# Patient Record
Sex: Male | Born: 1955 | Race: White | Hispanic: No | Marital: Single | Smoking: Former smoker
Health system: Southern US, Community
[De-identification: ages and names within clinical notes are randomized; demographics above are authoritative.]

## PROBLEM LIST (undated history)

## (undated) DIAGNOSIS — F32A Depression, unspecified: Secondary | ICD-10-CM

## (undated) DIAGNOSIS — I509 Heart failure, unspecified: Secondary | ICD-10-CM

## (undated) DIAGNOSIS — D6859 Other primary thrombophilia: Secondary | ICD-10-CM

## (undated) DIAGNOSIS — I639 Cerebral infarction, unspecified: Secondary | ICD-10-CM

## (undated) DIAGNOSIS — I10 Essential (primary) hypertension: Secondary | ICD-10-CM

## (undated) DIAGNOSIS — E785 Hyperlipidemia, unspecified: Secondary | ICD-10-CM

## (undated) DIAGNOSIS — F329 Major depressive disorder, single episode, unspecified: Secondary | ICD-10-CM

## (undated) DIAGNOSIS — K921 Melena: Secondary | ICD-10-CM

## (undated) DIAGNOSIS — D649 Anemia, unspecified: Secondary | ICD-10-CM

## (undated) DIAGNOSIS — S301XXA Contusion of abdominal wall, initial encounter: Secondary | ICD-10-CM

## (undated) DIAGNOSIS — G40909 Epilepsy, unspecified, not intractable, without status epilepticus: Secondary | ICD-10-CM

## (undated) DIAGNOSIS — I82409 Acute embolism and thrombosis of unspecified deep veins of unspecified lower extremity: Secondary | ICD-10-CM

## (undated) DIAGNOSIS — Q211 Atrial septal defect: Secondary | ICD-10-CM

## (undated) DIAGNOSIS — Q2112 Patent foramen ovale: Secondary | ICD-10-CM

## (undated) DIAGNOSIS — D6862 Lupus anticoagulant syndrome: Secondary | ICD-10-CM

## (undated) DIAGNOSIS — F419 Anxiety disorder, unspecified: Secondary | ICD-10-CM

## (undated) HISTORY — DX: Patent foramen ovale: Q21.12

## (undated) HISTORY — DX: Anemia, unspecified: D64.9

## (undated) HISTORY — DX: Morbid (severe) obesity due to excess calories: E66.01

## (undated) HISTORY — DX: Depression, unspecified: F32.A

## (undated) HISTORY — DX: Major depressive disorder, single episode, unspecified: F32.9

## (undated) HISTORY — DX: Hyperlipidemia, unspecified: E78.5

## (undated) HISTORY — DX: Other primary thrombophilia: D68.59

## (undated) HISTORY — DX: Epilepsy, unspecified, not intractable, without status epilepticus: G40.909

## (undated) HISTORY — PX: OTHER SURGICAL HISTORY: SHX169

## (undated) HISTORY — DX: Anxiety disorder, unspecified: F41.9

## (undated) HISTORY — DX: Atrial septal defect: Q21.1

## (undated) HISTORY — DX: Contusion of abdominal wall, initial encounter: S30.1XXA

## (undated) HISTORY — DX: Lupus anticoagulant syndrome: D68.62

## (undated) HISTORY — DX: Melena: K92.1

## (undated) HISTORY — DX: Essential (primary) hypertension: I10

---

## 1988-06-09 DIAGNOSIS — D6862 Lupus anticoagulant syndrome: Secondary | ICD-10-CM

## 1988-06-09 HISTORY — DX: Lupus anticoagulant syndrome: D68.62

## 2001-04-26 ENCOUNTER — Emergency Department (HOSPITAL_COMMUNITY): Admission: EM | Admit: 2001-04-26 | Discharge: 2001-04-26 | Payer: Self-pay

## 2002-03-17 ENCOUNTER — Inpatient Hospital Stay (HOSPITAL_COMMUNITY): Admission: EM | Admit: 2002-03-17 | Discharge: 2002-03-21 | Payer: Self-pay | Admitting: Emergency Medicine

## 2002-03-17 ENCOUNTER — Encounter: Payer: Self-pay | Admitting: Emergency Medicine

## 2003-05-07 ENCOUNTER — Emergency Department (HOSPITAL_COMMUNITY): Admission: EM | Admit: 2003-05-07 | Discharge: 2003-05-07 | Payer: Self-pay | Admitting: Emergency Medicine

## 2008-06-09 DIAGNOSIS — I639 Cerebral infarction, unspecified: Secondary | ICD-10-CM

## 2008-06-09 HISTORY — DX: Cerebral infarction, unspecified: I63.9

## 2008-07-15 ENCOUNTER — Ambulatory Visit: Payer: Self-pay | Admitting: Internal Medicine

## 2008-07-15 ENCOUNTER — Inpatient Hospital Stay (HOSPITAL_COMMUNITY): Admission: EM | Admit: 2008-07-15 | Discharge: 2008-07-31 | Payer: Self-pay | Admitting: Emergency Medicine

## 2008-07-15 ENCOUNTER — Encounter (INDEPENDENT_AMBULATORY_CARE_PROVIDER_SITE_OTHER): Payer: Self-pay | Admitting: General Surgery

## 2008-07-17 ENCOUNTER — Ambulatory Visit: Payer: Self-pay | Admitting: Physical Medicine & Rehabilitation

## 2008-07-18 ENCOUNTER — Encounter (INDEPENDENT_AMBULATORY_CARE_PROVIDER_SITE_OTHER): Payer: Self-pay | Admitting: Neurology

## 2008-07-20 ENCOUNTER — Ambulatory Visit: Payer: Self-pay | Admitting: Vascular Surgery

## 2008-07-20 ENCOUNTER — Encounter (INDEPENDENT_AMBULATORY_CARE_PROVIDER_SITE_OTHER): Payer: Self-pay | Admitting: Neurology

## 2008-08-09 ENCOUNTER — Ambulatory Visit: Payer: Self-pay | Admitting: Oncology

## 2008-08-25 LAB — BETA-2 GLYCOPROTEIN ANTIBODIES
Beta-2-Glycoprotein I IgA: 9 U/mL (ref <10)
Beta-2-Glycoprotein I IgM: <4 U/mL (ref <10)

## 2008-08-25 LAB — LUPUS ANTICOAGULANT PANEL: PTT Lupus Anticoagulant: 39.2 secs (ref 36.3–48.8)

## 2008-08-25 LAB — PROTHROMBIN TIME: INR: 1.7 — ABNORMAL HIGH (ref 0.0–1.5)

## 2008-09-14 ENCOUNTER — Ambulatory Visit (HOSPITAL_COMMUNITY): Admission: RE | Admit: 2008-09-14 | Discharge: 2008-09-14 | Payer: Self-pay | Admitting: Internal Medicine

## 2008-10-04 ENCOUNTER — Ambulatory Visit (HOSPITAL_COMMUNITY): Admission: RE | Admit: 2008-10-04 | Discharge: 2008-10-04 | Payer: Self-pay | Admitting: Internal Medicine

## 2008-11-16 ENCOUNTER — Ambulatory Visit: Payer: Self-pay | Admitting: Oncology

## 2008-11-21 LAB — PROTIME-INR
INR: 2.3 (ref 2.00–3.50)
Protime: 27.6 Seconds — ABNORMAL HIGH (ref 10.6–13.4)

## 2008-11-24 LAB — CARDIOLIPIN ANTIBODIES, IGG, IGM, IGA
Anticardiolipin IgA: 7 [APL'U] (ref <13)
Anticardiolipin IgM: <7 [MPL'U] (ref <10)

## 2008-11-24 LAB — BETA-2 GLYCOPROTEIN ANTIBODIES
Beta-2-Glycoprotein I IgA: 8 U/mL (ref <10)
Beta-2-Glycoprotein I IgM: <4 U/mL (ref <10)

## 2008-11-24 LAB — LUPUS ANTICOAGULANT PANEL: PTT Lupus Anticoagulant: 43.6 secs (ref 36.3–48.8)

## 2009-11-19 ENCOUNTER — Inpatient Hospital Stay (HOSPITAL_COMMUNITY): Admission: EM | Admit: 2009-11-19 | Discharge: 2009-11-21 | Payer: Self-pay | Admitting: Emergency Medicine

## 2010-08-25 LAB — CBC
HCT: 40.4 % (ref 39.0–52.0)
MCHC: 34.2 g/dL (ref 30.0–36.0)
MCV: 91.8 fL (ref 78.0–100.0)
Platelets: 160 10*3/uL (ref 150–400)
RDW: 13.4 % (ref 11.5–15.5)
WBC: 8.3 10*3/uL (ref 4.0–10.5)

## 2010-08-25 LAB — PROTIME-INR: Prothrombin Time: 21.9 seconds — ABNORMAL HIGH (ref 11.6–15.2)

## 2010-08-26 LAB — POCT I-STAT, CHEM 8
BUN: 14 mg/dL (ref 6–23)
Calcium, Ion: 1.1 mmol/L — ABNORMAL LOW (ref 1.12–1.32)
Creatinine, Ser: 1.1 mg/dL (ref 0.4–1.5)
Hemoglobin: 14.6 g/dL (ref 13.0–17.0)
TCO2: 21 mmol/L (ref 0–100)

## 2010-08-26 LAB — CBC
HCT: 42.2 % (ref 39.0–52.0)
MCHC: 34.3 g/dL (ref 30.0–36.0)
MCV: 91.6 fL (ref 78.0–100.0)
MCV: 92.3 fL (ref 78.0–100.0)
Platelets: 161 10*3/uL (ref 150–400)
Platelets: 163 10*3/uL (ref 150–400)
RBC: 4.73 MIL/uL (ref 4.22–5.81)
RDW: 13.4 % (ref 11.5–15.5)
RDW: 13.5 % (ref 11.5–15.5)

## 2010-08-26 LAB — PROTIME-INR
INR: 1.54 — ABNORMAL HIGH (ref 0.00–1.49)
INR: 1.71 — ABNORMAL HIGH (ref 0.00–1.49)
Prothrombin Time: 19.9 seconds — ABNORMAL HIGH (ref 11.6–15.2)

## 2010-08-26 LAB — URINALYSIS, ROUTINE W REFLEX MICROSCOPIC
Glucose, UA: NEGATIVE mg/dL
Hgb urine dipstick: NEGATIVE
Ketones, ur: NEGATIVE mg/dL
Protein, ur: NEGATIVE mg/dL

## 2010-08-26 LAB — COMPREHENSIVE METABOLIC PANEL
Albumin: 3.4 g/dL — ABNORMAL LOW (ref 3.5–5.2)
BUN: 9 mg/dL (ref 6–23)
Calcium: 8.8 mg/dL (ref 8.4–10.5)
Creatinine, Ser: 0.73 mg/dL (ref 0.4–1.5)
Total Protein: 6 g/dL (ref 6.0–8.3)

## 2010-08-26 LAB — GLUCOSE, CAPILLARY: Glucose-Capillary: 82 mg/dL (ref 70–99)

## 2010-08-26 LAB — APTT: aPTT: 24 seconds (ref 24–37)

## 2010-09-24 LAB — BASIC METABOLIC PANEL
BUN: 11 mg/dL (ref 6–23)
BUN: 16 mg/dL (ref 6–23)
CO2: 24 mEq/L (ref 19–32)
CO2: 24 mEq/L (ref 19–32)
Calcium: 8.3 mg/dL — ABNORMAL LOW (ref 8.4–10.5)
Calcium: 8.6 mg/dL (ref 8.4–10.5)
Calcium: 8.7 mg/dL (ref 8.4–10.5)
Chloride: 105 mEq/L (ref 96–112)
Chloride: 106 mEq/L (ref 96–112)
Creatinine, Ser: 0.75 mg/dL (ref 0.4–1.5)
GFR calc Af Amer: >60 mL/min (ref >60)
GFR calc Af Amer: >60 mL/min (ref >60)
GFR calc Af Amer: >60 mL/min (ref >60)
GFR calc non Af Amer: >60 mL/min (ref >60)
GFR calc non Af Amer: >60 mL/min (ref >60)
GFR calc non Af Amer: >60 mL/min (ref >60)
GFR calc non Af Amer: >60 mL/min (ref >60)
GFR calc non Af Amer: >60 mL/min (ref >60)
GFR calc non Af Amer: >60 mL/min (ref >60)
Glucose, Bld: 105 mg/dL — ABNORMAL HIGH (ref 70–99)
Glucose, Bld: 115 mg/dL — ABNORMAL HIGH (ref 70–99)
Glucose, Bld: 129 mg/dL — ABNORMAL HIGH (ref 70–99)
Potassium: 3.4 mEq/L — ABNORMAL LOW (ref 3.5–5.1)
Potassium: 3.5 mEq/L (ref 3.5–5.1)
Potassium: 3.5 mEq/L (ref 3.5–5.1)
Potassium: 3.6 mEq/L (ref 3.5–5.1)
Potassium: 3.7 mEq/L (ref 3.5–5.1)
Potassium: 3.8 mEq/L (ref 3.5–5.1)
Sodium: 136 mEq/L (ref 135–145)
Sodium: 138 mEq/L (ref 135–145)
Sodium: 140 mEq/L (ref 135–145)
Sodium: 140 mEq/L (ref 135–145)

## 2010-09-24 LAB — DIFFERENTIAL
Basophils Absolute: 0 10*3/uL (ref 0.0–0.1)
Basophils Absolute: 0.1 10*3/uL (ref 0.0–0.1)
Basophils Absolute: 0 10*3/uL (ref 0.0–0.1)
Basophils Relative: 0 % (ref 0–1)
Eosinophils Relative: 0 % (ref 0–5)
Eosinophils Relative: 1 % (ref 0–5)
Eosinophils Relative: 1 % (ref 0–5)
Eosinophils Relative: 4 % (ref 0–5)
Lymphocytes Relative: 12 % (ref 12–46)
Lymphocytes Relative: 5 % — ABNORMAL LOW (ref 12–46)
Lymphocytes Relative: 8 % — ABNORMAL LOW (ref 12–46)
Lymphocytes Relative: 8 % — ABNORMAL LOW (ref 12–46)
Lymphocytes Relative: 9 % — ABNORMAL LOW (ref 12–46)
Lymphocytes Relative: 10 % — ABNORMAL LOW (ref 12–46)
Lymphocytes Relative: 9 % — ABNORMAL LOW (ref 12–46)
Lymphs Abs: 0.8 10*3/uL (ref 0.7–4.0)
Lymphs Abs: 0.8 10*3/uL (ref 0.7–4.0)
Lymphs Abs: 1 10*3/uL (ref 0.7–4.0)
Lymphs Abs: 1.2 10*3/uL (ref 0.7–4.0)
Monocytes Absolute: 0.5 10*3/uL (ref 0.1–1.0)
Monocytes Absolute: 0.6 10*3/uL (ref 0.1–1.0)
Monocytes Absolute: 1 10*3/uL (ref 0.1–1.0)
Monocytes Relative: 5 % (ref 3–12)
Monocytes Relative: 6 % (ref 3–12)
Monocytes Relative: 7 % (ref 3–12)
Monocytes Relative: 8 % (ref 3–12)
Neutro Abs: 7.8 10*3/uL — ABNORMAL HIGH (ref 1.7–7.7)
Neutro Abs: 9 10*3/uL — ABNORMAL HIGH (ref 1.7–7.7)
Neutro Abs: 9.7 10*3/uL — ABNORMAL HIGH (ref 1.7–7.7)
Neutro Abs: 8.8 10*3/uL — ABNORMAL HIGH (ref 1.7–7.7)
Neutro Abs: 9.5 10*3/uL — ABNORMAL HIGH (ref 1.7–7.7)
Neutrophils Relative %: 84 % — ABNORMAL HIGH (ref 43–77)
Neutrophils Relative %: 85 % — ABNORMAL HIGH (ref 43–77)
Neutrophils Relative %: 86 % — ABNORMAL HIGH (ref 43–77)
Neutrophils Relative %: 77 % (ref 43–77)
Neutrophils Relative %: 79 % — ABNORMAL HIGH (ref 43–77)

## 2010-09-24 LAB — CBC
HCT: 34 % — ABNORMAL LOW (ref 39.0–52.0)
HCT: 35.2 % — ABNORMAL LOW (ref 39.0–52.0)
HCT: 37.7 % — ABNORMAL LOW (ref 39.0–52.0)
HCT: 40.7 % (ref 39.0–52.0)
HCT: 42.3 % (ref 39.0–52.0)
HCT: 42.5 % (ref 39.0–52.0)
Hemoglobin: 11.9 g/dL — ABNORMAL LOW (ref 13.0–17.0)
Hemoglobin: 13.2 g/dL (ref 13.0–17.0)
MCHC: 34.8 g/dL (ref 30.0–36.0)
MCHC: 35 g/dL (ref 30.0–36.0)
MCHC: 35.1 g/dL (ref 30.0–36.0)
MCHC: 35 g/dL (ref 30.0–36.0)
MCV: 90.4 fL (ref 78.0–100.0)
MCV: 89.3 fL (ref 78.0–100.0)
Platelets: 133 10*3/uL — ABNORMAL LOW (ref 150–400)
Platelets: 154 10*3/uL (ref 150–400)
Platelets: 171 10*3/uL (ref 150–400)
Platelets: 184 10*3/uL (ref 150–400)
Platelets: 232 10*3/uL (ref 150–400)
Platelets: 312 10*3/uL (ref 150–400)
RBC: 3.82 MIL/uL — ABNORMAL LOW (ref 4.22–5.81)
RBC: 4.07 MIL/uL — ABNORMAL LOW (ref 4.22–5.81)
RBC: 4.45 MIL/uL (ref 4.22–5.81)
RDW: 13.2 % (ref 11.5–15.5)
RDW: 13.4 % (ref 11.5–15.5)
RDW: 13.5 % (ref 11.5–15.5)
RDW: 13.8 % (ref 11.5–15.5)
WBC: 10.4 10*3/uL (ref 4.0–10.5)
WBC: 10.6 10*3/uL — ABNORMAL HIGH (ref 4.0–10.5)
WBC: 11.6 10*3/uL — ABNORMAL HIGH (ref 4.0–10.5)
WBC: 11.4 10*3/uL — ABNORMAL HIGH (ref 4.0–10.5)

## 2010-09-24 LAB — GLUCOSE, CAPILLARY
Glucose-Capillary: 102 mg/dL — ABNORMAL HIGH (ref 70–99)
Glucose-Capillary: 103 mg/dL — ABNORMAL HIGH (ref 70–99)
Glucose-Capillary: 110 mg/dL — ABNORMAL HIGH (ref 70–99)
Glucose-Capillary: 112 mg/dL — ABNORMAL HIGH (ref 70–99)
Glucose-Capillary: 112 mg/dL — ABNORMAL HIGH (ref 70–99)
Glucose-Capillary: 112 mg/dL — ABNORMAL HIGH (ref 70–99)
Glucose-Capillary: 118 mg/dL — ABNORMAL HIGH (ref 70–99)
Glucose-Capillary: 118 mg/dL — ABNORMAL HIGH (ref 70–99)
Glucose-Capillary: 120 mg/dL — ABNORMAL HIGH (ref 70–99)
Glucose-Capillary: 123 mg/dL — ABNORMAL HIGH (ref 70–99)
Glucose-Capillary: 126 mg/dL — ABNORMAL HIGH (ref 70–99)
Glucose-Capillary: 126 mg/dL — ABNORMAL HIGH (ref 70–99)
Glucose-Capillary: 147 mg/dL — ABNORMAL HIGH (ref 70–99)
Glucose-Capillary: 98 mg/dL (ref 70–99)

## 2010-09-24 LAB — URINALYSIS, ROUTINE W REFLEX MICROSCOPIC
Bilirubin Urine: NEGATIVE
Bilirubin Urine: NEGATIVE
Glucose, UA: NEGATIVE mg/dL
Ketones, ur: NEGATIVE mg/dL
Protein, ur: NEGATIVE mg/dL
Specific Gravity, Urine: 1.014 (ref 1.005–1.030)
Urobilinogen, UA: 1 mg/dL (ref 0.0–1.0)
pH: 5.5 (ref 5.0–8.0)

## 2010-09-24 LAB — PROTIME-INR
INR: 1 (ref 0.00–1.49)
INR: 1.8 — ABNORMAL HIGH (ref 0.00–1.49)
INR: 1.6 — ABNORMAL HIGH (ref 0.00–1.49)
INR: 1.7 — ABNORMAL HIGH (ref 0.00–1.49)
INR: 1.8 — ABNORMAL HIGH (ref 0.00–1.49)
INR: 2.1 — ABNORMAL HIGH (ref 0.00–1.49)
INR: 2.6 — ABNORMAL HIGH (ref 0.00–1.49)
INR: 2.8 — ABNORMAL HIGH (ref 0.00–1.49)
INR: 3.5 — ABNORMAL HIGH (ref 0.00–1.49)
Prothrombin Time: 13.9 seconds (ref 11.6–15.2)
Prothrombin Time: 22.2 seconds — ABNORMAL HIGH (ref 11.6–15.2)
Prothrombin Time: 25.1 seconds — ABNORMAL HIGH (ref 11.6–15.2)
Prothrombin Time: 19.8 seconds — ABNORMAL HIGH (ref 11.6–15.2)
Prothrombin Time: 21.1 seconds — ABNORMAL HIGH (ref 11.6–15.2)
Prothrombin Time: 24.7 seconds — ABNORMAL HIGH (ref 11.6–15.2)
Prothrombin Time: 37.9 seconds — ABNORMAL HIGH (ref 11.6–15.2)

## 2010-09-24 LAB — LACTIC ACID, PLASMA: Lactic Acid, Venous: 3.1 mmol/L — ABNORMAL HIGH (ref 0.5–2.2)

## 2010-09-24 LAB — COMPREHENSIVE METABOLIC PANEL
ALT: 20 U/L (ref 0–53)
Alkaline Phosphatase: 50 U/L (ref 39–117)
BUN: 16 mg/dL (ref 6–23)
Chloride: 111 mEq/L (ref 96–112)
Glucose, Bld: 115 mg/dL — ABNORMAL HIGH (ref 70–99)
Potassium: 4.1 mEq/L (ref 3.5–5.1)
Total Bilirubin: 0.4 mg/dL (ref 0.3–1.2)

## 2010-09-24 LAB — C4 COMPLEMENT: Complement C4, Body Fluid: 19 mg/dL (ref 16–47)

## 2010-09-24 LAB — HOMOCYSTEINE: Homocysteine: 21.1 umol/L — ABNORMAL HIGH (ref 4.0–15.4)

## 2010-09-24 LAB — URINE CULTURE
Colony Count: NO GROWTH
Culture: NO GROWTH
Culture: NO GROWTH

## 2010-09-24 LAB — COMPLEMENT, TOTAL: Compl, Total (CH50): 54 U/mL (ref 31–66)

## 2010-09-24 LAB — LUPUS ANTICOAGULANT PANEL
DRVVT: 145 secs — ABNORMAL HIGH (ref 36.1–47.0)
Lupus Anticoagulant: DETECTED — AB
PTTLA 4:1 Mix: 120.4 secs — ABNORMAL HIGH (ref 36.3–48.8)
dRVVT Incubated 1:1 Mix: 64.8 secs — ABNORMAL HIGH (ref 36.1–47.0)

## 2010-09-24 LAB — C3 COMPLEMENT: C3 Complement: 85 mg/dL — ABNORMAL LOW (ref 88–201)

## 2010-09-24 LAB — HEMOGLOBIN A1C
Hgb A1c MFr Bld: 5 % (ref 4.6–6.1)
Mean Plasma Glucose: 97 mg/dL

## 2010-09-24 LAB — CARDIOLIPIN ANTIBODIES, IGG, IGM, IGA
Anticardiolipin IgA: 14 [APL'U] (ref <13)
Anticardiolipin IgM: <7 [MPL'U] — ABNORMAL LOW (ref <10)

## 2010-09-24 LAB — PROTEIN C, TOTAL: Protein C, Total: 41 % — ABNORMAL LOW (ref 70–140)

## 2010-09-24 LAB — RAPID URINE DRUG SCREEN, HOSP PERFORMED
Cocaine: NOT DETECTED
Opiates: NOT DETECTED

## 2010-09-24 LAB — POCT I-STAT, CHEM 8
HCT: 41 % (ref 39.0–52.0)
Hemoglobin: 13.9 g/dL (ref 13.0–17.0)
Potassium: 3.6 mEq/L (ref 3.5–5.1)
Sodium: 141 mEq/L (ref 135–145)

## 2010-09-24 LAB — APTT: aPTT: 22 seconds — ABNORMAL LOW (ref 24–37)

## 2010-09-24 LAB — FACTOR 5 LEIDEN

## 2010-09-24 LAB — PROTEIN C ACTIVITY: Protein C Activity: 69 % — ABNORMAL LOW (ref 75–133)

## 2010-09-24 LAB — LIPID PANEL: Cholesterol: 155 mg/dL (ref 0–200)

## 2010-09-24 LAB — PROTEIN S ACTIVITY: Protein S Activity: 166 % — ABNORMAL HIGH (ref 69–129)

## 2010-09-24 LAB — URINE MICROSCOPIC-ADD ON

## 2010-09-24 LAB — CARDIAC PANEL(CRET KIN+CKTOT+MB+TROPI)
CK, MB: 1.9 ng/mL (ref 0.3–4.0)
CK, MB: 2.3 ng/mL (ref 0.3–4.0)
Total CK: 294 U/L — ABNORMAL HIGH (ref 7–232)

## 2010-10-22 NOTE — Discharge Summary (Signed)
NAME:  James Villegas, James Villegas                ACCOUNT NO.:  [id number]   MEDICAL RECORD NO.:  [id number]          PATIENT TYPE:  INP   LOCATION:  3022                         FACILITY:  [facility]   PHYSICIAN:  Hollice Espy, M.D.DATE OF BIRTH:  1955-07-10   DATE OF ADMISSION:  07/15/2008  DATE OF DISCHARGE:                               DISCHARGE SUMMARY   ADDENDUM   This addendum covers events from July 26, 2008 to July 28, 2008.  Please see the previous discharge summary covering events from the day  of admission up until July 26, 2008.   Addendum is as follows:  The patient was felt to be medically stable for  discharge.  He did sustain a fall when he fell out of his chair while  reaching for his TV remote on July 27, 2008.  He sustained a very  small 4-mm laceration right below his right eyebrow which initially had  some mild bleeding, however, with pressure this resolved.  Because of  him being on Coumadin and complaining of some neck pain, we ordered a CT  scan of his cervical spine as well as of his head, both of which were  negative for any type of acute bleed or fracture of the spine.  They did  show evolution of his infarct, which was to be expected given his  previous recent stroke.  The patient was also complaining of some left  shoulder pain.  A two-view shoulder x-ray was done, which showed no  evidence of any acute fracture-dislocation, and was felt to be otherwise  normal.  The patient is still felt to be medically stable for discharge.  He is currently doing well.  He says his pain is much better and the  plan will be for him to be discharged to a skilled nursing facility once  payer source has been identified and the patient is stable.      Hollice Espy, M.D.  Electronically Signed     SKK/MEDQ  D:  07/28/2008  T:  07/28/2008  Job:  [id number]   cc:   Primary Care Physician

## 2010-10-22 NOTE — H&P (Signed)
NAME:  James Villegas, James Villegas NO.:  [id number]   MEDICAL RECORD NO.:  [id number]          PATIENT TYPE:  INP   LOCATION:  1823                         FACILITY:  [facility]   PHYSICIAN:  Ramiro Harvest, MD    DATE OF BIRTH:  August 18, 1955   DATE OF ADMISSION:  07/15/2008  DATE OF DISCHARGE:                              HISTORY & PHYSICAL   PRIMARY CARE PHYSICIAN:  Dr. Manus Gunning of [facility].   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  Per  patient, and ED charts and friends the patient was last seen well around  noon one day prior to admission.  The patient called his friend this  morning around 10:00 a.m., was very forgetful and kept calling back.  The patient had some slurred speech as well.  He had wanted his friend  to call another friend, however, could not remember the friend's phone  number.  The patient's friend finally was able to get in contact with  patient's other friend, called EMS and they went to the patient's home.  They were unable to get in as the patient was unable to get to the door.  EMS broke in to the patient's home, found patient on the floor in the  bedroom with a right paralysis, facial droop and slurred speech and  patient was brought to the emergency department.  The patient denies any  trauma or fall, stated that he just laid on the floor.  The patient  denied any fever, no chills, no nausea, no vomiting, no emesis, no chest  pain, no shortness of breath, no abdominal pain, no melena or  hematemesis, no hematochezia, no cough, no dysuria, no other associated  symptoms.  The patient was brought into the ED and at around 9:25 in the  morning the patient became unresponsive and per ED records was staring  with his heart rate dropping to 30s to the 40s with a brief episode of  asystole  and hypotension with blood pressure of 84/60.  The patient was  given 0.5 mg of atropine IV push with response in his blood pressure and  heart rate.  The patient was then transported to CT where he had some  nausea and emesis and also s drop in his pressure of about 63/48.  The  patient was given some IV fluids.  His pressure responded to 121/75.  Head CT done in the ED showed a probable early left MCA territory  ischemic stroke.  BMET obtained in the ED was within normal limits.  CBC  was within normal limits.  Coags was within normal limits.  Chest x-ray  with bibasilar atelectasis.  Neurology was consulted and we were asked  to admit the patient for further evaluation and recommendations.   ALLERGIES:  NO KNOWN DRUG ALLERGIES.   PAST MEDICAL HISTORY:  Obtained from eChart:  1. History of bilateral PEs in March 17, 2002.  2. Hyperhomocysteinemia  3. Depression/anxiety.  4.  Hyperlipidemia.   HOME MEDICATIONS:  The patient is on Celexa 40 mg p.o. daily, trazodone  dose unknown and pravastatin dose unknown.   SOCIAL HISTORY:  The patient lives alone, is single.  No tobacco use.  No alcohol use.  No IV drug use.   FAMILY HISTORY:  Father deceased from suicide.  Mother deceased from a  brain tumor.  Has one sister who is alive and healthy at age 46.   REVIEW OF SYSTEMS:  As per HPI, otherwise negative.   PHYSICAL EXAM:  Temperature 97.6, blood pressure 119/77 dropping down to  63/48, back up to 121/75, pulse of 77, respiratory rate 16, saturating  99% on 5 liters nasal cannula.  GENERAL:  The patient is drowsy but arousable to verbal stimuli with  some slurred speech, has a C-collar on.  HEENT:  Normocephalic/atraumatic, pupils equally round and reactive to  light and accommodation.  The patient with a left gaze preference, right  facial droop.  NECK:  Supple, no lymphadenopathy, mildly dry mucous membranes.  RESPIRATORY:  Lungs are clear to auscultation bilaterally in the   anterior lung fields.  CARDIOVASCULAR:  Regular rate and rhythm.  No murmurs, rubs or gallops.  ABDOMEN:  Soft, nontender, nondistended, positive bowel sounds.  EXTREMITIES:  No clubbing, cyanosis or edema.  NEUROLOGICAL:  The patient is lethargic, arouses to verbal stimuli, is  following commands, has a left gaze preference, a right hemiplegia,  right visual field defect and expressive aphasia, 5/5 left upper and  left lower extremity strength.  Decreased sensation on the right.  Two  plus reflexes in the radial, brachial radialis and patella on the left,  increased reflexes on the right.  Unequivocal Babinski.  Gait not tested  secondary to safety.   ADMISSION LABS:  CBC with a white count of 10.4, hemoglobin 14,  hematocrit 40.7, platelet count of 171, ANC of 8.9, PT of 13.9, INR of  1, PTT of 22.  Basic metabolic profile:  Sodium 136, potassium 3.5,  chloride 106, bicarb 23, glucose 145, BUN 16, creatinine 0.89 and a  calcium of 8.3.  UDS done, was negative.  Chest x-ray was low lung  volumes with bibasilar atelectasis.  If the patient's symptoms continue  consider PA and lateral chest x-ray, obtain full inspiration while the  patient is clinically able to.  CT of the head without contrast showed  indistinctness in the left external capsule and parietal lobe gray white  junction, indicate acute MCA territory infarct.   ASSESSMENT AND PLAN:  James Villegas is a 55 year old gentleman with a  history of bilateral PEs, hyperhomocysteinemia, depression/anxiety.  Presented to the ED with a right-sided paralysis, expressive aphasia as  well as slurred speech.   1. Acute left ischemic cerebrovascular accident per CT, likely in the      middle cerebral artery territory.  Will admit to the intensive care      unit, will check an MRI, MRA of the head and neck, check carotid      Dopplers, check a 2D echo, check a fasting lipid panel, check      homocystine level, check a urinalysis with  cultures and      sensitivity, check a hemoglobin A1c, check a comprehensive      metabolic profile, check an alcohol level.  Cycle cardiac enzymes      q.8 hours x3.  Place on aspirin rectally as the patient failed a      bedside swallow evaluation.  Physical therapy,  occupational      therapy, speech therapy.  Neurology is following and appreciate      their input and recommendations.  2. Bradycardia, questionable etiology.  Check a TSH, check cardiac      enzymes q.8 hours x3, check a 2D echo.  Atropine to bedside.      Consult with neurology for further evaluation and recommendations.  3. Hypertension.  Differential includes cardiogenic versus hypovolemic      versus septic which is a low likelihood as patient is afebrile with      a normal white count.  Chest x-ray is normal versus hemorrhagic      which is unlikely with a normal hemoglobin and hematocrit and no      evidence of gastrointestinal bleed.  Will check cardiac enzymes q.8      hours x3, check a 2D echo, check a urinalysis with cultures and      sensitivities.  Repeat chest x-ray in the morning to rule out an      aspiration pneumonia.  Follow hemoglobin and hematocrit, check a      lipase level, check a lactic acid level.  Will also consulted with      CCM for further evaluation and recommendation.  4. Hyperlipidemia.  Check a fasting lipid panel, will hold off on his      pravastatin for now until the patient has passed a swallow      evaluation.  5. Depression.  Will hold onto antidepressant medications for now      until the patient passes a swallow evaluation.  6. History of bilateral pulmonary embolisms on March 17, 2002.  Takes      Protonix for gastrointestinal prophylaxis, Lovenox for deep venous      thrombosis prophylaxis.   It has been a pleasure taking care of [name].      Ramiro Harvest, MD  Electronically Signed     DT/MEDQ  D:  07/15/2008  T:  07/15/2008  Job:  [id number]   cc:   Bryan Lemma. Manus Gunning, M.D.

## 2010-10-22 NOTE — Discharge Summary (Signed)
NAME:  James Villegas, James Villegas                ACCOUNT NO.:  [id number]   MEDICAL RECORD NO.:  [id number]          PATIENT TYPE:  INP   LOCATION:  3022                         FACILITY:  [facility]   PHYSICIAN:  Kela Millin, M.D.DATE OF BIRTH:  Aug 07, 1955   DATE OF ADMISSION:  07/15/2008  DATE OF DISCHARGE:                               DISCHARGE SUMMARY   This interim discharge summary spans the period from July 15, 2008  through July 26, 2008.   DISCHARGE DIAGNOSES:  1. 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  2. Bradycardia with hypotension - per cardiology vagal in nature and      responded to atropine - no pacemaker needed.  3. Right lower extremity deep vein thrombosis.  4. Hypertension.  5. Positive lupus anticoagulant.  6. decreased total protein C and decreased protein C functional level.  7. History of hyperlipidemia.  8. History of depression/anxiety.  9. History of elevated homocysteine level.  10.History of bilateral pulmonary embolisms in October 2003.  11.Patent foramen ovale with moderate right-to-left shunting at rest      on bubble.  12.Dysphagia.   PROCEDURES/STUDIES:  1. Acute infarction throughout the left middle cerebral artery      territory.  Swelling of the infarcted brain with 1-mm of left-to-      right shift.  Petechial blood products in the region of the      infarction.  No discrete hematoma.  2. MRA - abrupt occlusion of the left middle cerebral artery 1.5 cm      beyond its origin, consistent with embolic disease.  No other      intracranial lesion identified.  3. MRA of neck - negative.  4. Follow-up CT scan of head - evolutionary change in the large left      MCA territory infarction with approximately 7.8-mm of left-to-right      shift.  No hemorrhage seen.  5. Two-D echocardiogram - overall left ventricular systolic function      normal with the left ventricular ejection fraction estimated to  be      60%, no left ventricular regional wall motion abnormalities noted.  6. TEE - With bubble study - Intra-atrial septum with moderate right-      to-left shunting at rest on bubble.  Positive septal aneurysm with      intermittent opening of moderate sized patent foramen ovale.   CONSULTATIONS:  1. Neurology - Dr. Pearlean Brownie.  2. Cardiology - Dr. Deborah Chalk, and TEE/bubble study done by [facility]      [facility].   BRIEF HISTORY:  The patient is a 55 year old white male with the above-  listed medical problems who presented with right-sided hemiplegia.  It  was reported that the patient had been last seen by friends around noon  on the day prior to admission.  He called his friend on the morning of  admission and was very forgetful and kept calling back and it was also  noted that he had slurred speech.  The friend finally called EMS and  they went  to patient's home.  They were unable to get in and had to  break again.  They found the patient on the floor of his bedroom with  right-sided paralysis, a facial droop and he was brought to the ER.  In  the ER, the patient became unresponsive and his heart rate dropped to  the 30s/40s with a brief episode of asystole and he was hypotensive with  a blood pressure of 84/60.  He was given 0.5 of atropine IV and his  blood pressures and heart rate responded to that.  A CT scan of his head  was done which showed a probable early left MCA territory ischemic  stroke.  Neurology was consulted and he was admitted for further  evaluation and management.   Please see the dictated admission history and physical for the details  of the admission physical exam as well as the laboratory data.   HOSPITAL COURSE:  1. Large left MCA and small distal anterior cerebral artery infarct -      upon admission a CT scan was done and it showed a probable early      left MCA territory ischemic infarct.  A follow-up MRA was done and      the results are as stated above.   The patient was placed on rectal      aspirin initially.  Workup included carotid Doppler ultrasound      which showed no ICA stenosis.  It was noted that the right ICA had      echoes and the distal CCA and origin of the ICA with atypical ICA      signal.  Vascular surgery was consulted regarding this and they      recommended medical treatment, and stated that there was no      evidence of carotid dissection.  They also stated that he did not      need any MR or CT of the chest.  Also, a fasting lipid profile was      done, the total cholesterol was 155 with HDL of 40 and LDL of 104.      The patient was maintained on Zocor during his hospital stay.  A      homocysteine level was also done and it was within normal limits at      5.3.  Neurology recommended placing him on Foltx and he is to      continue this upon discharge as well.  Workup also included lab for      vasculitis and these were all within normal limits.  A      hypercoagulable panel was done and the antithrombin-3 was elevated      at 134.  He was positive for lupus anticoagulant and his total      protein C and protein C functional levels were low.  The patient      was also found to have a DVT during this hospital stay.  Once he      was able to tolerate p.o., he had been started on Aggrenox per      neurology.  With the hypercoagulable panel results as above as well      as the finding of a DVT, neurology recommended to start patient on      Coumadin with no bridging of Lovenox/heparin to avoid any      hemorrhagic conversion.  They recommended that once the patient was      therapeutic on the Coumadin to  discontinue the Aggrenox.  This was      done and the patient has now been maintained on Coumadin with      continued monitoring of his INR.  Neurology indicated that the      patient would subsequently need CardioNet monitor and transcranial      Doppler bubble study.  He is to follow up with Dr. Pearlean Brownie       outpatient.  Neurology also recommended outpatient hematology      referral.  2. Dysphagia - speech therapy was consulted and they evaluated his      swallowing, he was noted to have dysphagia and initially dysphagia-      1 diet was recommended, subsequently the diet was advanced to a      dysphagia-2 diet but on follow-up it was noted that he had severe      pocketing of the dysphagia to solids.  At that time, the patient      was then downgraded to a dysphagia-1  diet which she is to continue      upon discharge and follow up with speech therapy.  3. Bradycardia with hypotension - as discussed above, this responded      to atropine.  Cardiology saw the patient and their impression was      that this was likely secondary to his stroke.  They recommended      supportive care and p.r.n. atropine.  A 2-D echocardiogram was      obtained to look for a source of embolus and the results are as      stated above.  He also had the TEE with bubble study and the      results are as stated above.  4. Right lower extremity deep vein thrombosis - as discussed above,      patient was started on Coumadin.  Hypercoagulable panel revealed a      positive lupus anticoagulant as well as decreased protein C levels.      The recommendation is for the patient to stay on Coumadin long-      term.  5. History of insomnia - the patient was maintained on his trazodone      during his hospital stay.  6. History of hyperlipidemia - he is to continue his Pravastatin upon      discharge.  7. History of anxiety and depression - he was maintained on Citalopram      during his hospital stay and he is to continue this upon discharge.  8. Hypertension - the patient's blood pressures were persistently      elevated during his hospital stay, after the acute period he was      subsequently started on Norvasc.  He is to continue this upon      discharge.   DISCHARGE MEDICATIONS:  1. Norvasc 5 mg p.o. daily.  2. Celexa  40 mg p.o. daily.  3. Protonix 40 mg p.o. b.i.d.  4. Pravastatin 40 mg p.o. daily.  5. Trazodone changed to 150 mg p.o. nightly.  6. Citalopram 40 mg p.o. daily.  7. Foltx 1 p.o. daily.  8. Coumadin, dose as directed per nursing home physician/pharmacist.  9. Tylenol 650 mg p.o. q.4-6 h p.r.n.   DISCHARGE DIET:  Dysphagia-1 diet.   FOLLOW-UP CARE:  1. Dr. Pearlean Brownie, call [phone] for appointment, follow-up for      transcranial Doppler bubble study and CardioNet monitor.  2. Outpatient hematology referral.  3. Nursing home physician in 1-2  days for PT/INR and continued      adjustment of Coumadin dose as appropriate.   DISCHARGE CONDITION:  Improved/stable.      Kela Millin, M.D.  Electronically Signed     ACV/MEDQ  D:  07/27/2008  T:  07/27/2008  Job:  [id number]   cc:   Bryan Lemma. Manus Gunning, M.D.  Pramod P. Pearlean Brownie, MD  Bevelyn Buckles. Bensimhon, MD

## 2010-10-22 NOTE — Consult Note (Signed)
NAME:  James Villegas, James Villegas NO.:  [id number]   MEDICAL RECORD NO.:  [id number]          PATIENT TYPE:  INP   LOCATION:  3105                         FACILITY:  [facility]   PHYSICIAN:  Noel Christmas, MD    DATE OF BIRTH:  [birth year]   DATE OF CONSULTATION:  07/15/2008  DATE OF DISCHARGE:                                 CONSULTATION   REFERRING PHYSICIAN:  Hassan Buckler. Weldon Inches, MD, [facility]  [facility].   REASON FOR CONSULTATION:  Acute right paralysis, most likely acute  stroke.   HISTORY OF PRESENT ILLNESS:  This is a 55 year old man with acute onset  of paralysis and slurred speech, found on the floor of his home after an  unknown time by EMS.  The patient was last seen well about noon  yesterday.  There is no previous history of stroke.  CT scan of his head  showed findings indicative of probable early left middle cerebral artery  territory ischemic stroke.  The patient has a history of DVT as well as  elevated homocysteine level.  At one point, he was on Coumadin.  He has  not been on Coumadin anytime recently.  INR today was 1.0.   PAST MEDICAL HISTORY:  Remarkable for DVT and elevated homocysteine  level as indicated above, in addition he has a history of depression and  anxiety.   CURRENT MEDICATIONS:  Trazodone and Celexa.   FAMILY HISTORY:  Unavailable.   PHYSICAL EXAMINATION:  GENERAL:  Appearance was that of a middle aged  man who was slightly overweight.  He was lethargic, but easy to arouse.  Speech was garbled and unintelligible except for simple single syllable  responses.  He followed commands fairly well.  HEENT:  Pupils were equal and reactive normally to light.  Extraocular  movements were limited to midline and to the left side.  He had a gaze  preference to the left, had a dense right visual field defect as well as  right side neglect, he had right facial numbness, and marked right  facial weakness.  Hearing appeared to be intact  grossly.  Speech was  markedly dysarthric.  EXTREMITIES:  Other exam show paralysis of his right arm and leg.  He  had good strength in left extremities.  Muscle tone was flaccid  throughout.  Deep tendon reflexes were slightly asymmetrical with  increased responses recorded from the right extremities compared to the  left.  Plantar responses were mute.  He had absent sensation to touch  over the entire right side.  There was right-sided neglect as well  regarding extremities.  There is no carotid bruit on either side.   CLINICAL IMPRESSION:  1. Probable acute left middle cerebral artery territory ischemic      cerebral infarction.  2. History of elevated homocysteine level, which may be a factor in      development of acute left middle cerebral artery territory ischemic      cerebral infarction.  3. The patient is currently off anticoagulation, which raises a      questions of possible compliant issue.  RECOMMENDATIONS:  1. Aggrenox, if patient is able to swallow.  Otherwise, rectal aspirin      325 mg per day.  2. MRI and MRA of the brain.  3. MRI of the neck with contrast.  4. Echocardiogram.  5. Physical therapy and occupational therapy as well as speech and      language pathology consults.   Thank you for asking me to evaluate Mr. Treat.      Noel Christmas, MD  Electronically Signed     CS/MEDQ  D:  07/15/2008  T:  07/16/2008  Job:  [id number]   cc:   Hassan Buckler. Weldon Inches, MD

## 2010-10-22 NOTE — Consult Note (Signed)
NAME:  James Villegas, James Villegas                ACCOUNT NO.:  [id number]   MEDICAL RECORD NO.:  [id number]          PATIENT TYPE:  INP   LOCATION:  1823                         FACILITY:  [facility]   PHYSICIAN:  Colleen Can. Deborah Chalk, M.D.DATE OF BIRTH:  1956-04-04   DATE OF CONSULTATION:  07/15/2008  DATE OF DISCHARGE:                                 CONSULTATION   We are asked by Dr. Janee Morn to see James Villegas for evaluation of  profound bradycardia and episode of hypotension.  He is a 55 year old  male who presents with an acute CVA.  He was last seen greater than 12  hours ago.  He did call a friend and they thought that he was in a  stupor relapse from his alcoholism.  His AA sponsor felt that something  else was the issue with slurred speech.  911 was called.  He was brought  to the emergency room.  CT scan done in the emergency room, which  confirmed a left middle cerebral artery perfusion defect without  embolus.  He was felt to be out of the time window for a code stroke.   During evaluation, he had an episode of profound bradycardia and sinus  pause and was treated with atropine with resolution of hypotension.   PAST MEDICAL HISTORY:  Includes a history of alcoholism.  He is a  functioning alcoholic and has been in [place] and has not been drinking for  the last 3 years.  He has a history of depression.  He has a history of  pulmonary emboli in 2003, was on warfarin for a short period of time.  There is no real clear-cut etiology for that episode of shortness of  breath.  He has a history of obesity.   ALLERGIES:  Unknown.   CURRENT MEDICATIONS:  Trazodone and questionable two other medicines.  Addurate list is currently not available.   SOCIAL HISTORY:  He is not married.  He has no children.  He has one  sister who provided some of the information to his sponsor in [place],  [place], who was quite helpful.  No tobacco, no alcohol.  He was laid  off 3 months ago.  He previously worked at [employer].   FAMILY HISTORY:  We were not able to obtain due to patients' status.   REVIEW OF SYSTEMS:  Not able to obtain due to the patient's status.   PHYSICAL EXAMINATION:  GENERAL:  He is a 55 year old male who ignores  his right side.  He is supine in bed.  He is in no acute distress.  He  will respond appropriately to commands on his left side, but with no  commands to his right.  His eyes are deviated to the left.  In general,  he is lethargic.  He will respond slightly.  Speech is somewhat garbled.  VITAL SIGNS:  His blood pressure is 113/69 currently, heart rate is in  the 60s, respiratory rate is in the 20s.  SKIN:  Warm and dry.  He is somewhat pale but color is appropriate.  HEENT:  He is normocephalic  and atraumatic.  Pupils are round with some  variation of the disconjugate gaze on the right, but to a large extent,  pupils are deviated to the left.  Oropharynx is unremarkable.  NECK:  Supple.  I can appreciate no bruits.  There is no adenopathy  present.  The neck was supple and flexible.  LUNGS:  Reasonably clear. No dyspnea.  HEART:  No murmur or gallop. Regular rhythm.  ABDOMEN:  Soft and nontender.  Mildly obese. + bowel sounds.  EXTREMITIES:  He is really flaccid with decreased movement on the right  arm and right leg.  NEUROLOGIC:  He does have deep tendon reflexes bilaterally, but they are  brisk on the left and 1+ on the right.  There is no Babinski present.  Mental status exam cannot be performed.  He has  right facial weakness.  Gait is not tested.   OVERALL IMPRESSION:  1. Large left-sided stroke with right hemiplegia secondary to the      middle cerebral artery territory with questionable etiology.  2. Bradycardia with hypotension, almost certainly vagal in nature with      response to atropine.   PLAN:  I think supportive care per Neurology is appropriate.  I do not  think he needs pacemaker at this point in time.  Supportive care and  p.r.n. atropine  would be the most appropriate management at this point  in time.  We will check a 2D echocardiogram looking for source of  embolus.  He may need a bubble study or a TEE eventually.      Colleen Can. Deborah Chalk, M.D.  Electronically Signed     SNT/MEDQ  D:  07/15/2008  T:  07/15/2008  Job:  [id number]

## 2010-10-25 NOTE — Discharge Summary (Signed)
NAME:  James Villegas, James Villegas                          ACCOUNT NO.:  [id number]   MEDICAL RECORD NO.:  [id number]                   PATIENT TYPE:  INP   LOCATION:  0362                                 FACILITY:  [facility]   PHYSICIAN:  Deirdre Peer. Polite, M.D.              DATE OF BIRTH:  [birth year]   DATE OF ADMISSION:  03/17/2002  DATE OF DISCHARGE:  03/21/2002                                 DISCHARGE SUMMARY   DISCHARGE DIAGNOSES:  1. Bilateral pulmonary embolisms.  2. Hyperhomocystine level.  3. Depression.  4. Obesity.   DISCHARGE MEDICATIONS:  1. Prozac 20 mg daily.  2. Coumadin 7.5 mg daily.  3. Trazodone 50 mg at bedtime.  4. Folic acid 1 mg daily.  5. Claritin 10 mg daily.  6. Xanax 0.25 mg every 8 hours as needed for anxiety.   CONSULTATIONS:  None this hospitalization.   PROCEDURES AND STUDIES:  1. Chest x-ray on October 9th showed probable bronchitis, no evidence of     active infiltrate or congestive heart failure.  2. CT of the chest on October 9th with bilateral small likely secondary to     tertiary branch __________ pulmonary emboli, patchy atelectatic     infiltrate of change superior segment left lower lobe with right basilar     atelectasis, otherwise negative.  3. CT of bilateral lower extremities with contrast was negative for DVT.     That was also done on October 9th.  4. EKG on October 9th with normal sinus rhythm with sinus arrhythmia,     ventricular rate 96.   LABORATORY DATA:  Admission ABG on room air with a pH of 7.42, PCO2 38, PO2  50.8, bicarbonate 24.9, O2 saturation 87.  Sodium 139, potassium 4.1,  chloride 106, CO2 27, glucose 88, BUN 18, creatinine 1.1, calcium 9.3.  Discharge CBC on October 13th with WBC count of 8.6, RBC of 4.95, hemoglobin  14.6, hematocrit 42.2, MCV 85.2, platelet count 216.  Discharge PT 23.7, INR  2.5.  Antithrombin III enzyme normal at 106.  Protein C normal at 100,  protein S normal at 129.  Lupus anticoagulant was  negative.  Homocystine  level 16.83.  Cardiolipin antibody, IgG, IgM, and IgA were all negative.  Stool for occult blood was negative.   DISPOSITION:  The patient will be discharged home.   HISTORY OF PRESENT ILLNESS:  A 55 year old man who presented to [facility]  on March 17, 2002 with complaints of shortness of breath, palpitations, and  lightheadedness.  Initial CT in the emergency room revealed bilateral  pulmonary embolisms.  The patient reported no recent leg trauma, no history  of DVTs.  He did report a short plane ride approximately two weeks prior.  The patient does work but essentially is sedentary at home.  He was admitted  for treatment of bilateral pulmonary embolisms.    HOSPITAL COURSE:  1. PULMONARY EMBOLISMS:  Again, CT revealed bilateral small tertiary branch     vessel emboli.  Initial ABG with hypoxia.  CT of bilateral lower     extremities was negative for DVTs.  Hypercoagulopathy workup was     initiated.  IV heparin and p.o. Coumadin were started.  Daily PT and INRs     were done.  At discharge, his INR is 2.5.  He is being discharged on 7.5     mg of Coumadin.  He is to have a PT/INR drawn on the 15th.  The patient     had no signs or symptoms of bleeding.  His hypercoagulopathy workup     revealed an elevated homocystine level at 16.83 and folic acid was     started at 1 mg daily.   1. DEPRESSION:  The patient has a long-history of depression.  Was     previously on Prozac and trazodone which was continued.  This appears to     be stable.   1. OBESITY:  Per primary care M.D.   FOLLOW UP AND INSTRUCTIONS:  The patient has been set up as a new patient  with Dr. __________.  He has an appointment on October 15th at 2:15 p.m. at  which time he should have a PT/INR drawn.  The patient has been instructed  on bleeding precautions, avoid razors, use a soft toothbrush, monitor stools  for any signs or symptoms or any signs of blood.     Stephanie Swaziland, NP                       Deirdre Peer. Polite, M.D.    SJ/MEDQ  D:  03/21/2002  T:  03/21/2002  Job:  [id number]   cc:   Dr. Gaylan Gerold

## 2010-12-08 DIAGNOSIS — S301XXA Contusion of abdominal wall, initial encounter: Secondary | ICD-10-CM

## 2010-12-08 HISTORY — DX: Contusion of abdominal wall, initial encounter: S30.1XXA

## 2010-12-08 HISTORY — PX: OTHER SURGICAL HISTORY: SHX169

## 2010-12-15 ENCOUNTER — Emergency Department (HOSPITAL_COMMUNITY): Payer: Medicaid Other

## 2010-12-15 ENCOUNTER — Inpatient Hospital Stay (HOSPITAL_COMMUNITY)
Admission: EM | Admit: 2010-12-15 | Discharge: 2010-12-31 | DRG: 253 | Disposition: A | Discharge status: Skilled Nursing Facility | Payer: Medicaid Other | Attending: Family Medicine | Admitting: Family Medicine

## 2010-12-15 DIAGNOSIS — E860 Dehydration: Secondary | ICD-10-CM | POA: Diagnosis present

## 2010-12-15 DIAGNOSIS — Z86718 Personal history of other venous thrombosis and embolism: Secondary | ICD-10-CM

## 2010-12-15 DIAGNOSIS — G40909 Epilepsy, unspecified, not intractable, without status epilepticus: Secondary | ICD-10-CM | POA: Diagnosis present

## 2010-12-15 DIAGNOSIS — D6859 Other primary thrombophilia: Secondary | ICD-10-CM | POA: Diagnosis present

## 2010-12-15 DIAGNOSIS — K560 Paralytic ileus: Secondary | ICD-10-CM | POA: Diagnosis not present

## 2010-12-15 DIAGNOSIS — K920 Hematemesis: Secondary | ICD-10-CM | POA: Diagnosis not present

## 2010-12-15 DIAGNOSIS — T45515A Adverse effect of anticoagulants, initial encounter: Secondary | ICD-10-CM | POA: Diagnosis present

## 2010-12-15 DIAGNOSIS — E872 Acidosis, unspecified: Secondary | ICD-10-CM | POA: Diagnosis present

## 2010-12-15 DIAGNOSIS — I10 Essential (primary) hypertension: Secondary | ICD-10-CM | POA: Diagnosis present

## 2010-12-15 DIAGNOSIS — I69959 Hemiplegia and hemiparesis following unspecified cerebrovascular disease affecting unspecified side: Secondary | ICD-10-CM

## 2010-12-15 DIAGNOSIS — K5900 Constipation, unspecified: Secondary | ICD-10-CM | POA: Diagnosis present

## 2010-12-15 DIAGNOSIS — E785 Hyperlipidemia, unspecified: Secondary | ICD-10-CM | POA: Diagnosis present

## 2010-12-15 DIAGNOSIS — F3289 Other specified depressive episodes: Secondary | ICD-10-CM | POA: Diagnosis present

## 2010-12-15 DIAGNOSIS — R1310 Dysphagia, unspecified: Secondary | ICD-10-CM | POA: Diagnosis present

## 2010-12-15 DIAGNOSIS — Z87891 Personal history of nicotine dependence: Secondary | ICD-10-CM

## 2010-12-15 DIAGNOSIS — D62 Acute posthemorrhagic anemia: Secondary | ICD-10-CM | POA: Diagnosis present

## 2010-12-15 DIAGNOSIS — N35919 Unspecified urethral stricture, male, unspecified site: Secondary | ICD-10-CM | POA: Diagnosis present

## 2010-12-15 DIAGNOSIS — Z7901 Long term (current) use of anticoagulants: Secondary | ICD-10-CM

## 2010-12-15 DIAGNOSIS — I498 Other specified cardiac arrhythmias: Secondary | ICD-10-CM | POA: Diagnosis present

## 2010-12-15 DIAGNOSIS — I824Z9 Acute embolism and thrombosis of unspecified deep veins of unspecified distal lower extremity: Secondary | ICD-10-CM | POA: Diagnosis not present

## 2010-12-15 DIAGNOSIS — R58 Hemorrhage, not elsewhere classified: Principal | ICD-10-CM | POA: Diagnosis present

## 2010-12-15 DIAGNOSIS — D72829 Elevated white blood cell count, unspecified: Secondary | ICD-10-CM | POA: Diagnosis present

## 2010-12-15 DIAGNOSIS — F329 Major depressive disorder, single episode, unspecified: Secondary | ICD-10-CM | POA: Diagnosis present

## 2010-12-15 DIAGNOSIS — F411 Generalized anxiety disorder: Secondary | ICD-10-CM | POA: Diagnosis present

## 2010-12-15 DIAGNOSIS — D68312 Antiphospholipid antibody with hemorrhagic disorder: Secondary | ICD-10-CM | POA: Diagnosis present

## 2010-12-15 DIAGNOSIS — E876 Hypokalemia: Secondary | ICD-10-CM | POA: Diagnosis not present

## 2010-12-15 HISTORY — DX: Acute embolism and thrombosis of unspecified deep veins of unspecified lower extremity: I82.409

## 2010-12-15 HISTORY — DX: Cerebral infarction, unspecified: I63.9

## 2010-12-15 LAB — CBC
HCT: 22.4 % — ABNORMAL LOW (ref 39.0–52.0)
Hemoglobin: 7.9 g/dL — ABNORMAL LOW (ref 13.0–17.0)
MCH: 31 pg (ref 26.0–34.0)
MCHC: 35.3 g/dL (ref 30.0–36.0)
MCV: 87.8 fL (ref 78.0–100.0)
Platelets: 292 K/uL (ref 150–400)
RBC: 2.55 MIL/uL — ABNORMAL LOW (ref 4.22–5.81)
RDW: 13.3 % (ref 11.5–15.5)
WBC: 23.8 K/uL — ABNORMAL HIGH (ref 4.0–10.5)

## 2010-12-15 LAB — COMPREHENSIVE METABOLIC PANEL
ALT: 12 U/L (ref 0–53)
AST: 15 U/L (ref 0–37)
Albumin: 2.8 g/dL — ABNORMAL LOW (ref 3.5–5.2)
CO2: 23 mEq/L (ref 19–32)
Chloride: 100 mEq/L (ref 96–112)
Creatinine, Ser: 1.62 mg/dL — ABNORMAL HIGH (ref 0.50–1.35)
Sodium: 136 mEq/L (ref 135–145)
Total Bilirubin: 0.3 mg/dL (ref 0.3–1.2)

## 2010-12-15 LAB — CK TOTAL AND CKMB (NOT AT ARMC)
CK, MB: 6.5 ng/mL (ref 0.3–4.0)
Relative Index: 2.6 — ABNORMAL HIGH (ref 0.0–2.5)
Total CK: 249 U/L — ABNORMAL HIGH (ref 7–232)

## 2010-12-15 LAB — COMPREHENSIVE METABOLIC PANEL WITH GFR
Alkaline Phosphatase: 66 U/L (ref 39–117)
BUN: 27 mg/dL — ABNORMAL HIGH (ref 6–23)
Calcium: 8.6 mg/dL (ref 8.4–10.5)
GFR calc Af Amer: 54 mL/min — ABNORMAL LOW (ref >60)
GFR calc non Af Amer: 45 mL/min — ABNORMAL LOW (ref >60)
Glucose, Bld: 190 mg/dL — ABNORMAL HIGH (ref 70–99)
Potassium: 4.5 meq/L (ref 3.5–5.1)
Total Protein: 6.1 g/dL (ref 6.0–8.3)

## 2010-12-15 LAB — DIFFERENTIAL
Basophils Absolute: 0 K/uL (ref 0.0–0.1)
Basophils Relative: 0 % (ref 0–1)
Eosinophils Absolute: 0 10*3/uL (ref 0.0–0.7)
Eosinophils Relative: 0 % (ref 0–5)
Lymphocytes Relative: 8 % — ABNORMAL LOW (ref 12–46)
Lymphs Abs: 1.9 10*3/uL (ref 0.7–4.0)
Monocytes Absolute: 2.6 10*3/uL — ABNORMAL HIGH (ref 0.1–1.0)
Monocytes Relative: 11 % (ref 3–12)
Neutro Abs: 19.3 10*3/uL — ABNORMAL HIGH (ref 1.7–7.7)
Neutrophils Relative %: 81 % — ABNORMAL HIGH (ref 43–77)
nRBC: 2 /100 WBC — ABNORMAL HIGH

## 2010-12-15 LAB — PROTIME-INR
INR: 7.05 (ref 0.00–1.49)
Prothrombin Time: 61.6 seconds — ABNORMAL HIGH (ref 11.6–15.2)

## 2010-12-15 LAB — TROPONIN I: Troponin I: 0.85 ng/mL (ref <0.30)

## 2010-12-15 LAB — AMMONIA: Ammonia: 48 umol/L (ref 11–60)

## 2010-12-15 LAB — PROCALCITONIN: Procalcitonin: 1.35 ng/mL

## 2010-12-15 LAB — CARDIAC PANEL(CRET KIN+CKTOT+MB+TROPI)
CK, MB: 7.8 ng/mL (ref 0.3–4.0)
Relative Index: 3.1 — ABNORMAL HIGH (ref 0.0–2.5)
Total CK: 255 U/L — ABNORMAL HIGH (ref 7–232)

## 2010-12-15 LAB — SAMPLE TO BLOOD BANK

## 2010-12-15 LAB — ABO/RH: ABO/RH(D): O POS

## 2010-12-15 LAB — GLUCOSE, CAPILLARY: Glucose-Capillary: 166 mg/dL — ABNORMAL HIGH (ref 70–99)

## 2010-12-15 LAB — OCCULT BLOOD, POC DEVICE: Fecal Occult Bld: NEGATIVE

## 2010-12-15 LAB — APTT: aPTT: 98 s — ABNORMAL HIGH (ref 24–37)

## 2010-12-15 LAB — LACTIC ACID, PLASMA
Lactic Acid, Venous: 4.5 mmol/L — ABNORMAL HIGH (ref 0.5–2.2)
Lactic Acid, Venous: 4.9 mmol/L — ABNORMAL HIGH (ref 0.5–2.2)

## 2010-12-15 MED ORDER — IOHEXOL 300 MG/ML  SOLN
75.0000 mL | Freq: Once | INTRAMUSCULAR | Status: AC | PRN
  Administered 2010-12-15: 75 mL via INTRAVENOUS

## 2010-12-15 NOTE — H&P (Signed)
NAME:  James Villegas, James Villegas NO.:  [id number]  MEDICAL RECORD NO.:  [id number]  LOCATION:  [facility]                         FACILITY:  [facility]  PHYSICIAN:  Standley Dakins, MD   DATE OF BIRTH:  Jan 26, 1956  DATE OF ADMISSION:  12/15/2010 DATE OF DISCHARGE:                             HISTORY & PHYSICAL   PRIMARY CARE PHYSICIAN:  Karlene Einstein, M.D., General Internal Medicine.  HISTORIAN:  The patient and the ED Physician Dr. Quita Skye.  CHIEF COMPLAINT:  Abdominal pain.  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  He also had many other symptoms.  The patient  reported that over the past week his abdominal pain increased in severity.   He reports that he has had shortness of breath, nausea and vomiting.  He has decreased appetite, loose stools and elevated blood sugars.  He reports that he has had some difficulty urinating.  He reports that he has never had problems with regulating his Coumadin.  He has it checked twice monthly.    When he arrived in the Emergency Department, some preliminary tests determined his PT to be elevated at 61.6 with an INR of 7.05.  In addition,  the patient had a CT scan of the abdomen and pelvis that revealed a  moderate extraperitoneal hemorrhage within the left pelvis and left  abdominal wall rectus sheath.  The patient was anemic and started  receiving packed red blood cells in Emergency Department in addition  to an order for fresh frozen plasma.  He reports that overall, he feels malaise.  He has been fatigued and overall just not feeling well.  He is not eating and drinking as he  would normally.   Hospital admission was requested for further evaluation  and treatment.  The patient was also assessed by the Critical Care  Specialist Dr. Delton Coombes in the ED and determined to be stable for admission  to the Step Down Unit to the medical service.  Of note, the patient's lactic  acid level is elevated and thought to be related to profound volume depletion however, he was empirically started on broad-spectrum antibiotics and aggressive IV fluid hydration started.  The patient was also evaluated by urologicial  specialists in the ER because of poor voiding and thought to be volume deficient but no true bladder outlet obstruction could be determined.   PAST MEDICAL HISTORY: 1. Cerebrovascular disease status post large left middle cerebral     artery and small distal anterior cerebral artery infarct in     February 2010. 2. History of right lower extremity DVT. 3. Positive lupus anticoagulant. 4. Decreased protein C/S. 5. Hypertension. 6. Hyperlipidemia. 7. Depression. 8. Anxiety. 9. Chronic anticoagulation with warfarin. 10.History of patent foramen ovale with moderate right to left     shunting. 11.History of dysphagia. 12.History of bradycardia and hypotension thought to be vagal. 13.Seizure Disorder.  14.Narrow Urethral Meatus -  Making Foley Catheter Placement difficult.  15.Chronic Constipation. 16.Morbid Obesity.  MEDICATIONS: (REPORTED BY ED PHYSICIAN - PT DID NOT BRING MEDS OR KNOW OF MEDS) 1. Amlodipine 2. Baclofen 3. Citalopram 4. Coumadin 5. Dilantin ER 6. Docusate sodium 7. Folic Acid 8. Gabapentin 9. Miralax 10.Omeprazole 11.Pravastatin 12.Trazodone  ALLERGIES:  NKDA  PAST SURGICAL HISTORY:  The patient has had multiple dental extractions in the past.  SOCIAL HISTORY:  He is a current resident at the [facility].  He is a former smoker, does not currently smoke or use recreational drugs or alcohol.  FAMILY HISTORY:  He  reports his mother died at the age 40 of a brain tumor.  His father died at age 62 from gunshot wound.  He has a sister that is reported as healthy.  REVIEW OF SYSTEMS:  Significant for all the positives listed in the HPI, otherwise all reviewed and reported as negative.  PHYSICAL EXAMINATION:  VITAL SIGNS:  Currently temperature 98.4, pulse 122, respirations 22, blood pressure 93/55, and pulse ox of 100% on 2 L nasal cannula. GENERAL:  This is a well-developed, overweight male.  He is lying in the bed.  He is awake, alert and in no apparent distress, slightly diaphoretic. He is mentating well at this time.  HEENT:  Normocephalic, atraumatic.  Mucous membranes are dry and pale. NECK:  Supple.  No JVD.  Trachea midline.  Thyroid soft.  No nodules or masses palpated. LUNGS:  Bilateral breath sounds.  Clear to auscultation.  No crackles, wheezes, or rhonchi.  Shallow breath sounds noted. CARDIAC:  Tachycardic, bounding heart rate with a systolic murmur 1/6 at the left sternal border. No JVD. ABDOMEN:  Obese, distended, brusing of LLQ.  Bowel sounds present.   Tenderness in the left lower quadrant.  No guarding or rebound tenderness  noted. NEUROLOGIC:  The patient has residual hemipareses on the right side from previous CVA.  EXTREMITIES:  No pretibial edema, cyanosis, or clubbing noted. SKIN:  No gross lesions noted. PSYCH: Awake, normal affect, oriented to person, place, time and situation.  LABORATORY DATA:  Lactic acid 4.9.  Type and cross O+, procalcitonin 1.35, PTT 98, PT 61.6, INR 7.05, and occult blood negative.  Sodium 136, potassium 4.5, chloride 100, CO2 of 23, BUN 27, creatinine 1.62.  GFR estimated 45, bilirubin 0.3, total protein 6.1, calcium 8.6, and troponin 0.85.  CK 249, CK-MB 6.5, ammonia 48, hemoglobin 7.9, hematocrit 22.4, white blood cell count 23.8, platelet count 292, lactic acid initially at 12:40 is 4.5.  IMPRESSION: 1. Coagulopathy with spontaneous  bleeding from excessive warfarin effect. 2. Leukocytosis. 3. Moderate extraperitoneal hemorrhage within the left pelvis and left     abdominal wall rectus sheath. 4. Difficulty with urination.  Narrow urethral Meatus.  5. Dehydration. 6. Anemia. 7. Cerebrovascular disease. 8. Elevated cardiac enzymes. 9. History of seizures, currently stable. 10.Medications need to be reconciled through the pharmacy as the     patient did not bring his medications to the hospital today.  He is     unable to tell us exactly what medicines he is taking at this time. 11.Lactic Acidosis.  12.Question of Sepsis.  13.Full Code. 14.Sinus Tachycardia.  PLAN: 1. The patient is going to be admitted to the Step-Down Unit.  I have already   consulted with critical care specialists and [facility].    2.  The patient is already receiving packed red blood cells and ordered for fresh     frozen  plasma is in the chart.  I have ordered to give Vitamin K PO.   3. We are going to hydrate him aggressively.  Antibiotics empirically have     been started.  We will monitor closely.  Make adjustments to medical     care as required.  4. The patient expresses desire to be full code.   5. I called and spoke with patient's sister in [place], [place] and updated    with patient's permission.   6. Reconcile Medications as soon as possible.  Check Dilantin level.   7. Monitor Hemoglobin closely for acute decompensation.   8. Culture Blood and Urine.  Repeat CXR after hydration to rule out nosocomial    pneumonia (pt is institutionalized).   Please see hospital records for full details of this complex admission.     Standley Dakins, MD     CJ/MEDQ  D:  12/15/2010  T:  12/15/2010  Job:  [id number]  cc:   Karlene Einstein, M.D.  Electronically Signed by Standley Dakins  on 12/15/2010 06:41:52 PM

## 2010-12-15 NOTE — Group Therapy Note (Signed)
  NAME:  JOHNCARLOS, HOLTSCLAW NO.:  [id number]  MEDICAL RECORD NO.:  [id number]  LOCATION:  [facility]                         FACILITY:  [facility]  PHYSICIAN:  Standley Dakins, MD   DATE OF BIRTH:  1956/03/06                        BRIEF ADMISSION NOTE   [facility] December 15, 2010  6:45 p.m.   This patient, James Villegas was admitted today as a 55 year old gentleman with multiple comorbidities including chronic anticoagulation with warfarin for protein C and S deficiency and lupus anticoagulant with a history of DVT.  He was admitted for abdominal pain and found to have an elevated PT with INR 7.  He also had a moderate extraperitoneal hemorrhage within the left pelvis and left abdominal wall rectus sheath. The patient was evaluated in the emergency department by the critical care team and deemed appropriate for placement in the step-down unit.   The patient is currently hemodynamically stable.  His vital signs are  stable.  His tachycardia improved with bolus IV fluids.  He is mentating appropriately at this time and in no distress.  He is currently receiving treatments from the emergency department physician with packed red blood cells and IV fluids and IV antibiotics have been started as well.    He is also receiving fresh frozen plasma.  The patient is going to be  immediately transferred up to the step-down unit.  I had  a long discussion with him about code status and he told me clearly of sound mind that he would like to be a full code.  He would like to be intubated if necessary and have full resuscitation  measures.    The patient was also seen by the Urology Service and they are going to be following him as well for his poor urine output.  He is going to be fitted with a condom catheter and going to be hydrated aggressively.  At this time, his bladder is empty.  The patient is going to be monitored closely for any signs of decompensation.   Critical care specialist, who evaluated him was Dr. Delton Coombes and Dr. Jacquelyne Balint was the urologist who evaluated him in the emergency department as well.  Please see dictated H&P.    Standley Dakins, MD, CDE, FAAFP     CJ/MEDQ  D:  12/15/2010  T:  12/15/2010  Job:  [id number]  Electronically Signed by Standley Dakins  on 12/15/2010 06:46:55 PM

## 2010-12-16 ENCOUNTER — Inpatient Hospital Stay (HOSPITAL_COMMUNITY): Payer: Medicaid Other

## 2010-12-16 LAB — PREPARE FRESH FROZEN PLASMA: Unit division: 0

## 2010-12-16 LAB — CARDIAC PANEL(CRET KIN+CKTOT+MB+TROPI)
Relative Index: 2.8 — ABNORMAL HIGH (ref 0.0–2.5)
Troponin I: 0.38 ng/mL (ref <0.30)

## 2010-12-16 LAB — CBC
HCT: 23.8 % — ABNORMAL LOW (ref 39.0–52.0)
HCT: 19.3 % — ABNORMAL LOW (ref 39.0–52.0)
MCHC: 35.8 g/dL (ref 30.0–36.0)
Platelets: 241 10*3/uL (ref 150–400)
RDW: 13.8 % (ref 11.5–15.5)
RDW: 14 % (ref 11.5–15.5)
WBC: 20.2 10*3/uL — ABNORMAL HIGH (ref 4.0–10.5)

## 2010-12-16 LAB — PROTIME-INR: INR: 4.34 — ABNORMAL HIGH (ref 0.00–1.49)

## 2010-12-16 LAB — COMPREHENSIVE METABOLIC PANEL
AST: 17 U/L (ref 0–37)
Albumin: 2.8 g/dL — ABNORMAL LOW (ref 3.5–5.2)
Alkaline Phosphatase: 62 U/L (ref 39–117)
Chloride: 101 mEq/L (ref 96–112)
Potassium: 4.5 mEq/L (ref 3.5–5.1)
Total Bilirubin: 0.4 mg/dL (ref 0.3–1.2)

## 2010-12-16 LAB — HEMOGLOBIN A1C: Mean Plasma Glucose: 108 mg/dL (ref <117)

## 2010-12-16 LAB — LIPID PANEL
LDL Cholesterol: 90 mg/dL (ref 0–99)
VLDL: 32 mg/dL (ref 0–40)

## 2010-12-16 LAB — PREPARE RBC (CROSSMATCH)

## 2010-12-16 LAB — CLOSTRIDIUM DIFFICILE BY PCR: Toxigenic C. Difficile by PCR: NEGATIVE

## 2010-12-17 ENCOUNTER — Inpatient Hospital Stay (HOSPITAL_COMMUNITY): Payer: Medicaid Other

## 2010-12-17 DIAGNOSIS — R933 Abnormal findings on diagnostic imaging of other parts of digestive tract: Secondary | ICD-10-CM

## 2010-12-17 DIAGNOSIS — K920 Hematemesis: Secondary | ICD-10-CM

## 2010-12-17 DIAGNOSIS — D62 Acute posthemorrhagic anemia: Secondary | ICD-10-CM

## 2010-12-17 LAB — CBC
MCHC: 35.6 g/dL (ref 30.0–36.0)
MCHC: 34.8 g/dL (ref 30.0–36.0)
MCV: 85.9 fL (ref 78.0–100.0)
Platelets: 189 10*3/uL (ref 150–400)
Platelets: 141 10*3/uL — ABNORMAL LOW (ref 150–400)
RDW: 13.9 % (ref 11.5–15.5)
RDW: 15.3 % (ref 11.5–15.5)
WBC: 17.4 10*3/uL — ABNORMAL HIGH (ref 4.0–10.5)

## 2010-12-17 LAB — PROTIME-INR
INR: 1.16 (ref 0.00–1.49)
Prothrombin Time: 15 seconds (ref 11.6–15.2)

## 2010-12-17 LAB — PREPARE FRESH FROZEN PLASMA

## 2010-12-17 LAB — PREPARE RBC (CROSSMATCH)

## 2010-12-17 LAB — BASIC METABOLIC PANEL
Calcium: 8.4 mg/dL (ref 8.4–10.5)
Chloride: 99 mEq/L (ref 96–112)
Creatinine, Ser: 2.15 mg/dL — ABNORMAL HIGH (ref 0.50–1.35)
GFR calc Af Amer: 39 mL/min — ABNORMAL LOW (ref >60)
GFR calc non Af Amer: 32 mL/min — ABNORMAL LOW (ref >60)

## 2010-12-18 ENCOUNTER — Inpatient Hospital Stay (HOSPITAL_COMMUNITY): Payer: Medicaid Other

## 2010-12-18 DIAGNOSIS — E876 Hypokalemia: Secondary | ICD-10-CM

## 2010-12-18 DIAGNOSIS — K560 Paralytic ileus: Secondary | ICD-10-CM

## 2010-12-18 LAB — CROSSMATCH
ABO/RH(D): O POS
Antibody Screen: NEGATIVE
Unit division: 0
Unit division: 0
Unit division: 0
Unit division: 0
Unit division: 0

## 2010-12-18 LAB — CBC
HCT: 24.3 % — ABNORMAL LOW (ref 39.0–52.0)
HCT: 24.3 % — ABNORMAL LOW (ref 39.0–52.0)
Hemoglobin: 8.4 g/dL — ABNORMAL LOW (ref 13.0–17.0)
MCH: 29.5 pg (ref 26.0–34.0)
MCH: 29 pg (ref 26.0–34.0)
MCHC: 34.6 g/dL (ref 30.0–36.0)
MCHC: 34 g/dL (ref 30.0–36.0)
MCV: 84.4 fL (ref 78.0–100.0)
MCV: 85.3 fL (ref 78.0–100.0)
MCV: 85.3 fL (ref 78.0–100.0)
Platelets: 135 10*3/uL — ABNORMAL LOW (ref 150–400)
Platelets: 143 10*3/uL — ABNORMAL LOW (ref 150–400)
RBC: 2.88 MIL/uL — ABNORMAL LOW (ref 4.22–5.81)
RDW: 15.3 % (ref 11.5–15.5)
RDW: 15.2 % (ref 11.5–15.5)
RDW: 15.2 % (ref 11.5–15.5)
WBC: 13.4 10*3/uL — ABNORMAL HIGH (ref 4.0–10.5)

## 2010-12-18 LAB — COMPREHENSIVE METABOLIC PANEL
ALT: 20 U/L (ref 0–53)
AST: 31 U/L (ref 0–37)
Alkaline Phosphatase: 57 U/L (ref 39–117)
Calcium: 8.2 mg/dL — ABNORMAL LOW (ref 8.4–10.5)
GFR calc Af Amer: >60 mL/min (ref >60)
Glucose, Bld: 96 mg/dL (ref 70–99)
Potassium: 3.2 mEq/L — ABNORMAL LOW (ref 3.5–5.1)
Sodium: 138 mEq/L (ref 135–145)
Total Protein: 6 g/dL (ref 6.0–8.3)

## 2010-12-18 LAB — PROTIME-INR: INR: 1.17 (ref 0.00–1.49)

## 2010-12-19 DIAGNOSIS — M79609 Pain in unspecified limb: Secondary | ICD-10-CM

## 2010-12-19 DIAGNOSIS — Z86718 Personal history of other venous thrombosis and embolism: Secondary | ICD-10-CM

## 2010-12-19 DIAGNOSIS — I80299 Phlebitis and thrombophlebitis of other deep vessels of unspecified lower extremity: Secondary | ICD-10-CM

## 2010-12-19 LAB — BASIC METABOLIC PANEL
BUN: 18 mg/dL (ref 6–23)
BUN: 16 mg/dL (ref 6–23)
Calcium: 8.3 mg/dL — ABNORMAL LOW (ref 8.4–10.5)
Calcium: 8.2 mg/dL — ABNORMAL LOW (ref 8.4–10.5)
Creatinine, Ser: 1.06 mg/dL (ref 0.50–1.35)
GFR calc Af Amer: >60 mL/min (ref >60)
GFR calc Af Amer: >60 mL/min (ref >60)
GFR calc non Af Amer: >60 mL/min (ref >60)
GFR calc non Af Amer: >60 mL/min (ref >60)
Glucose, Bld: 107 mg/dL — ABNORMAL HIGH (ref 70–99)
Glucose, Bld: 118 mg/dL — ABNORMAL HIGH (ref 70–99)
Potassium: 3.8 mEq/L (ref 3.5–5.1)
Sodium: 134 mEq/L — ABNORMAL LOW (ref 135–145)

## 2010-12-19 LAB — CBC
HCT: 23.5 % — ABNORMAL LOW (ref 39.0–52.0)
HCT: 24.9 % — ABNORMAL LOW (ref 39.0–52.0)
HCT: 25.1 % — ABNORMAL LOW (ref 39.0–52.0)
Hemoglobin: 8.6 g/dL — ABNORMAL LOW (ref 13.0–17.0)
Hemoglobin: 8.6 g/dL — ABNORMAL LOW (ref 13.0–17.0)
MCH: 29.3 pg (ref 26.0–34.0)
MCH: 29.6 pg (ref 26.0–34.0)
MCHC: 34.5 g/dL (ref 30.0–36.0)
MCV: 85.1 fL (ref 78.0–100.0)
RBC: 2.92 MIL/uL — ABNORMAL LOW (ref 4.22–5.81)
RDW: 15.3 % (ref 11.5–15.5)
RDW: 15.3 % (ref 11.5–15.5)
WBC: 13.6 10*3/uL — ABNORMAL HIGH (ref 4.0–10.5)
WBC: 14.3 10*3/uL — ABNORMAL HIGH (ref 4.0–10.5)

## 2010-12-19 LAB — URINALYSIS, ROUTINE W REFLEX MICROSCOPIC
Bilirubin Urine: NEGATIVE
Protein, ur: NEGATIVE mg/dL
Urobilinogen, UA: 0.2 mg/dL (ref 0.0–1.0)

## 2010-12-19 LAB — IRON AND TIBC
Saturation Ratios: 17 % — ABNORMAL LOW (ref 20–55)
UIBC: 176 ug/dL

## 2010-12-19 LAB — PROTIME-INR: Prothrombin Time: 14.3 seconds (ref 11.6–15.2)

## 2010-12-19 LAB — URINE MICROSCOPIC-ADD ON

## 2010-12-20 DIAGNOSIS — I8010 Phlebitis and thrombophlebitis of unspecified femoral vein: Secondary | ICD-10-CM

## 2010-12-20 DIAGNOSIS — I82409 Acute embolism and thrombosis of unspecified deep veins of unspecified lower extremity: Secondary | ICD-10-CM

## 2010-12-20 LAB — HEPARIN LEVEL (UNFRACTIONATED)
Heparin Unfractionated: <0.1 IU/mL — ABNORMAL LOW (ref 0.30–0.70)
Heparin Unfractionated: <0.1 IU/mL — ABNORMAL LOW (ref 0.30–0.70)

## 2010-12-20 LAB — CBC
HCT: 25.4 % — ABNORMAL LOW (ref 39.0–52.0)
HCT: 25.2 % — ABNORMAL LOW (ref 39.0–52.0)
Hemoglobin: 8.4 g/dL — ABNORMAL LOW (ref 13.0–17.0)
MCHC: 34.3 g/dL (ref 30.0–36.0)
MCHC: 33.3 g/dL (ref 30.0–36.0)
RDW: 15.5 % (ref 11.5–15.5)
RDW: 15.4 % (ref 11.5–15.5)
WBC: 14 10*3/uL — ABNORMAL HIGH (ref 4.0–10.5)

## 2010-12-20 LAB — FOLATE: Folate: 10.1 ng/mL

## 2010-12-20 LAB — MAGNESIUM: Magnesium: 2.3 mg/dL (ref 1.5–2.5)

## 2010-12-20 LAB — FERRITIN: Ferritin: 910 ng/mL — ABNORMAL HIGH (ref 22–322)

## 2010-12-20 LAB — PROTIME-INR: Prothrombin Time: 14.6 seconds (ref 11.6–15.2)

## 2010-12-20 LAB — VITAMIN B12: Vitamin B-12: 545 pg/mL (ref 211–911)

## 2010-12-20 LAB — PHOSPHORUS: Phosphorus: 2.3 mg/dL (ref 2.3–4.6)

## 2010-12-21 ENCOUNTER — Inpatient Hospital Stay (HOSPITAL_COMMUNITY): Payer: Medicaid Other

## 2010-12-21 LAB — PROTIME-INR
INR: 1.15 (ref 0.00–1.49)
Prothrombin Time: 14.9 seconds (ref 11.6–15.2)

## 2010-12-21 LAB — CBC
Hemoglobin: 8.8 g/dL — ABNORMAL LOW (ref 13.0–17.0)
MCHC: 33.3 g/dL (ref 30.0–36.0)
RDW: 15.6 % — ABNORMAL HIGH (ref 11.5–15.5)
WBC: 15.2 10*3/uL — ABNORMAL HIGH (ref 4.0–10.5)

## 2010-12-21 LAB — HEPARIN LEVEL (UNFRACTIONATED): Heparin Unfractionated: 0.28 IU/mL — ABNORMAL LOW (ref 0.30–0.70)

## 2010-12-22 LAB — CROSSMATCH
ABO/RH(D): O POS
Antibody Screen: NEGATIVE

## 2010-12-22 LAB — CULTURE, BLOOD (ROUTINE X 2)
Culture  Setup Time: 201207090028
Culture  Setup Time: 201207090028
Culture: NO GROWTH

## 2010-12-22 NOTE — Consult Note (Signed)
NAME:  James Villegas, James Villegas                ACCOUNT NO.:  [id number]  MEDICAL RECORD NO.:  [id number]  LOCATION:  5511                         FACILITY:  [facility]  PHYSICIAN:  Juleen China IV, MDDATE OF BIRTH:  1956-01-05  DATE OF CONSULTATION: DATE OF DISCHARGE:                                CONSULTATION   REASON FOR CONSULTATION:  DVT in the setting of coagulopathy and intraperitoneal bleed.  CONSULTING SERVICE:  Hospitalist.  HISTORY:  This is a 55 year old gentleman with a past medical history of cerebrovascular disease, protein C and S deficiency, and lupus anticoagulant who is on chronic anticoagulation with Coumadin who presented to emergency department with abdominal pain on December 15, 2010. A CT scan of the abdomen and pelvis revealed an extraperitoneal hemorrhage within the left pelvis and left abdominal wall rectus sheath. His Coumadin levels were corrected and he was admitted to the hospital. Over the course of the next day, he developed left leg swelling and was found to have a DVT in his left leg.  I have been asked to consult for additional management.  The patient is now back on heparin.  The patient has a history of a stroke with a large left middle cerebral artery and small distal anterior cerebral artery infarct in February 2010.  He was left with right side hemiparesis.  He also suffers from hypertension and hyperlipidemia.  He lives in [place] nursing facility.  He also has a history of patent foramen ovale which was detected at the time of his stroke.  At that time he refused treatment for this.  He has been managed with Coumadin.  He has also had an episode of bradycardia and hypotension thought to be vagal in nature.  REVIEW OF SYSTEMS:  Please see their review of systems in the original H and P in December 15, 2010.  PAST MEDICAL HISTORY: 1. Stroke. 2. DVT with positive lupus anticoagulant. 3. Protein C and S deficiency. 4. Hypertension. 5.  Hyperlipidemia. 6. Depression. 7. Anxiety. 8. Chronic anticoagulation. 9. Patent foramen ovale. 10.Dysphasia. 11.Bradycardia. 12.Seizure disorder. 13.Constipation. 14.Morbid obesity.  MEDICATIONS:  Please see medical record.  ALLERGIES:  None.  SOCIAL HISTORY:  He lives at [facility] rehabilitation facility.  He is a former smoker, does not smoke currently, does not drink.  FAMILY HISTORY:  Positive for his mother dying at age of 75 with a brain tumor.  His father died at 67 with a gunshot wound.  He has a sister that is healthy living in [place].  PHYSICAL EXAMINATION:  VITAL SIGNS:  Temperature is 98.5, pulse 96, respirations 20, blood pressure 129/85, O2 sats 91% on 2 liters. GENERAL:  He is resting comfortably in no acute distress. HEENT:  Within normal limits. LUNGS:  Clear bilaterally. CARDIAC:  Regular rate and rhythm.  There is faint systolic murmur. ABDOMEN:  Obese, somewhat tender in the left lower quadrant.  Ecchymosis on the lower region. NEUROLOGIC:  He has hemiparesis on the right side. EXTREMITIES:  Palpable pedal pulses bilaterally.  The left leg is edematous and tender.  There are no ulcerations. SKIN:  Without rash.  VASCULAR LABORATORY STUDIES:  Positive for acute DVT in the left  lower extremity.  ASSESSMENT/PLAN:  A 55 year old gentleman with hypercoagulable state who has had to come off his Coumadin because of intraperitoneal bleed.  He had subsequently developed a left leg deep vein thrombosis and is now back on Coumadin.  The patient is complex in his management with regards to prevention of future embolic event given his hypercoagulable state, as well as issues with chronic anticoagulation I think that in order to prevent pulmonary embolism that he is a candidate for an inferior vena cava filter.  In addition with having developed a deep vein thrombosis and having a patent foramen ovale it is reasonable to reconsider closure of his patent foramen  ovale.  I will plan on speaking with Dr. Tonny Bollman in the morning and give consideration to doing this.  This may delay placing the inferior vena cava filter but we will coordinate that in the morning.  I did speak with the patient's sister far on the telephone and discussed this with her.  I spent over an hour of total time with this patient's encounter.     Jorge Ny, MD     VWB/MEDQ  D:  12/20/2010  T:  12/20/2010  Job:  [id number]  Electronically Signed by Arelia Longest IV MD on 12/22/2010 11:06:39 PM

## 2010-12-23 ENCOUNTER — Inpatient Hospital Stay (HOSPITAL_COMMUNITY): Payer: Medicaid Other

## 2010-12-23 DIAGNOSIS — I82409 Acute embolism and thrombosis of unspecified deep veins of unspecified lower extremity: Secondary | ICD-10-CM

## 2010-12-23 DIAGNOSIS — Q211 Atrial septal defect: Secondary | ICD-10-CM

## 2010-12-23 LAB — CBC
HCT: 23.9 % — ABNORMAL LOW (ref 39.0–52.0)
MCH: 30 pg (ref 26.0–34.0)
MCHC: 33.9 g/dL (ref 30.0–36.0)
MCV: 88.5 fL (ref 78.0–100.0)
Platelets: 344 10*3/uL (ref 150–400)
RDW: 15.6 % — ABNORMAL HIGH (ref 11.5–15.5)

## 2010-12-23 NOTE — Op Note (Signed)
  NAME:  James Villegas, James Villegas NO.:  [id number]  MEDICAL RECORD NO.:  [id number]  LOCATION:  5511                         FACILITY:  [facility]  PHYSICIAN:  Janetta Hora. Jabbar Palmero, MD  DATE OF BIRTH:  [birth year]  DATE OF PROCEDURE:  12/20/2010 DATE OF DISCHARGE:                              OPERATIVE REPORT   SURGEON:  Janetta Hora. Randi Poullard, MD  PROCEDURES: 1. Inferior vena cavogram. 2. OptEase retrievable inferior vena cava filter placement.  PREOPERATIVE DIAGNOSIS:  Left lower extremity deep venous thrombosis.  POSTOPERATIVE DIAGNOSIS:  Left lower extremity deep venous thrombosis.  ANESTHESIA:  Local.  INDICATIONS:  The patient is a 55 year old male with a known left lower extremity DVT.  He currently has contraindication to Coumadin with a rectus sheath hematoma.  He presents today for placement of IVC filter. CT scan of the abdomen and pelvis from prior to today was reviewed, which did not show any obvious duplicated inferior vena cava or other significant anatomic anomaly other than a retroaortic left renal vein.  OPERATIVE DETAILS:  After obtaining informed consent, the patient was brought to the [facility].  The patient was placed in supine position on the angio table.  Both groins were prepped and draped in usual sterile fashion.  Local anesthesia was infiltrated over the right common femoral vein.  An introducer needle was used to cannulate the right common femoral vein and a 0.035 J-tip guidewire was threaded into the right femoral vein and up into the inferior vena cava under fluoroscopic guidance.  Next, a 5-French graduated pigtail catheter was placed over the guidewire into the inferior vena cava.  An inferior vena cavogram was then obtained.  This shows a caval diameter of 22.3 mm.  The left renal vein comes off of the midportion of the L3 vertebral body. Therefore, it was decided to deploy the filter centered on the L4 vertebral body just below the left renal  vein.  An additional IVC gram was obtained to again confirm that there was no renal vein visualized higher up.  At this point, an OptEase retrieval femoral filter was brought up on the operative field and this was placed into the sheath and using a pinch pull technique, the filter was deployed with the main portion of its body centered on the L4 vertebral body below the left renal vein.  Next, the sheath and filter delivery system was removed and hemostasis obtained with direct pressure.  The patient tolerated the procedure well and there were no complications.  The patient was taken back to his hospital room in stable condition.  OPERATIVE FINDINGS:  OptEase femoral retrieval IVC filter placed at L4 vertebral body below the left renal vein, which is centered at the L3 vertebral body.  The patient can be scheduled for filter retrieve at some point in the future if he is deemed a Coumadin candidate.     Janetta Hora. Markez Dowland, MD     CEF/MEDQ  D:  12/20/2010  T:  12/20/2010  Job:  [id number]  Electronically Signed by Fabienne Bruns MD on 12/23/2010 09:55:19 AM

## 2010-12-24 ENCOUNTER — Inpatient Hospital Stay (HOSPITAL_COMMUNITY): Payer: Medicaid Other

## 2010-12-24 ENCOUNTER — Encounter (HOSPITAL_COMMUNITY): Payer: Self-pay

## 2010-12-24 LAB — PROTIME-INR
Prothrombin Time: 14.3 seconds (ref 11.6–15.2)
Prothrombin Time: 14.5 seconds (ref 11.6–15.2)

## 2010-12-24 LAB — BASIC METABOLIC PANEL
BUN: 16 mg/dL (ref 6–23)
CO2: 27 mEq/L (ref 19–32)
Chloride: 104 mEq/L (ref 96–112)
GFR calc Af Amer: >60 mL/min (ref >60)
Glucose, Bld: 102 mg/dL — ABNORMAL HIGH (ref 70–99)
Potassium: 4 mEq/L (ref 3.5–5.1)

## 2010-12-24 LAB — APTT: aPTT: 35 seconds (ref 24–37)

## 2010-12-24 LAB — CBC
HCT: 23.1 % — ABNORMAL LOW (ref 39.0–52.0)
HCT: 23.6 % — ABNORMAL LOW (ref 39.0–52.0)
Hemoglobin: 7.5 g/dL — ABNORMAL LOW (ref 13.0–17.0)
MCH: 28.7 pg (ref 26.0–34.0)
MCHC: 32.5 g/dL (ref 30.0–36.0)
MCHC: 32.6 g/dL (ref 30.0–36.0)
MCV: 88.5 fL (ref 78.0–100.0)
Platelets: 411 10*3/uL — ABNORMAL HIGH (ref 150–400)
RDW: 15.5 % (ref 11.5–15.5)

## 2010-12-24 LAB — TYPE AND SCREEN

## 2010-12-24 LAB — HEPARIN LEVEL (UNFRACTIONATED): Heparin Unfractionated: <0.1 IU/mL — ABNORMAL LOW (ref 0.30–0.70)

## 2010-12-25 LAB — CBC
HCT: 23.1 % — ABNORMAL LOW (ref 39.0–52.0)
MCV: 87.5 fL (ref 78.0–100.0)
RBC: 2.64 MIL/uL — ABNORMAL LOW (ref 4.22–5.81)
WBC: 11.5 10*3/uL — ABNORMAL HIGH (ref 4.0–10.5)

## 2010-12-25 LAB — BASIC METABOLIC PANEL
CO2: 27 mEq/L (ref 19–32)
Calcium: 8.6 mg/dL (ref 8.4–10.5)
Chloride: 101 mEq/L (ref 96–112)
Potassium: 4 mEq/L (ref 3.5–5.1)
Sodium: 136 mEq/L (ref 135–145)

## 2010-12-25 LAB — PROTIME-INR: INR: 1.14 (ref 0.00–1.49)

## 2010-12-26 LAB — CBC
HCT: 22.5 % — ABNORMAL LOW (ref 39.0–52.0)
Hemoglobin: 7.3 g/dL — ABNORMAL LOW (ref 13.0–17.0)
MCV: 87.9 fL (ref 78.0–100.0)
Platelets: 224 10*3/uL (ref 150–400)
RBC: 2.56 MIL/uL — ABNORMAL LOW (ref 4.22–5.81)
WBC: 11.5 10*3/uL — ABNORMAL HIGH (ref 4.0–10.5)

## 2010-12-26 LAB — HEMOGLOBIN AND HEMATOCRIT, BLOOD
HCT: 22.1 % — ABNORMAL LOW (ref 39.0–52.0)
Hemoglobin: 7.2 g/dL — ABNORMAL LOW (ref 13.0–17.0)

## 2010-12-26 NOTE — Op Note (Signed)
NAME:  James Villegas, James Villegas NO.:  [id number]  MEDICAL RECORD NO.:  [id number]  LOCATION:  5511                         FACILITY:  [facility]  PHYSICIAN:  Veverly Fells. Excell Seltzer, MD  DATE OF BIRTH:  11-17-1955  DATE OF PROCEDURE: DATE OF DISCHARGE:                              OPERATIVE REPORT   REASON FOR CONSULTATION:  Patent foramen ovale.  HISTORY OF PRESENT ILLNESS:  James Villegas is a 55 year old gentleman with a complicated medical history who was admitted on December 15, 2010, with a large rectus sheath hematoma that occurred in the setting of a supratherapeutic INR of 7.0.  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  At the time of his stroke in 2010, he was noted to have a moderate patent foramen ovale with right-to-left shunting and associated atrial septal aneurysm.  By report, he declined closure of his PFO at that time.  The patient has been maintained on warfarin ever since then.  To my knowledge, he has not had a recurrent stroke.  The patient is now a resident at a skilled nursing facility.  He uses a wheelchair to get around.  He has no other acute complaints today, but on admission, he had multiple complaints including shortness of breath, nausea, and vomiting.  Following correction of the patient's supratherapeutic INR with administration of FFP and packed red blood cell transfusion, the patient developed an acute left lower extremity DVT.  He subsequently underwent IVC filter placement using an OptEase retrievable IVC filter.  I was asked to evaluate James Villegas considering his hypercoagulability and recent bleeding complication from warfarin as a potential candidate for transcatheter closure of his patent foramen ovale.  PAST MEDICAL HISTORY: 1. 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 2. 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 4. Hypertension. 5. Hyperlipidemia. 6. Obesity. 7. Depression. 8. Morbid obesity.  FAMILY HISTORY:  Mother died at 48 of a brain tumor.  Father died at 82 from a gunshot wound.  There is no family history of premature CAD.  SOCIAL HISTORY:  The patient lives at [facility]  He is a former smoker.  He does not currently smoke or drink alcohol.  REVIEW OF SYSTEMS:  Negative except as per HPI.  CURRENT MEDICATIONS: 1. Celexa 40 mg daily. 2. Fenofibrate 54 mg daily. 3. Gabapentin 300 mg three times daily. 4. Intravenous heparin. 5. Metoprolol 25 mg twice daily. 6. Protonix 40 mg daily. 7. Dilantin 100 mg at bedtime. 8. Crestor 40 mg daily.  ALLERGIES:  NKDA.  PHYSICAL EXAMINATION:  GENERAL:  The patient is alert and oriented.  He is in no acute distress.  He is obese. VITAL SIGNS:  Heart rate is 88-101, temperature is 98.6, blood pressure is 104/74, respiratory rate is 20-22, oxygen saturation 100% on 2 liters. HEENT:  Normal. NECK:  Normal carotid upstrokes.  No bruits.  JVP normal.  Nothyromegaly or thyroid nodules. LUNGS:  Clear bilaterally. HEART:  The apex is nonpalpable.  The heart is regular rate and rhythm. There  are no murmurs or gallops. ABDOMEN:  Soft, obese, nontender. BACK:  No CVA tenderness. EXTREMITIES:  There is a 2+ edema in the left leg, 1+ edema in the right leg. SKIN:  Warm and dry without rash. NEUROLOGIC:  The right arm has 1/5 strength.  The right leg has 3/5 strength.  Left side has normal strength in the arm and leg.  Transesophageal echo dated July 18, 2008, was reviewed.  I have reviewed the report and then also reviewed the images.  There is a moderate-to-large PFO with right-to-left shunting with agitated saline. There is also aneurysm in the interatrial septum noted.  I did not appreciate any left-to-right shunting.  The left ventricular ejection fraction was normal at  60%.  Echo from December 16, 2010, showed a vigorous LV function with an EF of 65- 70%.  There was mild mitral regurgitation noted.  Most recent INR was 1.17.  Heparin level earlier today was 0.39.  Hemoglobin is 8.1; platelet count is 344,000.  ASSESSMENT:  This is a 55 year old gentleman with a moderate-to-large patent foramen ovale with history of previous stroke, hypercoagulable state with lupus anticoagulant and protein C and S deficiency, large rectus sheath hematoma in the setting of supratherapeutic INR, and acute deep venous thrombosis after reversal of anticoagulation.  While the patient is fairly complex with his multiple comorbidities related to hypercoagulability and now bleeding, he has not had a recurrent cardioembolic event since his stroke in 2010.  Considering his recent bleed and acute DVT with placement of an IVC filter, I think the best course is to resume Coumadin and allow the patient 4-6 weeks to recover.  This will also allow his IVC filter to endothelialized.  At that point, we certainly could consider transcatheter closure of his patent foramen ovale.  Rationale to close his PFO would be that he has a hypercoagulable state and now may have major difficulty tolerating long- term Coumadin.  However, there are plans to restart his Coumadin now that his bleed has resolved and his hemoglobin is stable.  I would like to see him back in about 4 weeks in the office to further discuss the rationale for PFO closure.  In my opinion, this may be the best way to minimize his risk of long-term paradoxical emboli considering his underlying hypercoagulability.  This can be performed with delivery of the device through the IVC filter without removal of the filter.  I discussed all this in detail with the patient and he seems to understand.  We will follow throughout the remainder of his hospitalization and we will arrange further office evaluation once he has been fully treated  from his acute DVT.     Veverly Fells. Excell Seltzer, MD     MDC/MEDQ  D:  12/23/2010  T:  12/24/2010  Job:  [id number]  Electronically Signed by Tonny Bollman MD on 12/26/2010 12:47:32 AM

## 2010-12-27 ENCOUNTER — Inpatient Hospital Stay (HOSPITAL_COMMUNITY): Payer: Medicaid Other

## 2010-12-28 ENCOUNTER — Inpatient Hospital Stay (HOSPITAL_COMMUNITY): Payer: Medicaid Other

## 2010-12-28 LAB — URINALYSIS, ROUTINE W REFLEX MICROSCOPIC
Hgb urine dipstick: NEGATIVE
Ketones, ur: NEGATIVE mg/dL
Protein, ur: NEGATIVE mg/dL
Urobilinogen, UA: 1 mg/dL (ref 0.0–1.0)

## 2010-12-28 LAB — BASIC METABOLIC PANEL
BUN: 14 mg/dL (ref 6–23)
CO2: 26 mEq/L (ref 19–32)
Chloride: 102 mEq/L (ref 96–112)
Creatinine, Ser: 0.57 mg/dL (ref 0.50–1.35)
GFR calc Af Amer: >60 mL/min (ref >60)

## 2010-12-28 LAB — CBC
HCT: 21.2 % — ABNORMAL LOW (ref 39.0–52.0)
MCHC: 32.5 g/dL (ref 30.0–36.0)
RDW: 16.1 % — ABNORMAL HIGH (ref 11.5–15.5)

## 2010-12-28 LAB — PROTIME-INR: Prothrombin Time: 15.7 seconds — ABNORMAL HIGH (ref 11.6–15.2)

## 2010-12-28 LAB — HEPARIN LEVEL (UNFRACTIONATED): Heparin Unfractionated: 0.38 IU/mL (ref 0.30–0.70)

## 2010-12-29 LAB — DIFFERENTIAL
Lymphocytes Relative: 10 % — ABNORMAL LOW (ref 12–46)
Monocytes Absolute: 1.1 10*3/uL — ABNORMAL HIGH (ref 0.1–1.0)
Monocytes Relative: 9 % (ref 3–12)
Neutro Abs: 9.8 10*3/uL — ABNORMAL HIGH (ref 1.7–7.7)

## 2010-12-29 LAB — PROTIME-INR: INR: 1.24 (ref 0.00–1.49)

## 2010-12-29 LAB — COMPREHENSIVE METABOLIC PANEL
ALT: 36 U/L (ref 0–53)
Albumin: 2.4 g/dL — ABNORMAL LOW (ref 3.5–5.2)
Alkaline Phosphatase: 82 U/L (ref 39–117)
BUN: 14 mg/dL (ref 6–23)
Chloride: 100 mEq/L (ref 96–112)
Potassium: 4 mEq/L (ref 3.5–5.1)
Sodium: 133 mEq/L — ABNORMAL LOW (ref 135–145)
Total Bilirubin: 0.7 mg/dL (ref 0.3–1.2)

## 2010-12-29 LAB — CBC
HCT: 24.4 % — ABNORMAL LOW (ref 39.0–52.0)
Hemoglobin: 7.9 g/dL — ABNORMAL LOW (ref 13.0–17.0)
MCH: 28.5 pg (ref 26.0–34.0)
MCHC: 32.4 g/dL (ref 30.0–36.0)
RBC: 2.77 MIL/uL — ABNORMAL LOW (ref 4.22–5.81)

## 2010-12-29 LAB — CROSSMATCH: Unit division: 0

## 2010-12-29 LAB — CLOSTRIDIUM DIFFICILE BY PCR: Toxigenic C. Difficile by PCR: NEGATIVE

## 2010-12-30 DIAGNOSIS — I82409 Acute embolism and thrombosis of unspecified deep veins of unspecified lower extremity: Secondary | ICD-10-CM

## 2010-12-30 LAB — CBC
Hemoglobin: 7.8 g/dL — ABNORMAL LOW (ref 13.0–17.0)
Platelets: 288 10*3/uL (ref 150–400)
RBC: 2.75 MIL/uL — ABNORMAL LOW (ref 4.22–5.81)
WBC: 10.3 10*3/uL (ref 4.0–10.5)

## 2010-12-30 LAB — BASIC METABOLIC PANEL
CO2: 25 mEq/L (ref 19–32)
Glucose, Bld: 106 mg/dL — ABNORMAL HIGH (ref 70–99)
Potassium: 3.7 mEq/L (ref 3.5–5.1)
Sodium: 137 mEq/L (ref 135–145)

## 2010-12-30 LAB — PROTIME-INR
INR: 1.16 (ref 0.00–1.49)
Prothrombin Time: 15 seconds (ref 11.6–15.2)

## 2010-12-31 LAB — CBC
Hemoglobin: 7.6 g/dL — ABNORMAL LOW (ref 13.0–17.0)
MCH: 27.5 pg (ref 26.0–34.0)
MCV: 88.8 fL (ref 78.0–100.0)
RBC: 2.76 MIL/uL — ABNORMAL LOW (ref 4.22–5.81)
WBC: 9.7 10*3/uL (ref 4.0–10.5)

## 2010-12-31 NOTE — Discharge Summary (Signed)
NAME:  James Villegas, James Villegas NO.:  [id number]  MEDICAL RECORD NO.:  [id number]  LOCATION:  5511                         FACILITY:  [facility]  PHYSICIAN:  Standley Dakins, MD   DATE OF BIRTH:  1956/03/14  DATE OF ADMISSION:  12/15/2010 DATE OF DISCHARGE:  12/31/2010                        DISCHARGE SUMMARY - REFERRING   PRIMARY CARE PHYSICIAN:  Karlene Einstein, MD, General Internal Medicine.  HEMATOLOGIST:  Exie Parody, MD  CARDIOTHORACIC SURGEON:  Veverly Fells. Excell Seltzer, MD  DISCHARGE DIAGNOSES: 1. Coagulopathy with decreased protein C and S. 2. Positive lupus anticoagulant. 3. History of right lower extremity deep venous thrombosis. 4. Acute left lower extremity deep venous thrombosis. 5. Cerebrovascular disease status post large left middle cerebral     artery and small distal anterior cerebral artery infarct in     February 2010. 6. Status post IVC inferior vena cava filter placement. 7. Hypertension. 8. Anemia.9. Hyperlipidemia. 10.Depression. 11.Anxiety disorder. 12.Patent foramen ovale with moderate right to left shunting. 13.Dysphagia history. 14.Seizure disorder. 15.Narrow urethral meatus making Foley catheter placement difficult. 16.Chronic constipation. 17.Morbid obesity. 18.Multiple hematomas involving the left rectus sheath, left pelvic     sidewall, and left upper thigh. 19.Coagulopathy with warfarin - supratherapeutic PT - corrected now,     holding warfarin at this time.  DISCHARGE MEDICATIONS: 1. Acetaminophen 325 mg 1-2 tablets p.o. q.4 h. p.r.n. pain. 2. Enoxaparin 130 mg subcu twice daily. 3. Nu-Iron 150 mg one p.o. twice daily. 4. Metoprolol 25 mg one p.o. b.i.d. 5. MiraLax 17 g p.o. daily. 6. Senokot 2 tablets by mouth p.r.n. constipation. 7. Baclofen 20 mg 1 tablet p.o. t.i.d. 8. Citalopram 20 mg 2-1/2 tablets p.o. daily. 9. Crestor 40 mg 1 tablet daily at bedtime. 10.Docusate 100 mg 1 capsule by mouth twice daily. 11.Gabapentin 300  mg 1 capsule by mouth t.i.d. 12.Omeprazole 20 mg 1 capsule by mouth daily. 13.Phenytoin 100 mg capsules 1 tablet by mouth daily at bedtime. 14.Trazodone 100 mg 1 tablet by mouth daily at bedtime as needed for     insomnia. 15.TriCor 48 mg 1 tablet p.o. daily.  HOSPITAL COURSE:  Briefly, this patient is a 55 year old gentleman with a history of protein C and protein S deficiency giving him a hypercoagulable state notable for a cerebrovascular accident that occurred as mentioned above and a right lower extremity DVT.  He had been on long-term chronic anticoagulation with warfarin.  He presented to the emergency department complaining of progressive abdominal pain involving the left lower quadrant.  He was found to have large hematomas as mentioned above.  His PT was elevated and his INR was greater than 7.0.    He was admitted into the hospital, he received vitamin K and fresh frozen plasma.  His PT was totally corrected to normal while we were waiting for the hematoma to stabilize.  Unfortunately, the patient developed an acute DVT of the left lower extremity, discovered when he complained of pain and swelling in the left leg.  At that point, a hematologist was consulted and recommended an IVC filter be placed.  The patient receives that IVC filter and tolerated the procedure well.  The patient was restarted on heparin when it  was determined that the hematoma had been stable after several days, no increase in size or severity noted.  The patient tolerated the heparin with no difficulties.   The hematologist recommended that the patient go home on Lovenox 1 mg/kg b.i.d. until the hematoma had resolved, and at that point, he will most likely be started back on Coumadin again and will need to be on Coumadin for life.  The patient is going to be followed up by the hematologist on an outpatient basis after discharge.  In addition, the patient was seen by the cardiothoracic surgeon, Dr.  Excell Seltzer, in regard to his patent foramen ovale.  The doctor recommended that the patient consider having the PFO repaired given that the patient has a hypercoagulable state and is at high risk for developing a blood clot involving the PFO that can cause significant morbidity and mortality.  The patient is scheduled to see Dr. Excell Seltzer in August to revisit having the PFO closed.  I am making the patient an appointment to see Dr. Excell Seltzer as outpatient.  We monitored the patient for any signs of recurrent bleeding on heparin, and I did a repeat CT scan that showed that the hematoma has remained stable.  At that point, it was considered reasonable to discharge the patient.  He was transitioned to subcut Lovenox and provided Lovenox education.    I am recommending that he have a CBC checked weekly while he is on Lovenox.  In addition, I would like for him to have a repeat CBC drawn in approximately 2-3 days.  CBC results may be reported to his hematologist, Dr. Gaylyn Rong.  The patient reported improvement in the abdominal pain that he had been experiencing.  He also had improvement in the left leg swelling and pain that he experienced with the acute DVT in the left lower extremity.  DISCHARGE CONDITION:  Stable.  DISPOSITION:  The patient will be discharged to a skilled nursing facility for long-term care.  ACTIVITY:  Ad lib.  FOLLOWUP:  The patient will be following up with his hematologist, Dr. Gaylyn Rong, within 2-3 weeks.  I have recommended that the patient follow up with Dr. Excell Seltzer in August 2012 as scheduled.  He has an appointment on February 04, 2011, at 2:30 p.m.  Dr. Excell Seltzer is with [facility]. Dr. Gaylyn Rong is with the [facility].  Follow up with primary care physician in 1 month.  Follow up with physician at the skilled nursing facility on arrival.  DIET:  Carbohydrate consistent low-fat, low-cholesterol diet recommended.  SPECIAL INSTRUCTIONS: 1. The patient is going to need  to have a CBC drawn and checked in 2-3     days.  The patient should have at least a CBC drawn once per week     while on Lovenox.  The patient is going to be following up closely     with his hematologist to determine when he will go back on     Coumadin.  The hematoma will need to have been resolved before     starting back on Coumadin.  The patient will most likely be on     Coumadin for life. 2. Return if symptoms recur, worsen, or new changes develop. 3. The patient was evaluated on December 31, 2010.  PHYSICAL EXAMINATION:  GENERAL:  He is awake, alert, in no distress, cooperative, and pleasant. VITAL SIGNS:  Temperature 99.0, pulse 87, respirations 20, blood pressure 103/66, pulse ox 94% on room air. GENERAL:  A well-developed,  well-nourished male, awake, alert, in no distress, cooperative and pleasant. HEENT:  Normocephalic, atraumatic.  Mucous membranes moist and wet. NECK:  Supple.  No lymphadenopathy or JVD. LUNGS:  Bilateral breath sounds clear to auscultation. CARDIAC:  Normal S1 and S2 sounds. ABDOMEN:  Obese, soft, nondistended, bowel sounds present. EXTREMITIES:  Trace pretibial edema in bilateral lower extremities.  LABORATORY DATA:  White blood cell count 9.7, hemoglobin 7.6, hematocrit 24.5, platelet count 270.  The patient has been monitored daily with CBCs in the hospital, and his blood count has remained stable.    I spent 45 minutes preparing this patient's discharge, reviewing  his extensive hospitalization records, consultation notes, labs and imaging studies, reconciling medications, and dictating  a discharge summary.     Standley Dakins, MD     CJ/MEDQ  D:  12/31/2010  T:  12/31/2010  Job:  [id number]  cc:   Karlene Einstein, M.D. Exie Parody, M.D. Veverly Fells. Excell Seltzer, MD  Electronically Signed by Standley Dakins  on 12/31/2010 10:47:10 PM

## 2011-01-04 NOTE — Progress Notes (Signed)
NAME:  James Villegas, James Villegas NO.:  [id number]  MEDICAL RECORD NO.:  [id number]  LOCATION:  3305                         FACILITY:  [facility]  PHYSICIAN:  Lonia Blood, M.D.DATE OF BIRTH:  [birth year]                                PROGRESS NOTE   PRIMARY CARE PHYSICIAN: [facility], Karlene Einstein, M.D.  ACTIVE DIAGNOSES AT THE PRESENT TIME: 1. Coumadin-induced coagulopathy.     a.     Reversed with transfusion of fresh frozen plasma.     b.     Ultimately decision will need to be made on resumption of      anticoagulation prior to discharge from the hospital.     c.     INR of 1.17 at present time. 2. Mild upper GI bleed.     a.     Seen by Gastroenterology.     b.     No intervention felt to be necessary.     c.     Proton-pump inhibitor as medical therapy. 3. Anemia secondary to acute blood loss.     a.     Status post transfusion of packed red blood cells.     b.     Hemoglobin stable at a 8.4 today. 4. Extraperitoneal hemorrhage within the left pelvis and left     abdominal wall rectus sheath.     a.     Secondary to spontaneous bleeding in the face of INR of      7.05.     b.     Required transfusion of packed red blood cells.     c.     No significant change in size on followup CT scan December 17, 2010. 5. Hypokalemia - replacing. 6. Positive lupus anticoagulant with additional protein C and S     deficiency with prior history of right lower extremity DVT -     previously committed to long-term anticoagulation to the time     lifelong anticoagulation. 7. Known patent foramen ovale with moderate right and left     intracardiac shunt.  PAST HISTORY: 1. Dysphagia. 2. History of CVA with a large left middle cerebral artery and small     distal anterior cerebral artery infarcts in 2010. 3. Hyperlipidemia. 4. Depression. 5. Anxiety disorder. 6. Ileus - appears to have spontaneously resolved at the present time. 7. Mildly elevated cardiac  enzymes.     a.     Status post cardiology consultation.     b.     No further risk stratification to be carried out.     c.     Felt to be minimal stress enzyme leak related to bleed and      hypertension.     d.     Medical treatment only.  CONSULTATION: 1. [facility]. 2. Cardiology on call.  PROCEDURES: 1. CT scan of the abdomen and pelvis December 15, 2010 - moderate     extraperitoneal hemorrhage within the left pelvis and left     abdominal wall rectus sheath. 2. CT scan of abdomen and pelvis December 17, 2010 - no significant change  in large subacute hematomas involving the left rectus sheath and     left pelvis.  Significant persistent rightward displacement of the     urinary bladder and prostate gland.  Findings consistent with an     ileus. 3. KUB on December 18, 2010 - persistent ileus.  HOSPITAL COURSE TO DATE: [name] is a pleasant, but unfortunate 55 year old gentleman with a very complex past medical history.  He was admitted to hospital on December 15, 2010 after he presented with abdominal pain.  CT scan of the abdomen and pelvis revealed evidence of a significant extraperitoneal hemorrhage as detailed above.  He is found to have an INR 7.05.  Despite the patient's protein C and S deficiency and known lupus anticoagulant, there was no choice, but to reverse of the patient's anticoagulation. He was dosed with fresh frozen plasma via transfusion.  He also required transfusion of packed red blood cells given his acute blood loss anemia. The patient stabilized from a hemodynamic standpoint.  He then however began to experience significant nausea and vomiting.  X-rays and physical exam were consistent with an ileus.  This was felt to be related to the patient's significant extraperitoneal hemorrhaging. With multiple episodes of vomiting, the patient did experience a couple of episodes of coffee-ground emesis.  As a result, GI was consulted.  GI does not feel that any  aggressive intervention or further evaluation is indicated at this time.  Our plan is to continue medical therapy. Additionally, the patient ileus has resolved today with multiple bowel movements.  We are therefore that the patient's diet.  During the patient's hospital stay, a very mild elevation of a cardiac enzymes has also been appreciated.  This prompted a cardiology consultation.  Cardiology feels this likely is very minimal leak possibly related to cardiac stress and no further evaluation is indicated.  Medical management is being administered with specific recommendation to up titrate his beta-blocker as he is able to tolerate.  At the present time, the patient appears to be stabilizing.  We are advancing his diet and watching his intake for signs of recurrent ileus, nausea or vomiting.  We are also monitoring his hemoglobin on a serial basis.  Prior to the patient's discharge, we will need to make a determination as to when would be most appropriate to resume his anticoagulation.  For now, it appears reasonable but this will need to be held for a 10-day period at minimum.  Clearly if it is resumed, the patient will need very close monitoring of his INR in the outpatient setting.  Given his PFO and significant history of CVA despite his young age, I would lean towards the side of resuming his anticoagulation assumed this is felt to be safe.  We cannot however afford the patient developing such a severe coagulopathy as he has proven a tendency to bleed spontaneously.     Lonia Blood, M.D.     JTM/MEDQ  D:  12/18/2010  T:  12/18/2010  Job:  [id number]  Electronically Signed by Jetty Duhamel M.D. on 01/04/2011 04:35:09 PM

## 2011-01-10 ENCOUNTER — Other Ambulatory Visit: Payer: Self-pay | Admitting: Oncology

## 2011-01-10 ENCOUNTER — Encounter (HOSPITAL_BASED_OUTPATIENT_CLINIC_OR_DEPARTMENT_OTHER): Payer: Medicare Other | Admitting: Oncology

## 2011-01-10 DIAGNOSIS — E782 Mixed hyperlipidemia: Secondary | ICD-10-CM

## 2011-01-10 DIAGNOSIS — I82890 Acute embolism and thrombosis of other specified veins: Secondary | ICD-10-CM

## 2011-01-10 DIAGNOSIS — D6859 Other primary thrombophilia: Secondary | ICD-10-CM

## 2011-01-10 DIAGNOSIS — I6350 Cerebral infarction due to unspecified occlusion or stenosis of unspecified cerebral artery: Secondary | ICD-10-CM

## 2011-01-10 LAB — CBC WITH DIFFERENTIAL/PLATELET
BASO%: 0.8 % (ref 0.0–2.0)
Basophils Absolute: 0.1 10*3/uL (ref 0.0–0.1)
HCT: 31.2 % — ABNORMAL LOW (ref 38.4–49.9)
HGB: 9.6 g/dL — ABNORMAL LOW (ref 13.0–17.1)
LYMPH%: 22.5 % (ref 14.0–49.0)
MCH: 28.7 pg (ref 27.2–33.4)
MCHC: 30.8 g/dL — ABNORMAL LOW (ref 32.0–36.0)
MONO#: 0.6 10*3/uL (ref 0.1–0.9)
NEUT%: 61.9 % (ref 39.0–75.0)
Platelets: 219 10*3/uL (ref 140–400)
WBC: 6.1 10*3/uL (ref 4.0–10.3)
lymph#: 1.4 10*3/uL (ref 0.9–3.3)

## 2011-01-10 LAB — PROTIME-INR

## 2011-01-10 LAB — BASIC METABOLIC PANEL
CO2: 28 mEq/L (ref 19–32)
Calcium: 9.2 mg/dL (ref 8.4–10.5)
Chloride: 104 mEq/L (ref 96–112)
Glucose, Bld: 108 mg/dL — ABNORMAL HIGH (ref 70–99)
Sodium: 139 mEq/L (ref 135–145)

## 2011-01-17 ENCOUNTER — Other Ambulatory Visit: Payer: Self-pay | Admitting: Medical

## 2011-01-17 ENCOUNTER — Encounter: Payer: Medicare Other | Admitting: Oncology

## 2011-01-17 LAB — CBC WITH DIFFERENTIAL/PLATELET
Basophils Absolute: 0.1 10*3/uL (ref 0.0–0.1)
EOS%: 4.5 % (ref 0.0–7.0)
Eosinophils Absolute: 0.4 10*3/uL (ref 0.0–0.5)
HCT: 34 % — ABNORMAL LOW (ref 38.4–49.9)
HGB: 10.6 g/dL — ABNORMAL LOW (ref 13.0–17.1)
LYMPH%: 20 % (ref 14.0–49.0)
MCH: 28.9 pg (ref 27.2–33.4)
MCV: 92.6 fL (ref 79.3–98.0)
MONO%: 9.7 % (ref 0.0–14.0)
NEUT#: 5.4 10*3/uL (ref 1.5–6.5)
NEUT%: 64.7 % (ref 39.0–75.0)
Platelets: 254 10*3/uL (ref 140–400)

## 2011-01-24 ENCOUNTER — Other Ambulatory Visit: Payer: Self-pay | Admitting: Medical

## 2011-01-24 ENCOUNTER — Encounter (HOSPITAL_BASED_OUTPATIENT_CLINIC_OR_DEPARTMENT_OTHER): Payer: Medicare Other | Admitting: Oncology

## 2011-01-24 DIAGNOSIS — I82890 Acute embolism and thrombosis of other specified veins: Secondary | ICD-10-CM

## 2011-01-24 DIAGNOSIS — E782 Mixed hyperlipidemia: Secondary | ICD-10-CM

## 2011-01-24 DIAGNOSIS — D6859 Other primary thrombophilia: Secondary | ICD-10-CM

## 2011-01-24 DIAGNOSIS — I6350 Cerebral infarction due to unspecified occlusion or stenosis of unspecified cerebral artery: Secondary | ICD-10-CM

## 2011-01-24 LAB — CBC WITH DIFFERENTIAL/PLATELET
BASO%: 0.4 % (ref 0.0–2.0)
HCT: 33.5 % — ABNORMAL LOW (ref 38.4–49.9)
LYMPH%: 19.6 % (ref 14.0–49.0)
MCH: 29.9 pg (ref 27.2–33.4)
MCHC: 33.6 g/dL (ref 32.0–36.0)
MCV: 89.1 fL (ref 79.3–98.0)
MONO#: 0.7 10*3/uL (ref 0.1–0.9)
MONO%: 8.7 % (ref 0.0–14.0)
NEUT%: 64.3 % (ref 39.0–75.0)
Platelets: 265 10*3/uL (ref 140–400)
RBC: 3.76 10*6/uL — ABNORMAL LOW (ref 4.20–5.82)
WBC: 7.7 10*3/uL (ref 4.0–10.3)

## 2011-01-24 LAB — COMPREHENSIVE METABOLIC PANEL
ALT: 9 U/L (ref 0–53)
Alkaline Phosphatase: 86 U/L (ref 39–117)
CO2: 24 mEq/L (ref 19–32)
Creatinine, Ser: 0.7 mg/dL (ref 0.50–1.35)
Glucose, Bld: 86 mg/dL (ref 70–99)
Total Bilirubin: 0.5 mg/dL (ref 0.3–1.2)

## 2011-02-04 ENCOUNTER — Encounter: Payer: Medicare Other | Admitting: Cardiovascular Disease

## 2011-02-17 ENCOUNTER — Encounter: Payer: Self-pay | Admitting: Physician Assistant

## 2011-02-18 ENCOUNTER — Ambulatory Visit (INDEPENDENT_AMBULATORY_CARE_PROVIDER_SITE_OTHER): Payer: Medicare Other | Admitting: Physician Assistant

## 2011-02-18 ENCOUNTER — Encounter: Payer: Self-pay | Admitting: Physician Assistant

## 2011-02-18 VITALS — BP 92/66 | HR 69 | Ht 73.0 in | Wt 242.0 lb

## 2011-02-18 DIAGNOSIS — Q2112 Patent foramen ovale: Secondary | ICD-10-CM | POA: Insufficient documentation

## 2011-02-18 DIAGNOSIS — Q211 Atrial septal defect: Secondary | ICD-10-CM

## 2011-02-18 NOTE — Progress Notes (Signed)
History of Present Illness: Primary Cardiologist: Dr. Tonny Bollman  James Villegas is a 55 y.o. male who presents for post hospital follow up.    He has a complex past medical history.  He has a history of hypercoagulable state with protein C and protein S deficiency as well as a DVT with positive lupus anticoagulant.  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  He resides in a skilled nursing facility.  He was noted to have a patent foramen ovale at the time of his stroke but declined closure at that time.  He has been on warfarin since.    He was admitted in July 2012 with a large rectus sheath hematoma and a supratherapeutic INR at 7.0.  He was treated with vitamin K and FFP and transfused with packed red blood cells.  His INR decreased but he then developed a left lower extremity DVT.  He underwent IVC filter placement with an Optease retrievable filter.  He had a prolonged hospitalization that was complicated by ileus and upper GI bleeding.  Gastroenterology saw the patient and recommended conservative management.  He also had mildly elevated cardiac enzymes.  I believe he saw Dr. Sharyn Lull.  It was felt that he probably suffered a type II NSTEMI in the setting of profound anemia.  Beta blocker therapy was recommended.  No further testing was done.  Hematology also saw the patient and recommended discharge on Lovenox.  He was to resume Coumadin once his hematoma had resolved.    Dr. Excell Seltzer was asked to see the patient due to his PFO and recent problems with anticoagulant therapy.  He recommended allowing 4-6 weeks for recovery and then considering closure of his PFO in the future.  It was thought that closure of his PFO would provide him the best chance of minimizing risk of paradoxical emboli.  The patient was to see Dr. Excell Seltzer a couple of weeks ago but did not make that appointment.  He is following up with me today instead.  He denies chest pain  or dyspnea.  He is wheel chair bound.  He has dense right hemiparesis.  He is currently on Lovenox BID.  He is followed by Dr. Gaylyn Rong of hematology.  He does not recall meeting Dr. Excell Seltzer in the hospital.  He states his sister who is his healthcare POA would like to speak to Dr. Excell Seltzer.  I spoke with Dr. Excell Seltzer today and he does not feel like PFO closure should be pursued at this time.  Past Medical History  Diagnosis Date  . 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 Lupus anticoagulant disorder 1990  . DVT of lower extremity (deep venous thrombosis)     RIGHT LOWER; s/p IVC filter 7/12  . Dysphagia   . Hyperlipidemia   . Depression   . Anxiety disorder   . Hypertension   . Anemia, secondary     SECONDARY TO ACUTE BLOOD LOSS  . Seizure disorder   . Morbid obesity   . Protein C deficiency   . Protein S deficiency   . PFO (patent foramen ovale)     TEE 2/10: EF 60%, trivial AI, mild Ao root dilatation, mod PFO with R-L shunting, atrial septal aneurysm;   echo 7/12: EF 65-70%, grade 1 diast dysfxn, mild MR, LVOT showed severe obstruction  . Rectus sheath hematoma 7/12    required reversal of anticoagulation  and c/b DVT req. IVC filter    Current Outpatient Prescriptions  Medication Sig Dispense Refill  . acetaminophen (TYLENOL) 325 MG tablet Take 650 mg by mouth every 4 (four) hours as needed.        . baclofen (LIORESAL) 20 MG tablet Take 20 mg by mouth 3 (three) times daily.        . citalopram (CELEXA) 40 MG tablet Take 40 mg by mouth daily.        Marland Kitchen docusate sodium (COLACE) 100 MG capsule Take 100 mg by mouth 2 (two) times daily.        Marland Kitchen enoxaparin (LOVENOX) 100 MG/ML SOLN Inject into the skin every 12 (twelve) hours.        . feeding supplement (PRO-STAT SUGAR FREE 64) LIQD Take 30 mLs by mouth 2 (two) times daily.        Marland Kitchen gabapentin (NEURONTIN) 300 MG capsule Take 300 mg by mouth 3 (three) times daily.        . metoprolol tartrate  (LOPRESSOR) 25 MG tablet Take 25 mg by mouth 2 (two) times daily.        Marland Kitchen omeprazole (PRILOSEC) 20 MG capsule Take 20 mg by mouth daily.        . phenytoin (DILANTIN) 200 MG ER capsule Take 200 mg by mouth 2 (two) times daily.        . polyethylene glycol powder (MIRALAX) powder Take 17 g by mouth daily.        . polysaccharide iron (NIFEREX) 150 MG CAPS capsule Take 150 mg by mouth 2 (two) times daily.        . rosuvastatin (CRESTOR) 40 MG tablet Take 40 mg by mouth daily.        Marland Kitchen senna (SENOKOT) 8.6 MG tablet Take 1 tablet by mouth 2 (two) times daily.        . traZODone (DESYREL) 100 MG tablet Take 100 mg by mouth at bedtime.          Allergies: Allergies  Allergen Reactions  . Fenofibrate     Vital Signs: BP 92/66  Pulse 69  Ht 6\' 1"  (1.854 m)  Wt 242 lb (109.77 kg)  BMI 31.93 kg/m2  PHYSICAL EXAM: Chronically ill appearing male in no acute distress in a wheelchair HEENT: normal Neck: no JVD at 90 degrees Cardiac:  normal S1, S2; RRR; no murmur Lungs:  clear to auscultation bilaterally, no wheezing, rhonchi or rales Abd: soft, nontender, no hepatomegaly Ext: trace bilateral edema Skin: warm and dry Neuro:  Dense right hemiparesis noted  EKG:  Sinus rhythm, heart rate 69, normal axis, right bundle branch block, nonspecific ST-T wave changes  ASSESSMENT AND PLAN:

## 2011-02-18 NOTE — Assessment & Plan Note (Addendum)
I discussed his case with Dr. Excell Seltzer by telephone today.  Dr. Excell Seltzer currently feels that the best option for the patient at this time is to continue chronic anticoagulation therapy.  There may be a slightly increased risk of PFO closure when compared to continuing anticoagulant therapy.  He can followup with cardiology on a p.r.n. basis.  The patient would like Dr. Excell Seltzer to contact his sister to discuss his case.  She is his health care power of attorney.  Her name is Hill Mackie and her phone number is [phone].  ADDENDUM (02/28/11): I have tried to call patient's POA twice and have left messages with her. Plan is to continue to treat medically and follow-up as needed.

## 2011-02-18 NOTE — Patient Instructions (Signed)
Your physician recommends that you schedule a follow-up appointment in: AS PER DR. Excell Seltzer AND SCOTT WEAVER, PA-C YOU JUST NEED TO FOLLOW UP AS NEEDED.  NO CHANGES MADE TODAY

## 2011-03-05 ENCOUNTER — Telehealth: Payer: Self-pay | Admitting: Cardiovascular Disease

## 2011-03-05 NOTE — Telephone Encounter (Signed)
I spoke with the pt's sister [name] [phone]) and made her aware of the documentation from the pt's recent office visit.  She did also want to speak with Dr Excell Seltzer and said that he had tried to reach her a few times. She had questions about Lovenox versus coumadin.  I will forward this message to Dr Excell Seltzer.

## 2011-03-05 NOTE — Telephone Encounter (Signed)
Pt's sister is calling back please call on cell phone number

## 2011-06-20 ENCOUNTER — Ambulatory Visit (INDEPENDENT_AMBULATORY_CARE_PROVIDER_SITE_OTHER): Payer: Medicare Other | Admitting: Cardiovascular Disease

## 2011-06-20 ENCOUNTER — Encounter: Payer: Self-pay | Admitting: Cardiovascular Disease

## 2011-06-20 VITALS — BP 110/70 | HR 60 | Ht 73.0 in | Wt 267.0 lb

## 2011-06-20 DIAGNOSIS — Q211 Atrial septal defect: Secondary | ICD-10-CM

## 2011-06-20 NOTE — Patient Instructions (Signed)
Your physician recommends that you schedule a follow-up appointment as needed  

## 2011-06-20 NOTE — Assessment & Plan Note (Signed)
After review of the patient's clinical situation, I have recommended continuation of medical treatment with full anticoagulation for this gentleman with hypercoagulable disorder, patent foramen ovale, and history of stroke. He is followed by hematology and I am not sure if he is going to continue on long-term Lovenox injections versus an oral anti-thrombin. I would be happy to see him back on an as needed basis. I do not think device closure of his PFO as indicated and I would be concerned about placing a device in the setting of his hypercoagulable disorder.

## 2011-06-20 NOTE — Progress Notes (Signed)
HPI:  This is a 56 year old gentleman presenting for followup evaluation of a patent foramen ovale and history of stroke. The patient had a left MCA stroke in 2010 and was noted to have a PFO with atrial septal aneurysm at that time. He had been maintained on long-term anticoagulation with warfarin but presented in 2012 with a spontaneous rectus sheath hematoma.  The patient has an underlying hypercoagulable disorder with a history of protein C and S. Deficiency as well as positive lupus anticoagulant. He is now maintained on Lovenox and has had no further bleeding problems. He has had no recurrent stroke or TIA symptoms. He is physically very limited and is essentially wheelchair-bound because of residual right-sided hemiparesis.  The patient denies chest pain, dyspnea, or edema.  Outpatient Encounter Prescriptions as of 06/20/2011  Medication Sig Dispense Refill  . acetaminophen (TYLENOL) 325 MG tablet Take 650 mg by mouth every 4 (four) hours as needed.        . baclofen (LIORESAL) 20 MG tablet Take 20 mg by mouth 3 (three) times daily.        . citalopram (CELEXA) 40 MG tablet Take 40 mg by mouth daily.        Marland Kitchen docusate sodium (COLACE) 100 MG capsule Take 100 mg by mouth 2 (two) times daily.        Marland Kitchen enoxaparin (LOVENOX) 100 MG/ML SOLN Inject into the skin every 12 (twelve) hours.        Marland Kitchen ezetimibe (ZETIA) 10 MG tablet Take 10 mg by mouth daily.      . feeding supplement (PRO-STAT SUGAR FREE 64) LIQD Take 30 mLs by mouth 2 (two) times daily.        Marland Kitchen gabapentin (NEURONTIN) 300 MG capsule Take 300 mg by mouth 3 (three) times daily.        . hydrocortisone (ANUSOL-HC) 2.5 % rectal cream Place 1 application rectally 2 (two) times daily.      . metoprolol tartrate (LOPRESSOR) 25 MG tablet Take 25 mg by mouth 2 (two) times daily.       Marland Kitchen omeprazole (PRILOSEC) 20 MG capsule Take 20 mg by mouth daily.        . phenytoin (DILANTIN) 200 MG ER capsule Take 200 mg by mouth 2 (two) times daily.        .  polyethylene glycol powder (MIRALAX) powder Take 17 g by mouth daily.        . polysaccharide iron (NIFEREX) 150 MG CAPS capsule Take 150 mg by mouth 2 (two) times daily.        . rosuvastatin (CRESTOR) 40 MG tablet Take 40 mg by mouth daily.        Marland Kitchen senna (SENOKOT) 8.6 MG tablet Take 1 tablet by mouth 2 (two) times daily.        . traZODone (DESYREL) 100 MG tablet Take 100 mg by mouth at bedtime.          Allergies  Allergen Reactions  . Fenofibrate     Past Medical History  Diagnosis Date  . 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 Lupus anticoagulant disorder 1990  . DVT of lower extremity (deep venous thrombosis)     RIGHT LOWER; s/p IVC filter 7/12  . Dysphagia   . Hyperlipidemia   . Depression   . Anxiety disorder   . Hypertension   . Anemia, secondary     SECONDARY TO ACUTE BLOOD LOSS  .  Seizure disorder   . Morbid obesity   . Protein C deficiency   . Protein S deficiency   . PFO (patent foramen ovale)     TEE 2/10: EF 60%, trivial AI, mild Ao root dilatation, mod PFO with R-L shunting, atrial septal aneurysm;   echo 7/12: EF 65-70%, grade 1 diast dysfxn, mild MR, LVOT showed severe obstruction  . Rectus sheath hematoma 7/12    required reversal of anticoagulation and c/b DVT req. IVC filter    ROS: Negative except as per HPI  BP 110/70  Pulse 60  Ht 6\' 1"  (1.854 m)  Wt 121.11 kg (267 lb)  BMI 35.23 kg/m2  PHYSICAL EXAM: Pt is alert and oriented, obese male in NAD HEENT: normal Neck: JVP - normal, carotids 2+= without bruits Lungs: CTA bilaterally CV: RRR without murmur or gallop Abd: soft, NT, Positive BS, obese Ext: the right arm and right leg had 0/5 strength. The muscles of the right leg are atrophied. Skin: warm/dry no rash  ASSESSMENT AND PLAN:

## 2011-08-20 ENCOUNTER — Telehealth: Payer: Self-pay | Admitting: *Deleted

## 2011-08-20 NOTE — Telephone Encounter (Signed)
Received VM from [place] in English as a second language teacher at [employer]. He asks if pt should have an appt for lab to check or continue his Lovenox?  I called to [facility] and was attempting to speak with nurse to ascertain what pt needs.   She was not available.  I left our number for her to return call.  Nurse did return call and states not sure why James Villegas called for appt?  She says pt actually recently dx'd w/ DVT but he is currently on Lovenox and they are managing this.  Instructed her to call us back if pt needs appt w/ Dr. Gaylyn Rong.   She verbalized understanding.

## 2011-08-21 ENCOUNTER — Other Ambulatory Visit: Payer: Self-pay | Admitting: Oncology

## 2011-08-21 ENCOUNTER — Emergency Department (HOSPITAL_COMMUNITY): Payer: Medicare Other

## 2011-08-21 ENCOUNTER — Telehealth: Payer: Self-pay | Admitting: *Deleted

## 2011-08-21 ENCOUNTER — Emergency Department (HOSPITAL_COMMUNITY)
Admission: EM | Admit: 2011-08-21 | Discharge: 2011-08-22 | Disposition: A | Discharge status: Home / Self Care | Payer: Medicare Other | Attending: Emergency Medicine | Admitting: Emergency Medicine

## 2011-08-21 ENCOUNTER — Encounter (HOSPITAL_COMMUNITY): Payer: Self-pay | Admitting: *Deleted

## 2011-08-21 DIAGNOSIS — Z8673 Personal history of transient ischemic attack (TIA), and cerebral infarction without residual deficits: Secondary | ICD-10-CM | POA: Insufficient documentation

## 2011-08-21 DIAGNOSIS — G40909 Epilepsy, unspecified, not intractable, without status epilepticus: Secondary | ICD-10-CM | POA: Diagnosis not present

## 2011-08-21 DIAGNOSIS — I82409 Acute embolism and thrombosis of unspecified deep veins of unspecified lower extremity: Secondary | ICD-10-CM | POA: Insufficient documentation

## 2011-08-21 DIAGNOSIS — E785 Hyperlipidemia, unspecified: Secondary | ICD-10-CM | POA: Diagnosis not present

## 2011-08-21 DIAGNOSIS — M7989 Other specified soft tissue disorders: Secondary | ICD-10-CM | POA: Insufficient documentation

## 2011-08-21 DIAGNOSIS — F341 Dysthymic disorder: Secondary | ICD-10-CM | POA: Diagnosis not present

## 2011-08-21 DIAGNOSIS — Z79899 Other long term (current) drug therapy: Secondary | ICD-10-CM | POA: Insufficient documentation

## 2011-08-21 DIAGNOSIS — M329 Systemic lupus erythematosus, unspecified: Secondary | ICD-10-CM | POA: Diagnosis not present

## 2011-08-21 DIAGNOSIS — E669 Obesity, unspecified: Secondary | ICD-10-CM | POA: Diagnosis not present

## 2011-08-21 DIAGNOSIS — I10 Essential (primary) hypertension: Secondary | ICD-10-CM | POA: Insufficient documentation

## 2011-08-21 LAB — BASIC METABOLIC PANEL
BUN: 12 mg/dL (ref 6–23)
CO2: 25 mEq/L (ref 19–32)
Calcium: 9 mg/dL (ref 8.4–10.5)
Chloride: 105 mEq/L (ref 96–112)
Creatinine, Ser: 0.61 mg/dL (ref 0.50–1.35)
GFR calc Af Amer: >90 mL/min (ref >90)
GFR calc non Af Amer: >90 mL/min (ref >90)
Glucose, Bld: 85 mg/dL (ref 70–99)
Potassium: 4.4 mEq/L (ref 3.5–5.1)
Sodium: 139 mEq/L (ref 135–145)

## 2011-08-21 LAB — CBC
MCH: 30.5 pg (ref 26.0–34.0)
Platelets: 182 10*3/uL (ref 150–400)
RBC: 4.17 MIL/uL — ABNORMAL LOW (ref 4.22–5.81)
RDW: 13.3 % (ref 11.5–15.5)
WBC: 6.4 10*3/uL (ref 4.0–10.5)

## 2011-08-21 LAB — PROTIME-INR: Prothrombin Time: 13.1 seconds (ref 11.6–15.2)

## 2011-08-21 MED ORDER — WARFARIN SODIUM 5 MG PO TABS: 5.0000 mg | ORAL_TABLET | Freq: Every day | ORAL | Status: DC

## 2011-08-21 MED ORDER — IOHEXOL 350 MG/ML SOLN
100.0000 mL | Freq: Once | INTRAVENOUS | Status: AC | PRN
  Administered 2011-08-21: 100 mL via INTRAVENOUS

## 2011-08-21 NOTE — ED Provider Notes (Signed)
History     CSN: [id number]  Arrival date & time 08/21/11  1648   First MD Initiated Contact with Patient 08/21/11 1654      Chief Complaint  Patient presents with  . DVT    right leg/foot edema    (Consider location/radiation/quality/duration/timing/severity/associated sxs/prior treatment) HPI James Villegas is a 56 yo male with a history of CVA with resultant right hemiparesis, and questionable other blood clots, who presents to the ED today after a DVT involving the femoral, popliteal, and proximal calf veins was discovered on U/S.  Pt was sent for the U/S from his SNF where he resides.  Pt states that yesterday morning his aide came in and noticed that his right foot was swollen.  At this point he was sent for tests and when he awoke this morning, he was notified of the clot and sent to the ED.  He is not having any pain, burning, tingling, SOB, wheezing, coughing, chest pain, fever, chills or fatigue.    Past Medical History  Diagnosis Date  . 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 Lupus anticoagulant disorder 1990  . DVT of lower extremity (deep venous thrombosis)     RIGHT LOWER; s/p IVC filter 7/12  . Dysphagia   . Hyperlipidemia   . Depression   . Anxiety disorder   . Hypertension   . Anemia, secondary     SECONDARY TO ACUTE BLOOD LOSS  . Seizure disorder   . Morbid obesity   . Protein C deficiency   . Protein S deficiency   . PFO (patent foramen ovale)     TEE 2/10: EF 60%, trivial AI, mild Ao root dilatation, mod PFO with R-L shunting, atrial septal aneurysm;   echo 7/12: EF 65-70%, grade 1 diast dysfxn, mild MR, LVOT showed severe obstruction  . Rectus sheath hematoma 7/12    required reversal of anticoagulation and c/b DVT req. IVC filter    Past Surgical History  Procedure Date  . Vena cavogram 12/2010    INFERIOR  . Dental extraction     multiple    No family history on file.  History  Substance Use  Topics  . Smoking status: Former Games developer  . Smokeless tobacco: Not on file  . Alcohol Use: No      Review of Systems All pertinent positives and negatives reviewed in the history of present illness  Allergies  Review of patient's allergies indicates no known allergies.  Home Medications   Current Outpatient Rx  Name Route Sig Dispense Refill  . ACETAMINOPHEN 325 MG PO TABS Oral Take 650 mg by mouth every 4 (four) hours as needed.      Marland Kitchen BACLOFEN 20 MG PO TABS Oral Take 20 mg by mouth 3 (three) times daily.      Marland Kitchen CITALOPRAM HYDROBROMIDE 40 MG PO TABS Oral Take 40 mg by mouth daily.      Marland Kitchen DOCUSATE SODIUM 100 MG PO CAPS Oral Take 100 mg by mouth 2 (two) times daily.      Marland Kitchen ENOXAPARIN SODIUM 100 MG/ML [place] SOLN Subcutaneous Inject into the skin every 12 (twelve) hours.      Marland Kitchen EZETIMIBE 10 MG PO TABS Oral Take 10 mg by mouth daily.    Marland Kitchen PRO-STAT 64 PO LIQD Oral Take 30 mLs by mouth 2 (two) times daily.      Marland Kitchen GABAPENTIN 300 MG PO CAPS Oral Take 300 mg by mouth  3 (three) times daily.      Marland Kitchen HYDROCORTISONE 2.5 % RE CREA Rectal Place 1 application rectally 2 (two) times daily.    Marland Kitchen METOPROLOL TARTRATE 25 MG PO TABS Oral Take 25 mg by mouth 2 (two) times daily.     Marland Kitchen OMEPRAZOLE 20 MG PO CPDR Oral Take 20 mg by mouth daily.      Marland Kitchen PHENYTOIN SODIUM EXTENDED 200 MG PO CAPS Oral Take 200 mg by mouth 2 (two) times daily.      Marland Kitchen POLYETHYLENE GLYCOL 3350 PO POWD Oral Take 17 g by mouth daily.      Marland Kitchen POLYSACCHARIDE IRON 150 MG PO CAPS Oral Take 150 mg by mouth 2 (two) times daily.      Marland Kitchen ROSUVASTATIN CALCIUM 40 MG PO TABS Oral Take 40 mg by mouth daily.      . SENNOSIDES 8.6 MG PO TABS Oral Take 1 tablet by mouth 2 (two) times daily.      . TRAZODONE HCL 100 MG PO TABS Oral Take 100 mg by mouth at bedtime.        BP 99/65  Temp(Src) 98 F (36.7 C) (Oral)  Resp 22  SpO2 95%  Physical Exam  Constitutional: He is oriented to person, place, and time. He appears well-developed and  well-nourished. No distress.       Obese   Cardiovascular: Normal rate, regular rhythm, normal heart sounds and intact distal pulses.   Pulmonary/Chest: Effort normal and breath sounds normal. No respiratory distress. He has no wheezes.  Neurological: He is alert and oriented to person, place, and time.  Skin: Skin is warm. No rash noted. He is not diaphoretic. No erythema.    ED Course  Procedures (including critical care time)     Pt seen and assessed.  Records reviewed from prior U/S yesterday.  Labs ordered. 11:52 PM Pt rechecked and he is stable MDM  The patient will be sent back to his facility on Coumadin and advised follow up with his PCP.Told to return here as needed.    MDM Reviewed: nursing note and vitals Interpretation: labs and CT scan            Carlyle Dolly, PA-C 08/22/11 0155

## 2011-08-21 NOTE — Discharge Instructions (Signed)
Have his INR checked 4 days. Follow his doctor for further evaluation.

## 2011-08-21 NOTE — Telephone Encounter (Signed)
Received call and referral from nurse Tina Griffiths at [facility] requesting an urgent consult. Informed Tina Griffiths that urgent request require MD-MD contact, she states that Dr Karlene Einstein needs patient seen ASAP.  Called and spoke to "Dr D",(mobile# [phone]) she explains to me that patient has already seen Dr Gaylyn Rong in the past, is only daily Lovenox for life and just wants to inform Dr Gaylyn Rong that patient has developed another clot while on Lovenox.  She is not expecting to have patient seen in the office, if not necessary for Dr Gaylyn Rong, but could he make any further recommendations and send orders to the [employer] nurse at [facility]. Informed the referring MD that I would share this clarification of referral, however, Dr Gaylyn Rong may need to have patient come in for further anti-coagulopathy studies and work up if indicated. She verbalized understanding. If appt to be made at [facility], please call Windell Hummingbird Nurse at [phone] with time and date of appt.

## 2011-08-21 NOTE — ED Notes (Signed)
Patient with right foot and leg edema x 1 days per patient.  Patient states aid on yesterday noticed increased swelling in right lower leg and foot.  Patient with previous stroke with resulting right sided weakness.  Patient with ultrasound done yesterday and results called today and patient sent to ED for possible DVT.  Patient on Lovenox regime at this time.

## 2011-08-21 NOTE — ED Notes (Signed)
Patient with right lower leg edema and right foot edema, larger than normal per patient, patient with cap refill , 3 seconds, +PS in foot, motor decreased due to previous stroke.

## 2011-08-22 ENCOUNTER — Telehealth: Payer: Self-pay | Admitting: Oncology

## 2011-08-22 NOTE — ED Provider Notes (Signed)
Medical screening examination/treatment/procedure(s) were performed by non-physician practitioner and as supervising physician I was immediately available for consultation/collaboration.   Dayton Bailiff, MD 08/22/11 1007

## 2011-08-22 NOTE — ED Notes (Signed)
Patient to [facility] nursing home via [place]

## 2011-08-22 NOTE — ED Notes (Signed)
[facility] contacted for patient transport 

## 2011-08-22 NOTE — Telephone Encounter (Signed)
called pts transportation [place] @ [phone] and provided appts for 03/22.  d/t per transportation

## 2011-08-28 ENCOUNTER — Other Ambulatory Visit: Payer: Medicare Other | Admitting: Lab

## 2011-08-28 ENCOUNTER — Ambulatory Visit: Payer: Medicare Other | Admitting: Oncology

## 2011-08-29 ENCOUNTER — Telehealth: Payer: Self-pay | Admitting: Oncology

## 2011-08-29 ENCOUNTER — Ambulatory Visit (HOSPITAL_BASED_OUTPATIENT_CLINIC_OR_DEPARTMENT_OTHER): Payer: Medicare Other | Admitting: Oncology

## 2011-08-29 ENCOUNTER — Other Ambulatory Visit: Payer: Self-pay | Admitting: Pharmacist

## 2011-08-29 ENCOUNTER — Encounter: Payer: Self-pay | Admitting: Oncology

## 2011-08-29 ENCOUNTER — Other Ambulatory Visit (HOSPITAL_BASED_OUTPATIENT_CLINIC_OR_DEPARTMENT_OTHER): Payer: Medicare Other | Admitting: Lab

## 2011-08-29 ENCOUNTER — Ambulatory Visit: Payer: Self-pay | Admitting: Pharmacist

## 2011-08-29 VITALS — BP 103/68 | HR 63 | Temp 97.2°F | Ht 73.0 in | Wt 254.6 lb

## 2011-08-29 DIAGNOSIS — I82409 Acute embolism and thrombosis of unspecified deep veins of unspecified lower extremity: Secondary | ICD-10-CM

## 2011-08-29 DIAGNOSIS — K921 Melena: Secondary | ICD-10-CM

## 2011-08-29 DIAGNOSIS — D6859 Other primary thrombophilia: Secondary | ICD-10-CM

## 2011-08-29 DIAGNOSIS — Z7901 Long term (current) use of anticoagulants: Secondary | ICD-10-CM

## 2011-08-29 DIAGNOSIS — I639 Cerebral infarction, unspecified: Secondary | ICD-10-CM

## 2011-08-29 DIAGNOSIS — I82509 Chronic embolism and thrombosis of unspecified deep veins of unspecified lower extremity: Secondary | ICD-10-CM | POA: Diagnosis not present

## 2011-08-29 DIAGNOSIS — Z86718 Personal history of other venous thrombosis and embolism: Secondary | ICD-10-CM

## 2011-08-29 HISTORY — DX: Melena: K92.1

## 2011-08-29 LAB — COMPREHENSIVE METABOLIC PANEL
ALT: 26 U/L (ref 0–53)
AST: 15 U/L (ref 0–37)
Albumin: 3.8 g/dL (ref 3.5–5.2)
Alkaline Phosphatase: 87 U/L (ref 39–117)
Potassium: 4.2 mEq/L (ref 3.5–5.3)
Sodium: 139 mEq/L (ref 135–145)
Total Bilirubin: 0.2 mg/dL — ABNORMAL LOW (ref 0.3–1.2)
Total Protein: 5.9 g/dL — ABNORMAL LOW (ref 6.0–8.3)

## 2011-08-29 LAB — CBC WITH DIFFERENTIAL/PLATELET
BASO%: 1.2 % (ref 0.0–2.0)
EOS%: 5.6 % (ref 0.0–7.0)
Eosinophils Absolute: 0.4 10*3/uL (ref 0.0–0.5)
LYMPH%: 26.5 % (ref 14.0–49.0)
MCHC: 33.8 g/dL (ref 32.0–36.0)
MCV: 90.8 fL (ref 79.3–98.0)
MONO%: 8.1 % (ref 0.0–14.0)
NEUT#: 3.9 10*3/uL (ref 1.5–6.5)
Platelets: 214 10*3/uL (ref 140–400)
RBC: 4.43 10*6/uL (ref 4.20–5.82)
RDW: 13.8 % (ref 11.0–14.6)

## 2011-08-29 LAB — PROTIME-INR: INR: 2.7 (ref 2.00–3.50)

## 2011-08-29 NOTE — Telephone Encounter (Signed)
appts made and printed for pt,pt set up for coumadin clinic a[facility]

## 2011-08-29 NOTE — Progress Notes (Signed)
Updated responsible anticoag party by editing the episode information.

## 2011-08-29 NOTE — Patient Instructions (Addendum)
1.  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  Not candidate for PFO closure.   2.  Lifelong Coumadin with goal INR 2.5 to 3.5.  You are not a good candidate for other forms of blood thinner due to your history of severe bleeding complication.   - INR today is 2.7.  STOP Lovenox today. - Continue Coumadin at 7.5mg  PO daily. - [facility] 10-14 days for INR check. - I will see you in about 4 months.

## 2011-08-29 NOTE — Progress Notes (Signed)
[facility] OFFICE PROGRESS NOTE  Cc:  Karlene Einstein, MD, MD  DIAGNOSIS:    1. 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  2. Patent foramen ovale. 3. History of recurrent DVT's and CVA.  PAST THERAPY:  Coumadin in 2010 when presented with CVA, bilateral PE.  His anticoagulant was switched to Lovenox in July 2012 when he had supratherapeutic INR and large abdominal hematoma.  He was thus placed on Lovenox 130 mg SQ B.i.d while awaiting Dr. Earmon Phoenix decision whether to intervene on his PFO with planned to switch back to Coumadin with close monitoring.  He had another right lower extremity DVT 08/2011.  He was switched back to Coumadin since 08/21/2011.   CURRENT THERAPY:  Coumadin 7.5mg  PO qhs.   INTERVAL HISTORY: James Villegas 55 y.o. male returns for regular follow up.  He was last seen in clinic in 01/2012.  He was lost to follow up since then.  He has been participating in restorative therapy (not quite PT/OT since he was deemed not to benefit from additional PT/OT).  Everyday, he tries to walk with a walker under the supervision of nursing staff.  He saw Dr. Excell Seltzer in Jan 2013 who deemed that intervention on his PFO was not indicated.  With that decision, patient was supposed to follow up with me.  However, he was lost to follow up from what patient attributed to as transportation problem.  He was in [facility] until 08/20/2011 when he developed right foot/ankle swelling.  Outside facility [place] on 08/20/2011 showed femoral vein/popliteal vein/ and proximal calf vein.  I consulted on the phone with [facility] physician who covered Dr. Rhunette Croft on 08/21/2011.  I recommended switching back to Coumadin.  He also had a CT angio on 08/20/2011 which was negative for PE.   He is seen here in clinic today for evaluation with a nursing home care taker.  He reported that with Coumadin, his RLE edema has significantly improved to baseline.  He has  had chronic constipation for many years.  He denied decreased in size of stool caliber.  He has intermittent blood on toilet paper.  He has not seen any GI doctor or has age-appropriate colon cancer screening.  He does not want to use any more injection anticoagulant since he has developed so many knots on his abdomen and they hurt sometimes.  However, he does not have any abdominal pain like in July 2012 when he developed abdominal hematoma.  He does have right side upper and lower extremities weakness from prior CVA; but this is stable.  He is wheelchair bound.  Patient denies fatigue, headache, visual changes, confusion, drenching night sweats, palpable lymph node swelling, mucositis, odynophagia, dysphagia, nausea vomiting, jaundice, chest pain, palpitation, shortness of breath, dyspnea on exertion, productive cough, gum bleeding, epistaxis, hematemesis, hemoptysis, abdominal pain, abdominal swelling, early satiety, melena, hematochezia, hematuria, skin rash, spontaneous bleeding, joint swelling, joint pain, heat or cold intolerance, bowel bladder incontinence, back pain, paresthesia, depression, suicidal or homocidal ideation, feeling hopelessness.   Past Medical History  Diagnosis Date  . 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 Lupus anticoagulant disorder 1990  . DVT of lower extremity (deep venous thrombosis)     RIGHT LOWER; s/p IVC filter 7/12  . Dysphagia   . Hyperlipidemia   . Depression   . Anxiety disorder   . Hypertension   . Anemia, secondary  SECONDARY TO ACUTE BLOOD LOSS  . Seizure disorder   . Morbid obesity   . Protein C deficiency   . Protein S deficiency   . PFO (patent foramen ovale)     TEE 2/10: EF 60%, trivial AI, mild Ao root dilatation, mod PFO with R-L shunting, atrial septal aneurysm;   echo 7/12: EF 65-70%, grade 1 diast dysfxn, mild MR, LVOT showed severe obstruction  . Rectus sheath hematoma 7/12     required reversal of anticoagulation and c/b DVT req. IVC filter  . Bloody stool 08/29/2011    Past Surgical History  Procedure Date  . Vena cavogram 12/2010    INFERIOR  . Dental extraction     multiple    Current Outpatient Prescriptions  Medication Sig Dispense Refill  . acetaminophen (TYLENOL) 325 MG tablet Take 650 mg by mouth every 4 (four) hours as needed.        . baclofen (LIORESAL) 20 MG tablet Take 20 mg by mouth 3 (three) times daily.        . citalopram (CELEXA) 40 MG tablet Take 40 mg by mouth daily.        Marland Kitchen docusate sodium (COLACE) 100 MG capsule Take 100 mg by mouth 2 (two) times daily.        Marland Kitchen enoxaparin (LOVENOX) 100 MG/ML SOLN Inject into the skin every 12 (twelve) hours.        Marland Kitchen ezetimibe (ZETIA) 10 MG tablet Take 10 mg by mouth daily.      . feeding supplement (PRO-STAT SUGAR FREE 64) LIQD Take 30 mLs by mouth 2 (two) times daily.        Marland Kitchen gabapentin (NEURONTIN) 300 MG capsule Take 300 mg by mouth 3 (three) times daily.        . hydrocortisone (ANUSOL-HC) 2.5 % rectal cream Place 1 application rectally 2 (two) times daily.      . metoprolol tartrate (LOPRESSOR) 25 MG tablet Take 25 mg by mouth 2 (two) times daily.       Marland Kitchen omeprazole (PRILOSEC) 20 MG capsule Take 20 mg by mouth daily.        . phenytoin (DILANTIN) 200 MG ER capsule Take 200 mg by mouth 2 (two) times daily.        . polyethylene glycol powder (MIRALAX) powder Take 17 g by mouth daily.        . polysaccharide iron (NIFEREX) 150 MG CAPS capsule Take 150 mg by mouth 2 (two) times daily.        . rosuvastatin (CRESTOR) 40 MG tablet Take 40 mg by mouth daily.        Marland Kitchen senna (SENOKOT) 8.6 MG tablet Take 1 tablet by mouth 2 (two) times daily.        . traZODone (DESYREL) 100 MG tablet Take 100 mg by mouth at bedtime.        Marland Kitchen warfarin (COUMADIN) 5 MG tablet Take 5 mg by mouth daily. 7.5 mg daily as of 08/29/11        ALLERGIES:   has no known allergies.  REVIEW OF SYSTEMS:  The rest of the 14-point  review of system was negative.   Filed Vitals:   08/29/11 1349  BP: 103/68  Pulse: 63  Temp: 97.2 F (36.2 C)   Wt Readings from Last 3 Encounters:  08/29/11 254 lb 9.6 oz (115.486 kg)  06/20/11 267 lb (121.11 kg)  02/18/11 242 lb (109.77 kg)   ECOG Performance status: 2-3 from prior CVA.  PHYSICAL EXAMINATION:   General:  well-nourished in no acute distress.  Eyes:  no scleral icterus.  ENT:  There were no oropharyngeal lesions.  Neck was without thyromegaly.  Lymphatics:  Negative cervical, supraclavicular or axillary adenopathy.  Respiratory: lungs were clear bilaterally without wheezing or crackles.  Cardiovascular:  Regular rate and rhythm, S1/S2, without murmur, rub or gallop.  There was no pedal edema.  GI:  abdomen was obese, soft, nontender, nondistended, without organomegaly.  There was numourous SQ nodules from Lovenox SQ injections.  Muscoloskeletal:  no spinal tenderness of palpation of vertebral spine.  Skin exam was without echymosis, petichae.  Neuro exam was showed flaccid in the right arm and leg.  Patient was able to get on and off exam table without assistance.  Gait was not assessed as he was wheelchair bound.  Patient was alerted and oriented.  Attention was good.   Language was appropriate.  Mood was normal without depression.  Speech was not pressured but he needed time to answer questions.  Thought content was not tangential.         LABORATORY/RADIOLOGY DATA:  Lab Results  Component Value Date   WBC 6.6 08/29/2011   HGB 13.6 08/29/2011   HCT 40.2 08/29/2011   PLT 214 08/29/2011   GLUCOSE 99 08/29/2011   CHOL 172 12/16/2010   TRIG 160* 12/16/2010   HDL 50 12/16/2010   LDLCALC 90 12/16/2010   ALT 26 08/29/2011   AST 15 08/29/2011   NA 139 08/29/2011   K 4.2 08/29/2011   CL 106 08/29/2011   CREATININE 0.73 08/29/2011   BUN 13 08/29/2011   CO2 25 08/29/2011   TSH 1.021 12/16/2010   INR 2.70 08/29/2011   HGBA1C 5.4 12/16/2010    Ct Angio Chest W/cm &/or Wo  Cm  08/21/2011  *RADIOLOGY REPORT*  Clinical Data: Shortness of breath, chest pain  CT ANGIOGRAPHY CHEST  Technique:  Multidetector CT imaging of the chest using the standard protocol during bolus administration of intravenous contrast. Multiplanar reconstructed images including MIPs were obtained and reviewed to evaluate the vascular anatomy.  Contrast: OMNIPAQUE IOHEXOL 350 MG/ML IV SOLN  Comparison: 12/28/2010 radiograph  Findings: Respiratory motion significantly degrades the segmental and subsegmental branches within the lower lobes.  No main or lobar branch pulmonary arterial filling defect is identified.  Normal caliber aorta.  Normal heart size.  No pleural or pericardial effusion.  No intrathoracic lymphadenopathy.  Limited images through the upper abdomen show no acute abnormality.  Respiratory motion degrades detailed parenchymal evaluation. Minimal scarring versus atelectasis in the middle lobe and lingula. focal consolidation.  No pneumothorax.  No acute osseous abnormality.  Mild multilevel degenerative change.  IMPRESSION: No main or lobar branch pulmonary embolism. More peripheral branches are degraded by respiratory motion.  Otherwise, no acute process identified.  Original Report Authenticated By: Waneta Martins, M.D.       ASSESSMENT AND PLAN:  1. 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  He had a supratherapeutic INR in July 2012 and was admitted to the hospital with severe hematoma.  He was reversed with vitamin K, FFP and with withholding his Coumadin, and he developed  left lower extremity deep vein thrombosis in July 2012.  He had been on Lovenox 1mg /kg SQ BID while awaiting decision regarding his PFO intervention.  He has been lost to follow up with me; however, he has been on Lovenox per record at nursing home.  He again developed RLE  DVT on 08/20/2011 while on therapeutic Lovenox dosing.  He does not want to be on injection  anticoagulation anymore.  He is not an appropriate candidate for Kerin Salen since there is no approved antidote at this time.  I again recommended switching back to Coumadin with goal INR 2.5-3.5 with close monitoring.  If he again develops complication, then, I may consider Xarelto at that time.  He had bleeding complication in the past from supratherapeutic INR.  He is aware that this may happen again.  He would like to have INR monitored here at the [facility].  Duration of anticoagulation will be lifelong given recurrent DVT's in the setting of thrombophilia.  I educated patient on stable amount of green leafy vegetables in his diet.  I instructed him to let us know if he takes new medication that can potentially interact with his Coumadin specially antibiotics.  As his INR was 2.7 today, I advised him to continue Coumadin at the current dose of 7.5mg  PO qhs.  [facility] to be seen in about 10 days.   2. Patent foramen ovale:  Per cardiology, no indication for intervention.  We'll continue anticoagulation. 3. 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  He is on lifelong anticoagulation. 4. History of seizure.  He is on Dilantin per PCP. 5. Hyperlipidemia.  He is on zetia per PCP. 6. Hypertension.  Well controlled on metoprolol. 7. Social:  I personally called the patient's sister, Ms. Rob Hickman again and we talked on the phone for about 10 minutes discussing all of the issues of anticoagulation choices, risk and benefit.  She is aware of patient's decision to go back to Coumadin.   8. Follow up with me in about 4 months.  He has [facility] with [facility] in about 10 days.      The length of time of the face-to-face encounter was 30 minutes. More than 50% of time was spent counseling and coordination of care.

## 2011-08-30 NOTE — Progress Notes (Signed)
ADDENDUM:    -  Occasional bloody stool, most likely 2/2 constipation or hemorrhoids:  Pt does not have known family history of colorectal cancer.  He does not have anemia, abdominal pain.  He never had age-appropriate colon cancer screening.   We discussed that if he were to have an abnormal colonoscopy, would he be willing to undergo resection. At this time, he does not think that he would proceed with resection given his comorbidities.  I advised him to think this over.  If he changes his mind, he can let me know so that I can refer him to a GI physician.

## 2011-09-08 ENCOUNTER — Other Ambulatory Visit: Payer: Medicare Other | Admitting: Lab

## 2011-09-08 ENCOUNTER — Ambulatory Visit (HOSPITAL_BASED_OUTPATIENT_CLINIC_OR_DEPARTMENT_OTHER): Payer: Medicare Other | Admitting: Pharmacist

## 2011-09-08 DIAGNOSIS — I639 Cerebral infarction, unspecified: Secondary | ICD-10-CM

## 2011-09-08 DIAGNOSIS — D6859 Other primary thrombophilia: Secondary | ICD-10-CM | POA: Diagnosis not present

## 2011-09-08 DIAGNOSIS — I82409 Acute embolism and thrombosis of unspecified deep veins of unspecified lower extremity: Secondary | ICD-10-CM

## 2011-09-08 LAB — PROTIME-INR: INR: 2.4 (ref 2.00–3.50)

## 2011-09-08 NOTE — Progress Notes (Signed)
INR today subtherapeutic (2.4, goal range = 2.5-3.5).  He does not have any problems with bleeding or bruising.  Pt has been previously managed on therapeutic Lovenox until recently when he had a recurrent DVT.  He was switched to Coumadin on 08/20/11.  He was started on 7.5mg  daily, but pt tells me that he has actually been taking 5mg  tablets.   On further investigation, pt's Coumadin has been managed at [facility] in [place] by a Merchant navy officer, Leretha Dykes (phone # [phone]).  Her MD set his goal INR range at 3-3.5, and she had been taking daily INRs for quite some time.  His INRs had been running slightly above 3.5, so Amanda decreased his dose to 6mg  daily, then just 5mg  daily.  His last INR at the [facility] was 3.0.  Given the decrease in his INR, we decided to increase his dose again to 6mg  daily.  This order was called in to the Rehabilitation Center. Amanda plans to assess his INR by fingerstick at the [facility] in 2 days.  We need to confirm which service the patient wishes to continue managing his anticoagulation.  This situation was explained to Dr. Gaylyn Rong, and he wants [place] to speak with the patient by phone to confirm his wishes.  He understands that the patient did not entirely trust the rehabilitation center to manage his Coumadin since last year, his INR was so supratherapeutic he ended up with a severe abdominal bleed and was hospitalized for an extended period.  After which, he was taken off the Coumadin entirely, and placed on therapeutic Lovenox.  A message was left on the patient's cell phone to call us back and discuss his preferences.

## 2011-09-09 NOTE — Progress Notes (Signed)
Spoke with pt by phone today. I explained the 2 options open to him for monitoring his anticoagulation.  1.  Continuing to come to the [facility] for INR checks and Coumadin Clinic Visits or 2. Allowing Leretha Dykes, Pharm.D., to manage his Coumadin as a Research scientist (medical) at the [facility] rehabilitation center.  Pt seemed unaware that his INRs were being monitored at the Rehab center, and agreed that having Marchelle Folks continue to manage his anticoagulation was probably most convenient for him.  I called Leretha Dykes to confirm the patient's preference, and requested that she fax the patient's INR results, her note, and changes to his Coumadin dose to Dr. Lodema Pilot office (fax # [phone]).  She communicated understanding.  Future INR appointments with the [facility] will be cancelled.  Pt will be archived in Dose Response, and lab informed of the change.

## 2011-09-15 ENCOUNTER — Telehealth: Payer: Self-pay | Admitting: *Deleted

## 2011-09-15 NOTE — Telephone Encounter (Signed)
Received Note from [facility]  States pt's INR on 09/10/11 was 2.5 and Coumadin continued at 10 mg every evening.  Next PT/INR on 09/18/11.

## 2011-10-09 ENCOUNTER — Encounter: Payer: Self-pay | Admitting: *Deleted

## 2011-10-09 NOTE — Progress Notes (Signed)
Received copy of orders from [facility] informing that pt's INR is 2.7 today and pt will continue coumadin 6 mg q pm. Next INR on 10/16/11.  Forwarded to Dr. Gaylyn Rong.

## 2011-10-29 DIAGNOSIS — E7800 Pure hypercholesterolemia, unspecified: Secondary | ICD-10-CM | POA: Diagnosis not present

## 2011-11-10 ENCOUNTER — Encounter: Payer: Self-pay | Admitting: *Deleted

## 2011-11-10 NOTE — Progress Notes (Signed)
Received fax FYI from [facility] SN[facility],  Pt's INR 3.1 and pt to continue on Coumadin 7 mg daily.  Recheck INR on 11/14/11.

## 2011-11-17 ENCOUNTER — Encounter: Payer: Self-pay | Admitting: *Deleted

## 2011-11-17 NOTE — Progress Notes (Signed)
Received Fax from SNF pt's INR 3.9  on 11/14/11.  Coumadin held on 6/07 and resumed 6 mg on 6/08, 6/09.  INR due to recheck today 6/10.

## 2011-12-04 DIAGNOSIS — I6990 Unspecified sequelae of unspecified cerebrovascular disease: Secondary | ICD-10-CM | POA: Diagnosis not present

## 2011-12-24 NOTE — Progress Notes (Signed)
Received PT/INR results on patient [facility]; INR results = 2.7; orders to continue Coumadin 5mg  daily, per Dr. Gaylyn Rong; faxed orders back to facility [phone] / [phone])

## 2011-12-29 ENCOUNTER — Other Ambulatory Visit (HOSPITAL_BASED_OUTPATIENT_CLINIC_OR_DEPARTMENT_OTHER): Payer: Medicare Other

## 2011-12-29 ENCOUNTER — Telehealth: Payer: Self-pay | Admitting: *Deleted

## 2011-12-29 ENCOUNTER — Ambulatory Visit (HOSPITAL_BASED_OUTPATIENT_CLINIC_OR_DEPARTMENT_OTHER): Payer: Medicare Other | Admitting: Oncology

## 2011-12-29 ENCOUNTER — Encounter: Payer: Self-pay | Admitting: Oncology

## 2011-12-29 VITALS — BP 102/67 | HR 68 | Temp 97.0°F

## 2011-12-29 DIAGNOSIS — I82890 Acute embolism and thrombosis of other specified veins: Secondary | ICD-10-CM

## 2011-12-29 DIAGNOSIS — I82409 Acute embolism and thrombosis of unspecified deep veins of unspecified lower extremity: Secondary | ICD-10-CM

## 2011-12-29 DIAGNOSIS — I6350 Cerebral infarction due to unspecified occlusion or stenosis of unspecified cerebral artery: Secondary | ICD-10-CM

## 2011-12-29 DIAGNOSIS — D6859 Other primary thrombophilia: Secondary | ICD-10-CM

## 2011-12-29 DIAGNOSIS — I639 Cerebral infarction, unspecified: Secondary | ICD-10-CM

## 2011-12-29 LAB — COMPREHENSIVE METABOLIC PANEL
ALT: 27 U/L (ref 0–53)
AST: 13 U/L (ref 0–37)
Albumin: 3.3 g/dL — ABNORMAL LOW (ref 3.5–5.2)
BUN: 12 mg/dL (ref 6–23)
CO2: 27 mEq/L (ref 19–32)
Calcium: 9.2 mg/dL (ref 8.4–10.5)
Chloride: 104 mEq/L (ref 96–112)
Potassium: 4.1 mEq/L (ref 3.5–5.3)

## 2011-12-29 LAB — CBC WITH DIFFERENTIAL/PLATELET
BASO%: 0.8 % (ref 0.0–2.0)
Basophils Absolute: 0.1 10*3/uL (ref 0.0–0.1)
EOS%: 2.3 % (ref 0.0–7.0)
HCT: 40.3 % (ref 38.4–49.9)
HGB: 13.8 g/dL (ref 13.0–17.1)
MCH: 30.7 pg (ref 27.2–33.4)
MONO#: 0.9 10*3/uL (ref 0.1–0.9)
NEUT%: 74.4 % (ref 39.0–75.0)
RDW: 15.1 % — ABNORMAL HIGH (ref 11.0–14.6)
WBC: 10.1 10*3/uL (ref 4.0–10.3)
lymph#: 1.4 10*3/uL (ref 0.9–3.3)

## 2011-12-29 LAB — PROTIME-INR: Protime: 32.4 Seconds — ABNORMAL HIGH (ref 10.6–13.4)

## 2011-12-29 NOTE — Progress Notes (Signed)
[facility] OFFICE PROGRESS NOTE  Cc:  Karlene Einstein, MD  DIAGNOSIS:    1. 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  2. Patent foramen ovale. 3. History of recurrent DVT's and CVA.  PAST THERAPY:  Coumadin in 2010 when presented with CVA, bilateral PE.  His anticoagulant was switched to Lovenox in July 2012 when he had supratherapeutic INR and large abdominal hematoma.  He was thus placed on Lovenox 130 mg SQ B.i.d while awaiting Dr. Earmon Phoenix decision whether to intervene on his PFO with planned to switch back to Coumadin with close monitoring.  He had another right lower extremity DVT 08/2011.  He was switched back to Coumadin since 08/21/2011.   CURRENT THERAPY:  Coumadin 6.5mg  PO qhs.   INTERVAL HISTORY: James Villegas 56 y.o. male returns for regular follow up by himself.  He was last seen in clinic in 08/2011.  The patient continues to reside at a SNF. States he is supposed to be walking with the Restorative program, but they don't walk him that frequently.  He remains on Coumadin. It appears as though he is taking 6.5 mg of Coumadin daily. Coumadin is managed by pharmacist at the facility. Denies chest pian, shortness of breath, dyspnea. Denies hemoptysis, hematuria, and melena.   Patient denies fatigue, headache, visual changes, confusion, drenching night sweats, palpable lymph node swelling, mucositis, odynophagia, dysphagia, nausea vomiting, jaundice, chest pain, palpitation, shortness of breath, dyspnea on exertion, productive cough, gum bleeding, epistaxis, hematemesis, hemoptysis, abdominal pain, abdominal swelling, early satiety, melena, hematochezia, hematuria, skin rash, spontaneous bleeding, joint swelling, joint pain, heat or cold intolerance, bowel bladder incontinence, back pain, paresthesia, depression, suicidal or homocidal ideation, feeling hopelessness.   Past Medical History  Diagnosis Date  . 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 Lupus anticoagulant disorder 1990  . DVT of lower extremity (deep venous thrombosis)     RIGHT LOWER; s/p IVC filter 7/12  . Dysphagia   . Hyperlipidemia   . Depression   . Anxiety disorder   . Hypertension   . Anemia, secondary     SECONDARY TO ACUTE BLOOD LOSS  . Seizure disorder   . Morbid obesity   . Protein C deficiency   . Protein S deficiency   . PFO (patent foramen ovale)     TEE 2/10: EF 60%, trivial AI, mild Ao root dilatation, mod PFO with R-L shunting, atrial septal aneurysm;   echo 7/12: EF 65-70%, grade 1 diast dysfxn, mild MR, LVOT showed severe obstruction  . Rectus sheath hematoma 7/12    required reversal of anticoagulation and c/b DVT req. IVC filter  . Bloody stool 08/29/2011    intermittent along with constipation.     Past Surgical History  Procedure Date  . Vena cavogram 12/2010    INFERIOR  . Dental extraction     multiple    Current Outpatient Prescriptions  Medication Sig Dispense Refill  . acetaminophen (TYLENOL) 325 MG tablet Take 650 mg by mouth every 4 (four) hours as needed.        . baclofen (LIORESAL) 20 MG tablet Take 20 mg by mouth 3 (three) times daily.        . citalopram (CELEXA) 40 MG tablet Take 20 mg by mouth daily.       Marland Kitchen docusate sodium (COLACE) 100 MG capsule Take 100 mg by mouth 2 (two) times daily.        Marland Kitchen  ezetimibe (ZETIA) 10 MG tablet Take 10 mg by mouth daily.      . fish oil-omega-3 fatty acids 1000 MG capsule Take 1 g by mouth 2 (two) times daily before a meal.      . gabapentin (NEURONTIN) 300 MG capsule Take 300 mg by mouth 3 (three) times daily.        . hydrocortisone (ANUSOL-HC) 2.5 % rectal cream Place 1 application rectally 2 (two) times daily as needed.       . metoprolol tartrate (LOPRESSOR) 25 MG tablet Take 25 mg by mouth 2 (two) times daily.       Marland Kitchen omeprazole (PRILOSEC) 20 MG capsule Take 20 mg by mouth daily.        .  phenytoin (DILANTIN) 200 MG ER capsule Take 200 mg by mouth 2 (two) times daily.        . polyethylene glycol powder (MIRALAX) powder Take 17 g by mouth daily as needed.       . rosuvastatin (CRESTOR) 40 MG tablet Take 40 mg by mouth daily.        Marland Kitchen senna (SENOKOT) 8.6 MG tablet Take 1 tablet by mouth 2 (two) times daily as needed.       . traZODone (DESYREL) 100 MG tablet Take 100 mg by mouth at bedtime as needed.       . warfarin (COUMADIN) 5 MG tablet Take 5 mg by mouth daily. 7.5 mg daily as of 08/29/11        ALLERGIES:   has no known allergies.  REVIEW OF SYSTEMS:  The rest of the 14-point review of system was negative.   Filed Vitals:   12/29/11 1524  BP: 102/67  Pulse: 68  Temp: 97 F (36.1 C)   Wt Readings from Last 3 Encounters:  08/29/11 254 lb 9.6 oz (115.486 kg)  06/20/11 267 lb (121.11 kg)  02/18/11 242 lb (109.77 kg)   ECOG Performance status: 2-3 from prior CVA.   PHYSICAL EXAMINATION:   General:  well-nourished in no acute distress.  Eyes:  no scleral icterus.  ENT:  There were no oropharyngeal lesions.  Neck was without thyromegaly.  Lymphatics:  Negative cervical, supraclavicular or axillary adenopathy.  Respiratory: lungs were clear bilaterally without wheezing or crackles.  Cardiovascular:  Regular rate and rhythm, S1/S2, without murmur, rub or gallop.  There was no pedal edema.  GI:  abdomen was obese, soft, nontender, nondistended, without organomegaly.  There was numourous SQ nodules from Lovenox SQ injections.  Muscoloskeletal:  no spinal tenderness of palpation of vertebral spine.  Skin exam was without echymosis, petichae.  Neuro exam was showed flaccid in the right arm and leg. Gait was not assessed as he was wheelchair bound.  Patient was alerted and oriented.  Attention was good.   Language was appropriate.  Mood was normal without depression.  Speech was not pressured but he needed time to answer questions.  Thought content was not tangential.     LABORATORY/RADIOLOGY DATA:  Lab Results  Component Value Date   WBC 10.1 12/29/2011   HGB 13.8 12/29/2011   HCT 40.3 12/29/2011   PLT 183 12/29/2011   GLUCOSE 99 08/29/2011   CHOL 172 12/16/2010   TRIG 160* 12/16/2010   HDL 50 12/16/2010   LDLCALC 90 12/16/2010   ALT 26 08/29/2011   AST 15 08/29/2011   NA 139 08/29/2011   K 4.2 08/29/2011   CL 106 08/29/2011   CREATININE 0.73 08/29/2011   BUN 13 08/29/2011  CO2 25 08/29/2011   TSH 1.021 12/16/2010   INR 2.70 12/29/2011   HGBA1C 5.4 12/16/2010    ASSESSMENT AND PLAN:  1. 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  He is on Coumadin with goal INR 2.5-3.5 with close monitoring.  If he again develops complication, then, I may consider Xarelto at that time.  He had bleeding complication in the past from supratherapeutic INR.  He is aware that this may happen again. INR is being managed by pharmacist at the SNF.  Duration of anticoagulation will be lifelong given recurrent DVT's in the setting of thrombophilia.  I educated patient on stable amount of green leafy vegetables in his diet.  I instructed him to let us know if he takes new medication that can potentially interact with his Coumadin specially antibiotics.   2. Patent foramen ovale:  Per cardiology, no indication for intervention.  We'll continue anticoagulation. 3. 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  He is on lifelong anticoagulation. 4. History of seizure.  He is on Dilantin per PCP. 5. Hyperlipidemia.  He is on zetia per PCP. 6. Hypertension.  Well controlled on metoprolol. 7. Follow up in about 4 months.       The length of time of the face-to-face encounter was 30 minutes. More than 50% of time was spent counseling and coordination of care.

## 2011-12-29 NOTE — Telephone Encounter (Signed)
Printed out calendar and gave to the patient 04-30-2012 starting at 2:30pm

## 2011-12-31 ENCOUNTER — Encounter: Payer: Self-pay | Admitting: *Deleted

## 2011-12-31 NOTE — Progress Notes (Signed)
Patient's INR from 12/30/2011 was 3.0.  Orders from [facility] and [facility] are to continue Coumadin 5mg  PO Qdaily at 6pm.  Next INR to be drawn 01/15/2012.  Dr. Gaylyn Rong aware.

## 2012-01-13 DIAGNOSIS — I6990 Unspecified sequelae of unspecified cerebrovascular disease: Secondary | ICD-10-CM | POA: Diagnosis not present

## 2012-01-13 DIAGNOSIS — R262 Difficulty in walking, not elsewhere classified: Secondary | ICD-10-CM | POA: Diagnosis not present

## 2012-04-26 ENCOUNTER — Other Ambulatory Visit (HOSPITAL_BASED_OUTPATIENT_CLINIC_OR_DEPARTMENT_OTHER): Payer: Medicare Other

## 2012-04-26 ENCOUNTER — Telehealth: Payer: Self-pay | Admitting: Oncology

## 2012-04-26 ENCOUNTER — Ambulatory Visit (HOSPITAL_BASED_OUTPATIENT_CLINIC_OR_DEPARTMENT_OTHER): Payer: Medicare Other | Admitting: Oncology

## 2012-04-26 VITALS — BP 108/70 | HR 65 | Temp 97.8°F | Resp 22

## 2012-04-26 DIAGNOSIS — I82409 Acute embolism and thrombosis of unspecified deep veins of unspecified lower extremity: Secondary | ICD-10-CM | POA: Diagnosis not present

## 2012-04-26 DIAGNOSIS — Z593 Problems related to living in residential institution: Secondary | ICD-10-CM

## 2012-04-26 DIAGNOSIS — I6350 Cerebral infarction due to unspecified occlusion or stenosis of unspecified cerebral artery: Secondary | ICD-10-CM | POA: Diagnosis not present

## 2012-04-26 DIAGNOSIS — I639 Cerebral infarction, unspecified: Secondary | ICD-10-CM

## 2012-04-26 DIAGNOSIS — Z23 Encounter for immunization: Secondary | ICD-10-CM

## 2012-04-26 DIAGNOSIS — D6859 Other primary thrombophilia: Secondary | ICD-10-CM

## 2012-04-26 DIAGNOSIS — Z7901 Long term (current) use of anticoagulants: Secondary | ICD-10-CM

## 2012-04-26 LAB — COMPREHENSIVE METABOLIC PANEL (CC13)
BUN: 17 mg/dL (ref 7.0–26.0)
CO2: 26 mEq/L (ref 22–29)
Creatinine: 0.8 mg/dL (ref 0.7–1.3)
Glucose: 99 mg/dl (ref 70–99)
Sodium: 140 mEq/L (ref 136–145)
Total Bilirubin: 0.2 mg/dL (ref 0.20–1.20)
Total Protein: 6.4 g/dL (ref 6.4–8.3)

## 2012-04-26 LAB — CBC WITH DIFFERENTIAL/PLATELET
BASO%: 0.9 % (ref 0.0–2.0)
EOS%: 5.2 % (ref 0.0–7.0)
HCT: 39.5 % (ref 38.4–49.9)
LYMPH%: 24.5 % (ref 14.0–49.0)
MCH: 30.9 pg (ref 27.2–33.4)
MCHC: 35 g/dL (ref 32.0–36.0)
MONO%: 7.3 % (ref 0.0–14.0)
NEUT%: 62.1 % (ref 39.0–75.0)
Platelets: 213 10*3/uL (ref 140–400)
RBC: 4.48 10*6/uL (ref 4.20–5.82)

## 2012-04-26 MED ORDER — INFLUENZA VIRUS VACC SPLIT PF IM SUSP
0.5000 mL | Freq: Once | INTRAMUSCULAR | Status: DC
  Filled 2012-04-26: qty 0.5

## 2012-04-26 NOTE — Patient Instructions (Addendum)
DIAGNOSIS:  1. 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  2. Patent foramen ovale. 3. History of recurrent DVT's and CVA.  CURRENT THERAPY: Coumadin 5mg  alternating with 6mg  every night.  INR today is 3.6.  It is only slightly elevated. No change in dose of Coumadin for now. Please recheck INR in about one week (around 05/03/2012).   FOLLOW UP:  - In about 8 months at the [facility].   - INR check with nursing home pharmacist at least every 2 weeks for now.  In the future, when it is stable, we can check it once a month.   CONSTIPATION with intermittent hematochezia: - Most likely due to immobility and diet.  He denied any known family history of colon cancer. - Normally, patients older than age 42's require screening colonoscopy.  In his case, that will be high risk given his history of recurrent CVA, DVT.   - He would like to think this over and discuss with his PCP.  If he wants a colonoscopy, he would need to have tight bridging with either inpatient Lovenox or inpatient Heparin IV protocol.

## 2012-04-26 NOTE — Telephone Encounter (Signed)
appts made and printed for pt aom °

## 2012-04-26 NOTE — Progress Notes (Signed)
[facility]  [phone] [phone]   OFFICE PROGRESS NOTE   Cc:  DASANAYAKA,GAYANI, MD   DIAGNOSIS:  1. 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  2. Patent foramen ovale. 3. History of recurrent DVT's and CVA.  PAST THERAPY: Coumadin in 2010 when presented with CVA, bilateral PE. His anticoagulant was switched to Lovenox in July 2012 when he had supratherapeutic INR and large abdominal hematoma. He was thus placed on Lovenox 130 mg SQ B.i.d while awaiting Dr. Earmon Phoenix decision whether to intervene on his PFO with planned to switch back to Coumadin with close monitoring. He had another right lower extremity DVT 08/2011. He was switched back to Coumadin since 08/21/2011.   CURRENT THERAPY: Coumadin alternating between 5mg  and 6mg  PO qhs.   INTERVAL HISTORY: James Villegas 56 y.o. male returns for regular follow up with a nurse aid from the nursing home.  He reported no leg pain, chest pain, abdominal pain.  He denied bleeding from Coumadin.  He however does have constipation.  He takes stool softener on a routine basis.  When he strains, he notices slight fresh blood on toilet paper.   He still has right sided weakness from past stroke.  He is still able to move his left arm and leg.  However, due to limitation at the nursing home facility, he does not get ambulation every day.  He is mainly wheelchair bound.  He noticed bilateral pedal edema without PND, orthopnea, SOB.   Patient denies fever, anorexia, weight loss, fatigue, headache, visual changes, confusion, drenching night sweats, palpable lymph node swelling, mucositis, odynophagia, dysphagia, nausea vomiting, jaundice, chest pain, palpitation, shortness of breath, dyspnea on exertion, productive cough, gum bleeding, epistaxis, hematemesis, hemoptysis, abdominal pain, abdominal swelling, early satiety, melena, hematuria, skin rash, spontaneous  bleeding, joint swelling, joint pain, heat or cold intolerance, bowel bladder incontinence, back pain, paresthesia, depression.   Past Medical History  Diagnosis Date  . 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 Lupus anticoagulant disorder 1990  . DVT of lower extremity (deep venous thrombosis)     RIGHT LOWER; s/p IVC filter 7/12  . Dysphagia   . Hyperlipidemia   . Depression   . Anxiety disorder   . Hypertension   . Anemia, secondary     SECONDARY TO ACUTE BLOOD LOSS  . Seizure disorder   . Morbid obesity   . Protein C deficiency   . Protein S deficiency   . PFO (patent foramen ovale)     TEE 2/10: EF 60%, trivial AI, mild Ao root dilatation, mod PFO with R-L shunting, atrial septal aneurysm;   echo 7/12: EF 65-70%, grade 1 diast dysfxn, mild MR, LVOT showed severe obstruction  . Rectus sheath hematoma 7/12    required reversal of anticoagulation and c/b DVT req. IVC filter  . Bloody stool 08/29/2011    intermittent along with constipation.     Past Surgical History  Procedure Date  . Vena cavogram 12/2010    INFERIOR  . Dental extraction     multiple    Current Outpatient Prescriptions  Medication Sig Dispense Refill  . acetaminophen (TYLENOL) 325 MG tablet Take 650 mg by mouth every 4 (four) hours as needed.        . baclofen (LIORESAL) 20 MG tablet Take 20 mg by mouth 3 (three) times daily.        . citalopram (CELEXA)  40 MG tablet Take 20 mg by mouth daily.       Marland Kitchen docusate sodium (COLACE) 100 MG capsule Take 100 mg by mouth 2 (two) times daily.        Marland Kitchen ezetimibe (ZETIA) 10 MG tablet Take 10 mg by mouth daily.      . fish oil-omega-3 fatty acids 1000 MG capsule Take 1 g by mouth 2 (two) times daily before a meal.      . gabapentin (NEURONTIN) 300 MG capsule Take 300 mg by mouth 3 (three) times daily.        . hydrocortisone (ANUSOL-HC) 2.5 % rectal cream Place 1 application rectally 2 (two) times daily as needed.         . metoprolol tartrate (LOPRESSOR) 25 MG tablet Take 25 mg by mouth 2 (two) times daily.       . phenytoin (DILANTIN) 200 MG ER capsule Take 200 mg by mouth 2 (two) times daily.        . polyethylene glycol powder (MIRALAX) powder Take 17 g by mouth daily as needed.       . rosuvastatin (CRESTOR) 40 MG tablet Take 40 mg by mouth daily.        Marland Kitchen senna (SENOKOT) 8.6 MG tablet Take 1 tablet by mouth 2 (two) times daily as needed.       . traZODone (DESYREL) 100 MG tablet Take 100 mg by mouth at bedtime as needed.       . warfarin (COUMADIN) 5 MG tablet Take 5 mg by mouth daily. 7.5 mg daily as of 08/29/11      . omeprazole (PRILOSEC) 20 MG capsule Take 20 mg by mouth daily.         Current Facility-Administered Medications  Medication Dose Route Frequency Provider Last Rate Last Dose  . influenza  inactive virus vaccine (FLUZONE/FLUARIX) injection 0.5 mL  0.5 mL Intramuscular Once Exie Parody, MD        ALLERGIES:   has no known allergies.  REVIEW OF SYSTEMS:  The rest of the 14-point review of system was negative.   Filed Vitals:   04/26/12 1522  BP: 108/70  Pulse: 65  Temp: 97.8 F (36.6 C)  Resp: 22   Wt Readings from Last 3 Encounters:  08/29/11 254 lb 9.6 oz (115.486 kg)  06/20/11 267 lb (121.11 kg)  02/18/11 242 lb (109.77 kg)    ECOG Performance status: 2-3 from prior CVA.   PHYSICAL EXAMINATION:   General: well-nourished in no acute distress. Eyes: no scleral icterus. ENT: There were no oropharyngeal lesions. Neck was without thyromegaly. Lymphatics: Negative cervical, supraclavicular or axillary adenopathy. Respiratory: lungs were clear bilaterally without wheezing or crackles. Cardiovascular: Regular rate and rhythm, S1/S2, without murmur, rub or gallop. There was 1+ bilateral, nonpitting pedal edema. GI: abdomen was obese, soft, nontender, nondistended, without organomegaly.  Muscoloskeletal: no spinal tenderness of palpation of vertebral spine. Skin exam was without  echymosis, petichae. Neuro exam was showed flaccid in the right arm and leg. Patient was able to get on and off exam table without assistance. Gait was not assessed as he was wheelchair bound. Patient was alerted and oriented. Attention was good. Language was appropriate. Mood was normal without depression. Speech was not pressured but he needed time to answer questions. Thought content was not tangential.   LABORATORY/RADIOLOGY DATA:  Lab Results  Component Value Date   WBC 7.3 04/26/2012   HGB 13.8 04/26/2012   HCT 39.5 04/26/2012   PLT  213 04/26/2012   GLUCOSE 99 04/26/2012   CHOL 172 12/16/2010   TRIG 160* 12/16/2010   HDL 50 12/16/2010   LDLCALC 90 12/16/2010   ALKPHOS 130 04/26/2012   ALT 27 04/26/2012   AST 20 04/26/2012   NA 140 04/26/2012   K 4.4 04/26/2012   CL 109* 04/26/2012   CREATININE 0.8 04/26/2012   BUN 17.0 04/26/2012   CO2 26 04/26/2012   INR 3.60* 04/26/2012   HGBA1C 5.4 12/16/2010    ASSESSMENT AND PLAN:   1.  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 Lifelong anticoag.  His INR is 3.6 today.  I recommended no change in his current dose of Coumadin.  I recommended rechecking in 1 week.  His previous INR had been quite stable.   2.  Patent foramen ovale: Per cardiology, no indication for intervention due to high risk procedure. We'll continue anticoagulation.  3.  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 He is on lifelong anticoagulation.   4.  History of seizure. He is on Dilantin per PCP.  5.  Hyperlipidemia. He is on zetia and fish oil per PCP.  6.  Hypertension. Well controlled on metoprolol.  7.  Constipation with intermittent hematochezia: - Most likely due to immobility and diet.  He denied any known family history of colon cancer. - Normally, patients older than age 53's require screening colonoscopy.  In his case, that will be high risk given his history of recurrent CVA,  DVT.   - He would like to think this over and discuss with his PCP.  If he wants a colonoscopy, he would need to have tight bridging with either inpatient Lovenox or inpatient Heparin IV protocol.   8.  Follow up:  NR check with nursing home pharmacist at least every 2 weeks for now starting on 05/03/2012.  In the future, when it is stable, we can check it once a month.      The length of time of the face-to-face encounter was 15 minutes. More than 50% of time was spent counseling and coordination of care.

## 2012-08-30 ENCOUNTER — Non-Acute Institutional Stay (SKILLED_NURSING_FACILITY): Payer: Medicare Other | Admitting: Internal Medicine

## 2012-08-30 DIAGNOSIS — L02419 Cutaneous abscess of limb, unspecified: Secondary | ICD-10-CM

## 2012-08-30 DIAGNOSIS — Z7901 Long term (current) use of anticoagulants: Secondary | ICD-10-CM

## 2012-08-30 DIAGNOSIS — L03119 Cellulitis of unspecified part of limb: Secondary | ICD-10-CM

## 2012-09-02 NOTE — Progress Notes (Signed)
PROGRESS NOTE  DATE: 08/30/12   FACILITY: [facility]  LEVEL OF CARE: SNF  Acute Visit  CHIEF COMPLAINT:  Manage left leg infection  HISTORY OF PRESENT ILLNESS: I was requested by the staff to assess the patient regarding above problem: Treatment nurse reports that patient has a quarter size red raised area to left leg just behind knee. The area ice warm. The patient states that this has been present for a couple of days and the area is painful. he cannot identify precipitating or alleviating factors, there is no temporal relationship and there are on no other associated signs and symptoms.  PAST MEDICAL HISTORY : Reviewed.  No changes.  CURRENT MEDICATIONS: Reviewed per [facility]  REVIEW OF SYSTEMS:  GENERAL: no change in appetite, no fatigue, no weight changes, no fever, chills or weakness RESPIRATORY: no cough, SOB, DOE,, wheezing, hemoptysis CARDIAC: no chest pain, edema or palpitations  PHYSICAL EXAMINATION  GENERAL: no acute distress, normal body habitus RESPIRATORY: breathing is even & unlabored, BS CTAB CARDIAC: RRR, no murmur,no extra heart sounds, no edema GI: abdomen soft, normal BS, no masses, no tenderness, no hepatomegaly, no splenomegaly Skin: On the left medial lower extremity just below the knee there is a quarter centimeter size erythematous area which is tender to palpation warm and edematous.  ASSESSMENT/PLAN:  1.Cellulitis of left lower extremity-new problem. Start cephalexin 500 mg 4 times a day for 10 days and probiotics twice a day for 14 days. 2. Anticoagulation-check INR in 3 days since I am initiating cephalexin.  CPT CODE: 16109

## 2012-09-13 ENCOUNTER — Non-Acute Institutional Stay (SKILLED_NURSING_FACILITY): Payer: Medicare Other | Admitting: Internal Medicine

## 2012-09-13 DIAGNOSIS — E7800 Pure hypercholesterolemia, unspecified: Secondary | ICD-10-CM

## 2012-09-13 DIAGNOSIS — G609 Hereditary and idiopathic neuropathy, unspecified: Secondary | ICD-10-CM

## 2012-09-13 DIAGNOSIS — I6990 Unspecified sequelae of unspecified cerebrovascular disease: Secondary | ICD-10-CM

## 2012-09-13 DIAGNOSIS — I10 Essential (primary) hypertension: Secondary | ICD-10-CM

## 2012-09-15 NOTE — Progress Notes (Signed)
PROGRESS NOTE  DATE: 09-13-12  FACILITY: [facility]  LEVEL OF CARE: SNF  Routine Visit  CHIEF COMPLAINT:  Manage CVA and hyperlipidemia  HISTORY OF PRESENT ILLNESS:  REASSESSMENT OF ONGOING PROBLEM(S): 1.CVA: The patient's CVA remains stable.  Patient denies new neurologic symptoms such as numbness, tingling, weakness, speech difficulties or visual disturbances.  No complications reported from the medications currently being used. He has right-sided hemiparesis. 2.HYPERLIPIDEMIA: No complications from the medications presently being used. Last fasting lipid panel showed : LDL 120 otherwise Fasting lipid panel normal in 12/13.  PAST MEDICAL HISTORY : Reviewed.  No changes.  CURRENT MEDICATIONS: Reviewed per [facility]  REVIEW OF SYSTEMS:  GENERAL: no change in appetite, no fatigue, no weight changes, no fever, chills or weakness RESPIRATORY: no cough, SOB, DOE, wheezing, hemoptysis CARDIAC: no chest pain, or palpitations, complains of lower extremity swelling GI: no abdominal pain, diarrhea, constipation, heart burn, nausea or vomiting  PHYSICAL EXAMINATION  VS:  T 97.4      P 64      RR 18      BP 104/56     POX %     WT (Lb) to 42.5  GENERAL: no acute distress, morbidly obese body habitus NECK: supple, trachea midline, no neck masses, no thyroid tenderness, no thyromegaly RESPIRATORY: breathing is even & unlabored, BS CTAB CARDIAC: RRR, no murmur,no extra heart sounds, +2 bilateral lower extremity edema GI: abdomen soft, normal BS, no masses, no tenderness, no hepatomegaly, no splenomegaly PSYCHIATRIC: the patient is alert & oriented to person, affect & behavior appropriate  LABS/RADIOLOGY: 12/13 CBC normal, total protein 5.4 otherwise CMP normal, Dilantin 9 11/13 TSH 2.6   ASSESSMENT/PLAN: 1. CVA-continue supportive care. 2. hyperlipidemia -- on maximum dose of Crestor. 3. hypertension-well-controlled. 4. peripheral neuropathy-stable. 5. DVT-continue Coumadin. 6.  seizure disorder-well controlled.  CPT CODE: 16109

## 2012-10-11 ENCOUNTER — Non-Acute Institutional Stay (SKILLED_NURSING_FACILITY): Payer: Medicare Other | Admitting: Internal Medicine

## 2012-10-11 DIAGNOSIS — G609 Hereditary and idiopathic neuropathy, unspecified: Secondary | ICD-10-CM

## 2012-10-11 DIAGNOSIS — I10 Essential (primary) hypertension: Secondary | ICD-10-CM

## 2012-10-11 DIAGNOSIS — I6990 Unspecified sequelae of unspecified cerebrovascular disease: Secondary | ICD-10-CM

## 2012-10-11 DIAGNOSIS — E7800 Pure hypercholesterolemia, unspecified: Secondary | ICD-10-CM

## 2012-10-13 ENCOUNTER — Non-Acute Institutional Stay (SKILLED_NURSING_FACILITY): Payer: Medicare Other | Admitting: Internal Medicine

## 2012-10-13 DIAGNOSIS — L03119 Cellulitis of unspecified part of limb: Secondary | ICD-10-CM

## 2012-10-13 DIAGNOSIS — L02419 Cutaneous abscess of limb, unspecified: Secondary | ICD-10-CM

## 2012-10-15 DIAGNOSIS — G609 Hereditary and idiopathic neuropathy, unspecified: Secondary | ICD-10-CM | POA: Insufficient documentation

## 2012-10-15 DIAGNOSIS — I10 Essential (primary) hypertension: Secondary | ICD-10-CM | POA: Insufficient documentation

## 2012-10-15 DIAGNOSIS — I6990 Unspecified sequelae of unspecified cerebrovascular disease: Secondary | ICD-10-CM | POA: Insufficient documentation

## 2012-10-15 DIAGNOSIS — E7800 Pure hypercholesterolemia, unspecified: Secondary | ICD-10-CM | POA: Insufficient documentation

## 2012-10-15 NOTE — Progress Notes (Signed)
PROGRESS NOTE  DATE: 10-11-12  FACILITY: [facility]  LEVEL OF CARE: SNF  Routine Visit  CHIEF COMPLAINT:  Manage CVA and hyperlipidemia  HISTORY OF PRESENT ILLNESS:  REASSESSMENT OF ONGOING PROBLEM(S):  1.CVA: The patient's CVA remains stable.  Patient denies new neurologic symptoms such as numbness, tingling, weakness, speech difficulties or visual disturbances.  No complications reported from the medications currently being used. He has right-sided hemiparesis.  2.HYPERLIPIDEMIA: No complications from the medications presently being used. Last fasting lipid panel showed : LDL 120 otherwise Fasting lipid panel normal in 12/13.  PAST MEDICAL HISTORY : Reviewed.  No changes.  CURRENT MEDICATIONS: Reviewed per [facility]  REVIEW OF SYSTEMS:  GENERAL: no change in appetite, no fatigue, no weight changes, no fever, chills or weakness RESPIRATORY: no cough, SOB, DOE, wheezing, hemoptysis CARDIAC: no chest pain, or palpitations, complains of lower extremity swelling GI: no abdominal pain, diarrhea, constipation, heart burn, nausea or vomiting  PHYSICAL EXAMINATION  VS:  T 99.6      P 75      RR 20      BP 110/70     POX %     WT (Lb) to 245  GENERAL: no acute distress, morbidly obese body habitus NECK: supple, trachea midline, no neck masses, no thyroid tenderness, no thyromegaly RESPIRATORY: breathing is even & unlabored, BS CTAB CARDIAC: RRR, no murmur,no extra heart sounds, +2 bilateral lower extremity edema GI: abdomen soft, normal BS, no masses, no tenderness, no hepatomegaly, no splenomegaly PSYCHIATRIC: the patient is alert & oriented to person, affect & behavior appropriate  LABS/RADIOLOGY: 12/13 CBC normal, total protein 5.4 otherwise CMP normal, Dilantin 9 11/13 TSH 2.6   ASSESSMENT/PLAN: 1. CVA-continue supportive care. 2. hyperlipidemia -- on maximum dose of Crestor. 3. hypertension-well-controlled. 4. peripheral neuropathy-stable. 5. DVT-continue Coumadin. 6.  seizure disorder-well controlled.  CPT CODE: 16109

## 2012-11-03 NOTE — Progress Notes (Signed)
Patient ID: James Villegas, male   DOB: 01-03-1956, 57 y.o.   MRN: [id number]        PROGRESS NOTE  DATE: 10/13/2012  FACILITY:  [facility]  LEVEL OF CARE: SNF (31)  Acute Visit  CHIEF COMPLAINT:  Manage right lower extremity redness and warmth.    HISTORY OF PRESENT ILLNESS: I was requested by the staff to assess the patient regarding above problem(s):  Staff report that patient is having right lower extremity redness and warmth, noted today.  Patient is complaining of pain and itching.  Patient cannot identify precipitating or alleviating factors.  There is no temporal relationship.  Per patient, extremities are swollen.     PAST MEDICAL HISTORY : Reviewed.  No changes.  CURRENT MEDICATIONS: Reviewed per [facility]  REVIEW OF SYSTEMS:  GENERAL: no change in appetite, no fatigue, no weight changes, no fever, chills or weakness RESPIRATORY: no cough, SOB, DOE,, wheezing, hemoptysis CARDIAC: no chest pain or palpitations; complains of bilateral lower extremity swelling GI: no abdominal pain, diarrhea, constipation, heart burn, nausea or vomiting  PHYSICAL EXAMINATION  VS:  T 98.8       P 78      RR 20     BP 110/70    POX %       WT (Lb)  GENERAL: no acute distress, morbidly obese body habitus SKIN:  INSPECTION:  right lower extremity is erythematous, warm, tender to palpation, and edematous NECK: supple, trachea midline, no neck masses, no thyroid tenderness, no thyromegaly RESPIRATORY: breathing is even & unlabored, BS CTAB CARDIAC: RRR, no murmur,no extra heart sounds EDEMA/VARICOSITIES: right lower extremity has +3 edema, left lower extremity has +2 edema ARTERIAL:  pedal pulses nonpalpable  GI: abdomen soft, normal BS, no masses, no tenderness, no hepatomegaly, no splenomegaly PSYCHIATRIC: the patient is alert & oriented to person, affect & behavior appropriate  ASSESSMENT/PLAN:  Right lower extremity cellulitis.  New problem.  Start cephalexin 500 mg  q.i.d. for 10 days and probiotics b.i.d. for 14 days.    CPT CODE: 46962

## 2012-11-08 ENCOUNTER — Non-Acute Institutional Stay (SKILLED_NURSING_FACILITY): Payer: Medicare Other | Admitting: Internal Medicine

## 2012-11-08 DIAGNOSIS — I10 Essential (primary) hypertension: Secondary | ICD-10-CM

## 2012-11-08 DIAGNOSIS — I639 Cerebral infarction, unspecified: Secondary | ICD-10-CM

## 2012-11-08 DIAGNOSIS — G609 Hereditary and idiopathic neuropathy, unspecified: Secondary | ICD-10-CM

## 2012-11-08 DIAGNOSIS — I6350 Cerebral infarction due to unspecified occlusion or stenosis of unspecified cerebral artery: Secondary | ICD-10-CM

## 2012-11-08 DIAGNOSIS — E7800 Pure hypercholesterolemia, unspecified: Secondary | ICD-10-CM

## 2012-11-08 NOTE — Progress Notes (Signed)
PROGRESS NOTE  DATE: 11-08-12  FACILITY: [facility]  LEVEL OF CARE: SNF  Routine Visit  CHIEF COMPLAINT:  Manage CVA and hyperlipidemia  HISTORY OF PRESENT ILLNESS:  REASSESSMENT OF ONGOING PROBLEM(S):  1.CVA: The patient's CVA remains stable.  Patient denies new neurologic symptoms such as numbness, tingling, weakness, speech difficulties or visual disturbances.  No complications reported from the medications currently being used. He has right-sided hemiparesis.  2.HYPERLIPIDEMIA: No complications from the medications presently being used. Last fasting lipid panel showed : LDL 120 otherwise Fasting lipid panel normal in 12/13.  PAST MEDICAL HISTORY : Reviewed.  No changes.  CURRENT MEDICATIONS: Reviewed per [facility]  REVIEW OF SYSTEMS:  GENERAL: no change in appetite, no fatigue, no weight changes, no fever, chills or weakness RESPIRATORY: no cough, SOB, DOE, wheezing, hemoptysis CARDIAC: no chest pain, or palpitations, complains of lower extremity swelling GI: no abdominal pain, diarrhea, constipation, heart burn, nausea or vomiting  PHYSICAL EXAMINATION  VS:  T 97.6      P 63      RR 18      BP 123/74     POX %     WT (Lb) to 250  GENERAL: no acute distress, morbidly obese body habitus NECK: supple, trachea midline, no neck masses, no thyroid tenderness, no thyromegaly RESPIRATORY: breathing is even & unlabored, BS CTAB CARDIAC: RRR, no murmur,no extra heart sounds, +2 bilateral lower extremity edema GI: abdomen soft, normal BS, no masses, no tenderness, no hepatomegaly, no splenomegaly PSYCHIATRIC: the patient is alert & oriented to person, affect & behavior appropriate  LABS/RADIOLOGY: 12/13 CBC normal, total protein 5.4 otherwise CMP normal, Dilantin 9 11/13 TSH 2.6   ASSESSMENT/PLAN: 1. CVA-continue supportive care. 2. hyperlipidemia -- on maximum dose of Crestor. Recheck fasting lipid panel. 3. hypertension-well-controlled. 4. peripheral  neuropathy-stable. 5. DVT-continue Coumadin. 6. seizure disorder-well controlled. 7. check CBC, CMP and Dilantin level.  CPT CODE: 16109

## 2012-12-20 ENCOUNTER — Non-Acute Institutional Stay (SKILLED_NURSING_FACILITY): Payer: Medicare Other | Admitting: Internal Medicine

## 2012-12-20 DIAGNOSIS — E7800 Pure hypercholesterolemia, unspecified: Secondary | ICD-10-CM

## 2012-12-20 DIAGNOSIS — I639 Cerebral infarction, unspecified: Secondary | ICD-10-CM

## 2012-12-20 DIAGNOSIS — G609 Hereditary and idiopathic neuropathy, unspecified: Secondary | ICD-10-CM

## 2012-12-20 DIAGNOSIS — I10 Essential (primary) hypertension: Secondary | ICD-10-CM

## 2012-12-20 DIAGNOSIS — I6350 Cerebral infarction due to unspecified occlusion or stenosis of unspecified cerebral artery: Secondary | ICD-10-CM

## 2012-12-20 NOTE — Progress Notes (Signed)
PROGRESS NOTE  DATE: 12-20-12  FACILITY: [facility]  LEVEL OF CARE: SNF  Routine Visit  CHIEF COMPLAINT:  Manage CVA and hyperlipidemia  HISTORY OF PRESENT ILLNESS:  REASSESSMENT OF ONGOING PROBLEM(S):  CVA: The patient's CVA remains stable.  Patient denies new neurologic symptoms such as numbness, tingling, weakness, speech difficulties or visual disturbances.  No complications reported from the medications currently being used. He has right-sided hemiparesis.  HYPERLIPIDEMIA: No complications from the medications presently being used. Last fasting lipid panel showed : LDL 120 otherwise Fasting lipid panel normal in 12/13, in 6/14 triglycerides 188, HDL 38 otherwise fasting lipid panel normal  PAST MEDICAL HISTORY : Reviewed.  No changes.  CURRENT MEDICATIONS: Reviewed per [facility]  REVIEW OF SYSTEMS:  GENERAL: no change in appetite, no fatigue, no weight changes, no fever, chills or weakness RESPIRATORY: no cough, SOB, DOE, wheezing, hemoptysis CARDIAC: no chest pain, or palpitations, complains of lower extremity swelling GI: no abdominal pain, diarrhea, constipation, heart burn, nausea or vomiting  PHYSICAL EXAMINATION  VS:  T 97.1      P76      RR 19      BP 116/56     POX %     WT (Lb) to 255  GENERAL: no acute distress, morbidly obese body habitus NECK: supple, trachea midline, no neck masses, no thyroid tenderness, no thyromegaly RESPIRATORY: breathing is even & unlabored, BS CTAB CARDIAC: RRR, no murmur,no extra heart sounds, +2 bilateral lower extremity edema GI: abdomen soft, normal BS, no masses, no tenderness, no hepatomegaly, no splenomegaly PSYCHIATRIC: the patient is alert & oriented to person, affect & behavior appropriate  LABS/RADIOLOGY:  6/14 CBC normal, total protein 5.3, albumin 3.4 otherwise CMP normal, Dilantin level 12.1 12/13 CBC normal, total protein 5.4 otherwise CMP normal, Dilantin 9 11/13 TSH 2.6  ASSESSMENT/PLAN: 1. CVA-continue  supportive care. 2. hyperlipidemia -- on maximum dose of Crestor.  3. hypertension-well-controlled. 4. peripheral neuropathy-stable. 5. DVT-continue Coumadin. 6. seizure disorder-well controlled.  CPT CODE: 41324

## 2012-12-27 ENCOUNTER — Ambulatory Visit (HOSPITAL_BASED_OUTPATIENT_CLINIC_OR_DEPARTMENT_OTHER): Payer: Medicare Other | Admitting: Oncology

## 2012-12-27 ENCOUNTER — Telehealth: Payer: Self-pay | Admitting: Oncology

## 2012-12-27 ENCOUNTER — Other Ambulatory Visit (HOSPITAL_BASED_OUTPATIENT_CLINIC_OR_DEPARTMENT_OTHER): Payer: Medicare Other | Admitting: Lab

## 2012-12-27 ENCOUNTER — Encounter: Payer: Self-pay | Admitting: Oncology

## 2012-12-27 VITALS — BP 112/75 | HR 61 | Temp 98.3°F | Resp 17 | Ht 73.0 in

## 2012-12-27 DIAGNOSIS — I82409 Acute embolism and thrombosis of unspecified deep veins of unspecified lower extremity: Secondary | ICD-10-CM | POA: Diagnosis not present

## 2012-12-27 DIAGNOSIS — I639 Cerebral infarction, unspecified: Secondary | ICD-10-CM

## 2012-12-27 DIAGNOSIS — Z7901 Long term (current) use of anticoagulants: Secondary | ICD-10-CM

## 2012-12-27 DIAGNOSIS — D6859 Other primary thrombophilia: Secondary | ICD-10-CM

## 2012-12-27 LAB — COMPREHENSIVE METABOLIC PANEL (CC13)
ALT: 34 U/L (ref 0–55)
Alkaline Phosphatase: 122 U/L (ref 40–150)
CO2: 26 mEq/L (ref 22–29)
Sodium: 139 mEq/L (ref 136–145)
Total Bilirubin: 0.2 mg/dL (ref 0.20–1.20)
Total Protein: 6.6 g/dL (ref 6.4–8.3)

## 2012-12-27 LAB — CBC WITH DIFFERENTIAL/PLATELET
Eosinophils Absolute: 0.4 10*3/uL (ref 0.0–0.5)
HGB: 14.1 g/dL (ref 13.0–17.1)
LYMPH%: 27 % (ref 14.0–49.0)
MONO#: 0.6 10*3/uL (ref 0.1–0.9)
NEUT#: 4.1 10*3/uL (ref 1.5–6.5)
Platelets: 190 10*3/uL (ref 140–400)
RBC: 4.64 10*6/uL (ref 4.20–5.82)
WBC: 6.9 10*3/uL (ref 4.0–10.3)
nRBC: 0 % (ref 0–0)

## 2012-12-27 LAB — PROTIME-INR: Protime: 36 Seconds — ABNORMAL HIGH (ref 10.6–13.4)

## 2012-12-27 NOTE — Progress Notes (Signed)
[facility]  Telephone:(336) [phone] Fax:(336) [phone]   OFFICE PROGRESS NOTE   Cc:  DASANAYAKA,GAYANI, MD   DIAGNOSIS:  1. 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  2. Patent foramen ovale. 3. History of recurrent DVT's and CVA.  PAST THERAPY: Coumadin in 2010 when presented with CVA, bilateral PE. His anticoagulant was switched to Lovenox in July 2012 when he had supratherapeutic INR and large abdominal hematoma. He was thus placed on Lovenox 130 mg SQ B.i.d while awaiting Dr. Earmon Phoenix decision whether to intervene on his PFO with planned to switch back to Coumadin with close monitoring. He had another right lower extremity DVT 08/2011. He was switched back to Coumadin since 08/21/2011.   CURRENT THERAPY: Coumadin alternating between 4.5 mg alternating with 5 mg PO qhs.   INTERVAL HISTORY: James Villegas 57 y.o. male returns for regular follow up with a nurse aid from the nursing home.  He reported no leg pain, chest pain, abdominal pain.  He denied bleeding from Coumadin.  He however does have constipation.  He takes stool softener on a routine basis.  When he strains, he notices slight fresh blood on toilet paper.   He still has right sided weakness from past stroke.  He is still able to move his left arm and leg.  However, due to limitation at the nursing home facility, he does not get ambulation every day.  He is mainly wheelchair bound.  He noticed bilateral pedal edema without PND, orthopnea, SOB.   Patient denies fever, anorexia, weight loss, fatigue, headache, visual changes, confusion, drenching night sweats, palpable lymph node swelling, mucositis, odynophagia, dysphagia, nausea vomiting, jaundice, chest pain, palpitation, shortness of breath, dyspnea on exertion, productive cough, gum bleeding, epistaxis, hematemesis, hemoptysis, abdominal pain, abdominal swelling, early satiety, melena, hematuria, skin rash,  spontaneous bleeding, joint swelling, joint pain, heat or cold intolerance, bowel bladder incontinence, back pain, paresthesia, depression.   Past Medical History  Diagnosis Date  . 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 Lupus anticoagulant disorder 1990  . DVT of lower extremity (deep venous thrombosis)     RIGHT LOWER; s/p IVC filter 7/12  . Dysphagia   . Hyperlipidemia   . Depression   . Anxiety disorder   . Hypertension   . Anemia, secondary     SECONDARY TO ACUTE BLOOD LOSS  . Seizure disorder   . Morbid obesity   . Protein C deficiency   . Protein S deficiency   . PFO (patent foramen ovale)     TEE 2/10: EF 60%, trivial AI, mild Ao root dilatation, mod PFO with R-L shunting, atrial septal aneurysm;   echo 7/12: EF 65-70%, grade 1 diast dysfxn, mild MR, LVOT showed severe obstruction  . Rectus sheath hematoma 7/12    required reversal of anticoagulation and c/b DVT req. IVC filter  . Bloody stool 08/29/2011    intermittent along with constipation.     Past Surgical History  Procedure Laterality Date  . Vena cavogram  12/2010    INFERIOR  . Dental extraction      multiple    Current Outpatient Prescriptions  Medication Sig Dispense Refill  . baclofen (LIORESAL) 20 MG tablet Take 20 mg by mouth 3 (three) times daily.        . citalopram (CELEXA) 20 MG tablet Take 20 mg by mouth daily.      Marland Kitchen docusate sodium (COLACE)  100 MG capsule Take 100 mg by mouth 2 (two) times daily as needed.       . ezetimibe (ZETIA) 10 MG tablet Take 10 mg by mouth daily.      . fish oil-omega-3 fatty acids 1000 MG capsule Take 1 g by mouth 2 (two) times daily before a meal.      . gabapentin (NEURONTIN) 300 MG capsule Take 300 mg by mouth 3 (three) times daily.        . hydrocortisone (ANUSOL-HC) 2.5 % rectal cream Place 1 application rectally 2 (two) times daily as needed.       . metoprolol tartrate (LOPRESSOR) 25 MG tablet Take 25 mg by mouth  2 (two) times daily.       Marland Kitchen omeprazole (PRILOSEC) 20 MG capsule Take 20 mg by mouth 2 (two) times daily.       . phenytoin (DILANTIN) 200 MG ER capsule Take 200 mg by mouth 2 (two) times daily.        . rosuvastatin (CRESTOR) 40 MG tablet Take 40 mg by mouth daily.        Marland Kitchen warfarin (COUMADIN) 5 MG tablet Take 5 mg by mouth daily. 5 mg alternating every other day with 4.5 mg      . warfarin (COUMADIN) 5 MG tablet Take 5 mg by mouth daily. 7.5 mg daily as of 08/29/11       No current facility-administered medications for this visit.    ALLERGIES:  is allergic to fenofibrate.  REVIEW OF SYSTEMS:  The rest of the 14-point review of system was negative.   Filed Vitals:   12/27/12 1359  BP: 112/75  Pulse: 61  Temp: 98.3 F (36.8 C)  Resp: 17   Wt Readings from Last 3 Encounters:  08/29/11 254 lb 9.6 oz (115.486 kg)  06/20/11 267 lb (121.11 kg)  02/18/11 242 lb (109.77 kg)    ECOG Performance status: 2-3 from prior CVA.   PHYSICAL EXAMINATION:   General: well-nourished in no acute distress. Eyes: no scleral icterus. ENT: There were no oropharyngeal lesions. Neck was without thyromegaly. Lymphatics: Negative cervical, supraclavicular or axillary adenopathy. Respiratory: lungs were clear bilaterally without wheezing or crackles. Cardiovascular: Regular rate and rhythm, S1/S2, without murmur, rub or gallop. There was 1+ bilateral, nonpitting pedal edema. GI: abdomen was obese, soft, nontender, nondistended, without organomegaly.  Muscoloskeletal: no spinal tenderness of palpation of vertebral spine. Skin exam was without echymosis, petichae. Neuro exam was showed flaccid in the right arm and leg. Patient was able to get on and off exam table without assistance. Gait was not assessed as he was wheelchair bound. Patient was alerted and oriented. Attention was good. Language was appropriate. Mood was normal without depression. Speech was not pressured but he needed time to answer questions.  Thought content was not tangential.   LABORATORY/RADIOLOGY DATA:  Lab Results  Component Value Date   WBC 6.9 12/27/2012   HGB 14.1 12/27/2012   HCT 41.4 12/27/2012   PLT 190 12/27/2012   GLUCOSE 80 12/27/2012   CHOL 172 12/16/2010   TRIG 160* 12/16/2010   HDL 50 12/16/2010   LDLCALC 90 12/16/2010   ALKPHOS 122 12/27/2012   ALT 34 12/27/2012   AST 19 12/27/2012   NA 139 12/27/2012   K 3.8 12/27/2012   CL 109* 04/26/2012   CREATININE 0.7 12/27/2012   BUN 9.4 12/27/2012   CO2 26 12/27/2012   INR 3.00 12/27/2012   HGBA1C 5.4 12/16/2010    ASSESSMENT  AND PLAN:   1.  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 Lifelong anticoag.  His INR is 3.0 today.  I recommended no change in his current dose of Coumadin.    2.  Patent foramen ovale: Per cardiology, no indication for intervention due to high risk procedure. We'll continue anticoagulation.  3.  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 He is on lifelong anticoagulation.   4.  History of seizure. He is on Dilantin per PCP.  5.  Hyperlipidemia. He is on zetia and fish oil per PCP.  6.  Hypertension. Well controlled on metoprolol.  7.  Constipation with intermittent hematochezia: - Most likely due to immobility and diet.  He denied any known family history of colon cancer. - Normally, patients older than age 20's require screening colonoscopy.  In his case, that will be high risk given his history of recurrent CVA, DVT.   - He does not want to pursue a colonoscopy.   8.  Follow up:  INR check with nursing home pharmacist. Return visit in about 8 months.   The length of time of the face-to-face encounter was 15 minutes. More than 50% of time was spent counseling and coordination of care.

## 2012-12-27 NOTE — Telephone Encounter (Signed)
Gave pt appt for lab and Md on March 2015 °

## 2013-01-14 NOTE — Progress Notes (Signed)
Spoke to [place], DON @ [facility] and [facility] [phone]) about referring patient's Coumadin dosing orders over to facility attending physician, Dr. Ophelia Charter, @ Dr. Felecia Shelling reqeust.  Juan Quam stated that facility physician would take over coumadin dosage.

## 2013-01-24 ENCOUNTER — Non-Acute Institutional Stay (SKILLED_NURSING_FACILITY): Payer: Medicare Other | Admitting: Internal Medicine

## 2013-01-24 DIAGNOSIS — I639 Cerebral infarction, unspecified: Secondary | ICD-10-CM

## 2013-01-24 DIAGNOSIS — E7800 Pure hypercholesterolemia, unspecified: Secondary | ICD-10-CM

## 2013-01-24 DIAGNOSIS — I10 Essential (primary) hypertension: Secondary | ICD-10-CM

## 2013-01-24 DIAGNOSIS — G609 Hereditary and idiopathic neuropathy, unspecified: Secondary | ICD-10-CM

## 2013-01-24 DIAGNOSIS — I6350 Cerebral infarction due to unspecified occlusion or stenosis of unspecified cerebral artery: Secondary | ICD-10-CM

## 2013-01-29 NOTE — Progress Notes (Signed)
PROGRESS NOTE  DATE: 01-24-13  FACILITY: [facility]  LEVEL OF CARE: SNF  Routine Visit  CHIEF COMPLAINT:  Manage CVA and hyperlipidemia  HISTORY OF PRESENT ILLNESS:  REASSESSMENT OF ONGOING PROBLEM(S):  CVA: The patient's CVA remains stable.  Patient denies new neurologic symptoms such as numbness, tingling, weakness, speech difficulties or visual disturbances.  No complications reported from the medications currently being used. He has right-sided hemiparesis.  HYPERLIPIDEMIA: No complications from the medications presently being used. Last fasting lipid panel showed : LDL 120 otherwise Fasting lipid panel normal in 12/13, in 6/14 triglycerides 188, HDL 38 otherwise fasting lipid panel normal  PAST MEDICAL HISTORY : Reviewed.  No changes.  CURRENT MEDICATIONS: Reviewed per [facility]  REVIEW OF SYSTEMS:  GENERAL: no change in appetite, no fatigue, no weight changes, no fever, chills or weakness RESPIRATORY: no cough, SOB, DOE, wheezing, hemoptysis CARDIAC: no chest pain, or palpitations, complains of lower extremity swelling GI: no abdominal pain, diarrhea, constipation, heart burn, nausea or vomiting  PHYSICAL EXAMINATION  VS:  T 98.5      P68      RR 20      BP 118/78     POX %     WT (Lb) to 260  GENERAL: no acute distress, morbidly obese body habitus NECK: supple, trachea midline, no neck masses, no thyroid tenderness, no thyromegaly RESPIRATORY: breathing is even & unlabored, BS CTAB CARDIAC: RRR, no murmur,no extra heart sounds, +2 bilateral lower extremity edema GI: abdomen soft, normal BS, no masses, no tenderness, no hepatomegaly, no splenomegaly PSYCHIATRIC: the patient is alert & oriented to person, affect & behavior appropriate  LABS/RADIOLOGY:  6/14 CBC normal, total protein 5.3, albumin 3.4 otherwise CMP normal, Dilantin level 12.1 12/13 CBC normal, total protein 5.4 otherwise CMP normal, Dilantin 9 11/13 TSH 2.6  ASSESSMENT/PLAN: 1. CVA-continue  supportive care. 2. hyperlipidemia -- on maximum dose of Crestor.  3. hypertension-well-controlled. 4. peripheral neuropathy-stable. 5. DVT-continue Coumadin. 6. seizure disorder-well controlled.  CPT CODE: 11914

## 2013-02-07 DIAGNOSIS — M6281 Muscle weakness (generalized): Secondary | ICD-10-CM | POA: Diagnosis not present

## 2013-02-07 DIAGNOSIS — I6990 Unspecified sequelae of unspecified cerebrovascular disease: Secondary | ICD-10-CM | POA: Diagnosis not present

## 2013-02-07 DIAGNOSIS — R293 Abnormal posture: Secondary | ICD-10-CM | POA: Diagnosis not present

## 2013-02-07 DIAGNOSIS — R279 Unspecified lack of coordination: Secondary | ICD-10-CM | POA: Diagnosis not present

## 2013-02-07 DIAGNOSIS — R269 Unspecified abnormalities of gait and mobility: Secondary | ICD-10-CM | POA: Diagnosis not present

## 2013-02-14 ENCOUNTER — Non-Acute Institutional Stay (SKILLED_NURSING_FACILITY): Payer: Medicare Other | Admitting: Internal Medicine

## 2013-02-14 DIAGNOSIS — G609 Hereditary and idiopathic neuropathy, unspecified: Secondary | ICD-10-CM

## 2013-02-14 DIAGNOSIS — E7800 Pure hypercholesterolemia, unspecified: Secondary | ICD-10-CM

## 2013-02-14 DIAGNOSIS — I639 Cerebral infarction, unspecified: Secondary | ICD-10-CM

## 2013-02-14 DIAGNOSIS — I10 Essential (primary) hypertension: Secondary | ICD-10-CM

## 2013-02-14 DIAGNOSIS — I6350 Cerebral infarction due to unspecified occlusion or stenosis of unspecified cerebral artery: Secondary | ICD-10-CM

## 2013-02-14 NOTE — Progress Notes (Signed)
PROGRESS NOTE  DATE: 02-14-13  FACILITY: [facility]  LEVEL OF CARE: SNF  Routine Visit  CHIEF COMPLAINT:  Manage CVA and hyperlipidemia  HISTORY OF PRESENT ILLNESS:  REASSESSMENT OF ONGOING PROBLEM(S):  CVA: The patient's CVA remains stable.  Patient denies new neurologic symptoms such as numbness, tingling, weakness, speech difficulties or visual disturbances.  No complications reported from the medications currently being used. He has right-sided hemiparesis.  HYPERLIPIDEMIA: No complications from the medications presently being used. Last fasting lipid panel showed : LDL 120 otherwise Fasting lipid panel normal in 12/13, in 6/14 triglycerides 188, HDL 38 otherwise fasting lipid panel normal  PAST MEDICAL HISTORY : Reviewed.  No changes.  CURRENT MEDICATIONS: Reviewed per [facility]  REVIEW OF SYSTEMS:  GENERAL: no change in appetite, no fatigue, no weight changes, no fever, chills or weakness RESPIRATORY: no cough, SOB, DOE, wheezing, hemoptysis CARDIAC: no chest pain, or palpitations, complains of lower extremity swelling GI: no abdominal pain, diarrhea, constipation, heart burn, nausea or vomiting  PHYSICAL EXAMINATION  VS:  T 96.9     P 74      RR 20      BP 136/88     POX %     WT (Lb) 265  GENERAL: no acute distress, morbidly obese body habitus NECK: supple, trachea midline, no neck masses, no thyroid tenderness, no thyromegaly RESPIRATORY: breathing is even & unlabored, BS CTAB CARDIAC: RRR, no murmur,no extra heart sounds, +2 bilateral lower extremity edema GI: abdomen soft, normal BS, no masses, no tenderness, no hepatomegaly, no splenomegaly PSYCHIATRIC: the patient is alert & oriented to person, affect & behavior appropriate  LABS/RADIOLOGY:  6/14 CBC normal, total protein 5.3, albumin 3.4 otherwise CMP normal, Dilantin level 12.1 12/13 CBC normal, total protein 5.4 otherwise CMP normal, Dilantin 9 11/13 TSH 2.6  ASSESSMENT/PLAN:  CVA-continue supportive  care. hyperlipidemia -- on maximum dose of Crestor.  hypertension-well-controlled. peripheral neuropathy-stable. DVT-continue Coumadin. seizure disorder-well controlled.  CPT CODE: 16109

## 2013-02-15 ENCOUNTER — Non-Acute Institutional Stay (SKILLED_NURSING_FACILITY): Payer: Medicare Other | Admitting: Adult Health

## 2013-02-15 DIAGNOSIS — L600 Ingrowing nail: Secondary | ICD-10-CM

## 2013-03-09 DIAGNOSIS — M6281 Muscle weakness (generalized): Secondary | ICD-10-CM | POA: Diagnosis not present

## 2013-03-09 DIAGNOSIS — R279 Unspecified lack of coordination: Secondary | ICD-10-CM | POA: Diagnosis not present

## 2013-03-09 DIAGNOSIS — R269 Unspecified abnormalities of gait and mobility: Secondary | ICD-10-CM | POA: Diagnosis not present

## 2013-03-09 DIAGNOSIS — R293 Abnormal posture: Secondary | ICD-10-CM | POA: Diagnosis not present

## 2013-03-09 DIAGNOSIS — I6990 Unspecified sequelae of unspecified cerebrovascular disease: Secondary | ICD-10-CM | POA: Diagnosis not present

## 2013-03-14 ENCOUNTER — Non-Acute Institutional Stay (SKILLED_NURSING_FACILITY): Payer: Medicare Other | Admitting: Internal Medicine

## 2013-03-14 DIAGNOSIS — G609 Hereditary and idiopathic neuropathy, unspecified: Secondary | ICD-10-CM

## 2013-03-14 DIAGNOSIS — I639 Cerebral infarction, unspecified: Secondary | ICD-10-CM

## 2013-03-14 DIAGNOSIS — I6350 Cerebral infarction due to unspecified occlusion or stenosis of unspecified cerebral artery: Secondary | ICD-10-CM

## 2013-03-14 DIAGNOSIS — I10 Essential (primary) hypertension: Secondary | ICD-10-CM

## 2013-03-14 DIAGNOSIS — E7800 Pure hypercholesterolemia, unspecified: Secondary | ICD-10-CM

## 2013-03-19 NOTE — Progress Notes (Signed)
PROGRESS NOTE  DATE: 03-14-13  FACILITY: [facility]  LEVEL OF CARE: SNF  Routine Visit  CHIEF COMPLAINT:  Manage CVA and hyperlipidemia  HISTORY OF PRESENT ILLNESS:  REASSESSMENT OF ONGOING PROBLEM(S):  CVA: The patient's CVA remains stable.  Patient denies new neurologic symptoms such as numbness, tingling, weakness, speech difficulties or visual disturbances.  No complications reported from the medications currently being used. He has right-sided hemiparesis.  HYPERLIPIDEMIA: No complications from the medications presently being used. Last fasting lipid panel showed : LDL 120 otherwise Fasting lipid panel normal in 12/13, in 6/14 triglycerides 188, HDL 38 otherwise fasting lipid panel normal  PAST MEDICAL HISTORY : Reviewed.  No changes.  CURRENT MEDICATIONS: Reviewed per [facility]  REVIEW OF SYSTEMS:  GENERAL: no change in appetite, no fatigue, no weight changes, no fever, chills or weakness RESPIRATORY: no cough, SOB, DOE, wheezing, hemoptysis CARDIAC: no chest pain, or palpitations, complains of lower extremity swelling GI: no abdominal pain, diarrhea, constipation, heart burn, nausea or vomiting  PHYSICAL EXAMINATION  VS:  T 97.8     P 68      RR 20      BP 92/51    POX %     WT (Lb) 270  GENERAL: no acute distress, morbidly obese body habitus NECK: supple, trachea midline, no neck masses, no thyroid tenderness, no thyromegaly RESPIRATORY: breathing is even & unlabored, BS CTAB CARDIAC: RRR, no murmur,no extra heart sounds, +2 bilateral lower extremity edema GI: abdomen soft, normal BS, no masses, no tenderness, no hepatomegaly, no splenomegaly PSYCHIATRIC: the patient is alert & oriented to person, affect & behavior appropriate  LABS/RADIOLOGY:  6/14 CBC normal, total protein 5.3, albumin 3.4 otherwise CMP normal, Dilantin level 12.1 12/13 CBC normal, total protein 5.4 otherwise CMP normal, Dilantin 9 11/13 TSH 2.6  ASSESSMENT/PLAN:  CVA-continue supportive  care. hyperlipidemia -- on maximum dose of Crestor.  hypertension-well-controlled. peripheral neuropathy-stable. DVT-continue Coumadin. seizure disorder-well controlled.  CPT CODE: 16109

## 2013-03-22 DIAGNOSIS — Z23 Encounter for immunization: Secondary | ICD-10-CM | POA: Diagnosis not present

## 2013-04-06 ENCOUNTER — Emergency Department (HOSPITAL_COMMUNITY)
Admission: EM | Admit: 2013-04-06 | Discharge: 2013-04-06 | Disposition: A | Discharge status: Skilled Nursing Facility | Payer: Medicare Other | Attending: Emergency Medicine | Admitting: Emergency Medicine

## 2013-04-06 ENCOUNTER — Encounter (HOSPITAL_COMMUNITY): Payer: Self-pay | Admitting: Emergency Medicine

## 2013-04-06 DIAGNOSIS — R209 Unspecified disturbances of skin sensation: Secondary | ICD-10-CM | POA: Insufficient documentation

## 2013-04-06 DIAGNOSIS — Z79899 Other long term (current) drug therapy: Secondary | ICD-10-CM | POA: Insufficient documentation

## 2013-04-06 DIAGNOSIS — I10 Essential (primary) hypertension: Secondary | ICD-10-CM | POA: Insufficient documentation

## 2013-04-06 DIAGNOSIS — D6859 Other primary thrombophilia: Secondary | ICD-10-CM | POA: Insufficient documentation

## 2013-04-06 DIAGNOSIS — Z7901 Long term (current) use of anticoagulants: Secondary | ICD-10-CM | POA: Diagnosis not present

## 2013-04-06 DIAGNOSIS — E785 Hyperlipidemia, unspecified: Secondary | ICD-10-CM | POA: Diagnosis not present

## 2013-04-06 DIAGNOSIS — K1370 Unspecified lesions of oral mucosa: Secondary | ICD-10-CM | POA: Insufficient documentation

## 2013-04-06 DIAGNOSIS — Z86718 Personal history of other venous thrombosis and embolism: Secondary | ICD-10-CM | POA: Insufficient documentation

## 2013-04-06 DIAGNOSIS — F329 Major depressive disorder, single episode, unspecified: Secondary | ICD-10-CM | POA: Diagnosis not present

## 2013-04-06 DIAGNOSIS — Y9389 Activity, other specified: Secondary | ICD-10-CM | POA: Insufficient documentation

## 2013-04-06 DIAGNOSIS — G40909 Epilepsy, unspecified, not intractable, without status epilepticus: Secondary | ICD-10-CM | POA: Diagnosis not present

## 2013-04-06 DIAGNOSIS — F411 Generalized anxiety disorder: Secondary | ICD-10-CM | POA: Diagnosis not present

## 2013-04-06 DIAGNOSIS — I69959 Hemiplegia and hemiparesis following unspecified cerebrovascular disease affecting unspecified side: Secondary | ICD-10-CM | POA: Diagnosis not present

## 2013-04-06 DIAGNOSIS — Y9289 Other specified places as the place of occurrence of the external cause: Secondary | ICD-10-CM | POA: Insufficient documentation

## 2013-04-06 DIAGNOSIS — F3289 Other specified depressive episodes: Secondary | ICD-10-CM | POA: Insufficient documentation

## 2013-04-06 DIAGNOSIS — S0993XA Unspecified injury of face, initial encounter: Secondary | ICD-10-CM

## 2013-04-06 DIAGNOSIS — Z87891 Personal history of nicotine dependence: Secondary | ICD-10-CM | POA: Diagnosis not present

## 2013-04-06 DIAGNOSIS — W503XXA Accidental bite by another person, initial encounter: Secondary | ICD-10-CM | POA: Insufficient documentation

## 2013-04-06 LAB — CBC WITH DIFFERENTIAL/PLATELET
Basophils Relative: 0 % (ref 0–1)
Eosinophils Absolute: 0.4 10*3/uL (ref 0.0–0.7)
Eosinophils Relative: 4 % (ref 0–5)
HCT: 38.5 % — ABNORMAL LOW (ref 39.0–52.0)
Hemoglobin: 13.6 g/dL (ref 13.0–17.0)
Lymphs Abs: 1.7 10*3/uL (ref 0.7–4.0)
MCH: 31.1 pg (ref 26.0–34.0)
MCHC: 35.3 g/dL (ref 30.0–36.0)
MCV: 88.1 fL (ref 78.0–100.0)
Monocytes Absolute: 0.9 10*3/uL (ref 0.1–1.0)
Monocytes Relative: 8 % (ref 3–12)
Neutrophils Relative %: 71 % (ref 43–77)
RBC: 4.37 MIL/uL (ref 4.22–5.81)
RDW: 13.4 % (ref 11.5–15.5)

## 2013-04-06 MED ORDER — LIDOCAINE-EPINEPHRINE-TETRACAINE (LET) SOLUTION
3.0000 mL | Freq: Once | NASAL | Status: AC
  Administered 2013-04-06: 19:00:00 3 mL via TOPICAL
  Filled 2013-04-06: qty 3

## 2013-04-06 NOTE — ED Provider Notes (Signed)
CSN: [id number]     Arrival date & time 04/06/13  1617 History   First MD Initiated Contact with Patient 04/06/13 1631     Chief Complaint  Patient presents with  . Facial Laceration   (Consider location/radiation/quality/duration/timing/severity/associated sxs/prior Treatment) The history is provided by the patient and medical records.   This is a 57 year old male with extensive past medical history presenting to the ED for him laceration. Patient had 2 left lower fillings placed this morning by his dentist.  Upon return to the nursing facility he states his mouth was still numb, but attempted to eat lunch and bit his tongue several times.  Mouth has been slowly bleeding ever since. Patient is on chronic Coumadin therapy for recurrent DVTs.  States coumadin was checked yesterday and was 3.0.  Denies any other complaints at this time.  Past Medical History  Diagnosis Date  . 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 Lupus anticoagulant disorder 1990  . DVT of lower extremity (deep venous thrombosis)     RIGHT LOWER; s/p IVC filter 7/12  . Dysphagia   . Hyperlipidemia   . Depression   . Anxiety disorder   . Hypertension   . Anemia, secondary     SECONDARY TO ACUTE BLOOD LOSS  . Seizure disorder   . Morbid obesity   . Protein C deficiency   . Protein S deficiency   . PFO (patent foramen ovale)     TEE 2/10: EF 60%, trivial AI, mild Ao root dilatation, mod PFO with R-L shunting, atrial septal aneurysm;   echo 7/12: EF 65-70%, grade 1 diast dysfxn, mild MR, LVOT showed severe obstruction  . Rectus sheath hematoma 7/12    required reversal of anticoagulation and c/b DVT req. IVC filter  . Bloody stool 08/29/2011    intermittent along with constipation.    Past Surgical History  Procedure Laterality Date  . Vena cavogram  12/2010    INFERIOR  . Dental extraction      multiple   No family history on file. History  Substance Use  Topics  . Smoking status: Former Games developer  . Smokeless tobacco: Not on file  . Alcohol Use: No    Review of Systems  HENT:       Tongue injury  All other systems reviewed and are negative.    Allergies  Fenofibrate  Home Medications   Current Outpatient Rx  Name  Route  Sig  Dispense  Refill  . baclofen (LIORESAL) 20 MG tablet   Oral   Take 20 mg by mouth 3 (three) times daily.           . citalopram (CELEXA) 20 MG tablet   Oral   Take 20 mg by mouth daily.         Marland Kitchen docusate sodium (COLACE) 100 MG capsule   Oral   Take 100 mg by mouth 2 (two) times daily as needed.          . ezetimibe (ZETIA) 10 MG tablet   Oral   Take 10 mg by mouth daily.         . fish oil-omega-3 fatty acids 1000 MG capsule   Oral   Take 1 g by mouth 2 (two) times daily before a meal.         . gabapentin (NEURONTIN) 300 MG capsule   Oral   Take 300 mg by mouth 3 (three) times daily.           Marland Kitchen  hydrocortisone (ANUSOL-HC) 2.5 % rectal cream   Rectal   Place 1 application rectally 2 (two) times daily as needed.          . metoprolol tartrate (LOPRESSOR) 25 MG tablet   Oral   Take 25 mg by mouth 2 (two) times daily.          Marland Kitchen omeprazole (PRILOSEC) 20 MG capsule   Oral   Take 20 mg by mouth 2 (two) times daily.          . rosuvastatin (CRESTOR) 40 MG tablet   Oral   Take 40 mg by mouth daily.            BP 118/59  Pulse 73  Temp(Src) 98.8 F (37.1 C) (Oral)  Resp 18  Ht 6\' 1"  (1.854 m)  Wt 315 lb (142.883 kg)  BMI 41.57 kg/m2  SpO2 95%  Physical Exam  Nursing note and vitals reviewed. Constitutional: He is oriented to person, place, and time. He appears well-developed and well-nourished. No distress.  Morbidly obese  HENT:  Head: Normocephalic and atraumatic.  Mouth/Throat: Oropharynx is clear and moist.  Several areas of avulsed tissue along left and mid tongue; active bleeding; gingiva without noted injury; speaking normally  Eyes: Conjunctivae and  EOM are normal. Pupils are equal, round, and reactive to light.  Neck: Normal range of motion. Neck supple.  Cardiovascular: Normal rate, regular rhythm and normal heart sounds.   Pulmonary/Chest: Effort normal and breath sounds normal. No respiratory distress. He has no wheezes.  Musculoskeletal: Normal range of motion.  Neurological: He is alert and oriented to person, place, and time.  Skin: Skin is warm and dry. He is not diaphoretic.  Psychiatric: He has a normal mood and affect.    ED Course  Procedures (including critical care time) Labs Review Labs Reviewed  PROTIME-INR - Abnormal; Notable for the following:    Prothrombin Time 24.9 (*)    INR 2.34 (*)    All other components within normal limits  CBC WITH DIFFERENTIAL - Abnormal; Notable for the following:    HCT 38.5 (*)    All other components within normal limits   Imaging Review No results found.  EKG Interpretation   None       MDM   1. Tongue injury, initial encounter   2. Chronic anticoagulation    Areas of avulsed tissue on left and mid tongue, no obvious lacerations for repair.  7:12 PM H/H stable.  INR WNL.  Tongue continues to bleed.  Spoke with ENT, Dr. Lazarus Salines-- advised LET is acceptable to try.  If bleeding continues, advised may try silver nitrate.  LET left in place for approx 30 minutes with good control of bleeding.  9:22 PM Clot present over affected areas on tongue without active bleeding.  Will place pt on soft/liquid diet for the next few days to prevent further bleeding/injury.  Discussed plan with pt, he agreed.  Return precautions advised.  Discussed with Dr. Criss Alvine who personally evaluated pt and agrees with plan.  Garlon Hatchet, PA-C 04/06/13 [phone]

## 2013-04-06 NOTE — ED Notes (Signed)
Per EMS pt from [facility] was eating and bit his tongue. Pt takes coumadin. Ems sts few small lacerations. Pt has fillings in teeth replaced today. Pt has gauze in mouth. Still slowly bleeding without pressure.

## 2013-04-06 NOTE — ED Notes (Signed)
Report called to [facility] spoke to [place], RN  [facility] called to transport pt back to facility.

## 2013-04-06 NOTE — ED Notes (Signed)
Pt st's he had 2 fillings in his teeth earlier today and his mouth was numb when he tried to eat.  While eating he bit his tongue.  Pt has lac to top of tongue.  Active bleeding at this time.  Pt holding pressure with 4x4.  Pt is currently taking coumadin.

## 2013-04-06 NOTE — ED Notes (Signed)
Pt's tongue continues to have bleeding.  Let applied at this time on 4x4

## 2013-04-08 NOTE — ED Provider Notes (Signed)
Medical screening examination/treatment/procedure(s) were conducted as a shared visit with non-physician practitioner(s) and myself.  I personally evaluated the patient during the encounter.  EKG Interpretation   None       Patient with tongue bleeding through avulsions from being numb. Bleeding controlled after pressure and then LET. Patient has no sx of anemia, INR in appropriate range for Tx. Will d/c back to facility.  Audree Camel, MD 04/08/13 [phone]

## 2013-04-12 ENCOUNTER — Non-Acute Institutional Stay (SKILLED_NURSING_FACILITY): Payer: Medicare Other | Admitting: Internal Medicine

## 2013-04-12 DIAGNOSIS — E7800 Pure hypercholesterolemia, unspecified: Secondary | ICD-10-CM

## 2013-04-12 DIAGNOSIS — I639 Cerebral infarction, unspecified: Secondary | ICD-10-CM

## 2013-04-12 DIAGNOSIS — I10 Essential (primary) hypertension: Secondary | ICD-10-CM

## 2013-04-12 DIAGNOSIS — I6350 Cerebral infarction due to unspecified occlusion or stenosis of unspecified cerebral artery: Secondary | ICD-10-CM

## 2013-04-12 DIAGNOSIS — G609 Hereditary and idiopathic neuropathy, unspecified: Secondary | ICD-10-CM

## 2013-04-12 NOTE — Progress Notes (Signed)
PROGRESS NOTE  DATE: 04-12-13  FACILITY: [facility]  LEVEL OF CARE: SNF  Routine Visit  CHIEF COMPLAINT:  Manage CVA and hyperlipidemia  HISTORY OF PRESENT ILLNESS:  REASSESSMENT OF ONGOING PROBLEM(S):  CVA: The patient's CVA remains stable.  Patient denies new neurologic symptoms such as numbness, tingling, weakness, speech difficulties or visual disturbances.  No complications reported from the medications currently being used. He has right-sided hemiparesis.  HYPERLIPIDEMIA: No complications from the medications presently being used. Last fasting lipid panel showed : LDL 120 otherwise Fasting lipid panel normal in 12/13, in 6/14 triglycerides 188, HDL 38 otherwise fasting lipid panel normal  PAST MEDICAL HISTORY : Reviewed.  No changes.  CURRENT MEDICATIONS: Reviewed per [facility]  REVIEW OF SYSTEMS:  GENERAL: no change in appetite, no fatigue, no weight changes, no fever, chills or weakness RESPIRATORY: no cough, SOB, DOE, wheezing, hemoptysis CARDIAC: no chest pain, or palpitations, complains of lower extremity swelling GI: no abdominal pain, diarrhea, constipation, heart burn, nausea or vomiting  PHYSICAL EXAMINATION  VS:  T 97.7     P 72      RR 20      BP 120/78    POX %     WT (Lb) 275  GENERAL: no acute distress, morbidly obese body habitus NECK: supple, trachea midline, no neck masses, no thyroid tenderness, no thyromegaly RESPIRATORY: breathing is even & unlabored, BS CTAB CARDIAC: RRR, no murmur,no extra heart sounds, +2 bilateral lower extremity edema GI: abdomen soft, normal BS, no masses, no tenderness, no hepatomegaly, no splenomegaly PSYCHIATRIC: the patient is alert & oriented to person, affect & behavior appropriate  LABS/RADIOLOGY:  6/14 CBC normal, total protein 5.3, albumin 3.4 otherwise CMP normal, Dilantin level 12.1 12/13 CBC normal, total protein 5.4 otherwise CMP normal, Dilantin 9 11/13 TSH 2.6  ASSESSMENT/PLAN:  CVA-continue supportive  care. hyperlipidemia -- on maximum dose of Crestor.  hypertension-well-controlled. peripheral neuropathy-stable. DVT-continue Coumadin. seizure disorder-well controlled.  CPT CODE: 21308

## 2013-04-30 ENCOUNTER — Non-Acute Institutional Stay (SKILLED_NURSING_FACILITY): Payer: Medicare Other | Admitting: Internal Medicine

## 2013-04-30 DIAGNOSIS — L03039 Cellulitis of unspecified toe: Secondary | ICD-10-CM

## 2013-04-30 DIAGNOSIS — L03031 Cellulitis of right toe: Secondary | ICD-10-CM

## 2013-05-17 ENCOUNTER — Non-Acute Institutional Stay (SKILLED_NURSING_FACILITY): Payer: Medicare Other | Admitting: Internal Medicine

## 2013-05-17 DIAGNOSIS — I6350 Cerebral infarction due to unspecified occlusion or stenosis of unspecified cerebral artery: Secondary | ICD-10-CM

## 2013-05-17 DIAGNOSIS — I639 Cerebral infarction, unspecified: Secondary | ICD-10-CM

## 2013-05-17 DIAGNOSIS — E7800 Pure hypercholesterolemia, unspecified: Secondary | ICD-10-CM

## 2013-05-17 DIAGNOSIS — L600 Ingrowing nail: Secondary | ICD-10-CM

## 2013-05-17 DIAGNOSIS — I10 Essential (primary) hypertension: Secondary | ICD-10-CM

## 2013-05-17 NOTE — Progress Notes (Addendum)
Patient ID: CARMELO REIDEL, male   DOB: 1956/05/13, 57 y.o.   MRN: [id number]           PROGRESS NOTE  DATE:  04/29/2013  FACILITY: [facility]    LEVEL OF CARE:   SNF   Acute Visit   CHIEF COMPLAINT:  Review of right great toe.    HISTORY OF PRESENT ILLNESS:  For the last several months, this patient has had a problem with an intense paronychia involving the right great toe.  He has seen Podiatry, where part of the nail was removed.  This was in September.  He received a course of doxycycline.  There are no relevant cultures.      PHYSICAL EXAMINATION:   SKIN:  INSPECTION:  Right great toe:  On either side of the nail, there is paronychia involvement with erythema.  The worrisome part of this is that the erythema is actually spreading down the toe.  The patient rates the pain as 6/10.  He is not a diabetic.  However, he is on Coumadin.    ASSESSMENT/PLAN:  Paronychia of the right great toe.  This is on both sides of the nail and really exquisitely painful.  I have put him back on doxycycline.  We will clean the toe religiously and apply polysporin to the involved surfaces of the nail where it abuts with the soft tissue.  He will also have Epsom Salts foot soaks daily.  If we are not successful with this, he may need to have the entire nail removed which, of course, will need to be done with a digital block.

## 2013-05-18 DIAGNOSIS — L600 Ingrowing nail: Secondary | ICD-10-CM | POA: Diagnosis not present

## 2013-05-18 DIAGNOSIS — M79609 Pain in unspecified limb: Secondary | ICD-10-CM | POA: Diagnosis not present

## 2013-05-19 NOTE — Progress Notes (Signed)
Patient ID: James Villegas, male   DOB: 02/28/1956, 57 y.o.   MRN: [id number]     [facility]  Allergies  Allergen Reactions  . Fenofibrate Other (See Comments)    Per [facility]    Chief Complaint  Patient presents with  . Acute Visit    right great toe nail    HPI Nursing staff has asked me to see his right great toe nail which is hot red and inflamed; swollen and ingrown. There is no drainage present. There are no reports of fever present. He does complain of pain present.   Past Medical History  Diagnosis Date  . 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 Lupus anticoagulant disorder 1990  . DVT of lower extremity (deep venous thrombosis)     RIGHT LOWER; s/p IVC filter 7/12  . Dysphagia   . Hyperlipidemia   . Depression   . Anxiety disorder   . Hypertension   . Anemia, secondary     SECONDARY TO ACUTE BLOOD LOSS  . Seizure disorder   . Morbid obesity   . Protein C deficiency   . Protein S deficiency   . PFO (patent foramen ovale)     TEE 2/10: EF 60%, trivial AI, mild Ao root dilatation, mod PFO with R-L shunting, atrial septal aneurysm;   echo 7/12: EF 65-70%, grade 1 diast dysfxn, mild MR, LVOT showed severe obstruction  . Rectus sheath hematoma 7/12    required reversal of anticoagulation and c/b DVT req. IVC filter  . Bloody stool 08/29/2011    intermittent along with constipation.     Past Surgical History  Procedure Laterality Date  . Vena cavogram  12/2010    INFERIOR  . Dental extraction      multiple    Filed Vitals:   02/15/13 1421  BP: 142/88  Pulse: 74  Height: 5\' 11"  (1.803 m)  Weight: 270 lb (122.471 kg)    MEDICATIONS  Coumadin 4.5 mg daily zetia 10 mg daily celexa 20 mg daily Lopressor 25 mg twice daily prilosec 20 mg twice daily Fish oil 1 gm bid Dilantin 200 mg twice daily Colace bid prn Baclofen 20 mg tid neurontin 300 mg tid crestor 40 mg daily  LABS  11-11-12: wbc 6.3; hgb 12.6;  hct 36.6; mcv 88 ;plt 175; glucose 84; bun 9; creat 0.69; k+4.4; na++142 Liver normal albumin 3.4; chol 155; ldl 79; trig 188; dilantin 12.1    Review of Systems  Constitutional: Negative for fever and malaise/fatigue.  Respiratory: Negative for cough.   Cardiovascular: Negative for chest pain.  Skin:       Right great toe nail is painful and red  Neurological: Negative for headaches.  Psychiatric/Behavioral: The patient is not nervous/anxious.      Physical Exam  Constitutional: No distress.  obese  Neck: Neck supple. No JVD present.  Cardiovascular: Normal rate and regular rhythm.   Respiratory: Effort normal and breath sounds normal. No respiratory distress.  GI: Soft. Bowel sounds are normal. He exhibits no distension. There is no tenderness.  Musculoskeletal: Normal range of motion.  Lymphadenopathy:    He has no cervical adenopathy.  Neurological: He is alert.  Skin: Skin is warm. He is not diaphoretic.  Right great toe red;hot; swollen; inflamed; ingrown there is no drainage present; but is very painful     ASSESSMENT/PAIN  1. Ingrown toe nail: will begin doxycycline 100 mg bid for 3 weeks  and florastor bid for 3 weeks; nursing to inform pharmacy of medication change and will setup podiatry consult asap.   Time spent with patient 30 minutes.

## 2013-05-20 ENCOUNTER — Encounter: Payer: Self-pay | Admitting: Internal Medicine

## 2013-05-20 DIAGNOSIS — L600 Ingrowing nail: Secondary | ICD-10-CM | POA: Insufficient documentation

## 2013-05-20 NOTE — Progress Notes (Signed)
PROGRESS NOTE  DATE: 05-17-13  FACILITY: [facility]  LEVEL OF CARE: SNF  Routine Visit  CHIEF COMPLAINT:  Manage CVA, right great toe infection and hyperlipidemia  HISTORY OF PRESENT ILLNESS:  REASSESSMENT OF ONGOING PROBLEM(S):  CVA: The patient's CVA remains stable.  Patient denies new neurologic symptoms such as numbness, tingling, weakness, speech difficulties or visual disturbances.  No complications reported from the medications currently being used. He has right-sided hemiparesis.  HYPERLIPIDEMIA: No complications from the medications presently being used. Last fasting lipid panel showed : LDL 120 otherwise Fasting lipid panel normal in 12/13, in 6/14 triglycerides 188, HDL 38 otherwise fasting lipid panel normal  RIGHT GREAT TOE INFECTION: Treatment nurse reports that patient has purulent drainage coming from his right great toe. Patient had his nails clipped by the podiatrist. he had just completed a course of doxycycline without any relief. Patient denies fever chills or night sweats.  PAST MEDICAL HISTORY : Reviewed.  No changes.  CURRENT MEDICATIONS: Reviewed per [facility]  REVIEW OF SYSTEMS:  GENERAL: no change in appetite, no fatigue, no weight changes, no fever, chills or weakness RESPIRATORY: no cough, SOB, DOE, wheezing, hemoptysis CARDIAC: no chest pain, or palpitations, complains of lower extremity swelling GI: no abdominal pain, diarrhea, constipation, heart burn, nausea or vomiting  PHYSICAL EXAMINATION  VS:  T 97.5     P 70      RR 20      BP 128/72    POX %     WT (Lb) 275  GENERAL: no acute distress, morbidly obese body habitus EYES: Normal sclerae, normal conjunctivae, no discharge NECK: supple, trachea midline, no neck masses, no thyroid tenderness, no thyromegaly LYMPHATICS: No cervical lymphadenopathy, no supraclavicular lymphadenopathy RESPIRATORY: breathing is even & unlabored, BS CTAB CARDIAC: RRR, no murmur,no extra heart sounds, +2 bilateral  lower extremity edema GI: abdomen soft, normal BS, no masses, no tenderness, no hepatomegaly, no splenomegaly PSYCHIATRIC: the patient is alert & oriented to person, affect & behavior appropriate SKIN: Right great toe has purulent drainage and an ingrown toenail  LABS/RADIOLOGY:  6/14 CBC normal, total protein 5.3, albumin 3.4 otherwise CMP normal, Dilantin level 12.1 12/13 CBC normal, total protein 5.4 otherwise CMP normal, Dilantin 9 11/13 TSH 2.6  ASSESSMENT/PLAN:  Right great toe infection-uncontrolled problem. Send culture and sensitivities of drainage. Start Augmentin 875 mg twice a day for 10 days and probiotics twice a day for 14 days. Will  rerequest podiatry consult to remove the toenail. CVA-continue supportive care. hyperlipidemia -- on maximum dose of Crestor. Check fasting lipid panel hypertension-well-controlled. peripheral neuropathy-stable. DVT-continue Coumadin. seizure disorder-well controlled. Check CBC, CMP and Dilantin level  CPT CODE: 16109

## 2013-06-14 ENCOUNTER — Non-Acute Institutional Stay (SKILLED_NURSING_FACILITY): Payer: Medicare Other | Admitting: Internal Medicine

## 2013-06-14 DIAGNOSIS — G609 Hereditary and idiopathic neuropathy, unspecified: Secondary | ICD-10-CM

## 2013-06-14 DIAGNOSIS — I10 Essential (primary) hypertension: Secondary | ICD-10-CM

## 2013-06-14 DIAGNOSIS — E7800 Pure hypercholesterolemia, unspecified: Secondary | ICD-10-CM

## 2013-06-14 DIAGNOSIS — I6350 Cerebral infarction due to unspecified occlusion or stenosis of unspecified cerebral artery: Secondary | ICD-10-CM | POA: Diagnosis not present

## 2013-06-14 DIAGNOSIS — I639 Cerebral infarction, unspecified: Secondary | ICD-10-CM

## 2013-06-17 DIAGNOSIS — L988 Other specified disorders of the skin and subcutaneous tissue: Secondary | ICD-10-CM | POA: Diagnosis not present

## 2013-06-17 NOTE — Progress Notes (Signed)
          PROGRESS NOTE  DATE: 06-14-13  FACILITY: [facility]  LEVEL OF CARE: SNF  Routine Visit  CHIEF COMPLAINT:  Manage CVA and hyperlipidemia  HISTORY OF PRESENT ILLNESS:  REASSESSMENT OF ONGOING PROBLEM(S):  CVA: The patient's CVA remains stable.  Patient denies new neurologic symptoms such as numbness, tingling, weakness, speech difficulties or visual disturbances.  No complications reported from the medications currently being used. He has right-sided hemiparesis.  HYPERLIPIDEMIA: No complications from the medications presently being used. Last fasting lipid panel showed : LDL 120 otherwise Fasting lipid panel normal in 12/13, in 6/14 triglycerides 188, HDL 38 otherwise fasting lipid panel normal, and 12-14 triglycerides 283, LDL 101, total cholesterol 198, HDL 40  PAST MEDICAL HISTORY : Reviewed.  No changes.  CURRENT MEDICATIONS: Reviewed per [facility]  REVIEW OF SYSTEMS:  GENERAL: no change in appetite, no fatigue, no weight changes, no fever, chills or weakness RESPIRATORY: no cough, SOB, DOE, wheezing, hemoptysis CARDIAC: no chest pain, or palpitations, complains of lower extremity swelling GI: no abdominal pain, diarrhea, constipation, heart burn, nausea or vomiting  PHYSICAL EXAMINATION  VS:  T 98.8     P 66     RR 18      BP 148/90    POX %     WT (Lb) 280  GENERAL: no acute distress, morbidly obese body habitus NECK: supple, trachea midline, no neck masses, no thyroid tenderness, no thyromegaly RESPIRATORY: breathing is even & unlabored, BS CTAB CARDIAC: RRR, no murmur,no extra heart sounds, +2 bilateral lower extremity edema GI: abdomen soft, normal BS, no masses, no tenderness, no hepatomegaly, no splenomegaly PSYCHIATRIC: the patient is alert & oriented to person, affect & behavior appropriate SKIN: Right great toe has purulent drainage and an ingrown toenail  LABS/RADIOLOGY:  12-14 liver profile normal, hemoglobin A1c 6, CBC normal, total protein 5.4,  alkaline phosphatase 138 otherwise CMP normal, Dilantin level 12  6/14 CBC normal, total protein 5.3, albumin 3.4 otherwise CMP normal, Dilantin level 12.1 12/13 CBC normal, total protein 5.4 otherwise CMP normal, Dilantin 9 11/13 TSH 2.6  ASSESSMENT/PLAN:  CVA-continue supportive care. hyperlipidemia -- on maximum dose of Crestor.  hypertension-well-controlled. peripheral neuropathy-stable. DVT-continue Coumadin. seizure disorder-well controlled.  CPT CODE: 1610999308

## 2013-06-28 ENCOUNTER — Non-Acute Institutional Stay (SKILLED_NURSING_FACILITY): Payer: Medicare Other | Admitting: Internal Medicine

## 2013-06-28 DIAGNOSIS — R1902 Left upper quadrant abdominal swelling, mass and lump: Secondary | ICD-10-CM | POA: Diagnosis not present

## 2013-06-28 DIAGNOSIS — D649 Anemia, unspecified: Secondary | ICD-10-CM | POA: Diagnosis not present

## 2013-06-29 ENCOUNTER — Encounter: Payer: Self-pay | Admitting: Family

## 2013-06-29 ENCOUNTER — Non-Acute Institutional Stay (SKILLED_NURSING_FACILITY): Payer: Medicare Other | Admitting: Family

## 2013-06-29 DIAGNOSIS — I6350 Cerebral infarction due to unspecified occlusion or stenosis of unspecified cerebral artery: Secondary | ICD-10-CM

## 2013-06-29 DIAGNOSIS — I10 Essential (primary) hypertension: Secondary | ICD-10-CM

## 2013-06-29 DIAGNOSIS — R109 Unspecified abdominal pain: Secondary | ICD-10-CM | POA: Diagnosis not present

## 2013-06-29 DIAGNOSIS — D649 Anemia, unspecified: Secondary | ICD-10-CM | POA: Diagnosis not present

## 2013-06-29 DIAGNOSIS — R1902 Left upper quadrant abdominal swelling, mass and lump: Secondary | ICD-10-CM

## 2013-06-29 DIAGNOSIS — Q442 Atresia of bile ducts: Secondary | ICD-10-CM | POA: Diagnosis not present

## 2013-06-29 NOTE — Progress Notes (Signed)
Patient ID: James Villegas, male   DOB: [birth year], 58 y.o.   MRN: [id number]  Date: 06/29/13 Facility: [facility]  Code Status:  DNR  Chief Complaint  Patient presents with  . Acute Visit    Abdominal Pain    HPI: Pt c/o acute abdominal pain onset x 3 days, located in LUQ. Pt describes pain as a dull ache. Pt reports associated constipation; denies N/V/fever, chills. Pt  Reports LBM 06/27/13 . Pt further reports tenderness of LUQ.  No further issues/concerns expressed at present.       Allergies  Allergen Reactions  . Fenofibrate Other (See Comments)    Per [facility]     Medication List       This list is accurate as of: 06/29/13  3:47 PM.  Always use your most recent med list.               baclofen 20 MG tablet  Commonly known as:  LIORESAL  Take 20 mg by mouth 3 (three) times daily.     citalopram 20 MG tablet  Commonly known as:  CELEXA  Take 20 mg by mouth daily.     docusate sodium 100 MG capsule  Commonly known as:  COLACE  Take 100 mg by mouth 2 (two) times daily as needed for constipation.     ezetimibe 10 MG tablet  Commonly known as:  ZETIA  Take 10 mg by mouth daily.     fish oil-omega-3 fatty acids 1000 MG capsule  Take 1 g by mouth 2 (two) times daily before a meal.     gabapentin 300 MG capsule  Commonly known as:  NEURONTIN  Take 300 mg by mouth 3 (three) times daily.     hydrocortisone 2.5 % rectal cream  Commonly known as:  ANUSOL-HC  Place 1 application rectally 2 (two) times daily as needed for hemorrhoids.     metoprolol tartrate 25 MG tablet  Commonly known as:  LOPRESSOR  Take 25 mg by mouth 2 (two) times daily.     omeprazole 20 MG capsule  Commonly known as:  PRILOSEC  Take 20 mg by mouth 2 (two) times daily.     phenytoin 100 MG ER capsule  Commonly known as:  DILANTIN  Take 200 mg by mouth 2 (two) times daily.     rosuvastatin 40 MG tablet  Commonly known as:  CRESTOR  Take 40 mg by mouth at bedtime.     warfarin 4  MG tablet  Commonly known as:  COUMADIN  Take 4 mg by mouth every evening.         DATA REVIEWED Radiology: Hypoechoic area which appears to be within the wall of the abdomen.  This could be just deep to the abdominal wall within the peritoneal cavity as well.  Findings are nonspecifc. Hematoma could have this appearance. An abscess could also have this appearance. Recommend correlation with CT  Laboratory Studies: 06/08/13: WBC 5.8, Hemoglobin 13.1, Hematocrit 38.2, Platelets 230     Past Medical History  Diagnosis Date  . 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 Lupus anticoagulant disorder 1990  . DVT of lower extremity (deep venous thrombosis)     RIGHT LOWER; s/p IVC filter 7/12  . Dysphagia   . Hyperlipidemia   . Depression   . Anxiety disorder   . Hypertension   . Anemia, secondary     SECONDARY TO ACUTE  BLOOD LOSS  . Seizure disorder   . Morbid obesity   . Protein C deficiency   . Protein S deficiency   . PFO (patent foramen ovale)     TEE 2/10: EF 60%, trivial AI, mild Ao root dilatation, mod PFO with R-L shunting, atrial septal aneurysm;   echo 7/12: EF 65-70%, grade 1 diast dysfxn, mild MR, LVOT showed severe obstruction  . Rectus sheath hematoma 7/12    required reversal of anticoagulation and c/b DVT req. IVC filter  . Bloody stool 08/29/2011    intermittent along with constipation.       Past Surgical History  Procedure Laterality Date  . Vena cavogram  12/2010    INFERIOR  . Dental extraction      multiple     Review of Systems  Respiratory: Positive for cough.   Cardiovascular: Negative.   Gastrointestinal: Positive for abdominal pain and constipation. Negative for nausea and vomiting.       LBM 1/19  Neurological: Negative.      Physical Exam Filed Vitals:   06/29/13 1524  BP: 128/88  Pulse: 67  Temp: 98.6 F (37 C)  Resp: 18  SpO2: 97%   Physical Exam  Constitutional: He is oriented  to person, place, and time.  Morbidly obese  Cardiovascular: Normal rate and regular rhythm.   Pulmonary/Chest: Effort normal and breath sounds normal.  Abdominal: Bowel sounds are decreased. There is no hepatosplenomegaly. There is tenderness in the left upper quadrant. There is guarding.    Obese abdomen  Neurological: He is alert and oriented to person, place, and time.   ASSESSMENT/PLAN  Hypoechoic Mass- Refer to GI CT of abdomen w/ or w/o contrast CBC w/diff Stat BMP/Amylase/Lipase/Hepatic Panel Dulcolax supp x 1   Follow up:prn  2 hours

## 2013-06-30 ENCOUNTER — Encounter (HOSPITAL_COMMUNITY): Payer: Self-pay | Admitting: Emergency Medicine

## 2013-06-30 ENCOUNTER — Emergency Department (HOSPITAL_COMMUNITY): Payer: Medicare Other

## 2013-06-30 ENCOUNTER — Emergency Department (HOSPITAL_COMMUNITY)
Admission: EM | Admit: 2013-06-30 | Discharge: 2013-06-30 | Disposition: A | Discharge status: Home / Self Care | Payer: Medicare Other | Attending: Emergency Medicine | Admitting: Emergency Medicine

## 2013-06-30 DIAGNOSIS — Z9889 Other specified postprocedural states: Secondary | ICD-10-CM | POA: Diagnosis not present

## 2013-06-30 DIAGNOSIS — F329 Major depressive disorder, single episode, unspecified: Secondary | ICD-10-CM | POA: Insufficient documentation

## 2013-06-30 DIAGNOSIS — Z8639 Personal history of other endocrine, nutritional and metabolic disease: Secondary | ICD-10-CM | POA: Diagnosis not present

## 2013-06-30 DIAGNOSIS — Z86718 Personal history of other venous thrombosis and embolism: Secondary | ICD-10-CM | POA: Insufficient documentation

## 2013-06-30 DIAGNOSIS — Z87891 Personal history of nicotine dependence: Secondary | ICD-10-CM | POA: Insufficient documentation

## 2013-06-30 DIAGNOSIS — F411 Generalized anxiety disorder: Secondary | ICD-10-CM | POA: Insufficient documentation

## 2013-06-30 DIAGNOSIS — Z7901 Long term (current) use of anticoagulants: Secondary | ICD-10-CM | POA: Insufficient documentation

## 2013-06-30 DIAGNOSIS — I10 Essential (primary) hypertension: Secondary | ICD-10-CM | POA: Diagnosis not present

## 2013-06-30 DIAGNOSIS — M7981 Nontraumatic hematoma of soft tissue: Secondary | ICD-10-CM | POA: Insufficient documentation

## 2013-06-30 DIAGNOSIS — Z79899 Other long term (current) drug therapy: Secondary | ICD-10-CM | POA: Insufficient documentation

## 2013-06-30 DIAGNOSIS — R1013 Epigastric pain: Secondary | ICD-10-CM | POA: Insufficient documentation

## 2013-06-30 DIAGNOSIS — I69959 Hemiplegia and hemiparesis following unspecified cerebrovascular disease affecting unspecified side: Secondary | ICD-10-CM | POA: Diagnosis not present

## 2013-06-30 DIAGNOSIS — R109 Unspecified abdominal pain: Secondary | ICD-10-CM | POA: Diagnosis not present

## 2013-06-30 DIAGNOSIS — F3289 Other specified depressive episodes: Secondary | ICD-10-CM | POA: Diagnosis not present

## 2013-06-30 DIAGNOSIS — R10819 Abdominal tenderness, unspecified site: Secondary | ICD-10-CM | POA: Diagnosis not present

## 2013-06-30 DIAGNOSIS — K2970 Gastritis, unspecified, without bleeding: Secondary | ICD-10-CM | POA: Diagnosis not present

## 2013-06-30 DIAGNOSIS — IMO0002 Reserved for concepts with insufficient information to code with codable children: Secondary | ICD-10-CM | POA: Diagnosis not present

## 2013-06-30 DIAGNOSIS — R1084 Generalized abdominal pain: Secondary | ICD-10-CM | POA: Diagnosis not present

## 2013-06-30 DIAGNOSIS — Z862 Personal history of diseases of the blood and blood-forming organs and certain disorders involving the immune mechanism: Secondary | ICD-10-CM | POA: Diagnosis not present

## 2013-06-30 DIAGNOSIS — E785 Hyperlipidemia, unspecified: Secondary | ICD-10-CM | POA: Insufficient documentation

## 2013-06-30 DIAGNOSIS — G40909 Epilepsy, unspecified, not intractable, without status epilepticus: Secondary | ICD-10-CM | POA: Diagnosis not present

## 2013-06-30 DIAGNOSIS — S301XXA Contusion of abdominal wall, initial encounter: Secondary | ICD-10-CM | POA: Diagnosis not present

## 2013-06-30 DIAGNOSIS — K7689 Other specified diseases of liver: Secondary | ICD-10-CM | POA: Diagnosis not present

## 2013-06-30 DIAGNOSIS — Z8774 Personal history of (corrected) congenital malformations of heart and circulatory system: Secondary | ICD-10-CM | POA: Diagnosis not present

## 2013-06-30 LAB — CBC WITH DIFFERENTIAL/PLATELET
BASOS ABS: 0 10*3/uL (ref 0.0–0.1)
Basophils Relative: 0 % (ref 0–1)
Eosinophils Absolute: 0.2 10*3/uL (ref 0.0–0.7)
Eosinophils Relative: 2 % (ref 0–5)
HEMATOCRIT: 33.4 % — AB (ref 39.0–52.0)
HEMOGLOBIN: 11.5 g/dL — AB (ref 13.0–17.0)
LYMPHS ABS: 1.4 10*3/uL (ref 0.7–4.0)
LYMPHS PCT: 10 % — AB (ref 12–46)
MCH: 30.6 pg (ref 26.0–34.0)
MCHC: 34.4 g/dL (ref 30.0–36.0)
MCV: 88.8 fL (ref 78.0–100.0)
MONO ABS: 1.5 10*3/uL — AB (ref 0.1–1.0)
MONOS PCT: 11 % (ref 3–12)
NEUTROS ABS: 10.4 10*3/uL — AB (ref 1.7–7.7)
Neutrophils Relative %: 77 % (ref 43–77)
Platelets: 211 10*3/uL (ref 150–400)
RBC: 3.76 MIL/uL — AB (ref 4.22–5.81)
RDW: 13.5 % (ref 11.5–15.5)
WBC: 13.5 10*3/uL — AB (ref 4.0–10.5)

## 2013-06-30 LAB — COMPREHENSIVE METABOLIC PANEL
ALT: 12 U/L (ref 0–53)
AST: 17 U/L (ref 0–37)
Albumin: 2.5 g/dL — ABNORMAL LOW (ref 3.5–5.2)
Alkaline Phosphatase: 84 U/L (ref 39–117)
BUN: 14 mg/dL (ref 6–23)
CO2: 24 meq/L (ref 19–32)
CREATININE: 0.62 mg/dL (ref 0.50–1.35)
Calcium: 8.4 mg/dL (ref 8.4–10.5)
Chloride: 100 mEq/L (ref 96–112)
GFR calc Af Amer: >90 mL/min (ref >90)
GFR calc non Af Amer: >90 mL/min (ref >90)
Glucose, Bld: 104 mg/dL — ABNORMAL HIGH (ref 70–99)
Potassium: 4.7 mEq/L (ref 3.7–5.3)
Sodium: 137 mEq/L (ref 137–147)
Total Bilirubin: 0.3 mg/dL (ref 0.3–1.2)
Total Protein: 6.2 g/dL (ref 6.0–8.3)

## 2013-06-30 LAB — PROTIME-INR
INR: 2.63 — ABNORMAL HIGH (ref 0.00–1.49)
PROTHROMBIN TIME: 27.2 s — AB (ref 11.6–15.2)

## 2013-06-30 LAB — POCT I-STAT TROPONIN I: Troponin i, poc: 0 ng/mL (ref 0.00–0.08)

## 2013-06-30 LAB — LIPASE, BLOOD: Lipase: 18 U/L (ref 11–59)

## 2013-06-30 MED ORDER — MORPHINE SULFATE 4 MG/ML IJ SOLN
4.0000 mg | Freq: Once | INTRAMUSCULAR | Status: AC
  Administered 2013-06-30: 4 mg via INTRAVENOUS
  Filled 2013-06-30: qty 1

## 2013-06-30 MED ORDER — IOHEXOL 300 MG/ML  SOLN
100.0000 mL | Freq: Once | INTRAMUSCULAR | Status: AC | PRN
  Administered 2013-06-30: 100 mL via INTRAVENOUS

## 2013-06-30 MED ORDER — IOHEXOL 300 MG/ML  SOLN: 25.0000 mL | INTRAMUSCULAR | Status: AC

## 2013-06-30 NOTE — ED Notes (Signed)
James HickmanSusan Villegas, [place] [phone] (sister)

## 2013-06-30 NOTE — ED Notes (Signed)
Per EMS- pt has abdominal pain for 2 days. Pt has abdominal distension, tenderness and guarding. Pt has reported fever since last night as well. Pt reports having cough for 2 weeks as well. Pt is from [facility]. VSS with EMS

## 2013-06-30 NOTE — ED Notes (Signed)
Ptar at bedside. Given appropriate documentation.

## 2013-06-30 NOTE — ED Provider Notes (Signed)
CSN: [id number]     Arrival date & time 06/30/13  [id number] History   First MD Initiated Contact with Patient 06/30/13 [phone]     Chief Complaint  Patient presents with  . Abdominal Pain   (Consider location/radiation/quality/duration/timing/severity/associated sxs/prior Treatment) The history is provided by the patient and medical records.   This is a 58 year old male with past medical history significant for hypertension, hyperlipidemia, depression, anxiety, prior CVA with residual right hemiparesis, presenting to the ED from [facility] facility for left upper quadrant and epigastric abdominal pain for the past 2 days.  Patient thought he was constipated, so he notified nursing staff and was given suppository.  Did have a BM last night which he thinks was normal. Patient was found to be distended along his left upper abdomen with exquisite tenderness and some guarding. Ultrasound was performed a nursing facility yesterday, with questionable abscess, recommended further evaluation with CT. Patient had a low-grade temperature last night of 100.4, was given Tylenol with good relief.  Patient denies any nausea, vomiting, or diarrhea.  Patient is on daily Coumadin for recurrent DVTs.  VS stable on arrival.  Past Medical History  Diagnosis Date  . 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 Lupus anticoagulant disorder 1990  . DVT of lower extremity (deep venous thrombosis)     RIGHT LOWER; s/p IVC filter 7/12  . Dysphagia   . Hyperlipidemia   . Depression   . Anxiety disorder   . Hypertension   . Anemia, secondary     SECONDARY TO ACUTE BLOOD LOSS  . Seizure disorder   . Morbid obesity   . Protein C deficiency   . Protein S deficiency   . PFO (patent foramen ovale)     TEE 2/10: EF 60%, trivial AI, mild Ao root dilatation, mod PFO with R-L shunting, atrial septal aneurysm;   echo 7/12: EF 65-70%, grade 1 diast dysfxn, mild MR, LVOT showed  severe obstruction  . Rectus sheath hematoma 7/12    required reversal of anticoagulation and c/b DVT req. IVC filter  . Bloody stool 08/29/2011    intermittent along with constipation.    Past Surgical History  Procedure Laterality Date  . Vena cavogram  12/2010    INFERIOR  . Dental extraction      multiple   History reviewed. No pertinent family history. History  Substance Use Topics  . Smoking status: Former Games developermoker  . Smokeless tobacco: Not on file  . Alcohol Use: No    Review of Systems  Gastrointestinal: Positive for abdominal pain and abdominal distention.  All other systems reviewed and are negative.    Allergies  Fenofibrate  Home Medications   Current Outpatient Rx  Name  Route  Sig  Dispense  Refill  . baclofen (LIORESAL) 20 MG tablet   Oral   Take 20 mg by mouth 3 (three) times daily.           . citalopram (CELEXA) 20 MG tablet   Oral   Take 20 mg by mouth daily.         Marland Kitchen. docusate sodium (COLACE) 100 MG capsule   Oral   Take 100 mg by mouth 2 (two) times daily as needed for constipation.          Marland Kitchen. ezetimibe (ZETIA) 10 MG tablet   Oral   Take 10 mg by mouth daily.         .Marland Kitchen  fish oil-omega-3 fatty acids 1000 MG capsule   Oral   Take 1 g by mouth 2 (two) times daily before a meal.         . gabapentin (NEURONTIN) 300 MG capsule   Oral   Take 300 mg by mouth 3 (three) times daily.           . hydrocortisone (ANUSOL-HC) 2.5 % rectal cream   Rectal   Place 1 application rectally 2 (two) times daily as needed for hemorrhoids.         . metoprolol tartrate (LOPRESSOR) 25 MG tablet   Oral   Take 25 mg by mouth 2 (two) times daily.          Marland Kitchen omeprazole (PRILOSEC) 20 MG capsule   Oral   Take 20 mg by mouth 2 (two) times daily.          . phenytoin (DILANTIN) 100 MG ER capsule   Oral   Take 200 mg by mouth 2 (two) times daily.         . rosuvastatin (CRESTOR) 40 MG tablet   Oral   Take 40 mg by mouth at bedtime.           Marland Kitchen warfarin (COUMADIN) 4 MG tablet   Oral   Take 4 mg by mouth every evening.          SpO2 96%  Physical Exam  Nursing note and vitals reviewed. Constitutional: He is oriented to person, place, and time. He appears well-developed and well-nourished. No distress.  HENT:  Head: Normocephalic and atraumatic.  Mouth/Throat: Oropharynx is clear and moist.  Eyes: Conjunctivae and EOM are normal. Pupils are equal, round, and reactive to light.  Neck: Normal range of motion. Neck supple.  Cardiovascular: Normal rate, regular rhythm and normal heart sounds.   Pulmonary/Chest: Effort normal and breath sounds normal. No respiratory distress. He has no wheezes.  Abdominal: Soft. Bowel sounds are normal. He exhibits distension. There is tenderness in the epigastric area and left upper quadrant. There is guarding.  Distention along LUQ with TTP and guarding; no external cellulitic changes or lesions  Musculoskeletal: Normal range of motion.  Neurological: He is alert and oriented to person, place, and time.  Right sided hemiparesis unchanged from baseline; moving left upper and lower extremities without evidence of ataxia  Skin: Skin is warm and dry. He is not diaphoretic.  Psychiatric: He has a normal mood and affect.    ED Course  Procedures (including critical care time)   Date: 06/30/2013  Rate: 96  Rhythm: normal sinus rhythm  QRS Axis: normal  Intervals: normal  ST/T Wave abnormalities: normal  Conduction Disutrbances:right bundle branch block and left posterior fascicular block  Narrative Interpretation:   Old EKG Reviewed: unchanged   Labs Review Labs Reviewed  CBC WITH DIFFERENTIAL - Abnormal; Notable for the following:    WBC 13.5 (*)    RBC 3.76 (*)    Hemoglobin 11.5 (*)    HCT 33.4 (*)    Neutro Abs 10.4 (*)    Lymphocytes Relative 10 (*)    Monocytes Absolute 1.5 (*)    All other components within normal limits  COMPREHENSIVE METABOLIC PANEL - Abnormal;  Notable for the following:    Glucose, Bld 104 (*)    Albumin 2.5 (*)    All other components within normal limits  PROTIME-INR - Abnormal; Notable for the following:    Prothrombin Time 27.2 (*)    INR 2.63 (*)  All other components within normal limits  LIPASE, BLOOD  POCT I-STAT TROPONIN I   Imaging Review Ct Abdomen Pelvis W Contrast  06/30/2013   CLINICAL DATA:  Left upper quadrant abnormality noted on ultrasound at nursing facility. Question abscess.  EXAM: CT ABDOMEN AND PELVIS WITH CONTRAST  TECHNIQUE: Multidetector CT imaging of the abdomen and pelvis was performed using the standard protocol following bolus administration of intravenous contrast.  CONTRAST:  OMNIPAQUE IOHEXOL 300 MG/ML  SOLN  COMPARISON:  01/04/2011 and 12/15/2010 CT  FINDINGS: Abnormality left rectus sheath appears hyperdense and is most suggestive of a hematoma with maximal transverse dimension of 10 x 8 cm and spanning over 17 cm. Rectus sheath hematoma is noted on the prior examination and has changed configuration slightly. It would be helpful to obtain a follow-up examination after rectus sheath hematomas cleared to exclude underlying abnormality.  No abscess or hematoma seen separate from this region.  Inferior vena cava filter is in place.  Atherosclerotic type changes of the aorta branch vessels.  Scarring/atelectasis lung bases.  Heart size within normal limits.  Mild fatty infiltration of the liver with nonspecific 1.5 cm low-density lesion posterior left lobe liver unchanged. No calcified gallstones.  No worrisome splenic, pancreatic, renal or adrenal lesion.  No extra luminal bowel inflammatory process, free fluid or free air.  Non contrast filled imaging of the urinary bladder unremarkable.  Small calcifications prostate gland.  No adenopathy.  Degenerative changes throughout the lower thoracic and lumbar spine. New from the prior examination although appearing remote in origin is a prominent Schmorl's  node deformity of the superior endplate of L4 vertebral body with 50% loss of height centrally.  Heterotopic bone anterior lateral aspect of the right hip.  IMPRESSION: Abnormality left rectus sheath appears hyperdense and is most suggestive of a hematoma with maximal transverse dimension of 10 x 8 cm and spanning over 17 cm.  Please see above discussion for additional nonacute findings.   Electronically Signed   By: Bridgett Larsson M.D.   On: 06/30/2013 12:00    EKG Interpretation   None       MDM   1. Abdominal wall hematoma    EKG normal sinus rhythm, no acute ischemic changes. Troponin negative. Labs a mild leukocytosis at 13.5. CT abdomen revealing large hematoma of left rectus sheath.  On review of medical records and prior imaging, the hematoma has been present for greater than 2+ years. Discussed case with attending, Dr. Romeo Apple, does not feel that further intervention is indicated at this time. Pt will be discharged back to his nursing facility with instructions for repeat scan in 2-3 weeks for continued monitoring.  Discussed this plan of care with pt, he acknowledged understanding and agreed.  Report was called to nursing facility-- they were advised that i offered pain medication but pt declined.  Garlon Hatchet, PA-C 06/30/13 1505

## 2013-06-30 NOTE — ED Notes (Signed)
Rob HickmanSusan Prettyman, [place] [phone] (sister)  Sister called to update on plan of care per patient request.

## 2013-06-30 NOTE — ED Provider Notes (Signed)
Medical screening examination/treatment/procedure(s) were conducted as a shared visit with non-physician practitioner(s) and myself.  I personally evaluated the patient during the encounter.  EKG Interpretation   None       I interviewed and examined the patient. Lungs are CTAB. Cardiac exam wnl. Abdomen soft w/ mild ttp of LUQ. CT shows hematoma of abdominal wall not significantly changed from CT performed 2 years ago. No evidence of acute extravasation. Pt's labs are non-contrib and he continues to appear well. Will recommend routine imaging f/u in the next few weeks.   Junius ArgyleForrest S Avira Tillison, MD 06/30/13 [phone]

## 2013-06-30 NOTE — Discharge Instructions (Signed)
Continue all medications as directed. Follow up with your primary care physician in 2-3 weeks for repeat CT for close monitoring of hematoma. Return to the ED for new or worsening symptoms.

## 2013-06-30 NOTE — ED Notes (Signed)
Beverly notified to call PTAR

## 2013-07-04 DIAGNOSIS — R293 Abnormal posture: Secondary | ICD-10-CM | POA: Diagnosis not present

## 2013-07-04 DIAGNOSIS — I6990 Unspecified sequelae of unspecified cerebrovascular disease: Secondary | ICD-10-CM | POA: Diagnosis not present

## 2013-07-04 DIAGNOSIS — M6281 Muscle weakness (generalized): Secondary | ICD-10-CM | POA: Diagnosis not present

## 2013-07-04 DIAGNOSIS — D649 Anemia, unspecified: Secondary | ICD-10-CM | POA: Diagnosis not present

## 2013-07-05 ENCOUNTER — Non-Acute Institutional Stay (SKILLED_NURSING_FACILITY): Payer: Medicare Other | Admitting: Internal Medicine

## 2013-07-05 ENCOUNTER — Other Ambulatory Visit: Payer: Self-pay | Admitting: Internal Medicine

## 2013-07-05 DIAGNOSIS — R1902 Left upper quadrant abdominal swelling, mass and lump: Secondary | ICD-10-CM | POA: Diagnosis not present

## 2013-07-05 NOTE — Progress Notes (Signed)
Patient ID: James Villegas, male   DOB: [birth year], 58 y.o.   MRN: [id number] [facility] Chief complaint left upper quadrant abdominal hematoma History; James Villegas was sent to the emergency room last week due to a painful swelling in the left upper abdomen. He underwent a CT scan of his abdomen which showed a fairly substantial rectus sheath hematoma. 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 He had been running an INR in the upper 3 range because of these issues. The patient's last hemoglobin was on 1/22 at 12.7 repeated yesterday at 10.3. This would reflect a 2.4 unit loss of blood into this hematoma. I have spoken with the pharmacies to dose his Coumadin the facility with regards to bring the INR down to more of the lower to arrange at least temporarily. We may need to do more than this if the hemoglobin continues to fall  The patient states he feels better there is less pain. He remained systemically well  Physical examination Temperature 90.1 pulse 72 respirations 18 blood pressure 112/70 PO2 was 96% on room air Abdomen there is a large swelling extending medially from the left upper quadrant towards the midline. This is much less tender 1 and then when I saw this 3 days ago. The superficial skin remains poor no other masses.  Impression/plan #1 rectal sheath hematoma [C. CT scan result below] I'm going to continue to monitor the hemoglobin here. Clinically at the bedside the patient appears stable in the area in question appears to be less tender. I have spoken to the pharmacist on reducing his INR is somewhat in the face of the patient with prior problems with thromboembolic disease as well as cerebral emboli.

## 2013-07-06 DIAGNOSIS — D649 Anemia, unspecified: Secondary | ICD-10-CM | POA: Diagnosis not present

## 2013-07-07 ENCOUNTER — Other Ambulatory Visit: Payer: Self-pay | Admitting: *Deleted

## 2013-07-07 DIAGNOSIS — I83229 Varicose veins of left lower extremity with both ulcer of unspecified site and inflammation: Secondary | ICD-10-CM | POA: Diagnosis not present

## 2013-07-07 DIAGNOSIS — I83219 Varicose veins of right lower extremity with both ulcer of unspecified site and inflammation: Secondary | ICD-10-CM | POA: Diagnosis not present

## 2013-07-07 MED ORDER — HYDROCODONE-ACETAMINOPHEN 5-325 MG PO TABS: ORAL_TABLET | ORAL | Status: DC

## 2013-07-11 ENCOUNTER — Telehealth: Payer: Self-pay | Admitting: Hematology and Oncology

## 2013-07-11 NOTE — Telephone Encounter (Signed)
Called pt and left message regarding appt for lab and md on march 2015 r/s to thursday due to md's PAL

## 2013-07-12 ENCOUNTER — Non-Acute Institutional Stay (SKILLED_NURSING_FACILITY): Payer: Medicare Other | Admitting: Internal Medicine

## 2013-07-12 DIAGNOSIS — D7289 Other specified disorders of white blood cells: Secondary | ICD-10-CM | POA: Diagnosis not present

## 2013-07-16 DIAGNOSIS — D7289 Other specified disorders of white blood cells: Secondary | ICD-10-CM | POA: Insufficient documentation

## 2013-07-16 NOTE — Progress Notes (Signed)
         PROGRESS NOTE  DATE: 07/12/2013  FACILITY:  [facility]  LEVEL OF CARE: SNF (31)  Acute Visit  CHIEF COMPLAINT:  Manage leukocytosis  HISTORY OF PRESENT ILLNESS: I was requested by the staff to assess the patient regarding above problem(s):  04-29-14 WBC 15.8, ANC 2.5. Patient denies cough,  shortness of breath or suprapubic pain.  PAST MEDICAL HISTORY : Reviewed.  No changes.  [facility]S: [facility]  PHYSICAL EXAMINATION  GENERAL: no acute distress, morbidly obese body habitus NECK: supple, trachea midline, no neck masses, no thyroid tenderness, no thyromegaly LYMPHATICS: no LAN in the neck, no supraclavicular LAN RESPIRATORY: breathing is even & unlabored, BS CTAB  LABS/RADIOLOGY: See history of present illness  ASSESSMENT/PLAN:  Leukocytosis-patient is asymptomatic. Recheck.  CPT CODE: 1610999307

## 2013-07-19 ENCOUNTER — Non-Acute Institutional Stay (SKILLED_NURSING_FACILITY): Payer: Medicare Other | Admitting: Internal Medicine

## 2013-07-19 DIAGNOSIS — K529 Noninfective gastroenteritis and colitis, unspecified: Secondary | ICD-10-CM

## 2013-07-19 DIAGNOSIS — K5289 Other specified noninfective gastroenteritis and colitis: Secondary | ICD-10-CM

## 2013-07-22 DIAGNOSIS — Z7901 Long term (current) use of anticoagulants: Secondary | ICD-10-CM | POA: Diagnosis not present

## 2013-07-28 DIAGNOSIS — R1902 Left upper quadrant abdominal swelling, mass and lump: Secondary | ICD-10-CM | POA: Insufficient documentation

## 2013-07-28 NOTE — Progress Notes (Signed)
Patient ID: James ForthDavid B Villegas, male   DOB: 12-11-1955, 58 y.o.   MRN: [id number]          PROGRESS NOTE  DATE: 06/28/2013    FACILITY:  [facility]  LEVEL OF CARE: SNF (31)  Acute Visit  CHIEF COMPLAINT:  Manage left upper quadrant mass.     HISTORY OF PRESENT ILLNESS: I was requested by the staff to assess the patient regarding above problem(s):  Patient has been complaining of pains in the left upper quadrant for about a couple of days.  He has also noted a hardness and tenderness in the area.  The patient complains of pleuritic chest pain, as well.  He cannot identify precipitating or alleviating factors.  There is no temporal relationship.  There are no other associated signs and symptoms.    Patient is currently on Coumadin.  INR yesterday was 3.1.  He denies any bleeding.    PAST MEDICAL HISTORY : Reviewed.  No changes.  [facility] MEDICATIONS: [facility]  REVIEW OF SYSTEMS:  GENERAL: no change in appetite, no fatigue, no weight changes, no fever, chills or weakness RESPIRATORY: no cough, SOB, DOE,, wheezing, hemoptysis CARDIAC: no chest pain, edema or palpitations GI: no abdominal pain, diarrhea, constipation, heart burn, nausea or vomiting; left upper quadrant tenderness and mass     PHYSICAL EXAMINATION  GENERAL: no acute distress, morbidly obese body habitus EYES: conjunctivae normal, sclerae normal, normal eye lids NECK: supple, trachea midline, no neck masses, no thyroid tenderness, no thyromegaly LYMPHATICS: no LAN in the neck, no supraclavicular LAN RESPIRATORY: breathing is even & unlabored, BS CTAB CARDIAC: RRR, no murmur,no extra heart sounds EDEMA/VARICOSITIES:  +2 bilateral lower extremity edema     GI: abdomen soft, diminished BS, no hepatomegaly, no splenomegaly; left upper quadrant has a large, tender mass    PSYCHIATRIC: the patient is alert & oriented to person, affect & behavior appropriate  ASSESSMENT/PLAN:  Left upper quadrant  mass.  New onset.  Significant problem.  I suspect a hematoma.  Hold Coumadin.  Check INR stat.  Also, check CBC.  Obtain left upper quadrant ultrasound.    CPT CODE: 4403499309     Angela CoxGayani Y Diego Delancey, MD [facility] [phone]

## 2013-07-29 DIAGNOSIS — K529 Noninfective gastroenteritis and colitis, unspecified: Secondary | ICD-10-CM | POA: Insufficient documentation

## 2013-07-29 NOTE — Progress Notes (Signed)
Patient ID: James Villegas, male   DOB: 04-Dec-1955, 58 y.o.   MRN: [id number]          PROGRESS NOTE  DATE: 07/19/2013    [facility]  LEVEL OF CARE: SNF (31)  Acute Visit  CHIEF COMPLAINT:  Manage nausea, vomiting, and diarrhea.    HISTORY OF PRESENT ILLNESS: I was requested by the staff to assess the patient regarding above problem(s):  Staff report that patient has been having nausea, vomiting, and diarrhea since yesterday.  The patient feels that what he ate for dinner yesterday was bad.  Currently, he is nauseated but denies active vomiting or abdominal pain.  He cannot identify alleviating factors.  There is no temporal relationship.    PAST MEDICAL HISTORY : Reviewed.  No changes.  CURRENT MEDICATIONS: [facility]ed [facility]  REVIEW OF SYSTEMS:  GENERAL: no change in appetite, no fatigue, no weight changes, no fever, chills or weakness RESPIRATORY: no cough, SOB, DOE,, wheezing, hemoptysis CARDIAC: no chest pain, edema or palpitations GI: complains of nausea, vomiting, and diarrhea; no abdominal pain, constipation, or heart burn     PHYSICAL EXAMINATION  VS:  T 99.2      P 84     RR 18     BP 110/70    POX 95%         GENERAL: no acute distress, morbidly obese body habitus NECK: supple, trachea midline, no neck masses, no thyroid tenderness, no thyromegaly RESPIRATORY: breathing is even & unlabored, BS CTAB CARDIAC: RRR, no murmur,no extra heart sounds EDEMA/VARICOSITIES:  +2 bilateral lower extremity edema   GI: abdomen soft, nontender, obese, diminished BS, no masses, no hepatomegaly, no splenomegaly PSYCHIATRIC: the patient is alert & oriented to person, affect & behavior appropriate  ASSESSMENT/PLAN:  Gastroenteritis.  New problem.  Place patient on a clear liquid diet and advance as tolerated.  Use phenergan as p.r.n.    CPT CODE: [id number]     Angela CoxGayani Y Kalep Full, MD [facility] [phone]

## 2013-08-15 DIAGNOSIS — H2510 Age-related nuclear cataract, unspecified eye: Secondary | ICD-10-CM | POA: Diagnosis not present

## 2013-08-15 DIAGNOSIS — Z7901 Long term (current) use of anticoagulants: Secondary | ICD-10-CM | POA: Diagnosis not present

## 2013-08-25 ENCOUNTER — Ambulatory Visit: Payer: Medicare Other | Admitting: Hematology and Oncology

## 2013-08-25 ENCOUNTER — Other Ambulatory Visit: Payer: Medicare Other

## 2013-08-26 ENCOUNTER — Ambulatory Visit: Payer: Medicare Other | Admitting: Hematology and Oncology

## 2013-08-26 ENCOUNTER — Other Ambulatory Visit: Payer: Medicare Other

## 2013-09-28 DIAGNOSIS — L600 Ingrowing nail: Secondary | ICD-10-CM | POA: Diagnosis not present

## 2013-09-28 DIAGNOSIS — I6990 Unspecified sequelae of unspecified cerebrovascular disease: Secondary | ICD-10-CM | POA: Diagnosis not present

## 2013-09-28 DIAGNOSIS — R262 Difficulty in walking, not elsewhere classified: Secondary | ICD-10-CM | POA: Diagnosis not present

## 2013-09-28 DIAGNOSIS — R293 Abnormal posture: Secondary | ICD-10-CM | POA: Diagnosis not present

## 2013-09-29 DIAGNOSIS — R293 Abnormal posture: Secondary | ICD-10-CM | POA: Diagnosis not present

## 2013-09-29 DIAGNOSIS — R262 Difficulty in walking, not elsewhere classified: Secondary | ICD-10-CM | POA: Diagnosis not present

## 2013-09-29 DIAGNOSIS — I6990 Unspecified sequelae of unspecified cerebrovascular disease: Secondary | ICD-10-CM | POA: Diagnosis not present

## 2013-09-29 DIAGNOSIS — L600 Ingrowing nail: Secondary | ICD-10-CM | POA: Diagnosis not present

## 2013-09-30 DIAGNOSIS — I6990 Unspecified sequelae of unspecified cerebrovascular disease: Secondary | ICD-10-CM | POA: Diagnosis not present

## 2013-09-30 DIAGNOSIS — R293 Abnormal posture: Secondary | ICD-10-CM | POA: Diagnosis not present

## 2013-09-30 DIAGNOSIS — L600 Ingrowing nail: Secondary | ICD-10-CM | POA: Diagnosis not present

## 2013-09-30 DIAGNOSIS — R262 Difficulty in walking, not elsewhere classified: Secondary | ICD-10-CM | POA: Diagnosis not present

## 2013-10-03 DIAGNOSIS — I6990 Unspecified sequelae of unspecified cerebrovascular disease: Secondary | ICD-10-CM | POA: Diagnosis not present

## 2013-10-03 DIAGNOSIS — R262 Difficulty in walking, not elsewhere classified: Secondary | ICD-10-CM | POA: Diagnosis not present

## 2013-10-03 DIAGNOSIS — R293 Abnormal posture: Secondary | ICD-10-CM | POA: Diagnosis not present

## 2013-10-03 DIAGNOSIS — L600 Ingrowing nail: Secondary | ICD-10-CM | POA: Diagnosis not present

## 2013-10-04 DIAGNOSIS — I6990 Unspecified sequelae of unspecified cerebrovascular disease: Secondary | ICD-10-CM | POA: Diagnosis not present

## 2013-10-04 DIAGNOSIS — R293 Abnormal posture: Secondary | ICD-10-CM | POA: Diagnosis not present

## 2013-10-04 DIAGNOSIS — R262 Difficulty in walking, not elsewhere classified: Secondary | ICD-10-CM | POA: Diagnosis not present

## 2013-10-04 DIAGNOSIS — L600 Ingrowing nail: Secondary | ICD-10-CM | POA: Diagnosis not present

## 2013-10-05 DIAGNOSIS — L600 Ingrowing nail: Secondary | ICD-10-CM | POA: Diagnosis not present

## 2013-10-05 DIAGNOSIS — R262 Difficulty in walking, not elsewhere classified: Secondary | ICD-10-CM | POA: Diagnosis not present

## 2013-10-05 DIAGNOSIS — R293 Abnormal posture: Secondary | ICD-10-CM | POA: Diagnosis not present

## 2013-10-05 DIAGNOSIS — I6990 Unspecified sequelae of unspecified cerebrovascular disease: Secondary | ICD-10-CM | POA: Diagnosis not present

## 2013-10-06 DIAGNOSIS — R293 Abnormal posture: Secondary | ICD-10-CM | POA: Diagnosis not present

## 2013-10-06 DIAGNOSIS — R262 Difficulty in walking, not elsewhere classified: Secondary | ICD-10-CM | POA: Diagnosis not present

## 2013-10-06 DIAGNOSIS — L600 Ingrowing nail: Secondary | ICD-10-CM | POA: Diagnosis not present

## 2013-10-06 DIAGNOSIS — I6990 Unspecified sequelae of unspecified cerebrovascular disease: Secondary | ICD-10-CM | POA: Diagnosis not present

## 2013-10-07 DIAGNOSIS — R262 Difficulty in walking, not elsewhere classified: Secondary | ICD-10-CM | POA: Diagnosis not present

## 2013-10-07 DIAGNOSIS — L600 Ingrowing nail: Secondary | ICD-10-CM | POA: Diagnosis not present

## 2013-10-07 DIAGNOSIS — I6990 Unspecified sequelae of unspecified cerebrovascular disease: Secondary | ICD-10-CM | POA: Diagnosis not present

## 2013-10-07 DIAGNOSIS — R293 Abnormal posture: Secondary | ICD-10-CM | POA: Diagnosis not present

## 2013-10-10 DIAGNOSIS — R262 Difficulty in walking, not elsewhere classified: Secondary | ICD-10-CM | POA: Diagnosis not present

## 2013-10-10 DIAGNOSIS — I6990 Unspecified sequelae of unspecified cerebrovascular disease: Secondary | ICD-10-CM | POA: Diagnosis not present

## 2013-10-10 DIAGNOSIS — L600 Ingrowing nail: Secondary | ICD-10-CM | POA: Diagnosis not present

## 2013-10-10 DIAGNOSIS — R293 Abnormal posture: Secondary | ICD-10-CM | POA: Diagnosis not present

## 2013-10-11 DIAGNOSIS — R262 Difficulty in walking, not elsewhere classified: Secondary | ICD-10-CM | POA: Diagnosis not present

## 2013-10-11 DIAGNOSIS — R293 Abnormal posture: Secondary | ICD-10-CM | POA: Diagnosis not present

## 2013-10-11 DIAGNOSIS — L600 Ingrowing nail: Secondary | ICD-10-CM | POA: Diagnosis not present

## 2013-10-11 DIAGNOSIS — I6990 Unspecified sequelae of unspecified cerebrovascular disease: Secondary | ICD-10-CM | POA: Diagnosis not present

## 2013-10-13 DIAGNOSIS — I6990 Unspecified sequelae of unspecified cerebrovascular disease: Secondary | ICD-10-CM | POA: Diagnosis not present

## 2013-10-13 DIAGNOSIS — R262 Difficulty in walking, not elsewhere classified: Secondary | ICD-10-CM | POA: Diagnosis not present

## 2013-10-13 DIAGNOSIS — L600 Ingrowing nail: Secondary | ICD-10-CM | POA: Diagnosis not present

## 2013-10-13 DIAGNOSIS — R293 Abnormal posture: Secondary | ICD-10-CM | POA: Diagnosis not present

## 2013-10-14 DIAGNOSIS — R262 Difficulty in walking, not elsewhere classified: Secondary | ICD-10-CM | POA: Diagnosis not present

## 2013-10-14 DIAGNOSIS — R293 Abnormal posture: Secondary | ICD-10-CM | POA: Diagnosis not present

## 2013-10-14 DIAGNOSIS — L600 Ingrowing nail: Secondary | ICD-10-CM | POA: Diagnosis not present

## 2013-10-14 DIAGNOSIS — I6990 Unspecified sequelae of unspecified cerebrovascular disease: Secondary | ICD-10-CM | POA: Diagnosis not present

## 2013-10-17 DIAGNOSIS — R293 Abnormal posture: Secondary | ICD-10-CM | POA: Diagnosis not present

## 2013-10-17 DIAGNOSIS — I6990 Unspecified sequelae of unspecified cerebrovascular disease: Secondary | ICD-10-CM | POA: Diagnosis not present

## 2013-10-17 DIAGNOSIS — L600 Ingrowing nail: Secondary | ICD-10-CM | POA: Diagnosis not present

## 2013-10-17 DIAGNOSIS — R262 Difficulty in walking, not elsewhere classified: Secondary | ICD-10-CM | POA: Diagnosis not present

## 2013-10-19 ENCOUNTER — Non-Acute Institutional Stay (SKILLED_NURSING_FACILITY): Payer: Medicare Other | Admitting: Internal Medicine

## 2013-10-19 DIAGNOSIS — I10 Essential (primary) hypertension: Secondary | ICD-10-CM

## 2013-10-19 DIAGNOSIS — R262 Difficulty in walking, not elsewhere classified: Secondary | ICD-10-CM | POA: Diagnosis not present

## 2013-10-19 DIAGNOSIS — R569 Unspecified convulsions: Secondary | ICD-10-CM

## 2013-10-19 DIAGNOSIS — I6990 Unspecified sequelae of unspecified cerebrovascular disease: Secondary | ICD-10-CM

## 2013-10-19 DIAGNOSIS — E7800 Pure hypercholesterolemia, unspecified: Secondary | ICD-10-CM | POA: Diagnosis not present

## 2013-10-19 DIAGNOSIS — L600 Ingrowing nail: Secondary | ICD-10-CM | POA: Diagnosis not present

## 2013-10-19 DIAGNOSIS — R293 Abnormal posture: Secondary | ICD-10-CM | POA: Diagnosis not present

## 2013-10-19 NOTE — Progress Notes (Signed)
           PROGRESS NOTE  DATE: 10-19-13  FACILITY: [facility]  LEVEL OF CARE: SNF  Routine Visit  CHIEF COMPLAINT:  Manage CVA, seizure disorder and hyperlipidemia  HISTORY OF PRESENT ILLNESS:  REASSESSMENT OF ONGOING PROBLEM(S):  CVA: The patient's CVA remains stable.  Patient denies new neurologic symptoms such as numbness, tingling, weakness, speech difficulties or visual disturbances.  No complications reported from the medications currently being used. He has right-sided hemiparesis.  HYPERLIPIDEMIA: No complications from the medications presently being used. Last fasting lipid panel showed : LDL 120 otherwise Fasting lipid panel normal in 12/13, in 6/14 triglycerides 188, HDL 38 otherwise fasting lipid panel normal, and 12-14 triglycerides 283, LDL 101, total cholesterol 198, HDL 40  SEIZURE DISORDER: The patient's seizure disorder remains stable. No complications reported from the medications presently being used. Staff do not report any recent seizure activity.  PAST MEDICAL HISTORY : Reviewed.  No changes.  CURRENT MEDICATIONS: [facility] per [facility]  REVIEW OF SYSTEMS:  GENERAL: no change in appetite, no fatigue, no weight changes, no fever, chills or weakness RESPIRATORY: no cough, SOB, DOE, wheezing, hemoptysis CARDIAC: no chest pain, or palpitations, complains of lower extremity swelling GI: no abdominal pain, diarrhea, constipation, heart burn, nausea or vomiting  PHYSICAL EXAMINATION  VS:  See vital signs section  GENERAL: no acute distress, morbidly obese body habitus EYES: Normal sclerae, normal conjunctivae, no discharge NECK: supple, trachea midline, no neck masses, no thyroid tenderness, no thyromegaly LYMPHATICS: No cervical lymphadenopathy, no supraclavicular lymphadenopathy RESPIRATORY: breathing is even & unlabored, BS CTAB CARDIAC: RRR, no murmur,no extra heart sounds, +2 bilateral lower extremity edema GI: abdomen soft, normal BS, no masses, no  tenderness, no hepatomegaly, no splenomegaly PSYCHIATRIC: the patient is alert & oriented to person, affect & behavior appropriate SKIN: Right great toe has purulent drainage and an ingrown toenail  LABS/RADIOLOGY: 1-15 hemoglobin 11.8, MCV 99, platelets 441 otherwise CBC normal, total protein 5.9 otherwise liver profile normal 12-14 liver profile normal, hemoglobin A1c 6, CBC normal, total protein 5.4, alkaline phosphatase 138 otherwise CMP normal, Dilantin level 12  6/14 CBC normal, total protein 5.3, albumin 3.4 otherwise CMP normal, Dilantin level 12.1 12/13 CBC normal, total protein 5.4 otherwise CMP normal, Dilantin 9 11/13 TSH 2.6  ASSESSMENT/PLAN:  CVA-continue supportive care. hyperlipidemia -- on maximum dose of Crestor.  hypertension-well-controlled. peripheral neuropathy-stable. DVT-continue Coumadin. seizure disorder-well controlled. Check Dilantin level  CPT CODE: [id number]  Newton PiggGayani Y. Kerry Doryasanayaka, MD [facility] [phone]

## 2013-10-20 DIAGNOSIS — R569 Unspecified convulsions: Secondary | ICD-10-CM | POA: Diagnosis not present

## 2013-10-20 DIAGNOSIS — L600 Ingrowing nail: Secondary | ICD-10-CM | POA: Diagnosis not present

## 2013-10-20 DIAGNOSIS — I6990 Unspecified sequelae of unspecified cerebrovascular disease: Secondary | ICD-10-CM | POA: Diagnosis not present

## 2013-10-20 DIAGNOSIS — R293 Abnormal posture: Secondary | ICD-10-CM | POA: Diagnosis not present

## 2013-10-20 DIAGNOSIS — R262 Difficulty in walking, not elsewhere classified: Secondary | ICD-10-CM | POA: Diagnosis not present

## 2013-10-21 DIAGNOSIS — I6990 Unspecified sequelae of unspecified cerebrovascular disease: Secondary | ICD-10-CM | POA: Diagnosis not present

## 2013-10-21 DIAGNOSIS — R262 Difficulty in walking, not elsewhere classified: Secondary | ICD-10-CM | POA: Diagnosis not present

## 2013-10-21 DIAGNOSIS — R293 Abnormal posture: Secondary | ICD-10-CM | POA: Diagnosis not present

## 2013-10-21 DIAGNOSIS — L600 Ingrowing nail: Secondary | ICD-10-CM | POA: Diagnosis not present

## 2013-10-24 DIAGNOSIS — R293 Abnormal posture: Secondary | ICD-10-CM | POA: Diagnosis not present

## 2013-10-24 DIAGNOSIS — L600 Ingrowing nail: Secondary | ICD-10-CM | POA: Diagnosis not present

## 2013-10-24 DIAGNOSIS — I6990 Unspecified sequelae of unspecified cerebrovascular disease: Secondary | ICD-10-CM | POA: Diagnosis not present

## 2013-10-24 DIAGNOSIS — R262 Difficulty in walking, not elsewhere classified: Secondary | ICD-10-CM | POA: Diagnosis not present

## 2013-10-25 DIAGNOSIS — R293 Abnormal posture: Secondary | ICD-10-CM | POA: Diagnosis not present

## 2013-10-25 DIAGNOSIS — I6990 Unspecified sequelae of unspecified cerebrovascular disease: Secondary | ICD-10-CM | POA: Diagnosis not present

## 2013-10-25 DIAGNOSIS — R262 Difficulty in walking, not elsewhere classified: Secondary | ICD-10-CM | POA: Diagnosis not present

## 2013-10-25 DIAGNOSIS — L600 Ingrowing nail: Secondary | ICD-10-CM | POA: Diagnosis not present

## 2013-10-26 DIAGNOSIS — L600 Ingrowing nail: Secondary | ICD-10-CM | POA: Diagnosis not present

## 2013-10-26 DIAGNOSIS — I6990 Unspecified sequelae of unspecified cerebrovascular disease: Secondary | ICD-10-CM | POA: Diagnosis not present

## 2013-10-26 DIAGNOSIS — R293 Abnormal posture: Secondary | ICD-10-CM | POA: Diagnosis not present

## 2013-10-26 DIAGNOSIS — R262 Difficulty in walking, not elsewhere classified: Secondary | ICD-10-CM | POA: Diagnosis not present

## 2013-10-27 DIAGNOSIS — R262 Difficulty in walking, not elsewhere classified: Secondary | ICD-10-CM | POA: Diagnosis not present

## 2013-10-27 DIAGNOSIS — R293 Abnormal posture: Secondary | ICD-10-CM | POA: Diagnosis not present

## 2013-10-27 DIAGNOSIS — L600 Ingrowing nail: Secondary | ICD-10-CM | POA: Diagnosis not present

## 2013-10-27 DIAGNOSIS — I6990 Unspecified sequelae of unspecified cerebrovascular disease: Secondary | ICD-10-CM | POA: Diagnosis not present

## 2013-10-28 DIAGNOSIS — I6990 Unspecified sequelae of unspecified cerebrovascular disease: Secondary | ICD-10-CM | POA: Diagnosis not present

## 2013-10-28 DIAGNOSIS — R262 Difficulty in walking, not elsewhere classified: Secondary | ICD-10-CM | POA: Diagnosis not present

## 2013-10-28 DIAGNOSIS — R293 Abnormal posture: Secondary | ICD-10-CM | POA: Diagnosis not present

## 2013-10-28 DIAGNOSIS — L600 Ingrowing nail: Secondary | ICD-10-CM | POA: Diagnosis not present

## 2013-11-09 ENCOUNTER — Non-Acute Institutional Stay (SKILLED_NURSING_FACILITY): Payer: Medicare Other | Admitting: Internal Medicine

## 2013-11-09 DIAGNOSIS — M25579 Pain in unspecified ankle and joints of unspecified foot: Secondary | ICD-10-CM | POA: Diagnosis not present

## 2013-11-09 DIAGNOSIS — S9030XA Contusion of unspecified foot, initial encounter: Secondary | ICD-10-CM | POA: Diagnosis not present

## 2013-11-13 NOTE — Progress Notes (Signed)
Patient ID: James Villegas, male   DOB: 23-Jan-1956, 58 y.o.   MRN: [id number]            PROGRESS NOTE  DATE: 11/09/2013       FACILITY:  [facility]  LEVEL OF CARE: SNF (31)  Acute Visit  CHIEF COMPLAINT:  Manage right foot pain.    HISTORY OF PRESENT ILLNESS: I was requested by the staff to assess the patient regarding above problem(s):  Staff report that patient has been complaining of pain and swelling in right forefoot on the medial side.  Per patient, he had hit his foot on the wall yesterday.    PAST MEDICAL HISTORY : Reviewed.  No changes/see problem list  CURRENT MEDICATIONS: Reviewed per MAR/see medication list  REVIEW OF SYSTEMS:  GENERAL: no change in appetite, no fatigue, no weight changes, no fever, chills or weakness RESPIRATORY: no cough, SOB, DOE,, wheezing, hemoptysis CARDIAC: no chest pain, edema or palpitations GI: no abdominal pain, diarrhea, constipation, heart burn, nausea or vomiting  PHYSICAL EXAMINATION  VS:  T 96.6       P 92      RR 20      BP 156/76   POX 97%       WT (Lb) 289            GENERAL: no acute distress, normal body habitus NECK: supple, trachea midline, no neck masses, no thyroid tenderness, no thyromegaly RESPIRATORY: breathing is even & unlabored, BS CTAB CARDIAC: RRR, no murmur,no extra heart sounds, +2 bilateral lower extremity edema      GI: abdomen soft, normal BS, no masses, no tenderness, no hepatomegaly, no splenomegaly PSYCHIATRIC: the patient is alert & oriented to person, affect & behavior appropriate MUSCULOSKELETAL:   EXTREMITIES:    RIGHT LOWER EXTREMITY:   Right forefoot edematous, exquisitely tender to palpation, and bluish discoloration present      ASSESSMENT/PLAN:  Right foot pain.  New problem.  Question whether he has an underlying fracture.  Therefore, we will obtain x-ray.    CPT CODE: 35670        Angela Cox, MD [facility] [phone]

## 2013-11-14 ENCOUNTER — Non-Acute Institutional Stay (SKILLED_NURSING_FACILITY): Payer: Medicare Other | Admitting: Internal Medicine

## 2013-11-14 DIAGNOSIS — I6990 Unspecified sequelae of unspecified cerebrovascular disease: Secondary | ICD-10-CM | POA: Diagnosis not present

## 2013-11-14 DIAGNOSIS — I10 Essential (primary) hypertension: Secondary | ICD-10-CM | POA: Diagnosis not present

## 2013-11-14 DIAGNOSIS — M25579 Pain in unspecified ankle and joints of unspecified foot: Secondary | ICD-10-CM | POA: Insufficient documentation

## 2013-11-14 DIAGNOSIS — E7800 Pure hypercholesterolemia, unspecified: Secondary | ICD-10-CM

## 2013-11-14 DIAGNOSIS — R569 Unspecified convulsions: Secondary | ICD-10-CM

## 2013-11-14 NOTE — Progress Notes (Signed)
            PROGRESS NOTE  DATE: 11-14-13  FACILITY: [facility]  LEVEL OF CARE: SNF  Routine Visit  CHIEF COMPLAINT:  Manage CVA, seizure disorder and hyperlipidemia  HISTORY OF PRESENT ILLNESS:  REASSESSMENT OF ONGOING PROBLEM(S):  CVA: The patient's CVA remains stable.  Patient denies new neurologic symptoms such as numbness, tingling, weakness, speech difficulties or visual disturbances.  No complications reported from the medications currently being used. He has right-sided hemiparesis.  HYPERLIPIDEMIA: No complications from the medications presently being used. Last fasting lipid panel showed : LDL 120 otherwise Fasting lipid panel normal in 12/13, in 6/14 triglycerides 188, HDL 38 otherwise fasting lipid panel normal, and 12-14 triglycerides 283, LDL 101, total cholesterol 198, HDL 40  SEIZURE DISORDER: The patient's seizure disorder remains stable. No complications reported from the medications presently being used. Staff do not report any recent seizure activity.  PAST MEDICAL HISTORY : Reviewed.  No changes.  CURRENT MEDICATIONS: Reviewed per [facility]  REVIEW OF SYSTEMS:  GENERAL: no change in appetite, no fatigue, no weight changes, no fever, chills or weakness RESPIRATORY: no cough, SOB, DOE, wheezing, hemoptysis CARDIAC: no chest pain, or palpitations, complains of lower extremity swelling GI: no abdominal pain, diarrhea, constipation, heart burn, nausea or vomiting  PHYSICAL EXAMINATION  VS:  See vital signs section  GENERAL: no acute distress, morbidly obese body habitus NECK: supple, trachea midline, no neck masses, no thyroid tenderness, no thyromegaly RESPIRATORY: breathing is even & unlabored, BS CTAB CARDIAC: RRR, no murmur,no extra heart sounds, +2 right lower extremity edema, +3 left lower extremity edema GI: abdomen soft, normal BS, no masses, no tenderness, no hepatomegaly, no splenomegaly PSYCHIATRIC: the patient is alert & oriented to person, affect &  behavior appropriate SKIN: Right great toe has purulent drainage and an ingrown toenail  LABS/RADIOLOGY: 5-15 Dilantin level 10.3 1-15 hemoglobin 11.8, MCV 99, platelets 441 otherwise CBC normal, total protein 5.9 otherwise liver profile normal 12-14 liver profile normal, hemoglobin A1c 6, CBC normal, total protein 5.4, alkaline phosphatase 138 otherwise CMP normal, Dilantin level 12  6/14 CBC normal, total protein 5.3, albumin 3.4 otherwise CMP normal, Dilantin level 12.1 12/13 CBC normal, total protein 5.4 otherwise CMP normal, Dilantin 9 11/13 TSH 2.6  ASSESSMENT/PLAN:  CVA-continue supportive care. hyperlipidemia -- on maximum dose of Crestor.  hypertension-blood pressure elevated. will review a log. peripheral neuropathy-stable. DVT-continue Coumadin. seizure disorder-well controlled. Check BMP  CPT CODE: 16109  Newton Pigg. Kerry Dory, MD [facility] [phone]

## 2013-11-17 DIAGNOSIS — Z79899 Other long term (current) drug therapy: Secondary | ICD-10-CM | POA: Diagnosis not present

## 2013-11-17 DIAGNOSIS — D649 Anemia, unspecified: Secondary | ICD-10-CM | POA: Diagnosis not present

## 2013-11-17 DIAGNOSIS — E7800 Pure hypercholesterolemia, unspecified: Secondary | ICD-10-CM | POA: Diagnosis not present

## 2013-11-17 DIAGNOSIS — R569 Unspecified convulsions: Secondary | ICD-10-CM | POA: Diagnosis not present

## 2013-11-21 ENCOUNTER — Non-Acute Institutional Stay (SKILLED_NURSING_FACILITY): Payer: Medicare Other | Admitting: Internal Medicine

## 2013-11-21 DIAGNOSIS — D638 Anemia in other chronic diseases classified elsewhere: Secondary | ICD-10-CM | POA: Diagnosis not present

## 2013-11-21 DIAGNOSIS — M25579 Pain in unspecified ankle and joints of unspecified foot: Secondary | ICD-10-CM | POA: Diagnosis not present

## 2013-11-21 DIAGNOSIS — E7800 Pure hypercholesterolemia, unspecified: Secondary | ICD-10-CM | POA: Diagnosis not present

## 2013-11-23 DIAGNOSIS — D638 Anemia in other chronic diseases classified elsewhere: Secondary | ICD-10-CM | POA: Insufficient documentation

## 2013-11-23 NOTE — Progress Notes (Signed)
Patient ID: James ForthDavid B Villegas, male   DOB: July 01, 1955, 58 y.o.   MRN: [id number]            PROGRESS NOTE  DATE: 11/21/2013        [facility]  LEVEL OF CARE: SNF (31)  Acute Visit  CHIEF COMPLAINT:  Manage right foot pain, anemia of chronic disease, and hyperlipidemia.    HISTORY OF PRESENT ILLNESS: I was requested by the staff to assess the patient regarding above problem(s):  RIGHT FOOT PAIN:  Patient complains of ongoing right foot pain and he feels like the pain is not getting better.  X-ray of the foot was negative for fracture.    ANEMIA: The anemia is unstable. The patient denies fatigue, melena or hematochezia. No complications from the medications currently being used.   On 11/17/2013:  Hemoglobin 12, MCV 87.  In 06/2013:  Hemoglobin 12.7.    HYPERLIPIDEMIA: No complications from the medications presently being used. Last fasting lipid panel showed :   On 11/17/2013:  LDL 122, otherwise lipid panel normal.  In 05/2013:  LDL 101, otherwise lipid panel normal.     PAST MEDICAL HISTORY : Reviewed.  No changes/see problem list  CURRENT MEDICATIONS: Reviewed per MAR/see medication list  REVIEW OF SYSTEMS:  GENERAL: no change in appetite, no fatigue, no weight changes, no fever, chills or weakness RESPIRATORY: no cough, SOB, DOE,, wheezing, hemoptysis CARDIAC: no chest pain or palpitations; complains of lower extremity swelling     GI: no abdominal pain, diarrhea, constipation, heart burn, nausea or vomiting  PHYSICAL EXAMINATION  VS:  T 96.6       P 72      RR 20      BP 156/76     POX 97%       WT (Lb) 289       GENERAL: no acute distress, morbidly obese body habitus EYES: conjunctivae normal, sclerae normal, normal eye lids NECK: supple, trachea midline, no neck masses, no thyroid tenderness, no thyromegaly LYMPHATICS: no LAN in the neck, no supraclavicular LAN RESPIRATORY: breathing is even & unlabored, BS CTAB CARDIAC: RRR, no murmur,no  extra heart sounds, +2 bilateral lower extremity edema    GI: abdomen soft, normal BS, no masses, no tenderness, no hepatomegaly, no splenomegaly PSYCHIATRIC: the patient is alert & oriented to person, affect & behavior appropriate MUSCULOSKELETAL:   EXTREMITIES:   RIGHT LOWER EXTREMITY:  Right foot is tender to palpation.  The area has bluish discoloration at the sole in the metatarsal area.  Right foot is edematous.  No erythema or warmth.    ASSESSMENT/PLAN:  Right foot pain.  Likely a sprain.  Start Naprosyn 500 mg b.i.d. for 10 days.  Apply ice pack 15 minutes three times a day for one week.    Anemia.  New problem.  Hemoglobin declined.  We will monitor.    Hyperlipidemia.  LDL elevated.  Patient has a history of CVA.  Therefore, not at goal; but he is on Zetia 10 mg q.d. and Crestor 40 mg q.d.   Therefore, we will monitor.    CPT CODE: 1191499309         Angela CoxGayani Y Dasanayaka, MD [facility] [phone]

## 2013-12-26 ENCOUNTER — Non-Acute Institutional Stay (SKILLED_NURSING_FACILITY): Payer: Medicare Other | Admitting: Internal Medicine

## 2013-12-26 DIAGNOSIS — E7800 Pure hypercholesterolemia, unspecified: Secondary | ICD-10-CM | POA: Diagnosis not present

## 2013-12-26 DIAGNOSIS — R569 Unspecified convulsions: Secondary | ICD-10-CM | POA: Diagnosis not present

## 2013-12-26 DIAGNOSIS — G609 Hereditary and idiopathic neuropathy, unspecified: Secondary | ICD-10-CM | POA: Diagnosis not present

## 2013-12-26 DIAGNOSIS — I6990 Unspecified sequelae of unspecified cerebrovascular disease: Secondary | ICD-10-CM

## 2013-12-26 NOTE — Progress Notes (Signed)
          PROGRESS NOTE  DATE: 12-26-13  FACILITY: [facility]  LEVEL OF CARE: SNF  Routine Visit  CHIEF COMPLAINT:  Manage CVA, seizure disorder and hyperlipidemia  HISTORY OF PRESENT ILLNESS:  REASSESSMENT OF ONGOING PROBLEM(S):  CVA: The patient's CVA remains stable.  Patient denies new neurologic symptoms such as numbness, tingling, weakness, speech difficulties or visual disturbances.  No complications reported from the medications currently being used. He has right-sided hemiparesis.  HYPERLIPIDEMIA: No complications from the medications presently being used. Last fasting lipid panel showed : LDL 120 otherwise Fasting lipid panel normal in 12/13, in 6/14 triglycerides 188, HDL 38 otherwise fasting lipid panel normal, and 12-14 triglycerides 283, LDL 101, total cholesterol 198, HDL 40, in 6-15 LDL 122 otherwise fasting lipid panel normal  SEIZURE DISORDER: The patient's seizure disorder remains stable. No complications reported from the medications presently being used. Staff do not report any recent seizure activity.  PAST MEDICAL HISTORY : Reviewed.  No changes.  CURRENT MEDICATIONS: [facility]  REVIEW OF SYSTEMS:  GENERAL: no change in appetite, no fatigue, no weight changes, no fever, chills or weakness RESPIRATORY: no cough, SOB, DOE, wheezing, hemoptysis CARDIAC: no chest pain, or palpitations, complains of lower extremity swelling GI: no abdominal pain, diarrhea, constipation, heart burn, nausea or vomiting  PHYSICAL EXAMINATION  VS:  See vital signs section  GENERAL: no acute distress, morbidly obese body habitus NECK: supple, trachea midline, no neck masses, no thyroid tenderness, no thyromegaly RESPIRATORY: breathing is even & unlabored, BS CTAB CARDIAC: RRR, no murmur,no extra heart sounds, +2 right lower extremity edema, +3 left lower extremity edema GI: abdomen soft, normal BS, no masses, no tenderness, no hepatomegaly, no splenomegaly PSYCHIATRIC:  the patient is alert & oriented to person, affect & behavior appropriate SKIN: Right great toe has purulent drainage and an ingrown toenail  LABS/RADIOLOGY: 6-15 hemoglobin 12, MCV 87 otherwise CBC normal, total protein 5.5 otherwise CMP normal, Dilantin level 9.1 5-15 Dilantin level 10.3 1-15 hemoglobin 11.8, MCV 99, platelets 441 otherwise CBC normal, total protein 5.9 otherwise liver profile normal 12-14 liver profile normal, hemoglobin A1c 6, CBC normal, total protein 5.4, alkaline phosphatase 138 otherwise CMP normal, Dilantin level 12  6/14 CBC normal, total protein 5.3, albumin 3.4 otherwise CMP normal, Dilantin level 12.1 12/13 CBC normal, total protein 5.4 otherwise CMP normal, Dilantin 9 11/13 TSH 2.6  ASSESSMENT/PLAN:  CVA-continue supportive care. hyperlipidemia -- on maximum dose of Crestor.  hypertension-well controlled peripheral neuropathy-stable. DVT-continue Coumadin. seizure disorder-well controlled  CPT CODE: 1610999308  Newton PiggGayani Y. Kerry Doryasanayaka, MD [facility] [phone]

## 2014-01-25 ENCOUNTER — Non-Acute Institutional Stay (SKILLED_NURSING_FACILITY): Payer: Medicare Other | Admitting: Internal Medicine

## 2014-01-25 DIAGNOSIS — I10 Essential (primary) hypertension: Secondary | ICD-10-CM | POA: Diagnosis not present

## 2014-01-25 DIAGNOSIS — I6990 Unspecified sequelae of unspecified cerebrovascular disease: Secondary | ICD-10-CM | POA: Diagnosis not present

## 2014-01-25 DIAGNOSIS — R569 Unspecified convulsions: Secondary | ICD-10-CM

## 2014-01-25 DIAGNOSIS — E7800 Pure hypercholesterolemia, unspecified: Secondary | ICD-10-CM | POA: Diagnosis not present

## 2014-01-27 NOTE — Progress Notes (Signed)
          PROGRESS NOTE  DATE: 01-25-14  FACILITY: [facility]  LEVEL OF CARE: SNF  Routine Visit  CHIEF COMPLAINT:  Manage CVA, seizure disorder and hyperlipidemia  HISTORY OF PRESENT ILLNESS:  REASSESSMENT OF ONGOING PROBLEM(S):  CVA: The patient's CVA remains stable.  Patient denies new neurologic symptoms such as numbness, tingling, weakness, speech difficulties or visual disturbances.  No complications reported from the medications currently being used. He has right-sided hemiparesis.  HYPERLIPIDEMIA: No complications from the medications presently being used. Last fasting lipid panel showed : LDL 120 otherwise Fasting lipid panel normal in 12/13, in 6/14 triglycerides 188, HDL 38 otherwise fasting lipid panel normal, and 12-14 triglycerides 283, LDL 101, total cholesterol 198, HDL 40, in 6-15 LDL 122 otherwise fasting lipid panel normal  SEIZURE DISORDER: The patient's seizure disorder remains stable. No complications reported from the medications presently being used. Staff do not report any recent seizure activity.  PAST MEDICAL HISTORY : Reviewed.  No changes.  [facility]  REVIEW OF SYSTEMS:  GENERAL: no change in appetite, no fatigue, no weight changes, no fever, chills or weakness RESPIRATORY: no cough, SOB, DOE, wheezing, hemoptysis CARDIAC: no chest pain, or palpitations, complains of lower extremity swelling GI: no abdominal pain, diarrhea, constipation, heart burn, nausea or vomiting  PHYSICAL EXAMINATION  VS:  See vital signs section  GENERAL: no acute distress, morbidly obese body habitus NECK: supple, trachea midline, no neck masses, no thyroid tenderness, no thyromegaly RESPIRATORY: breathing is even & unlabored, BS CTAB CARDIAC: RRR, no murmur,no extra heart sounds, +2 right lower extremity edema, +3 left lower extremity edema GI: abdomen soft, normal BS, no masses, no tenderness, no hepatomegaly, no splenomegaly PSYCHIATRIC:  the patient is alert & oriented to person, affect & behavior appropriate SKIN: Right great toe has purulent drainage and an ingrown toenail  LABS/RADIOLOGY: 6-15 hemoglobin 12, MCV 87 otherwise CBC normal, total protein 5.5 otherwise CMP normal, Dilantin level 9.1 5-15 Dilantin level 10.3 1-15 hemoglobin 11.8, MCV 99, platelets 441 otherwise CBC normal, total protein 5.9 otherwise liver profile normal 12-14 liver profile normal, hemoglobin A1c 6, CBC normal, total protein 5.4, alkaline phosphatase 138 otherwise CMP normal, Dilantin level 12  6/14 CBC normal, total protein 5.3, albumin 3.4 otherwise CMP normal, Dilantin level 12.1 12/13 CBC normal, total protein 5.4 otherwise CMP normal, Dilantin 9 11/13 TSH 2.6  ASSESSMENT/PLAN:  CVA-continue supportive care. hyperlipidemia -- on maximum dose of Crestor.  hypertension-well controlled peripheral neuropathy-stable. DVT-continue Coumadin. seizure disorder-well controlled  CPT CODE: 1610999308  Newton PiggGayani Y. Kerry Doryasanayaka, MD [facility] [phone]

## 2014-01-31 DIAGNOSIS — R1311 Dysphagia, oral phase: Secondary | ICD-10-CM | POA: Diagnosis not present

## 2014-01-31 DIAGNOSIS — I6990 Unspecified sequelae of unspecified cerebrovascular disease: Secondary | ICD-10-CM | POA: Diagnosis not present

## 2014-01-31 DIAGNOSIS — R293 Abnormal posture: Secondary | ICD-10-CM | POA: Diagnosis not present

## 2014-02-01 DIAGNOSIS — R293 Abnormal posture: Secondary | ICD-10-CM | POA: Diagnosis not present

## 2014-02-01 DIAGNOSIS — I6990 Unspecified sequelae of unspecified cerebrovascular disease: Secondary | ICD-10-CM | POA: Diagnosis not present

## 2014-02-01 DIAGNOSIS — R1311 Dysphagia, oral phase: Secondary | ICD-10-CM | POA: Diagnosis not present

## 2014-02-14 DIAGNOSIS — F985 Adult onset fluency disorder: Secondary | ICD-10-CM | POA: Diagnosis not present

## 2014-02-14 DIAGNOSIS — M6281 Muscle weakness (generalized): Secondary | ICD-10-CM | POA: Diagnosis not present

## 2014-02-14 DIAGNOSIS — R293 Abnormal posture: Secondary | ICD-10-CM | POA: Diagnosis not present

## 2014-02-14 DIAGNOSIS — R269 Unspecified abnormalities of gait and mobility: Secondary | ICD-10-CM | POA: Diagnosis not present

## 2014-02-14 DIAGNOSIS — R6889 Other general symptoms and signs: Secondary | ICD-10-CM | POA: Diagnosis not present

## 2014-02-14 DIAGNOSIS — I6990 Unspecified sequelae of unspecified cerebrovascular disease: Secondary | ICD-10-CM | POA: Diagnosis not present

## 2014-02-14 DIAGNOSIS — I69998 Other sequelae following unspecified cerebrovascular disease: Secondary | ICD-10-CM | POA: Diagnosis not present

## 2014-02-15 ENCOUNTER — Non-Acute Institutional Stay (SKILLED_NURSING_FACILITY): Payer: Medicare Other | Admitting: Internal Medicine

## 2014-02-15 DIAGNOSIS — R609 Edema, unspecified: Secondary | ICD-10-CM | POA: Diagnosis not present

## 2014-02-22 ENCOUNTER — Non-Acute Institutional Stay (SKILLED_NURSING_FACILITY): Payer: Medicare Other | Admitting: Internal Medicine

## 2014-02-22 DIAGNOSIS — R569 Unspecified convulsions: Secondary | ICD-10-CM | POA: Diagnosis not present

## 2014-02-22 DIAGNOSIS — E7800 Pure hypercholesterolemia, unspecified: Secondary | ICD-10-CM | POA: Diagnosis not present

## 2014-02-22 DIAGNOSIS — I6990 Unspecified sequelae of unspecified cerebrovascular disease: Secondary | ICD-10-CM | POA: Diagnosis not present

## 2014-02-22 DIAGNOSIS — I10 Essential (primary) hypertension: Secondary | ICD-10-CM

## 2014-02-22 NOTE — Progress Notes (Signed)
Patient ID: James Villegas, male   DOB: 1956-02-27, 58 y.o.   MRN: [id number]           PROGRESS NOTE  DATE: 02/15/2014           FACILITY:  [facility]  LEVEL OF CARE: SNF (31)  Acute Visit  CHIEF COMPLAINT:  Manage right pedal edema.    HISTORY OF PRESENT ILLNESS: I was requested by the staff to assess the patient regarding above problem(s):  Patient reports that his right foot is very edematous.  He denies increasing edema in the lower leg.  He denies calf pain, chest pain, or shortness of breath.  He has right-sided hemiparesis from a CVA.  He is currently not on a diuretic.    PAST MEDICAL HISTORY : Reviewed.  No changes/see problem list  CURRENT MEDICATIONS: Reviewed per [facility]/see medication list  REVIEW OF SYSTEMS:  GENERAL: no change in appetite, no fatigue, no weight changes, no fever, chills or weakness RESPIRATORY: no cough, SOB, DOE,, wheezing, hemoptysis CARDIAC: no chest pain or palpitations; right pedal edema      GI: no abdominal pain, diarrhea, constipation, heart burn, nausea or vomiting  PHYSICAL EXAMINATION  VS: see VS section  GENERAL: no acute distress, morbidly obese body habitus NECK: supple, trachea midline, no neck masses, no thyroid tenderness, no thyromegaly RESPIRATORY: breathing is even & unlabored, BS CTAB CARDIAC: RRR, no murmur,no extra heart sounds; right lower extremity has +2 edema, left lower extremity has +1 edema          GI: abdomen soft, normal BS, no masses, no tenderness, no hepatomegaly, no splenomegaly PSYCHIATRIC: the patient is alert & oriented to person, affect & behavior appropriate  LABS/RADIOLOGY:   11/2013:  BMP normal.     ASSESSMENT/PLAN:  Right pedal edema.  Worsening problem.  We will give Lasix 40 mg q.d. for four days.     CPT CODE: 08657           Angela Cox, MD [facility] [phone]

## 2014-02-23 NOTE — Progress Notes (Signed)
          PROGRESS NOTE  DATE: 02-22-14  FACILITY: [facility]  LEVEL OF CARE: SNF  Routine Visit  CHIEF COMPLAINT:  Manage CVA, seizure disorder and hyperlipidemia  HISTORY OF PRESENT ILLNESS:  REASSESSMENT OF ONGOING PROBLEM(S):  CVA: The patient's CVA remains stable.  Patient denies new neurologic symptoms such as numbness, tingling, weakness, speech difficulties or visual disturbances.  No complications reported from the medications currently being used. He has right-sided hemiparesis.  HYPERLIPIDEMIA: No complications from the medications presently being used. Last fasting lipid panel showed : LDL 120 otherwise Fasting lipid panel normal in 12/13, in 6/14 triglycerides 188, HDL 38 otherwise fasting lipid panel normal, and 12-14 triglycerides 283, LDL 101, total cholesterol 198, HDL 40, in 6-15 LDL 122 otherwise fasting lipid panel normal  SEIZURE DISORDER: The patient's seizure disorder remains stable. No complications reported from the medications presently being used. Staff do not report any recent seizure activity.  PAST MEDICAL HISTORY : Reviewed.  No changes.  CURRENT MEDICATIONS: Reviewed per [facility]  REVIEW OF SYSTEMS:  GENERAL: no change in appetite, no fatigue, no weight changes, no fever, chills or weakness RESPIRATORY: no cough, SOB, DOE, wheezing, hemoptysis CARDIAC: no chest pain, or palpitations, complains of lower extremity swelling GI: no abdominal pain, diarrhea, constipation, heart burn, nausea or vomiting  PHYSICAL EXAMINATION  VS:  See vital signs section  GENERAL: no acute distress, morbidly obese body habitus NECK: supple, trachea midline, no neck masses, no thyroid tenderness, no thyromegaly RESPIRATORY: breathing is even & unlabored, BS CTAB CARDIAC: RRR, no murmur,no extra heart sounds, +2 right lower extremity edema, +3 left lower extremity edema GI: abdomen soft, normal BS, no masses, no tenderness, no hepatomegaly, no splenomegaly PSYCHIATRIC:  the patient is alert & oriented to person, affect & behavior appropriate SKIN: Right great toe has purulent drainage and an ingrown toenail  LABS/RADIOLOGY: 6-15 hemoglobin 12, MCV 87 otherwise CBC normal, total protein 5.5 otherwise CMP normal, Dilantin level 9.1 5-15 Dilantin level 10.3 1-15 hemoglobin 11.8, MCV 99, platelets 441 otherwise CBC normal, total protein 5.9 otherwise liver profile normal 12-14 liver profile normal, hemoglobin A1c 6, CBC normal, total protein 5.4, alkaline phosphatase 138 otherwise CMP normal, Dilantin level 12  6/14 CBC normal, total protein 5.3, albumin 3.4 otherwise CMP normal, Dilantin level 12.1 12/13 CBC normal, total protein 5.4 otherwise CMP normal, Dilantin 9 11/13 TSH 2.6  ASSESSMENT/PLAN:  CVA-continue supportive care. hyperlipidemia -- on maximum dose of Crestor.  hypertension-well controlled peripheral neuropathy-stable. DVT-continue Coumadin. seizure disorder-well controlled  CPT CODE: 13086  James Villegas. James Dory, MD [facility] [phone]

## 2014-03-09 DIAGNOSIS — M6281 Muscle weakness (generalized): Secondary | ICD-10-CM | POA: Diagnosis not present

## 2014-03-09 DIAGNOSIS — I6990 Unspecified sequelae of unspecified cerebrovascular disease: Secondary | ICD-10-CM | POA: Diagnosis not present

## 2014-03-09 DIAGNOSIS — R293 Abnormal posture: Secondary | ICD-10-CM | POA: Diagnosis not present

## 2014-03-09 DIAGNOSIS — G8102 Flaccid hemiplegia affecting left dominant side: Secondary | ICD-10-CM | POA: Diagnosis not present

## 2014-03-09 DIAGNOSIS — R2689 Other abnormalities of gait and mobility: Secondary | ICD-10-CM | POA: Diagnosis not present

## 2014-03-15 ENCOUNTER — Non-Acute Institutional Stay (SKILLED_NURSING_FACILITY): Payer: Medicare Other | Admitting: Internal Medicine

## 2014-03-15 DIAGNOSIS — G609 Hereditary and idiopathic neuropathy, unspecified: Secondary | ICD-10-CM | POA: Diagnosis not present

## 2014-03-15 DIAGNOSIS — I10 Essential (primary) hypertension: Secondary | ICD-10-CM

## 2014-03-15 DIAGNOSIS — E7800 Pure hypercholesterolemia, unspecified: Secondary | ICD-10-CM

## 2014-03-15 DIAGNOSIS — I6990 Unspecified sequelae of unspecified cerebrovascular disease: Secondary | ICD-10-CM

## 2014-03-18 NOTE — Progress Notes (Signed)
          PROGRESS NOTE  DATE: 03-15-14  FACILITY: [facility]  LEVEL OF CARE: SNF  Routine Visit  CHIEF COMPLAINT:  Manage CVA, seizure disorder and hyperlipidemia  HISTORY OF PRESENT ILLNESS:  REASSESSMENT OF ONGOING PROBLEM(S):  CVA: The patient's CVA remains stable.  Patient denies new neurologic symptoms such as numbness, tingling, weakness, speech difficulties or visual disturbances.  No complications reported from the medications currently being used. He has right-sided hemiparesis.  HYPERLIPIDEMIA: No complications from the medications presently being used. Last fasting lipid panel showed : LDL 120 otherwise Fasting lipid panel normal in 12/13, in 6/14 triglycerides 188, HDL 38 otherwise fasting lipid panel normal, and 12-14 triglycerides 283, LDL 101, total cholesterol 198, HDL 40, in 6-15 LDL 122 otherwise fasting lipid panel normal  SEIZURE DISORDER: The patient's seizure disorder remains stable. No complications reported from the medications presently being used. Staff do not report any recent seizure activity.  PAST MEDICAL HISTORY : Reviewed.  No changes.  CURRENT MEDICATIONS: [facility]  REVIEW OF SYSTEMS:  GENERAL: no change in appetite, no fatigue, no weight changes, no fever, chills or weakness RESPIRATORY: no cough, SOB, DOE, wheezing, hemoptysis CARDIAC: no chest pain, or palpitations, complains of lower extremity swelling GI: no abdominal pain, diarrhea, constipation, heart burn, nausea or vomiting  PHYSICAL EXAMINATION  VS:  See vital signs section  GENERAL: no acute distress, morbidly obese body habitus NECK: supple, trachea midline, no neck masses, no thyroid tenderness, no thyromegaly RESPIRATORY: breathing is even & unlabored, BS CTAB CARDIAC: RRR, no murmur,no extra heart sounds, +2 right lower extremity edema, +3 left lower extremity edema GI: abdomen soft, normal BS, no masses, no tenderness, no hepatomegaly, no splenomegaly PSYCHIATRIC:  the patient is alert & oriented to person, affect & behavior appropriate SKIN: Right great toe has purulent drainage and an ingrown toenail  LABS/RADIOLOGY: 6-15 hemoglobin 12, MCV 87 otherwise CBC normal, total protein 5.5 otherwise CMP normal, Dilantin level 9.1 5-15 Dilantin level 10.3 1-15 hemoglobin 11.8, MCV 99, platelets 441 otherwise CBC normal, total protein 5.9 otherwise liver profile normal 12-14 liver profile normal, hemoglobin A1c 6, CBC normal, total protein 5.4, alkaline phosphatase 138 otherwise CMP normal, Dilantin level 12  6/14 CBC normal, total protein 5.3, albumin 3.4 otherwise CMP normal, Dilantin level 12.1 12/13 CBC normal, total protein 5.4 otherwise CMP normal, Dilantin 9 11/13 TSH 2.6  ASSESSMENT/PLAN:  CVA-continue supportive care. hyperlipidemia -- on maximum dose of Crestor.  hypertension-well controlled peripheral neuropathy-stable. DVT-continue Coumadin. seizure disorder-well controlled  CPT CODE: 1610999308  James PiggGayani Y. Kerry Doryasanayaka, MD [facility] [phone]

## 2014-04-10 DIAGNOSIS — I6990 Unspecified sequelae of unspecified cerebrovascular disease: Secondary | ICD-10-CM | POA: Diagnosis not present

## 2014-04-10 DIAGNOSIS — G8102 Flaccid hemiplegia affecting left dominant side: Secondary | ICD-10-CM | POA: Diagnosis not present

## 2014-04-10 DIAGNOSIS — R2689 Other abnormalities of gait and mobility: Secondary | ICD-10-CM | POA: Diagnosis not present

## 2014-04-10 DIAGNOSIS — M6281 Muscle weakness (generalized): Secondary | ICD-10-CM | POA: Diagnosis not present

## 2014-04-10 DIAGNOSIS — Z993 Dependence on wheelchair: Secondary | ICD-10-CM | POA: Diagnosis not present

## 2014-04-12 DIAGNOSIS — Z23 Encounter for immunization: Secondary | ICD-10-CM | POA: Diagnosis not present

## 2014-04-13 DIAGNOSIS — R1310 Dysphagia, unspecified: Secondary | ICD-10-CM | POA: Diagnosis not present

## 2014-04-13 DIAGNOSIS — G40909 Epilepsy, unspecified, not intractable, without status epilepticus: Secondary | ICD-10-CM | POA: Diagnosis not present

## 2014-04-13 DIAGNOSIS — I82401 Acute embolism and thrombosis of unspecified deep veins of right lower extremity: Secondary | ICD-10-CM | POA: Diagnosis not present

## 2014-04-13 DIAGNOSIS — R760 Raised antibody titer: Secondary | ICD-10-CM | POA: Diagnosis not present

## 2014-04-13 DIAGNOSIS — G629 Polyneuropathy, unspecified: Secondary | ICD-10-CM | POA: Diagnosis not present

## 2014-04-13 DIAGNOSIS — F324 Major depressive disorder, single episode, in partial remission: Secondary | ICD-10-CM | POA: Diagnosis not present

## 2014-04-13 DIAGNOSIS — I69359 Hemiplegia and hemiparesis following cerebral infarction affecting unspecified side: Secondary | ICD-10-CM | POA: Diagnosis not present

## 2014-05-09 DIAGNOSIS — I6990 Unspecified sequelae of unspecified cerebrovascular disease: Secondary | ICD-10-CM | POA: Diagnosis not present

## 2014-05-09 DIAGNOSIS — R2689 Other abnormalities of gait and mobility: Secondary | ICD-10-CM | POA: Diagnosis not present

## 2014-05-09 DIAGNOSIS — M6281 Muscle weakness (generalized): Secondary | ICD-10-CM | POA: Diagnosis not present

## 2014-05-10 DIAGNOSIS — M6281 Muscle weakness (generalized): Secondary | ICD-10-CM | POA: Diagnosis not present

## 2014-05-10 DIAGNOSIS — I6990 Unspecified sequelae of unspecified cerebrovascular disease: Secondary | ICD-10-CM | POA: Diagnosis not present

## 2014-05-10 DIAGNOSIS — R2689 Other abnormalities of gait and mobility: Secondary | ICD-10-CM | POA: Diagnosis not present

## 2014-05-11 DIAGNOSIS — N289 Disorder of kidney and ureter, unspecified: Secondary | ICD-10-CM | POA: Diagnosis not present

## 2014-05-11 DIAGNOSIS — Z79899 Other long term (current) drug therapy: Secondary | ICD-10-CM | POA: Diagnosis not present

## 2014-05-11 DIAGNOSIS — D649 Anemia, unspecified: Secondary | ICD-10-CM | POA: Diagnosis not present

## 2014-05-11 DIAGNOSIS — I6990 Unspecified sequelae of unspecified cerebrovascular disease: Secondary | ICD-10-CM | POA: Diagnosis not present

## 2014-05-11 DIAGNOSIS — R569 Unspecified convulsions: Secondary | ICD-10-CM | POA: Diagnosis not present

## 2014-05-11 DIAGNOSIS — D688 Other specified coagulation defects: Secondary | ICD-10-CM | POA: Diagnosis not present

## 2014-05-11 DIAGNOSIS — R2689 Other abnormalities of gait and mobility: Secondary | ICD-10-CM | POA: Diagnosis not present

## 2014-05-11 DIAGNOSIS — M6281 Muscle weakness (generalized): Secondary | ICD-10-CM | POA: Diagnosis not present

## 2014-05-12 DIAGNOSIS — M6281 Muscle weakness (generalized): Secondary | ICD-10-CM | POA: Diagnosis not present

## 2014-05-12 DIAGNOSIS — I6990 Unspecified sequelae of unspecified cerebrovascular disease: Secondary | ICD-10-CM | POA: Diagnosis not present

## 2014-05-12 DIAGNOSIS — R2689 Other abnormalities of gait and mobility: Secondary | ICD-10-CM | POA: Diagnosis not present

## 2014-05-13 DIAGNOSIS — M6281 Muscle weakness (generalized): Secondary | ICD-10-CM | POA: Diagnosis not present

## 2014-05-13 DIAGNOSIS — I6990 Unspecified sequelae of unspecified cerebrovascular disease: Secondary | ICD-10-CM | POA: Diagnosis not present

## 2014-05-13 DIAGNOSIS — R2689 Other abnormalities of gait and mobility: Secondary | ICD-10-CM | POA: Diagnosis not present

## 2014-05-15 DIAGNOSIS — M6281 Muscle weakness (generalized): Secondary | ICD-10-CM | POA: Diagnosis not present

## 2014-05-15 DIAGNOSIS — I6990 Unspecified sequelae of unspecified cerebrovascular disease: Secondary | ICD-10-CM | POA: Diagnosis not present

## 2014-05-15 DIAGNOSIS — R2689 Other abnormalities of gait and mobility: Secondary | ICD-10-CM | POA: Diagnosis not present

## 2014-05-16 DIAGNOSIS — R2689 Other abnormalities of gait and mobility: Secondary | ICD-10-CM | POA: Diagnosis not present

## 2014-05-16 DIAGNOSIS — I6990 Unspecified sequelae of unspecified cerebrovascular disease: Secondary | ICD-10-CM | POA: Diagnosis not present

## 2014-05-16 DIAGNOSIS — M6281 Muscle weakness (generalized): Secondary | ICD-10-CM | POA: Diagnosis not present

## 2014-05-18 DIAGNOSIS — D649 Anemia, unspecified: Secondary | ICD-10-CM | POA: Diagnosis not present

## 2014-05-18 DIAGNOSIS — Z79899 Other long term (current) drug therapy: Secondary | ICD-10-CM | POA: Diagnosis not present

## 2014-05-18 DIAGNOSIS — R569 Unspecified convulsions: Secondary | ICD-10-CM | POA: Diagnosis not present

## 2014-05-19 DIAGNOSIS — M6281 Muscle weakness (generalized): Secondary | ICD-10-CM | POA: Diagnosis not present

## 2014-05-19 DIAGNOSIS — R2689 Other abnormalities of gait and mobility: Secondary | ICD-10-CM | POA: Diagnosis not present

## 2014-05-19 DIAGNOSIS — I6990 Unspecified sequelae of unspecified cerebrovascular disease: Secondary | ICD-10-CM | POA: Diagnosis not present

## 2014-05-22 DIAGNOSIS — I6990 Unspecified sequelae of unspecified cerebrovascular disease: Secondary | ICD-10-CM | POA: Diagnosis not present

## 2014-05-22 DIAGNOSIS — Z7901 Long term (current) use of anticoagulants: Secondary | ICD-10-CM | POA: Diagnosis not present

## 2014-05-22 DIAGNOSIS — H2513 Age-related nuclear cataract, bilateral: Secondary | ICD-10-CM | POA: Diagnosis not present

## 2014-05-22 DIAGNOSIS — R2689 Other abnormalities of gait and mobility: Secondary | ICD-10-CM | POA: Diagnosis not present

## 2014-05-22 DIAGNOSIS — M6281 Muscle weakness (generalized): Secondary | ICD-10-CM | POA: Diagnosis not present

## 2014-06-08 DIAGNOSIS — G8103 Flaccid hemiplegia affecting right nondominant side: Secondary | ICD-10-CM | POA: Diagnosis not present

## 2014-06-15 DIAGNOSIS — F324 Major depressive disorder, single episode, in partial remission: Secondary | ICD-10-CM | POA: Diagnosis not present

## 2014-06-15 DIAGNOSIS — I69359 Hemiplegia and hemiparesis following cerebral infarction affecting unspecified side: Secondary | ICD-10-CM | POA: Diagnosis not present

## 2014-06-15 DIAGNOSIS — R600 Localized edema: Secondary | ICD-10-CM | POA: Diagnosis not present

## 2014-06-15 DIAGNOSIS — I10 Essential (primary) hypertension: Secondary | ICD-10-CM | POA: Diagnosis not present

## 2014-06-15 DIAGNOSIS — I82401 Acute embolism and thrombosis of unspecified deep veins of right lower extremity: Secondary | ICD-10-CM | POA: Diagnosis not present

## 2014-06-15 DIAGNOSIS — G40909 Epilepsy, unspecified, not intractable, without status epilepticus: Secondary | ICD-10-CM | POA: Diagnosis not present

## 2014-06-23 DIAGNOSIS — I6990 Unspecified sequelae of unspecified cerebrovascular disease: Secondary | ICD-10-CM | POA: Diagnosis not present

## 2014-06-23 DIAGNOSIS — D649 Anemia, unspecified: Secondary | ICD-10-CM | POA: Diagnosis not present

## 2014-06-23 DIAGNOSIS — I69952 Hemiplegia and hemiparesis following unspecified cerebrovascular disease affecting left dominant side: Secondary | ICD-10-CM | POA: Diagnosis not present

## 2014-06-28 DIAGNOSIS — E08319 Diabetes mellitus due to underlying condition with unspecified diabetic retinopathy without macular edema: Secondary | ICD-10-CM | POA: Diagnosis not present

## 2014-07-21 DIAGNOSIS — R600 Localized edema: Secondary | ICD-10-CM | POA: Diagnosis not present

## 2014-08-17 DIAGNOSIS — I82402 Acute embolism and thrombosis of unspecified deep veins of left lower extremity: Secondary | ICD-10-CM | POA: Diagnosis not present

## 2014-08-17 DIAGNOSIS — F322 Major depressive disorder, single episode, severe without psychotic features: Secondary | ICD-10-CM | POA: Diagnosis not present

## 2014-08-17 DIAGNOSIS — I10 Essential (primary) hypertension: Secondary | ICD-10-CM | POA: Diagnosis not present

## 2014-08-17 DIAGNOSIS — I69359 Hemiplegia and hemiparesis following cerebral infarction affecting unspecified side: Secondary | ICD-10-CM | POA: Diagnosis not present

## 2014-08-17 DIAGNOSIS — R569 Unspecified convulsions: Secondary | ICD-10-CM | POA: Diagnosis not present

## 2014-08-25 ENCOUNTER — Emergency Department (HOSPITAL_COMMUNITY): Payer: Medicare Other

## 2014-08-25 ENCOUNTER — Emergency Department (HOSPITAL_COMMUNITY)
Admission: EM | Admit: 2014-08-25 | Discharge: 2014-08-25 | Disposition: A | Discharge status: Home / Self Care | Payer: Medicare Other | Attending: Emergency Medicine | Admitting: Emergency Medicine

## 2014-08-25 DIAGNOSIS — S0993XA Unspecified injury of face, initial encounter: Secondary | ICD-10-CM | POA: Diagnosis not present

## 2014-08-25 DIAGNOSIS — Z7952 Long term (current) use of systemic steroids: Secondary | ICD-10-CM | POA: Diagnosis not present

## 2014-08-25 DIAGNOSIS — G629 Polyneuropathy, unspecified: Secondary | ICD-10-CM | POA: Diagnosis not present

## 2014-08-25 DIAGNOSIS — Z862 Personal history of diseases of the blood and blood-forming organs and certain disorders involving the immune mechanism: Secondary | ICD-10-CM | POA: Insufficient documentation

## 2014-08-25 DIAGNOSIS — R51 Headache: Secondary | ICD-10-CM | POA: Diagnosis not present

## 2014-08-25 DIAGNOSIS — F419 Anxiety disorder, unspecified: Secondary | ICD-10-CM | POA: Diagnosis not present

## 2014-08-25 DIAGNOSIS — Z79899 Other long term (current) drug therapy: Secondary | ICD-10-CM | POA: Diagnosis not present

## 2014-08-25 DIAGNOSIS — S0990XA Unspecified injury of head, initial encounter: Secondary | ICD-10-CM

## 2014-08-25 DIAGNOSIS — Z8673 Personal history of transient ischemic attack (TIA), and cerebral infarction without residual deficits: Secondary | ICD-10-CM | POA: Diagnosis not present

## 2014-08-25 DIAGNOSIS — Y9389 Activity, other specified: Secondary | ICD-10-CM | POA: Insufficient documentation

## 2014-08-25 DIAGNOSIS — Y92410 Unspecified street and highway as the place of occurrence of the external cause: Secondary | ICD-10-CM | POA: Diagnosis not present

## 2014-08-25 DIAGNOSIS — S01111A Laceration without foreign body of right eyelid and periocular area, initial encounter: Secondary | ICD-10-CM | POA: Diagnosis not present

## 2014-08-25 DIAGNOSIS — T148 Other injury of unspecified body region: Secondary | ICD-10-CM | POA: Diagnosis not present

## 2014-08-25 DIAGNOSIS — Z86718 Personal history of other venous thrombosis and embolism: Secondary | ICD-10-CM | POA: Diagnosis not present

## 2014-08-25 DIAGNOSIS — Y998 Other external cause status: Secondary | ICD-10-CM | POA: Diagnosis not present

## 2014-08-25 DIAGNOSIS — S0083XA Contusion of other part of head, initial encounter: Secondary | ICD-10-CM

## 2014-08-25 DIAGNOSIS — M25511 Pain in right shoulder: Secondary | ICD-10-CM | POA: Diagnosis not present

## 2014-08-25 DIAGNOSIS — F329 Major depressive disorder, single episode, unspecified: Secondary | ICD-10-CM | POA: Insufficient documentation

## 2014-08-25 DIAGNOSIS — Q211 Atrial septal defect: Secondary | ICD-10-CM | POA: Diagnosis not present

## 2014-08-25 DIAGNOSIS — I10 Essential (primary) hypertension: Secondary | ICD-10-CM | POA: Diagnosis not present

## 2014-08-25 DIAGNOSIS — Z8719 Personal history of other diseases of the digestive system: Secondary | ICD-10-CM | POA: Insufficient documentation

## 2014-08-25 DIAGNOSIS — Z7901 Long term (current) use of anticoagulants: Secondary | ICD-10-CM | POA: Insufficient documentation

## 2014-08-25 DIAGNOSIS — S0181XA Laceration without foreign body of other part of head, initial encounter: Secondary | ICD-10-CM

## 2014-08-25 DIAGNOSIS — Z87891 Personal history of nicotine dependence: Secondary | ICD-10-CM | POA: Diagnosis not present

## 2014-08-25 DIAGNOSIS — E785 Hyperlipidemia, unspecified: Secondary | ICD-10-CM | POA: Diagnosis not present

## 2014-08-25 DIAGNOSIS — S199XXA Unspecified injury of neck, initial encounter: Secondary | ICD-10-CM | POA: Diagnosis not present

## 2014-08-25 DIAGNOSIS — G40909 Epilepsy, unspecified, not intractable, without status epilepticus: Secondary | ICD-10-CM | POA: Insufficient documentation

## 2014-08-25 DIAGNOSIS — S299XXA Unspecified injury of thorax, initial encounter: Secondary | ICD-10-CM | POA: Diagnosis not present

## 2014-08-25 DIAGNOSIS — M542 Cervicalgia: Secondary | ICD-10-CM | POA: Diagnosis not present

## 2014-08-25 DIAGNOSIS — S3993XA Unspecified injury of pelvis, initial encounter: Secondary | ICD-10-CM | POA: Diagnosis not present

## 2014-08-25 MED ORDER — LIDOCAINE HCL (PF) 1 % IJ SOLN
30.0000 mL | Freq: Once | INTRAMUSCULAR | Status: AC
Start: 2014-08-25 — End: 2014-08-25
  Administered 2014-08-25: 30 mL via INTRADERMAL
  Filled 2014-08-25: qty 30

## 2014-08-25 MED ORDER — LIDOCAINE-EPINEPHRINE (PF) 2 %-1:200000 IJ SOLN: 10.0000 mL | Freq: Once | INTRAMUSCULAR | Status: DC

## 2014-08-25 MED ORDER — HYDROCODONE-ACETAMINOPHEN 5-325 MG PO TABS
2.0000 | ORAL_TABLET | Freq: Once | ORAL | Status: AC
  Administered 2014-08-25: 2 via ORAL
  Filled 2014-08-25: qty 2

## 2014-08-25 NOTE — Discharge Instructions (Signed)
Have sutures removed in approximately one week, keep wound clean and dry. Watch for signs of infection such as pus draining, spreading redness.  If you were given medicines take as directed.  If you are on coumadin or contraceptives realize their levels and effectiveness is altered by many different medicines.  If you have any reaction (rash, tongues swelling, other) to the medicines stop taking and see a physician.   Please follow up as directed and return to the ER or see a physician for new or worsening symptoms.  Thank you. Filed Vitals:   08/25/14 1307 08/25/14 1439 08/25/14 1440 08/25/14 1445  BP: 116/78 129/76 129/76 122/69  Pulse: 65  66 67  Temp: 98.1 F (36.7 C) 97.7 F (36.5 C)    TempSrc:  Oral    Resp:  11  19  SpO2: 98% 99% 99% 97%
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
## 2014-08-25 NOTE — ED Provider Notes (Signed)
LACERATION REPAIR Performed by: Sharlene Mottsartner, Venkat Ankney W Authorized by: Sharlene Mottsartner, Tahani Potier W Consent: Verbal consent obtained. Risks and benefits: risks, benefits and alternatives were discussed Consent given by: patient Patient identity confirmed: provided demographic data Prepped and Draped in normal sterile fashion Wound explored  Laceration Location: Right eyebrow  Laceration Length: 3 cm  No Foreign Bodies seen or palpated  Anesthesia: local infiltration  Local anesthetic: lidocaine 1 % without epinephrine  Anesthetic total: 3 ml  Irrigation method: syringe Amount of cleaning: standard  Skin closure: Suture, 7-0 Prolene   Number of sutures: 4   Technique: Simple interrupted   Patient tolerance: Patient tolerated the procedure well with no immediate complications.   James PeekBenjamin Caton Popowski, PA-C 08/25/14 1543  Blane OharaJoshua Zavitz, MD 08/26/14 [phone]

## 2014-08-25 NOTE — Progress Notes (Signed)
Confirmed with [facility] that [facility] would be coming to hospital [facility]  RN/pt's friend informed.
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
## 2014-08-25 NOTE — ED Notes (Signed)
Pt reports right shoulder pain and right hip pain.

## 2014-09-01 DIAGNOSIS — I6789 Other cerebrovascular disease: Secondary | ICD-10-CM | POA: Diagnosis not present

## 2014-09-01 DIAGNOSIS — M6281 Muscle weakness (generalized): Secondary | ICD-10-CM | POA: Diagnosis not present

## 2014-09-01 DIAGNOSIS — I679 Cerebrovascular disease, unspecified: Secondary | ICD-10-CM | POA: Diagnosis not present

## 2014-09-01 DIAGNOSIS — R2689 Other abnormalities of gait and mobility: Secondary | ICD-10-CM | POA: Diagnosis not present

## 2014-09-01 DIAGNOSIS — Z9181 History of falling: Secondary | ICD-10-CM | POA: Diagnosis not present

## 2014-09-01 DIAGNOSIS — W1789XA Other fall from one level to another, initial encounter: Secondary | ICD-10-CM | POA: Diagnosis not present

## 2014-09-01 DIAGNOSIS — I6990 Unspecified sequelae of unspecified cerebrovascular disease: Secondary | ICD-10-CM | POA: Diagnosis not present

## 2014-09-04 DIAGNOSIS — I6789 Other cerebrovascular disease: Secondary | ICD-10-CM | POA: Diagnosis not present

## 2014-09-04 DIAGNOSIS — R2689 Other abnormalities of gait and mobility: Secondary | ICD-10-CM | POA: Diagnosis not present

## 2014-09-04 DIAGNOSIS — M6281 Muscle weakness (generalized): Secondary | ICD-10-CM | POA: Diagnosis not present

## 2014-09-04 DIAGNOSIS — I6990 Unspecified sequelae of unspecified cerebrovascular disease: Secondary | ICD-10-CM | POA: Diagnosis not present

## 2014-09-04 DIAGNOSIS — I679 Cerebrovascular disease, unspecified: Secondary | ICD-10-CM | POA: Diagnosis not present

## 2014-09-04 DIAGNOSIS — W1789XA Other fall from one level to another, initial encounter: Secondary | ICD-10-CM | POA: Diagnosis not present

## 2014-09-06 DIAGNOSIS — I679 Cerebrovascular disease, unspecified: Secondary | ICD-10-CM | POA: Diagnosis not present

## 2014-09-06 DIAGNOSIS — M6281 Muscle weakness (generalized): Secondary | ICD-10-CM | POA: Diagnosis not present

## 2014-09-06 DIAGNOSIS — R2689 Other abnormalities of gait and mobility: Secondary | ICD-10-CM | POA: Diagnosis not present

## 2014-09-06 DIAGNOSIS — W1789XA Other fall from one level to another, initial encounter: Secondary | ICD-10-CM | POA: Diagnosis not present

## 2014-09-06 DIAGNOSIS — I6789 Other cerebrovascular disease: Secondary | ICD-10-CM | POA: Diagnosis not present

## 2014-09-06 DIAGNOSIS — I6990 Unspecified sequelae of unspecified cerebrovascular disease: Secondary | ICD-10-CM | POA: Diagnosis not present

## 2014-09-07 DIAGNOSIS — W1789XA Other fall from one level to another, initial encounter: Secondary | ICD-10-CM | POA: Diagnosis not present

## 2014-09-07 DIAGNOSIS — I6990 Unspecified sequelae of unspecified cerebrovascular disease: Secondary | ICD-10-CM | POA: Diagnosis not present

## 2014-09-07 DIAGNOSIS — I679 Cerebrovascular disease, unspecified: Secondary | ICD-10-CM | POA: Diagnosis not present

## 2014-09-07 DIAGNOSIS — R2689 Other abnormalities of gait and mobility: Secondary | ICD-10-CM | POA: Diagnosis not present

## 2014-09-07 DIAGNOSIS — M6281 Muscle weakness (generalized): Secondary | ICD-10-CM | POA: Diagnosis not present

## 2014-09-07 DIAGNOSIS — I6789 Other cerebrovascular disease: Secondary | ICD-10-CM | POA: Diagnosis not present

## 2014-09-08 DIAGNOSIS — M6281 Muscle weakness (generalized): Secondary | ICD-10-CM | POA: Diagnosis not present

## 2014-09-08 DIAGNOSIS — W1789XA Other fall from one level to another, initial encounter: Secondary | ICD-10-CM | POA: Diagnosis not present

## 2014-09-08 DIAGNOSIS — I6990 Unspecified sequelae of unspecified cerebrovascular disease: Secondary | ICD-10-CM | POA: Diagnosis not present

## 2014-09-08 DIAGNOSIS — I6789 Other cerebrovascular disease: Secondary | ICD-10-CM | POA: Diagnosis not present

## 2014-09-08 DIAGNOSIS — Z9181 History of falling: Secondary | ICD-10-CM | POA: Diagnosis not present

## 2014-09-08 DIAGNOSIS — R2689 Other abnormalities of gait and mobility: Secondary | ICD-10-CM | POA: Diagnosis not present

## 2014-09-08 DIAGNOSIS — I679 Cerebrovascular disease, unspecified: Secondary | ICD-10-CM | POA: Diagnosis not present

## 2014-09-11 DIAGNOSIS — I679 Cerebrovascular disease, unspecified: Secondary | ICD-10-CM | POA: Diagnosis not present

## 2014-09-11 DIAGNOSIS — I6789 Other cerebrovascular disease: Secondary | ICD-10-CM | POA: Diagnosis not present

## 2014-09-11 DIAGNOSIS — W1789XA Other fall from one level to another, initial encounter: Secondary | ICD-10-CM | POA: Diagnosis not present

## 2014-09-11 DIAGNOSIS — M6281 Muscle weakness (generalized): Secondary | ICD-10-CM | POA: Diagnosis not present

## 2014-09-11 DIAGNOSIS — R2689 Other abnormalities of gait and mobility: Secondary | ICD-10-CM | POA: Diagnosis not present

## 2014-09-11 DIAGNOSIS — I6990 Unspecified sequelae of unspecified cerebrovascular disease: Secondary | ICD-10-CM | POA: Diagnosis not present

## 2014-09-12 DIAGNOSIS — W1789XA Other fall from one level to another, initial encounter: Secondary | ICD-10-CM | POA: Diagnosis not present

## 2014-09-12 DIAGNOSIS — I6789 Other cerebrovascular disease: Secondary | ICD-10-CM | POA: Diagnosis not present

## 2014-09-12 DIAGNOSIS — M6281 Muscle weakness (generalized): Secondary | ICD-10-CM | POA: Diagnosis not present

## 2014-09-12 DIAGNOSIS — R2689 Other abnormalities of gait and mobility: Secondary | ICD-10-CM | POA: Diagnosis not present

## 2014-09-12 DIAGNOSIS — I6990 Unspecified sequelae of unspecified cerebrovascular disease: Secondary | ICD-10-CM | POA: Diagnosis not present

## 2014-09-12 DIAGNOSIS — I679 Cerebrovascular disease, unspecified: Secondary | ICD-10-CM | POA: Diagnosis not present

## 2014-09-14 DIAGNOSIS — I6990 Unspecified sequelae of unspecified cerebrovascular disease: Secondary | ICD-10-CM | POA: Diagnosis not present

## 2014-09-14 DIAGNOSIS — W1789XA Other fall from one level to another, initial encounter: Secondary | ICD-10-CM | POA: Diagnosis not present

## 2014-09-14 DIAGNOSIS — I679 Cerebrovascular disease, unspecified: Secondary | ICD-10-CM | POA: Diagnosis not present

## 2014-09-14 DIAGNOSIS — M6281 Muscle weakness (generalized): Secondary | ICD-10-CM | POA: Diagnosis not present

## 2014-09-14 DIAGNOSIS — R2689 Other abnormalities of gait and mobility: Secondary | ICD-10-CM | POA: Diagnosis not present

## 2014-09-14 DIAGNOSIS — I6789 Other cerebrovascular disease: Secondary | ICD-10-CM | POA: Diagnosis not present

## 2014-09-15 DIAGNOSIS — I6990 Unspecified sequelae of unspecified cerebrovascular disease: Secondary | ICD-10-CM | POA: Diagnosis not present

## 2014-09-15 DIAGNOSIS — I6789 Other cerebrovascular disease: Secondary | ICD-10-CM | POA: Diagnosis not present

## 2014-09-15 DIAGNOSIS — M6281 Muscle weakness (generalized): Secondary | ICD-10-CM | POA: Diagnosis not present

## 2014-09-15 DIAGNOSIS — W1789XA Other fall from one level to another, initial encounter: Secondary | ICD-10-CM | POA: Diagnosis not present

## 2014-09-15 DIAGNOSIS — R2689 Other abnormalities of gait and mobility: Secondary | ICD-10-CM | POA: Diagnosis not present

## 2014-09-15 DIAGNOSIS — I679 Cerebrovascular disease, unspecified: Secondary | ICD-10-CM | POA: Diagnosis not present

## 2014-09-18 DIAGNOSIS — I6789 Other cerebrovascular disease: Secondary | ICD-10-CM | POA: Diagnosis not present

## 2014-09-18 DIAGNOSIS — M6281 Muscle weakness (generalized): Secondary | ICD-10-CM | POA: Diagnosis not present

## 2014-09-18 DIAGNOSIS — R2689 Other abnormalities of gait and mobility: Secondary | ICD-10-CM | POA: Diagnosis not present

## 2014-09-18 DIAGNOSIS — W1789XA Other fall from one level to another, initial encounter: Secondary | ICD-10-CM | POA: Diagnosis not present

## 2014-09-18 DIAGNOSIS — I6990 Unspecified sequelae of unspecified cerebrovascular disease: Secondary | ICD-10-CM | POA: Diagnosis not present

## 2014-09-18 DIAGNOSIS — I679 Cerebrovascular disease, unspecified: Secondary | ICD-10-CM | POA: Diagnosis not present

## 2014-09-19 DIAGNOSIS — W1789XA Other fall from one level to another, initial encounter: Secondary | ICD-10-CM | POA: Diagnosis not present

## 2014-09-19 DIAGNOSIS — I6789 Other cerebrovascular disease: Secondary | ICD-10-CM | POA: Diagnosis not present

## 2014-09-19 DIAGNOSIS — R2689 Other abnormalities of gait and mobility: Secondary | ICD-10-CM | POA: Diagnosis not present

## 2014-09-19 DIAGNOSIS — I679 Cerebrovascular disease, unspecified: Secondary | ICD-10-CM | POA: Diagnosis not present

## 2014-09-19 DIAGNOSIS — I6990 Unspecified sequelae of unspecified cerebrovascular disease: Secondary | ICD-10-CM | POA: Diagnosis not present

## 2014-09-19 DIAGNOSIS — M6281 Muscle weakness (generalized): Secondary | ICD-10-CM | POA: Diagnosis not present

## 2014-09-20 DIAGNOSIS — R2689 Other abnormalities of gait and mobility: Secondary | ICD-10-CM | POA: Diagnosis not present

## 2014-09-20 DIAGNOSIS — W1789XA Other fall from one level to another, initial encounter: Secondary | ICD-10-CM | POA: Diagnosis not present

## 2014-09-20 DIAGNOSIS — M6281 Muscle weakness (generalized): Secondary | ICD-10-CM | POA: Diagnosis not present

## 2014-09-20 DIAGNOSIS — I679 Cerebrovascular disease, unspecified: Secondary | ICD-10-CM | POA: Diagnosis not present

## 2014-09-20 DIAGNOSIS — I6990 Unspecified sequelae of unspecified cerebrovascular disease: Secondary | ICD-10-CM | POA: Diagnosis not present

## 2014-09-20 DIAGNOSIS — I6789 Other cerebrovascular disease: Secondary | ICD-10-CM | POA: Diagnosis not present

## 2014-09-21 DIAGNOSIS — I679 Cerebrovascular disease, unspecified: Secondary | ICD-10-CM | POA: Diagnosis not present

## 2014-09-21 DIAGNOSIS — M6281 Muscle weakness (generalized): Secondary | ICD-10-CM | POA: Diagnosis not present

## 2014-09-21 DIAGNOSIS — W1789XA Other fall from one level to another, initial encounter: Secondary | ICD-10-CM | POA: Diagnosis not present

## 2014-09-21 DIAGNOSIS — I6990 Unspecified sequelae of unspecified cerebrovascular disease: Secondary | ICD-10-CM | POA: Diagnosis not present

## 2014-09-21 DIAGNOSIS — R2689 Other abnormalities of gait and mobility: Secondary | ICD-10-CM | POA: Diagnosis not present

## 2014-09-21 DIAGNOSIS — I6789 Other cerebrovascular disease: Secondary | ICD-10-CM | POA: Diagnosis not present

## 2014-09-22 DIAGNOSIS — R2689 Other abnormalities of gait and mobility: Secondary | ICD-10-CM | POA: Diagnosis not present

## 2014-09-22 DIAGNOSIS — I6789 Other cerebrovascular disease: Secondary | ICD-10-CM | POA: Diagnosis not present

## 2014-09-22 DIAGNOSIS — I679 Cerebrovascular disease, unspecified: Secondary | ICD-10-CM | POA: Diagnosis not present

## 2014-09-22 DIAGNOSIS — W1789XA Other fall from one level to another, initial encounter: Secondary | ICD-10-CM | POA: Diagnosis not present

## 2014-09-22 DIAGNOSIS — I6990 Unspecified sequelae of unspecified cerebrovascular disease: Secondary | ICD-10-CM | POA: Diagnosis not present

## 2014-09-22 DIAGNOSIS — M6281 Muscle weakness (generalized): Secondary | ICD-10-CM | POA: Diagnosis not present

## 2014-09-23 DIAGNOSIS — I6789 Other cerebrovascular disease: Secondary | ICD-10-CM | POA: Diagnosis not present

## 2014-09-23 DIAGNOSIS — W1789XA Other fall from one level to another, initial encounter: Secondary | ICD-10-CM | POA: Diagnosis not present

## 2014-09-23 DIAGNOSIS — I679 Cerebrovascular disease, unspecified: Secondary | ICD-10-CM | POA: Diagnosis not present

## 2014-09-23 DIAGNOSIS — R2689 Other abnormalities of gait and mobility: Secondary | ICD-10-CM | POA: Diagnosis not present

## 2014-09-23 DIAGNOSIS — I6990 Unspecified sequelae of unspecified cerebrovascular disease: Secondary | ICD-10-CM | POA: Diagnosis not present

## 2014-09-23 DIAGNOSIS — M6281 Muscle weakness (generalized): Secondary | ICD-10-CM | POA: Diagnosis not present

## 2014-09-25 DIAGNOSIS — I6789 Other cerebrovascular disease: Secondary | ICD-10-CM | POA: Diagnosis not present

## 2014-09-25 DIAGNOSIS — M6281 Muscle weakness (generalized): Secondary | ICD-10-CM | POA: Diagnosis not present

## 2014-09-25 DIAGNOSIS — I679 Cerebrovascular disease, unspecified: Secondary | ICD-10-CM | POA: Diagnosis not present

## 2014-09-25 DIAGNOSIS — R2689 Other abnormalities of gait and mobility: Secondary | ICD-10-CM | POA: Diagnosis not present

## 2014-09-25 DIAGNOSIS — I6990 Unspecified sequelae of unspecified cerebrovascular disease: Secondary | ICD-10-CM | POA: Diagnosis not present

## 2014-09-25 DIAGNOSIS — W1789XA Other fall from one level to another, initial encounter: Secondary | ICD-10-CM | POA: Diagnosis not present

## 2014-09-26 DIAGNOSIS — I679 Cerebrovascular disease, unspecified: Secondary | ICD-10-CM | POA: Diagnosis not present

## 2014-09-26 DIAGNOSIS — I6789 Other cerebrovascular disease: Secondary | ICD-10-CM | POA: Diagnosis not present

## 2014-09-26 DIAGNOSIS — I6990 Unspecified sequelae of unspecified cerebrovascular disease: Secondary | ICD-10-CM | POA: Diagnosis not present

## 2014-09-26 DIAGNOSIS — R2689 Other abnormalities of gait and mobility: Secondary | ICD-10-CM | POA: Diagnosis not present

## 2014-09-26 DIAGNOSIS — M6281 Muscle weakness (generalized): Secondary | ICD-10-CM | POA: Diagnosis not present

## 2014-09-26 DIAGNOSIS — W1789XA Other fall from one level to another, initial encounter: Secondary | ICD-10-CM | POA: Diagnosis not present

## 2014-09-27 DIAGNOSIS — I679 Cerebrovascular disease, unspecified: Secondary | ICD-10-CM | POA: Diagnosis not present

## 2014-09-27 DIAGNOSIS — M6281 Muscle weakness (generalized): Secondary | ICD-10-CM | POA: Diagnosis not present

## 2014-09-27 DIAGNOSIS — I6990 Unspecified sequelae of unspecified cerebrovascular disease: Secondary | ICD-10-CM | POA: Diagnosis not present

## 2014-09-27 DIAGNOSIS — R2689 Other abnormalities of gait and mobility: Secondary | ICD-10-CM | POA: Diagnosis not present

## 2014-09-27 DIAGNOSIS — W1789XA Other fall from one level to another, initial encounter: Secondary | ICD-10-CM | POA: Diagnosis not present

## 2014-09-27 DIAGNOSIS — I6789 Other cerebrovascular disease: Secondary | ICD-10-CM | POA: Diagnosis not present

## 2014-09-28 DIAGNOSIS — W1789XA Other fall from one level to another, initial encounter: Secondary | ICD-10-CM | POA: Diagnosis not present

## 2014-09-28 DIAGNOSIS — I6789 Other cerebrovascular disease: Secondary | ICD-10-CM | POA: Diagnosis not present

## 2014-09-28 DIAGNOSIS — I679 Cerebrovascular disease, unspecified: Secondary | ICD-10-CM | POA: Diagnosis not present

## 2014-09-28 DIAGNOSIS — I6990 Unspecified sequelae of unspecified cerebrovascular disease: Secondary | ICD-10-CM | POA: Diagnosis not present

## 2014-09-28 DIAGNOSIS — R2689 Other abnormalities of gait and mobility: Secondary | ICD-10-CM | POA: Diagnosis not present

## 2014-09-28 DIAGNOSIS — M6281 Muscle weakness (generalized): Secondary | ICD-10-CM | POA: Diagnosis not present

## 2014-09-29 DIAGNOSIS — I679 Cerebrovascular disease, unspecified: Secondary | ICD-10-CM | POA: Diagnosis not present

## 2014-09-29 DIAGNOSIS — R2689 Other abnormalities of gait and mobility: Secondary | ICD-10-CM | POA: Diagnosis not present

## 2014-09-29 DIAGNOSIS — M6281 Muscle weakness (generalized): Secondary | ICD-10-CM | POA: Diagnosis not present

## 2014-09-29 DIAGNOSIS — I6789 Other cerebrovascular disease: Secondary | ICD-10-CM | POA: Diagnosis not present

## 2014-09-29 DIAGNOSIS — I6990 Unspecified sequelae of unspecified cerebrovascular disease: Secondary | ICD-10-CM | POA: Diagnosis not present

## 2014-09-29 DIAGNOSIS — W1789XA Other fall from one level to another, initial encounter: Secondary | ICD-10-CM | POA: Diagnosis not present

## 2014-10-02 DIAGNOSIS — I6990 Unspecified sequelae of unspecified cerebrovascular disease: Secondary | ICD-10-CM | POA: Diagnosis not present

## 2014-10-02 DIAGNOSIS — I679 Cerebrovascular disease, unspecified: Secondary | ICD-10-CM | POA: Diagnosis not present

## 2014-10-02 DIAGNOSIS — R2689 Other abnormalities of gait and mobility: Secondary | ICD-10-CM | POA: Diagnosis not present

## 2014-10-02 DIAGNOSIS — I6789 Other cerebrovascular disease: Secondary | ICD-10-CM | POA: Diagnosis not present

## 2014-10-02 DIAGNOSIS — W1789XA Other fall from one level to another, initial encounter: Secondary | ICD-10-CM | POA: Diagnosis not present

## 2014-10-02 DIAGNOSIS — M6281 Muscle weakness (generalized): Secondary | ICD-10-CM | POA: Diagnosis not present

## 2014-10-03 DIAGNOSIS — I679 Cerebrovascular disease, unspecified: Secondary | ICD-10-CM | POA: Diagnosis not present

## 2014-10-03 DIAGNOSIS — I6990 Unspecified sequelae of unspecified cerebrovascular disease: Secondary | ICD-10-CM | POA: Diagnosis not present

## 2014-10-03 DIAGNOSIS — M6281 Muscle weakness (generalized): Secondary | ICD-10-CM | POA: Diagnosis not present

## 2014-10-03 DIAGNOSIS — I6789 Other cerebrovascular disease: Secondary | ICD-10-CM | POA: Diagnosis not present

## 2014-10-03 DIAGNOSIS — W1789XA Other fall from one level to another, initial encounter: Secondary | ICD-10-CM | POA: Diagnosis not present

## 2014-10-03 DIAGNOSIS — R2689 Other abnormalities of gait and mobility: Secondary | ICD-10-CM | POA: Diagnosis not present

## 2014-10-04 DIAGNOSIS — I6789 Other cerebrovascular disease: Secondary | ICD-10-CM | POA: Diagnosis not present

## 2014-10-04 DIAGNOSIS — W1789XA Other fall from one level to another, initial encounter: Secondary | ICD-10-CM | POA: Diagnosis not present

## 2014-10-04 DIAGNOSIS — I679 Cerebrovascular disease, unspecified: Secondary | ICD-10-CM | POA: Diagnosis not present

## 2014-10-04 DIAGNOSIS — M6281 Muscle weakness (generalized): Secondary | ICD-10-CM | POA: Diagnosis not present

## 2014-10-04 DIAGNOSIS — I6990 Unspecified sequelae of unspecified cerebrovascular disease: Secondary | ICD-10-CM | POA: Diagnosis not present

## 2014-10-04 DIAGNOSIS — R2689 Other abnormalities of gait and mobility: Secondary | ICD-10-CM | POA: Diagnosis not present

## 2014-10-05 DIAGNOSIS — I679 Cerebrovascular disease, unspecified: Secondary | ICD-10-CM | POA: Diagnosis not present

## 2014-10-05 DIAGNOSIS — I6990 Unspecified sequelae of unspecified cerebrovascular disease: Secondary | ICD-10-CM | POA: Diagnosis not present

## 2014-10-05 DIAGNOSIS — I6789 Other cerebrovascular disease: Secondary | ICD-10-CM | POA: Diagnosis not present

## 2014-10-05 DIAGNOSIS — R2689 Other abnormalities of gait and mobility: Secondary | ICD-10-CM | POA: Diagnosis not present

## 2014-10-05 DIAGNOSIS — M6281 Muscle weakness (generalized): Secondary | ICD-10-CM | POA: Diagnosis not present

## 2014-10-05 DIAGNOSIS — W1789XA Other fall from one level to another, initial encounter: Secondary | ICD-10-CM | POA: Diagnosis not present

## 2014-10-06 DIAGNOSIS — M6281 Muscle weakness (generalized): Secondary | ICD-10-CM | POA: Diagnosis not present

## 2014-10-06 DIAGNOSIS — W1789XA Other fall from one level to another, initial encounter: Secondary | ICD-10-CM | POA: Diagnosis not present

## 2014-10-06 DIAGNOSIS — R2689 Other abnormalities of gait and mobility: Secondary | ICD-10-CM | POA: Diagnosis not present

## 2014-10-06 DIAGNOSIS — I6789 Other cerebrovascular disease: Secondary | ICD-10-CM | POA: Diagnosis not present

## 2014-10-06 DIAGNOSIS — I6990 Unspecified sequelae of unspecified cerebrovascular disease: Secondary | ICD-10-CM | POA: Diagnosis not present

## 2014-10-06 DIAGNOSIS — I679 Cerebrovascular disease, unspecified: Secondary | ICD-10-CM | POA: Diagnosis not present

## 2014-10-07 DIAGNOSIS — I6789 Other cerebrovascular disease: Secondary | ICD-10-CM | POA: Diagnosis not present

## 2014-10-07 DIAGNOSIS — I679 Cerebrovascular disease, unspecified: Secondary | ICD-10-CM | POA: Diagnosis not present

## 2014-10-07 DIAGNOSIS — R2689 Other abnormalities of gait and mobility: Secondary | ICD-10-CM | POA: Diagnosis not present

## 2014-10-07 DIAGNOSIS — W1789XA Other fall from one level to another, initial encounter: Secondary | ICD-10-CM | POA: Diagnosis not present

## 2014-10-07 DIAGNOSIS — I6990 Unspecified sequelae of unspecified cerebrovascular disease: Secondary | ICD-10-CM | POA: Diagnosis not present

## 2014-10-07 DIAGNOSIS — M6281 Muscle weakness (generalized): Secondary | ICD-10-CM | POA: Diagnosis not present

## 2014-10-09 DIAGNOSIS — I6990 Unspecified sequelae of unspecified cerebrovascular disease: Secondary | ICD-10-CM | POA: Diagnosis not present

## 2014-10-09 DIAGNOSIS — Z9181 History of falling: Secondary | ICD-10-CM | POA: Diagnosis not present

## 2014-10-09 DIAGNOSIS — I6789 Other cerebrovascular disease: Secondary | ICD-10-CM | POA: Diagnosis not present

## 2014-10-09 DIAGNOSIS — R2689 Other abnormalities of gait and mobility: Secondary | ICD-10-CM | POA: Diagnosis not present

## 2014-10-09 DIAGNOSIS — I679 Cerebrovascular disease, unspecified: Secondary | ICD-10-CM | POA: Diagnosis not present

## 2014-10-09 DIAGNOSIS — W1789XA Other fall from one level to another, initial encounter: Secondary | ICD-10-CM | POA: Diagnosis not present

## 2014-10-09 DIAGNOSIS — M6281 Muscle weakness (generalized): Secondary | ICD-10-CM | POA: Diagnosis not present

## 2014-10-10 DIAGNOSIS — I6789 Other cerebrovascular disease: Secondary | ICD-10-CM | POA: Diagnosis not present

## 2014-10-10 DIAGNOSIS — W1789XA Other fall from one level to another, initial encounter: Secondary | ICD-10-CM | POA: Diagnosis not present

## 2014-10-10 DIAGNOSIS — M6281 Muscle weakness (generalized): Secondary | ICD-10-CM | POA: Diagnosis not present

## 2014-10-10 DIAGNOSIS — I6990 Unspecified sequelae of unspecified cerebrovascular disease: Secondary | ICD-10-CM | POA: Diagnosis not present

## 2014-10-10 DIAGNOSIS — I679 Cerebrovascular disease, unspecified: Secondary | ICD-10-CM | POA: Diagnosis not present

## 2014-10-10 DIAGNOSIS — R2689 Other abnormalities of gait and mobility: Secondary | ICD-10-CM | POA: Diagnosis not present

## 2014-10-11 DIAGNOSIS — I679 Cerebrovascular disease, unspecified: Secondary | ICD-10-CM | POA: Diagnosis not present

## 2014-10-11 DIAGNOSIS — M6281 Muscle weakness (generalized): Secondary | ICD-10-CM | POA: Diagnosis not present

## 2014-10-11 DIAGNOSIS — I6990 Unspecified sequelae of unspecified cerebrovascular disease: Secondary | ICD-10-CM | POA: Diagnosis not present

## 2014-10-11 DIAGNOSIS — W1789XA Other fall from one level to another, initial encounter: Secondary | ICD-10-CM | POA: Diagnosis not present

## 2014-10-11 DIAGNOSIS — I6789 Other cerebrovascular disease: Secondary | ICD-10-CM | POA: Diagnosis not present

## 2014-10-11 DIAGNOSIS — R2689 Other abnormalities of gait and mobility: Secondary | ICD-10-CM | POA: Diagnosis not present

## 2014-10-12 DIAGNOSIS — W1789XA Other fall from one level to another, initial encounter: Secondary | ICD-10-CM | POA: Diagnosis not present

## 2014-10-12 DIAGNOSIS — I6990 Unspecified sequelae of unspecified cerebrovascular disease: Secondary | ICD-10-CM | POA: Diagnosis not present

## 2014-10-12 DIAGNOSIS — M6281 Muscle weakness (generalized): Secondary | ICD-10-CM | POA: Diagnosis not present

## 2014-10-12 DIAGNOSIS — I679 Cerebrovascular disease, unspecified: Secondary | ICD-10-CM | POA: Diagnosis not present

## 2014-10-12 DIAGNOSIS — I6789 Other cerebrovascular disease: Secondary | ICD-10-CM | POA: Diagnosis not present

## 2014-10-12 DIAGNOSIS — R2689 Other abnormalities of gait and mobility: Secondary | ICD-10-CM | POA: Diagnosis not present

## 2014-10-13 DIAGNOSIS — I679 Cerebrovascular disease, unspecified: Secondary | ICD-10-CM | POA: Diagnosis not present

## 2014-10-13 DIAGNOSIS — W1789XA Other fall from one level to another, initial encounter: Secondary | ICD-10-CM | POA: Diagnosis not present

## 2014-10-13 DIAGNOSIS — M6281 Muscle weakness (generalized): Secondary | ICD-10-CM | POA: Diagnosis not present

## 2014-10-13 DIAGNOSIS — I6789 Other cerebrovascular disease: Secondary | ICD-10-CM | POA: Diagnosis not present

## 2014-10-13 DIAGNOSIS — N289 Disorder of kidney and ureter, unspecified: Secondary | ICD-10-CM | POA: Diagnosis not present

## 2014-10-13 DIAGNOSIS — R2689 Other abnormalities of gait and mobility: Secondary | ICD-10-CM | POA: Diagnosis not present

## 2014-10-13 DIAGNOSIS — R569 Unspecified convulsions: Secondary | ICD-10-CM | POA: Diagnosis not present

## 2014-10-13 DIAGNOSIS — D649 Anemia, unspecified: Secondary | ICD-10-CM | POA: Diagnosis not present

## 2014-10-13 DIAGNOSIS — I6990 Unspecified sequelae of unspecified cerebrovascular disease: Secondary | ICD-10-CM | POA: Diagnosis not present

## 2014-10-13 DIAGNOSIS — Z79899 Other long term (current) drug therapy: Secondary | ICD-10-CM | POA: Diagnosis not present

## 2014-10-16 DIAGNOSIS — I6990 Unspecified sequelae of unspecified cerebrovascular disease: Secondary | ICD-10-CM | POA: Diagnosis not present

## 2014-10-16 DIAGNOSIS — I6789 Other cerebrovascular disease: Secondary | ICD-10-CM | POA: Diagnosis not present

## 2014-10-16 DIAGNOSIS — M6281 Muscle weakness (generalized): Secondary | ICD-10-CM | POA: Diagnosis not present

## 2014-10-16 DIAGNOSIS — W1789XA Other fall from one level to another, initial encounter: Secondary | ICD-10-CM | POA: Diagnosis not present

## 2014-10-16 DIAGNOSIS — Z9181 History of falling: Secondary | ICD-10-CM | POA: Diagnosis not present

## 2014-10-16 DIAGNOSIS — I679 Cerebrovascular disease, unspecified: Secondary | ICD-10-CM | POA: Diagnosis not present

## 2014-10-16 DIAGNOSIS — R2689 Other abnormalities of gait and mobility: Secondary | ICD-10-CM | POA: Diagnosis not present

## 2014-10-17 DIAGNOSIS — R2689 Other abnormalities of gait and mobility: Secondary | ICD-10-CM | POA: Diagnosis not present

## 2014-10-17 DIAGNOSIS — I6990 Unspecified sequelae of unspecified cerebrovascular disease: Secondary | ICD-10-CM | POA: Diagnosis not present

## 2014-10-17 DIAGNOSIS — I6789 Other cerebrovascular disease: Secondary | ICD-10-CM | POA: Diagnosis not present

## 2014-10-17 DIAGNOSIS — W1789XA Other fall from one level to another, initial encounter: Secondary | ICD-10-CM | POA: Diagnosis not present

## 2014-10-17 DIAGNOSIS — I679 Cerebrovascular disease, unspecified: Secondary | ICD-10-CM | POA: Diagnosis not present

## 2014-10-17 DIAGNOSIS — M6281 Muscle weakness (generalized): Secondary | ICD-10-CM | POA: Diagnosis not present

## 2014-10-18 DIAGNOSIS — I679 Cerebrovascular disease, unspecified: Secondary | ICD-10-CM | POA: Diagnosis not present

## 2014-10-18 DIAGNOSIS — R2689 Other abnormalities of gait and mobility: Secondary | ICD-10-CM | POA: Diagnosis not present

## 2014-10-18 DIAGNOSIS — I6789 Other cerebrovascular disease: Secondary | ICD-10-CM | POA: Diagnosis not present

## 2014-10-18 DIAGNOSIS — M6281 Muscle weakness (generalized): Secondary | ICD-10-CM | POA: Diagnosis not present

## 2014-10-18 DIAGNOSIS — I6990 Unspecified sequelae of unspecified cerebrovascular disease: Secondary | ICD-10-CM | POA: Diagnosis not present

## 2014-10-18 DIAGNOSIS — W1789XA Other fall from one level to another, initial encounter: Secondary | ICD-10-CM | POA: Diagnosis not present

## 2014-10-19 DIAGNOSIS — R569 Unspecified convulsions: Secondary | ICD-10-CM | POA: Diagnosis not present

## 2014-10-19 DIAGNOSIS — F324 Major depressive disorder, single episode, in partial remission: Secondary | ICD-10-CM | POA: Diagnosis not present

## 2014-10-19 DIAGNOSIS — I69359 Hemiplegia and hemiparesis following cerebral infarction affecting unspecified side: Secondary | ICD-10-CM | POA: Diagnosis not present

## 2014-10-19 DIAGNOSIS — I6990 Unspecified sequelae of unspecified cerebrovascular disease: Secondary | ICD-10-CM | POA: Diagnosis not present

## 2014-10-19 DIAGNOSIS — R2689 Other abnormalities of gait and mobility: Secondary | ICD-10-CM | POA: Diagnosis not present

## 2014-10-19 DIAGNOSIS — R600 Localized edema: Secondary | ICD-10-CM | POA: Diagnosis not present

## 2014-10-19 DIAGNOSIS — I679 Cerebrovascular disease, unspecified: Secondary | ICD-10-CM | POA: Diagnosis not present

## 2014-10-19 DIAGNOSIS — I10 Essential (primary) hypertension: Secondary | ICD-10-CM | POA: Diagnosis not present

## 2014-10-19 DIAGNOSIS — D649 Anemia, unspecified: Secondary | ICD-10-CM | POA: Diagnosis not present

## 2014-10-19 DIAGNOSIS — Z79899 Other long term (current) drug therapy: Secondary | ICD-10-CM | POA: Diagnosis not present

## 2014-10-19 DIAGNOSIS — I82401 Acute embolism and thrombosis of unspecified deep veins of right lower extremity: Secondary | ICD-10-CM | POA: Diagnosis not present

## 2014-10-19 DIAGNOSIS — I6789 Other cerebrovascular disease: Secondary | ICD-10-CM | POA: Diagnosis not present

## 2014-10-19 DIAGNOSIS — W1789XA Other fall from one level to another, initial encounter: Secondary | ICD-10-CM | POA: Diagnosis not present

## 2014-10-19 DIAGNOSIS — M6281 Muscle weakness (generalized): Secondary | ICD-10-CM | POA: Diagnosis not present

## 2014-10-19 DIAGNOSIS — G40909 Epilepsy, unspecified, not intractable, without status epilepticus: Secondary | ICD-10-CM | POA: Diagnosis not present

## 2014-10-20 DIAGNOSIS — I679 Cerebrovascular disease, unspecified: Secondary | ICD-10-CM | POA: Diagnosis not present

## 2014-10-20 DIAGNOSIS — R2689 Other abnormalities of gait and mobility: Secondary | ICD-10-CM | POA: Diagnosis not present

## 2014-10-20 DIAGNOSIS — W1789XA Other fall from one level to another, initial encounter: Secondary | ICD-10-CM | POA: Diagnosis not present

## 2014-10-20 DIAGNOSIS — I6789 Other cerebrovascular disease: Secondary | ICD-10-CM | POA: Diagnosis not present

## 2014-10-20 DIAGNOSIS — M6281 Muscle weakness (generalized): Secondary | ICD-10-CM | POA: Diagnosis not present

## 2014-10-20 DIAGNOSIS — I6990 Unspecified sequelae of unspecified cerebrovascular disease: Secondary | ICD-10-CM | POA: Diagnosis not present

## 2014-11-07 DIAGNOSIS — M79621 Pain in right upper arm: Secondary | ICD-10-CM | POA: Diagnosis not present

## 2014-11-07 DIAGNOSIS — M79631 Pain in right forearm: Secondary | ICD-10-CM | POA: Diagnosis not present

## 2014-11-07 DIAGNOSIS — M79651 Pain in right thigh: Secondary | ICD-10-CM | POA: Diagnosis not present

## 2014-11-07 DIAGNOSIS — M79661 Pain in right lower leg: Secondary | ICD-10-CM | POA: Diagnosis not present

## 2014-11-10 DIAGNOSIS — M6281 Muscle weakness (generalized): Secondary | ICD-10-CM | POA: Diagnosis not present

## 2014-11-10 DIAGNOSIS — Z9181 History of falling: Secondary | ICD-10-CM | POA: Diagnosis not present

## 2014-11-10 DIAGNOSIS — I6990 Unspecified sequelae of unspecified cerebrovascular disease: Secondary | ICD-10-CM | POA: Diagnosis not present

## 2014-11-16 DIAGNOSIS — E780 Pure hypercholesterolemia: Secondary | ICD-10-CM | POA: Diagnosis not present

## 2014-11-16 DIAGNOSIS — G40909 Epilepsy, unspecified, not intractable, without status epilepticus: Secondary | ICD-10-CM | POA: Diagnosis not present

## 2014-11-16 DIAGNOSIS — I69359 Hemiplegia and hemiparesis following cerebral infarction affecting unspecified side: Secondary | ICD-10-CM | POA: Diagnosis not present

## 2014-11-16 DIAGNOSIS — F322 Major depressive disorder, single episode, severe without psychotic features: Secondary | ICD-10-CM | POA: Diagnosis not present

## 2015-01-18 DIAGNOSIS — F0390 Unspecified dementia without behavioral disturbance: Secondary | ICD-10-CM | POA: Diagnosis not present

## 2015-01-18 DIAGNOSIS — G40909 Epilepsy, unspecified, not intractable, without status epilepticus: Secondary | ICD-10-CM | POA: Diagnosis not present

## 2015-01-18 DIAGNOSIS — I10 Essential (primary) hypertension: Secondary | ICD-10-CM | POA: Diagnosis not present

## 2015-01-18 DIAGNOSIS — F322 Major depressive disorder, single episode, severe without psychotic features: Secondary | ICD-10-CM | POA: Diagnosis not present

## 2015-01-18 DIAGNOSIS — I69359 Hemiplegia and hemiparesis following cerebral infarction affecting unspecified side: Secondary | ICD-10-CM | POA: Diagnosis not present

## 2015-01-23 DIAGNOSIS — H5203 Hypermetropia, bilateral: Secondary | ICD-10-CM | POA: Diagnosis not present

## 2015-01-23 DIAGNOSIS — Z7901 Long term (current) use of anticoagulants: Secondary | ICD-10-CM | POA: Diagnosis not present

## 2015-01-23 DIAGNOSIS — H2513 Age-related nuclear cataract, bilateral: Secondary | ICD-10-CM | POA: Diagnosis not present

## 2015-02-15 DIAGNOSIS — M6281 Muscle weakness (generalized): Secondary | ICD-10-CM | POA: Diagnosis not present

## 2015-02-15 DIAGNOSIS — R1311 Dysphagia, oral phase: Secondary | ICD-10-CM | POA: Diagnosis not present

## 2015-02-22 DIAGNOSIS — I69359 Hemiplegia and hemiparesis following cerebral infarction affecting unspecified side: Secondary | ICD-10-CM | POA: Diagnosis not present

## 2015-02-22 DIAGNOSIS — F322 Major depressive disorder, single episode, severe without psychotic features: Secondary | ICD-10-CM | POA: Diagnosis not present

## 2015-02-22 DIAGNOSIS — R600 Localized edema: Secondary | ICD-10-CM | POA: Diagnosis not present

## 2015-02-22 DIAGNOSIS — G40909 Epilepsy, unspecified, not intractable, without status epilepticus: Secondary | ICD-10-CM | POA: Diagnosis not present

## 2015-02-22 DIAGNOSIS — F0391 Unspecified dementia with behavioral disturbance: Secondary | ICD-10-CM | POA: Diagnosis not present

## 2015-02-22 DIAGNOSIS — I82401 Acute embolism and thrombosis of unspecified deep veins of right lower extremity: Secondary | ICD-10-CM | POA: Diagnosis not present

## 2015-03-07 DIAGNOSIS — M6281 Muscle weakness (generalized): Secondary | ICD-10-CM | POA: Diagnosis not present

## 2015-03-07 DIAGNOSIS — R1311 Dysphagia, oral phase: Secondary | ICD-10-CM | POA: Diagnosis not present

## 2015-03-29 DIAGNOSIS — I69359 Hemiplegia and hemiparesis following cerebral infarction affecting unspecified side: Secondary | ICD-10-CM | POA: Diagnosis not present

## 2015-03-29 DIAGNOSIS — R1310 Dysphagia, unspecified: Secondary | ICD-10-CM | POA: Diagnosis not present

## 2015-03-29 DIAGNOSIS — I82401 Acute embolism and thrombosis of unspecified deep veins of right lower extremity: Secondary | ICD-10-CM | POA: Diagnosis not present

## 2015-03-29 DIAGNOSIS — I10 Essential (primary) hypertension: Secondary | ICD-10-CM | POA: Diagnosis not present

## 2015-03-29 DIAGNOSIS — G40909 Epilepsy, unspecified, not intractable, without status epilepticus: Secondary | ICD-10-CM | POA: Diagnosis not present

## 2015-04-04 DIAGNOSIS — R2689 Other abnormalities of gait and mobility: Secondary | ICD-10-CM | POA: Diagnosis not present

## 2015-04-04 DIAGNOSIS — M6281 Muscle weakness (generalized): Secondary | ICD-10-CM | POA: Diagnosis not present

## 2015-04-05 DIAGNOSIS — M6281 Muscle weakness (generalized): Secondary | ICD-10-CM | POA: Diagnosis not present

## 2015-04-05 DIAGNOSIS — R2689 Other abnormalities of gait and mobility: Secondary | ICD-10-CM | POA: Diagnosis not present

## 2015-04-06 DIAGNOSIS — M6281 Muscle weakness (generalized): Secondary | ICD-10-CM | POA: Diagnosis not present

## 2015-04-06 DIAGNOSIS — R2689 Other abnormalities of gait and mobility: Secondary | ICD-10-CM | POA: Diagnosis not present

## 2015-04-09 DIAGNOSIS — M6281 Muscle weakness (generalized): Secondary | ICD-10-CM | POA: Diagnosis not present

## 2015-04-09 DIAGNOSIS — R2689 Other abnormalities of gait and mobility: Secondary | ICD-10-CM | POA: Diagnosis not present

## 2015-04-10 DIAGNOSIS — M6281 Muscle weakness (generalized): Secondary | ICD-10-CM | POA: Diagnosis not present

## 2015-04-10 DIAGNOSIS — R2689 Other abnormalities of gait and mobility: Secondary | ICD-10-CM | POA: Diagnosis not present

## 2015-04-11 DIAGNOSIS — M6281 Muscle weakness (generalized): Secondary | ICD-10-CM | POA: Diagnosis not present

## 2015-04-11 DIAGNOSIS — R2689 Other abnormalities of gait and mobility: Secondary | ICD-10-CM | POA: Diagnosis not present

## 2015-04-12 DIAGNOSIS — Z79899 Other long term (current) drug therapy: Secondary | ICD-10-CM | POA: Diagnosis not present

## 2015-04-12 DIAGNOSIS — D649 Anemia, unspecified: Secondary | ICD-10-CM | POA: Diagnosis not present

## 2015-04-12 DIAGNOSIS — N289 Disorder of kidney and ureter, unspecified: Secondary | ICD-10-CM | POA: Diagnosis not present

## 2015-04-12 DIAGNOSIS — M6281 Muscle weakness (generalized): Secondary | ICD-10-CM | POA: Diagnosis not present

## 2015-04-12 DIAGNOSIS — R569 Unspecified convulsions: Secondary | ICD-10-CM | POA: Diagnosis not present

## 2015-04-12 DIAGNOSIS — R2689 Other abnormalities of gait and mobility: Secondary | ICD-10-CM | POA: Diagnosis not present

## 2015-04-13 DIAGNOSIS — R2689 Other abnormalities of gait and mobility: Secondary | ICD-10-CM | POA: Diagnosis not present

## 2015-04-13 DIAGNOSIS — M6281 Muscle weakness (generalized): Secondary | ICD-10-CM | POA: Diagnosis not present

## 2015-04-14 DIAGNOSIS — M6281 Muscle weakness (generalized): Secondary | ICD-10-CM | POA: Diagnosis not present

## 2015-04-14 DIAGNOSIS — R2689 Other abnormalities of gait and mobility: Secondary | ICD-10-CM | POA: Diagnosis not present

## 2015-04-16 DIAGNOSIS — R2689 Other abnormalities of gait and mobility: Secondary | ICD-10-CM | POA: Diagnosis not present

## 2015-04-16 DIAGNOSIS — M6281 Muscle weakness (generalized): Secondary | ICD-10-CM | POA: Diagnosis not present

## 2015-04-17 DIAGNOSIS — M6281 Muscle weakness (generalized): Secondary | ICD-10-CM | POA: Diagnosis not present

## 2015-04-17 DIAGNOSIS — R2689 Other abnormalities of gait and mobility: Secondary | ICD-10-CM | POA: Diagnosis not present

## 2015-04-18 DIAGNOSIS — M6281 Muscle weakness (generalized): Secondary | ICD-10-CM | POA: Diagnosis not present

## 2015-04-18 DIAGNOSIS — R2689 Other abnormalities of gait and mobility: Secondary | ICD-10-CM | POA: Diagnosis not present

## 2015-04-19 DIAGNOSIS — M6281 Muscle weakness (generalized): Secondary | ICD-10-CM | POA: Diagnosis not present

## 2015-04-19 DIAGNOSIS — D649 Anemia, unspecified: Secondary | ICD-10-CM | POA: Diagnosis not present

## 2015-04-19 DIAGNOSIS — R2689 Other abnormalities of gait and mobility: Secondary | ICD-10-CM | POA: Diagnosis not present

## 2015-04-19 DIAGNOSIS — R569 Unspecified convulsions: Secondary | ICD-10-CM | POA: Diagnosis not present

## 2015-04-19 DIAGNOSIS — Z79899 Other long term (current) drug therapy: Secondary | ICD-10-CM | POA: Diagnosis not present

## 2015-04-19 DIAGNOSIS — E7801 Familial hypercholesterolemia: Secondary | ICD-10-CM | POA: Diagnosis not present

## 2015-04-20 DIAGNOSIS — R2689 Other abnormalities of gait and mobility: Secondary | ICD-10-CM | POA: Diagnosis not present

## 2015-04-20 DIAGNOSIS — M6281 Muscle weakness (generalized): Secondary | ICD-10-CM | POA: Diagnosis not present

## 2015-04-23 DIAGNOSIS — R2689 Other abnormalities of gait and mobility: Secondary | ICD-10-CM | POA: Diagnosis not present

## 2015-04-23 DIAGNOSIS — M6281 Muscle weakness (generalized): Secondary | ICD-10-CM | POA: Diagnosis not present

## 2015-04-24 DIAGNOSIS — R2689 Other abnormalities of gait and mobility: Secondary | ICD-10-CM | POA: Diagnosis not present

## 2015-04-24 DIAGNOSIS — M6281 Muscle weakness (generalized): Secondary | ICD-10-CM | POA: Diagnosis not present

## 2015-04-25 DIAGNOSIS — M6281 Muscle weakness (generalized): Secondary | ICD-10-CM | POA: Diagnosis not present

## 2015-04-25 DIAGNOSIS — R2689 Other abnormalities of gait and mobility: Secondary | ICD-10-CM | POA: Diagnosis not present

## 2015-04-26 DIAGNOSIS — M6281 Muscle weakness (generalized): Secondary | ICD-10-CM | POA: Diagnosis not present

## 2015-04-26 DIAGNOSIS — R2689 Other abnormalities of gait and mobility: Secondary | ICD-10-CM | POA: Diagnosis not present

## 2015-04-27 DIAGNOSIS — M6281 Muscle weakness (generalized): Secondary | ICD-10-CM | POA: Diagnosis not present

## 2015-04-27 DIAGNOSIS — R2689 Other abnormalities of gait and mobility: Secondary | ICD-10-CM | POA: Diagnosis not present

## 2015-04-28 DIAGNOSIS — R2689 Other abnormalities of gait and mobility: Secondary | ICD-10-CM | POA: Diagnosis not present

## 2015-04-28 DIAGNOSIS — M6281 Muscle weakness (generalized): Secondary | ICD-10-CM | POA: Diagnosis not present

## 2015-04-29 DIAGNOSIS — R2689 Other abnormalities of gait and mobility: Secondary | ICD-10-CM | POA: Diagnosis not present

## 2015-04-29 DIAGNOSIS — M6281 Muscle weakness (generalized): Secondary | ICD-10-CM | POA: Diagnosis not present

## 2015-04-30 DIAGNOSIS — M6281 Muscle weakness (generalized): Secondary | ICD-10-CM | POA: Diagnosis not present

## 2015-04-30 DIAGNOSIS — R2689 Other abnormalities of gait and mobility: Secondary | ICD-10-CM | POA: Diagnosis not present

## 2015-05-01 DIAGNOSIS — M6281 Muscle weakness (generalized): Secondary | ICD-10-CM | POA: Diagnosis not present

## 2015-05-01 DIAGNOSIS — R2689 Other abnormalities of gait and mobility: Secondary | ICD-10-CM | POA: Diagnosis not present

## 2015-05-02 DIAGNOSIS — R2689 Other abnormalities of gait and mobility: Secondary | ICD-10-CM | POA: Diagnosis not present

## 2015-05-02 DIAGNOSIS — M6281 Muscle weakness (generalized): Secondary | ICD-10-CM | POA: Diagnosis not present

## 2015-05-03 DIAGNOSIS — R2689 Other abnormalities of gait and mobility: Secondary | ICD-10-CM | POA: Diagnosis not present

## 2015-05-03 DIAGNOSIS — M6281 Muscle weakness (generalized): Secondary | ICD-10-CM | POA: Diagnosis not present

## 2015-05-04 DIAGNOSIS — R2689 Other abnormalities of gait and mobility: Secondary | ICD-10-CM | POA: Diagnosis not present

## 2015-05-04 DIAGNOSIS — M6281 Muscle weakness (generalized): Secondary | ICD-10-CM | POA: Diagnosis not present

## 2015-05-05 DIAGNOSIS — R2689 Other abnormalities of gait and mobility: Secondary | ICD-10-CM | POA: Diagnosis not present

## 2015-05-05 DIAGNOSIS — M6281 Muscle weakness (generalized): Secondary | ICD-10-CM | POA: Diagnosis not present

## 2015-05-07 DIAGNOSIS — R2689 Other abnormalities of gait and mobility: Secondary | ICD-10-CM | POA: Diagnosis not present

## 2015-05-07 DIAGNOSIS — M6281 Muscle weakness (generalized): Secondary | ICD-10-CM | POA: Diagnosis not present

## 2015-05-08 DIAGNOSIS — R2689 Other abnormalities of gait and mobility: Secondary | ICD-10-CM | POA: Diagnosis not present

## 2015-05-08 DIAGNOSIS — M6281 Muscle weakness (generalized): Secondary | ICD-10-CM | POA: Diagnosis not present

## 2015-05-09 DIAGNOSIS — R2689 Other abnormalities of gait and mobility: Secondary | ICD-10-CM | POA: Diagnosis not present

## 2015-05-09 DIAGNOSIS — M6281 Muscle weakness (generalized): Secondary | ICD-10-CM | POA: Diagnosis not present

## 2015-05-10 DIAGNOSIS — M6281 Muscle weakness (generalized): Secondary | ICD-10-CM | POA: Diagnosis not present

## 2015-05-10 DIAGNOSIS — I10 Essential (primary) hypertension: Secondary | ICD-10-CM | POA: Diagnosis not present

## 2015-05-10 DIAGNOSIS — F322 Major depressive disorder, single episode, severe without psychotic features: Secondary | ICD-10-CM | POA: Diagnosis not present

## 2015-05-10 DIAGNOSIS — I69359 Hemiplegia and hemiparesis following cerebral infarction affecting unspecified side: Secondary | ICD-10-CM | POA: Diagnosis not present

## 2015-05-10 DIAGNOSIS — I82401 Acute embolism and thrombosis of unspecified deep veins of right lower extremity: Secondary | ICD-10-CM | POA: Diagnosis not present

## 2015-05-10 DIAGNOSIS — G40909 Epilepsy, unspecified, not intractable, without status epilepticus: Secondary | ICD-10-CM | POA: Diagnosis not present

## 2015-05-10 DIAGNOSIS — R1310 Dysphagia, unspecified: Secondary | ICD-10-CM | POA: Diagnosis not present

## 2015-05-10 DIAGNOSIS — R2689 Other abnormalities of gait and mobility: Secondary | ICD-10-CM | POA: Diagnosis not present

## 2015-05-11 DIAGNOSIS — M6281 Muscle weakness (generalized): Secondary | ICD-10-CM | POA: Diagnosis not present

## 2015-05-11 DIAGNOSIS — R2689 Other abnormalities of gait and mobility: Secondary | ICD-10-CM | POA: Diagnosis not present

## 2015-05-14 DIAGNOSIS — M6281 Muscle weakness (generalized): Secondary | ICD-10-CM | POA: Diagnosis not present

## 2015-05-14 DIAGNOSIS — R2689 Other abnormalities of gait and mobility: Secondary | ICD-10-CM | POA: Diagnosis not present

## 2015-05-15 DIAGNOSIS — R2689 Other abnormalities of gait and mobility: Secondary | ICD-10-CM | POA: Diagnosis not present

## 2015-05-15 DIAGNOSIS — M6281 Muscle weakness (generalized): Secondary | ICD-10-CM | POA: Diagnosis not present

## 2015-05-16 DIAGNOSIS — R2689 Other abnormalities of gait and mobility: Secondary | ICD-10-CM | POA: Diagnosis not present

## 2015-05-16 DIAGNOSIS — M6281 Muscle weakness (generalized): Secondary | ICD-10-CM | POA: Diagnosis not present

## 2015-05-17 DIAGNOSIS — M6281 Muscle weakness (generalized): Secondary | ICD-10-CM | POA: Diagnosis not present

## 2015-05-17 DIAGNOSIS — R2689 Other abnormalities of gait and mobility: Secondary | ICD-10-CM | POA: Diagnosis not present

## 2015-05-18 DIAGNOSIS — R2689 Other abnormalities of gait and mobility: Secondary | ICD-10-CM | POA: Diagnosis not present

## 2015-05-18 DIAGNOSIS — M6281 Muscle weakness (generalized): Secondary | ICD-10-CM | POA: Diagnosis not present

## 2015-05-21 DIAGNOSIS — M6281 Muscle weakness (generalized): Secondary | ICD-10-CM | POA: Diagnosis not present

## 2015-05-21 DIAGNOSIS — R2689 Other abnormalities of gait and mobility: Secondary | ICD-10-CM | POA: Diagnosis not present

## 2015-05-22 DIAGNOSIS — R2689 Other abnormalities of gait and mobility: Secondary | ICD-10-CM | POA: Diagnosis not present

## 2015-05-22 DIAGNOSIS — M6281 Muscle weakness (generalized): Secondary | ICD-10-CM | POA: Diagnosis not present

## 2015-05-23 DIAGNOSIS — R2689 Other abnormalities of gait and mobility: Secondary | ICD-10-CM | POA: Diagnosis not present

## 2015-05-23 DIAGNOSIS — M6281 Muscle weakness (generalized): Secondary | ICD-10-CM | POA: Diagnosis not present

## 2015-05-24 DIAGNOSIS — R2689 Other abnormalities of gait and mobility: Secondary | ICD-10-CM | POA: Diagnosis not present

## 2015-05-24 DIAGNOSIS — M6281 Muscle weakness (generalized): Secondary | ICD-10-CM | POA: Diagnosis not present

## 2015-05-25 DIAGNOSIS — M6281 Muscle weakness (generalized): Secondary | ICD-10-CM | POA: Diagnosis not present

## 2015-05-25 DIAGNOSIS — R2689 Other abnormalities of gait and mobility: Secondary | ICD-10-CM | POA: Diagnosis not present

## 2015-05-28 DIAGNOSIS — M6281 Muscle weakness (generalized): Secondary | ICD-10-CM | POA: Diagnosis not present

## 2015-05-28 DIAGNOSIS — R2689 Other abnormalities of gait and mobility: Secondary | ICD-10-CM | POA: Diagnosis not present

## 2015-05-29 DIAGNOSIS — M6281 Muscle weakness (generalized): Secondary | ICD-10-CM | POA: Diagnosis not present

## 2015-05-29 DIAGNOSIS — R2689 Other abnormalities of gait and mobility: Secondary | ICD-10-CM | POA: Diagnosis not present

## 2015-05-30 DIAGNOSIS — R2689 Other abnormalities of gait and mobility: Secondary | ICD-10-CM | POA: Diagnosis not present

## 2015-05-30 DIAGNOSIS — M6281 Muscle weakness (generalized): Secondary | ICD-10-CM | POA: Diagnosis not present

## 2015-05-31 DIAGNOSIS — M6281 Muscle weakness (generalized): Secondary | ICD-10-CM | POA: Diagnosis not present

## 2015-05-31 DIAGNOSIS — R2689 Other abnormalities of gait and mobility: Secondary | ICD-10-CM | POA: Diagnosis not present

## 2015-06-04 DIAGNOSIS — M6281 Muscle weakness (generalized): Secondary | ICD-10-CM | POA: Diagnosis not present

## 2015-06-04 DIAGNOSIS — R2689 Other abnormalities of gait and mobility: Secondary | ICD-10-CM | POA: Diagnosis not present

## 2015-06-05 DIAGNOSIS — M6281 Muscle weakness (generalized): Secondary | ICD-10-CM | POA: Diagnosis not present

## 2015-06-05 DIAGNOSIS — R2689 Other abnormalities of gait and mobility: Secondary | ICD-10-CM | POA: Diagnosis not present

## 2015-06-21 DIAGNOSIS — I10 Essential (primary) hypertension: Secondary | ICD-10-CM | POA: Diagnosis not present

## 2015-06-21 DIAGNOSIS — F322 Major depressive disorder, single episode, severe without psychotic features: Secondary | ICD-10-CM | POA: Diagnosis not present

## 2015-06-21 DIAGNOSIS — I82401 Acute embolism and thrombosis of unspecified deep veins of right lower extremity: Secondary | ICD-10-CM | POA: Diagnosis not present

## 2015-06-21 DIAGNOSIS — G40909 Epilepsy, unspecified, not intractable, without status epilepticus: Secondary | ICD-10-CM | POA: Diagnosis not present

## 2015-06-21 DIAGNOSIS — I69359 Hemiplegia and hemiparesis following cerebral infarction affecting unspecified side: Secondary | ICD-10-CM | POA: Diagnosis not present

## 2015-06-27 DIAGNOSIS — B351 Tinea unguium: Secondary | ICD-10-CM | POA: Diagnosis not present

## 2015-06-27 DIAGNOSIS — M79675 Pain in left toe(s): Secondary | ICD-10-CM | POA: Diagnosis not present

## 2015-06-27 DIAGNOSIS — M79674 Pain in right toe(s): Secondary | ICD-10-CM | POA: Diagnosis not present

## 2015-07-19 DIAGNOSIS — I69359 Hemiplegia and hemiparesis following cerebral infarction affecting unspecified side: Secondary | ICD-10-CM | POA: Diagnosis not present

## 2015-07-19 DIAGNOSIS — K5900 Constipation, unspecified: Secondary | ICD-10-CM | POA: Diagnosis not present

## 2015-07-19 DIAGNOSIS — G40909 Epilepsy, unspecified, not intractable, without status epilepticus: Secondary | ICD-10-CM | POA: Diagnosis not present

## 2015-07-19 DIAGNOSIS — I82401 Acute embolism and thrombosis of unspecified deep veins of right lower extremity: Secondary | ICD-10-CM | POA: Diagnosis not present

## 2015-07-19 DIAGNOSIS — I10 Essential (primary) hypertension: Secondary | ICD-10-CM | POA: Diagnosis not present

## 2015-07-19 DIAGNOSIS — F0391 Unspecified dementia with behavioral disturbance: Secondary | ICD-10-CM | POA: Diagnosis not present

## 2015-07-19 DIAGNOSIS — F322 Major depressive disorder, single episode, severe without psychotic features: Secondary | ICD-10-CM | POA: Diagnosis not present

## 2015-08-08 DIAGNOSIS — I69952 Hemiplegia and hemiparesis following unspecified cerebrovascular disease affecting left dominant side: Secondary | ICD-10-CM | POA: Diagnosis not present

## 2015-08-08 DIAGNOSIS — M6281 Muscle weakness (generalized): Secondary | ICD-10-CM | POA: Diagnosis not present

## 2015-08-08 DIAGNOSIS — I6990 Unspecified sequelae of unspecified cerebrovascular disease: Secondary | ICD-10-CM | POA: Diagnosis not present

## 2015-08-09 DIAGNOSIS — I69952 Hemiplegia and hemiparesis following unspecified cerebrovascular disease affecting left dominant side: Secondary | ICD-10-CM | POA: Diagnosis not present

## 2015-08-09 DIAGNOSIS — I6990 Unspecified sequelae of unspecified cerebrovascular disease: Secondary | ICD-10-CM | POA: Diagnosis not present

## 2015-08-09 DIAGNOSIS — M6281 Muscle weakness (generalized): Secondary | ICD-10-CM | POA: Diagnosis not present

## 2015-08-10 DIAGNOSIS — I6990 Unspecified sequelae of unspecified cerebrovascular disease: Secondary | ICD-10-CM | POA: Diagnosis not present

## 2015-08-10 DIAGNOSIS — I69952 Hemiplegia and hemiparesis following unspecified cerebrovascular disease affecting left dominant side: Secondary | ICD-10-CM | POA: Diagnosis not present

## 2015-08-10 DIAGNOSIS — M6281 Muscle weakness (generalized): Secondary | ICD-10-CM | POA: Diagnosis not present

## 2015-08-13 DIAGNOSIS — M6281 Muscle weakness (generalized): Secondary | ICD-10-CM | POA: Diagnosis not present

## 2015-08-13 DIAGNOSIS — Z7901 Long term (current) use of anticoagulants: Secondary | ICD-10-CM | POA: Diagnosis not present

## 2015-08-13 DIAGNOSIS — H2513 Age-related nuclear cataract, bilateral: Secondary | ICD-10-CM | POA: Diagnosis not present

## 2015-08-13 DIAGNOSIS — I6990 Unspecified sequelae of unspecified cerebrovascular disease: Secondary | ICD-10-CM | POA: Diagnosis not present

## 2015-08-13 DIAGNOSIS — I69952 Hemiplegia and hemiparesis following unspecified cerebrovascular disease affecting left dominant side: Secondary | ICD-10-CM | POA: Diagnosis not present

## 2015-08-16 DIAGNOSIS — I6990 Unspecified sequelae of unspecified cerebrovascular disease: Secondary | ICD-10-CM | POA: Diagnosis not present

## 2015-08-16 DIAGNOSIS — I69952 Hemiplegia and hemiparesis following unspecified cerebrovascular disease affecting left dominant side: Secondary | ICD-10-CM | POA: Diagnosis not present

## 2015-08-16 DIAGNOSIS — M6281 Muscle weakness (generalized): Secondary | ICD-10-CM | POA: Diagnosis not present

## 2015-08-17 DIAGNOSIS — I6990 Unspecified sequelae of unspecified cerebrovascular disease: Secondary | ICD-10-CM | POA: Diagnosis not present

## 2015-08-17 DIAGNOSIS — I69952 Hemiplegia and hemiparesis following unspecified cerebrovascular disease affecting left dominant side: Secondary | ICD-10-CM | POA: Diagnosis not present

## 2015-08-17 DIAGNOSIS — M6281 Muscle weakness (generalized): Secondary | ICD-10-CM | POA: Diagnosis not present

## 2015-08-20 DIAGNOSIS — I6990 Unspecified sequelae of unspecified cerebrovascular disease: Secondary | ICD-10-CM | POA: Diagnosis not present

## 2015-08-20 DIAGNOSIS — I69952 Hemiplegia and hemiparesis following unspecified cerebrovascular disease affecting left dominant side: Secondary | ICD-10-CM | POA: Diagnosis not present

## 2015-08-20 DIAGNOSIS — M6281 Muscle weakness (generalized): Secondary | ICD-10-CM | POA: Diagnosis not present

## 2015-08-21 DIAGNOSIS — M6281 Muscle weakness (generalized): Secondary | ICD-10-CM | POA: Diagnosis not present

## 2015-08-21 DIAGNOSIS — I6990 Unspecified sequelae of unspecified cerebrovascular disease: Secondary | ICD-10-CM | POA: Diagnosis not present

## 2015-08-21 DIAGNOSIS — I69952 Hemiplegia and hemiparesis following unspecified cerebrovascular disease affecting left dominant side: Secondary | ICD-10-CM | POA: Diagnosis not present

## 2015-08-22 DIAGNOSIS — I69952 Hemiplegia and hemiparesis following unspecified cerebrovascular disease affecting left dominant side: Secondary | ICD-10-CM | POA: Diagnosis not present

## 2015-08-22 DIAGNOSIS — I6990 Unspecified sequelae of unspecified cerebrovascular disease: Secondary | ICD-10-CM | POA: Diagnosis not present

## 2015-08-22 DIAGNOSIS — M6281 Muscle weakness (generalized): Secondary | ICD-10-CM | POA: Diagnosis not present

## 2015-08-23 DIAGNOSIS — F322 Major depressive disorder, single episode, severe without psychotic features: Secondary | ICD-10-CM | POA: Diagnosis not present

## 2015-08-23 DIAGNOSIS — I82401 Acute embolism and thrombosis of unspecified deep veins of right lower extremity: Secondary | ICD-10-CM | POA: Diagnosis not present

## 2015-08-23 DIAGNOSIS — I6990 Unspecified sequelae of unspecified cerebrovascular disease: Secondary | ICD-10-CM | POA: Diagnosis not present

## 2015-08-23 DIAGNOSIS — I10 Essential (primary) hypertension: Secondary | ICD-10-CM | POA: Diagnosis not present

## 2015-08-23 DIAGNOSIS — G40909 Epilepsy, unspecified, not intractable, without status epilepticus: Secondary | ICD-10-CM | POA: Diagnosis not present

## 2015-08-23 DIAGNOSIS — M6281 Muscle weakness (generalized): Secondary | ICD-10-CM | POA: Diagnosis not present

## 2015-08-23 DIAGNOSIS — I69359 Hemiplegia and hemiparesis following cerebral infarction affecting unspecified side: Secondary | ICD-10-CM | POA: Diagnosis not present

## 2015-08-23 DIAGNOSIS — I69952 Hemiplegia and hemiparesis following unspecified cerebrovascular disease affecting left dominant side: Secondary | ICD-10-CM | POA: Diagnosis not present

## 2015-08-23 DIAGNOSIS — F0391 Unspecified dementia with behavioral disturbance: Secondary | ICD-10-CM | POA: Diagnosis not present

## 2015-09-20 DIAGNOSIS — I69359 Hemiplegia and hemiparesis following cerebral infarction affecting unspecified side: Secondary | ICD-10-CM | POA: Diagnosis not present

## 2015-09-20 DIAGNOSIS — G40909 Epilepsy, unspecified, not intractable, without status epilepticus: Secondary | ICD-10-CM | POA: Diagnosis not present

## 2015-09-20 DIAGNOSIS — F322 Major depressive disorder, single episode, severe without psychotic features: Secondary | ICD-10-CM | POA: Diagnosis not present

## 2015-09-20 DIAGNOSIS — I82401 Acute embolism and thrombosis of unspecified deep veins of right lower extremity: Secondary | ICD-10-CM | POA: Diagnosis not present

## 2015-09-20 DIAGNOSIS — I10 Essential (primary) hypertension: Secondary | ICD-10-CM | POA: Diagnosis not present

## 2015-09-20 DIAGNOSIS — S40022A Contusion of left upper arm, initial encounter: Secondary | ICD-10-CM | POA: Diagnosis not present

## 2015-10-04 DIAGNOSIS — Z993 Dependence on wheelchair: Secondary | ICD-10-CM | POA: Diagnosis not present

## 2015-10-04 DIAGNOSIS — I69952 Hemiplegia and hemiparesis following unspecified cerebrovascular disease affecting left dominant side: Secondary | ICD-10-CM | POA: Diagnosis not present

## 2015-10-05 DIAGNOSIS — Z993 Dependence on wheelchair: Secondary | ICD-10-CM | POA: Diagnosis not present

## 2015-10-05 DIAGNOSIS — I69952 Hemiplegia and hemiparesis following unspecified cerebrovascular disease affecting left dominant side: Secondary | ICD-10-CM | POA: Diagnosis not present

## 2015-10-08 DIAGNOSIS — I69952 Hemiplegia and hemiparesis following unspecified cerebrovascular disease affecting left dominant side: Secondary | ICD-10-CM | POA: Diagnosis not present

## 2015-10-08 DIAGNOSIS — M6281 Muscle weakness (generalized): Secondary | ICD-10-CM | POA: Diagnosis not present

## 2015-10-08 DIAGNOSIS — Z993 Dependence on wheelchair: Secondary | ICD-10-CM | POA: Diagnosis not present

## 2015-10-08 DIAGNOSIS — W050XXD Fall from non-moving wheelchair, subsequent encounter: Secondary | ICD-10-CM | POA: Diagnosis not present

## 2015-10-10 DIAGNOSIS — I69952 Hemiplegia and hemiparesis following unspecified cerebrovascular disease affecting left dominant side: Secondary | ICD-10-CM | POA: Diagnosis not present

## 2015-10-10 DIAGNOSIS — M6281 Muscle weakness (generalized): Secondary | ICD-10-CM | POA: Diagnosis not present

## 2015-10-10 DIAGNOSIS — Z993 Dependence on wheelchair: Secondary | ICD-10-CM | POA: Diagnosis not present

## 2015-10-10 DIAGNOSIS — W050XXD Fall from non-moving wheelchair, subsequent encounter: Secondary | ICD-10-CM | POA: Diagnosis not present

## 2015-10-12 DIAGNOSIS — D649 Anemia, unspecified: Secondary | ICD-10-CM | POA: Diagnosis not present

## 2015-10-12 DIAGNOSIS — Z79899 Other long term (current) drug therapy: Secondary | ICD-10-CM | POA: Diagnosis not present

## 2015-10-12 DIAGNOSIS — N289 Disorder of kidney and ureter, unspecified: Secondary | ICD-10-CM | POA: Diagnosis not present

## 2015-10-12 DIAGNOSIS — R569 Unspecified convulsions: Secondary | ICD-10-CM | POA: Diagnosis not present

## 2015-10-17 DIAGNOSIS — M79622 Pain in left upper arm: Secondary | ICD-10-CM | POA: Diagnosis not present

## 2015-10-17 DIAGNOSIS — M7981 Nontraumatic hematoma of soft tissue: Secondary | ICD-10-CM | POA: Diagnosis not present

## 2015-10-18 DIAGNOSIS — Z79899 Other long term (current) drug therapy: Secondary | ICD-10-CM | POA: Diagnosis not present

## 2015-10-18 DIAGNOSIS — R569 Unspecified convulsions: Secondary | ICD-10-CM | POA: Diagnosis not present

## 2015-10-18 DIAGNOSIS — D649 Anemia, unspecified: Secondary | ICD-10-CM | POA: Diagnosis not present

## 2015-10-18 DIAGNOSIS — E7801 Familial hypercholesterolemia: Secondary | ICD-10-CM | POA: Diagnosis not present

## 2015-11-01 DIAGNOSIS — G40909 Epilepsy, unspecified, not intractable, without status epilepticus: Secondary | ICD-10-CM | POA: Diagnosis not present

## 2015-11-01 DIAGNOSIS — I69359 Hemiplegia and hemiparesis following cerebral infarction affecting unspecified side: Secondary | ICD-10-CM | POA: Diagnosis not present

## 2015-11-01 DIAGNOSIS — I82401 Acute embolism and thrombosis of unspecified deep veins of right lower extremity: Secondary | ICD-10-CM | POA: Diagnosis not present

## 2015-11-01 DIAGNOSIS — F0391 Unspecified dementia with behavioral disturbance: Secondary | ICD-10-CM | POA: Diagnosis not present

## 2015-11-02 DIAGNOSIS — M6281 Muscle weakness (generalized): Secondary | ICD-10-CM | POA: Diagnosis not present

## 2015-11-02 DIAGNOSIS — W050XXD Fall from non-moving wheelchair, subsequent encounter: Secondary | ICD-10-CM | POA: Diagnosis not present

## 2015-11-02 DIAGNOSIS — I69952 Hemiplegia and hemiparesis following unspecified cerebrovascular disease affecting left dominant side: Secondary | ICD-10-CM | POA: Diagnosis not present

## 2015-11-02 DIAGNOSIS — Z993 Dependence on wheelchair: Secondary | ICD-10-CM | POA: Diagnosis not present

## 2015-11-05 DIAGNOSIS — Z993 Dependence on wheelchair: Secondary | ICD-10-CM | POA: Diagnosis not present

## 2015-11-05 DIAGNOSIS — I69952 Hemiplegia and hemiparesis following unspecified cerebrovascular disease affecting left dominant side: Secondary | ICD-10-CM | POA: Diagnosis not present

## 2015-11-05 DIAGNOSIS — W050XXD Fall from non-moving wheelchair, subsequent encounter: Secondary | ICD-10-CM | POA: Diagnosis not present

## 2015-11-05 DIAGNOSIS — M6281 Muscle weakness (generalized): Secondary | ICD-10-CM | POA: Diagnosis not present

## 2015-11-06 DIAGNOSIS — W050XXD Fall from non-moving wheelchair, subsequent encounter: Secondary | ICD-10-CM | POA: Diagnosis not present

## 2015-11-06 DIAGNOSIS — I69952 Hemiplegia and hemiparesis following unspecified cerebrovascular disease affecting left dominant side: Secondary | ICD-10-CM | POA: Diagnosis not present

## 2015-11-06 DIAGNOSIS — M6281 Muscle weakness (generalized): Secondary | ICD-10-CM | POA: Diagnosis not present

## 2015-11-06 DIAGNOSIS — Z993 Dependence on wheelchair: Secondary | ICD-10-CM | POA: Diagnosis not present

## 2015-11-07 DIAGNOSIS — I69952 Hemiplegia and hemiparesis following unspecified cerebrovascular disease affecting left dominant side: Secondary | ICD-10-CM | POA: Diagnosis not present

## 2015-11-07 DIAGNOSIS — Z993 Dependence on wheelchair: Secondary | ICD-10-CM | POA: Diagnosis not present

## 2015-11-07 DIAGNOSIS — M6281 Muscle weakness (generalized): Secondary | ICD-10-CM | POA: Diagnosis not present

## 2015-11-07 DIAGNOSIS — W050XXD Fall from non-moving wheelchair, subsequent encounter: Secondary | ICD-10-CM | POA: Diagnosis not present

## 2015-11-08 DIAGNOSIS — Z993 Dependence on wheelchair: Secondary | ICD-10-CM | POA: Diagnosis not present

## 2015-11-08 DIAGNOSIS — I69851 Hemiplegia and hemiparesis following other cerebrovascular disease affecting right dominant side: Secondary | ICD-10-CM | POA: Diagnosis not present

## 2015-11-08 DIAGNOSIS — M6281 Muscle weakness (generalized): Secondary | ICD-10-CM | POA: Diagnosis not present

## 2015-11-08 DIAGNOSIS — I69952 Hemiplegia and hemiparesis following unspecified cerebrovascular disease affecting left dominant side: Secondary | ICD-10-CM | POA: Diagnosis not present

## 2015-11-08 DIAGNOSIS — R293 Abnormal posture: Secondary | ICD-10-CM | POA: Diagnosis not present

## 2015-11-08 DIAGNOSIS — W050XXD Fall from non-moving wheelchair, subsequent encounter: Secondary | ICD-10-CM | POA: Diagnosis not present

## 2015-11-08 DIAGNOSIS — I69951 Hemiplegia and hemiparesis following unspecified cerebrovascular disease affecting right dominant side: Secondary | ICD-10-CM | POA: Diagnosis not present

## 2015-11-09 DIAGNOSIS — Z993 Dependence on wheelchair: Secondary | ICD-10-CM | POA: Diagnosis not present

## 2015-11-09 DIAGNOSIS — W050XXD Fall from non-moving wheelchair, subsequent encounter: Secondary | ICD-10-CM | POA: Diagnosis not present

## 2015-11-09 DIAGNOSIS — M6281 Muscle weakness (generalized): Secondary | ICD-10-CM | POA: Diagnosis not present

## 2015-11-09 DIAGNOSIS — I69952 Hemiplegia and hemiparesis following unspecified cerebrovascular disease affecting left dominant side: Secondary | ICD-10-CM | POA: Diagnosis not present

## 2015-11-09 DIAGNOSIS — I69851 Hemiplegia and hemiparesis following other cerebrovascular disease affecting right dominant side: Secondary | ICD-10-CM | POA: Diagnosis not present

## 2015-11-09 DIAGNOSIS — I69951 Hemiplegia and hemiparesis following unspecified cerebrovascular disease affecting right dominant side: Secondary | ICD-10-CM | POA: Diagnosis not present

## 2015-11-12 DIAGNOSIS — M6281 Muscle weakness (generalized): Secondary | ICD-10-CM | POA: Diagnosis not present

## 2015-11-12 DIAGNOSIS — I69851 Hemiplegia and hemiparesis following other cerebrovascular disease affecting right dominant side: Secondary | ICD-10-CM | POA: Diagnosis not present

## 2015-11-12 DIAGNOSIS — W050XXD Fall from non-moving wheelchair, subsequent encounter: Secondary | ICD-10-CM | POA: Diagnosis not present

## 2015-11-12 DIAGNOSIS — Z993 Dependence on wheelchair: Secondary | ICD-10-CM | POA: Diagnosis not present

## 2015-11-12 DIAGNOSIS — I69952 Hemiplegia and hemiparesis following unspecified cerebrovascular disease affecting left dominant side: Secondary | ICD-10-CM | POA: Diagnosis not present

## 2015-11-12 DIAGNOSIS — I69951 Hemiplegia and hemiparesis following unspecified cerebrovascular disease affecting right dominant side: Secondary | ICD-10-CM | POA: Diagnosis not present

## 2015-11-13 DIAGNOSIS — W050XXD Fall from non-moving wheelchair, subsequent encounter: Secondary | ICD-10-CM | POA: Diagnosis not present

## 2015-11-13 DIAGNOSIS — M6281 Muscle weakness (generalized): Secondary | ICD-10-CM | POA: Diagnosis not present

## 2015-11-13 DIAGNOSIS — I69951 Hemiplegia and hemiparesis following unspecified cerebrovascular disease affecting right dominant side: Secondary | ICD-10-CM | POA: Diagnosis not present

## 2015-11-13 DIAGNOSIS — I69952 Hemiplegia and hemiparesis following unspecified cerebrovascular disease affecting left dominant side: Secondary | ICD-10-CM | POA: Diagnosis not present

## 2015-11-13 DIAGNOSIS — I69851 Hemiplegia and hemiparesis following other cerebrovascular disease affecting right dominant side: Secondary | ICD-10-CM | POA: Diagnosis not present

## 2015-11-13 DIAGNOSIS — Z993 Dependence on wheelchair: Secondary | ICD-10-CM | POA: Diagnosis not present

## 2015-11-14 DIAGNOSIS — M6281 Muscle weakness (generalized): Secondary | ICD-10-CM | POA: Diagnosis not present

## 2015-11-14 DIAGNOSIS — Z993 Dependence on wheelchair: Secondary | ICD-10-CM | POA: Diagnosis not present

## 2015-11-14 DIAGNOSIS — I69851 Hemiplegia and hemiparesis following other cerebrovascular disease affecting right dominant side: Secondary | ICD-10-CM | POA: Diagnosis not present

## 2015-11-14 DIAGNOSIS — W050XXD Fall from non-moving wheelchair, subsequent encounter: Secondary | ICD-10-CM | POA: Diagnosis not present

## 2015-11-14 DIAGNOSIS — I69951 Hemiplegia and hemiparesis following unspecified cerebrovascular disease affecting right dominant side: Secondary | ICD-10-CM | POA: Diagnosis not present

## 2015-11-14 DIAGNOSIS — I69952 Hemiplegia and hemiparesis following unspecified cerebrovascular disease affecting left dominant side: Secondary | ICD-10-CM | POA: Diagnosis not present

## 2015-11-15 DIAGNOSIS — W050XXD Fall from non-moving wheelchair, subsequent encounter: Secondary | ICD-10-CM | POA: Diagnosis not present

## 2015-11-15 DIAGNOSIS — Z993 Dependence on wheelchair: Secondary | ICD-10-CM | POA: Diagnosis not present

## 2015-11-15 DIAGNOSIS — M6281 Muscle weakness (generalized): Secondary | ICD-10-CM | POA: Diagnosis not present

## 2015-11-15 DIAGNOSIS — I69951 Hemiplegia and hemiparesis following unspecified cerebrovascular disease affecting right dominant side: Secondary | ICD-10-CM | POA: Diagnosis not present

## 2015-11-15 DIAGNOSIS — I69851 Hemiplegia and hemiparesis following other cerebrovascular disease affecting right dominant side: Secondary | ICD-10-CM | POA: Diagnosis not present

## 2015-11-15 DIAGNOSIS — I69952 Hemiplegia and hemiparesis following unspecified cerebrovascular disease affecting left dominant side: Secondary | ICD-10-CM | POA: Diagnosis not present

## 2015-11-16 DIAGNOSIS — W050XXD Fall from non-moving wheelchair, subsequent encounter: Secondary | ICD-10-CM | POA: Diagnosis not present

## 2015-11-16 DIAGNOSIS — Z993 Dependence on wheelchair: Secondary | ICD-10-CM | POA: Diagnosis not present

## 2015-11-16 DIAGNOSIS — I69851 Hemiplegia and hemiparesis following other cerebrovascular disease affecting right dominant side: Secondary | ICD-10-CM | POA: Diagnosis not present

## 2015-11-16 DIAGNOSIS — I69952 Hemiplegia and hemiparesis following unspecified cerebrovascular disease affecting left dominant side: Secondary | ICD-10-CM | POA: Diagnosis not present

## 2015-11-16 DIAGNOSIS — M6281 Muscle weakness (generalized): Secondary | ICD-10-CM | POA: Diagnosis not present

## 2015-11-16 DIAGNOSIS — I69951 Hemiplegia and hemiparesis following unspecified cerebrovascular disease affecting right dominant side: Secondary | ICD-10-CM | POA: Diagnosis not present

## 2015-11-19 DIAGNOSIS — I69951 Hemiplegia and hemiparesis following unspecified cerebrovascular disease affecting right dominant side: Secondary | ICD-10-CM | POA: Diagnosis not present

## 2015-11-19 DIAGNOSIS — M6281 Muscle weakness (generalized): Secondary | ICD-10-CM | POA: Diagnosis not present

## 2015-11-19 DIAGNOSIS — I69952 Hemiplegia and hemiparesis following unspecified cerebrovascular disease affecting left dominant side: Secondary | ICD-10-CM | POA: Diagnosis not present

## 2015-11-19 DIAGNOSIS — Z993 Dependence on wheelchair: Secondary | ICD-10-CM | POA: Diagnosis not present

## 2015-11-19 DIAGNOSIS — W050XXD Fall from non-moving wheelchair, subsequent encounter: Secondary | ICD-10-CM | POA: Diagnosis not present

## 2015-11-19 DIAGNOSIS — I69851 Hemiplegia and hemiparesis following other cerebrovascular disease affecting right dominant side: Secondary | ICD-10-CM | POA: Diagnosis not present

## 2015-11-21 DIAGNOSIS — Z993 Dependence on wheelchair: Secondary | ICD-10-CM | POA: Diagnosis not present

## 2015-11-21 DIAGNOSIS — W050XXD Fall from non-moving wheelchair, subsequent encounter: Secondary | ICD-10-CM | POA: Diagnosis not present

## 2015-11-21 DIAGNOSIS — M6281 Muscle weakness (generalized): Secondary | ICD-10-CM | POA: Diagnosis not present

## 2015-11-21 DIAGNOSIS — I69851 Hemiplegia and hemiparesis following other cerebrovascular disease affecting right dominant side: Secondary | ICD-10-CM | POA: Diagnosis not present

## 2015-11-21 DIAGNOSIS — I69952 Hemiplegia and hemiparesis following unspecified cerebrovascular disease affecting left dominant side: Secondary | ICD-10-CM | POA: Diagnosis not present

## 2015-11-21 DIAGNOSIS — I69951 Hemiplegia and hemiparesis following unspecified cerebrovascular disease affecting right dominant side: Secondary | ICD-10-CM | POA: Diagnosis not present

## 2015-11-22 DIAGNOSIS — Z993 Dependence on wheelchair: Secondary | ICD-10-CM | POA: Diagnosis not present

## 2015-11-22 DIAGNOSIS — F0391 Unspecified dementia with behavioral disturbance: Secondary | ICD-10-CM | POA: Diagnosis not present

## 2015-11-22 DIAGNOSIS — I69851 Hemiplegia and hemiparesis following other cerebrovascular disease affecting right dominant side: Secondary | ICD-10-CM | POA: Diagnosis not present

## 2015-11-22 DIAGNOSIS — I69952 Hemiplegia and hemiparesis following unspecified cerebrovascular disease affecting left dominant side: Secondary | ICD-10-CM | POA: Diagnosis not present

## 2015-11-22 DIAGNOSIS — G40909 Epilepsy, unspecified, not intractable, without status epilepticus: Secondary | ICD-10-CM | POA: Diagnosis not present

## 2015-11-22 DIAGNOSIS — W050XXD Fall from non-moving wheelchair, subsequent encounter: Secondary | ICD-10-CM | POA: Diagnosis not present

## 2015-11-22 DIAGNOSIS — I69951 Hemiplegia and hemiparesis following unspecified cerebrovascular disease affecting right dominant side: Secondary | ICD-10-CM | POA: Diagnosis not present

## 2015-11-22 DIAGNOSIS — M6281 Muscle weakness (generalized): Secondary | ICD-10-CM | POA: Diagnosis not present

## 2015-11-22 DIAGNOSIS — I82401 Acute embolism and thrombosis of unspecified deep veins of right lower extremity: Secondary | ICD-10-CM | POA: Diagnosis not present

## 2015-11-22 DIAGNOSIS — F322 Major depressive disorder, single episode, severe without psychotic features: Secondary | ICD-10-CM | POA: Diagnosis not present

## 2015-11-23 DIAGNOSIS — I69952 Hemiplegia and hemiparesis following unspecified cerebrovascular disease affecting left dominant side: Secondary | ICD-10-CM | POA: Diagnosis not present

## 2015-11-23 DIAGNOSIS — I69851 Hemiplegia and hemiparesis following other cerebrovascular disease affecting right dominant side: Secondary | ICD-10-CM | POA: Diagnosis not present

## 2015-11-23 DIAGNOSIS — Z993 Dependence on wheelchair: Secondary | ICD-10-CM | POA: Diagnosis not present

## 2015-11-23 DIAGNOSIS — W050XXD Fall from non-moving wheelchair, subsequent encounter: Secondary | ICD-10-CM | POA: Diagnosis not present

## 2015-11-23 DIAGNOSIS — I69951 Hemiplegia and hemiparesis following unspecified cerebrovascular disease affecting right dominant side: Secondary | ICD-10-CM | POA: Diagnosis not present

## 2015-11-23 DIAGNOSIS — M6281 Muscle weakness (generalized): Secondary | ICD-10-CM | POA: Diagnosis not present

## 2015-11-24 DIAGNOSIS — I69951 Hemiplegia and hemiparesis following unspecified cerebrovascular disease affecting right dominant side: Secondary | ICD-10-CM | POA: Diagnosis not present

## 2015-11-24 DIAGNOSIS — Z993 Dependence on wheelchair: Secondary | ICD-10-CM | POA: Diagnosis not present

## 2015-11-24 DIAGNOSIS — M6281 Muscle weakness (generalized): Secondary | ICD-10-CM | POA: Diagnosis not present

## 2015-11-24 DIAGNOSIS — I69952 Hemiplegia and hemiparesis following unspecified cerebrovascular disease affecting left dominant side: Secondary | ICD-10-CM | POA: Diagnosis not present

## 2015-11-24 DIAGNOSIS — W050XXD Fall from non-moving wheelchair, subsequent encounter: Secondary | ICD-10-CM | POA: Diagnosis not present

## 2015-11-24 DIAGNOSIS — I69851 Hemiplegia and hemiparesis following other cerebrovascular disease affecting right dominant side: Secondary | ICD-10-CM | POA: Diagnosis not present

## 2015-11-26 DIAGNOSIS — I69951 Hemiplegia and hemiparesis following unspecified cerebrovascular disease affecting right dominant side: Secondary | ICD-10-CM | POA: Diagnosis not present

## 2015-11-26 DIAGNOSIS — I69952 Hemiplegia and hemiparesis following unspecified cerebrovascular disease affecting left dominant side: Secondary | ICD-10-CM | POA: Diagnosis not present

## 2015-11-26 DIAGNOSIS — W050XXD Fall from non-moving wheelchair, subsequent encounter: Secondary | ICD-10-CM | POA: Diagnosis not present

## 2015-11-26 DIAGNOSIS — Z993 Dependence on wheelchair: Secondary | ICD-10-CM | POA: Diagnosis not present

## 2015-11-26 DIAGNOSIS — M6281 Muscle weakness (generalized): Secondary | ICD-10-CM | POA: Diagnosis not present

## 2015-11-26 DIAGNOSIS — I69851 Hemiplegia and hemiparesis following other cerebrovascular disease affecting right dominant side: Secondary | ICD-10-CM | POA: Diagnosis not present

## 2015-11-27 DIAGNOSIS — M6281 Muscle weakness (generalized): Secondary | ICD-10-CM | POA: Diagnosis not present

## 2015-11-27 DIAGNOSIS — I69951 Hemiplegia and hemiparesis following unspecified cerebrovascular disease affecting right dominant side: Secondary | ICD-10-CM | POA: Diagnosis not present

## 2015-11-27 DIAGNOSIS — I69952 Hemiplegia and hemiparesis following unspecified cerebrovascular disease affecting left dominant side: Secondary | ICD-10-CM | POA: Diagnosis not present

## 2015-11-27 DIAGNOSIS — Z993 Dependence on wheelchair: Secondary | ICD-10-CM | POA: Diagnosis not present

## 2015-11-27 DIAGNOSIS — I69851 Hemiplegia and hemiparesis following other cerebrovascular disease affecting right dominant side: Secondary | ICD-10-CM | POA: Diagnosis not present

## 2015-11-27 DIAGNOSIS — W050XXD Fall from non-moving wheelchair, subsequent encounter: Secondary | ICD-10-CM | POA: Diagnosis not present

## 2015-11-28 DIAGNOSIS — M6281 Muscle weakness (generalized): Secondary | ICD-10-CM | POA: Diagnosis not present

## 2015-11-28 DIAGNOSIS — Z993 Dependence on wheelchair: Secondary | ICD-10-CM | POA: Diagnosis not present

## 2015-11-28 DIAGNOSIS — I69952 Hemiplegia and hemiparesis following unspecified cerebrovascular disease affecting left dominant side: Secondary | ICD-10-CM | POA: Diagnosis not present

## 2015-11-28 DIAGNOSIS — W050XXD Fall from non-moving wheelchair, subsequent encounter: Secondary | ICD-10-CM | POA: Diagnosis not present

## 2015-11-28 DIAGNOSIS — I69851 Hemiplegia and hemiparesis following other cerebrovascular disease affecting right dominant side: Secondary | ICD-10-CM | POA: Diagnosis not present

## 2015-11-28 DIAGNOSIS — I69951 Hemiplegia and hemiparesis following unspecified cerebrovascular disease affecting right dominant side: Secondary | ICD-10-CM | POA: Diagnosis not present

## 2015-11-29 DIAGNOSIS — I69851 Hemiplegia and hemiparesis following other cerebrovascular disease affecting right dominant side: Secondary | ICD-10-CM | POA: Diagnosis not present

## 2015-11-29 DIAGNOSIS — I69951 Hemiplegia and hemiparesis following unspecified cerebrovascular disease affecting right dominant side: Secondary | ICD-10-CM | POA: Diagnosis not present

## 2015-11-29 DIAGNOSIS — M6281 Muscle weakness (generalized): Secondary | ICD-10-CM | POA: Diagnosis not present

## 2015-11-29 DIAGNOSIS — W050XXD Fall from non-moving wheelchair, subsequent encounter: Secondary | ICD-10-CM | POA: Diagnosis not present

## 2015-11-29 DIAGNOSIS — I69952 Hemiplegia and hemiparesis following unspecified cerebrovascular disease affecting left dominant side: Secondary | ICD-10-CM | POA: Diagnosis not present

## 2015-11-29 DIAGNOSIS — Z993 Dependence on wheelchair: Secondary | ICD-10-CM | POA: Diagnosis not present

## 2015-11-30 DIAGNOSIS — Z993 Dependence on wheelchair: Secondary | ICD-10-CM | POA: Diagnosis not present

## 2015-11-30 DIAGNOSIS — I69952 Hemiplegia and hemiparesis following unspecified cerebrovascular disease affecting left dominant side: Secondary | ICD-10-CM | POA: Diagnosis not present

## 2015-11-30 DIAGNOSIS — I69851 Hemiplegia and hemiparesis following other cerebrovascular disease affecting right dominant side: Secondary | ICD-10-CM | POA: Diagnosis not present

## 2015-11-30 DIAGNOSIS — M6281 Muscle weakness (generalized): Secondary | ICD-10-CM | POA: Diagnosis not present

## 2015-11-30 DIAGNOSIS — I69951 Hemiplegia and hemiparesis following unspecified cerebrovascular disease affecting right dominant side: Secondary | ICD-10-CM | POA: Diagnosis not present

## 2015-11-30 DIAGNOSIS — W050XXD Fall from non-moving wheelchair, subsequent encounter: Secondary | ICD-10-CM | POA: Diagnosis not present

## 2015-12-03 DIAGNOSIS — I69851 Hemiplegia and hemiparesis following other cerebrovascular disease affecting right dominant side: Secondary | ICD-10-CM | POA: Diagnosis not present

## 2015-12-03 DIAGNOSIS — I69952 Hemiplegia and hemiparesis following unspecified cerebrovascular disease affecting left dominant side: Secondary | ICD-10-CM | POA: Diagnosis not present

## 2015-12-03 DIAGNOSIS — M6281 Muscle weakness (generalized): Secondary | ICD-10-CM | POA: Diagnosis not present

## 2015-12-03 DIAGNOSIS — I69951 Hemiplegia and hemiparesis following unspecified cerebrovascular disease affecting right dominant side: Secondary | ICD-10-CM | POA: Diagnosis not present

## 2015-12-03 DIAGNOSIS — Z993 Dependence on wheelchair: Secondary | ICD-10-CM | POA: Diagnosis not present

## 2015-12-03 DIAGNOSIS — W050XXD Fall from non-moving wheelchair, subsequent encounter: Secondary | ICD-10-CM | POA: Diagnosis not present

## 2015-12-04 DIAGNOSIS — I69951 Hemiplegia and hemiparesis following unspecified cerebrovascular disease affecting right dominant side: Secondary | ICD-10-CM | POA: Diagnosis not present

## 2015-12-04 DIAGNOSIS — I69952 Hemiplegia and hemiparesis following unspecified cerebrovascular disease affecting left dominant side: Secondary | ICD-10-CM | POA: Diagnosis not present

## 2015-12-04 DIAGNOSIS — Z993 Dependence on wheelchair: Secondary | ICD-10-CM | POA: Diagnosis not present

## 2015-12-04 DIAGNOSIS — W050XXD Fall from non-moving wheelchair, subsequent encounter: Secondary | ICD-10-CM | POA: Diagnosis not present

## 2015-12-04 DIAGNOSIS — I69851 Hemiplegia and hemiparesis following other cerebrovascular disease affecting right dominant side: Secondary | ICD-10-CM | POA: Diagnosis not present

## 2015-12-04 DIAGNOSIS — M6281 Muscle weakness (generalized): Secondary | ICD-10-CM | POA: Diagnosis not present

## 2015-12-05 DIAGNOSIS — I69951 Hemiplegia and hemiparesis following unspecified cerebrovascular disease affecting right dominant side: Secondary | ICD-10-CM | POA: Diagnosis not present

## 2015-12-05 DIAGNOSIS — Z993 Dependence on wheelchair: Secondary | ICD-10-CM | POA: Diagnosis not present

## 2015-12-05 DIAGNOSIS — I69851 Hemiplegia and hemiparesis following other cerebrovascular disease affecting right dominant side: Secondary | ICD-10-CM | POA: Diagnosis not present

## 2015-12-05 DIAGNOSIS — M6281 Muscle weakness (generalized): Secondary | ICD-10-CM | POA: Diagnosis not present

## 2015-12-05 DIAGNOSIS — I69952 Hemiplegia and hemiparesis following unspecified cerebrovascular disease affecting left dominant side: Secondary | ICD-10-CM | POA: Diagnosis not present

## 2015-12-05 DIAGNOSIS — W050XXD Fall from non-moving wheelchair, subsequent encounter: Secondary | ICD-10-CM | POA: Diagnosis not present

## 2015-12-06 DIAGNOSIS — I69851 Hemiplegia and hemiparesis following other cerebrovascular disease affecting right dominant side: Secondary | ICD-10-CM | POA: Diagnosis not present

## 2015-12-06 DIAGNOSIS — I69952 Hemiplegia and hemiparesis following unspecified cerebrovascular disease affecting left dominant side: Secondary | ICD-10-CM | POA: Diagnosis not present

## 2015-12-06 DIAGNOSIS — I69951 Hemiplegia and hemiparesis following unspecified cerebrovascular disease affecting right dominant side: Secondary | ICD-10-CM | POA: Diagnosis not present

## 2015-12-06 DIAGNOSIS — W050XXD Fall from non-moving wheelchair, subsequent encounter: Secondary | ICD-10-CM | POA: Diagnosis not present

## 2015-12-06 DIAGNOSIS — M6281 Muscle weakness (generalized): Secondary | ICD-10-CM | POA: Diagnosis not present

## 2015-12-06 DIAGNOSIS — Z993 Dependence on wheelchair: Secondary | ICD-10-CM | POA: Diagnosis not present

## 2015-12-07 DIAGNOSIS — I69851 Hemiplegia and hemiparesis following other cerebrovascular disease affecting right dominant side: Secondary | ICD-10-CM | POA: Diagnosis not present

## 2015-12-07 DIAGNOSIS — I69951 Hemiplegia and hemiparesis following unspecified cerebrovascular disease affecting right dominant side: Secondary | ICD-10-CM | POA: Diagnosis not present

## 2015-12-07 DIAGNOSIS — W050XXD Fall from non-moving wheelchair, subsequent encounter: Secondary | ICD-10-CM | POA: Diagnosis not present

## 2015-12-07 DIAGNOSIS — I69952 Hemiplegia and hemiparesis following unspecified cerebrovascular disease affecting left dominant side: Secondary | ICD-10-CM | POA: Diagnosis not present

## 2015-12-07 DIAGNOSIS — Z993 Dependence on wheelchair: Secondary | ICD-10-CM | POA: Diagnosis not present

## 2015-12-07 DIAGNOSIS — M6281 Muscle weakness (generalized): Secondary | ICD-10-CM | POA: Diagnosis not present

## 2015-12-08 DIAGNOSIS — R293 Abnormal posture: Secondary | ICD-10-CM | POA: Diagnosis not present

## 2015-12-08 DIAGNOSIS — I69951 Hemiplegia and hemiparesis following unspecified cerebrovascular disease affecting right dominant side: Secondary | ICD-10-CM | POA: Diagnosis not present

## 2015-12-08 DIAGNOSIS — I69952 Hemiplegia and hemiparesis following unspecified cerebrovascular disease affecting left dominant side: Secondary | ICD-10-CM | POA: Diagnosis not present

## 2015-12-08 DIAGNOSIS — I69851 Hemiplegia and hemiparesis following other cerebrovascular disease affecting right dominant side: Secondary | ICD-10-CM | POA: Diagnosis not present

## 2015-12-08 DIAGNOSIS — W050XXD Fall from non-moving wheelchair, subsequent encounter: Secondary | ICD-10-CM | POA: Diagnosis not present

## 2015-12-08 DIAGNOSIS — Z993 Dependence on wheelchair: Secondary | ICD-10-CM | POA: Diagnosis not present

## 2015-12-08 DIAGNOSIS — M6281 Muscle weakness (generalized): Secondary | ICD-10-CM | POA: Diagnosis not present

## 2015-12-10 DIAGNOSIS — M6281 Muscle weakness (generalized): Secondary | ICD-10-CM | POA: Diagnosis not present

## 2015-12-10 DIAGNOSIS — W050XXD Fall from non-moving wheelchair, subsequent encounter: Secondary | ICD-10-CM | POA: Diagnosis not present

## 2015-12-10 DIAGNOSIS — Z993 Dependence on wheelchair: Secondary | ICD-10-CM | POA: Diagnosis not present

## 2015-12-10 DIAGNOSIS — I69851 Hemiplegia and hemiparesis following other cerebrovascular disease affecting right dominant side: Secondary | ICD-10-CM | POA: Diagnosis not present

## 2015-12-10 DIAGNOSIS — I69952 Hemiplegia and hemiparesis following unspecified cerebrovascular disease affecting left dominant side: Secondary | ICD-10-CM | POA: Diagnosis not present

## 2015-12-10 DIAGNOSIS — I69951 Hemiplegia and hemiparesis following unspecified cerebrovascular disease affecting right dominant side: Secondary | ICD-10-CM | POA: Diagnosis not present

## 2015-12-11 DIAGNOSIS — I69952 Hemiplegia and hemiparesis following unspecified cerebrovascular disease affecting left dominant side: Secondary | ICD-10-CM | POA: Diagnosis not present

## 2015-12-11 DIAGNOSIS — W050XXD Fall from non-moving wheelchair, subsequent encounter: Secondary | ICD-10-CM | POA: Diagnosis not present

## 2015-12-11 DIAGNOSIS — I69851 Hemiplegia and hemiparesis following other cerebrovascular disease affecting right dominant side: Secondary | ICD-10-CM | POA: Diagnosis not present

## 2015-12-11 DIAGNOSIS — Z993 Dependence on wheelchair: Secondary | ICD-10-CM | POA: Diagnosis not present

## 2015-12-11 DIAGNOSIS — M6281 Muscle weakness (generalized): Secondary | ICD-10-CM | POA: Diagnosis not present

## 2015-12-11 DIAGNOSIS — I69951 Hemiplegia and hemiparesis following unspecified cerebrovascular disease affecting right dominant side: Secondary | ICD-10-CM | POA: Diagnosis not present

## 2015-12-12 DIAGNOSIS — W050XXD Fall from non-moving wheelchair, subsequent encounter: Secondary | ICD-10-CM | POA: Diagnosis not present

## 2015-12-12 DIAGNOSIS — I69851 Hemiplegia and hemiparesis following other cerebrovascular disease affecting right dominant side: Secondary | ICD-10-CM | POA: Diagnosis not present

## 2015-12-12 DIAGNOSIS — Z993 Dependence on wheelchair: Secondary | ICD-10-CM | POA: Diagnosis not present

## 2015-12-12 DIAGNOSIS — I69952 Hemiplegia and hemiparesis following unspecified cerebrovascular disease affecting left dominant side: Secondary | ICD-10-CM | POA: Diagnosis not present

## 2015-12-12 DIAGNOSIS — I69951 Hemiplegia and hemiparesis following unspecified cerebrovascular disease affecting right dominant side: Secondary | ICD-10-CM | POA: Diagnosis not present

## 2015-12-12 DIAGNOSIS — M6281 Muscle weakness (generalized): Secondary | ICD-10-CM | POA: Diagnosis not present

## 2015-12-13 DIAGNOSIS — I69951 Hemiplegia and hemiparesis following unspecified cerebrovascular disease affecting right dominant side: Secondary | ICD-10-CM | POA: Diagnosis not present

## 2015-12-13 DIAGNOSIS — I69851 Hemiplegia and hemiparesis following other cerebrovascular disease affecting right dominant side: Secondary | ICD-10-CM | POA: Diagnosis not present

## 2015-12-13 DIAGNOSIS — M6281 Muscle weakness (generalized): Secondary | ICD-10-CM | POA: Diagnosis not present

## 2015-12-13 DIAGNOSIS — I69952 Hemiplegia and hemiparesis following unspecified cerebrovascular disease affecting left dominant side: Secondary | ICD-10-CM | POA: Diagnosis not present

## 2015-12-13 DIAGNOSIS — W050XXD Fall from non-moving wheelchair, subsequent encounter: Secondary | ICD-10-CM | POA: Diagnosis not present

## 2015-12-13 DIAGNOSIS — Z993 Dependence on wheelchair: Secondary | ICD-10-CM | POA: Diagnosis not present

## 2015-12-14 DIAGNOSIS — M6281 Muscle weakness (generalized): Secondary | ICD-10-CM | POA: Diagnosis not present

## 2015-12-14 DIAGNOSIS — I69952 Hemiplegia and hemiparesis following unspecified cerebrovascular disease affecting left dominant side: Secondary | ICD-10-CM | POA: Diagnosis not present

## 2015-12-14 DIAGNOSIS — Z993 Dependence on wheelchair: Secondary | ICD-10-CM | POA: Diagnosis not present

## 2015-12-14 DIAGNOSIS — I69951 Hemiplegia and hemiparesis following unspecified cerebrovascular disease affecting right dominant side: Secondary | ICD-10-CM | POA: Diagnosis not present

## 2015-12-14 DIAGNOSIS — W050XXD Fall from non-moving wheelchair, subsequent encounter: Secondary | ICD-10-CM | POA: Diagnosis not present

## 2015-12-14 DIAGNOSIS — I69851 Hemiplegia and hemiparesis following other cerebrovascular disease affecting right dominant side: Secondary | ICD-10-CM | POA: Diagnosis not present

## 2015-12-15 DIAGNOSIS — Z993 Dependence on wheelchair: Secondary | ICD-10-CM | POA: Diagnosis not present

## 2015-12-15 DIAGNOSIS — I69951 Hemiplegia and hemiparesis following unspecified cerebrovascular disease affecting right dominant side: Secondary | ICD-10-CM | POA: Diagnosis not present

## 2015-12-15 DIAGNOSIS — I69851 Hemiplegia and hemiparesis following other cerebrovascular disease affecting right dominant side: Secondary | ICD-10-CM | POA: Diagnosis not present

## 2015-12-15 DIAGNOSIS — W050XXD Fall from non-moving wheelchair, subsequent encounter: Secondary | ICD-10-CM | POA: Diagnosis not present

## 2015-12-15 DIAGNOSIS — M6281 Muscle weakness (generalized): Secondary | ICD-10-CM | POA: Diagnosis not present

## 2015-12-15 DIAGNOSIS — I69952 Hemiplegia and hemiparesis following unspecified cerebrovascular disease affecting left dominant side: Secondary | ICD-10-CM | POA: Diagnosis not present

## 2015-12-17 DIAGNOSIS — I69952 Hemiplegia and hemiparesis following unspecified cerebrovascular disease affecting left dominant side: Secondary | ICD-10-CM | POA: Diagnosis not present

## 2015-12-17 DIAGNOSIS — Z993 Dependence on wheelchair: Secondary | ICD-10-CM | POA: Diagnosis not present

## 2015-12-17 DIAGNOSIS — M6281 Muscle weakness (generalized): Secondary | ICD-10-CM | POA: Diagnosis not present

## 2015-12-17 DIAGNOSIS — W050XXD Fall from non-moving wheelchair, subsequent encounter: Secondary | ICD-10-CM | POA: Diagnosis not present

## 2015-12-17 DIAGNOSIS — I69851 Hemiplegia and hemiparesis following other cerebrovascular disease affecting right dominant side: Secondary | ICD-10-CM | POA: Diagnosis not present

## 2015-12-17 DIAGNOSIS — I69951 Hemiplegia and hemiparesis following unspecified cerebrovascular disease affecting right dominant side: Secondary | ICD-10-CM | POA: Diagnosis not present

## 2015-12-18 DIAGNOSIS — M6281 Muscle weakness (generalized): Secondary | ICD-10-CM | POA: Diagnosis not present

## 2015-12-18 DIAGNOSIS — I69951 Hemiplegia and hemiparesis following unspecified cerebrovascular disease affecting right dominant side: Secondary | ICD-10-CM | POA: Diagnosis not present

## 2015-12-18 DIAGNOSIS — I69952 Hemiplegia and hemiparesis following unspecified cerebrovascular disease affecting left dominant side: Secondary | ICD-10-CM | POA: Diagnosis not present

## 2015-12-18 DIAGNOSIS — I69851 Hemiplegia and hemiparesis following other cerebrovascular disease affecting right dominant side: Secondary | ICD-10-CM | POA: Diagnosis not present

## 2015-12-18 DIAGNOSIS — Z993 Dependence on wheelchair: Secondary | ICD-10-CM | POA: Diagnosis not present

## 2015-12-18 DIAGNOSIS — W050XXD Fall from non-moving wheelchair, subsequent encounter: Secondary | ICD-10-CM | POA: Diagnosis not present

## 2015-12-19 DIAGNOSIS — I69952 Hemiplegia and hemiparesis following unspecified cerebrovascular disease affecting left dominant side: Secondary | ICD-10-CM | POA: Diagnosis not present

## 2015-12-19 DIAGNOSIS — Z993 Dependence on wheelchair: Secondary | ICD-10-CM | POA: Diagnosis not present

## 2015-12-19 DIAGNOSIS — I69851 Hemiplegia and hemiparesis following other cerebrovascular disease affecting right dominant side: Secondary | ICD-10-CM | POA: Diagnosis not present

## 2015-12-19 DIAGNOSIS — M6281 Muscle weakness (generalized): Secondary | ICD-10-CM | POA: Diagnosis not present

## 2015-12-19 DIAGNOSIS — W050XXD Fall from non-moving wheelchair, subsequent encounter: Secondary | ICD-10-CM | POA: Diagnosis not present

## 2015-12-19 DIAGNOSIS — I69951 Hemiplegia and hemiparesis following unspecified cerebrovascular disease affecting right dominant side: Secondary | ICD-10-CM | POA: Diagnosis not present

## 2015-12-20 DIAGNOSIS — F322 Major depressive disorder, single episode, severe without psychotic features: Secondary | ICD-10-CM | POA: Diagnosis not present

## 2015-12-20 DIAGNOSIS — I69851 Hemiplegia and hemiparesis following other cerebrovascular disease affecting right dominant side: Secondary | ICD-10-CM | POA: Diagnosis not present

## 2015-12-20 DIAGNOSIS — R569 Unspecified convulsions: Secondary | ICD-10-CM | POA: Diagnosis not present

## 2015-12-20 DIAGNOSIS — F0391 Unspecified dementia with behavioral disturbance: Secondary | ICD-10-CM | POA: Diagnosis not present

## 2015-12-20 DIAGNOSIS — G8191 Hemiplegia, unspecified affecting right dominant side: Secondary | ICD-10-CM | POA: Diagnosis not present

## 2015-12-20 DIAGNOSIS — W050XXD Fall from non-moving wheelchair, subsequent encounter: Secondary | ICD-10-CM | POA: Diagnosis not present

## 2015-12-20 DIAGNOSIS — I82401 Acute embolism and thrombosis of unspecified deep veins of right lower extremity: Secondary | ICD-10-CM | POA: Diagnosis not present

## 2015-12-20 DIAGNOSIS — I10 Essential (primary) hypertension: Secondary | ICD-10-CM | POA: Diagnosis not present

## 2015-12-20 DIAGNOSIS — I69951 Hemiplegia and hemiparesis following unspecified cerebrovascular disease affecting right dominant side: Secondary | ICD-10-CM | POA: Diagnosis not present

## 2015-12-20 DIAGNOSIS — M6281 Muscle weakness (generalized): Secondary | ICD-10-CM | POA: Diagnosis not present

## 2015-12-20 DIAGNOSIS — Z993 Dependence on wheelchair: Secondary | ICD-10-CM | POA: Diagnosis not present

## 2015-12-20 DIAGNOSIS — I69359 Hemiplegia and hemiparesis following cerebral infarction affecting unspecified side: Secondary | ICD-10-CM | POA: Diagnosis not present

## 2015-12-20 DIAGNOSIS — I69952 Hemiplegia and hemiparesis following unspecified cerebrovascular disease affecting left dominant side: Secondary | ICD-10-CM | POA: Diagnosis not present

## 2015-12-21 DIAGNOSIS — I69851 Hemiplegia and hemiparesis following other cerebrovascular disease affecting right dominant side: Secondary | ICD-10-CM | POA: Diagnosis not present

## 2015-12-21 DIAGNOSIS — I69952 Hemiplegia and hemiparesis following unspecified cerebrovascular disease affecting left dominant side: Secondary | ICD-10-CM | POA: Diagnosis not present

## 2015-12-21 DIAGNOSIS — Z993 Dependence on wheelchair: Secondary | ICD-10-CM | POA: Diagnosis not present

## 2015-12-21 DIAGNOSIS — M6281 Muscle weakness (generalized): Secondary | ICD-10-CM | POA: Diagnosis not present

## 2015-12-21 DIAGNOSIS — W050XXD Fall from non-moving wheelchair, subsequent encounter: Secondary | ICD-10-CM | POA: Diagnosis not present

## 2015-12-21 DIAGNOSIS — I69951 Hemiplegia and hemiparesis following unspecified cerebrovascular disease affecting right dominant side: Secondary | ICD-10-CM | POA: Diagnosis not present

## 2015-12-24 DIAGNOSIS — W050XXD Fall from non-moving wheelchair, subsequent encounter: Secondary | ICD-10-CM | POA: Diagnosis not present

## 2015-12-24 DIAGNOSIS — M6281 Muscle weakness (generalized): Secondary | ICD-10-CM | POA: Diagnosis not present

## 2015-12-24 DIAGNOSIS — I69951 Hemiplegia and hemiparesis following unspecified cerebrovascular disease affecting right dominant side: Secondary | ICD-10-CM | POA: Diagnosis not present

## 2015-12-24 DIAGNOSIS — I69851 Hemiplegia and hemiparesis following other cerebrovascular disease affecting right dominant side: Secondary | ICD-10-CM | POA: Diagnosis not present

## 2015-12-24 DIAGNOSIS — I69952 Hemiplegia and hemiparesis following unspecified cerebrovascular disease affecting left dominant side: Secondary | ICD-10-CM | POA: Diagnosis not present

## 2015-12-24 DIAGNOSIS — Z993 Dependence on wheelchair: Secondary | ICD-10-CM | POA: Diagnosis not present

## 2015-12-25 DIAGNOSIS — I69952 Hemiplegia and hemiparesis following unspecified cerebrovascular disease affecting left dominant side: Secondary | ICD-10-CM | POA: Diagnosis not present

## 2015-12-25 DIAGNOSIS — I69851 Hemiplegia and hemiparesis following other cerebrovascular disease affecting right dominant side: Secondary | ICD-10-CM | POA: Diagnosis not present

## 2015-12-25 DIAGNOSIS — M6281 Muscle weakness (generalized): Secondary | ICD-10-CM | POA: Diagnosis not present

## 2015-12-25 DIAGNOSIS — I69951 Hemiplegia and hemiparesis following unspecified cerebrovascular disease affecting right dominant side: Secondary | ICD-10-CM | POA: Diagnosis not present

## 2015-12-25 DIAGNOSIS — W050XXD Fall from non-moving wheelchair, subsequent encounter: Secondary | ICD-10-CM | POA: Diagnosis not present

## 2015-12-25 DIAGNOSIS — Z993 Dependence on wheelchair: Secondary | ICD-10-CM | POA: Diagnosis not present

## 2015-12-26 DIAGNOSIS — M6281 Muscle weakness (generalized): Secondary | ICD-10-CM | POA: Diagnosis not present

## 2015-12-26 DIAGNOSIS — I69851 Hemiplegia and hemiparesis following other cerebrovascular disease affecting right dominant side: Secondary | ICD-10-CM | POA: Diagnosis not present

## 2015-12-26 DIAGNOSIS — Z993 Dependence on wheelchair: Secondary | ICD-10-CM | POA: Diagnosis not present

## 2015-12-26 DIAGNOSIS — I69952 Hemiplegia and hemiparesis following unspecified cerebrovascular disease affecting left dominant side: Secondary | ICD-10-CM | POA: Diagnosis not present

## 2015-12-26 DIAGNOSIS — W050XXD Fall from non-moving wheelchair, subsequent encounter: Secondary | ICD-10-CM | POA: Diagnosis not present

## 2015-12-26 DIAGNOSIS — I69951 Hemiplegia and hemiparesis following unspecified cerebrovascular disease affecting right dominant side: Secondary | ICD-10-CM | POA: Diagnosis not present

## 2015-12-27 DIAGNOSIS — I69951 Hemiplegia and hemiparesis following unspecified cerebrovascular disease affecting right dominant side: Secondary | ICD-10-CM | POA: Diagnosis not present

## 2015-12-27 DIAGNOSIS — I69851 Hemiplegia and hemiparesis following other cerebrovascular disease affecting right dominant side: Secondary | ICD-10-CM | POA: Diagnosis not present

## 2015-12-27 DIAGNOSIS — I69952 Hemiplegia and hemiparesis following unspecified cerebrovascular disease affecting left dominant side: Secondary | ICD-10-CM | POA: Diagnosis not present

## 2015-12-27 DIAGNOSIS — Z993 Dependence on wheelchair: Secondary | ICD-10-CM | POA: Diagnosis not present

## 2015-12-27 DIAGNOSIS — M6281 Muscle weakness (generalized): Secondary | ICD-10-CM | POA: Diagnosis not present

## 2015-12-27 DIAGNOSIS — W050XXD Fall from non-moving wheelchair, subsequent encounter: Secondary | ICD-10-CM | POA: Diagnosis not present

## 2015-12-28 DIAGNOSIS — W050XXD Fall from non-moving wheelchair, subsequent encounter: Secondary | ICD-10-CM | POA: Diagnosis not present

## 2015-12-28 DIAGNOSIS — I69951 Hemiplegia and hemiparesis following unspecified cerebrovascular disease affecting right dominant side: Secondary | ICD-10-CM | POA: Diagnosis not present

## 2015-12-28 DIAGNOSIS — I69952 Hemiplegia and hemiparesis following unspecified cerebrovascular disease affecting left dominant side: Secondary | ICD-10-CM | POA: Diagnosis not present

## 2015-12-28 DIAGNOSIS — M6281 Muscle weakness (generalized): Secondary | ICD-10-CM | POA: Diagnosis not present

## 2015-12-28 DIAGNOSIS — Z993 Dependence on wheelchair: Secondary | ICD-10-CM | POA: Diagnosis not present

## 2015-12-28 DIAGNOSIS — I69851 Hemiplegia and hemiparesis following other cerebrovascular disease affecting right dominant side: Secondary | ICD-10-CM | POA: Diagnosis not present

## 2015-12-31 DIAGNOSIS — I69951 Hemiplegia and hemiparesis following unspecified cerebrovascular disease affecting right dominant side: Secondary | ICD-10-CM | POA: Diagnosis not present

## 2015-12-31 DIAGNOSIS — W050XXD Fall from non-moving wheelchair, subsequent encounter: Secondary | ICD-10-CM | POA: Diagnosis not present

## 2015-12-31 DIAGNOSIS — Z993 Dependence on wheelchair: Secondary | ICD-10-CM | POA: Diagnosis not present

## 2015-12-31 DIAGNOSIS — I69851 Hemiplegia and hemiparesis following other cerebrovascular disease affecting right dominant side: Secondary | ICD-10-CM | POA: Diagnosis not present

## 2015-12-31 DIAGNOSIS — I69952 Hemiplegia and hemiparesis following unspecified cerebrovascular disease affecting left dominant side: Secondary | ICD-10-CM | POA: Diagnosis not present

## 2015-12-31 DIAGNOSIS — M6281 Muscle weakness (generalized): Secondary | ICD-10-CM | POA: Diagnosis not present

## 2016-01-01 DIAGNOSIS — W050XXD Fall from non-moving wheelchair, subsequent encounter: Secondary | ICD-10-CM | POA: Diagnosis not present

## 2016-01-01 DIAGNOSIS — Z993 Dependence on wheelchair: Secondary | ICD-10-CM | POA: Diagnosis not present

## 2016-01-01 DIAGNOSIS — I69951 Hemiplegia and hemiparesis following unspecified cerebrovascular disease affecting right dominant side: Secondary | ICD-10-CM | POA: Diagnosis not present

## 2016-01-01 DIAGNOSIS — I69952 Hemiplegia and hemiparesis following unspecified cerebrovascular disease affecting left dominant side: Secondary | ICD-10-CM | POA: Diagnosis not present

## 2016-01-01 DIAGNOSIS — I69851 Hemiplegia and hemiparesis following other cerebrovascular disease affecting right dominant side: Secondary | ICD-10-CM | POA: Diagnosis not present

## 2016-01-01 DIAGNOSIS — M6281 Muscle weakness (generalized): Secondary | ICD-10-CM | POA: Diagnosis not present

## 2016-01-14 DIAGNOSIS — B351 Tinea unguium: Secondary | ICD-10-CM | POA: Diagnosis not present

## 2016-01-14 DIAGNOSIS — M79672 Pain in left foot: Secondary | ICD-10-CM | POA: Diagnosis not present

## 2016-01-14 DIAGNOSIS — M79671 Pain in right foot: Secondary | ICD-10-CM | POA: Diagnosis not present

## 2016-01-14 DIAGNOSIS — G8190 Hemiplegia, unspecified affecting unspecified side: Secondary | ICD-10-CM | POA: Diagnosis not present

## 2016-01-24 DIAGNOSIS — F0391 Unspecified dementia with behavioral disturbance: Secondary | ICD-10-CM | POA: Diagnosis not present

## 2016-01-24 DIAGNOSIS — I69359 Hemiplegia and hemiparesis following cerebral infarction affecting unspecified side: Secondary | ICD-10-CM | POA: Diagnosis not present

## 2016-01-24 DIAGNOSIS — I10 Essential (primary) hypertension: Secondary | ICD-10-CM | POA: Diagnosis not present

## 2016-01-24 DIAGNOSIS — F322 Major depressive disorder, single episode, severe without psychotic features: Secondary | ICD-10-CM | POA: Diagnosis not present

## 2016-01-24 DIAGNOSIS — I82401 Acute embolism and thrombosis of unspecified deep veins of right lower extremity: Secondary | ICD-10-CM | POA: Diagnosis not present

## 2016-02-28 DIAGNOSIS — I82401 Acute embolism and thrombosis of unspecified deep veins of right lower extremity: Secondary | ICD-10-CM | POA: Diagnosis not present

## 2016-02-28 DIAGNOSIS — F322 Major depressive disorder, single episode, severe without psychotic features: Secondary | ICD-10-CM | POA: Diagnosis not present

## 2016-02-28 DIAGNOSIS — I69359 Hemiplegia and hemiparesis following cerebral infarction affecting unspecified side: Secondary | ICD-10-CM | POA: Diagnosis not present

## 2016-02-28 DIAGNOSIS — R569 Unspecified convulsions: Secondary | ICD-10-CM | POA: Diagnosis not present

## 2016-02-28 DIAGNOSIS — K5900 Constipation, unspecified: Secondary | ICD-10-CM | POA: Diagnosis not present

## 2016-02-28 DIAGNOSIS — I10 Essential (primary) hypertension: Secondary | ICD-10-CM | POA: Diagnosis not present

## 2016-03-27 DIAGNOSIS — I69359 Hemiplegia and hemiparesis following cerebral infarction affecting unspecified side: Secondary | ICD-10-CM | POA: Diagnosis not present

## 2016-03-27 DIAGNOSIS — I890 Lymphedema, not elsewhere classified: Secondary | ICD-10-CM | POA: Diagnosis not present

## 2016-03-27 DIAGNOSIS — F0390 Unspecified dementia without behavioral disturbance: Secondary | ICD-10-CM | POA: Diagnosis not present

## 2016-03-27 DIAGNOSIS — I82409 Acute embolism and thrombosis of unspecified deep veins of unspecified lower extremity: Secondary | ICD-10-CM | POA: Diagnosis not present

## 2016-03-27 DIAGNOSIS — I10 Essential (primary) hypertension: Secondary | ICD-10-CM | POA: Diagnosis not present

## 2016-03-29 DIAGNOSIS — M25641 Stiffness of right hand, not elsewhere classified: Secondary | ICD-10-CM | POA: Diagnosis not present

## 2016-03-29 DIAGNOSIS — I6990 Unspecified sequelae of unspecified cerebrovascular disease: Secondary | ICD-10-CM | POA: Diagnosis not present

## 2016-04-01 DIAGNOSIS — I6990 Unspecified sequelae of unspecified cerebrovascular disease: Secondary | ICD-10-CM | POA: Diagnosis not present

## 2016-04-01 DIAGNOSIS — M25641 Stiffness of right hand, not elsewhere classified: Secondary | ICD-10-CM | POA: Diagnosis not present

## 2016-04-02 DIAGNOSIS — M25641 Stiffness of right hand, not elsewhere classified: Secondary | ICD-10-CM | POA: Diagnosis not present

## 2016-04-02 DIAGNOSIS — I6990 Unspecified sequelae of unspecified cerebrovascular disease: Secondary | ICD-10-CM | POA: Diagnosis not present

## 2016-04-03 DIAGNOSIS — M25641 Stiffness of right hand, not elsewhere classified: Secondary | ICD-10-CM | POA: Diagnosis not present

## 2016-04-03 DIAGNOSIS — I6990 Unspecified sequelae of unspecified cerebrovascular disease: Secondary | ICD-10-CM | POA: Diagnosis not present

## 2016-04-04 DIAGNOSIS — M25641 Stiffness of right hand, not elsewhere classified: Secondary | ICD-10-CM | POA: Diagnosis not present

## 2016-04-04 DIAGNOSIS — I6990 Unspecified sequelae of unspecified cerebrovascular disease: Secondary | ICD-10-CM | POA: Diagnosis not present

## 2016-04-05 DIAGNOSIS — M25641 Stiffness of right hand, not elsewhere classified: Secondary | ICD-10-CM | POA: Diagnosis not present

## 2016-04-05 DIAGNOSIS — I6990 Unspecified sequelae of unspecified cerebrovascular disease: Secondary | ICD-10-CM | POA: Diagnosis not present

## 2016-04-07 DIAGNOSIS — I6990 Unspecified sequelae of unspecified cerebrovascular disease: Secondary | ICD-10-CM | POA: Diagnosis not present

## 2016-04-07 DIAGNOSIS — M25641 Stiffness of right hand, not elsewhere classified: Secondary | ICD-10-CM | POA: Diagnosis not present

## 2016-04-08 DIAGNOSIS — I6990 Unspecified sequelae of unspecified cerebrovascular disease: Secondary | ICD-10-CM | POA: Diagnosis not present

## 2016-04-08 DIAGNOSIS — M25641 Stiffness of right hand, not elsewhere classified: Secondary | ICD-10-CM | POA: Diagnosis not present

## 2016-04-09 DIAGNOSIS — I6990 Unspecified sequelae of unspecified cerebrovascular disease: Secondary | ICD-10-CM | POA: Diagnosis not present

## 2016-04-09 DIAGNOSIS — M25641 Stiffness of right hand, not elsewhere classified: Secondary | ICD-10-CM | POA: Diagnosis not present

## 2016-04-10 DIAGNOSIS — I6990 Unspecified sequelae of unspecified cerebrovascular disease: Secondary | ICD-10-CM | POA: Diagnosis not present

## 2016-04-10 DIAGNOSIS — M25641 Stiffness of right hand, not elsewhere classified: Secondary | ICD-10-CM | POA: Diagnosis not present

## 2016-04-11 DIAGNOSIS — I6990 Unspecified sequelae of unspecified cerebrovascular disease: Secondary | ICD-10-CM | POA: Diagnosis not present

## 2016-04-11 DIAGNOSIS — M25641 Stiffness of right hand, not elsewhere classified: Secondary | ICD-10-CM | POA: Diagnosis not present

## 2016-04-11 DIAGNOSIS — Z79899 Other long term (current) drug therapy: Secondary | ICD-10-CM | POA: Diagnosis not present

## 2016-04-11 DIAGNOSIS — D649 Anemia, unspecified: Secondary | ICD-10-CM | POA: Diagnosis not present

## 2016-04-11 DIAGNOSIS — N289 Disorder of kidney and ureter, unspecified: Secondary | ICD-10-CM | POA: Diagnosis not present

## 2016-04-11 DIAGNOSIS — R569 Unspecified convulsions: Secondary | ICD-10-CM | POA: Diagnosis not present

## 2016-04-15 DIAGNOSIS — I6990 Unspecified sequelae of unspecified cerebrovascular disease: Secondary | ICD-10-CM | POA: Diagnosis not present

## 2016-04-15 DIAGNOSIS — M25641 Stiffness of right hand, not elsewhere classified: Secondary | ICD-10-CM | POA: Diagnosis not present

## 2016-04-16 DIAGNOSIS — M25641 Stiffness of right hand, not elsewhere classified: Secondary | ICD-10-CM | POA: Diagnosis not present

## 2016-04-16 DIAGNOSIS — I6990 Unspecified sequelae of unspecified cerebrovascular disease: Secondary | ICD-10-CM | POA: Diagnosis not present

## 2016-04-17 DIAGNOSIS — I6990 Unspecified sequelae of unspecified cerebrovascular disease: Secondary | ICD-10-CM | POA: Diagnosis not present

## 2016-04-17 DIAGNOSIS — E7800 Pure hypercholesterolemia, unspecified: Secondary | ICD-10-CM | POA: Diagnosis not present

## 2016-04-17 DIAGNOSIS — Z79899 Other long term (current) drug therapy: Secondary | ICD-10-CM | POA: Diagnosis not present

## 2016-04-17 DIAGNOSIS — D649 Anemia, unspecified: Secondary | ICD-10-CM | POA: Diagnosis not present

## 2016-04-17 DIAGNOSIS — M25641 Stiffness of right hand, not elsewhere classified: Secondary | ICD-10-CM | POA: Diagnosis not present

## 2016-04-17 DIAGNOSIS — R569 Unspecified convulsions: Secondary | ICD-10-CM | POA: Diagnosis not present

## 2016-04-18 DIAGNOSIS — M25641 Stiffness of right hand, not elsewhere classified: Secondary | ICD-10-CM | POA: Diagnosis not present

## 2016-04-18 DIAGNOSIS — I6990 Unspecified sequelae of unspecified cerebrovascular disease: Secondary | ICD-10-CM | POA: Diagnosis not present

## 2016-04-24 DIAGNOSIS — I69359 Hemiplegia and hemiparesis following cerebral infarction affecting unspecified side: Secondary | ICD-10-CM | POA: Diagnosis not present

## 2016-04-24 DIAGNOSIS — F0390 Unspecified dementia without behavioral disturbance: Secondary | ICD-10-CM | POA: Diagnosis not present

## 2016-04-24 DIAGNOSIS — R1310 Dysphagia, unspecified: Secondary | ICD-10-CM | POA: Diagnosis not present

## 2016-04-24 DIAGNOSIS — F322 Major depressive disorder, single episode, severe without psychotic features: Secondary | ICD-10-CM | POA: Diagnosis not present

## 2016-04-24 DIAGNOSIS — R569 Unspecified convulsions: Secondary | ICD-10-CM | POA: Diagnosis not present

## 2016-04-24 DIAGNOSIS — I82409 Acute embolism and thrombosis of unspecified deep veins of unspecified lower extremity: Secondary | ICD-10-CM | POA: Diagnosis not present

## 2016-05-22 DIAGNOSIS — R600 Localized edema: Secondary | ICD-10-CM | POA: Diagnosis not present

## 2016-05-22 DIAGNOSIS — F322 Major depressive disorder, single episode, severe without psychotic features: Secondary | ICD-10-CM | POA: Diagnosis not present

## 2016-05-22 DIAGNOSIS — I82409 Acute embolism and thrombosis of unspecified deep veins of unspecified lower extremity: Secondary | ICD-10-CM | POA: Diagnosis not present

## 2016-05-22 DIAGNOSIS — I739 Peripheral vascular disease, unspecified: Secondary | ICD-10-CM | POA: Diagnosis not present

## 2016-05-22 DIAGNOSIS — F0390 Unspecified dementia without behavioral disturbance: Secondary | ICD-10-CM | POA: Diagnosis not present

## 2016-05-29 DIAGNOSIS — M25551 Pain in right hip: Secondary | ICD-10-CM | POA: Diagnosis not present

## 2016-05-29 DIAGNOSIS — M79651 Pain in right thigh: Secondary | ICD-10-CM | POA: Diagnosis not present

## 2016-06-17 DIAGNOSIS — I69952 Hemiplegia and hemiparesis following unspecified cerebrovascular disease affecting left dominant side: Secondary | ICD-10-CM | POA: Diagnosis not present

## 2016-06-17 DIAGNOSIS — R293 Abnormal posture: Secondary | ICD-10-CM | POA: Diagnosis not present

## 2016-06-17 DIAGNOSIS — G8101 Flaccid hemiplegia affecting right dominant side: Secondary | ICD-10-CM | POA: Diagnosis not present

## 2016-06-17 DIAGNOSIS — M6281 Muscle weakness (generalized): Secondary | ICD-10-CM | POA: Diagnosis not present

## 2016-06-17 DIAGNOSIS — I6990 Unspecified sequelae of unspecified cerebrovascular disease: Secondary | ICD-10-CM | POA: Diagnosis not present

## 2016-06-19 DIAGNOSIS — I82401 Acute embolism and thrombosis of unspecified deep veins of right lower extremity: Secondary | ICD-10-CM | POA: Diagnosis not present

## 2016-06-19 DIAGNOSIS — F0390 Unspecified dementia without behavioral disturbance: Secondary | ICD-10-CM | POA: Diagnosis not present

## 2016-06-19 DIAGNOSIS — R569 Unspecified convulsions: Secondary | ICD-10-CM | POA: Diagnosis not present

## 2016-06-19 DIAGNOSIS — N390 Urinary tract infection, site not specified: Secondary | ICD-10-CM | POA: Diagnosis not present

## 2016-06-19 DIAGNOSIS — R293 Abnormal posture: Secondary | ICD-10-CM | POA: Diagnosis not present

## 2016-06-19 DIAGNOSIS — I6990 Unspecified sequelae of unspecified cerebrovascular disease: Secondary | ICD-10-CM | POA: Diagnosis not present

## 2016-06-19 DIAGNOSIS — I69952 Hemiplegia and hemiparesis following unspecified cerebrovascular disease affecting left dominant side: Secondary | ICD-10-CM | POA: Diagnosis not present

## 2016-06-19 DIAGNOSIS — F322 Major depressive disorder, single episode, severe without psychotic features: Secondary | ICD-10-CM | POA: Diagnosis not present

## 2016-06-19 DIAGNOSIS — G8101 Flaccid hemiplegia affecting right dominant side: Secondary | ICD-10-CM | POA: Diagnosis not present

## 2016-06-19 DIAGNOSIS — M6281 Muscle weakness (generalized): Secondary | ICD-10-CM | POA: Diagnosis not present

## 2016-06-19 DIAGNOSIS — K529 Noninfective gastroenteritis and colitis, unspecified: Secondary | ICD-10-CM | POA: Diagnosis not present

## 2016-06-23 DIAGNOSIS — G8101 Flaccid hemiplegia affecting right dominant side: Secondary | ICD-10-CM | POA: Diagnosis not present

## 2016-06-23 DIAGNOSIS — I6990 Unspecified sequelae of unspecified cerebrovascular disease: Secondary | ICD-10-CM | POA: Diagnosis not present

## 2016-06-23 DIAGNOSIS — M6281 Muscle weakness (generalized): Secondary | ICD-10-CM | POA: Diagnosis not present

## 2016-06-23 DIAGNOSIS — R293 Abnormal posture: Secondary | ICD-10-CM | POA: Diagnosis not present

## 2016-06-23 DIAGNOSIS — I69952 Hemiplegia and hemiparesis following unspecified cerebrovascular disease affecting left dominant side: Secondary | ICD-10-CM | POA: Diagnosis not present

## 2016-06-27 DIAGNOSIS — M6281 Muscle weakness (generalized): Secondary | ICD-10-CM | POA: Diagnosis not present

## 2016-06-27 DIAGNOSIS — G8101 Flaccid hemiplegia affecting right dominant side: Secondary | ICD-10-CM | POA: Diagnosis not present

## 2016-06-27 DIAGNOSIS — I6990 Unspecified sequelae of unspecified cerebrovascular disease: Secondary | ICD-10-CM | POA: Diagnosis not present

## 2016-06-27 DIAGNOSIS — I69952 Hemiplegia and hemiparesis following unspecified cerebrovascular disease affecting left dominant side: Secondary | ICD-10-CM | POA: Diagnosis not present

## 2016-06-27 DIAGNOSIS — R293 Abnormal posture: Secondary | ICD-10-CM | POA: Diagnosis not present

## 2016-07-03 DIAGNOSIS — F39 Unspecified mood [affective] disorder: Secondary | ICD-10-CM | POA: Diagnosis not present

## 2016-07-03 DIAGNOSIS — F331 Major depressive disorder, recurrent, moderate: Secondary | ICD-10-CM | POA: Diagnosis not present

## 2016-07-05 DIAGNOSIS — I6990 Unspecified sequelae of unspecified cerebrovascular disease: Secondary | ICD-10-CM | POA: Diagnosis not present

## 2016-07-05 DIAGNOSIS — G8101 Flaccid hemiplegia affecting right dominant side: Secondary | ICD-10-CM | POA: Diagnosis not present

## 2016-07-05 DIAGNOSIS — I69952 Hemiplegia and hemiparesis following unspecified cerebrovascular disease affecting left dominant side: Secondary | ICD-10-CM | POA: Diagnosis not present

## 2016-07-05 DIAGNOSIS — M6281 Muscle weakness (generalized): Secondary | ICD-10-CM | POA: Diagnosis not present

## 2016-07-05 DIAGNOSIS — R293 Abnormal posture: Secondary | ICD-10-CM | POA: Diagnosis not present

## 2016-07-07 DIAGNOSIS — I6990 Unspecified sequelae of unspecified cerebrovascular disease: Secondary | ICD-10-CM | POA: Diagnosis not present

## 2016-07-07 DIAGNOSIS — R293 Abnormal posture: Secondary | ICD-10-CM | POA: Diagnosis not present

## 2016-07-07 DIAGNOSIS — M6281 Muscle weakness (generalized): Secondary | ICD-10-CM | POA: Diagnosis not present

## 2016-07-07 DIAGNOSIS — G8101 Flaccid hemiplegia affecting right dominant side: Secondary | ICD-10-CM | POA: Diagnosis not present

## 2016-07-07 DIAGNOSIS — I69952 Hemiplegia and hemiparesis following unspecified cerebrovascular disease affecting left dominant side: Secondary | ICD-10-CM | POA: Diagnosis not present

## 2016-07-08 DIAGNOSIS — M6281 Muscle weakness (generalized): Secondary | ICD-10-CM | POA: Diagnosis not present

## 2016-07-08 DIAGNOSIS — I6990 Unspecified sequelae of unspecified cerebrovascular disease: Secondary | ICD-10-CM | POA: Diagnosis not present

## 2016-07-08 DIAGNOSIS — G8101 Flaccid hemiplegia affecting right dominant side: Secondary | ICD-10-CM | POA: Diagnosis not present

## 2016-07-08 DIAGNOSIS — R293 Abnormal posture: Secondary | ICD-10-CM | POA: Diagnosis not present

## 2016-07-08 DIAGNOSIS — I69952 Hemiplegia and hemiparesis following unspecified cerebrovascular disease affecting left dominant side: Secondary | ICD-10-CM | POA: Diagnosis not present

## 2016-07-09 DIAGNOSIS — R293 Abnormal posture: Secondary | ICD-10-CM | POA: Diagnosis not present

## 2016-07-09 DIAGNOSIS — I6990 Unspecified sequelae of unspecified cerebrovascular disease: Secondary | ICD-10-CM | POA: Diagnosis not present

## 2016-07-09 DIAGNOSIS — M6281 Muscle weakness (generalized): Secondary | ICD-10-CM | POA: Diagnosis not present

## 2016-07-09 DIAGNOSIS — I69952 Hemiplegia and hemiparesis following unspecified cerebrovascular disease affecting left dominant side: Secondary | ICD-10-CM | POA: Diagnosis not present

## 2016-07-09 DIAGNOSIS — G8101 Flaccid hemiplegia affecting right dominant side: Secondary | ICD-10-CM | POA: Diagnosis not present

## 2016-07-10 DIAGNOSIS — R293 Abnormal posture: Secondary | ICD-10-CM | POA: Diagnosis not present

## 2016-07-10 DIAGNOSIS — I6990 Unspecified sequelae of unspecified cerebrovascular disease: Secondary | ICD-10-CM | POA: Diagnosis not present

## 2016-07-10 DIAGNOSIS — M6281 Muscle weakness (generalized): Secondary | ICD-10-CM | POA: Diagnosis not present

## 2016-07-10 DIAGNOSIS — G8101 Flaccid hemiplegia affecting right dominant side: Secondary | ICD-10-CM | POA: Diagnosis not present

## 2016-07-10 DIAGNOSIS — I69952 Hemiplegia and hemiparesis following unspecified cerebrovascular disease affecting left dominant side: Secondary | ICD-10-CM | POA: Diagnosis not present

## 2016-07-11 DIAGNOSIS — I6990 Unspecified sequelae of unspecified cerebrovascular disease: Secondary | ICD-10-CM | POA: Diagnosis not present

## 2016-07-11 DIAGNOSIS — M6281 Muscle weakness (generalized): Secondary | ICD-10-CM | POA: Diagnosis not present

## 2016-07-11 DIAGNOSIS — R293 Abnormal posture: Secondary | ICD-10-CM | POA: Diagnosis not present

## 2016-07-11 DIAGNOSIS — G8101 Flaccid hemiplegia affecting right dominant side: Secondary | ICD-10-CM | POA: Diagnosis not present

## 2016-07-11 DIAGNOSIS — I69952 Hemiplegia and hemiparesis following unspecified cerebrovascular disease affecting left dominant side: Secondary | ICD-10-CM | POA: Diagnosis not present

## 2016-07-14 DIAGNOSIS — I6990 Unspecified sequelae of unspecified cerebrovascular disease: Secondary | ICD-10-CM | POA: Diagnosis not present

## 2016-07-14 DIAGNOSIS — R293 Abnormal posture: Secondary | ICD-10-CM | POA: Diagnosis not present

## 2016-07-14 DIAGNOSIS — M6281 Muscle weakness (generalized): Secondary | ICD-10-CM | POA: Diagnosis not present

## 2016-07-14 DIAGNOSIS — I69952 Hemiplegia and hemiparesis following unspecified cerebrovascular disease affecting left dominant side: Secondary | ICD-10-CM | POA: Diagnosis not present

## 2016-07-14 DIAGNOSIS — G8101 Flaccid hemiplegia affecting right dominant side: Secondary | ICD-10-CM | POA: Diagnosis not present

## 2016-07-15 DIAGNOSIS — I6990 Unspecified sequelae of unspecified cerebrovascular disease: Secondary | ICD-10-CM | POA: Diagnosis not present

## 2016-07-15 DIAGNOSIS — R293 Abnormal posture: Secondary | ICD-10-CM | POA: Diagnosis not present

## 2016-07-15 DIAGNOSIS — G8101 Flaccid hemiplegia affecting right dominant side: Secondary | ICD-10-CM | POA: Diagnosis not present

## 2016-07-15 DIAGNOSIS — M6281 Muscle weakness (generalized): Secondary | ICD-10-CM | POA: Diagnosis not present

## 2016-07-15 DIAGNOSIS — I69952 Hemiplegia and hemiparesis following unspecified cerebrovascular disease affecting left dominant side: Secondary | ICD-10-CM | POA: Diagnosis not present

## 2016-07-16 DIAGNOSIS — I6990 Unspecified sequelae of unspecified cerebrovascular disease: Secondary | ICD-10-CM | POA: Diagnosis not present

## 2016-07-16 DIAGNOSIS — I69952 Hemiplegia and hemiparesis following unspecified cerebrovascular disease affecting left dominant side: Secondary | ICD-10-CM | POA: Diagnosis not present

## 2016-07-16 DIAGNOSIS — G8101 Flaccid hemiplegia affecting right dominant side: Secondary | ICD-10-CM | POA: Diagnosis not present

## 2016-07-16 DIAGNOSIS — M6281 Muscle weakness (generalized): Secondary | ICD-10-CM | POA: Diagnosis not present

## 2016-07-16 DIAGNOSIS — R293 Abnormal posture: Secondary | ICD-10-CM | POA: Diagnosis not present

## 2016-07-17 DIAGNOSIS — I6990 Unspecified sequelae of unspecified cerebrovascular disease: Secondary | ICD-10-CM | POA: Diagnosis not present

## 2016-07-17 DIAGNOSIS — I69952 Hemiplegia and hemiparesis following unspecified cerebrovascular disease affecting left dominant side: Secondary | ICD-10-CM | POA: Diagnosis not present

## 2016-07-17 DIAGNOSIS — M6281 Muscle weakness (generalized): Secondary | ICD-10-CM | POA: Diagnosis not present

## 2016-07-17 DIAGNOSIS — G8101 Flaccid hemiplegia affecting right dominant side: Secondary | ICD-10-CM | POA: Diagnosis not present

## 2016-07-17 DIAGNOSIS — R293 Abnormal posture: Secondary | ICD-10-CM | POA: Diagnosis not present

## 2016-07-18 DIAGNOSIS — M6281 Muscle weakness (generalized): Secondary | ICD-10-CM | POA: Diagnosis not present

## 2016-07-18 DIAGNOSIS — G8101 Flaccid hemiplegia affecting right dominant side: Secondary | ICD-10-CM | POA: Diagnosis not present

## 2016-07-18 DIAGNOSIS — I69952 Hemiplegia and hemiparesis following unspecified cerebrovascular disease affecting left dominant side: Secondary | ICD-10-CM | POA: Diagnosis not present

## 2016-07-18 DIAGNOSIS — I6990 Unspecified sequelae of unspecified cerebrovascular disease: Secondary | ICD-10-CM | POA: Diagnosis not present

## 2016-07-18 DIAGNOSIS — R293 Abnormal posture: Secondary | ICD-10-CM | POA: Diagnosis not present

## 2016-07-19 DIAGNOSIS — R293 Abnormal posture: Secondary | ICD-10-CM | POA: Diagnosis not present

## 2016-07-19 DIAGNOSIS — I6990 Unspecified sequelae of unspecified cerebrovascular disease: Secondary | ICD-10-CM | POA: Diagnosis not present

## 2016-07-19 DIAGNOSIS — I69952 Hemiplegia and hemiparesis following unspecified cerebrovascular disease affecting left dominant side: Secondary | ICD-10-CM | POA: Diagnosis not present

## 2016-07-19 DIAGNOSIS — G8101 Flaccid hemiplegia affecting right dominant side: Secondary | ICD-10-CM | POA: Diagnosis not present

## 2016-07-19 DIAGNOSIS — M6281 Muscle weakness (generalized): Secondary | ICD-10-CM | POA: Diagnosis not present

## 2016-07-21 DIAGNOSIS — M79674 Pain in right toe(s): Secondary | ICD-10-CM | POA: Diagnosis not present

## 2016-07-21 DIAGNOSIS — I6990 Unspecified sequelae of unspecified cerebrovascular disease: Secondary | ICD-10-CM | POA: Diagnosis not present

## 2016-07-21 DIAGNOSIS — R293 Abnormal posture: Secondary | ICD-10-CM | POA: Diagnosis not present

## 2016-07-21 DIAGNOSIS — I70203 Unspecified atherosclerosis of native arteries of extremities, bilateral legs: Secondary | ICD-10-CM | POA: Diagnosis not present

## 2016-07-21 DIAGNOSIS — M79675 Pain in left toe(s): Secondary | ICD-10-CM | POA: Diagnosis not present

## 2016-07-21 DIAGNOSIS — I69952 Hemiplegia and hemiparesis following unspecified cerebrovascular disease affecting left dominant side: Secondary | ICD-10-CM | POA: Diagnosis not present

## 2016-07-21 DIAGNOSIS — M6281 Muscle weakness (generalized): Secondary | ICD-10-CM | POA: Diagnosis not present

## 2016-07-21 DIAGNOSIS — G8101 Flaccid hemiplegia affecting right dominant side: Secondary | ICD-10-CM | POA: Diagnosis not present

## 2016-07-21 DIAGNOSIS — B351 Tinea unguium: Secondary | ICD-10-CM | POA: Diagnosis not present

## 2016-07-22 DIAGNOSIS — G8101 Flaccid hemiplegia affecting right dominant side: Secondary | ICD-10-CM | POA: Diagnosis not present

## 2016-07-22 DIAGNOSIS — I69952 Hemiplegia and hemiparesis following unspecified cerebrovascular disease affecting left dominant side: Secondary | ICD-10-CM | POA: Diagnosis not present

## 2016-07-22 DIAGNOSIS — M6281 Muscle weakness (generalized): Secondary | ICD-10-CM | POA: Diagnosis not present

## 2016-07-22 DIAGNOSIS — I6990 Unspecified sequelae of unspecified cerebrovascular disease: Secondary | ICD-10-CM | POA: Diagnosis not present

## 2016-07-22 DIAGNOSIS — R293 Abnormal posture: Secondary | ICD-10-CM | POA: Diagnosis not present

## 2016-07-23 DIAGNOSIS — I6990 Unspecified sequelae of unspecified cerebrovascular disease: Secondary | ICD-10-CM | POA: Diagnosis not present

## 2016-07-23 DIAGNOSIS — M6281 Muscle weakness (generalized): Secondary | ICD-10-CM | POA: Diagnosis not present

## 2016-07-23 DIAGNOSIS — I69952 Hemiplegia and hemiparesis following unspecified cerebrovascular disease affecting left dominant side: Secondary | ICD-10-CM | POA: Diagnosis not present

## 2016-07-23 DIAGNOSIS — R293 Abnormal posture: Secondary | ICD-10-CM | POA: Diagnosis not present

## 2016-07-23 DIAGNOSIS — G8101 Flaccid hemiplegia affecting right dominant side: Secondary | ICD-10-CM | POA: Diagnosis not present

## 2016-07-24 DIAGNOSIS — M6281 Muscle weakness (generalized): Secondary | ICD-10-CM | POA: Diagnosis not present

## 2016-07-24 DIAGNOSIS — I82401 Acute embolism and thrombosis of unspecified deep veins of right lower extremity: Secondary | ICD-10-CM | POA: Diagnosis not present

## 2016-07-24 DIAGNOSIS — F0390 Unspecified dementia without behavioral disturbance: Secondary | ICD-10-CM | POA: Diagnosis not present

## 2016-07-24 DIAGNOSIS — I69952 Hemiplegia and hemiparesis following unspecified cerebrovascular disease affecting left dominant side: Secondary | ICD-10-CM | POA: Diagnosis not present

## 2016-07-24 DIAGNOSIS — I6990 Unspecified sequelae of unspecified cerebrovascular disease: Secondary | ICD-10-CM | POA: Diagnosis not present

## 2016-07-24 DIAGNOSIS — I10 Essential (primary) hypertension: Secondary | ICD-10-CM | POA: Diagnosis not present

## 2016-07-24 DIAGNOSIS — R569 Unspecified convulsions: Secondary | ICD-10-CM | POA: Diagnosis not present

## 2016-07-24 DIAGNOSIS — G8101 Flaccid hemiplegia affecting right dominant side: Secondary | ICD-10-CM | POA: Diagnosis not present

## 2016-07-24 DIAGNOSIS — I69359 Hemiplegia and hemiparesis following cerebral infarction affecting unspecified side: Secondary | ICD-10-CM | POA: Diagnosis not present

## 2016-07-24 DIAGNOSIS — F322 Major depressive disorder, single episode, severe without psychotic features: Secondary | ICD-10-CM | POA: Diagnosis not present

## 2016-07-24 DIAGNOSIS — R293 Abnormal posture: Secondary | ICD-10-CM | POA: Diagnosis not present

## 2016-07-24 DIAGNOSIS — F411 Generalized anxiety disorder: Secondary | ICD-10-CM | POA: Diagnosis not present

## 2016-07-25 DIAGNOSIS — M6281 Muscle weakness (generalized): Secondary | ICD-10-CM | POA: Diagnosis not present

## 2016-07-25 DIAGNOSIS — I6990 Unspecified sequelae of unspecified cerebrovascular disease: Secondary | ICD-10-CM | POA: Diagnosis not present

## 2016-07-25 DIAGNOSIS — I69952 Hemiplegia and hemiparesis following unspecified cerebrovascular disease affecting left dominant side: Secondary | ICD-10-CM | POA: Diagnosis not present

## 2016-07-25 DIAGNOSIS — G8101 Flaccid hemiplegia affecting right dominant side: Secondary | ICD-10-CM | POA: Diagnosis not present

## 2016-07-25 DIAGNOSIS — R293 Abnormal posture: Secondary | ICD-10-CM | POA: Diagnosis not present

## 2016-07-26 DIAGNOSIS — R293 Abnormal posture: Secondary | ICD-10-CM | POA: Diagnosis not present

## 2016-07-26 DIAGNOSIS — I6990 Unspecified sequelae of unspecified cerebrovascular disease: Secondary | ICD-10-CM | POA: Diagnosis not present

## 2016-07-26 DIAGNOSIS — M6281 Muscle weakness (generalized): Secondary | ICD-10-CM | POA: Diagnosis not present

## 2016-07-26 DIAGNOSIS — I69952 Hemiplegia and hemiparesis following unspecified cerebrovascular disease affecting left dominant side: Secondary | ICD-10-CM | POA: Diagnosis not present

## 2016-07-26 DIAGNOSIS — G8101 Flaccid hemiplegia affecting right dominant side: Secondary | ICD-10-CM | POA: Diagnosis not present

## 2016-07-28 DIAGNOSIS — R293 Abnormal posture: Secondary | ICD-10-CM | POA: Diagnosis not present

## 2016-07-28 DIAGNOSIS — M6281 Muscle weakness (generalized): Secondary | ICD-10-CM | POA: Diagnosis not present

## 2016-07-28 DIAGNOSIS — G8101 Flaccid hemiplegia affecting right dominant side: Secondary | ICD-10-CM | POA: Diagnosis not present

## 2016-07-28 DIAGNOSIS — I69952 Hemiplegia and hemiparesis following unspecified cerebrovascular disease affecting left dominant side: Secondary | ICD-10-CM | POA: Diagnosis not present

## 2016-07-28 DIAGNOSIS — I6990 Unspecified sequelae of unspecified cerebrovascular disease: Secondary | ICD-10-CM | POA: Diagnosis not present

## 2016-07-29 DIAGNOSIS — I6990 Unspecified sequelae of unspecified cerebrovascular disease: Secondary | ICD-10-CM | POA: Diagnosis not present

## 2016-07-29 DIAGNOSIS — G8101 Flaccid hemiplegia affecting right dominant side: Secondary | ICD-10-CM | POA: Diagnosis not present

## 2016-07-29 DIAGNOSIS — I69952 Hemiplegia and hemiparesis following unspecified cerebrovascular disease affecting left dominant side: Secondary | ICD-10-CM | POA: Diagnosis not present

## 2016-07-29 DIAGNOSIS — M6281 Muscle weakness (generalized): Secondary | ICD-10-CM | POA: Diagnosis not present

## 2016-07-29 DIAGNOSIS — R293 Abnormal posture: Secondary | ICD-10-CM | POA: Diagnosis not present

## 2016-07-30 DIAGNOSIS — R293 Abnormal posture: Secondary | ICD-10-CM | POA: Diagnosis not present

## 2016-07-30 DIAGNOSIS — I6990 Unspecified sequelae of unspecified cerebrovascular disease: Secondary | ICD-10-CM | POA: Diagnosis not present

## 2016-07-30 DIAGNOSIS — G8101 Flaccid hemiplegia affecting right dominant side: Secondary | ICD-10-CM | POA: Diagnosis not present

## 2016-07-30 DIAGNOSIS — M6281 Muscle weakness (generalized): Secondary | ICD-10-CM | POA: Diagnosis not present

## 2016-07-30 DIAGNOSIS — I69952 Hemiplegia and hemiparesis following unspecified cerebrovascular disease affecting left dominant side: Secondary | ICD-10-CM | POA: Diagnosis not present

## 2016-07-31 DIAGNOSIS — G8101 Flaccid hemiplegia affecting right dominant side: Secondary | ICD-10-CM | POA: Diagnosis not present

## 2016-07-31 DIAGNOSIS — I69952 Hemiplegia and hemiparesis following unspecified cerebrovascular disease affecting left dominant side: Secondary | ICD-10-CM | POA: Diagnosis not present

## 2016-07-31 DIAGNOSIS — M6281 Muscle weakness (generalized): Secondary | ICD-10-CM | POA: Diagnosis not present

## 2016-07-31 DIAGNOSIS — R293 Abnormal posture: Secondary | ICD-10-CM | POA: Diagnosis not present

## 2016-07-31 DIAGNOSIS — I6990 Unspecified sequelae of unspecified cerebrovascular disease: Secondary | ICD-10-CM | POA: Diagnosis not present

## 2016-08-01 DIAGNOSIS — I69952 Hemiplegia and hemiparesis following unspecified cerebrovascular disease affecting left dominant side: Secondary | ICD-10-CM | POA: Diagnosis not present

## 2016-08-01 DIAGNOSIS — M6281 Muscle weakness (generalized): Secondary | ICD-10-CM | POA: Diagnosis not present

## 2016-08-01 DIAGNOSIS — I6990 Unspecified sequelae of unspecified cerebrovascular disease: Secondary | ICD-10-CM | POA: Diagnosis not present

## 2016-08-01 DIAGNOSIS — R293 Abnormal posture: Secondary | ICD-10-CM | POA: Diagnosis not present

## 2016-08-01 DIAGNOSIS — G8101 Flaccid hemiplegia affecting right dominant side: Secondary | ICD-10-CM | POA: Diagnosis not present

## 2016-08-04 DIAGNOSIS — I6990 Unspecified sequelae of unspecified cerebrovascular disease: Secondary | ICD-10-CM | POA: Diagnosis not present

## 2016-08-04 DIAGNOSIS — G8101 Flaccid hemiplegia affecting right dominant side: Secondary | ICD-10-CM | POA: Diagnosis not present

## 2016-08-04 DIAGNOSIS — I69952 Hemiplegia and hemiparesis following unspecified cerebrovascular disease affecting left dominant side: Secondary | ICD-10-CM | POA: Diagnosis not present

## 2016-08-04 DIAGNOSIS — R293 Abnormal posture: Secondary | ICD-10-CM | POA: Diagnosis not present

## 2016-08-04 DIAGNOSIS — M6281 Muscle weakness (generalized): Secondary | ICD-10-CM | POA: Diagnosis not present

## 2016-08-06 DIAGNOSIS — R293 Abnormal posture: Secondary | ICD-10-CM | POA: Diagnosis not present

## 2016-08-06 DIAGNOSIS — M6281 Muscle weakness (generalized): Secondary | ICD-10-CM | POA: Diagnosis not present

## 2016-08-06 DIAGNOSIS — I69952 Hemiplegia and hemiparesis following unspecified cerebrovascular disease affecting left dominant side: Secondary | ICD-10-CM | POA: Diagnosis not present

## 2016-08-06 DIAGNOSIS — I6990 Unspecified sequelae of unspecified cerebrovascular disease: Secondary | ICD-10-CM | POA: Diagnosis not present

## 2016-08-06 DIAGNOSIS — G8101 Flaccid hemiplegia affecting right dominant side: Secondary | ICD-10-CM | POA: Diagnosis not present

## 2016-08-08 DIAGNOSIS — G8101 Flaccid hemiplegia affecting right dominant side: Secondary | ICD-10-CM | POA: Diagnosis not present

## 2016-08-08 DIAGNOSIS — R293 Abnormal posture: Secondary | ICD-10-CM | POA: Diagnosis not present

## 2016-08-08 DIAGNOSIS — I6990 Unspecified sequelae of unspecified cerebrovascular disease: Secondary | ICD-10-CM | POA: Diagnosis not present

## 2016-08-14 DIAGNOSIS — R569 Unspecified convulsions: Secondary | ICD-10-CM | POA: Diagnosis not present

## 2016-08-14 DIAGNOSIS — I69359 Hemiplegia and hemiparesis following cerebral infarction affecting unspecified side: Secondary | ICD-10-CM | POA: Diagnosis not present

## 2016-08-14 DIAGNOSIS — F322 Major depressive disorder, single episode, severe without psychotic features: Secondary | ICD-10-CM | POA: Diagnosis not present

## 2016-08-14 DIAGNOSIS — F0391 Unspecified dementia with behavioral disturbance: Secondary | ICD-10-CM | POA: Diagnosis not present

## 2016-08-14 DIAGNOSIS — F411 Generalized anxiety disorder: Secondary | ICD-10-CM | POA: Diagnosis not present

## 2016-08-14 DIAGNOSIS — I82401 Acute embolism and thrombosis of unspecified deep veins of right lower extremity: Secondary | ICD-10-CM | POA: Diagnosis not present

## 2016-08-14 DIAGNOSIS — I10 Essential (primary) hypertension: Secondary | ICD-10-CM | POA: Diagnosis not present

## 2016-09-11 DIAGNOSIS — F331 Major depressive disorder, recurrent, moderate: Secondary | ICD-10-CM | POA: Diagnosis not present

## 2016-09-11 DIAGNOSIS — F39 Unspecified mood [affective] disorder: Secondary | ICD-10-CM | POA: Diagnosis not present

## 2016-09-18 DIAGNOSIS — I82401 Acute embolism and thrombosis of unspecified deep veins of right lower extremity: Secondary | ICD-10-CM | POA: Diagnosis not present

## 2016-09-18 DIAGNOSIS — R1310 Dysphagia, unspecified: Secondary | ICD-10-CM | POA: Diagnosis not present

## 2016-09-18 DIAGNOSIS — F329 Major depressive disorder, single episode, unspecified: Secondary | ICD-10-CM | POA: Diagnosis not present

## 2016-09-18 DIAGNOSIS — I69359 Hemiplegia and hemiparesis following cerebral infarction affecting unspecified side: Secondary | ICD-10-CM | POA: Diagnosis not present

## 2016-09-18 DIAGNOSIS — I10 Essential (primary) hypertension: Secondary | ICD-10-CM | POA: Diagnosis not present

## 2016-09-18 DIAGNOSIS — R569 Unspecified convulsions: Secondary | ICD-10-CM | POA: Diagnosis not present

## 2016-09-24 DIAGNOSIS — H25813 Combined forms of age-related cataract, bilateral: Secondary | ICD-10-CM | POA: Diagnosis not present

## 2016-09-24 DIAGNOSIS — Z7901 Long term (current) use of anticoagulants: Secondary | ICD-10-CM | POA: Diagnosis not present

## 2016-09-30 DIAGNOSIS — R41841 Cognitive communication deficit: Secondary | ICD-10-CM | POA: Diagnosis not present

## 2016-09-30 DIAGNOSIS — I6990 Unspecified sequelae of unspecified cerebrovascular disease: Secondary | ICD-10-CM | POA: Diagnosis not present

## 2016-09-30 DIAGNOSIS — R1311 Dysphagia, oral phase: Secondary | ICD-10-CM | POA: Diagnosis not present

## 2016-10-08 DIAGNOSIS — I70203 Unspecified atherosclerosis of native arteries of extremities, bilateral legs: Secondary | ICD-10-CM | POA: Diagnosis not present

## 2016-10-08 DIAGNOSIS — B351 Tinea unguium: Secondary | ICD-10-CM | POA: Diagnosis not present

## 2016-10-08 DIAGNOSIS — M79674 Pain in right toe(s): Secondary | ICD-10-CM | POA: Diagnosis not present

## 2016-10-08 DIAGNOSIS — I739 Peripheral vascular disease, unspecified: Secondary | ICD-10-CM | POA: Diagnosis not present

## 2016-10-09 DIAGNOSIS — N289 Disorder of kidney and ureter, unspecified: Secondary | ICD-10-CM | POA: Diagnosis not present

## 2016-10-09 DIAGNOSIS — Z79899 Other long term (current) drug therapy: Secondary | ICD-10-CM | POA: Diagnosis not present

## 2016-10-09 DIAGNOSIS — D649 Anemia, unspecified: Secondary | ICD-10-CM | POA: Diagnosis not present

## 2016-10-09 DIAGNOSIS — R569 Unspecified convulsions: Secondary | ICD-10-CM | POA: Diagnosis not present

## 2016-10-16 DIAGNOSIS — D649 Anemia, unspecified: Secondary | ICD-10-CM | POA: Diagnosis not present

## 2016-10-16 DIAGNOSIS — Z79899 Other long term (current) drug therapy: Secondary | ICD-10-CM | POA: Diagnosis not present

## 2016-10-16 DIAGNOSIS — R569 Unspecified convulsions: Secondary | ICD-10-CM | POA: Diagnosis not present

## 2016-10-23 DIAGNOSIS — F322 Major depressive disorder, single episode, severe without psychotic features: Secondary | ICD-10-CM | POA: Diagnosis not present

## 2016-10-23 DIAGNOSIS — I69359 Hemiplegia and hemiparesis following cerebral infarction affecting unspecified side: Secondary | ICD-10-CM | POA: Diagnosis not present

## 2016-10-23 DIAGNOSIS — R569 Unspecified convulsions: Secondary | ICD-10-CM | POA: Diagnosis not present

## 2016-10-23 DIAGNOSIS — I10 Essential (primary) hypertension: Secondary | ICD-10-CM | POA: Diagnosis not present

## 2016-10-23 DIAGNOSIS — F411 Generalized anxiety disorder: Secondary | ICD-10-CM | POA: Diagnosis not present

## 2016-10-23 DIAGNOSIS — I82401 Acute embolism and thrombosis of unspecified deep veins of right lower extremity: Secondary | ICD-10-CM | POA: Diagnosis not present

## 2016-10-23 DIAGNOSIS — I890 Lymphedema, not elsewhere classified: Secondary | ICD-10-CM | POA: Diagnosis not present

## 2016-11-20 DIAGNOSIS — G40909 Epilepsy, unspecified, not intractable, without status epilepticus: Secondary | ICD-10-CM | POA: Diagnosis not present

## 2016-11-20 DIAGNOSIS — F322 Major depressive disorder, single episode, severe without psychotic features: Secondary | ICD-10-CM | POA: Diagnosis not present

## 2016-11-20 DIAGNOSIS — I69359 Hemiplegia and hemiparesis following cerebral infarction affecting unspecified side: Secondary | ICD-10-CM | POA: Diagnosis not present

## 2016-11-20 DIAGNOSIS — I10 Essential (primary) hypertension: Secondary | ICD-10-CM | POA: Diagnosis not present

## 2016-11-20 DIAGNOSIS — I82401 Acute embolism and thrombosis of unspecified deep veins of right lower extremity: Secondary | ICD-10-CM | POA: Diagnosis not present

## 2016-11-28 DIAGNOSIS — R296 Repeated falls: Secondary | ICD-10-CM | POA: Diagnosis not present

## 2016-11-29 DIAGNOSIS — R296 Repeated falls: Secondary | ICD-10-CM | POA: Diagnosis not present

## 2016-12-01 DIAGNOSIS — R296 Repeated falls: Secondary | ICD-10-CM | POA: Diagnosis not present

## 2016-12-02 DIAGNOSIS — R296 Repeated falls: Secondary | ICD-10-CM | POA: Diagnosis not present

## 2016-12-03 DIAGNOSIS — R296 Repeated falls: Secondary | ICD-10-CM | POA: Diagnosis not present

## 2016-12-04 DIAGNOSIS — R296 Repeated falls: Secondary | ICD-10-CM | POA: Diagnosis not present

## 2016-12-04 DIAGNOSIS — F39 Unspecified mood [affective] disorder: Secondary | ICD-10-CM | POA: Diagnosis not present

## 2016-12-04 DIAGNOSIS — F331 Major depressive disorder, recurrent, moderate: Secondary | ICD-10-CM | POA: Diagnosis not present

## 2016-12-05 DIAGNOSIS — R296 Repeated falls: Secondary | ICD-10-CM | POA: Diagnosis not present

## 2016-12-07 DIAGNOSIS — R296 Repeated falls: Secondary | ICD-10-CM | POA: Diagnosis not present

## 2016-12-08 DIAGNOSIS — R296 Repeated falls: Secondary | ICD-10-CM | POA: Diagnosis not present

## 2016-12-10 DIAGNOSIS — R296 Repeated falls: Secondary | ICD-10-CM | POA: Diagnosis not present

## 2016-12-11 DIAGNOSIS — R296 Repeated falls: Secondary | ICD-10-CM | POA: Diagnosis not present

## 2016-12-12 DIAGNOSIS — R296 Repeated falls: Secondary | ICD-10-CM | POA: Diagnosis not present

## 2016-12-15 DIAGNOSIS — R296 Repeated falls: Secondary | ICD-10-CM | POA: Diagnosis not present

## 2016-12-16 DIAGNOSIS — R296 Repeated falls: Secondary | ICD-10-CM | POA: Diagnosis not present

## 2016-12-17 DIAGNOSIS — R296 Repeated falls: Secondary | ICD-10-CM | POA: Diagnosis not present

## 2016-12-18 DIAGNOSIS — F322 Major depressive disorder, single episode, severe without psychotic features: Secondary | ICD-10-CM | POA: Diagnosis not present

## 2016-12-18 DIAGNOSIS — R569 Unspecified convulsions: Secondary | ICD-10-CM | POA: Diagnosis not present

## 2016-12-18 DIAGNOSIS — I69359 Hemiplegia and hemiparesis following cerebral infarction affecting unspecified side: Secondary | ICD-10-CM | POA: Diagnosis not present

## 2016-12-18 DIAGNOSIS — I10 Essential (primary) hypertension: Secondary | ICD-10-CM | POA: Diagnosis not present

## 2016-12-18 DIAGNOSIS — F411 Generalized anxiety disorder: Secondary | ICD-10-CM | POA: Diagnosis not present

## 2016-12-18 DIAGNOSIS — I82401 Acute embolism and thrombosis of unspecified deep veins of right lower extremity: Secondary | ICD-10-CM | POA: Diagnosis not present

## 2016-12-18 DIAGNOSIS — R296 Repeated falls: Secondary | ICD-10-CM | POA: Diagnosis not present

## 2016-12-18 DIAGNOSIS — F0390 Unspecified dementia without behavioral disturbance: Secondary | ICD-10-CM | POA: Diagnosis not present

## 2016-12-19 DIAGNOSIS — R296 Repeated falls: Secondary | ICD-10-CM | POA: Diagnosis not present

## 2016-12-22 DIAGNOSIS — R296 Repeated falls: Secondary | ICD-10-CM | POA: Diagnosis not present

## 2016-12-23 DIAGNOSIS — R296 Repeated falls: Secondary | ICD-10-CM | POA: Diagnosis not present

## 2016-12-24 DIAGNOSIS — R296 Repeated falls: Secondary | ICD-10-CM | POA: Diagnosis not present

## 2016-12-25 DIAGNOSIS — R296 Repeated falls: Secondary | ICD-10-CM | POA: Diagnosis not present

## 2016-12-26 DIAGNOSIS — R296 Repeated falls: Secondary | ICD-10-CM | POA: Diagnosis not present

## 2016-12-29 DIAGNOSIS — R296 Repeated falls: Secondary | ICD-10-CM | POA: Diagnosis not present

## 2016-12-30 DIAGNOSIS — R296 Repeated falls: Secondary | ICD-10-CM | POA: Diagnosis not present

## 2016-12-31 DIAGNOSIS — R296 Repeated falls: Secondary | ICD-10-CM | POA: Diagnosis not present

## 2017-01-01 DIAGNOSIS — R296 Repeated falls: Secondary | ICD-10-CM | POA: Diagnosis not present

## 2017-01-02 DIAGNOSIS — R296 Repeated falls: Secondary | ICD-10-CM | POA: Diagnosis not present

## 2017-01-05 DIAGNOSIS — R296 Repeated falls: Secondary | ICD-10-CM | POA: Diagnosis not present

## 2017-01-06 DIAGNOSIS — R296 Repeated falls: Secondary | ICD-10-CM | POA: Diagnosis not present

## 2017-01-07 DIAGNOSIS — R296 Repeated falls: Secondary | ICD-10-CM | POA: Diagnosis not present

## 2017-01-08 DIAGNOSIS — R296 Repeated falls: Secondary | ICD-10-CM | POA: Diagnosis not present

## 2017-01-09 DIAGNOSIS — R296 Repeated falls: Secondary | ICD-10-CM | POA: Diagnosis not present

## 2017-01-12 DIAGNOSIS — R296 Repeated falls: Secondary | ICD-10-CM | POA: Diagnosis not present

## 2017-01-13 DIAGNOSIS — R296 Repeated falls: Secondary | ICD-10-CM | POA: Diagnosis not present

## 2017-01-14 DIAGNOSIS — R296 Repeated falls: Secondary | ICD-10-CM | POA: Diagnosis not present

## 2017-01-15 DIAGNOSIS — I82401 Acute embolism and thrombosis of unspecified deep veins of right lower extremity: Secondary | ICD-10-CM | POA: Diagnosis not present

## 2017-01-15 DIAGNOSIS — I10 Essential (primary) hypertension: Secondary | ICD-10-CM | POA: Diagnosis not present

## 2017-01-15 DIAGNOSIS — R1310 Dysphagia, unspecified: Secondary | ICD-10-CM | POA: Diagnosis not present

## 2017-01-15 DIAGNOSIS — F322 Major depressive disorder, single episode, severe without psychotic features: Secondary | ICD-10-CM | POA: Diagnosis not present

## 2017-01-15 DIAGNOSIS — R569 Unspecified convulsions: Secondary | ICD-10-CM | POA: Diagnosis not present

## 2017-01-15 DIAGNOSIS — F0391 Unspecified dementia with behavioral disturbance: Secondary | ICD-10-CM | POA: Diagnosis not present

## 2017-01-15 DIAGNOSIS — I69359 Hemiplegia and hemiparesis following cerebral infarction affecting unspecified side: Secondary | ICD-10-CM | POA: Diagnosis not present

## 2017-01-15 DIAGNOSIS — R296 Repeated falls: Secondary | ICD-10-CM | POA: Diagnosis not present

## 2017-01-16 DIAGNOSIS — R296 Repeated falls: Secondary | ICD-10-CM | POA: Diagnosis not present

## 2017-01-19 DIAGNOSIS — R296 Repeated falls: Secondary | ICD-10-CM | POA: Diagnosis not present

## 2017-01-20 DIAGNOSIS — R296 Repeated falls: Secondary | ICD-10-CM | POA: Diagnosis not present

## 2017-01-21 DIAGNOSIS — R296 Repeated falls: Secondary | ICD-10-CM | POA: Diagnosis not present

## 2017-01-22 DIAGNOSIS — R296 Repeated falls: Secondary | ICD-10-CM | POA: Diagnosis not present

## 2017-01-23 DIAGNOSIS — R296 Repeated falls: Secondary | ICD-10-CM | POA: Diagnosis not present

## 2017-01-26 DIAGNOSIS — R296 Repeated falls: Secondary | ICD-10-CM | POA: Diagnosis not present

## 2017-01-27 DIAGNOSIS — R296 Repeated falls: Secondary | ICD-10-CM | POA: Diagnosis not present

## 2017-01-28 DIAGNOSIS — R296 Repeated falls: Secondary | ICD-10-CM | POA: Diagnosis not present

## 2017-01-29 DIAGNOSIS — R296 Repeated falls: Secondary | ICD-10-CM | POA: Diagnosis not present

## 2017-01-30 DIAGNOSIS — R296 Repeated falls: Secondary | ICD-10-CM | POA: Diagnosis not present

## 2017-02-02 DIAGNOSIS — R296 Repeated falls: Secondary | ICD-10-CM | POA: Diagnosis not present

## 2017-02-03 DIAGNOSIS — R296 Repeated falls: Secondary | ICD-10-CM | POA: Diagnosis not present

## 2017-02-04 DIAGNOSIS — R296 Repeated falls: Secondary | ICD-10-CM | POA: Diagnosis not present

## 2017-02-05 DIAGNOSIS — R296 Repeated falls: Secondary | ICD-10-CM | POA: Diagnosis not present

## 2017-02-07 DIAGNOSIS — M24541 Contracture, right hand: Secondary | ICD-10-CM | POA: Diagnosis not present

## 2017-02-07 DIAGNOSIS — R278 Other lack of coordination: Secondary | ICD-10-CM | POA: Diagnosis not present

## 2017-02-07 DIAGNOSIS — R293 Abnormal posture: Secondary | ICD-10-CM | POA: Diagnosis not present

## 2017-02-07 DIAGNOSIS — R296 Repeated falls: Secondary | ICD-10-CM | POA: Diagnosis not present

## 2017-02-09 DIAGNOSIS — R296 Repeated falls: Secondary | ICD-10-CM | POA: Diagnosis not present

## 2017-02-09 DIAGNOSIS — R293 Abnormal posture: Secondary | ICD-10-CM | POA: Diagnosis not present

## 2017-02-09 DIAGNOSIS — M24541 Contracture, right hand: Secondary | ICD-10-CM | POA: Diagnosis not present

## 2017-02-09 DIAGNOSIS — R278 Other lack of coordination: Secondary | ICD-10-CM | POA: Diagnosis not present

## 2017-02-10 DIAGNOSIS — R296 Repeated falls: Secondary | ICD-10-CM | POA: Diagnosis not present

## 2017-02-10 DIAGNOSIS — M24541 Contracture, right hand: Secondary | ICD-10-CM | POA: Diagnosis not present

## 2017-02-10 DIAGNOSIS — R293 Abnormal posture: Secondary | ICD-10-CM | POA: Diagnosis not present

## 2017-02-10 DIAGNOSIS — R278 Other lack of coordination: Secondary | ICD-10-CM | POA: Diagnosis not present

## 2017-02-11 DIAGNOSIS — M24541 Contracture, right hand: Secondary | ICD-10-CM | POA: Diagnosis not present

## 2017-02-11 DIAGNOSIS — R296 Repeated falls: Secondary | ICD-10-CM | POA: Diagnosis not present

## 2017-02-11 DIAGNOSIS — R293 Abnormal posture: Secondary | ICD-10-CM | POA: Diagnosis not present

## 2017-02-11 DIAGNOSIS — R278 Other lack of coordination: Secondary | ICD-10-CM | POA: Diagnosis not present

## 2017-02-14 DIAGNOSIS — R278 Other lack of coordination: Secondary | ICD-10-CM | POA: Diagnosis not present

## 2017-02-14 DIAGNOSIS — R296 Repeated falls: Secondary | ICD-10-CM | POA: Diagnosis not present

## 2017-02-14 DIAGNOSIS — R293 Abnormal posture: Secondary | ICD-10-CM | POA: Diagnosis not present

## 2017-02-14 DIAGNOSIS — M24541 Contracture, right hand: Secondary | ICD-10-CM | POA: Diagnosis not present

## 2017-02-17 DIAGNOSIS — M24541 Contracture, right hand: Secondary | ICD-10-CM | POA: Diagnosis not present

## 2017-02-17 DIAGNOSIS — R296 Repeated falls: Secondary | ICD-10-CM | POA: Diagnosis not present

## 2017-02-17 DIAGNOSIS — R278 Other lack of coordination: Secondary | ICD-10-CM | POA: Diagnosis not present

## 2017-02-17 DIAGNOSIS — R293 Abnormal posture: Secondary | ICD-10-CM | POA: Diagnosis not present

## 2017-02-18 DIAGNOSIS — R293 Abnormal posture: Secondary | ICD-10-CM | POA: Diagnosis not present

## 2017-02-18 DIAGNOSIS — R278 Other lack of coordination: Secondary | ICD-10-CM | POA: Diagnosis not present

## 2017-02-18 DIAGNOSIS — R296 Repeated falls: Secondary | ICD-10-CM | POA: Diagnosis not present

## 2017-02-18 DIAGNOSIS — M24541 Contracture, right hand: Secondary | ICD-10-CM | POA: Diagnosis not present

## 2017-02-19 DIAGNOSIS — R569 Unspecified convulsions: Secondary | ICD-10-CM | POA: Diagnosis not present

## 2017-02-19 DIAGNOSIS — I10 Essential (primary) hypertension: Secondary | ICD-10-CM | POA: Diagnosis not present

## 2017-02-19 DIAGNOSIS — F0390 Unspecified dementia without behavioral disturbance: Secondary | ICD-10-CM | POA: Diagnosis not present

## 2017-02-19 DIAGNOSIS — F322 Major depressive disorder, single episode, severe without psychotic features: Secondary | ICD-10-CM | POA: Diagnosis not present

## 2017-02-19 DIAGNOSIS — F411 Generalized anxiety disorder: Secondary | ICD-10-CM | POA: Diagnosis not present

## 2017-02-19 DIAGNOSIS — I639 Cerebral infarction, unspecified: Secondary | ICD-10-CM | POA: Diagnosis not present

## 2017-02-19 DIAGNOSIS — I82401 Acute embolism and thrombosis of unspecified deep veins of right lower extremity: Secondary | ICD-10-CM | POA: Diagnosis not present

## 2017-02-20 DIAGNOSIS — M24541 Contracture, right hand: Secondary | ICD-10-CM | POA: Diagnosis not present

## 2017-02-20 DIAGNOSIS — R293 Abnormal posture: Secondary | ICD-10-CM | POA: Diagnosis not present

## 2017-02-20 DIAGNOSIS — R296 Repeated falls: Secondary | ICD-10-CM | POA: Diagnosis not present

## 2017-02-20 DIAGNOSIS — R278 Other lack of coordination: Secondary | ICD-10-CM | POA: Diagnosis not present

## 2017-02-23 DIAGNOSIS — R278 Other lack of coordination: Secondary | ICD-10-CM | POA: Diagnosis not present

## 2017-02-23 DIAGNOSIS — M24541 Contracture, right hand: Secondary | ICD-10-CM | POA: Diagnosis not present

## 2017-02-23 DIAGNOSIS — R296 Repeated falls: Secondary | ICD-10-CM | POA: Diagnosis not present

## 2017-02-23 DIAGNOSIS — R293 Abnormal posture: Secondary | ICD-10-CM | POA: Diagnosis not present

## 2017-02-26 DIAGNOSIS — R296 Repeated falls: Secondary | ICD-10-CM | POA: Diagnosis not present

## 2017-02-26 DIAGNOSIS — M24541 Contracture, right hand: Secondary | ICD-10-CM | POA: Diagnosis not present

## 2017-02-26 DIAGNOSIS — R278 Other lack of coordination: Secondary | ICD-10-CM | POA: Diagnosis not present

## 2017-02-26 DIAGNOSIS — R293 Abnormal posture: Secondary | ICD-10-CM | POA: Diagnosis not present

## 2017-02-27 DIAGNOSIS — M24541 Contracture, right hand: Secondary | ICD-10-CM | POA: Diagnosis not present

## 2017-02-27 DIAGNOSIS — R296 Repeated falls: Secondary | ICD-10-CM | POA: Diagnosis not present

## 2017-02-27 DIAGNOSIS — R278 Other lack of coordination: Secondary | ICD-10-CM | POA: Diagnosis not present

## 2017-02-27 DIAGNOSIS — R293 Abnormal posture: Secondary | ICD-10-CM | POA: Diagnosis not present

## 2017-03-04 DIAGNOSIS — M24541 Contracture, right hand: Secondary | ICD-10-CM | POA: Diagnosis not present

## 2017-03-04 DIAGNOSIS — R278 Other lack of coordination: Secondary | ICD-10-CM | POA: Diagnosis not present

## 2017-03-04 DIAGNOSIS — R293 Abnormal posture: Secondary | ICD-10-CM | POA: Diagnosis not present

## 2017-03-04 DIAGNOSIS — R296 Repeated falls: Secondary | ICD-10-CM | POA: Diagnosis not present

## 2017-03-05 DIAGNOSIS — R278 Other lack of coordination: Secondary | ICD-10-CM | POA: Diagnosis not present

## 2017-03-05 DIAGNOSIS — R296 Repeated falls: Secondary | ICD-10-CM | POA: Diagnosis not present

## 2017-03-05 DIAGNOSIS — M24541 Contracture, right hand: Secondary | ICD-10-CM | POA: Diagnosis not present

## 2017-03-05 DIAGNOSIS — R293 Abnormal posture: Secondary | ICD-10-CM | POA: Diagnosis not present

## 2017-03-06 DIAGNOSIS — R278 Other lack of coordination: Secondary | ICD-10-CM | POA: Diagnosis not present

## 2017-03-06 DIAGNOSIS — R293 Abnormal posture: Secondary | ICD-10-CM | POA: Diagnosis not present

## 2017-03-06 DIAGNOSIS — M24541 Contracture, right hand: Secondary | ICD-10-CM | POA: Diagnosis not present

## 2017-03-06 DIAGNOSIS — R296 Repeated falls: Secondary | ICD-10-CM | POA: Diagnosis not present

## 2017-03-09 DIAGNOSIS — M24541 Contracture, right hand: Secondary | ICD-10-CM | POA: Diagnosis not present

## 2017-03-09 DIAGNOSIS — R296 Repeated falls: Secondary | ICD-10-CM | POA: Diagnosis not present

## 2017-03-09 DIAGNOSIS — R293 Abnormal posture: Secondary | ICD-10-CM | POA: Diagnosis not present

## 2017-03-09 DIAGNOSIS — R278 Other lack of coordination: Secondary | ICD-10-CM | POA: Diagnosis not present

## 2017-03-10 DIAGNOSIS — R293 Abnormal posture: Secondary | ICD-10-CM | POA: Diagnosis not present

## 2017-03-10 DIAGNOSIS — R278 Other lack of coordination: Secondary | ICD-10-CM | POA: Diagnosis not present

## 2017-03-10 DIAGNOSIS — M24541 Contracture, right hand: Secondary | ICD-10-CM | POA: Diagnosis not present

## 2017-03-10 DIAGNOSIS — R296 Repeated falls: Secondary | ICD-10-CM | POA: Diagnosis not present

## 2017-03-11 DIAGNOSIS — R278 Other lack of coordination: Secondary | ICD-10-CM | POA: Diagnosis not present

## 2017-03-11 DIAGNOSIS — M24541 Contracture, right hand: Secondary | ICD-10-CM | POA: Diagnosis not present

## 2017-03-11 DIAGNOSIS — R293 Abnormal posture: Secondary | ICD-10-CM | POA: Diagnosis not present

## 2017-03-11 DIAGNOSIS — R296 Repeated falls: Secondary | ICD-10-CM | POA: Diagnosis not present

## 2017-03-12 DIAGNOSIS — R296 Repeated falls: Secondary | ICD-10-CM | POA: Diagnosis not present

## 2017-03-12 DIAGNOSIS — R278 Other lack of coordination: Secondary | ICD-10-CM | POA: Diagnosis not present

## 2017-03-12 DIAGNOSIS — R293 Abnormal posture: Secondary | ICD-10-CM | POA: Diagnosis not present

## 2017-03-12 DIAGNOSIS — M24541 Contracture, right hand: Secondary | ICD-10-CM | POA: Diagnosis not present

## 2017-03-13 DIAGNOSIS — R278 Other lack of coordination: Secondary | ICD-10-CM | POA: Diagnosis not present

## 2017-03-13 DIAGNOSIS — R296 Repeated falls: Secondary | ICD-10-CM | POA: Diagnosis not present

## 2017-03-13 DIAGNOSIS — M24541 Contracture, right hand: Secondary | ICD-10-CM | POA: Diagnosis not present

## 2017-03-13 DIAGNOSIS — R293 Abnormal posture: Secondary | ICD-10-CM | POA: Diagnosis not present

## 2017-03-16 DIAGNOSIS — M24541 Contracture, right hand: Secondary | ICD-10-CM | POA: Diagnosis not present

## 2017-03-16 DIAGNOSIS — R278 Other lack of coordination: Secondary | ICD-10-CM | POA: Diagnosis not present

## 2017-03-16 DIAGNOSIS — R296 Repeated falls: Secondary | ICD-10-CM | POA: Diagnosis not present

## 2017-03-16 DIAGNOSIS — R293 Abnormal posture: Secondary | ICD-10-CM | POA: Diagnosis not present

## 2017-03-17 DIAGNOSIS — R296 Repeated falls: Secondary | ICD-10-CM | POA: Diagnosis not present

## 2017-03-17 DIAGNOSIS — R293 Abnormal posture: Secondary | ICD-10-CM | POA: Diagnosis not present

## 2017-03-17 DIAGNOSIS — M24541 Contracture, right hand: Secondary | ICD-10-CM | POA: Diagnosis not present

## 2017-03-17 DIAGNOSIS — R278 Other lack of coordination: Secondary | ICD-10-CM | POA: Diagnosis not present

## 2017-03-18 DIAGNOSIS — R278 Other lack of coordination: Secondary | ICD-10-CM | POA: Diagnosis not present

## 2017-03-18 DIAGNOSIS — R293 Abnormal posture: Secondary | ICD-10-CM | POA: Diagnosis not present

## 2017-03-18 DIAGNOSIS — R296 Repeated falls: Secondary | ICD-10-CM | POA: Diagnosis not present

## 2017-03-18 DIAGNOSIS — M24541 Contracture, right hand: Secondary | ICD-10-CM | POA: Diagnosis not present

## 2017-03-19 DIAGNOSIS — I69359 Hemiplegia and hemiparesis following cerebral infarction affecting unspecified side: Secondary | ICD-10-CM | POA: Diagnosis not present

## 2017-03-19 DIAGNOSIS — R278 Other lack of coordination: Secondary | ICD-10-CM | POA: Diagnosis not present

## 2017-03-19 DIAGNOSIS — R293 Abnormal posture: Secondary | ICD-10-CM | POA: Diagnosis not present

## 2017-03-19 DIAGNOSIS — K429 Umbilical hernia without obstruction or gangrene: Secondary | ICD-10-CM | POA: Diagnosis not present

## 2017-03-19 DIAGNOSIS — M24541 Contracture, right hand: Secondary | ICD-10-CM | POA: Diagnosis not present

## 2017-03-19 DIAGNOSIS — I10 Essential (primary) hypertension: Secondary | ICD-10-CM | POA: Diagnosis not present

## 2017-03-19 DIAGNOSIS — I82401 Acute embolism and thrombosis of unspecified deep veins of right lower extremity: Secondary | ICD-10-CM | POA: Diagnosis not present

## 2017-03-19 DIAGNOSIS — R296 Repeated falls: Secondary | ICD-10-CM | POA: Diagnosis not present

## 2017-03-19 DIAGNOSIS — F0391 Unspecified dementia with behavioral disturbance: Secondary | ICD-10-CM | POA: Diagnosis not present

## 2017-03-20 DIAGNOSIS — R278 Other lack of coordination: Secondary | ICD-10-CM | POA: Diagnosis not present

## 2017-03-20 DIAGNOSIS — R296 Repeated falls: Secondary | ICD-10-CM | POA: Diagnosis not present

## 2017-03-20 DIAGNOSIS — R293 Abnormal posture: Secondary | ICD-10-CM | POA: Diagnosis not present

## 2017-03-20 DIAGNOSIS — M24541 Contracture, right hand: Secondary | ICD-10-CM | POA: Diagnosis not present

## 2017-03-23 DIAGNOSIS — R296 Repeated falls: Secondary | ICD-10-CM | POA: Diagnosis not present

## 2017-03-23 DIAGNOSIS — I739 Peripheral vascular disease, unspecified: Secondary | ICD-10-CM | POA: Diagnosis not present

## 2017-03-23 DIAGNOSIS — B351 Tinea unguium: Secondary | ICD-10-CM | POA: Diagnosis not present

## 2017-03-23 DIAGNOSIS — R600 Localized edema: Secondary | ICD-10-CM | POA: Diagnosis not present

## 2017-03-23 DIAGNOSIS — M24541 Contracture, right hand: Secondary | ICD-10-CM | POA: Diagnosis not present

## 2017-03-23 DIAGNOSIS — R278 Other lack of coordination: Secondary | ICD-10-CM | POA: Diagnosis not present

## 2017-03-23 DIAGNOSIS — R293 Abnormal posture: Secondary | ICD-10-CM | POA: Diagnosis not present

## 2017-03-25 DIAGNOSIS — R278 Other lack of coordination: Secondary | ICD-10-CM | POA: Diagnosis not present

## 2017-03-25 DIAGNOSIS — R293 Abnormal posture: Secondary | ICD-10-CM | POA: Diagnosis not present

## 2017-03-25 DIAGNOSIS — M24541 Contracture, right hand: Secondary | ICD-10-CM | POA: Diagnosis not present

## 2017-03-25 DIAGNOSIS — R296 Repeated falls: Secondary | ICD-10-CM | POA: Diagnosis not present

## 2017-03-26 DIAGNOSIS — M24541 Contracture, right hand: Secondary | ICD-10-CM | POA: Diagnosis not present

## 2017-03-26 DIAGNOSIS — R278 Other lack of coordination: Secondary | ICD-10-CM | POA: Diagnosis not present

## 2017-03-26 DIAGNOSIS — R293 Abnormal posture: Secondary | ICD-10-CM | POA: Diagnosis not present

## 2017-03-26 DIAGNOSIS — R296 Repeated falls: Secondary | ICD-10-CM | POA: Diagnosis not present

## 2017-03-27 DIAGNOSIS — R296 Repeated falls: Secondary | ICD-10-CM | POA: Diagnosis not present

## 2017-03-27 DIAGNOSIS — R293 Abnormal posture: Secondary | ICD-10-CM | POA: Diagnosis not present

## 2017-03-27 DIAGNOSIS — R278 Other lack of coordination: Secondary | ICD-10-CM | POA: Diagnosis not present

## 2017-03-27 DIAGNOSIS — M24541 Contracture, right hand: Secondary | ICD-10-CM | POA: Diagnosis not present

## 2017-03-28 DIAGNOSIS — R293 Abnormal posture: Secondary | ICD-10-CM | POA: Diagnosis not present

## 2017-03-28 DIAGNOSIS — R278 Other lack of coordination: Secondary | ICD-10-CM | POA: Diagnosis not present

## 2017-03-28 DIAGNOSIS — M24541 Contracture, right hand: Secondary | ICD-10-CM | POA: Diagnosis not present

## 2017-03-28 DIAGNOSIS — R296 Repeated falls: Secondary | ICD-10-CM | POA: Diagnosis not present

## 2017-03-30 DIAGNOSIS — R293 Abnormal posture: Secondary | ICD-10-CM | POA: Diagnosis not present

## 2017-03-30 DIAGNOSIS — R278 Other lack of coordination: Secondary | ICD-10-CM | POA: Diagnosis not present

## 2017-03-30 DIAGNOSIS — M24541 Contracture, right hand: Secondary | ICD-10-CM | POA: Diagnosis not present

## 2017-03-30 DIAGNOSIS — R296 Repeated falls: Secondary | ICD-10-CM | POA: Diagnosis not present

## 2017-03-31 DIAGNOSIS — R278 Other lack of coordination: Secondary | ICD-10-CM | POA: Diagnosis not present

## 2017-03-31 DIAGNOSIS — R293 Abnormal posture: Secondary | ICD-10-CM | POA: Diagnosis not present

## 2017-03-31 DIAGNOSIS — M24541 Contracture, right hand: Secondary | ICD-10-CM | POA: Diagnosis not present

## 2017-03-31 DIAGNOSIS — R296 Repeated falls: Secondary | ICD-10-CM | POA: Diagnosis not present

## 2017-04-09 DIAGNOSIS — R569 Unspecified convulsions: Secondary | ICD-10-CM | POA: Diagnosis not present

## 2017-04-09 DIAGNOSIS — N289 Disorder of kidney and ureter, unspecified: Secondary | ICD-10-CM | POA: Diagnosis not present

## 2017-04-09 DIAGNOSIS — G4700 Insomnia, unspecified: Secondary | ICD-10-CM | POA: Diagnosis not present

## 2017-04-09 DIAGNOSIS — Z79899 Other long term (current) drug therapy: Secondary | ICD-10-CM | POA: Diagnosis not present

## 2017-04-09 DIAGNOSIS — D649 Anemia, unspecified: Secondary | ICD-10-CM | POA: Diagnosis not present

## 2017-04-09 DIAGNOSIS — F3341 Major depressive disorder, recurrent, in partial remission: Secondary | ICD-10-CM | POA: Diagnosis not present

## 2017-04-09 DIAGNOSIS — F4329 Adjustment disorder with other symptoms: Secondary | ICD-10-CM | POA: Diagnosis not present

## 2017-04-14 DIAGNOSIS — Z7901 Long term (current) use of anticoagulants: Secondary | ICD-10-CM | POA: Diagnosis not present

## 2017-04-16 DIAGNOSIS — I10 Essential (primary) hypertension: Secondary | ICD-10-CM | POA: Diagnosis not present

## 2017-04-16 DIAGNOSIS — R569 Unspecified convulsions: Secondary | ICD-10-CM | POA: Diagnosis not present

## 2017-04-16 DIAGNOSIS — D649 Anemia, unspecified: Secondary | ICD-10-CM | POA: Diagnosis not present

## 2017-04-16 DIAGNOSIS — F322 Major depressive disorder, single episode, severe without psychotic features: Secondary | ICD-10-CM | POA: Diagnosis not present

## 2017-04-16 DIAGNOSIS — E785 Hyperlipidemia, unspecified: Secondary | ICD-10-CM | POA: Diagnosis not present

## 2017-04-16 DIAGNOSIS — I82401 Acute embolism and thrombosis of unspecified deep veins of right lower extremity: Secondary | ICD-10-CM | POA: Diagnosis not present

## 2017-04-16 DIAGNOSIS — F0390 Unspecified dementia without behavioral disturbance: Secondary | ICD-10-CM | POA: Diagnosis not present

## 2017-04-16 DIAGNOSIS — I739 Peripheral vascular disease, unspecified: Secondary | ICD-10-CM | POA: Diagnosis not present

## 2017-04-16 DIAGNOSIS — K5900 Constipation, unspecified: Secondary | ICD-10-CM | POA: Diagnosis not present

## 2017-04-16 DIAGNOSIS — I69359 Hemiplegia and hemiparesis following cerebral infarction affecting unspecified side: Secondary | ICD-10-CM | POA: Diagnosis not present

## 2017-04-16 DIAGNOSIS — Z79899 Other long term (current) drug therapy: Secondary | ICD-10-CM | POA: Diagnosis not present

## 2017-05-08 DIAGNOSIS — Z23 Encounter for immunization: Secondary | ICD-10-CM | POA: Diagnosis not present

## 2017-05-12 DIAGNOSIS — D688 Other specified coagulation defects: Secondary | ICD-10-CM | POA: Diagnosis not present

## 2017-05-21 DIAGNOSIS — F322 Major depressive disorder, single episode, severe without psychotic features: Secondary | ICD-10-CM | POA: Diagnosis not present

## 2017-05-21 DIAGNOSIS — R569 Unspecified convulsions: Secondary | ICD-10-CM | POA: Diagnosis not present

## 2017-05-21 DIAGNOSIS — R1310 Dysphagia, unspecified: Secondary | ICD-10-CM | POA: Diagnosis not present

## 2017-05-21 DIAGNOSIS — I10 Essential (primary) hypertension: Secondary | ICD-10-CM | POA: Diagnosis not present

## 2017-05-21 DIAGNOSIS — I69359 Hemiplegia and hemiparesis following cerebral infarction affecting unspecified side: Secondary | ICD-10-CM | POA: Diagnosis not present

## 2017-05-21 DIAGNOSIS — F0391 Unspecified dementia with behavioral disturbance: Secondary | ICD-10-CM | POA: Diagnosis not present

## 2017-05-21 DIAGNOSIS — I82401 Acute embolism and thrombosis of unspecified deep veins of right lower extremity: Secondary | ICD-10-CM | POA: Diagnosis not present

## 2017-05-26 DIAGNOSIS — Z7409 Other reduced mobility: Secondary | ICD-10-CM | POA: Diagnosis not present

## 2017-05-26 DIAGNOSIS — G8101 Flaccid hemiplegia affecting right dominant side: Secondary | ICD-10-CM | POA: Diagnosis not present

## 2017-05-26 DIAGNOSIS — M6281 Muscle weakness (generalized): Secondary | ICD-10-CM | POA: Diagnosis not present

## 2017-05-27 DIAGNOSIS — M6281 Muscle weakness (generalized): Secondary | ICD-10-CM | POA: Diagnosis not present

## 2017-05-27 DIAGNOSIS — G8101 Flaccid hemiplegia affecting right dominant side: Secondary | ICD-10-CM | POA: Diagnosis not present

## 2017-05-27 DIAGNOSIS — Z7409 Other reduced mobility: Secondary | ICD-10-CM | POA: Diagnosis not present

## 2017-05-28 DIAGNOSIS — G8101 Flaccid hemiplegia affecting right dominant side: Secondary | ICD-10-CM | POA: Diagnosis not present

## 2017-05-28 DIAGNOSIS — M6281 Muscle weakness (generalized): Secondary | ICD-10-CM | POA: Diagnosis not present

## 2017-05-28 DIAGNOSIS — Z7409 Other reduced mobility: Secondary | ICD-10-CM | POA: Diagnosis not present

## 2017-05-29 DIAGNOSIS — G8101 Flaccid hemiplegia affecting right dominant side: Secondary | ICD-10-CM | POA: Diagnosis not present

## 2017-05-29 DIAGNOSIS — Z7409 Other reduced mobility: Secondary | ICD-10-CM | POA: Diagnosis not present

## 2017-05-29 DIAGNOSIS — M6281 Muscle weakness (generalized): Secondary | ICD-10-CM | POA: Diagnosis not present

## 2017-05-31 DIAGNOSIS — Z7409 Other reduced mobility: Secondary | ICD-10-CM | POA: Diagnosis not present

## 2017-05-31 DIAGNOSIS — G8101 Flaccid hemiplegia affecting right dominant side: Secondary | ICD-10-CM | POA: Diagnosis not present

## 2017-05-31 DIAGNOSIS — M6281 Muscle weakness (generalized): Secondary | ICD-10-CM | POA: Diagnosis not present

## 2017-06-01 DIAGNOSIS — G8101 Flaccid hemiplegia affecting right dominant side: Secondary | ICD-10-CM | POA: Diagnosis not present

## 2017-06-01 DIAGNOSIS — M6281 Muscle weakness (generalized): Secondary | ICD-10-CM | POA: Diagnosis not present

## 2017-06-01 DIAGNOSIS — Z7409 Other reduced mobility: Secondary | ICD-10-CM | POA: Diagnosis not present

## 2017-06-03 DIAGNOSIS — Z7409 Other reduced mobility: Secondary | ICD-10-CM | POA: Diagnosis not present

## 2017-06-03 DIAGNOSIS — G8101 Flaccid hemiplegia affecting right dominant side: Secondary | ICD-10-CM | POA: Diagnosis not present

## 2017-06-03 DIAGNOSIS — M6281 Muscle weakness (generalized): Secondary | ICD-10-CM | POA: Diagnosis not present

## 2017-06-04 DIAGNOSIS — Z7409 Other reduced mobility: Secondary | ICD-10-CM | POA: Diagnosis not present

## 2017-06-04 DIAGNOSIS — M6281 Muscle weakness (generalized): Secondary | ICD-10-CM | POA: Diagnosis not present

## 2017-06-04 DIAGNOSIS — G8101 Flaccid hemiplegia affecting right dominant side: Secondary | ICD-10-CM | POA: Diagnosis not present

## 2017-06-05 DIAGNOSIS — M6281 Muscle weakness (generalized): Secondary | ICD-10-CM | POA: Diagnosis not present

## 2017-06-05 DIAGNOSIS — Z7409 Other reduced mobility: Secondary | ICD-10-CM | POA: Diagnosis not present

## 2017-06-05 DIAGNOSIS — G8101 Flaccid hemiplegia affecting right dominant side: Secondary | ICD-10-CM | POA: Diagnosis not present

## 2017-06-08 DIAGNOSIS — Z7409 Other reduced mobility: Secondary | ICD-10-CM | POA: Diagnosis not present

## 2017-06-08 DIAGNOSIS — M6281 Muscle weakness (generalized): Secondary | ICD-10-CM | POA: Diagnosis not present

## 2017-06-08 DIAGNOSIS — G8101 Flaccid hemiplegia affecting right dominant side: Secondary | ICD-10-CM | POA: Diagnosis not present

## 2017-06-09 DIAGNOSIS — G8101 Flaccid hemiplegia affecting right dominant side: Secondary | ICD-10-CM | POA: Diagnosis not present

## 2017-06-09 DIAGNOSIS — M6281 Muscle weakness (generalized): Secondary | ICD-10-CM | POA: Diagnosis not present

## 2017-06-09 DIAGNOSIS — Z7409 Other reduced mobility: Secondary | ICD-10-CM | POA: Diagnosis not present

## 2017-06-10 DIAGNOSIS — Z7409 Other reduced mobility: Secondary | ICD-10-CM | POA: Diagnosis not present

## 2017-06-10 DIAGNOSIS — G8101 Flaccid hemiplegia affecting right dominant side: Secondary | ICD-10-CM | POA: Diagnosis not present

## 2017-06-10 DIAGNOSIS — M6281 Muscle weakness (generalized): Secondary | ICD-10-CM | POA: Diagnosis not present

## 2017-06-11 DIAGNOSIS — Z7409 Other reduced mobility: Secondary | ICD-10-CM | POA: Diagnosis not present

## 2017-06-11 DIAGNOSIS — G8101 Flaccid hemiplegia affecting right dominant side: Secondary | ICD-10-CM | POA: Diagnosis not present

## 2017-06-11 DIAGNOSIS — M6281 Muscle weakness (generalized): Secondary | ICD-10-CM | POA: Diagnosis not present

## 2017-06-12 DIAGNOSIS — M6281 Muscle weakness (generalized): Secondary | ICD-10-CM | POA: Diagnosis not present

## 2017-06-12 DIAGNOSIS — Z7409 Other reduced mobility: Secondary | ICD-10-CM | POA: Diagnosis not present

## 2017-06-12 DIAGNOSIS — G8101 Flaccid hemiplegia affecting right dominant side: Secondary | ICD-10-CM | POA: Diagnosis not present

## 2017-06-15 DIAGNOSIS — M6281 Muscle weakness (generalized): Secondary | ICD-10-CM | POA: Diagnosis not present

## 2017-06-15 DIAGNOSIS — Z7409 Other reduced mobility: Secondary | ICD-10-CM | POA: Diagnosis not present

## 2017-06-15 DIAGNOSIS — G8101 Flaccid hemiplegia affecting right dominant side: Secondary | ICD-10-CM | POA: Diagnosis not present

## 2017-06-16 DIAGNOSIS — M6281 Muscle weakness (generalized): Secondary | ICD-10-CM | POA: Diagnosis not present

## 2017-06-16 DIAGNOSIS — G8101 Flaccid hemiplegia affecting right dominant side: Secondary | ICD-10-CM | POA: Diagnosis not present

## 2017-06-16 DIAGNOSIS — Z7409 Other reduced mobility: Secondary | ICD-10-CM | POA: Diagnosis not present

## 2017-06-17 DIAGNOSIS — L603 Nail dystrophy: Secondary | ICD-10-CM | POA: Diagnosis not present

## 2017-06-17 DIAGNOSIS — Z7409 Other reduced mobility: Secondary | ICD-10-CM | POA: Diagnosis not present

## 2017-06-17 DIAGNOSIS — I739 Peripheral vascular disease, unspecified: Secondary | ICD-10-CM | POA: Diagnosis not present

## 2017-06-17 DIAGNOSIS — R262 Difficulty in walking, not elsewhere classified: Secondary | ICD-10-CM | POA: Diagnosis not present

## 2017-06-17 DIAGNOSIS — G8101 Flaccid hemiplegia affecting right dominant side: Secondary | ICD-10-CM | POA: Diagnosis not present

## 2017-06-17 DIAGNOSIS — M6281 Muscle weakness (generalized): Secondary | ICD-10-CM | POA: Diagnosis not present

## 2017-06-18 DIAGNOSIS — F0390 Unspecified dementia without behavioral disturbance: Secondary | ICD-10-CM | POA: Diagnosis not present

## 2017-06-18 DIAGNOSIS — M6281 Muscle weakness (generalized): Secondary | ICD-10-CM | POA: Diagnosis not present

## 2017-06-18 DIAGNOSIS — G40909 Epilepsy, unspecified, not intractable, without status epilepticus: Secondary | ICD-10-CM | POA: Diagnosis not present

## 2017-06-18 DIAGNOSIS — G8101 Flaccid hemiplegia affecting right dominant side: Secondary | ICD-10-CM | POA: Diagnosis not present

## 2017-06-18 DIAGNOSIS — F322 Major depressive disorder, single episode, severe without psychotic features: Secondary | ICD-10-CM | POA: Diagnosis not present

## 2017-06-18 DIAGNOSIS — I69359 Hemiplegia and hemiparesis following cerebral infarction affecting unspecified side: Secondary | ICD-10-CM | POA: Diagnosis not present

## 2017-06-18 DIAGNOSIS — Z7409 Other reduced mobility: Secondary | ICD-10-CM | POA: Diagnosis not present

## 2017-06-18 DIAGNOSIS — I10 Essential (primary) hypertension: Secondary | ICD-10-CM | POA: Diagnosis not present

## 2017-06-18 DIAGNOSIS — I890 Lymphedema, not elsewhere classified: Secondary | ICD-10-CM | POA: Diagnosis not present

## 2017-06-18 DIAGNOSIS — I82401 Acute embolism and thrombosis of unspecified deep veins of right lower extremity: Secondary | ICD-10-CM | POA: Diagnosis not present

## 2017-06-19 DIAGNOSIS — G8101 Flaccid hemiplegia affecting right dominant side: Secondary | ICD-10-CM | POA: Diagnosis not present

## 2017-06-19 DIAGNOSIS — M6281 Muscle weakness (generalized): Secondary | ICD-10-CM | POA: Diagnosis not present

## 2017-06-19 DIAGNOSIS — Z7409 Other reduced mobility: Secondary | ICD-10-CM | POA: Diagnosis not present

## 2017-06-23 DIAGNOSIS — M6281 Muscle weakness (generalized): Secondary | ICD-10-CM | POA: Diagnosis not present

## 2017-06-23 DIAGNOSIS — G8101 Flaccid hemiplegia affecting right dominant side: Secondary | ICD-10-CM | POA: Diagnosis not present

## 2017-06-23 DIAGNOSIS — Z7409 Other reduced mobility: Secondary | ICD-10-CM | POA: Diagnosis not present

## 2017-06-24 DIAGNOSIS — M6281 Muscle weakness (generalized): Secondary | ICD-10-CM | POA: Diagnosis not present

## 2017-06-24 DIAGNOSIS — G8101 Flaccid hemiplegia affecting right dominant side: Secondary | ICD-10-CM | POA: Diagnosis not present

## 2017-06-24 DIAGNOSIS — Z7409 Other reduced mobility: Secondary | ICD-10-CM | POA: Diagnosis not present

## 2017-06-25 DIAGNOSIS — G8101 Flaccid hemiplegia affecting right dominant side: Secondary | ICD-10-CM | POA: Diagnosis not present

## 2017-06-25 DIAGNOSIS — Z7409 Other reduced mobility: Secondary | ICD-10-CM | POA: Diagnosis not present

## 2017-06-25 DIAGNOSIS — D688 Other specified coagulation defects: Secondary | ICD-10-CM | POA: Diagnosis not present

## 2017-06-25 DIAGNOSIS — M6281 Muscle weakness (generalized): Secondary | ICD-10-CM | POA: Diagnosis not present

## 2017-06-26 DIAGNOSIS — G8101 Flaccid hemiplegia affecting right dominant side: Secondary | ICD-10-CM | POA: Diagnosis not present

## 2017-06-26 DIAGNOSIS — Z7409 Other reduced mobility: Secondary | ICD-10-CM | POA: Diagnosis not present

## 2017-06-26 DIAGNOSIS — M6281 Muscle weakness (generalized): Secondary | ICD-10-CM | POA: Diagnosis not present

## 2017-06-27 DIAGNOSIS — G8101 Flaccid hemiplegia affecting right dominant side: Secondary | ICD-10-CM | POA: Diagnosis not present

## 2017-06-27 DIAGNOSIS — M6281 Muscle weakness (generalized): Secondary | ICD-10-CM | POA: Diagnosis not present

## 2017-06-27 DIAGNOSIS — Z7409 Other reduced mobility: Secondary | ICD-10-CM | POA: Diagnosis not present

## 2017-06-29 DIAGNOSIS — Z7409 Other reduced mobility: Secondary | ICD-10-CM | POA: Diagnosis not present

## 2017-06-29 DIAGNOSIS — G8101 Flaccid hemiplegia affecting right dominant side: Secondary | ICD-10-CM | POA: Diagnosis not present

## 2017-06-29 DIAGNOSIS — M6281 Muscle weakness (generalized): Secondary | ICD-10-CM | POA: Diagnosis not present

## 2017-06-30 DIAGNOSIS — M6281 Muscle weakness (generalized): Secondary | ICD-10-CM | POA: Diagnosis not present

## 2017-06-30 DIAGNOSIS — G8101 Flaccid hemiplegia affecting right dominant side: Secondary | ICD-10-CM | POA: Diagnosis not present

## 2017-06-30 DIAGNOSIS — Z7409 Other reduced mobility: Secondary | ICD-10-CM | POA: Diagnosis not present

## 2017-07-01 DIAGNOSIS — D688 Other specified coagulation defects: Secondary | ICD-10-CM | POA: Diagnosis not present

## 2017-07-01 DIAGNOSIS — M6281 Muscle weakness (generalized): Secondary | ICD-10-CM | POA: Diagnosis not present

## 2017-07-01 DIAGNOSIS — G8101 Flaccid hemiplegia affecting right dominant side: Secondary | ICD-10-CM | POA: Diagnosis not present

## 2017-07-01 DIAGNOSIS — Z7901 Long term (current) use of anticoagulants: Secondary | ICD-10-CM | POA: Diagnosis not present

## 2017-07-01 DIAGNOSIS — Z7409 Other reduced mobility: Secondary | ICD-10-CM | POA: Diagnosis not present

## 2017-07-02 DIAGNOSIS — M6281 Muscle weakness (generalized): Secondary | ICD-10-CM | POA: Diagnosis not present

## 2017-07-02 DIAGNOSIS — G8101 Flaccid hemiplegia affecting right dominant side: Secondary | ICD-10-CM | POA: Diagnosis not present

## 2017-07-02 DIAGNOSIS — Z7409 Other reduced mobility: Secondary | ICD-10-CM | POA: Diagnosis not present

## 2017-07-03 DIAGNOSIS — Z7409 Other reduced mobility: Secondary | ICD-10-CM | POA: Diagnosis not present

## 2017-07-03 DIAGNOSIS — G8101 Flaccid hemiplegia affecting right dominant side: Secondary | ICD-10-CM | POA: Diagnosis not present

## 2017-07-03 DIAGNOSIS — M6281 Muscle weakness (generalized): Secondary | ICD-10-CM | POA: Diagnosis not present

## 2017-07-05 DIAGNOSIS — Z7409 Other reduced mobility: Secondary | ICD-10-CM | POA: Diagnosis not present

## 2017-07-05 DIAGNOSIS — G8101 Flaccid hemiplegia affecting right dominant side: Secondary | ICD-10-CM | POA: Diagnosis not present

## 2017-07-05 DIAGNOSIS — M6281 Muscle weakness (generalized): Secondary | ICD-10-CM | POA: Diagnosis not present

## 2017-07-06 DIAGNOSIS — G8101 Flaccid hemiplegia affecting right dominant side: Secondary | ICD-10-CM | POA: Diagnosis not present

## 2017-07-06 DIAGNOSIS — Z7409 Other reduced mobility: Secondary | ICD-10-CM | POA: Diagnosis not present

## 2017-07-06 DIAGNOSIS — M6281 Muscle weakness (generalized): Secondary | ICD-10-CM | POA: Diagnosis not present

## 2017-07-08 DIAGNOSIS — Z7409 Other reduced mobility: Secondary | ICD-10-CM | POA: Diagnosis not present

## 2017-07-08 DIAGNOSIS — G8101 Flaccid hemiplegia affecting right dominant side: Secondary | ICD-10-CM | POA: Diagnosis not present

## 2017-07-08 DIAGNOSIS — M6281 Muscle weakness (generalized): Secondary | ICD-10-CM | POA: Diagnosis not present

## 2017-07-09 DIAGNOSIS — G8101 Flaccid hemiplegia affecting right dominant side: Secondary | ICD-10-CM | POA: Diagnosis not present

## 2017-07-09 DIAGNOSIS — Z7409 Other reduced mobility: Secondary | ICD-10-CM | POA: Diagnosis not present

## 2017-07-09 DIAGNOSIS — M6281 Muscle weakness (generalized): Secondary | ICD-10-CM | POA: Diagnosis not present

## 2017-07-10 DIAGNOSIS — G8101 Flaccid hemiplegia affecting right dominant side: Secondary | ICD-10-CM | POA: Diagnosis not present

## 2017-07-10 DIAGNOSIS — M6281 Muscle weakness (generalized): Secondary | ICD-10-CM | POA: Diagnosis not present

## 2017-07-10 DIAGNOSIS — Z7409 Other reduced mobility: Secondary | ICD-10-CM | POA: Diagnosis not present

## 2017-07-13 DIAGNOSIS — G8101 Flaccid hemiplegia affecting right dominant side: Secondary | ICD-10-CM | POA: Diagnosis not present

## 2017-07-13 DIAGNOSIS — M6281 Muscle weakness (generalized): Secondary | ICD-10-CM | POA: Diagnosis not present

## 2017-07-13 DIAGNOSIS — Z7409 Other reduced mobility: Secondary | ICD-10-CM | POA: Diagnosis not present

## 2017-07-16 DIAGNOSIS — R569 Unspecified convulsions: Secondary | ICD-10-CM | POA: Diagnosis not present

## 2017-07-16 DIAGNOSIS — I10 Essential (primary) hypertension: Secondary | ICD-10-CM | POA: Diagnosis not present

## 2017-07-16 DIAGNOSIS — F0390 Unspecified dementia without behavioral disturbance: Secondary | ICD-10-CM | POA: Diagnosis not present

## 2017-07-16 DIAGNOSIS — I82401 Acute embolism and thrombosis of unspecified deep veins of right lower extremity: Secondary | ICD-10-CM | POA: Diagnosis not present

## 2017-07-16 DIAGNOSIS — R600 Localized edema: Secondary | ICD-10-CM | POA: Diagnosis not present

## 2017-07-16 DIAGNOSIS — I69359 Hemiplegia and hemiparesis following cerebral infarction affecting unspecified side: Secondary | ICD-10-CM | POA: Diagnosis not present

## 2017-08-20 DIAGNOSIS — F0390 Unspecified dementia without behavioral disturbance: Secondary | ICD-10-CM | POA: Diagnosis not present

## 2017-08-20 DIAGNOSIS — I69359 Hemiplegia and hemiparesis following cerebral infarction affecting unspecified side: Secondary | ICD-10-CM | POA: Diagnosis not present

## 2017-08-20 DIAGNOSIS — R569 Unspecified convulsions: Secondary | ICD-10-CM | POA: Diagnosis not present

## 2017-08-20 DIAGNOSIS — F322 Major depressive disorder, single episode, severe without psychotic features: Secondary | ICD-10-CM | POA: Diagnosis not present

## 2017-08-20 DIAGNOSIS — I82401 Acute embolism and thrombosis of unspecified deep veins of right lower extremity: Secondary | ICD-10-CM | POA: Diagnosis not present

## 2017-08-31 DIAGNOSIS — R293 Abnormal posture: Secondary | ICD-10-CM | POA: Diagnosis not present

## 2017-08-31 DIAGNOSIS — G8101 Flaccid hemiplegia affecting right dominant side: Secondary | ICD-10-CM | POA: Diagnosis not present

## 2017-09-01 DIAGNOSIS — R293 Abnormal posture: Secondary | ICD-10-CM | POA: Diagnosis not present

## 2017-09-01 DIAGNOSIS — G8101 Flaccid hemiplegia affecting right dominant side: Secondary | ICD-10-CM | POA: Diagnosis not present

## 2017-09-02 DIAGNOSIS — G8101 Flaccid hemiplegia affecting right dominant side: Secondary | ICD-10-CM | POA: Diagnosis not present

## 2017-09-02 DIAGNOSIS — R293 Abnormal posture: Secondary | ICD-10-CM | POA: Diagnosis not present

## 2017-09-03 DIAGNOSIS — R293 Abnormal posture: Secondary | ICD-10-CM | POA: Diagnosis not present

## 2017-09-03 DIAGNOSIS — E08319 Diabetes mellitus due to underlying condition with unspecified diabetic retinopathy without macular edema: Secondary | ICD-10-CM | POA: Diagnosis not present

## 2017-09-03 DIAGNOSIS — I69952 Hemiplegia and hemiparesis following unspecified cerebrovascular disease affecting left dominant side: Secondary | ICD-10-CM | POA: Diagnosis not present

## 2017-09-03 DIAGNOSIS — K529 Noninfective gastroenteritis and colitis, unspecified: Secondary | ICD-10-CM | POA: Diagnosis not present

## 2017-09-03 DIAGNOSIS — R112 Nausea with vomiting, unspecified: Secondary | ICD-10-CM | POA: Diagnosis not present

## 2017-09-03 DIAGNOSIS — G8101 Flaccid hemiplegia affecting right dominant side: Secondary | ICD-10-CM | POA: Diagnosis not present

## 2017-09-04 DIAGNOSIS — R293 Abnormal posture: Secondary | ICD-10-CM | POA: Diagnosis not present

## 2017-09-04 DIAGNOSIS — G8101 Flaccid hemiplegia affecting right dominant side: Secondary | ICD-10-CM | POA: Diagnosis not present

## 2017-09-07 DIAGNOSIS — G8101 Flaccid hemiplegia affecting right dominant side: Secondary | ICD-10-CM | POA: Diagnosis not present

## 2017-09-07 DIAGNOSIS — R293 Abnormal posture: Secondary | ICD-10-CM | POA: Diagnosis not present

## 2017-09-08 DIAGNOSIS — R293 Abnormal posture: Secondary | ICD-10-CM | POA: Diagnosis not present

## 2017-09-08 DIAGNOSIS — G8101 Flaccid hemiplegia affecting right dominant side: Secondary | ICD-10-CM | POA: Diagnosis not present

## 2017-09-09 DIAGNOSIS — G8101 Flaccid hemiplegia affecting right dominant side: Secondary | ICD-10-CM | POA: Diagnosis not present

## 2017-09-09 DIAGNOSIS — R293 Abnormal posture: Secondary | ICD-10-CM | POA: Diagnosis not present

## 2017-09-10 DIAGNOSIS — G4700 Insomnia, unspecified: Secondary | ICD-10-CM | POA: Diagnosis not present

## 2017-09-10 DIAGNOSIS — G8101 Flaccid hemiplegia affecting right dominant side: Secondary | ICD-10-CM | POA: Diagnosis not present

## 2017-09-10 DIAGNOSIS — F331 Major depressive disorder, recurrent, moderate: Secondary | ICD-10-CM | POA: Diagnosis not present

## 2017-09-10 DIAGNOSIS — R293 Abnormal posture: Secondary | ICD-10-CM | POA: Diagnosis not present

## 2017-09-11 DIAGNOSIS — G8101 Flaccid hemiplegia affecting right dominant side: Secondary | ICD-10-CM | POA: Diagnosis not present

## 2017-09-11 DIAGNOSIS — R293 Abnormal posture: Secondary | ICD-10-CM | POA: Diagnosis not present

## 2017-09-14 DIAGNOSIS — G8101 Flaccid hemiplegia affecting right dominant side: Secondary | ICD-10-CM | POA: Diagnosis not present

## 2017-09-14 DIAGNOSIS — R293 Abnormal posture: Secondary | ICD-10-CM | POA: Diagnosis not present

## 2017-09-15 DIAGNOSIS — R293 Abnormal posture: Secondary | ICD-10-CM | POA: Diagnosis not present

## 2017-09-15 DIAGNOSIS — G8101 Flaccid hemiplegia affecting right dominant side: Secondary | ICD-10-CM | POA: Diagnosis not present

## 2017-09-16 DIAGNOSIS — G8101 Flaccid hemiplegia affecting right dominant side: Secondary | ICD-10-CM | POA: Diagnosis not present

## 2017-09-16 DIAGNOSIS — R293 Abnormal posture: Secondary | ICD-10-CM | POA: Diagnosis not present

## 2017-09-17 DIAGNOSIS — R293 Abnormal posture: Secondary | ICD-10-CM | POA: Diagnosis not present

## 2017-09-17 DIAGNOSIS — I10 Essential (primary) hypertension: Secondary | ICD-10-CM | POA: Diagnosis not present

## 2017-09-17 DIAGNOSIS — F322 Major depressive disorder, single episode, severe without psychotic features: Secondary | ICD-10-CM | POA: Diagnosis not present

## 2017-09-17 DIAGNOSIS — G8101 Flaccid hemiplegia affecting right dominant side: Secondary | ICD-10-CM | POA: Diagnosis not present

## 2017-09-17 DIAGNOSIS — R569 Unspecified convulsions: Secondary | ICD-10-CM | POA: Diagnosis not present

## 2017-09-17 DIAGNOSIS — F0391 Unspecified dementia with behavioral disturbance: Secondary | ICD-10-CM | POA: Diagnosis not present

## 2017-09-17 DIAGNOSIS — I69359 Hemiplegia and hemiparesis following cerebral infarction affecting unspecified side: Secondary | ICD-10-CM | POA: Diagnosis not present

## 2017-09-17 DIAGNOSIS — I82401 Acute embolism and thrombosis of unspecified deep veins of right lower extremity: Secondary | ICD-10-CM | POA: Diagnosis not present

## 2017-09-18 DIAGNOSIS — R293 Abnormal posture: Secondary | ICD-10-CM | POA: Diagnosis not present

## 2017-09-18 DIAGNOSIS — G8101 Flaccid hemiplegia affecting right dominant side: Secondary | ICD-10-CM | POA: Diagnosis not present

## 2017-09-21 DIAGNOSIS — R293 Abnormal posture: Secondary | ICD-10-CM | POA: Diagnosis not present

## 2017-09-21 DIAGNOSIS — G8101 Flaccid hemiplegia affecting right dominant side: Secondary | ICD-10-CM | POA: Diagnosis not present

## 2017-09-22 DIAGNOSIS — L729 Follicular cyst of the skin and subcutaneous tissue, unspecified: Secondary | ICD-10-CM | POA: Diagnosis not present

## 2017-09-22 DIAGNOSIS — G8101 Flaccid hemiplegia affecting right dominant side: Secondary | ICD-10-CM | POA: Diagnosis not present

## 2017-09-22 DIAGNOSIS — R293 Abnormal posture: Secondary | ICD-10-CM | POA: Diagnosis not present

## 2017-09-23 DIAGNOSIS — R293 Abnormal posture: Secondary | ICD-10-CM | POA: Diagnosis not present

## 2017-09-23 DIAGNOSIS — G8101 Flaccid hemiplegia affecting right dominant side: Secondary | ICD-10-CM | POA: Diagnosis not present

## 2017-09-24 DIAGNOSIS — R293 Abnormal posture: Secondary | ICD-10-CM | POA: Diagnosis not present

## 2017-09-24 DIAGNOSIS — G8101 Flaccid hemiplegia affecting right dominant side: Secondary | ICD-10-CM | POA: Diagnosis not present

## 2017-09-25 DIAGNOSIS — G8101 Flaccid hemiplegia affecting right dominant side: Secondary | ICD-10-CM | POA: Diagnosis not present

## 2017-09-25 DIAGNOSIS — R293 Abnormal posture: Secondary | ICD-10-CM | POA: Diagnosis not present

## 2017-09-28 DIAGNOSIS — G8101 Flaccid hemiplegia affecting right dominant side: Secondary | ICD-10-CM | POA: Diagnosis not present

## 2017-09-28 DIAGNOSIS — R293 Abnormal posture: Secondary | ICD-10-CM | POA: Diagnosis not present

## 2017-09-29 DIAGNOSIS — G8101 Flaccid hemiplegia affecting right dominant side: Secondary | ICD-10-CM | POA: Diagnosis not present

## 2017-09-29 DIAGNOSIS — R293 Abnormal posture: Secondary | ICD-10-CM | POA: Diagnosis not present

## 2017-09-30 DIAGNOSIS — R293 Abnormal posture: Secondary | ICD-10-CM | POA: Diagnosis not present

## 2017-09-30 DIAGNOSIS — G8101 Flaccid hemiplegia affecting right dominant side: Secondary | ICD-10-CM | POA: Diagnosis not present

## 2017-10-01 DIAGNOSIS — R293 Abnormal posture: Secondary | ICD-10-CM | POA: Diagnosis not present

## 2017-10-01 DIAGNOSIS — G8101 Flaccid hemiplegia affecting right dominant side: Secondary | ICD-10-CM | POA: Diagnosis not present

## 2017-10-02 DIAGNOSIS — R293 Abnormal posture: Secondary | ICD-10-CM | POA: Diagnosis not present

## 2017-10-02 DIAGNOSIS — G8101 Flaccid hemiplegia affecting right dominant side: Secondary | ICD-10-CM | POA: Diagnosis not present

## 2017-10-05 DIAGNOSIS — G8101 Flaccid hemiplegia affecting right dominant side: Secondary | ICD-10-CM | POA: Diagnosis not present

## 2017-10-05 DIAGNOSIS — R293 Abnormal posture: Secondary | ICD-10-CM | POA: Diagnosis not present

## 2017-10-06 DIAGNOSIS — R293 Abnormal posture: Secondary | ICD-10-CM | POA: Diagnosis not present

## 2017-10-06 DIAGNOSIS — G8101 Flaccid hemiplegia affecting right dominant side: Secondary | ICD-10-CM | POA: Diagnosis not present

## 2017-10-07 DIAGNOSIS — R293 Abnormal posture: Secondary | ICD-10-CM | POA: Diagnosis not present

## 2017-10-07 DIAGNOSIS — G8101 Flaccid hemiplegia affecting right dominant side: Secondary | ICD-10-CM | POA: Diagnosis not present

## 2017-10-08 DIAGNOSIS — G8101 Flaccid hemiplegia affecting right dominant side: Secondary | ICD-10-CM | POA: Diagnosis not present

## 2017-10-08 DIAGNOSIS — R293 Abnormal posture: Secondary | ICD-10-CM | POA: Diagnosis not present

## 2017-10-22 DIAGNOSIS — K668 Other specified disorders of peritoneum: Secondary | ICD-10-CM | POA: Diagnosis not present

## 2017-10-22 DIAGNOSIS — I82401 Acute embolism and thrombosis of unspecified deep veins of right lower extremity: Secondary | ICD-10-CM | POA: Diagnosis not present

## 2017-10-22 DIAGNOSIS — I10 Essential (primary) hypertension: Secondary | ICD-10-CM | POA: Diagnosis not present

## 2017-10-22 DIAGNOSIS — R21 Rash and other nonspecific skin eruption: Secondary | ICD-10-CM | POA: Diagnosis not present

## 2017-10-22 DIAGNOSIS — I69359 Hemiplegia and hemiparesis following cerebral infarction affecting unspecified side: Secondary | ICD-10-CM | POA: Diagnosis not present

## 2017-10-22 DIAGNOSIS — F322 Major depressive disorder, single episode, severe without psychotic features: Secondary | ICD-10-CM | POA: Diagnosis not present

## 2017-10-22 DIAGNOSIS — F0390 Unspecified dementia without behavioral disturbance: Secondary | ICD-10-CM | POA: Diagnosis not present

## 2017-10-22 DIAGNOSIS — R569 Unspecified convulsions: Secondary | ICD-10-CM | POA: Diagnosis not present

## 2017-11-11 DIAGNOSIS — G4700 Insomnia, unspecified: Secondary | ICD-10-CM | POA: Diagnosis not present

## 2017-11-11 DIAGNOSIS — R569 Unspecified convulsions: Secondary | ICD-10-CM | POA: Diagnosis not present

## 2017-11-11 DIAGNOSIS — F331 Major depressive disorder, recurrent, moderate: Secondary | ICD-10-CM | POA: Diagnosis not present

## 2017-11-11 DIAGNOSIS — I6990 Unspecified sequelae of unspecified cerebrovascular disease: Secondary | ICD-10-CM | POA: Diagnosis not present

## 2017-11-20 DIAGNOSIS — R2681 Unsteadiness on feet: Secondary | ICD-10-CM | POA: Diagnosis not present

## 2017-11-23 DIAGNOSIS — R2681 Unsteadiness on feet: Secondary | ICD-10-CM | POA: Diagnosis not present

## 2017-11-24 DIAGNOSIS — R2681 Unsteadiness on feet: Secondary | ICD-10-CM | POA: Diagnosis not present

## 2017-11-25 DIAGNOSIS — R2681 Unsteadiness on feet: Secondary | ICD-10-CM | POA: Diagnosis not present

## 2017-11-26 DIAGNOSIS — R21 Rash and other nonspecific skin eruption: Secondary | ICD-10-CM | POA: Diagnosis not present

## 2017-11-26 DIAGNOSIS — R569 Unspecified convulsions: Secondary | ICD-10-CM | POA: Diagnosis not present

## 2017-11-26 DIAGNOSIS — R2681 Unsteadiness on feet: Secondary | ICD-10-CM | POA: Diagnosis not present

## 2017-11-26 DIAGNOSIS — F322 Major depressive disorder, single episode, severe without psychotic features: Secondary | ICD-10-CM | POA: Diagnosis not present

## 2017-11-26 DIAGNOSIS — L859 Epidermal thickening, unspecified: Secondary | ICD-10-CM | POA: Diagnosis not present

## 2017-11-26 DIAGNOSIS — F0390 Unspecified dementia without behavioral disturbance: Secondary | ICD-10-CM | POA: Diagnosis not present

## 2017-11-27 DIAGNOSIS — R2681 Unsteadiness on feet: Secondary | ICD-10-CM | POA: Diagnosis not present

## 2017-11-30 DIAGNOSIS — R2681 Unsteadiness on feet: Secondary | ICD-10-CM | POA: Diagnosis not present

## 2017-12-01 DIAGNOSIS — R2681 Unsteadiness on feet: Secondary | ICD-10-CM | POA: Diagnosis not present

## 2017-12-02 DIAGNOSIS — R2681 Unsteadiness on feet: Secondary | ICD-10-CM | POA: Diagnosis not present

## 2017-12-03 DIAGNOSIS — R2681 Unsteadiness on feet: Secondary | ICD-10-CM | POA: Diagnosis not present

## 2017-12-04 DIAGNOSIS — R2681 Unsteadiness on feet: Secondary | ICD-10-CM | POA: Diagnosis not present

## 2017-12-07 DIAGNOSIS — R2681 Unsteadiness on feet: Secondary | ICD-10-CM | POA: Diagnosis not present

## 2017-12-08 DIAGNOSIS — R2681 Unsteadiness on feet: Secondary | ICD-10-CM | POA: Diagnosis not present

## 2017-12-09 DIAGNOSIS — R2681 Unsteadiness on feet: Secondary | ICD-10-CM | POA: Diagnosis not present

## 2017-12-10 DIAGNOSIS — R2681 Unsteadiness on feet: Secondary | ICD-10-CM | POA: Diagnosis not present

## 2017-12-12 DIAGNOSIS — R2681 Unsteadiness on feet: Secondary | ICD-10-CM | POA: Diagnosis not present

## 2017-12-14 DIAGNOSIS — R2681 Unsteadiness on feet: Secondary | ICD-10-CM | POA: Diagnosis not present

## 2017-12-15 DIAGNOSIS — R2681 Unsteadiness on feet: Secondary | ICD-10-CM | POA: Diagnosis not present

## 2017-12-16 DIAGNOSIS — R2681 Unsteadiness on feet: Secondary | ICD-10-CM | POA: Diagnosis not present

## 2017-12-17 DIAGNOSIS — R2681 Unsteadiness on feet: Secondary | ICD-10-CM | POA: Diagnosis not present

## 2017-12-18 DIAGNOSIS — R2681 Unsteadiness on feet: Secondary | ICD-10-CM | POA: Diagnosis not present

## 2017-12-21 DIAGNOSIS — R2681 Unsteadiness on feet: Secondary | ICD-10-CM | POA: Diagnosis not present

## 2017-12-22 DIAGNOSIS — R2681 Unsteadiness on feet: Secondary | ICD-10-CM | POA: Diagnosis not present

## 2017-12-23 DIAGNOSIS — R2681 Unsteadiness on feet: Secondary | ICD-10-CM | POA: Diagnosis not present

## 2017-12-23 DIAGNOSIS — F331 Major depressive disorder, recurrent, moderate: Secondary | ICD-10-CM | POA: Diagnosis not present

## 2017-12-23 DIAGNOSIS — G4700 Insomnia, unspecified: Secondary | ICD-10-CM | POA: Diagnosis not present

## 2017-12-24 DIAGNOSIS — R2681 Unsteadiness on feet: Secondary | ICD-10-CM | POA: Diagnosis not present

## 2017-12-25 DIAGNOSIS — R2681 Unsteadiness on feet: Secondary | ICD-10-CM | POA: Diagnosis not present

## 2017-12-31 DIAGNOSIS — I82401 Acute embolism and thrombosis of unspecified deep veins of right lower extremity: Secondary | ICD-10-CM | POA: Diagnosis not present

## 2017-12-31 DIAGNOSIS — F322 Major depressive disorder, single episode, severe without psychotic features: Secondary | ICD-10-CM | POA: Diagnosis not present

## 2017-12-31 DIAGNOSIS — I10 Essential (primary) hypertension: Secondary | ICD-10-CM | POA: Diagnosis not present

## 2017-12-31 DIAGNOSIS — I69359 Hemiplegia and hemiparesis following cerebral infarction affecting unspecified side: Secondary | ICD-10-CM | POA: Diagnosis not present

## 2017-12-31 DIAGNOSIS — F0390 Unspecified dementia without behavioral disturbance: Secondary | ICD-10-CM | POA: Diagnosis not present

## 2017-12-31 DIAGNOSIS — L859 Epidermal thickening, unspecified: Secondary | ICD-10-CM | POA: Diagnosis not present

## 2017-12-31 DIAGNOSIS — R569 Unspecified convulsions: Secondary | ICD-10-CM | POA: Diagnosis not present

## 2018-01-28 DIAGNOSIS — I82401 Acute embolism and thrombosis of unspecified deep veins of right lower extremity: Secondary | ICD-10-CM | POA: Diagnosis not present

## 2018-01-28 DIAGNOSIS — R21 Rash and other nonspecific skin eruption: Secondary | ICD-10-CM | POA: Diagnosis not present

## 2018-01-28 DIAGNOSIS — F322 Major depressive disorder, single episode, severe without psychotic features: Secondary | ICD-10-CM | POA: Diagnosis not present

## 2018-01-28 DIAGNOSIS — R569 Unspecified convulsions: Secondary | ICD-10-CM | POA: Diagnosis not present

## 2018-01-28 DIAGNOSIS — F0390 Unspecified dementia without behavioral disturbance: Secondary | ICD-10-CM | POA: Diagnosis not present

## 2018-01-28 DIAGNOSIS — I69359 Hemiplegia and hemiparesis following cerebral infarction affecting unspecified side: Secondary | ICD-10-CM | POA: Diagnosis not present

## 2018-01-28 DIAGNOSIS — F411 Generalized anxiety disorder: Secondary | ICD-10-CM | POA: Diagnosis not present

## 2018-01-28 DIAGNOSIS — I10 Essential (primary) hypertension: Secondary | ICD-10-CM | POA: Diagnosis not present

## 2018-02-17 DIAGNOSIS — F331 Major depressive disorder, recurrent, moderate: Secondary | ICD-10-CM | POA: Diagnosis not present

## 2018-02-17 DIAGNOSIS — G4700 Insomnia, unspecified: Secondary | ICD-10-CM | POA: Diagnosis not present

## 2018-02-19 DIAGNOSIS — L723 Sebaceous cyst: Secondary | ICD-10-CM | POA: Diagnosis not present

## 2018-02-25 DIAGNOSIS — K668 Other specified disorders of peritoneum: Secondary | ICD-10-CM | POA: Diagnosis not present

## 2018-02-25 DIAGNOSIS — I82401 Acute embolism and thrombosis of unspecified deep veins of right lower extremity: Secondary | ICD-10-CM | POA: Diagnosis not present

## 2018-02-25 DIAGNOSIS — I69359 Hemiplegia and hemiparesis following cerebral infarction affecting unspecified side: Secondary | ICD-10-CM | POA: Diagnosis not present

## 2018-02-25 DIAGNOSIS — I10 Essential (primary) hypertension: Secondary | ICD-10-CM | POA: Diagnosis not present

## 2018-02-25 DIAGNOSIS — I890 Lymphedema, not elsewhere classified: Secondary | ICD-10-CM | POA: Diagnosis not present

## 2018-02-25 DIAGNOSIS — F411 Generalized anxiety disorder: Secondary | ICD-10-CM | POA: Diagnosis not present

## 2018-02-25 DIAGNOSIS — F0390 Unspecified dementia without behavioral disturbance: Secondary | ICD-10-CM | POA: Diagnosis not present

## 2018-03-03 DIAGNOSIS — F331 Major depressive disorder, recurrent, moderate: Secondary | ICD-10-CM | POA: Diagnosis not present

## 2018-03-03 DIAGNOSIS — G4700 Insomnia, unspecified: Secondary | ICD-10-CM | POA: Diagnosis not present

## 2018-03-12 DIAGNOSIS — I6990 Unspecified sequelae of unspecified cerebrovascular disease: Secondary | ICD-10-CM | POA: Diagnosis not present

## 2018-03-12 DIAGNOSIS — M6281 Muscle weakness (generalized): Secondary | ICD-10-CM | POA: Diagnosis not present

## 2018-03-12 DIAGNOSIS — I69952 Hemiplegia and hemiparesis following unspecified cerebrovascular disease affecting left dominant side: Secondary | ICD-10-CM | POA: Diagnosis not present

## 2018-03-15 DIAGNOSIS — M6281 Muscle weakness (generalized): Secondary | ICD-10-CM | POA: Diagnosis not present

## 2018-03-15 DIAGNOSIS — I6990 Unspecified sequelae of unspecified cerebrovascular disease: Secondary | ICD-10-CM | POA: Diagnosis not present

## 2018-03-15 DIAGNOSIS — I69952 Hemiplegia and hemiparesis following unspecified cerebrovascular disease affecting left dominant side: Secondary | ICD-10-CM | POA: Diagnosis not present

## 2018-03-16 DIAGNOSIS — I69952 Hemiplegia and hemiparesis following unspecified cerebrovascular disease affecting left dominant side: Secondary | ICD-10-CM | POA: Diagnosis not present

## 2018-03-16 DIAGNOSIS — I6990 Unspecified sequelae of unspecified cerebrovascular disease: Secondary | ICD-10-CM | POA: Diagnosis not present

## 2018-03-16 DIAGNOSIS — M6281 Muscle weakness (generalized): Secondary | ICD-10-CM | POA: Diagnosis not present

## 2018-03-17 DIAGNOSIS — F331 Major depressive disorder, recurrent, moderate: Secondary | ICD-10-CM | POA: Diagnosis not present

## 2018-03-17 DIAGNOSIS — I69952 Hemiplegia and hemiparesis following unspecified cerebrovascular disease affecting left dominant side: Secondary | ICD-10-CM | POA: Diagnosis not present

## 2018-03-17 DIAGNOSIS — M6281 Muscle weakness (generalized): Secondary | ICD-10-CM | POA: Diagnosis not present

## 2018-03-17 DIAGNOSIS — I6990 Unspecified sequelae of unspecified cerebrovascular disease: Secondary | ICD-10-CM | POA: Diagnosis not present

## 2018-03-17 DIAGNOSIS — F39 Unspecified mood [affective] disorder: Secondary | ICD-10-CM | POA: Diagnosis not present

## 2018-03-17 DIAGNOSIS — G4700 Insomnia, unspecified: Secondary | ICD-10-CM | POA: Diagnosis not present

## 2018-03-18 DIAGNOSIS — I6990 Unspecified sequelae of unspecified cerebrovascular disease: Secondary | ICD-10-CM | POA: Diagnosis not present

## 2018-03-18 DIAGNOSIS — I69952 Hemiplegia and hemiparesis following unspecified cerebrovascular disease affecting left dominant side: Secondary | ICD-10-CM | POA: Diagnosis not present

## 2018-03-18 DIAGNOSIS — M6281 Muscle weakness (generalized): Secondary | ICD-10-CM | POA: Diagnosis not present

## 2018-03-19 DIAGNOSIS — I69952 Hemiplegia and hemiparesis following unspecified cerebrovascular disease affecting left dominant side: Secondary | ICD-10-CM | POA: Diagnosis not present

## 2018-03-19 DIAGNOSIS — I6990 Unspecified sequelae of unspecified cerebrovascular disease: Secondary | ICD-10-CM | POA: Diagnosis not present

## 2018-03-19 DIAGNOSIS — M6281 Muscle weakness (generalized): Secondary | ICD-10-CM | POA: Diagnosis not present

## 2018-03-22 DIAGNOSIS — M6281 Muscle weakness (generalized): Secondary | ICD-10-CM | POA: Diagnosis not present

## 2018-03-22 DIAGNOSIS — I69952 Hemiplegia and hemiparesis following unspecified cerebrovascular disease affecting left dominant side: Secondary | ICD-10-CM | POA: Diagnosis not present

## 2018-03-22 DIAGNOSIS — Z23 Encounter for immunization: Secondary | ICD-10-CM | POA: Diagnosis not present

## 2018-03-22 DIAGNOSIS — I6990 Unspecified sequelae of unspecified cerebrovascular disease: Secondary | ICD-10-CM | POA: Diagnosis not present

## 2018-03-23 DIAGNOSIS — M6281 Muscle weakness (generalized): Secondary | ICD-10-CM | POA: Diagnosis not present

## 2018-03-23 DIAGNOSIS — I69952 Hemiplegia and hemiparesis following unspecified cerebrovascular disease affecting left dominant side: Secondary | ICD-10-CM | POA: Diagnosis not present

## 2018-03-23 DIAGNOSIS — I6990 Unspecified sequelae of unspecified cerebrovascular disease: Secondary | ICD-10-CM | POA: Diagnosis not present

## 2018-03-24 DIAGNOSIS — I6990 Unspecified sequelae of unspecified cerebrovascular disease: Secondary | ICD-10-CM | POA: Diagnosis not present

## 2018-03-24 DIAGNOSIS — M6281 Muscle weakness (generalized): Secondary | ICD-10-CM | POA: Diagnosis not present

## 2018-03-24 DIAGNOSIS — I69952 Hemiplegia and hemiparesis following unspecified cerebrovascular disease affecting left dominant side: Secondary | ICD-10-CM | POA: Diagnosis not present

## 2018-03-25 DIAGNOSIS — I6990 Unspecified sequelae of unspecified cerebrovascular disease: Secondary | ICD-10-CM | POA: Diagnosis not present

## 2018-03-25 DIAGNOSIS — M6281 Muscle weakness (generalized): Secondary | ICD-10-CM | POA: Diagnosis not present

## 2018-03-25 DIAGNOSIS — I69952 Hemiplegia and hemiparesis following unspecified cerebrovascular disease affecting left dominant side: Secondary | ICD-10-CM | POA: Diagnosis not present

## 2018-03-26 DIAGNOSIS — I6990 Unspecified sequelae of unspecified cerebrovascular disease: Secondary | ICD-10-CM | POA: Diagnosis not present

## 2018-03-26 DIAGNOSIS — M6281 Muscle weakness (generalized): Secondary | ICD-10-CM | POA: Diagnosis not present

## 2018-03-26 DIAGNOSIS — I69952 Hemiplegia and hemiparesis following unspecified cerebrovascular disease affecting left dominant side: Secondary | ICD-10-CM | POA: Diagnosis not present

## 2018-03-29 DIAGNOSIS — I69952 Hemiplegia and hemiparesis following unspecified cerebrovascular disease affecting left dominant side: Secondary | ICD-10-CM | POA: Diagnosis not present

## 2018-03-29 DIAGNOSIS — I6990 Unspecified sequelae of unspecified cerebrovascular disease: Secondary | ICD-10-CM | POA: Diagnosis not present

## 2018-03-29 DIAGNOSIS — M6281 Muscle weakness (generalized): Secondary | ICD-10-CM | POA: Diagnosis not present

## 2018-03-30 DIAGNOSIS — M6281 Muscle weakness (generalized): Secondary | ICD-10-CM | POA: Diagnosis not present

## 2018-03-30 DIAGNOSIS — I6990 Unspecified sequelae of unspecified cerebrovascular disease: Secondary | ICD-10-CM | POA: Diagnosis not present

## 2018-03-30 DIAGNOSIS — I69952 Hemiplegia and hemiparesis following unspecified cerebrovascular disease affecting left dominant side: Secondary | ICD-10-CM | POA: Diagnosis not present

## 2018-03-31 DIAGNOSIS — M6281 Muscle weakness (generalized): Secondary | ICD-10-CM | POA: Diagnosis not present

## 2018-03-31 DIAGNOSIS — F331 Major depressive disorder, recurrent, moderate: Secondary | ICD-10-CM | POA: Diagnosis not present

## 2018-03-31 DIAGNOSIS — I6990 Unspecified sequelae of unspecified cerebrovascular disease: Secondary | ICD-10-CM | POA: Diagnosis not present

## 2018-03-31 DIAGNOSIS — I69952 Hemiplegia and hemiparesis following unspecified cerebrovascular disease affecting left dominant side: Secondary | ICD-10-CM | POA: Diagnosis not present

## 2018-03-31 DIAGNOSIS — G4700 Insomnia, unspecified: Secondary | ICD-10-CM | POA: Diagnosis not present

## 2018-04-01 DIAGNOSIS — M6281 Muscle weakness (generalized): Secondary | ICD-10-CM | POA: Diagnosis not present

## 2018-04-01 DIAGNOSIS — I69952 Hemiplegia and hemiparesis following unspecified cerebrovascular disease affecting left dominant side: Secondary | ICD-10-CM | POA: Diagnosis not present

## 2018-04-01 DIAGNOSIS — I6990 Unspecified sequelae of unspecified cerebrovascular disease: Secondary | ICD-10-CM | POA: Diagnosis not present

## 2018-04-02 DIAGNOSIS — M6281 Muscle weakness (generalized): Secondary | ICD-10-CM | POA: Diagnosis not present

## 2018-04-02 DIAGNOSIS — I69952 Hemiplegia and hemiparesis following unspecified cerebrovascular disease affecting left dominant side: Secondary | ICD-10-CM | POA: Diagnosis not present

## 2018-04-02 DIAGNOSIS — I6990 Unspecified sequelae of unspecified cerebrovascular disease: Secondary | ICD-10-CM | POA: Diagnosis not present

## 2018-04-05 DIAGNOSIS — M6281 Muscle weakness (generalized): Secondary | ICD-10-CM | POA: Diagnosis not present

## 2018-04-05 DIAGNOSIS — I6990 Unspecified sequelae of unspecified cerebrovascular disease: Secondary | ICD-10-CM | POA: Diagnosis not present

## 2018-04-05 DIAGNOSIS — I69952 Hemiplegia and hemiparesis following unspecified cerebrovascular disease affecting left dominant side: Secondary | ICD-10-CM | POA: Diagnosis not present

## 2018-04-06 DIAGNOSIS — I6990 Unspecified sequelae of unspecified cerebrovascular disease: Secondary | ICD-10-CM | POA: Diagnosis not present

## 2018-04-06 DIAGNOSIS — M6281 Muscle weakness (generalized): Secondary | ICD-10-CM | POA: Diagnosis not present

## 2018-04-06 DIAGNOSIS — I69952 Hemiplegia and hemiparesis following unspecified cerebrovascular disease affecting left dominant side: Secondary | ICD-10-CM | POA: Diagnosis not present

## 2018-04-07 DIAGNOSIS — M6281 Muscle weakness (generalized): Secondary | ICD-10-CM | POA: Diagnosis not present

## 2018-04-07 DIAGNOSIS — I6990 Unspecified sequelae of unspecified cerebrovascular disease: Secondary | ICD-10-CM | POA: Diagnosis not present

## 2018-04-07 DIAGNOSIS — I69952 Hemiplegia and hemiparesis following unspecified cerebrovascular disease affecting left dominant side: Secondary | ICD-10-CM | POA: Diagnosis not present

## 2018-04-08 DIAGNOSIS — I6990 Unspecified sequelae of unspecified cerebrovascular disease: Secondary | ICD-10-CM | POA: Diagnosis not present

## 2018-04-08 DIAGNOSIS — L859 Epidermal thickening, unspecified: Secondary | ICD-10-CM | POA: Diagnosis not present

## 2018-04-08 DIAGNOSIS — I82401 Acute embolism and thrombosis of unspecified deep veins of right lower extremity: Secondary | ICD-10-CM | POA: Diagnosis not present

## 2018-04-08 DIAGNOSIS — I69952 Hemiplegia and hemiparesis following unspecified cerebrovascular disease affecting left dominant side: Secondary | ICD-10-CM | POA: Diagnosis not present

## 2018-04-08 DIAGNOSIS — M6281 Muscle weakness (generalized): Secondary | ICD-10-CM | POA: Diagnosis not present

## 2018-04-08 DIAGNOSIS — F0390 Unspecified dementia without behavioral disturbance: Secondary | ICD-10-CM | POA: Diagnosis not present

## 2018-04-08 DIAGNOSIS — R569 Unspecified convulsions: Secondary | ICD-10-CM | POA: Diagnosis not present

## 2018-04-08 DIAGNOSIS — F322 Major depressive disorder, single episode, severe without psychotic features: Secondary | ICD-10-CM | POA: Diagnosis not present

## 2018-04-08 DIAGNOSIS — I10 Essential (primary) hypertension: Secondary | ICD-10-CM | POA: Diagnosis not present

## 2018-04-09 DIAGNOSIS — G609 Hereditary and idiopathic neuropathy, unspecified: Secondary | ICD-10-CM | POA: Diagnosis not present

## 2018-04-09 DIAGNOSIS — I6990 Unspecified sequelae of unspecified cerebrovascular disease: Secondary | ICD-10-CM | POA: Diagnosis not present

## 2018-04-09 DIAGNOSIS — I69952 Hemiplegia and hemiparesis following unspecified cerebrovascular disease affecting left dominant side: Secondary | ICD-10-CM | POA: Diagnosis not present

## 2018-04-09 DIAGNOSIS — R2681 Unsteadiness on feet: Secondary | ICD-10-CM | POA: Diagnosis not present

## 2018-04-12 DIAGNOSIS — I69952 Hemiplegia and hemiparesis following unspecified cerebrovascular disease affecting left dominant side: Secondary | ICD-10-CM | POA: Diagnosis not present

## 2018-04-12 DIAGNOSIS — R2681 Unsteadiness on feet: Secondary | ICD-10-CM | POA: Diagnosis not present

## 2018-04-12 DIAGNOSIS — G609 Hereditary and idiopathic neuropathy, unspecified: Secondary | ICD-10-CM | POA: Diagnosis not present

## 2018-04-12 DIAGNOSIS — I6990 Unspecified sequelae of unspecified cerebrovascular disease: Secondary | ICD-10-CM | POA: Diagnosis not present

## 2018-04-13 DIAGNOSIS — G609 Hereditary and idiopathic neuropathy, unspecified: Secondary | ICD-10-CM | POA: Diagnosis not present

## 2018-04-13 DIAGNOSIS — I6990 Unspecified sequelae of unspecified cerebrovascular disease: Secondary | ICD-10-CM | POA: Diagnosis not present

## 2018-04-13 DIAGNOSIS — R2681 Unsteadiness on feet: Secondary | ICD-10-CM | POA: Diagnosis not present

## 2018-04-13 DIAGNOSIS — I69952 Hemiplegia and hemiparesis following unspecified cerebrovascular disease affecting left dominant side: Secondary | ICD-10-CM | POA: Diagnosis not present

## 2018-04-14 DIAGNOSIS — I6990 Unspecified sequelae of unspecified cerebrovascular disease: Secondary | ICD-10-CM | POA: Diagnosis not present

## 2018-04-14 DIAGNOSIS — G609 Hereditary and idiopathic neuropathy, unspecified: Secondary | ICD-10-CM | POA: Diagnosis not present

## 2018-04-14 DIAGNOSIS — I69952 Hemiplegia and hemiparesis following unspecified cerebrovascular disease affecting left dominant side: Secondary | ICD-10-CM | POA: Diagnosis not present

## 2018-04-14 DIAGNOSIS — R2681 Unsteadiness on feet: Secondary | ICD-10-CM | POA: Diagnosis not present

## 2018-04-15 DIAGNOSIS — I69952 Hemiplegia and hemiparesis following unspecified cerebrovascular disease affecting left dominant side: Secondary | ICD-10-CM | POA: Diagnosis not present

## 2018-04-15 DIAGNOSIS — I6990 Unspecified sequelae of unspecified cerebrovascular disease: Secondary | ICD-10-CM | POA: Diagnosis not present

## 2018-04-15 DIAGNOSIS — R2681 Unsteadiness on feet: Secondary | ICD-10-CM | POA: Diagnosis not present

## 2018-04-15 DIAGNOSIS — G609 Hereditary and idiopathic neuropathy, unspecified: Secondary | ICD-10-CM | POA: Diagnosis not present

## 2018-04-16 DIAGNOSIS — I6990 Unspecified sequelae of unspecified cerebrovascular disease: Secondary | ICD-10-CM | POA: Diagnosis not present

## 2018-04-16 DIAGNOSIS — G609 Hereditary and idiopathic neuropathy, unspecified: Secondary | ICD-10-CM | POA: Diagnosis not present

## 2018-04-16 DIAGNOSIS — R2681 Unsteadiness on feet: Secondary | ICD-10-CM | POA: Diagnosis not present

## 2018-04-16 DIAGNOSIS — I69952 Hemiplegia and hemiparesis following unspecified cerebrovascular disease affecting left dominant side: Secondary | ICD-10-CM | POA: Diagnosis not present

## 2018-04-19 DIAGNOSIS — G609 Hereditary and idiopathic neuropathy, unspecified: Secondary | ICD-10-CM | POA: Diagnosis not present

## 2018-04-19 DIAGNOSIS — I69952 Hemiplegia and hemiparesis following unspecified cerebrovascular disease affecting left dominant side: Secondary | ICD-10-CM | POA: Diagnosis not present

## 2018-04-19 DIAGNOSIS — I6990 Unspecified sequelae of unspecified cerebrovascular disease: Secondary | ICD-10-CM | POA: Diagnosis not present

## 2018-04-19 DIAGNOSIS — R2681 Unsteadiness on feet: Secondary | ICD-10-CM | POA: Diagnosis not present

## 2018-04-20 DIAGNOSIS — G609 Hereditary and idiopathic neuropathy, unspecified: Secondary | ICD-10-CM | POA: Diagnosis not present

## 2018-04-20 DIAGNOSIS — I69952 Hemiplegia and hemiparesis following unspecified cerebrovascular disease affecting left dominant side: Secondary | ICD-10-CM | POA: Diagnosis not present

## 2018-04-20 DIAGNOSIS — I6990 Unspecified sequelae of unspecified cerebrovascular disease: Secondary | ICD-10-CM | POA: Diagnosis not present

## 2018-04-20 DIAGNOSIS — R2681 Unsteadiness on feet: Secondary | ICD-10-CM | POA: Diagnosis not present

## 2018-04-21 DIAGNOSIS — G609 Hereditary and idiopathic neuropathy, unspecified: Secondary | ICD-10-CM | POA: Diagnosis not present

## 2018-04-21 DIAGNOSIS — I69952 Hemiplegia and hemiparesis following unspecified cerebrovascular disease affecting left dominant side: Secondary | ICD-10-CM | POA: Diagnosis not present

## 2018-04-21 DIAGNOSIS — R2681 Unsteadiness on feet: Secondary | ICD-10-CM | POA: Diagnosis not present

## 2018-04-21 DIAGNOSIS — I6990 Unspecified sequelae of unspecified cerebrovascular disease: Secondary | ICD-10-CM | POA: Diagnosis not present

## 2018-04-22 DIAGNOSIS — R569 Unspecified convulsions: Secondary | ICD-10-CM | POA: Diagnosis not present

## 2018-04-22 DIAGNOSIS — G609 Hereditary and idiopathic neuropathy, unspecified: Secondary | ICD-10-CM | POA: Diagnosis not present

## 2018-04-22 DIAGNOSIS — I69359 Hemiplegia and hemiparesis following cerebral infarction affecting unspecified side: Secondary | ICD-10-CM | POA: Diagnosis not present

## 2018-04-22 DIAGNOSIS — F411 Generalized anxiety disorder: Secondary | ICD-10-CM | POA: Diagnosis not present

## 2018-04-22 DIAGNOSIS — I82401 Acute embolism and thrombosis of unspecified deep veins of right lower extremity: Secondary | ICD-10-CM | POA: Diagnosis not present

## 2018-04-22 DIAGNOSIS — I6990 Unspecified sequelae of unspecified cerebrovascular disease: Secondary | ICD-10-CM | POA: Diagnosis not present

## 2018-04-22 DIAGNOSIS — L859 Epidermal thickening, unspecified: Secondary | ICD-10-CM | POA: Diagnosis not present

## 2018-04-22 DIAGNOSIS — R2681 Unsteadiness on feet: Secondary | ICD-10-CM | POA: Diagnosis not present

## 2018-04-22 DIAGNOSIS — I69952 Hemiplegia and hemiparesis following unspecified cerebrovascular disease affecting left dominant side: Secondary | ICD-10-CM | POA: Diagnosis not present

## 2018-04-22 DIAGNOSIS — F0390 Unspecified dementia without behavioral disturbance: Secondary | ICD-10-CM | POA: Diagnosis not present

## 2018-04-23 DIAGNOSIS — R2681 Unsteadiness on feet: Secondary | ICD-10-CM | POA: Diagnosis not present

## 2018-04-23 DIAGNOSIS — G609 Hereditary and idiopathic neuropathy, unspecified: Secondary | ICD-10-CM | POA: Diagnosis not present

## 2018-04-23 DIAGNOSIS — I6990 Unspecified sequelae of unspecified cerebrovascular disease: Secondary | ICD-10-CM | POA: Diagnosis not present

## 2018-04-23 DIAGNOSIS — I69952 Hemiplegia and hemiparesis following unspecified cerebrovascular disease affecting left dominant side: Secondary | ICD-10-CM | POA: Diagnosis not present

## 2018-04-26 DIAGNOSIS — R2681 Unsteadiness on feet: Secondary | ICD-10-CM | POA: Diagnosis not present

## 2018-04-26 DIAGNOSIS — I69952 Hemiplegia and hemiparesis following unspecified cerebrovascular disease affecting left dominant side: Secondary | ICD-10-CM | POA: Diagnosis not present

## 2018-04-26 DIAGNOSIS — G609 Hereditary and idiopathic neuropathy, unspecified: Secondary | ICD-10-CM | POA: Diagnosis not present

## 2018-04-26 DIAGNOSIS — I6990 Unspecified sequelae of unspecified cerebrovascular disease: Secondary | ICD-10-CM | POA: Diagnosis not present

## 2018-04-27 DIAGNOSIS — I6990 Unspecified sequelae of unspecified cerebrovascular disease: Secondary | ICD-10-CM | POA: Diagnosis not present

## 2018-04-27 DIAGNOSIS — G609 Hereditary and idiopathic neuropathy, unspecified: Secondary | ICD-10-CM | POA: Diagnosis not present

## 2018-04-27 DIAGNOSIS — I69952 Hemiplegia and hemiparesis following unspecified cerebrovascular disease affecting left dominant side: Secondary | ICD-10-CM | POA: Diagnosis not present

## 2018-04-27 DIAGNOSIS — R2681 Unsteadiness on feet: Secondary | ICD-10-CM | POA: Diagnosis not present

## 2018-04-28 DIAGNOSIS — R2681 Unsteadiness on feet: Secondary | ICD-10-CM | POA: Diagnosis not present

## 2018-04-28 DIAGNOSIS — I6990 Unspecified sequelae of unspecified cerebrovascular disease: Secondary | ICD-10-CM | POA: Diagnosis not present

## 2018-04-28 DIAGNOSIS — I69952 Hemiplegia and hemiparesis following unspecified cerebrovascular disease affecting left dominant side: Secondary | ICD-10-CM | POA: Diagnosis not present

## 2018-04-28 DIAGNOSIS — G609 Hereditary and idiopathic neuropathy, unspecified: Secondary | ICD-10-CM | POA: Diagnosis not present

## 2018-04-30 DIAGNOSIS — I69952 Hemiplegia and hemiparesis following unspecified cerebrovascular disease affecting left dominant side: Secondary | ICD-10-CM | POA: Diagnosis not present

## 2018-04-30 DIAGNOSIS — I6990 Unspecified sequelae of unspecified cerebrovascular disease: Secondary | ICD-10-CM | POA: Diagnosis not present

## 2018-04-30 DIAGNOSIS — G609 Hereditary and idiopathic neuropathy, unspecified: Secondary | ICD-10-CM | POA: Diagnosis not present

## 2018-04-30 DIAGNOSIS — R2681 Unsteadiness on feet: Secondary | ICD-10-CM | POA: Diagnosis not present

## 2018-05-01 DIAGNOSIS — R2681 Unsteadiness on feet: Secondary | ICD-10-CM | POA: Diagnosis not present

## 2018-05-01 DIAGNOSIS — G609 Hereditary and idiopathic neuropathy, unspecified: Secondary | ICD-10-CM | POA: Diagnosis not present

## 2018-05-01 DIAGNOSIS — I6990 Unspecified sequelae of unspecified cerebrovascular disease: Secondary | ICD-10-CM | POA: Diagnosis not present

## 2018-05-01 DIAGNOSIS — I69952 Hemiplegia and hemiparesis following unspecified cerebrovascular disease affecting left dominant side: Secondary | ICD-10-CM | POA: Diagnosis not present

## 2018-05-03 DIAGNOSIS — R2681 Unsteadiness on feet: Secondary | ICD-10-CM | POA: Diagnosis not present

## 2018-05-03 DIAGNOSIS — I69952 Hemiplegia and hemiparesis following unspecified cerebrovascular disease affecting left dominant side: Secondary | ICD-10-CM | POA: Diagnosis not present

## 2018-05-03 DIAGNOSIS — I6990 Unspecified sequelae of unspecified cerebrovascular disease: Secondary | ICD-10-CM | POA: Diagnosis not present

## 2018-05-03 DIAGNOSIS — G609 Hereditary and idiopathic neuropathy, unspecified: Secondary | ICD-10-CM | POA: Diagnosis not present

## 2018-05-04 DIAGNOSIS — G609 Hereditary and idiopathic neuropathy, unspecified: Secondary | ICD-10-CM | POA: Diagnosis not present

## 2018-05-04 DIAGNOSIS — R2681 Unsteadiness on feet: Secondary | ICD-10-CM | POA: Diagnosis not present

## 2018-05-04 DIAGNOSIS — I6990 Unspecified sequelae of unspecified cerebrovascular disease: Secondary | ICD-10-CM | POA: Diagnosis not present

## 2018-05-04 DIAGNOSIS — I69952 Hemiplegia and hemiparesis following unspecified cerebrovascular disease affecting left dominant side: Secondary | ICD-10-CM | POA: Diagnosis not present

## 2018-05-06 DIAGNOSIS — G609 Hereditary and idiopathic neuropathy, unspecified: Secondary | ICD-10-CM | POA: Diagnosis not present

## 2018-05-06 DIAGNOSIS — R2681 Unsteadiness on feet: Secondary | ICD-10-CM | POA: Diagnosis not present

## 2018-05-06 DIAGNOSIS — I69952 Hemiplegia and hemiparesis following unspecified cerebrovascular disease affecting left dominant side: Secondary | ICD-10-CM | POA: Diagnosis not present

## 2018-05-06 DIAGNOSIS — I6990 Unspecified sequelae of unspecified cerebrovascular disease: Secondary | ICD-10-CM | POA: Diagnosis not present

## 2018-05-07 DIAGNOSIS — I69952 Hemiplegia and hemiparesis following unspecified cerebrovascular disease affecting left dominant side: Secondary | ICD-10-CM | POA: Diagnosis not present

## 2018-05-07 DIAGNOSIS — R2681 Unsteadiness on feet: Secondary | ICD-10-CM | POA: Diagnosis not present

## 2018-05-07 DIAGNOSIS — I6990 Unspecified sequelae of unspecified cerebrovascular disease: Secondary | ICD-10-CM | POA: Diagnosis not present

## 2018-05-07 DIAGNOSIS — G609 Hereditary and idiopathic neuropathy, unspecified: Secondary | ICD-10-CM | POA: Diagnosis not present

## 2018-05-08 DIAGNOSIS — G609 Hereditary and idiopathic neuropathy, unspecified: Secondary | ICD-10-CM | POA: Diagnosis not present

## 2018-05-08 DIAGNOSIS — I6990 Unspecified sequelae of unspecified cerebrovascular disease: Secondary | ICD-10-CM | POA: Diagnosis not present

## 2018-05-08 DIAGNOSIS — R2681 Unsteadiness on feet: Secondary | ICD-10-CM | POA: Diagnosis not present

## 2018-05-08 DIAGNOSIS — I69952 Hemiplegia and hemiparesis following unspecified cerebrovascular disease affecting left dominant side: Secondary | ICD-10-CM | POA: Diagnosis not present

## 2018-05-10 DIAGNOSIS — R2681 Unsteadiness on feet: Secondary | ICD-10-CM | POA: Diagnosis not present

## 2018-05-10 DIAGNOSIS — L03119 Cellulitis of unspecified part of limb: Secondary | ICD-10-CM | POA: Diagnosis not present

## 2018-05-10 DIAGNOSIS — R2689 Other abnormalities of gait and mobility: Secondary | ICD-10-CM | POA: Diagnosis not present

## 2018-05-10 DIAGNOSIS — I6990 Unspecified sequelae of unspecified cerebrovascular disease: Secondary | ICD-10-CM | POA: Diagnosis not present

## 2018-05-11 DIAGNOSIS — Z7901 Long term (current) use of anticoagulants: Secondary | ICD-10-CM | POA: Diagnosis not present

## 2018-05-11 DIAGNOSIS — R2689 Other abnormalities of gait and mobility: Secondary | ICD-10-CM | POA: Diagnosis not present

## 2018-05-11 DIAGNOSIS — L03119 Cellulitis of unspecified part of limb: Secondary | ICD-10-CM | POA: Diagnosis not present

## 2018-05-11 DIAGNOSIS — R2681 Unsteadiness on feet: Secondary | ICD-10-CM | POA: Diagnosis not present

## 2018-05-11 DIAGNOSIS — I6990 Unspecified sequelae of unspecified cerebrovascular disease: Secondary | ICD-10-CM | POA: Diagnosis not present

## 2018-05-12 DIAGNOSIS — R2681 Unsteadiness on feet: Secondary | ICD-10-CM | POA: Diagnosis not present

## 2018-05-12 DIAGNOSIS — R2689 Other abnormalities of gait and mobility: Secondary | ICD-10-CM | POA: Diagnosis not present

## 2018-05-12 DIAGNOSIS — R569 Unspecified convulsions: Secondary | ICD-10-CM | POA: Diagnosis not present

## 2018-05-12 DIAGNOSIS — L03119 Cellulitis of unspecified part of limb: Secondary | ICD-10-CM | POA: Diagnosis not present

## 2018-05-12 DIAGNOSIS — I6990 Unspecified sequelae of unspecified cerebrovascular disease: Secondary | ICD-10-CM | POA: Diagnosis not present

## 2018-05-13 DIAGNOSIS — R2689 Other abnormalities of gait and mobility: Secondary | ICD-10-CM | POA: Diagnosis not present

## 2018-05-13 DIAGNOSIS — R2681 Unsteadiness on feet: Secondary | ICD-10-CM | POA: Diagnosis not present

## 2018-05-13 DIAGNOSIS — L03119 Cellulitis of unspecified part of limb: Secondary | ICD-10-CM | POA: Diagnosis not present

## 2018-05-13 DIAGNOSIS — I6990 Unspecified sequelae of unspecified cerebrovascular disease: Secondary | ICD-10-CM | POA: Diagnosis not present

## 2018-05-14 DIAGNOSIS — I6990 Unspecified sequelae of unspecified cerebrovascular disease: Secondary | ICD-10-CM | POA: Diagnosis not present

## 2018-05-14 DIAGNOSIS — R2681 Unsteadiness on feet: Secondary | ICD-10-CM | POA: Diagnosis not present

## 2018-05-14 DIAGNOSIS — R2689 Other abnormalities of gait and mobility: Secondary | ICD-10-CM | POA: Diagnosis not present

## 2018-05-14 DIAGNOSIS — L03119 Cellulitis of unspecified part of limb: Secondary | ICD-10-CM | POA: Diagnosis not present

## 2018-05-17 DIAGNOSIS — R2681 Unsteadiness on feet: Secondary | ICD-10-CM | POA: Diagnosis not present

## 2018-05-17 DIAGNOSIS — R2689 Other abnormalities of gait and mobility: Secondary | ICD-10-CM | POA: Diagnosis not present

## 2018-05-17 DIAGNOSIS — I517 Cardiomegaly: Secondary | ICD-10-CM | POA: Diagnosis not present

## 2018-05-17 DIAGNOSIS — L03119 Cellulitis of unspecified part of limb: Secondary | ICD-10-CM | POA: Diagnosis not present

## 2018-05-17 DIAGNOSIS — I6990 Unspecified sequelae of unspecified cerebrovascular disease: Secondary | ICD-10-CM | POA: Diagnosis not present

## 2018-05-18 DIAGNOSIS — E08319 Diabetes mellitus due to underlying condition with unspecified diabetic retinopathy without macular edema: Secondary | ICD-10-CM | POA: Diagnosis not present

## 2018-05-18 DIAGNOSIS — I6990 Unspecified sequelae of unspecified cerebrovascular disease: Secondary | ICD-10-CM | POA: Diagnosis not present

## 2018-05-18 DIAGNOSIS — R2689 Other abnormalities of gait and mobility: Secondary | ICD-10-CM | POA: Diagnosis not present

## 2018-05-18 DIAGNOSIS — R2681 Unsteadiness on feet: Secondary | ICD-10-CM | POA: Diagnosis not present

## 2018-05-18 DIAGNOSIS — L03119 Cellulitis of unspecified part of limb: Secondary | ICD-10-CM | POA: Diagnosis not present

## 2018-05-18 DIAGNOSIS — Z7901 Long term (current) use of anticoagulants: Secondary | ICD-10-CM | POA: Diagnosis not present

## 2018-05-18 DIAGNOSIS — N390 Urinary tract infection, site not specified: Secondary | ICD-10-CM | POA: Diagnosis not present

## 2018-05-19 DIAGNOSIS — L03119 Cellulitis of unspecified part of limb: Secondary | ICD-10-CM | POA: Diagnosis not present

## 2018-05-19 DIAGNOSIS — R2681 Unsteadiness on feet: Secondary | ICD-10-CM | POA: Diagnosis not present

## 2018-05-19 DIAGNOSIS — R2689 Other abnormalities of gait and mobility: Secondary | ICD-10-CM | POA: Diagnosis not present

## 2018-05-19 DIAGNOSIS — I6990 Unspecified sequelae of unspecified cerebrovascular disease: Secondary | ICD-10-CM | POA: Diagnosis not present

## 2018-05-20 DIAGNOSIS — F0390 Unspecified dementia without behavioral disturbance: Secondary | ICD-10-CM | POA: Diagnosis not present

## 2018-05-20 DIAGNOSIS — I6990 Unspecified sequelae of unspecified cerebrovascular disease: Secondary | ICD-10-CM | POA: Diagnosis not present

## 2018-05-20 DIAGNOSIS — I82401 Acute embolism and thrombosis of unspecified deep veins of right lower extremity: Secondary | ICD-10-CM | POA: Diagnosis not present

## 2018-05-20 DIAGNOSIS — L03119 Cellulitis of unspecified part of limb: Secondary | ICD-10-CM | POA: Diagnosis not present

## 2018-05-20 DIAGNOSIS — R2689 Other abnormalities of gait and mobility: Secondary | ICD-10-CM | POA: Diagnosis not present

## 2018-05-20 DIAGNOSIS — I69359 Hemiplegia and hemiparesis following cerebral infarction affecting unspecified side: Secondary | ICD-10-CM | POA: Diagnosis not present

## 2018-05-20 DIAGNOSIS — F322 Major depressive disorder, single episode, severe without psychotic features: Secondary | ICD-10-CM | POA: Diagnosis not present

## 2018-05-20 DIAGNOSIS — F411 Generalized anxiety disorder: Secondary | ICD-10-CM | POA: Diagnosis not present

## 2018-05-20 DIAGNOSIS — R569 Unspecified convulsions: Secondary | ICD-10-CM | POA: Diagnosis not present

## 2018-05-20 DIAGNOSIS — L859 Epidermal thickening, unspecified: Secondary | ICD-10-CM | POA: Diagnosis not present

## 2018-05-20 DIAGNOSIS — R05 Cough: Secondary | ICD-10-CM | POA: Diagnosis not present

## 2018-05-20 DIAGNOSIS — R2681 Unsteadiness on feet: Secondary | ICD-10-CM | POA: Diagnosis not present

## 2018-05-21 DIAGNOSIS — I6990 Unspecified sequelae of unspecified cerebrovascular disease: Secondary | ICD-10-CM | POA: Diagnosis not present

## 2018-05-21 DIAGNOSIS — R2689 Other abnormalities of gait and mobility: Secondary | ICD-10-CM | POA: Diagnosis not present

## 2018-05-21 DIAGNOSIS — R2681 Unsteadiness on feet: Secondary | ICD-10-CM | POA: Diagnosis not present

## 2018-05-21 DIAGNOSIS — L03119 Cellulitis of unspecified part of limb: Secondary | ICD-10-CM | POA: Diagnosis not present

## 2018-05-24 DIAGNOSIS — L03119 Cellulitis of unspecified part of limb: Secondary | ICD-10-CM | POA: Diagnosis not present

## 2018-05-24 DIAGNOSIS — R2689 Other abnormalities of gait and mobility: Secondary | ICD-10-CM | POA: Diagnosis not present

## 2018-05-24 DIAGNOSIS — I6990 Unspecified sequelae of unspecified cerebrovascular disease: Secondary | ICD-10-CM | POA: Diagnosis not present

## 2018-05-24 DIAGNOSIS — R2681 Unsteadiness on feet: Secondary | ICD-10-CM | POA: Diagnosis not present

## 2018-05-25 DIAGNOSIS — R2689 Other abnormalities of gait and mobility: Secondary | ICD-10-CM | POA: Diagnosis not present

## 2018-05-25 DIAGNOSIS — R2681 Unsteadiness on feet: Secondary | ICD-10-CM | POA: Diagnosis not present

## 2018-05-25 DIAGNOSIS — I6990 Unspecified sequelae of unspecified cerebrovascular disease: Secondary | ICD-10-CM | POA: Diagnosis not present

## 2018-05-25 DIAGNOSIS — L03119 Cellulitis of unspecified part of limb: Secondary | ICD-10-CM | POA: Diagnosis not present

## 2018-05-26 DIAGNOSIS — I6990 Unspecified sequelae of unspecified cerebrovascular disease: Secondary | ICD-10-CM | POA: Diagnosis not present

## 2018-05-26 DIAGNOSIS — L03119 Cellulitis of unspecified part of limb: Secondary | ICD-10-CM | POA: Diagnosis not present

## 2018-05-26 DIAGNOSIS — G4700 Insomnia, unspecified: Secondary | ICD-10-CM | POA: Diagnosis not present

## 2018-05-26 DIAGNOSIS — R2689 Other abnormalities of gait and mobility: Secondary | ICD-10-CM | POA: Diagnosis not present

## 2018-05-26 DIAGNOSIS — R2681 Unsteadiness on feet: Secondary | ICD-10-CM | POA: Diagnosis not present

## 2018-05-26 DIAGNOSIS — F331 Major depressive disorder, recurrent, moderate: Secondary | ICD-10-CM | POA: Diagnosis not present

## 2018-05-27 DIAGNOSIS — R2689 Other abnormalities of gait and mobility: Secondary | ICD-10-CM | POA: Diagnosis not present

## 2018-05-27 DIAGNOSIS — R2681 Unsteadiness on feet: Secondary | ICD-10-CM | POA: Diagnosis not present

## 2018-05-27 DIAGNOSIS — I6990 Unspecified sequelae of unspecified cerebrovascular disease: Secondary | ICD-10-CM | POA: Diagnosis not present

## 2018-05-27 DIAGNOSIS — L03119 Cellulitis of unspecified part of limb: Secondary | ICD-10-CM | POA: Diagnosis not present

## 2018-05-28 DIAGNOSIS — L03119 Cellulitis of unspecified part of limb: Secondary | ICD-10-CM | POA: Diagnosis not present

## 2018-05-28 DIAGNOSIS — I6990 Unspecified sequelae of unspecified cerebrovascular disease: Secondary | ICD-10-CM | POA: Diagnosis not present

## 2018-05-28 DIAGNOSIS — R2689 Other abnormalities of gait and mobility: Secondary | ICD-10-CM | POA: Diagnosis not present

## 2018-05-28 DIAGNOSIS — R2681 Unsteadiness on feet: Secondary | ICD-10-CM | POA: Diagnosis not present

## 2018-05-31 DIAGNOSIS — R2689 Other abnormalities of gait and mobility: Secondary | ICD-10-CM | POA: Diagnosis not present

## 2018-05-31 DIAGNOSIS — I6990 Unspecified sequelae of unspecified cerebrovascular disease: Secondary | ICD-10-CM | POA: Diagnosis not present

## 2018-05-31 DIAGNOSIS — L03119 Cellulitis of unspecified part of limb: Secondary | ICD-10-CM | POA: Diagnosis not present

## 2018-05-31 DIAGNOSIS — R2681 Unsteadiness on feet: Secondary | ICD-10-CM | POA: Diagnosis not present

## 2018-06-01 DIAGNOSIS — R2681 Unsteadiness on feet: Secondary | ICD-10-CM | POA: Diagnosis not present

## 2018-06-01 DIAGNOSIS — L03119 Cellulitis of unspecified part of limb: Secondary | ICD-10-CM | POA: Diagnosis not present

## 2018-06-01 DIAGNOSIS — R2689 Other abnormalities of gait and mobility: Secondary | ICD-10-CM | POA: Diagnosis not present

## 2018-06-01 DIAGNOSIS — I6990 Unspecified sequelae of unspecified cerebrovascular disease: Secondary | ICD-10-CM | POA: Diagnosis not present

## 2018-06-02 DIAGNOSIS — L03119 Cellulitis of unspecified part of limb: Secondary | ICD-10-CM | POA: Diagnosis not present

## 2018-06-02 DIAGNOSIS — R2689 Other abnormalities of gait and mobility: Secondary | ICD-10-CM | POA: Diagnosis not present

## 2018-06-02 DIAGNOSIS — I6990 Unspecified sequelae of unspecified cerebrovascular disease: Secondary | ICD-10-CM | POA: Diagnosis not present

## 2018-06-02 DIAGNOSIS — R2681 Unsteadiness on feet: Secondary | ICD-10-CM | POA: Diagnosis not present

## 2018-06-03 DIAGNOSIS — R2681 Unsteadiness on feet: Secondary | ICD-10-CM | POA: Diagnosis not present

## 2018-06-03 DIAGNOSIS — L03119 Cellulitis of unspecified part of limb: Secondary | ICD-10-CM | POA: Diagnosis not present

## 2018-06-03 DIAGNOSIS — R2689 Other abnormalities of gait and mobility: Secondary | ICD-10-CM | POA: Diagnosis not present

## 2018-06-03 DIAGNOSIS — I6990 Unspecified sequelae of unspecified cerebrovascular disease: Secondary | ICD-10-CM | POA: Diagnosis not present

## 2018-06-04 DIAGNOSIS — L03119 Cellulitis of unspecified part of limb: Secondary | ICD-10-CM | POA: Diagnosis not present

## 2018-06-04 DIAGNOSIS — R2681 Unsteadiness on feet: Secondary | ICD-10-CM | POA: Diagnosis not present

## 2018-06-04 DIAGNOSIS — R2689 Other abnormalities of gait and mobility: Secondary | ICD-10-CM | POA: Diagnosis not present

## 2018-06-04 DIAGNOSIS — I6990 Unspecified sequelae of unspecified cerebrovascular disease: Secondary | ICD-10-CM | POA: Diagnosis not present

## 2018-06-17 DIAGNOSIS — F0390 Unspecified dementia without behavioral disturbance: Secondary | ICD-10-CM | POA: Diagnosis not present

## 2018-06-17 DIAGNOSIS — I10 Essential (primary) hypertension: Secondary | ICD-10-CM | POA: Diagnosis not present

## 2018-06-17 DIAGNOSIS — R569 Unspecified convulsions: Secondary | ICD-10-CM | POA: Diagnosis not present

## 2018-06-17 DIAGNOSIS — K5900 Constipation, unspecified: Secondary | ICD-10-CM | POA: Diagnosis not present

## 2018-06-17 DIAGNOSIS — F322 Major depressive disorder, single episode, severe without psychotic features: Secondary | ICD-10-CM | POA: Diagnosis not present

## 2018-06-17 DIAGNOSIS — I82401 Acute embolism and thrombosis of unspecified deep veins of right lower extremity: Secondary | ICD-10-CM | POA: Diagnosis not present

## 2018-06-17 DIAGNOSIS — I69359 Hemiplegia and hemiparesis following cerebral infarction affecting unspecified side: Secondary | ICD-10-CM | POA: Diagnosis not present

## 2018-06-29 DIAGNOSIS — I69952 Hemiplegia and hemiparesis following unspecified cerebrovascular disease affecting left dominant side: Secondary | ICD-10-CM | POA: Diagnosis not present

## 2018-07-22 DIAGNOSIS — R569 Unspecified convulsions: Secondary | ICD-10-CM | POA: Diagnosis not present

## 2018-07-22 DIAGNOSIS — I10 Essential (primary) hypertension: Secondary | ICD-10-CM | POA: Diagnosis not present

## 2018-07-22 DIAGNOSIS — I69359 Hemiplegia and hemiparesis following cerebral infarction affecting unspecified side: Secondary | ICD-10-CM | POA: Diagnosis not present

## 2018-07-22 DIAGNOSIS — F322 Major depressive disorder, single episode, severe without psychotic features: Secondary | ICD-10-CM | POA: Diagnosis not present

## 2018-07-22 DIAGNOSIS — I82401 Acute embolism and thrombosis of unspecified deep veins of right lower extremity: Secondary | ICD-10-CM | POA: Diagnosis not present

## 2018-07-26 DIAGNOSIS — F331 Major depressive disorder, recurrent, moderate: Secondary | ICD-10-CM | POA: Diagnosis not present

## 2018-07-26 DIAGNOSIS — R278 Other lack of coordination: Secondary | ICD-10-CM | POA: Diagnosis not present

## 2018-07-26 DIAGNOSIS — E782 Mixed hyperlipidemia: Secondary | ICD-10-CM | POA: Diagnosis not present

## 2018-07-26 DIAGNOSIS — I69852 Hemiplegia and hemiparesis following other cerebrovascular disease affecting left dominant side: Secondary | ICD-10-CM | POA: Diagnosis not present

## 2018-07-27 DIAGNOSIS — I69852 Hemiplegia and hemiparesis following other cerebrovascular disease affecting left dominant side: Secondary | ICD-10-CM | POA: Diagnosis not present

## 2018-07-27 DIAGNOSIS — E782 Mixed hyperlipidemia: Secondary | ICD-10-CM | POA: Diagnosis not present

## 2018-07-27 DIAGNOSIS — R278 Other lack of coordination: Secondary | ICD-10-CM | POA: Diagnosis not present

## 2018-07-27 DIAGNOSIS — F331 Major depressive disorder, recurrent, moderate: Secondary | ICD-10-CM | POA: Diagnosis not present

## 2018-07-28 DIAGNOSIS — E782 Mixed hyperlipidemia: Secondary | ICD-10-CM | POA: Diagnosis not present

## 2018-07-28 DIAGNOSIS — R278 Other lack of coordination: Secondary | ICD-10-CM | POA: Diagnosis not present

## 2018-07-28 DIAGNOSIS — I69852 Hemiplegia and hemiparesis following other cerebrovascular disease affecting left dominant side: Secondary | ICD-10-CM | POA: Diagnosis not present

## 2018-07-28 DIAGNOSIS — F331 Major depressive disorder, recurrent, moderate: Secondary | ICD-10-CM | POA: Diagnosis not present

## 2018-07-29 DIAGNOSIS — F331 Major depressive disorder, recurrent, moderate: Secondary | ICD-10-CM | POA: Diagnosis not present

## 2018-07-29 DIAGNOSIS — R278 Other lack of coordination: Secondary | ICD-10-CM | POA: Diagnosis not present

## 2018-07-29 DIAGNOSIS — E782 Mixed hyperlipidemia: Secondary | ICD-10-CM | POA: Diagnosis not present

## 2018-07-29 DIAGNOSIS — G4700 Insomnia, unspecified: Secondary | ICD-10-CM | POA: Diagnosis not present

## 2018-07-29 DIAGNOSIS — I69852 Hemiplegia and hemiparesis following other cerebrovascular disease affecting left dominant side: Secondary | ICD-10-CM | POA: Diagnosis not present

## 2018-07-30 DIAGNOSIS — R278 Other lack of coordination: Secondary | ICD-10-CM | POA: Diagnosis not present

## 2018-07-30 DIAGNOSIS — F331 Major depressive disorder, recurrent, moderate: Secondary | ICD-10-CM | POA: Diagnosis not present

## 2018-07-30 DIAGNOSIS — E782 Mixed hyperlipidemia: Secondary | ICD-10-CM | POA: Diagnosis not present

## 2018-07-30 DIAGNOSIS — I69852 Hemiplegia and hemiparesis following other cerebrovascular disease affecting left dominant side: Secondary | ICD-10-CM | POA: Diagnosis not present

## 2018-08-02 ENCOUNTER — Encounter (HOSPITAL_COMMUNITY): Payer: Self-pay

## 2018-08-02 ENCOUNTER — Inpatient Hospital Stay (HOSPITAL_COMMUNITY)
Admission: EM | Admit: 2018-08-02 | Discharge: 2018-08-06 | DRG: 291 | Disposition: A | Discharge status: Skilled Nursing Facility | Payer: Medicare Other | Source: Skilled Nursing Facility | Attending: Internal Medicine | Admitting: Internal Medicine

## 2018-08-02 ENCOUNTER — Inpatient Hospital Stay (HOSPITAL_COMMUNITY): Payer: Medicare Other

## 2018-08-02 ENCOUNTER — Other Ambulatory Visit: Payer: Self-pay

## 2018-08-02 DIAGNOSIS — J810 Acute pulmonary edema: Secondary | ICD-10-CM | POA: Diagnosis not present

## 2018-08-02 DIAGNOSIS — D6862 Lupus anticoagulant syndrome: Secondary | ICD-10-CM | POA: Diagnosis present

## 2018-08-02 DIAGNOSIS — G609 Hereditary and idiopathic neuropathy, unspecified: Secondary | ICD-10-CM | POA: Diagnosis not present

## 2018-08-02 DIAGNOSIS — E785 Hyperlipidemia, unspecified: Secondary | ICD-10-CM | POA: Diagnosis present

## 2018-08-02 DIAGNOSIS — K08409 Partial loss of teeth, unspecified cause, unspecified class: Secondary | ICD-10-CM | POA: Diagnosis present

## 2018-08-02 DIAGNOSIS — Z993 Dependence on wheelchair: Secondary | ICD-10-CM | POA: Diagnosis not present

## 2018-08-02 DIAGNOSIS — E08319 Diabetes mellitus due to underlying condition with unspecified diabetic retinopathy without macular edema: Secondary | ICD-10-CM | POA: Diagnosis not present

## 2018-08-02 DIAGNOSIS — Z6841 Body Mass Index (BMI) 40.0 and over, adult: Secondary | ICD-10-CM | POA: Diagnosis not present

## 2018-08-02 DIAGNOSIS — J9601 Acute respiratory failure with hypoxia: Secondary | ICD-10-CM | POA: Diagnosis not present

## 2018-08-02 DIAGNOSIS — Z86711 Personal history of pulmonary embolism: Secondary | ICD-10-CM | POA: Diagnosis not present

## 2018-08-02 DIAGNOSIS — J90 Pleural effusion, not elsewhere classified: Secondary | ICD-10-CM | POA: Diagnosis not present

## 2018-08-02 DIAGNOSIS — F329 Major depressive disorder, single episode, unspecified: Secondary | ICD-10-CM | POA: Diagnosis present

## 2018-08-02 DIAGNOSIS — I639 Cerebral infarction, unspecified: Secondary | ICD-10-CM | POA: Diagnosis present

## 2018-08-02 DIAGNOSIS — Q211 Atrial septal defect: Secondary | ICD-10-CM

## 2018-08-02 DIAGNOSIS — D688 Other specified coagulation defects: Secondary | ICD-10-CM | POA: Diagnosis not present

## 2018-08-02 DIAGNOSIS — I69852 Hemiplegia and hemiparesis following other cerebrovascular disease affecting left dominant side: Secondary | ICD-10-CM | POA: Diagnosis not present

## 2018-08-02 DIAGNOSIS — I82409 Acute embolism and thrombosis of unspecified deep veins of unspecified lower extremity: Secondary | ICD-10-CM | POA: Diagnosis present

## 2018-08-02 DIAGNOSIS — I517 Cardiomegaly: Secondary | ICD-10-CM | POA: Diagnosis not present

## 2018-08-02 DIAGNOSIS — G9341 Metabolic encephalopathy: Secondary | ICD-10-CM | POA: Diagnosis present

## 2018-08-02 DIAGNOSIS — R531 Weakness: Secondary | ICD-10-CM | POA: Diagnosis not present

## 2018-08-02 DIAGNOSIS — R404 Transient alteration of awareness: Secondary | ICD-10-CM | POA: Diagnosis not present

## 2018-08-02 DIAGNOSIS — I4891 Unspecified atrial fibrillation: Secondary | ICD-10-CM

## 2018-08-02 DIAGNOSIS — R1311 Dysphagia, oral phase: Secondary | ICD-10-CM | POA: Diagnosis not present

## 2018-08-02 DIAGNOSIS — I10 Essential (primary) hypertension: Secondary | ICD-10-CM | POA: Diagnosis not present

## 2018-08-02 DIAGNOSIS — J9622 Acute and chronic respiratory failure with hypercapnia: Secondary | ICD-10-CM | POA: Diagnosis present

## 2018-08-02 DIAGNOSIS — R0902 Hypoxemia: Secondary | ICD-10-CM | POA: Diagnosis not present

## 2018-08-02 DIAGNOSIS — Z7901 Long term (current) use of anticoagulants: Secondary | ICD-10-CM | POA: Diagnosis not present

## 2018-08-02 DIAGNOSIS — Z95828 Presence of other vascular implants and grafts: Secondary | ICD-10-CM

## 2018-08-02 DIAGNOSIS — Z86718 Personal history of other venous thrombosis and embolism: Secondary | ICD-10-CM

## 2018-08-02 DIAGNOSIS — G40909 Epilepsy, unspecified, not intractable, without status epilepticus: Secondary | ICD-10-CM | POA: Diagnosis present

## 2018-08-02 DIAGNOSIS — Z79899 Other long term (current) drug therapy: Secondary | ICD-10-CM | POA: Diagnosis not present

## 2018-08-02 DIAGNOSIS — R569 Unspecified convulsions: Secondary | ICD-10-CM | POA: Diagnosis not present

## 2018-08-02 DIAGNOSIS — Z888 Allergy status to other drugs, medicaments and biological substances status: Secondary | ICD-10-CM

## 2018-08-02 DIAGNOSIS — Z66 Do not resuscitate: Secondary | ICD-10-CM | POA: Diagnosis present

## 2018-08-02 DIAGNOSIS — I5033 Acute on chronic diastolic (congestive) heart failure: Secondary | ICD-10-CM | POA: Diagnosis present

## 2018-08-02 DIAGNOSIS — I480 Paroxysmal atrial fibrillation: Secondary | ICD-10-CM | POA: Diagnosis present

## 2018-08-02 DIAGNOSIS — I509 Heart failure, unspecified: Secondary | ICD-10-CM

## 2018-08-02 DIAGNOSIS — J9690 Respiratory failure, unspecified, unspecified whether with hypoxia or hypercapnia: Secondary | ICD-10-CM | POA: Diagnosis present

## 2018-08-02 DIAGNOSIS — J9621 Acute and chronic respiratory failure with hypoxia: Secondary | ICD-10-CM | POA: Diagnosis present

## 2018-08-02 DIAGNOSIS — N289 Disorder of kidney and ureter, unspecified: Secondary | ICD-10-CM | POA: Diagnosis not present

## 2018-08-02 DIAGNOSIS — N390 Urinary tract infection, site not specified: Secondary | ICD-10-CM | POA: Diagnosis not present

## 2018-08-02 DIAGNOSIS — M3210 Systemic lupus erythematosus, organ or system involvement unspecified: Secondary | ICD-10-CM | POA: Diagnosis not present

## 2018-08-02 DIAGNOSIS — I6990 Unspecified sequelae of unspecified cerebrovascular disease: Secondary | ICD-10-CM | POA: Diagnosis not present

## 2018-08-02 DIAGNOSIS — F411 Generalized anxiety disorder: Secondary | ICD-10-CM | POA: Diagnosis not present

## 2018-08-02 DIAGNOSIS — Z87891 Personal history of nicotine dependence: Secondary | ICD-10-CM

## 2018-08-02 DIAGNOSIS — I69351 Hemiplegia and hemiparesis following cerebral infarction affecting right dominant side: Secondary | ICD-10-CM

## 2018-08-02 DIAGNOSIS — R0602 Shortness of breath: Secondary | ICD-10-CM | POA: Diagnosis not present

## 2018-08-02 DIAGNOSIS — G4089 Other seizures: Secondary | ICD-10-CM | POA: Diagnosis not present

## 2018-08-02 DIAGNOSIS — I110 Hypertensive heart disease with heart failure: Principal | ICD-10-CM | POA: Diagnosis present

## 2018-08-02 DIAGNOSIS — Z79890 Hormone replacement therapy (postmenopausal): Secondary | ICD-10-CM | POA: Diagnosis not present

## 2018-08-02 DIAGNOSIS — J849 Interstitial pulmonary disease, unspecified: Secondary | ICD-10-CM | POA: Diagnosis not present

## 2018-08-02 DIAGNOSIS — D6489 Other specified anemias: Secondary | ICD-10-CM | POA: Diagnosis not present

## 2018-08-02 DIAGNOSIS — F331 Major depressive disorder, recurrent, moderate: Secondary | ICD-10-CM | POA: Diagnosis not present

## 2018-08-02 DIAGNOSIS — J9602 Acute respiratory failure with hypercapnia: Secondary | ICD-10-CM | POA: Diagnosis not present

## 2018-08-02 DIAGNOSIS — G4700 Insomnia, unspecified: Secondary | ICD-10-CM | POA: Diagnosis not present

## 2018-08-02 DIAGNOSIS — R001 Bradycardia, unspecified: Secondary | ICD-10-CM | POA: Diagnosis not present

## 2018-08-02 DIAGNOSIS — R4182 Altered mental status, unspecified: Secondary | ICD-10-CM | POA: Diagnosis not present

## 2018-08-02 DIAGNOSIS — I2782 Chronic pulmonary embolism: Secondary | ICD-10-CM

## 2018-08-02 DIAGNOSIS — I4821 Permanent atrial fibrillation: Secondary | ICD-10-CM

## 2018-08-02 DIAGNOSIS — E782 Mixed hyperlipidemia: Secondary | ICD-10-CM | POA: Diagnosis not present

## 2018-08-02 DIAGNOSIS — R0600 Dyspnea, unspecified: Secondary | ICD-10-CM

## 2018-08-02 DIAGNOSIS — I69952 Hemiplegia and hemiparesis following unspecified cerebrovascular disease affecting left dominant side: Secondary | ICD-10-CM | POA: Diagnosis not present

## 2018-08-02 DIAGNOSIS — I351 Nonrheumatic aortic (valve) insufficiency: Secondary | ICD-10-CM | POA: Diagnosis not present

## 2018-08-02 HISTORY — DX: Heart failure, unspecified: I50.9

## 2018-08-02 LAB — CBC WITH DIFFERENTIAL/PLATELET
ABS IMMATURE GRANULOCYTES: 0.1 10*3/uL — AB (ref 0.00–0.07)
Basophils Absolute: 0.1 10*3/uL (ref 0.0–0.1)
Basophils Relative: 1 %
Eosinophils Absolute: 0.2 10*3/uL (ref 0.0–0.5)
Eosinophils Relative: 2 %
HCT: 43.4 % (ref 39.0–52.0)
Hemoglobin: 13.2 g/dL (ref 13.0–17.0)
Immature Granulocytes: 1 %
LYMPHS PCT: 12 %
Lymphs Abs: 1 10*3/uL (ref 0.7–4.0)
MCH: 29.9 pg (ref 26.0–34.0)
MCHC: 30.4 g/dL (ref 30.0–36.0)
MCV: 98.4 fL (ref 80.0–100.0)
MONO ABS: 0.7 10*3/uL (ref 0.1–1.0)
MONOS PCT: 8 %
NEUTROS ABS: 6.6 10*3/uL (ref 1.7–7.7)
Neutrophils Relative %: 76 %
Platelets: 236 10*3/uL (ref 150–400)
RBC: 4.41 MIL/uL (ref 4.22–5.81)
RDW: 13.8 % (ref 11.5–15.5)
WBC: 8.7 10*3/uL (ref 4.0–10.5)
nRBC: 0 % (ref 0.0–0.2)

## 2018-08-02 LAB — BLOOD GAS, ARTERIAL
Acid-Base Excess: 7.6 mmol/L — ABNORMAL HIGH (ref 0.0–2.0)
Bicarbonate: 35 mmol/L — ABNORMAL HIGH (ref 20.0–28.0)
Delivery systems: POSITIVE
Drawn by: 11249
Expiratory PAP: 5
FIO2: 30
Inspiratory PAP: 15
Mode: POSITIVE
O2 Saturation: 88.5 %
Patient temperature: 98.6
pCO2 arterial: 64.1 mmHg — ABNORMAL HIGH (ref 32.0–48.0)
pH, Arterial: 7.356 (ref 7.350–7.450)
pO2, Arterial: 59.4 mmHg — ABNORMAL LOW (ref 83.0–108.0)

## 2018-08-02 LAB — BLOOD GAS, VENOUS
Acid-Base Excess: 4.5 mmol/L — ABNORMAL HIGH (ref 0.0–2.0)
Acid-Base Excess: 5.1 mmol/L — ABNORMAL HIGH (ref 0.0–2.0)
Bicarbonate: 33.8 mmol/L — ABNORMAL HIGH (ref 20.0–28.0)
Bicarbonate: 33.7 mmol/L — ABNORMAL HIGH (ref 20.0–28.0)
FIO2: 21
O2 Saturation: 64.9 %
O2 Saturation: 36.5 %
PATIENT TEMPERATURE: 98.6
PCO2 VEN: 76.6 mmHg — AB (ref 44.0–60.0)
PO2 VEN: 39.4 mmHg (ref 32.0–45.0)
Patient temperature: 98.6
pCO2, Ven: 71.9 mmHg (ref 44.0–60.0)
pH, Ven: 7.268 (ref 7.250–7.430)
pH, Ven: 7.292 (ref 7.250–7.430)

## 2018-08-02 LAB — LACTIC ACID, PLASMA: Lactic Acid, Venous: 1 mmol/L (ref 0.5–1.9)

## 2018-08-02 LAB — COMPREHENSIVE METABOLIC PANEL
ALT: 19 U/L (ref 0–44)
AST: 20 U/L (ref 15–41)
Albumin: 3.6 g/dL (ref 3.5–5.0)
Alkaline Phosphatase: 112 U/L (ref 38–126)
Anion gap: 6 (ref 5–15)
BUN: 12 mg/dL (ref 6–20)
CHLORIDE: 102 mmol/L (ref 98–111)
CO2: 31 mmol/L (ref 22–32)
CREATININE: 0.64 mg/dL (ref 0.61–1.24)
Calcium: 8.6 mg/dL — ABNORMAL LOW (ref 8.9–10.3)
GFR calc non Af Amer: >60 mL/min (ref >60)
GLUCOSE: 105 mg/dL — AB (ref 70–99)
Potassium: 4.7 mmol/L (ref 3.5–5.1)
SODIUM: 139 mmol/L (ref 135–145)
Total Bilirubin: 0.4 mg/dL (ref 0.3–1.2)
Total Protein: 7 g/dL (ref 6.5–8.1)

## 2018-08-02 LAB — PROTIME-INR
INR: 3
Prothrombin Time: 30.6 seconds — ABNORMAL HIGH (ref 11.4–15.2)

## 2018-08-02 LAB — BRAIN NATRIURETIC PEPTIDE: B NATRIURETIC PEPTIDE 5: 234.9 pg/mL — AB (ref 0.0–100.0)

## 2018-08-02 LAB — TROPONIN I: Troponin I: <0.03 ng/mL (ref <0.03)

## 2018-08-02 LAB — MRSA PCR SCREENING: MRSA by PCR: NEGATIVE

## 2018-08-02 MED ORDER — EZETIMIBE 10 MG PO TABS
10.0000 mg | ORAL_TABLET | Freq: Every day | ORAL | Status: DC
  Administered 2018-08-03 – 2018-08-06 (×4): 10 mg via ORAL
  Filled 2018-08-02 (×5): qty 1

## 2018-08-02 MED ORDER — ACETAMINOPHEN 650 MG RE SUPP: 650.0000 mg | Freq: Four times a day (QID) | RECTAL | Status: DC | PRN

## 2018-08-02 MED ORDER — FUROSEMIDE 20 MG PO TABS: 20.0000 mg | ORAL_TABLET | Freq: Every day | ORAL | Status: DC

## 2018-08-02 MED ORDER — PANTOPRAZOLE SODIUM 40 MG PO TBEC
40.0000 mg | DELAYED_RELEASE_TABLET | Freq: Every day | ORAL | Status: DC
  Administered 2018-08-03 – 2018-08-06 (×4): 40 mg via ORAL
  Filled 2018-08-02 (×4): qty 1

## 2018-08-02 MED ORDER — WARFARIN SODIUM 4 MG PO TABS
4.5000 mg | ORAL_TABLET | Freq: Every day | ORAL | Status: DC
  Administered 2018-08-03 – 2018-08-04 (×2): 4.5 mg via ORAL
  Filled 2018-08-02 (×4): qty 1

## 2018-08-02 MED ORDER — ONDANSETRON HCL 4 MG PO TABS: 4.0000 mg | ORAL_TABLET | Freq: Four times a day (QID) | ORAL | Status: DC | PRN

## 2018-08-02 MED ORDER — WARFARIN - PHARMACIST DOSING INPATIENT
Freq: Every day | Status: DC
  Administered 2018-08-03: 17:00:00

## 2018-08-02 MED ORDER — MELATONIN 3 MG PO TABS
3.0000 mg | ORAL_TABLET | Freq: Every day | ORAL | Status: DC
  Filled 2018-08-02: qty 1

## 2018-08-02 MED ORDER — BACLOFEN 10 MG PO TABS
20.0000 mg | ORAL_TABLET | Freq: Three times a day (TID) | ORAL | Status: DC
  Administered 2018-08-03: 20 mg via ORAL
  Filled 2018-08-02: qty 2

## 2018-08-02 MED ORDER — PHENYTOIN SODIUM EXTENDED 100 MG PO CAPS
200.0000 mg | ORAL_CAPSULE | Freq: Two times a day (BID) | ORAL | Status: DC
  Administered 2018-08-03 – 2018-08-06 (×7): 200 mg via ORAL
  Filled 2018-08-02 (×7): qty 2

## 2018-08-02 MED ORDER — TRAZODONE HCL 50 MG PO TABS: 50.0000 mg | ORAL_TABLET | Freq: Every day | ORAL | Status: DC

## 2018-08-02 MED ORDER — CYCLOSPORINE 0.05 % OP EMUL
1.0000 [drp] | Freq: Two times a day (BID) | OPHTHALMIC | Status: DC
  Administered 2018-08-02 – 2018-08-06 (×8): 1 [drp] via OPHTHALMIC
  Filled 2018-08-02 (×10): qty 30

## 2018-08-02 MED ORDER — OMEGA-3-ACID ETHYL ESTERS 1 G PO CAPS
1.0000 g | ORAL_CAPSULE | Freq: Two times a day (BID) | ORAL | Status: DC
  Administered 2018-08-03 – 2018-08-06 (×7): 1 g via ORAL
  Filled 2018-08-02 (×9): qty 1

## 2018-08-02 MED ORDER — ROSUVASTATIN CALCIUM 20 MG PO TABS
40.0000 mg | ORAL_TABLET | Freq: Every day | ORAL | Status: DC
  Administered 2018-08-03 – 2018-08-05 (×3): 40 mg via ORAL
  Filled 2018-08-02 (×4): qty 2

## 2018-08-02 MED ORDER — METOPROLOL TARTRATE 25 MG PO TABS
25.0000 mg | ORAL_TABLET | Freq: Two times a day (BID) | ORAL | Status: DC
  Administered 2018-08-03 – 2018-08-06 (×7): 25 mg via ORAL
  Filled 2018-08-02 (×7): qty 1

## 2018-08-02 MED ORDER — VENLAFAXINE HCL 75 MG PO TABS
75.0000 mg | ORAL_TABLET | Freq: Every day | ORAL | Status: DC
  Filled 2018-08-02: qty 1

## 2018-08-02 MED ORDER — ONDANSETRON HCL 4 MG/2ML IJ SOLN: 4.0000 mg | Freq: Four times a day (QID) | INTRAMUSCULAR | Status: DC | PRN

## 2018-08-02 MED ORDER — FUROSEMIDE 10 MG/ML IJ SOLN
80.0000 mg | Freq: Once | INTRAMUSCULAR | Status: AC
  Administered 2018-08-02: 80 mg via INTRAVENOUS
  Filled 2018-08-02: qty 8

## 2018-08-02 MED ORDER — OMEGA-3 FATTY ACIDS 1000 MG PO CAPS
1.0000 g | ORAL_CAPSULE | Freq: Two times a day (BID) | ORAL | Status: DC
  Filled 2018-08-02: qty 1

## 2018-08-02 MED ORDER — ACETAMINOPHEN 325 MG PO TABS: 650.0000 mg | ORAL_TABLET | Freq: Four times a day (QID) | ORAL | Status: DC | PRN

## 2018-08-02 MED ORDER — GABAPENTIN 300 MG PO CAPS
300.0000 mg | ORAL_CAPSULE | Freq: Three times a day (TID) | ORAL | Status: DC
  Administered 2018-08-03: 300 mg via ORAL
  Filled 2018-08-02: qty 1

## 2018-08-02 NOTE — ED Notes (Signed)
Alerted by provider that pt needs to be placed back on bi-pap. Pt no longer following commands, A&O 2X, and repeating the same sentence. Respiratory alerted, and pt to be placed back on bi-pap.

## 2018-08-02 NOTE — ED Notes (Signed)
Respiratory notified of VBG.  

## 2018-08-02 NOTE — ED Notes (Signed)
Pt responding well, A&O 4x, and able to follow commands. Due to pts size there is difficulty keeping mask on pt. Will trail pt off bi-pap, and see if pt can ween on to nasal cannula.

## 2018-08-02 NOTE — ED Notes (Signed)
Pt POA Frederik Hartsock, Sister) [phone] called and wanted to be updated as pts care progresses.

## 2018-08-02 NOTE — Progress Notes (Signed)
Pt transported from ED to ICU 1230 on bipap.  Pt tolerated transport well without incident. 

## 2018-08-02 NOTE — ED Triage Notes (Signed)
Pt arrives via [facility] from [facility] Per EMS: Pt was hypoxic at facility. Facility reports sat of 79% on baseline 2L o2. Facility placed pt on 4L o2. EMS reports pt was 93% on 4L o2. Pt more alert per EMS. Pt has hx of CVA

## 2018-08-02 NOTE — Progress Notes (Signed)
Critical CO2 (76.6) given to Dr. Charm Barges. No new orders. RT will continue to monitor patient.

## 2018-08-02 NOTE — H&P (Signed)
History and Physical    James Villegas:[id number] DOB: Dec 08, 1955 DOA: 08/02/2018  PCP: [name], MD   Patient coming from: Assisted living facility.   Chief Complaint: Somnolence.   HPI: James Villegas is a 63 y.o. 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    Patient was sent from the assisted living facility due to somnolence, moderate in intensity, associated with altered mentation and rapid breathing, transiently improved with supplemental oxygen per nasal cannula, no worsening factors.  On direct questioning patient does not recall the events that led him to the emergency room visit, but he did report having headaches for the last 3 days, associated with dyspnea on exertion.  He uses a wheelchair.   ED Course: Patient was found in acute hypercapnic respiratory failure, he was placed on noninvasive mechanical ventilation and received IV furosemide with improvement of his symptoms, patient has been admitted for further noninvasive mechanical ventilation.  Review of Systems:  1. General: No fevers, no chills, no weight gain or weight loss 2. ENT: No runny nose or sore throat, no hearing disturbances 3. Pulmonary: positive dyspnea, worse with exertion, no cough, wheezing, or hemoptysis 4. Cardiovascular: No angina, claudication, positive worsening right sided lower extremity edema, no pnd or orthopnea 5. Gastrointestinal: No nausea or vomiting, no diarrhea or constipation 6. Hematology: No easy bruisability or frequent infections 7. Urology: No dysuria, hematuria or increased urinary frequency 8. Dermatology: No rashes. 9. Neurology: No seizures or paresthesias. Positive  somnolence and headaches. Positive right sided hemiparesis.  10. Musculoskeletal: No joint pain or deformities  Past Medical History:  Diagnosis Date  . Anemia, secondary    SECONDARY TO ACUTE BLOOD LOSS  . Anxiety disorder   . Bloody stool 08/29/2011   intermittent along with constipation.   . 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 Depression   . 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 Dysphagia   . Hyperlipidemia   . Hypertension   . Lupus anticoagulant disorder (HCC) 1990  . Morbid obesity (HCC)   . PFO (patent foramen ovale)    TEE 2/10: EF 60%, trivial AI, mild Ao root dilatation, mod PFO with R-L shunting, atrial septal aneurysm;   echo 7/12: EF 65-70%, grade 1 diast dysfxn, mild MR, LVOT showed severe obstruction  . Protein C deficiency (HCC)   . Protein S deficiency (HCC)   . Rectus sheath hematoma 7/12   required reversal of anticoagulation and c/b DVT req. IVC filter  . Seizure disorder [facility])     Past Surgical History:  Procedure Laterality Date  . dental extraction     multiple  . vena cavogram  12/2010   INFERIOR     reports that he has quit smoking. He has never used smokeless tobacco. He reports that he does not drink alcohol or use drugs.  Allergies  Allergen Reactions  . Fenofibrate Other (See Comments)    Per MAR    History reviewed. No pertinent family history.   Prior to Admission medications   Medication Sig Start Date End Date Taking? Authorizing Provider  baclofen (LIORESAL) 20 MG tablet Take 20 mg by  mouth 3 (three) times daily.     Yes [provider]  Carboxymeth-Glycerin-Polysorb (REFRESH OPTIVE ADVANCED OP) Place 1 drop into both eyes 4 (four) times daily. Wait 3-5 minutes between eye meds   Yes [provider]  ezetimibe (ZETIA) 10 MG tablet Take 10 mg by mouth daily.   Yes [provider]  fish  oil-omega-3 fatty acids 1000 MG capsule Take 1 g by mouth 2 (two) times daily.    Yes [provider]  furosemide (LASIX) 20 MG tablet Take 20 mg by mouth daily. 07/27/18  Yes [provider]  gabapentin (NEURONTIN) 300 MG capsule Take 300 mg by mouth 3 (three) times daily.     Yes [provider]  Melatonin 3 MG TABS Take 3 mg by mouth at bedtime.   Yes [provider]  metoprolol tartrate (LOPRESSOR) 25 MG tablet Take 25 mg by mouth 2 (two) times daily.    Yes [provider]  omeprazole (PRILOSEC) 20 MG capsule Take 20 mg by mouth at bedtime.    Yes [provider]  phenytoin (DILANTIN) 100 MG ER capsule Take 200 mg by mouth 2 (two) times daily.   Yes [provider]  RESTASIS 0.05 % ophthalmic emulsion Place 1 drop into both eyes 2 (two) times daily. 06/03/18  Yes [provider]  rosuvastatin (CRESTOR) 40 MG tablet Take 40 mg by mouth at bedtime.    Yes [provider]  Skin Protectants, Misc. (EUCERIN) cream Apply 1 application topically daily. Apply to areas of dry skin once daily   Yes [provider]  traZODone (DESYREL) 50 MG tablet Take 50 mg by mouth at bedtime. 07/08/18  Yes [provider]  venlafaxine (EFFEXOR) 75 MG tablet Take 75 mg by mouth daily. 07/15/18  Yes [provider]  warfarin (COUMADIN) 2 MG tablet Take 2 mg by mouth See admin instructions. Take with 2.5 mg to equal 4.5 mg daily   Yes [provider]  warfarin (COUMADIN) 2.5 MG tablet Take 2.5 mg by mouth See admin instructions. Take with 2.0 mg daily to equal 4.5 mg daily   Yes [provider]  HYDROcodone-acetaminophen (NORCO/VICODIN) 5-325 MG per tablet Take one tablet by mouth every 12 hours as needed for pain Patient not taking: Reported on 08/02/2018 07/07/13   Kermit Balo, DO    Physical Exam: Vitals:   08/02/18 1900 08/02/18 1933 08/02/18 1934 08/02/18 2006  BP: 135/85 129/89 129/89 (!)  147/95  Pulse: 85 80 76 87  Resp: Temp:      TempSrc:      SpO2: 94% 92% 94% 91%  Weight:      Height:        Vitals:   08/02/18 1900 08/02/18 1933 08/02/18 1934 08/02/18 2006  BP: 135/85 129/89 129/89 (!) 147/95  Pulse: 85 80 76 87  Resp: Temp:      TempSrc:      SpO2: 94% 92% 94% 91%  Weight:      Height:       General: deconditioned and ill looking appearing. On full face mask bipap.  Neurology: Awake and alert, non focal. Slow to respond to questions, positive right sided hemiparesis.  Head and Neck. Head normocephalic. Neck supple with no adenopathy or thyromegaly.   E ENT: no pallor, no icterus, oral mucosa moist Cardiovascular: No JVD. S1-S2 present, irregular, no gallops, rubs, or murmurs. +++/++++ bilateral lower extremity  edema, non pitting, more right than left. Pulmonary: decreased breath sounds bilaterally, poor air movement, no wheezing, rhonchi or rales. Poor inspiratory effort.  Gastrointestinal. Abdomen protuberant with no organomegaly, non tender, no rebound or guarding Skin. No rashes Musculoskeletal: no joint deformities    Labs on Admission: I have personally reviewed following labs and imaging studies  CBC: Recent Labs  Lab 08/02/18 1451  WBC 8.7  NEUTROABS 6.6  HGB 13.2  HCT 43.4  MCV 98.4  PLT 236   Basic Metabolic Panel: Recent Labs  Lab 08/02/18 1451  NA 139  K 4.7  CL 102  CO2 31  GLUCOSE 105*  BUN 12  CREATININE 0.64  CALCIUM 8.6*   GFR: Estimated Creatinine Clearance: 151.3 mL/min (by C-G formula based on SCr of 0.64 mg/dL). Liver Function Tests: Recent Labs  Lab 08/02/18 1451  AST 20  ALT 19  ALKPHOS 112  BILITOT 0.4  PROT 7.0  ALBUMIN 3.6   No results for input(s): LIPASE, AMYLASE in the last 168 hours. No results for input(s): AMMONIA in the last 168 hours. Coagulation Profile: Recent Labs  Lab 08/02/18 1451  INR 3.0   Cardiac Enzymes: Recent Labs  Lab 08/02/18 1451  TROPONINI  <0.03   BNP (last 3 results) No results for input(s): PROBNP in the last 8760 hours. HbA1C: No results for input(s): HGBA1C in the last 72 hours. CBG: No results for input(s): GLUCAP in the last 168 hours. Lipid Profile: No results for input(s): CHOL, HDL, LDLCALC, TRIG, CHOLHDL, LDLDIRECT in the last 72 hours. Thyroid Function Tests: No results for input(s): TSH, T4TOTAL, FREET4, T3FREE, THYROIDAB in the last 72 hours. Anemia Panel: No results for input(s): VITAMINB12, FOLATE, FERRITIN, TIBC, IRON, RETICCTPCT in the last 72 hours. Urine analysis:    Component Value Date/Time   COLORURINE AMBER (A) 12/28/2010 1809   APPEARANCEUR CLOUDY (A) 12/28/2010 1809   LABSPEC 1.021 12/28/2010 1809   PHURINE 5.5 12/28/2010 1809   GLUCOSEU NEGATIVE 12/28/2010 1809   HGBUR NEGATIVE 12/28/2010 1809   BILIRUBINUR SMALL (A) 12/28/2010 1809   KETONESUR NEGATIVE 12/28/2010 1809   PROTEINUR NEGATIVE 12/28/2010 1809   UROBILINOGEN 1.0 12/28/2010 1809   NITRITE NEGATIVE 12/28/2010 1809   LEUKOCYTESUR TRACE (A) 12/28/2010 1809    Radiological Exams on Admission: No results found.  EKG: Independently reviewed.  Rate of 70 bpm, normal axis, intraventricular conduction delay, atrial flutter with variable block.  Assessment/Plan Active Problems:   Respiratory failure [facility])  63 year old male with multiple medical problems including old CVA with right hemiparesis, morbid obesity, deep vein thrombosis and pulmonary embolism who presents with headaches, somnolence and dyspnea.  On his initial physical examination blood pressure 122/70, heart rate 78, respiratory rate 18, oxygen saturation 93%.  He is slow to respond to questions but awake and alert, he has poor inspiratory effort with poor ventilation, heart S1-S2 present, irregular, abdomen protuberant, significant lower extremity edema, right greater than left.  Sodium 139, potassium 4.7, chloride 102, bicarb 31, glucose 105, BUN 12, creatinine 0.64, BNP  234, troponin less than 0.03.  Venous blood gas with a pH of 7.26, 7.29, white count 8.7, hemoglobin 13.2, hematocrit 43.4, platelets 236.   Patient will be admitted to the stepdown unit with the working diagnosis of acute hypercapnic respiratory failure.  1.  Acute hypercapnic respiratory failure.  Patient will be admitted to the stepdown unit, will continue noninvasive mechanical ventilation with BiPAP 12/5. Patient will need an arterial blood gas analysis since the initial gas analysis were  venous.  Further work-up with chest radiograph.  Continue oximetry monitoring, target O2 saturation greater 92%.  2.  New onset atrial flutter.  His rhythm is irregular, noticeable F waves, will continue anticoagulation with warfarin.  Continue telemetry monitoring.  Continue metoprolol for AV blockade.  3.  History of DVT and pulmonary embolism.  Continue anticoagulation with warfarin per pharmacy protocol.  4.  Hypertension.  Will resume furosemide 20 mg daily, along with metoprolol.  5.  Depression.  Continue trazodone and venlafaxine.  No current agitation.  6.  History of CVA with right hemiparesis, wheelchair-bound/complicated with seizures.  Physical therapy evaluation, continue phenytoin.  7.  Morbid obesity with dyslipidemia.  Patient is wheelchair-bound, will need close follow-up as an outpatient.  Continue statin therapy.  DVT prophylaxis: warfarin  Code Status: full  Family Communication: no family at the bedside   Disposition Plan: Step Down unit.  Consults called: none   Admission status: Inpatient.    Theoplis Garciagarcia Annett Gulaaniel Neo Yepiz MD [facility]   08/02/2018, 8:54 PM

## 2018-08-02 NOTE — ED Notes (Signed)
ED TO INPATIENT HANDOFF REPORT  Name/Age/Gender James Villegas 63 y.o. male  Code Status Advance Directive Documentation     Most Recent Value  Type of Advance Directive  Out of facility DNR (pink MOST or yellow form)  Pre-existing out of facility DNR order (yellow form or pink MOST form)  -  "MOST" Form in Place?  -      Home/SNF/Other given to floor  Chief Complaint AMS  Level of Care/Admitting Diagnosis ED Disposition    ED Disposition Condition Comment   Admit  Hospital Area: [facility] [[id number]]  Level of Care: Stepdown [14]  Admit to SDU based on following criteria: Respiratory Distress:  Frequent assessment and/or intervention to maintain adequate ventilation/respiration, pulmonary toilet, and respiratory treatment.  Diagnosis: Respiratory failure [facility]) [id number]  Admitting Physician: Coralie Keens [[id number]]  Attending Physician: Coralie Keens [[id number]]  Estimated length of stay: 3 - 4 days  Certification:: I certify this patient will need inpatient services for at least 2 midnights  PT Class (Do Not Modify): Inpatient [101]  PT Acc Code (Do Not Modify): Private [1]       Medical History Past Medical History:  Diagnosis Date  . Anemia, secondary    SECONDARY TO ACUTE BLOOD LOSS  . Anxiety disorder   . Bloody stool 08/29/2011   intermittent along with constipation.   . 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 Depression   . 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 Dysphagia   . Hyperlipidemia   . Hypertension   . Lupus anticoagulant disorder (HCC) 1990  . Morbid obesity (HCC)   . PFO (patent foramen ovale)    TEE 2/10: EF 60%, trivial AI, mild Ao root dilatation, mod PFO with R-L shunting, atrial septal aneurysm;   echo 7/12: EF 65-70%, grade 1 diast dysfxn, mild MR, LVOT showed severe obstruction  . Protein C  deficiency (HCC)   . Protein S deficiency (HCC)   . Rectus sheath hematoma 7/12   required reversal of anticoagulation and c/b DVT req. IVC filter  . Seizure disorder (HCC)     Allergies Allergies  Allergen Reactions  . Fenofibrate Other (See Comments)    Per MAR    IV Location/Drains/Wounds Patient Lines/Drains/Airways Status   Active Line/Drains/Airways    Name:   Placement date:   Placement time:   Site:   Days:   Peripheral IV 08/02/18 Left Antecubital   08/02/18    -    Antecubital   less than 1   Urethral Catheter Rhoderick Moody, RN Latex;Straight-tip 14 Fr.   08/02/18    1702    Latex;Straight-tip   less than 1   External Urinary Catheter   08/02/18    1440    -   less than 1          Labs/Imaging Results for orders placed or performed during the hospital encounter of 08/02/18 (from the past 48 hour(s))  Comprehensive metabolic panel     Status: Abnormal   Collection Time: 08/02/18  2:51 PM  Result Value Ref Range   Sodium 139 135 - 145 mmol/L   Potassium 4.7 3.5 - 5.1 mmol/L   Chloride 102 98 - 111 mmol/L   CO2 31 22 - 32 mmol/L   Glucose, Bld 105 (H) 70 - 99 mg/dL   BUN 12 6 - 20  mg/dL   Creatinine, Ser 1.610.64 0.61 - 1.24 mg/dL   Calcium 8.6 (L) 8.9 - 10.3 mg/dL   Total Protein 7.0 6.5 - 8.1 g/dL   Albumin 3.6 3.5 - 5.0 g/dL   AST 20 15 - 41 U/L   ALT 19 0 - 44 U/L   Alkaline Phosphatase 112 38 - 126 U/L   Total Bilirubin 0.4 0.3 - 1.2 mg/dL   GFR calc non Af Amer >60 >60 mL/min   GFR calc Af Amer >60 >60 mL/min   Anion gap 6 5 - 15    Comment: Performed at [facility], [address] [place], [place] [address]  Troponin I - Once     Status: None   Collection Time: 08/02/18  2:51 PM  Result Value Ref Range   Troponin I <0.03 <0.03 ng/mL    Comment: Performed at [facility], [address] [place], [place] [address]  Brain natriuretic peptide     Status: Abnormal   Collection Time: 08/02/18  2:51 PM  Result Value Ref  Range   B Natriuretic Peptide 234.9 (H) 0.0 - 100.0 pg/mL    Comment: Performed at [facility], [address] [place], [place] [address]  CBC with Differential     Status: Abnormal   Collection Time: 08/02/18  2:51 PM  Result Value Ref Range   WBC 8.7 4.0 - 10.5 K/uL   RBC 4.41 4.22 - 5.81 MIL/uL   Hemoglobin 13.2 13.0 - 17.0 g/dL   HCT 78.243.4 95.639.0 - 21.352.0 %   MCV 98.4 80.0 - 100.0 fL   MCH 29.9 26.0 - 34.0 pg   MCHC 30.4 30.0 - 36.0 g/dL   RDW 08.613.8 57.811.5 - 46.915.5 %   Platelets 236 150 - 400 K/uL   nRBC 0.0 0.0 - 0.2 %   Neutrophils Relative % 76 %   Neutro Abs 6.6 1.7 - 7.7 K/uL   Lymphocytes Relative 12 %   Lymphs Abs 1.0 0.7 - 4.0 K/uL   Monocytes Relative 8 %   Monocytes Absolute 0.7 0.1 - 1.0 K/uL   Eosinophils Relative 2 %   Eosinophils Absolute 0.2 0.0 - 0.5 K/uL   Basophils Relative 1 %   Basophils Absolute 0.1 0.0 - 0.1 K/uL   Immature Granulocytes 1 %   Abs Immature Granulocytes 0.10 (H) 0.00 - 0.07 K/uL    Comment: Performed at [facility], [address] [place], [place] [address]  Protime-INR     Status: Abnormal   Collection Time: 08/02/18  2:51 PM  Result Value Ref Range   Prothrombin Time 30.6 (H) 11.4 - 15.2 seconds   INR 3.0     Comment: Performed at [facility], [address] [place], [place] [address]  Lactic acid, plasma     Status: None   Collection Time: 08/02/18  2:51 PM  Result Value Ref Range   Lactic Acid, Venous 1.0 0.5 - 1.9 mmol/L    Comment: Performed at [facility], [address] [place], [place] [address]  Blood gas, venous     Status: Abnormal   Collection Time: 08/02/18  2:52 PM  Result Value Ref Range   FIO2 21.00    Delivery systems ROOM AIR    pH, Ven 7.268 7.250 - 7.430   pCO2, Ven 76.6 (HH) 44.0 - 60.0 mmHg    Comment: RBV Meridee ScoreMICHAEL BUTLER, MD AT 1505 BY [facility] DEPUE,RRT,RCP ON 08/02/2018  pO2, Ven 39.4 32.0 - 45.0 mmHg   Bicarbonate 33.8 (H) 20.0 -  28.0 mmol/L   Acid-Base Excess 4.5 (H) 0.0 - 2.0 mmol/L   O2 Saturation 64.9 %   Patient temperature 98.6    Collection site VEIN    Drawn by RN    Sample type VEIN     Comment: Performed at [facility], [address] [place], [place] [address]  Blood gas, venous     Status: Abnormal   Collection Time: 08/02/18  5:20 PM  Result Value Ref Range   pH, Ven 7.292 7.250 - 7.430   pCO2, Ven 71.9 (HH) 44.0 - 60.0 mmHg    Comment: CRITICAL RESULT CALLED TO, READ BACK BY AND VERIFIED WITH: E.WENTZ, MD AT 1724 BY M.JESTER RRT, RCP ON 08/02/2018    pO2, Ven BELOW REPORTABLE RANGE 32.0 - 45.0 mmHg    Comment: CRITICAL RESULT CALLED TO, READ BACK BY AND VERIFIED WITH: E.WENTZ, MD AT 1724 BY M.JESTER RRT, RCP ON 08/02/2018    Bicarbonate 33.7 (H) 20.0 - 28.0 mmol/L   Acid-Base Excess 5.1 (H) 0.0 - 2.0 mmol/L   O2 Saturation 36.5 %   Patient temperature 98.6    Collection site VENOUS    Drawn by DRAWN BY RN    Sample type VENOUS     Comment: Performed at [facility], [address], [place], [place] [address]   No results found.  Pending Labs Unresulted Labs (From admission, onward)    Start     Ordered   08/02/18 1436  Culture, blood (routine x 2)  BLOOD CULTURE X 2,   STAT     08/02/18 1436   08/02/18 1436  Lactic acid, plasma  Now then every 2 hours,   STAT     08/02/18 1436   Signed and Held  HIV antibody (Routine Testing)  Once,   R     Signed and Held   Signed and Held  Basic metabolic panel  Tomorrow morning,   R     Signed and Held   Signed and Held  CBC  Tomorrow morning,   R     Signed and Held          Vitals/Pain Today's Vitals   08/02/18 2000 08/02/18 2006 08/02/18 2030 08/02/18 2100  BP: (!) 147/95 (!) 147/95 (!) 137/101 124/73  Pulse: 91 87 89 84  Resp: 14 19 20 17   Temp:      TempSrc:      SpO2: 96% 91% 91% 97%  Weight:      Height:      PainSc:        Isolation Precautions No active  isolations  Medications Medications  furosemide (LASIX) injection 80 mg (80 mg Intravenous Given 08/02/18 1541)    Mobility non-ambulatory

## 2018-08-02 NOTE — ED Provider Notes (Signed)
MSE was initiated and I personally evaluated the patient and placed orders (if any) at  2:36 PM on August 02, 2018.  The patient appears stable so that the remainder of the MSE may be completed by another provider.  63 year old male from [facility] for evaluation of lethargy.  Facility said his sats were low on oxygen.  Patient himself denies complaints although still seems very sleepy.  His initial EKG shown to me shows a A. fib with a rate of 60.  This is new compared to prior EKGs.  Lungs are clear with diminished breath sounds bilaterally.  Abdomen obese but soft nontender.   Terrilee Files, MD 08/02/18 [phone]

## 2018-08-02 NOTE — ED Provider Notes (Signed)
[facility]   CSN: [id number] Arrival date & time: 08/02/18  1350    History   Chief Complaint Chief Complaint  Patient presents with  . Altered Mental Status    HPI James Villegas is a 63 y.o. male.     HPI He presents from his nursing care facility for altered mental status with rapid breathing.  He was given extra oxygen today by increasing his nasal cannula from 2 to 3 L/min.  This transiently improved his mental status.  EMS arrived and transferred her here for evaluation.  He is not currently being treated with CPAP.  He is unable to give any history.  Imaging from his facility indicates that today his chest x-ray showed pulmonary vascular congestion.  The patient is DNR  Level 5 caveat-altered mental status    Past Medical History:  Diagnosis Date  . Anemia, secondary    SECONDARY TO ACUTE BLOOD LOSS  . Anxiety disorder   . Bloody stool 08/29/2011   intermittent along with constipation.   . 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 Depression   . 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 Dysphagia   . Hyperlipidemia   . Hypertension   . Lupus anticoagulant disorder (HCC) 1990  . Morbid obesity (HCC)   . PFO (patent foramen ovale)    TEE 2/10: EF 60%, trivial AI, mild Ao root dilatation, mod PFO with R-L shunting, atrial septal aneurysm;   echo 7/12: EF 65-70%, grade 1 diast dysfxn, mild MR, LVOT showed severe obstruction  . Protein C deficiency (HCC)   . Protein S deficiency (HCC)   . Rectus sheath hematoma 7/12   required reversal of anticoagulation and c/b DVT req. IVC filter  . Seizure disorder [facility])     Patient Active Problem List   Diagnosis Date Noted  . Anemia of other chronic disease 11/23/2013  . Pain in joint, ankle and foot 11/14/2013  . Other convulsions 10/19/2013  . Noninfectious  gastroenteritis and colitis 07/29/2013  . Left upper quadrant abdominal mass 07/28/2013  . Other specified disease of white blood cells 07/16/2013  . Ingrowing toenail with infection 05/20/2013  . Late effects of cerebrovascular disease 10/15/2012  . Pure hypercholesterolemia 10/15/2012  . Essential hypertension, benign 10/15/2012  . Hereditary and idiopathic peripheral neuropathy 10/15/2012  . 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 CVA (cerebral infarction) 08/29/2011  . DVT (deep venous thrombosis) (HCC) 08/29/2011  . Bloody stool 08/29/2011  . PFO (patent foramen ovale) 02/18/2011    Past Surgical History:  Procedure Laterality Date  . dental extraction     multiple  . vena cavogram  12/2010   INFERIOR        Home Medications    Prior to Admission medications   Medication Sig Start Date End Date Taking? Authorizing Provider  baclofen (LIORESAL) 20 MG tablet Take 20 mg by mouth 3 (three) times daily.     Yes [provider]  Carboxymeth-Glycerin-Polysorb (REFRESH OPTIVE ADVANCED OP) Place 1 drop into both eyes 4 (four) times daily. Wait 3-5 minutes between eye meds   Yes [provider]  ezetimibe (ZETIA) 10 MG tablet Take 10 mg by mouth daily.   Yes [provider]  fish oil-omega-3 fatty acids 1000 MG capsule Take 1 g by mouth 2 (  two) times daily.    Yes [provider]  furosemide (LASIX) 20 MG tablet Take 20 mg by mouth daily. 07/27/18  Yes [provider]  gabapentin (NEURONTIN) 300 MG capsule Take 300 mg by mouth 3 (three) times daily.     Yes [provider]  Melatonin 3 MG TABS Take 3 mg by mouth at bedtime.   Yes [provider]  metoprolol tartrate (LOPRESSOR) 25 MG tablet Take 25 mg by mouth 2 (two) times daily.    Yes [provider]  omeprazole (PRILOSEC) 20 MG capsule Take 20 mg by mouth at bedtime.    Yes [provider]  phenytoin  (DILANTIN) 100 MG ER capsule Take 200 mg by mouth 2 (two) times daily.   Yes [provider]  RESTASIS 0.05 % ophthalmic emulsion Place 1 drop into both eyes 2 (two) times daily. 06/03/18  Yes [provider]  rosuvastatin (CRESTOR) 40 MG tablet Take 40 mg by mouth at bedtime.    Yes [provider]  Skin Protectants, Misc. (EUCERIN) cream Apply 1 application topically daily. Apply to areas of dry skin once daily   Yes [provider]  traZODone (DESYREL) 50 MG tablet Take 50 mg by mouth at bedtime. 07/08/18  Yes [provider]  venlafaxine (EFFEXOR) 75 MG tablet Take 75 mg by mouth daily. 07/15/18  Yes [provider]  warfarin (COUMADIN) 2 MG tablet Take 2 mg by mouth See admin instructions. Take with 2.5 mg to equal 4.5 mg daily   Yes [provider]  warfarin (COUMADIN) 2.5 MG tablet Take 2.5 mg by mouth See admin instructions. Take with 2.0 mg daily to equal 4.5 mg daily   Yes [provider]  HYDROcodone-acetaminophen (NORCO/VICODIN) 5-325 MG per tablet Take one tablet by mouth every 12 hours as needed for pain Patient not taking: Reported on 08/02/2018 07/07/13   Kermit Balo, DO    Family History History reviewed. No pertinent family history.  Social History Social History   Tobacco Use  . Smoking status: Former Games developer  . Smokeless tobacco: Never Used  Substance Use Topics  . 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 Drug use: No     Allergies   Fenofibrate   Review of Systems Review of Systems  Unable to perform ROS: Mental status change     Physical Exam Updated Vital Signs BP (!) 147/95   Pulse 87   Temp 99.1 F (37.3 C) (Rectal)   Resp 19   Ht  (1.854 m)   Wt (!) 152.4 kg   SpO2 91%   BMI 44.33 kg/m   Physical Exam Vitals signs and nursing note reviewed.  Constitutional:      General: He is in acute distress.     Appearance: He is well-developed. He is obese. He is  ill-appearing. He is not toxic-appearing or diaphoretic.  HENT:     Head: Normocephalic and atraumatic.     Right Ear: External ear normal.     Left Ear: External ear normal.     Nose: Nose normal.     Mouth/Throat:     Mouth: Mucous membranes are moist.  Eyes:     Conjunctiva/sclera: Conjunctivae normal.     Pupils: Pupils are equal, round, and reactive to light.  Neck:     Musculoskeletal: Normal range of motion and neck supple.     Trachea: Phonation normal.  Cardiovascular:  Rate and Rhythm: Normal rate and regular rhythm.     Heart sounds: Normal heart sounds.  Pulmonary:     Effort: Pulmonary effort is normal.     Breath sounds: No stridor. No rhonchi.     Comments: Tachypneic Chest:     Chest wall: No tenderness.  Abdominal:     General: There is no distension.     Palpations: Abdomen is soft.     Tenderness: There is no abdominal tenderness.  Musculoskeletal: Normal range of motion.        General: Swelling (Bilateral legs) present. No signs of injury.  Skin:    General: Skin is warm and dry.  Neurological:     Mental Status: He is lethargic.     GCS: GCS eye subscore is 2. GCS verbal subscore is 1. GCS motor subscore is 4.     Cranial Nerves: No cranial nerve deficit.     Motor: No abnormal muscle tone.  Psychiatric:        Behavior: Behavior normal.        Thought Content: Thought content normal.        Judgment: Judgment normal.      ED Treatments / Results  Labs (all labs ordered are listed, but only abnormal results are displayed) Labs Reviewed  COMPREHENSIVE METABOLIC PANEL - Abnormal; Notable for the following components:      Result Value   Glucose, Bld 105 (*)    Calcium 8.6 (*)    All other components within normal limits  BRAIN NATRIURETIC PEPTIDE - Abnormal; Notable for the following components:   B Natriuretic Peptide 234.9 (*)    All other components within normal limits  CBC WITH DIFFERENTIAL/PLATELET - Abnormal; Notable for the  following components:   Abs Immature Granulocytes 0.10 (*)    All other components within normal limits  BLOOD GAS, VENOUS - Abnormal; Notable for the following components:   pCO2, Ven 76.6 (*)    Bicarbonate 33.8 (*)    Acid-Base Excess 4.5 (*)    All other components within normal limits  PROTIME-INR - Abnormal; Notable for the following components:   Prothrombin Time 30.6 (*)    All other components within normal limits  BLOOD GAS, VENOUS - Abnormal; Notable for the following components:   pCO2, Ven 71.9 (*)    Bicarbonate 33.7 (*)    Acid-Base Excess 5.1 (*)    All other components within normal limits  CULTURE, BLOOD (ROUTINE X 2)  CULTURE, BLOOD (ROUTINE X 2)  TROPONIN I  LACTIC ACID, PLASMA  LACTIC ACID, PLASMA    EKG EKG Interpretation  Date/Time:  Monday August 02 2018 14:20:40 EST Ventricular Rate:  60 PR Interval:    QRS Duration: 146 QT Interval:  447 QTC Calculation: 447 R Axis:   95 Text Interpretation:  Atrial fibrillation Right bundle branch block new afib from prior 7/12 Confirmed by Meridee Score [phone]) on 08/02/2018 2:29:57 PM   Radiology No results found.  Procedures .Critical Care Performed by: Mancel Bale, MD Authorized by: Mancel Bale, MD   Critical care provider statement:    Critical care time (minutes):  50   Critical care start time:  08/02/2018 3:30 PM   Critical care end time:  08/02/2018 8:42 PM   Critical care time was exclusive of:  Separately billable procedures and treating other patients   Critical care was necessary to treat or prevent imminent or life-threatening deterioration of the following conditions:  Respiratory failure   Critical care  was time spent personally by me on the following activities:  Blood draw for specimens, development of treatment plan with patient or surrogate, discussions with consultants, evaluation of patient's response to treatment, examination of patient, obtaining history from patient or  surrogate, ordering and performing treatments and interventions, ordering and review of laboratory studies, pulse oximetry, re-evaluation of patient's condition, review of old charts and ordering and review of radiographic studies   (including critical care time)  Medications Ordered in ED Medications  furosemide (LASIX) injection 80 mg (80 mg Intravenous Given 08/02/18 1541)     Initial Impression / Assessment and Plan / ED Course  I have reviewed the triage vital signs and the nursing notes.  Pertinent labs & imaging results that were available during my care of the patient were reviewed by me and considered in my medical decision making (see chart for details).         Patient Vitals for the past 24 hrs:  BP Temp Temp src Pulse Resp SpO2 Height Weight  08/02/18 2006 (!) 147/95 - - 87 19 91 % - -  08/02/18 1934 129/89 - - 76 18 94 % - -  08/02/18 1933 129/89 - - 80 15 92 % - -  08/02/18 1900 135/85 - - 85 17 94 % - -  08/02/18 1630 109/75 - - 70 18 92 % - -  08/02/18 1600 122/77 - - 78 18 93 % - -  08/02/18 1530 123/70 - - 61 18 93 % - -  08/02/18 1525 - - - 65 (!) 22 95 % - -  08/02/18 1500 136/86 - - (!) 51 (!) 24 95 % - -  08/02/18 1430 136/65 99.1 F (37.3 C) Rectal (!) 55 16 95 % - -  08/02/18 1417 - - - - - - 6\' 1"  (1.854 m) (!) 152.4 kg  08/02/18 1413 - - - - - 93 % - -    8:2 PM Reevaluation with update and discussion. After initial assessment and treatment, an updated evaluation reveals he is now alert and conversant.  He understands that he needs to stay for further care and treatment. Mancel Bale   Medical Decision Making: Acute respiratory failure with hypercapnia.  Likely related to sleep apnea, acute on chronic.  Incidental pulmonary vascular congestion, treated with Lasix with improvement and diuresis, rater than 2 L.  He requires admission for further care and treatment.  CRITICAL CARE-yes Performed by: Mancel Bale  Nursing Notes Reviewed/ Care  Coordinated Applicable Imaging Reviewed Interpretation of Laboratory Data incorporated into ED treatment   8:43 PM-Consult complete with hospitalist. Patient case explained and discussed.  He agrees to admit patient for further evaluation and treatment. Call ended at 8:51 PM  Plan: Admit  Final Clinical Impressions(s) / ED Diagnoses   Final diagnoses:  Acute on chronic respiratory failure with hypercapnia (HCC)  Acute on chronic congestive heart failure, unspecified heart failure type [facility])    ED Discharge Orders    None       Mancel Bale, MD 08/02/18 2051

## 2018-08-03 ENCOUNTER — Encounter (HOSPITAL_COMMUNITY): Payer: Self-pay | Admitting: Cardiology

## 2018-08-03 ENCOUNTER — Inpatient Hospital Stay (HOSPITAL_COMMUNITY)
Admit: 2018-08-03 | Discharge: 2018-08-03 | Disposition: A | Discharge status: Home / Self Care | Payer: Medicare Other | Attending: Internal Medicine | Admitting: Internal Medicine

## 2018-08-03 ENCOUNTER — Inpatient Hospital Stay (HOSPITAL_COMMUNITY): Payer: Medicare Other

## 2018-08-03 ENCOUNTER — Other Ambulatory Visit: Payer: Self-pay

## 2018-08-03 DIAGNOSIS — I10 Essential (primary) hypertension: Secondary | ICD-10-CM

## 2018-08-03 DIAGNOSIS — I4891 Unspecified atrial fibrillation: Secondary | ICD-10-CM

## 2018-08-03 DIAGNOSIS — R4182 Altered mental status, unspecified: Secondary | ICD-10-CM

## 2018-08-03 DIAGNOSIS — I4821 Permanent atrial fibrillation: Secondary | ICD-10-CM

## 2018-08-03 DIAGNOSIS — I509 Heart failure, unspecified: Secondary | ICD-10-CM

## 2018-08-03 DIAGNOSIS — Z7901 Long term (current) use of anticoagulants: Secondary | ICD-10-CM

## 2018-08-03 DIAGNOSIS — J9602 Acute respiratory failure with hypercapnia: Secondary | ICD-10-CM

## 2018-08-03 DIAGNOSIS — G9341 Metabolic encephalopathy: Secondary | ICD-10-CM

## 2018-08-03 LAB — HIV ANTIBODY (ROUTINE TESTING W REFLEX): HIV Screen 4th Generation wRfx: NONREACTIVE

## 2018-08-03 LAB — BLOOD GAS, ARTERIAL
Acid-Base Excess: 8.2 mmol/L — ABNORMAL HIGH (ref 0.0–2.0)
Bicarbonate: 35.6 mmol/L — ABNORMAL HIGH (ref 20.0–28.0)
Delivery systems: POSITIVE
Drawn by: 295031
Expiratory PAP: 5
FIO2: 40
Inspiratory PAP: 15
Mode: POSITIVE
O2 Saturation: 95.4 %
Patient temperature: 98.6
pCO2 arterial: 63.8 mmHg — ABNORMAL HIGH (ref 32.0–48.0)
pH, Arterial: 7.365 (ref 7.350–7.450)
pO2, Arterial: 81 mmHg — ABNORMAL LOW (ref 83.0–108.0)

## 2018-08-03 LAB — BASIC METABOLIC PANEL
Anion gap: 7 (ref 5–15)
BUN: 11 mg/dL (ref 6–20)
CO2: 34 mmol/L — ABNORMAL HIGH (ref 22–32)
Calcium: 8.7 mg/dL — ABNORMAL LOW (ref 8.9–10.3)
Chloride: 100 mmol/L (ref 98–111)
Creatinine, Ser: 0.64 mg/dL (ref 0.61–1.24)
GFR calc Af Amer: >60 mL/min (ref >60)
GFR calc non Af Amer: >60 mL/min (ref >60)
Glucose, Bld: 91 mg/dL (ref 70–99)
Potassium: 3.9 mmol/L (ref 3.5–5.1)
Sodium: 141 mmol/L (ref 135–145)

## 2018-08-03 LAB — PROTIME-INR
INR: 3.2 — AB (ref 0.8–1.2)
Prothrombin Time: 32.4 seconds — ABNORMAL HIGH (ref 11.4–15.2)

## 2018-08-03 LAB — CBC
HCT: 45 % (ref 39.0–52.0)
Hemoglobin: 13.9 g/dL (ref 13.0–17.0)
MCH: 30.2 pg (ref 26.0–34.0)
MCHC: 30.9 g/dL (ref 30.0–36.0)
MCV: 97.6 fL (ref 80.0–100.0)
Platelets: 199 10*3/uL (ref 150–400)
RBC: 4.61 MIL/uL (ref 4.22–5.81)
RDW: 14 % (ref 11.5–15.5)
WBC: 8.2 10*3/uL (ref 4.0–10.5)
nRBC: 0 % (ref 0.0–0.2)

## 2018-08-03 MED ORDER — ORAL CARE MOUTH RINSE
15.0000 mL | Freq: Two times a day (BID) | OROMUCOSAL | Status: DC
  Administered 2018-08-03 – 2018-08-05 (×4): 15 mL via OROMUCOSAL

## 2018-08-03 MED ORDER — FUROSEMIDE 10 MG/ML IJ SOLN
80.0000 mg | Freq: Two times a day (BID) | INTRAMUSCULAR | Status: DC
  Administered 2018-08-03 – 2018-08-06 (×6): 80 mg via INTRAVENOUS
  Filled 2018-08-03 (×7): qty 8

## 2018-08-03 MED ORDER — SPIRONOLACTONE 12.5 MG HALF TABLET
12.5000 mg | ORAL_TABLET | Freq: Every day | ORAL | Status: DC
  Administered 2018-08-03 – 2018-08-04 (×2): 12.5 mg via ORAL
  Filled 2018-08-03 (×2): qty 1

## 2018-08-03 MED ORDER — FUROSEMIDE 10 MG/ML IJ SOLN
40.0000 mg | Freq: Two times a day (BID) | INTRAMUSCULAR | Status: DC
  Administered 2018-08-03: 40 mg via INTRAVENOUS
  Filled 2018-08-03: qty 4

## 2018-08-03 MED ORDER — CHLORHEXIDINE GLUCONATE 0.12 % MT SOLN
15.0000 mL | Freq: Two times a day (BID) | OROMUCOSAL | Status: DC
  Administered 2018-08-03 – 2018-08-06 (×6): 15 mL via OROMUCOSAL
  Filled 2018-08-03 (×7): qty 15

## 2018-08-03 MED ORDER — FUROSEMIDE 10 MG/ML IJ SOLN
40.0000 mg | Freq: Once | INTRAMUSCULAR | Status: AC
  Administered 2018-08-03: 40 mg via INTRAVENOUS
  Filled 2018-08-03: qty 4

## 2018-08-03 NOTE — Progress Notes (Signed)
Patient ID: James Villegas, male   DOB: 03/09/1956, 63 y.o.   MRN: [id number]  PROGRESS NOTE    James Villegas  BOE:[id number] DOB: [birth year] DOA: 08/02/2018 PCP: [name], MD   Brief Narrative:  63 year old male with history of left MCA CVA with right hemiparesis, lupus anticoagulant disorder, right lower extremity DVT status post IVC filter, bilateral pulmonary involvement, morbid obesity, protein C and protein S deficiency, patent foreman ovale, rectus sheath hematoma, depression, anxiety, hypertension and dyslipidemia presented with somnolence on 08/02/2018.  He was found to be in acute hypercapnic respiratory failure for which he required noninvasive mechanical ventilation and IV Lasix.  Assessment & Plan:   Active Problems:   Cerebral infarction [facility])   DVT (deep venous thrombosis) (HCC)   Essential hypertension, benign   Respiratory failure (HCC)  Acute hypercapnic respiratory failure -Most likely from CHF exacerbation.  Started on BiPAP on admission.  Still requiring BiPAP.  Will check ABG and wean to nasal cannula oxygen if possible -Patient currently very sleepy and drowsy. -If respiratory status does not improve with diuresis, consider CT of the chest.  Probable acute CHF, unclear at this time if the systolic versus diastolic -Patient was given IV Lasix in the ED and continued on the same.  Still has bilateral lower extremity edema.  Still requiring BiPAP.  Will increase Lasix to 80 mg IV every 12 hours.Strict input and output.  Negative balance of 4675 cc since admission.  2D echo.  Cardiology evaluation.  Continue metoprolol  New onset atrial flutter/fibrillation -Currently rate controlled.  Continue metoprolol.  Cardiology evaluation.  Already on anticoagulation with Coumadin.  History of DVT/PE -Continue Coumadin.  Monitor INR.  Acute metabolic encephalopathy  -Probably from hypoxia/hypercapnia.  Will hold pain medications and sedatives for now.  Patient has  history of seizures.  Will get EEG -.  If mental status does not improve with CHF treatment and improvement of hypercapnia, consider imaging of the brain. -Monitor mental status.  Fall precautions  History of seizures -Continue phenytoin.  Will check phenytoin level for tomorrow  Hypertension  -Monitor blood pressure.  Continue IV Lasix as above.  Continue metoprolol  Depression -Hold trazodone and venlafaxine.  Monitor  History of CVA with right hemiparesis, wheelchair-bound -PT evaluation.  Morbid obesity -Outpatient follow-up  Dyslipidemia -Continue statin   DVT prophylaxis: Coumadin Code Status: DNR as per most form Family Communication: None at bedside Disposition Plan: Depends on clinical outcome  Consultants: Cardiology  Procedures: None  Antimicrobials: None   Subjective: Patient seen and examined at bedside.  He is very drowsy on BiPAP.  Hardly wakes up.  No overnight fever or vomiting reported by nursing staff.  Objective: Vitals:   08/03/18 0600 08/03/18 0742 08/03/18 0800 08/03/18 0933  BP: (!) 141/89  (!) 145/74   Pulse: 85 85 96 77  Resp:   16   Temp:   98.3 F (36.8 C)   TempSrc:   Axillary   SpO2: 100% 97% 96%   Weight:      Height:        Intake/Output Summary (Last 24 hours) at 08/03/2018 1047 Last data filed at 08/03/2018 0900 Gross per 24 hour  Intake -  Output 4675 ml  Net -4675 ml   Filed Weights   08/02/18 1417  Weight: (!) 152.4 kg    Examination:  General exam: Appears older than stated age.  Sleepy, on BiPAP, hardly wakes up.  No distress Respiratory system: Bilateral decreased breath sounds at bases  with basilar crackles, no wheezing Cardiovascular system: S1 & S2 heard, Rate controlled Gastrointestinal system: Abdomen is morbidly obese, nondistended, soft and nontender. Normal bowel sounds heard. Extremities: No cyanosis, clubbing; 2-3+ bilateral lower extremity pitting edema  Central nervous system: Very drowsy, hardly  wakes up.  No focal neurological deficits.  Skin: No rashes, lesions or ulcers Psychiatry: Could not be assessed because of mental status.    Data Reviewed: I have personally reviewed following labs and imaging studies  CBC: Recent Labs  Lab 08/02/18 1451 08/03/18 0311  WBC 8.7 8.2  NEUTROABS 6.6  --   HGB 13.2 13.9  HCT 43.4 45.0  MCV 98.4 97.6  PLT 236 199   Basic Metabolic Panel: Recent Labs  Lab 08/02/18 1451 08/03/18 0311  NA 139 141  K 4.7 3.9  CL 102 100  CO2 31 34*  GLUCOSE 105* 91  BUN 12 11  CREATININE 0.64 0.64  CALCIUM 8.6* 8.7*   GFR: Estimated Creatinine Clearance: 151.3 mL/min (by C-G formula based on SCr of 0.64 mg/dL). Liver Function Tests: Recent Labs  Lab 08/02/18 1451  AST 20  ALT 19  ALKPHOS 112  BILITOT 0.4  PROT 7.0  ALBUMIN 3.6   No results for input(s): LIPASE, AMYLASE in the last 168 hours. No results for input(s): AMMONIA in the last 168 hours. Coagulation Profile: Recent Labs  Lab 08/02/18 1451 08/03/18 0311  INR 3.0 3.2*   Cardiac Enzymes: Recent Labs  Lab 08/02/18 1451  TROPONINI <0.03   BNP (last 3 results) No results for input(s): PROBNP in the last 8760 hours. HbA1C: No results for input(s): HGBA1C in the last 72 hours. CBG: No results for input(s): GLUCAP in the last 168 hours. Lipid Profile: No results for input(s): CHOL, HDL, LDLCALC, TRIG, CHOLHDL, LDLDIRECT in the last 72 hours. Thyroid Function Tests: No results for input(s): TSH, T4TOTAL, FREET4, T3FREE, THYROIDAB in the last 72 hours. Anemia Panel: No results for input(s): VITAMINB12, FOLATE, FERRITIN, TIBC, IRON, RETICCTPCT in the last 72 hours. Sepsis Labs: Recent Labs  Lab 08/02/18 1451  LATICACIDVEN 1.0    Recent Results (from the past 240 hour(s))  MRSA PCR Screening     Status: None   Collection Time: 08/02/18 10:00 PM  Result Value Ref Range Status   MRSA by PCR NEGATIVE NEGATIVE Final    Comment:        The GeneXpert MRSA Assay  (FDA approved for NASAL specimens only), is one component of a comprehensive MRSA colonization surveillance program. It is not intended to diagnose MRSA infection nor to guide or monitor treatment for MRSA infections. Performed at [facility], [address] [place], [place] [address]          Radiology Studies: Dg Chest 1 View  Result Date: 08/03/2018 CLINICAL DATA:  Dyspnea EXAM: CHEST  1 VIEW COMPARISON:  08/25/2014 FINDINGS: Shallow inspiration. Cardiac enlargement. Mild vascular congestion. Perihilar infiltration, greater on the left, likely representing mild edema. Small bilateral pleural effusions. No pneumothorax. Mediastinal contours appear intact. IMPRESSION: Cardiac enlargement with mild pulmonary vascular congestion and perihilar edema. Small bilateral pleural effusions. Electronically Signed   By: Burman Nieves M.D.   On: 08/03/2018 00:10        Scheduled Meds: . baclofen  20 mg Oral TID  . chlorhexidine  15 mL Mouth Rinse BID  . cycloSPORINE  1 drop Both Eyes BID  . ezetimibe  10 mg Oral Daily  . furosemide  80 mg Intravenous Q12H  .  gabapentin  300 mg Oral TID  . mouth rinse  15 mL Mouth Rinse q12n4p  . Melatonin  3 mg Oral QHS  . metoprolol tartrate  25 mg Oral BID  . omega-3 acid ethyl esters  1 g Oral BID  . pantoprazole  40 mg Oral Daily  . phenytoin  200 mg Oral BID  . rosuvastatin  40 mg Oral QHS  . traZODone  50 mg Oral QHS  . venlafaxine  75 mg Oral Daily  . warfarin  4.5 mg Oral q1800  . Warfarin - Pharmacist Dosing Inpatient   Does not apply q1800   Continuous Infusions:   LOS: 1 day        Glade LloydKshitiz Ules Marsala, MD [facility] 08/03/2018, 10:47 AM

## 2018-08-03 NOTE — Procedures (Signed)
ELECTROENCEPHALOGRAM REPORT   Patient: James Villegas       Room #: 1230 EEG No. ID: [id number] Age: 63 y.o.        Sex: male Referring Physician: Hanley Ben Report Date:  08/03/2018        Interpreting Physician: Thana Farr  History: James Villegas is an 63 y.o. male with a history of stroke presenting with altered mental status  Medications:  Zetia, Lasix, lopressor, Lovaza, Protonix, Dilantin, Crestor, Coumadin  Conditions of Recording:  This is a 21 channel routine scalp EEG performed with bipolar and monopolar montages arranged in accordance to the international 10/20 system of electrode placement. One channel was dedicated to EKG recording.  The patient is in the awake state.  Description:  The waking background activity consists of a low voltage, symmetrical, fairly well organized, 9 Hz alpha activity, seen from the parieto-occipital and posterior temporal regions.  Low voltage fast activity, poorly organized, is seen anteriorly and is at times superimposed on more posterior regions.  A mixture of theta and alpha rhythms are seen from the central and temporal regions. The patient does not drowse or sleep. No epileptiform activity is noted.   Hyperventilation and intermittent photic stimulation were not performed.   IMPRESSION: This is a normal awake electroencephalogram, awake. There are no focal lateralizing or epileptiform features.   Thana Farr, MD Neurology [phone] 08/03/2018, 11:48 AM

## 2018-08-03 NOTE — Progress Notes (Signed)
  Echocardiogram 2D Echocardiogram has been attempted. Patient receiving personal care. Will reattempt at later date.  James Villegas 08/03/2018, 2:40 PM

## 2018-08-03 NOTE — Evaluation (Signed)
Clinical/Bedside Swallow Evaluation Patient Details  Name: James Villegas MRN: [id number] Date of Birth: Apr 03, 1956  Today's Date: 08/03/2018 Time: SLP Start Time (ACUTE ONLY): 1045 SLP Stop Time (ACUTE ONLY): 1115 SLP Time Calculation (min) (ACUTE ONLY): 30 min  Past Medical History:  Past Medical History:  Diagnosis Date  . Anemia, secondary    SECONDARY TO ACUTE BLOOD LOSS  . Anxiety disorder   . Bloody stool 08/29/2011   intermittent along with constipation.   . 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 Depression   . 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 Dysphagia   . Hyperlipidemia   . Hypertension   . Lupus anticoagulant disorder (HCC) 1990  . Morbid obesity (HCC)   . PFO (patent foramen ovale)    TEE 2/10: EF 60%, trivial AI, mild Ao root dilatation, mod PFO with R-L shunting, atrial septal aneurysm;   echo 7/12: EF 65-70%, grade 1 diast dysfxn, mild MR, LVOT showed severe obstruction  . Protein C deficiency (HCC)   . Protein S deficiency (HCC)   . Rectus sheath hematoma 7/12   required reversal of anticoagulation and c/b DVT req. IVC filter  . Seizure disorder [facility])    Past Surgical History:  Past Surgical History:  Procedure Laterality Date  . dental extraction     multiple  . vena cavogram  12/2010   INFERIOR   HPI:  pt is a 63 yo male adm to [facility] with AMS, somnolence = Pt is LEFT handed.   Pt resides at [facility].   Pt PMH + for left MCA with dysphagia, seizure, aphasia and hemiparesis.  Pt required bipap for respiratory support.  Pt with bilateral pleural effusions, perihilar edema with vascular congestion 08/03/2018 CXR.  EEG today to concern for seizures.     Assessment / Plan / Recommendation Clinical Impression  Pt with functional oropharyngeal swallow ability based on clinical evaluation.  No indication of aspiration or laryngeal penetration of any  consistency tested.  No oral residuals nor indication of pharyngeal residuals.  PT does have cervical osteophytes at C5-C6 - but these clinically do not appear to impact swallowing.  Educated him to compensation strategies for mitigating that risk.  Pt denies dysphagia nor reflux - Recommend regular/thin diet.    SLP Visit Diagnosis: Dysphagia, oropharyngeal phase (R13.12)    Aspiration Risk  Mild aspiration risk    Diet Recommendation Regular;Thin liquid   Liquid Administration via: Cup;No straw Medication Administration: Whole meds with liquid Supervision: Patient able to self feed Compensations: Slow rate;Small sips/bites Postural Changes: Seated upright at 90 degrees;Remain upright for at least 30 minutes after po intake    Other  Recommendations Oral Care Recommendations: Oral care BID   Follow up Recommendations None      Frequency and Duration            Prognosis Prognosis for Safe Diet Advancement: Good      Swallow Study   General Date of Onset: 08/03/18 HPI: pt is a 63 yo male adm to [facility] with AMS, somnolence = Pt is LEFT handed.   Pt resides at [facility].   Pt PMH + for left MCA with dysphagia, seizure, aphasia and hemiparesis.  Pt required bipap for respiratory support.  Pt with bilateral pleural effusions, perihilar edema with vascular congestion 08/03/2018 CXR.  EEG today to concern for seizures.  Type of Study: Bedside Swallow Evaluation Previous Swallow Assessment: MBS 07/2008 when he had left MCA CVA Diet Prior to this Study: (regular/thin at SNF) Temperature Spikes Noted: Yes(99.1) Respiratory Status: Nasal cannula(2 liters) History of Recent Intubation: No Behavior/Cognition: Alert;Cooperative;Pleasant mood Oral Cavity Assessment: Within Functional Limits Oral Care Completed by SLP: No Oral Cavity - Dentition: Adequate natural dentition Vision: Functional for self-feeding Self-Feeding Abilities: Able to feed self Patient Positioning: Upright in  bed Baseline Vocal Quality: Normal Volitional Cough: Strong Volitional Swallow: Able to elicit    Oral/Motor/Sensory Function Overall Oral Motor/Sensory Function: Moderate impairment Facial ROM: Reduced right Facial Symmetry: Abnormal symmetry right Facial Strength: Reduced right Facial Sensation: Reduced right Lingual ROM: Reduced right Lingual Symmetry: Within Functional Limits Lingual Strength: Within Functional Limits Lingual Sensation: Within Functional Limits Velum: Within Functional Limits Mandible: Within Functional Limits   Ice Chips Ice chips: Not tested Other Comments: pt with dental sensitivity   Thin Liquid Thin Liquid: Within functional limits Presentation: Cup    Nectar Thick Nectar Thick Liquid: Not tested   Honey Thick Honey Thick Liquid: Not tested   Puree Puree: Within functional limits Presentation: Self Fed;Spoon   Solid     Solid: Within functional limits Presentation: Self Fed;Spoon      James Villegas 08/03/2018,11:18 AM  James Burnet, MS [facility] SLP Acute Rehab Services Pager [phone] Office [phone]

## 2018-08-03 NOTE — Progress Notes (Signed)
MD paged. Patient in new afib, controlled

## 2018-08-03 NOTE — Progress Notes (Signed)
EEG completed, results pending. 

## 2018-08-03 NOTE — Progress Notes (Signed)
ANTICOAGULATION CONSULT NOTE - Initial Consult  Pharmacy Consult for warfarin Indication: DVT, Lupus anticoagulant, PE  Allergies  Allergen Reactions  . Fenofibrate Other (See Comments)    Per [facility]    Patient Measurements: Height: 6\' 1"  (185.4 cm) Weight: (!) 336 lb (152.4 kg) IBW/kg (Calculated) : 79.9 Heparin Dosing Weight:   Vital Signs: Temp: 98.6 F (37 C) (02/25 0352) Temp Source: Axillary (02/25 0352) BP: 132/87 (02/25 0300) Pulse Rate: 83 (02/25 0350)  Labs: Recent Labs    08/02/18 1451 08/03/18 0311  HGB 13.2 13.9  HCT 43.4 45.0  PLT 236 199  LABPROT 30.6* 32.4*  INR 3.0 3.2*  CREATININE 0.64 0.64  TROPONINI <0.03  --     Estimated Creatinine Clearance: 151.3 mL/min (by C-G formula based on SCr of 0.64 mg/dL).   Medical History: Past Medical History:  Diagnosis Date  . Anemia, secondary    SECONDARY TO ACUTE BLOOD LOSS  . Anxiety disorder   . Bloody stool 08/29/2011   intermittent along with constipation.   . 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 Depression   . 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 Dysphagia   . Hyperlipidemia   . Hypertension   . Lupus anticoagulant disorder (HCC) 1990  . Morbid obesity (HCC)   . PFO (patent foramen ovale)    TEE 2/10: EF 60%, trivial AI, mild Ao root dilatation, mod PFO with R-L shunting, atrial septal aneurysm;   echo 7/12: EF 65-70%, grade 1 diast dysfxn, mild MR, LVOT showed severe obstruction  . Protein C deficiency (HCC)   . Protein S deficiency (HCC)   . Rectus sheath hematoma 7/12   required reversal of anticoagulation and c/b DVT req. IVC filter  . Seizure disorder (HCC)     Medications:  Medications Prior to Admission  Medication Sig Dispense Refill Last Dose  . baclofen (LIORESAL) 20 MG tablet Take 20 mg by mouth 3 (three) times daily.     08/02/2018 at 1400  .  Carboxymeth-Glycerin-Polysorb (REFRESH OPTIVE ADVANCED OP) Place 1 drop into both eyes 4 (four) times daily. Wait 3-5 minutes between eye meds   08/02/2018 at 1400  . ezetimibe (ZETIA) 10 MG tablet Take 10 mg by mouth daily.   08/02/2018 at 1000  . fish oil-omega-3 fatty acids 1000 MG capsule Take 1 g by mouth 2 (two) times daily.    08/02/2018 at 1000  . furosemide (LASIX) 20 MG tablet Take 20 mg by mouth daily.   08/02/2018 at 1000  . gabapentin (NEURONTIN) 300 MG capsule Take 300 mg by mouth 3 (three) times daily.     08/02/2018 at 1400  . Melatonin 3 MG TABS Take 3 mg by mouth at bedtime.   08/01/2018 at 2100  . metoprolol tartrate (LOPRESSOR) 25 MG tablet Take 25 mg by mouth 2 (two) times daily.    08/02/2018 at 1000  . omeprazole (PRILOSEC) 20 MG capsule Take 20 mg by mouth at bedtime.    08/01/2018 at 2100  . phenytoin (DILANTIN) 100 MG ER capsule Take 200 mg by mouth 2 (two) times daily.   08/02/2018 at 1000  . RESTASIS 0.05 % ophthalmic emulsion Place 1 drop into both eyes 2 (two) times daily.   08/02/2018 at 0900  . rosuvastatin (CRESTOR) 40 MG tablet Take 40 mg by mouth at bedtime.    08/01/2018 at 2200  .  Skin Protectants, Misc. (EUCERIN) cream Apply 1 application topically daily. Apply to areas of dry skin once daily   08/02/2018 at 0900  . traZODone (DESYREL) 50 MG tablet Take 50 mg by mouth at bedtime.   08/01/2018 at 2100  . venlafaxine (EFFEXOR) 75 MG tablet Take 75 mg by mouth daily.   08/02/2018 at 0900  . warfarin (COUMADIN) 2 MG tablet Take 2 mg by mouth See admin instructions. Take with 2.5 mg to equal 4.5 mg daily   08/01/2018 at 600 pm  . warfarin (COUMADIN) 2.5 MG tablet Take 2.5 mg by mouth See admin instructions. Take with 2.0 mg daily to equal 4.5 mg daily   08/01/2018 at 600 pm  . HYDROcodone-acetaminophen (NORCO/VICODIN) 5-325 MG per tablet Take one tablet by mouth every 12 hours as needed for pain (Patient not taking: Reported on 08/02/2018) 60 tablet 0 Not Taking at Unknown time    Scheduled:  . baclofen  20 mg Oral TID  . cycloSPORINE  1 drop Both Eyes BID  . ezetimibe  10 mg Oral Daily  . furosemide  40 mg Intravenous Q12H  . gabapentin  300 mg Oral TID  . Melatonin  3 mg Oral QHS  . metoprolol tartrate  25 mg Oral BID  . omega-3 acid ethyl esters  1 g Oral BID  . pantoprazole  40 mg Oral Daily  . phenytoin  200 mg Oral BID  . rosuvastatin  40 mg Oral QHS  . traZODone  50 mg Oral QHS  . venlafaxine  75 mg Oral Daily  . warfarin  4.5 mg Oral q1800  . Warfarin - Pharmacist Dosing Inpatient   Does not apply q1800    Assessment: Patient with INR at goal (2.5-3.5) on admit with value of 3.  Last dose noted 2/23.  Goal of Therapy:  INR 2.5-3.5    Plan:  Warfarin 4.5mg  po daily Daily INR  Darlina Guys, Jossalin Chervenak Crowford 08/03/2018,5:20 AM

## 2018-08-03 NOTE — Progress Notes (Addendum)
Follow chest film with vascular congestion, will continue with IV furosemide and will check echocardiogram.

## 2018-08-03 NOTE — Consult Note (Addendum)
Cardiology Consultation:   Patient ID: James Villegas MRN: [id number]; DOB: 1955-10-07  Admit date: 08/02/2018 Date of Consult: 08/03/2018  Primary Care Provider: [name], MD Primary Cardiologist: No primary care provider on file. Excell Seltzer has seen for PFO Primary Electrophysiologist:  None    Patient Profile:   James Villegas is a 63 y.o. 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 and + lupus anticoagulant, maintained on lovenox who is being seen today for the evaluation of CHF/ a flutter on admit of acute hypercapnic RF, on BiPAP at the request of De. Alekh.  History of Present Illness:   [name] with above hx and per Dr. Excell Seltzer (last seen 2013) no closure of PFO -medication with anticoagulation only.    Pt now admitted 08/02/08 with hypoxia by EMS from [facility] assisted living.  Other hx with HTN and dyslipidemia.   Pt developed somnolence yesterday with AMS, rapid breathing.  He had been having DOE.  Pt is in wheelchair since CVA.    Pt now on coumadin since last visit with Dr. Excell Seltzer.  Pt was found to be in a flutter as well.   Pt has never been told he has a fib.     EKG:  The EKG was personally reviewed and demonstrates:  A flutter rate controlled with RBBB this is new from our last EKG in 2012.  This AM a flutter RBBB and T wave abnormality in inf lat leads. Telemetry:  Telemetry was personally reviewed and demonstrates:  A flutter rate controlled  Na 141, K+ 3.9, BUN 11, Cr 0.64  LFTs normal  Hgb 13.9 plts 199  BNP 234 troponin poc <0.03  INR 3.2   PCXR:  IMPRESSION: Cardiac enlargement with mild pulmonary vascular congestion and perihilar edema. Small bilateral pleural effusions.  Currently pt tells me he was very weak yesterday.  No chest pain.  He does not mention dyspnea.   Past Medical History:  Diagnosis Date  .  Anemia, secondary    SECONDARY TO ACUTE BLOOD LOSS  . Anxiety disorder   . Bloody stool 08/29/2011   intermittent along with constipation.   . 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 Depression   . 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 Dysphagia   . Hyperlipidemia   . Hypertension   . Lupus anticoagulant disorder (HCC) 1990  . Morbid obesity (HCC)   . PFO (patent foramen ovale)    TEE 2/10: EF 60%, trivial AI, mild Ao root dilatation, mod PFO with R-L shunting, atrial septal aneurysm;   echo 7/12: EF 65-70%, grade 1 diast dysfxn, mild MR, LVOT showed severe obstruction  . Protein C deficiency (HCC)   . Protein S deficiency (HCC)   . Rectus sheath hematoma 7/12   required reversal of anticoagulation and c/b DVT req. IVC filter  . Seizure disorder [facility])     Past Surgical History:  Procedure Laterality Date  . dental extraction     multiple  . vena cavogram  12/2010   INFERIOR     Home Medications:  Prior to Admission medications   Medication Sig Start Date End Date Taking? Authorizing Provider  baclofen (LIORESAL) 20 MG tablet Take 20 mg by mouth 3 (three) times  daily.     Yes [provider]  Carboxymeth-Glycerin-Polysorb (REFRESH OPTIVE ADVANCED OP) Place 1 drop into both eyes 4 (four) times daily. Wait 3-5 minutes between eye meds   Yes [provider]  ezetimibe (ZETIA) 10 MG tablet Take 10 mg by mouth daily.   Yes [provider]  fish oil-omega-3 fatty acids 1000 MG capsule Take 1 g by mouth 2 (two) times daily.    Yes [provider]  furosemide (LASIX) 20 MG tablet Take 20 mg by mouth daily. 07/27/18  Yes [provider]  gabapentin (NEURONTIN) 300 MG capsule Take 300 mg by mouth 3 (three) times daily.     Yes [provider]  Melatonin 3 MG TABS Take 3 mg by mouth at bedtime.   Yes [provider]    metoprolol tartrate (LOPRESSOR) 25 MG tablet Take 25 mg by mouth 2 (two) times daily.    Yes [provider]  omeprazole (PRILOSEC) 20 MG capsule Take 20 mg by mouth at bedtime.    Yes [provider]  phenytoin (DILANTIN) 100 MG ER capsule Take 200 mg by mouth 2 (two) times daily.   Yes [provider]  RESTASIS 0.05 % ophthalmic emulsion Place 1 drop into both eyes 2 (two) times daily. 06/03/18  Yes [provider]  rosuvastatin (CRESTOR) 40 MG tablet Take 40 mg by mouth at bedtime.    Yes [provider]  Skin Protectants, Misc. (EUCERIN) cream Apply 1 application topically daily. Apply to areas of dry skin once daily   Yes [provider]  traZODone (DESYREL) 50 MG tablet Take 50 mg by mouth at bedtime. 07/08/18  Yes [provider]  venlafaxine (EFFEXOR) 75 MG tablet Take 75 mg by mouth daily. 07/15/18  Yes [provider]  warfarin (COUMADIN) 2 MG tablet Take 2 mg by mouth See admin instructions. Take with 2.5 mg to equal 4.5 mg daily   Yes [provider]  warfarin (COUMADIN) 2.5 MG tablet Take 2.5 mg by mouth See admin instructions. Take with 2.0 mg daily to equal 4.5 mg daily   Yes [provider]  HYDROcodone-acetaminophen (NORCO/VICODIN) 5-325 MG per tablet Take one tablet by mouth every 12 hours as needed for pain Patient not taking: Reported on 08/02/2018 07/07/13   Kermit Balo, DO    Inpatient Medications: Scheduled Meds: . baclofen  20 mg Oral TID  . chlorhexidine  15 mL Mouth Rinse BID  . cycloSPORINE  1 drop Both Eyes BID  . ezetimibe  10 mg Oral Daily  . furosemide  80 mg Intravenous Q12H  . gabapentin  300 mg Oral TID  . mouth rinse  15 mL Mouth Rinse q12n4p  . Melatonin  3 mg Oral QHS  . metoprolol tartrate  25 mg Oral BID  . omega-3 acid ethyl esters  1 g Oral BID  . pantoprazole  40 mg Oral Daily  . phenytoin  200 mg Oral BID  . rosuvastatin  40 mg Oral QHS  . traZODone  50 mg  Oral QHS  . venlafaxine  75 mg Oral Daily  . warfarin  4.5 mg Oral q1800  . Warfarin - Pharmacist Dosing Inpatient   Does not apply q1800   Continuous Infusions:  PRN Meds: acetaminophen **OR** acetaminophen, ondansetron **OR** ondansetron (ZOFRAN) IV  Allergies:    Allergies  Allergen Reactions  . Fenofibrate Other (See Comments)    Per MAR    Social History:   Social History  Socioeconomic History  . Marital status: Single    Spouse name: Not on file  . Number of children: Not on file  . Years of education: Not on file  . Highest education level: Not on file  Occupational History  . Not on file  Social Needs  . Financial resource strain: Not on file  . Food insecurity:    Worry: Not on file    Inability: Not on file  . Transportation needs:    Medical: Not on file    Non-medical: Not on file  Tobacco Use  . Smoking status: Former Games developermoker  . Smokeless tobacco: Never Used  Substance and Sexual Activity  . 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 Drug use: No  . Sexual activity: Not on file  Lifestyle  . Physical activity:    Days per week: Not on file    Minutes per session: Not on file  . Stress: Not on file  Relationships  . Social connections:    Talks on phone: Not on file    Gets together: Not on file    Attends religious service: Not on file    Active member of club or organization: Not on file    Attends meetings of clubs or organizations: Not on file    Relationship status: Not on file  . Intimate partner violence:    Fear of current or ex partner: Not on file    Emotionally abused: Not on file    Physically abused: Not on file    Forced sexual activity: Not on file  Other Topics Concern  . Not on file  Social History Narrative  . Not on file    Family History:   History reviewed. No pertinent family history.  Mother died from brian tumor and father from gunshot.  ROS:  Please see the history of present illness.  General:no colds  or fevers, no weight changes Skin:no rashes or ulcers HEENT:no blurred vision, no congestion CV:see HPI PUL:see HPI GI:no diarrhea constipation or melena, no indigestion GU:no hematuria, no dysuria MS:no joint pain, no claudication Neuro:no syncope, no lightheadedness Endo:no diabetes, no thyroid disease   All other ROS reviewed and negative.     Physical Exam/Data:   Vitals:   08/03/18 0600 08/03/18 0742 08/03/18 0800 08/03/18 0933  BP: (!) 141/89  (!) 145/74   Pulse: 85 85 96 77  Resp:   16   Temp:   98.3 F (36.8 C)   TempSrc:   Axillary   SpO2: 100% 97% 96%   Weight:      Height:        Intake/Output Summary (Last 24 hours) at 08/03/2018 1030 Last data filed at 08/03/2018 0900 Gross per 24 hour  Intake -  Output 4675 ml  Net -4675 ml   Last 3 Weights 08/02/2018 03/18/2014 02/23/2014  Weight (lbs) 336 lb 313 lb 295 lb  Weight (kg) 152.409 kg 141.976 kg 133.811 kg     Body mass index is 44.33 kg/m.  General:  Well nourished, well developed, in no acute distress HEENT: normal Lymph: no adenopathy Neck: no JVD Endocrine:  No thryomegaly Vascular: No carotid bruits; pedal pulses 1+ bilaterally   Cardiac:  normal S1, S2; RRR mostly occ irregular; no murmur gallup or rub Lungs:  clear to diminished to auscultation bilaterally, no wheezing, rhonchi or rales  Abd: obese, soft, nontender, no hepatomegaly  Ext: 3+ Rt foot edema and 2+ Lt foot edema up  into ankles. Musculoskeletal:  No deformities, Rt hemiparesis  Skin: warm and dry  Neuro:  CNs 2-12 intact, no focal abnormalities noted Psych:  Normal affect to flat  Relevant CV Studies: Echo pending  Laboratory Data:  Chemistry Recent Labs  Lab 08/02/18 1451 08/03/18 0311  NA 139 141  K 4.7 3.9  CL 102 100  CO2 31 34*  GLUCOSE 105* 91  BUN 12 11  CREATININE 0.64 0.64  CALCIUM 8.6* 8.7*  GFRNONAA >60 >60  GFRAA >60 >60  ANIONGAP 6 7    Recent Labs  Lab 08/02/18 1451  PROT 7.0  ALBUMIN 3.6  AST  20  ALT 19  ALKPHOS 112  BILITOT 0.4   Hematology Recent Labs  Lab 08/02/18 1451 08/03/18 0311  WBC 8.7 8.2  RBC 4.41 4.61  HGB 13.2 13.9  HCT 43.4 45.0  MCV 98.4 97.6  MCH 29.9 30.2  MCHC 30.4 30.9  RDW 13.8 14.0  PLT 236 199   Cardiac Enzymes Recent Labs  Lab 08/02/18 1451  TROPONINI <0.03   No results for input(s): TROPIPOC in the last 168 hours.  BNP Recent Labs  Lab 08/02/18 1451  BNP 234.9*    DDimer No results for input(s): DDIMER in the last 168 hours.  Radiology/Studies:  Dg Chest 1 View  Result Date: 08/03/2018 CLINICAL DATA:  Dyspnea EXAM: CHEST  1 VIEW COMPARISON:  08/25/2014 FINDINGS: Shallow inspiration. Cardiac enlargement. Mild vascular congestion. Perihilar infiltration, greater on the left, likely representing mild edema. Small bilateral pleural effusions. No pneumothorax. Mediastinal contours appear intact. IMPRESSION: Cardiac enlargement with mild pulmonary vascular congestion and perihilar edema. Small bilateral pleural effusions. Electronically Signed   By: Burman Nieves M.D.   On: 08/03/2018 00:10    Assessment and Plan:   1. A fib /flutter rate controlled, unsure how long pt has been in.  Pt has never been told he has a fib pt is anticoagulated on coumadin   Dr. Delton See to see.  2. Acute CHF, echo pending. Neg 4675 no wt today on lasix 80 mg IV BID -continue 3. Acute respiratory failure followed by IM on BiPAP on admit with AMS 4. Hypercoagulable disorder with Protein C and S def.  On coumadin and hx of CVA and PE.   5. PFO with no plans to close, continue anticoagulant. 6. HTN continue lopressor on 25 BID at AL.   7. HLD continue zetia and crestor 8. Hx of seizures on dilantin.   For questions or updates, please contact [facility] Please consult [email or web address] for contact info under   Signed, Nada Boozer, NP  08/03/2018 10:30 AM  The patient was seen, examined and discussed with Nada Boozer, NP and I agree with the above.   63  y.o. male with a hx of left MCA CVA in 2010 with residual Rt hemiparesis, wheelchair bound since then, a resident of a nursing home, +PFO on coumadin and spontaneous rectus sheath hematoma, underlying hypercoagulable disorder with Protein C and S def. and + lupus anticoagulant, who was admitted with acute hypercapnic respiratory failure currently on BiPAP, believed to be secondary to acute CHF exacerbation, new diagnosis of atrial fibrillation, and acute metabolic encephalopathy believed to be secondary to hypercapnia. The patient states that he has been noticing lower extremity edema for the last 2 weeks, his right lower extremity is always more swollen as it is immobile, he was brought after his nurse at the nursing home noticed that he is more somnolent and weak. He was previously  seen by Dr. Excell Seltzer (last seen 2013) no closure of PFO - medication with anticoagulation only.    On physical exam he answers questions appropriately, he has difficult to assess JVD secondary to obesity, there are no significant crackles at his lungs, his heart rate is are irregular with no significant murmur, and has significant bilateral lower extremity edema that is not pitting.  Assessment and plan: Acute heart failure, unknown etiology, will obtain an echocardiogram, continue IV Lasix 80 mg IV twice daily, he has normal creatinine and electrolytes, so far good response however still significant fluid overload, I will add spironolactone 12.5 mg daily Acute respiratory failure with hypercapnia, continue BiPAP New onset atrial fibrillation that is rate controlled, continue anticoagulation with Coumadin, rate controlled with Lopressor Hyperlipidemia -continue Crestor and Zetia Hypertension-continue Lopressor, at low-dose spironolactone.  Tobias Alexander, MD 08/03/2018

## 2018-08-04 ENCOUNTER — Encounter (HOSPITAL_COMMUNITY): Payer: Self-pay | Admitting: Cardiology

## 2018-08-04 ENCOUNTER — Inpatient Hospital Stay (HOSPITAL_COMMUNITY): Payer: Medicare Other

## 2018-08-04 DIAGNOSIS — I351 Nonrheumatic aortic (valve) insufficiency: Secondary | ICD-10-CM

## 2018-08-04 DIAGNOSIS — I509 Heart failure, unspecified: Secondary | ICD-10-CM

## 2018-08-04 DIAGNOSIS — J9622 Acute and chronic respiratory failure with hypercapnia: Secondary | ICD-10-CM

## 2018-08-04 LAB — MAGNESIUM: Magnesium: 1.9 mg/dL (ref 1.7–2.4)

## 2018-08-04 LAB — COMPREHENSIVE METABOLIC PANEL
ALBUMIN: 3.5 g/dL (ref 3.5–5.0)
ALT: 18 U/L (ref 0–44)
AST: 21 U/L (ref 15–41)
Alkaline Phosphatase: 120 U/L (ref 38–126)
Anion gap: 7 (ref 5–15)
BUN: 13 mg/dL (ref 6–20)
CO2: 35 mmol/L — AB (ref 22–32)
Calcium: 8.8 mg/dL — ABNORMAL LOW (ref 8.9–10.3)
Chloride: 97 mmol/L — ABNORMAL LOW (ref 98–111)
Creatinine, Ser: 0.69 mg/dL (ref 0.61–1.24)
GFR calc Af Amer: >60 mL/min (ref >60)
GFR calc non Af Amer: >60 mL/min (ref >60)
Glucose, Bld: 118 mg/dL — ABNORMAL HIGH (ref 70–99)
Potassium: 3.5 mmol/L (ref 3.5–5.1)
Sodium: 139 mmol/L (ref 135–145)
Total Bilirubin: 0.8 mg/dL (ref 0.3–1.2)
Total Protein: 7 g/dL (ref 6.5–8.1)

## 2018-08-04 LAB — PROTIME-INR
INR: 2.7 — ABNORMAL HIGH (ref 0.8–1.2)
Prothrombin Time: 28.6 seconds — ABNORMAL HIGH (ref 11.4–15.2)

## 2018-08-04 LAB — BLOOD CULTURE ID PANEL (REFLEXED)

## 2018-08-04 LAB — CBC WITH DIFFERENTIAL/PLATELET
Abs Immature Granulocytes: 0.07 10*3/uL (ref 0.00–0.07)
BASOS ABS: 0.1 10*3/uL (ref 0.0–0.1)
Basophils Relative: 1 %
Eosinophils Absolute: 0.5 10*3/uL (ref 0.0–0.5)
Eosinophils Relative: 5 %
HCT: 47.9 % (ref 39.0–52.0)
Hemoglobin: 14.8 g/dL (ref 13.0–17.0)
Immature Granulocytes: 1 %
Lymphocytes Relative: 11 %
Lymphs Abs: 1.1 10*3/uL (ref 0.7–4.0)
MCH: 29.9 pg (ref 26.0–34.0)
MCHC: 30.9 g/dL (ref 30.0–36.0)
MCV: 96.8 fL (ref 80.0–100.0)
Monocytes Absolute: 1 10*3/uL (ref 0.1–1.0)
Monocytes Relative: 10 %
NRBC: 0 % (ref 0.0–0.2)
Neutro Abs: 6.8 10*3/uL (ref 1.7–7.7)
Neutrophils Relative %: 72 %
Platelets: 234 10*3/uL (ref 150–400)
RBC: 4.95 MIL/uL (ref 4.22–5.81)
RDW: 14 % (ref 11.5–15.5)
WBC: 9.4 10*3/uL (ref 4.0–10.5)

## 2018-08-04 LAB — AMMONIA: Ammonia: 58 umol/L — ABNORMAL HIGH (ref 9–35)

## 2018-08-04 LAB — VITAMIN B12: Vitamin B-12: 195 pg/mL (ref 180–914)

## 2018-08-04 LAB — ECHOCARDIOGRAM COMPLETE
HEIGHTINCHES: 73 in
Weight: 5376 oz

## 2018-08-04 LAB — TSH: TSH: 2.966 u[IU]/mL (ref 0.350–4.500)

## 2018-08-04 LAB — PHENYTOIN LEVEL, TOTAL: Phenytoin Lvl: 12 ug/mL (ref 10.0–20.0)

## 2018-08-04 MED ORDER — SPIRONOLACTONE 25 MG PO TABS
25.0000 mg | ORAL_TABLET | Freq: Every day | ORAL | Status: DC
  Administered 2018-08-05 – 2018-08-06 (×2): 25 mg via ORAL
  Filled 2018-08-04 (×3): qty 1

## 2018-08-04 MED ORDER — ZOLPIDEM TARTRATE 5 MG PO TABS
5.0000 mg | ORAL_TABLET | Freq: Every evening | ORAL | Status: DC | PRN
  Administered 2018-08-04: 5 mg via ORAL
  Filled 2018-08-04: qty 1

## 2018-08-04 MED ORDER — TRAZODONE HCL 50 MG PO TABS
50.0000 mg | ORAL_TABLET | Freq: Every evening | ORAL | Status: DC | PRN
  Administered 2018-08-05: 50 mg via ORAL
  Filled 2018-08-04: qty 1

## 2018-08-04 NOTE — Progress Notes (Signed)
ANTICOAGULATION CONSULT NOTE - Initial Consult  Pharmacy Consult for warfarin Indication: DVT, Lupus anticoagulant, PE  Allergies  Allergen Reactions  . Fenofibrate Other (See Comments)    Per MAR    Patient Measurements: Height: 6\' 1"  (185.4 cm) Weight: (!) 336 lb (152.4 kg) IBW/kg (Calculated) : 79.9 Heparin Dosing Weight:   Vital Signs: Temp: 98 F (36.7 C) (02/26 0800) Temp Source: Oral (02/26 0800) BP: 135/91 (02/26 1300) Pulse Rate: 88 (02/26 1300)  Labs: Recent Labs    08/02/18 1451 08/03/18 0311 08/04/18 0246  HGB 13.2 13.9 14.8  HCT 43.4 45.0 47.9  PLT 236 199 234  LABPROT 30.6* 32.4* 28.6*  INR 3.0 3.2* 2.7*  CREATININE 0.64 0.64 0.69  TROPONINI <0.03  --   --     Estimated Creatinine Clearance: 151.3 mL/min (by C-G formula based on SCr of 0.69 mg/dL).   Medical History: Past Medical History:  Diagnosis Date  . Anemia, secondary    SECONDARY TO ACUTE BLOOD LOSS  . Anxiety disorder   . Bloody stool 08/29/2011   intermittent along with constipation.   . 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 Depression   . 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 Dysphagia   . Hyperlipidemia   . Hypertension   . Lupus anticoagulant disorder (HCC) 1990  . Morbid obesity (HCC)   . PFO (patent foramen ovale)    TEE 2/10: EF 60%, trivial AI, mild Ao root dilatation, mod PFO with R-L shunting, atrial septal aneurysm;   echo 7/12: EF 65-70%, grade 1 diast dysfxn, mild MR, LVOT showed severe obstruction  . Protein C deficiency (HCC)   . Protein S deficiency (HCC)   . Rectus sheath hematoma 7/12   required reversal of anticoagulation and c/b DVT req. IVC filter  . Seizure disorder (HCC)     Medications:  Medications Prior to Admission  Medication Sig Dispense Refill Last Dose  . baclofen (LIORESAL) 20 MG tablet Take 20 mg by mouth 3 (three) times daily.      08/02/2018 at 1400  . Carboxymeth-Glycerin-Polysorb (REFRESH OPTIVE ADVANCED OP) Place 1 drop into both eyes 4 (four) times daily. Wait 3-5 minutes between eye meds   08/02/2018 at 1400  . ezetimibe (ZETIA) 10 MG tablet Take 10 mg by mouth daily.   08/02/2018 at 1000  . fish oil-omega-3 fatty acids 1000 MG capsule Take 1 g by mouth 2 (two) times daily.    08/02/2018 at 1000  . furosemide (LASIX) 20 MG tablet Take 20 mg by mouth daily.   08/02/2018 at 1000  . gabapentin (NEURONTIN) 300 MG capsule Take 300 mg by mouth 3 (three) times daily.     08/02/2018 at 1400  . Melatonin 3 MG TABS Take 3 mg by mouth at bedtime.   08/01/2018 at 2100  . metoprolol tartrate (LOPRESSOR) 25 MG tablet Take 25 mg by mouth 2 (two) times daily.    08/02/2018 at 1000  . omeprazole (PRILOSEC) 20 MG capsule Take 20 mg by mouth at bedtime.    08/01/2018 at 2100  . phenytoin (DILANTIN) 100 MG ER capsule Take 200 mg by mouth 2 (two) times daily.   08/02/2018 at 1000  . RESTASIS 0.05 % ophthalmic emulsion Place 1 drop into both eyes 2 (two) times daily.   08/02/2018 at 0900  . rosuvastatin (CRESTOR) 40 MG tablet Take 40 mg  by mouth at bedtime.    08/01/2018 at 2200  . Skin Protectants, Misc. (EUCERIN) cream Apply 1 application topically daily. Apply to areas of dry skin once daily   08/02/2018 at 0900  . traZODone (DESYREL) 50 MG tablet Take 50 mg by mouth at bedtime.   08/01/2018 at 2100  . venlafaxine (EFFEXOR) 75 MG tablet Take 75 mg by mouth daily.   08/02/2018 at 0900  . warfarin (COUMADIN) 2 MG tablet Take 2 mg by mouth See admin instructions. Take with 2.5 mg to equal 4.5 mg daily   08/01/2018 at 600 pm  . warfarin (COUMADIN) 2.5 MG tablet Take 2.5 mg by mouth See admin instructions. Take with 2.0 mg daily to equal 4.5 mg daily   08/01/2018 at 600 pm  . HYDROcodone-acetaminophen (NORCO/VICODIN) 5-325 MG per tablet Take one tablet by mouth every 12 hours as needed for pain (Patient not taking: Reported on 08/02/2018) 60 tablet 0 Not Taking  at Unknown time   Scheduled:  . chlorhexidine  15 mL Mouth Rinse BID  . cycloSPORINE  1 drop Both Eyes BID  . ezetimibe  10 mg Oral Daily  . furosemide  80 mg Intravenous Q12H  . mouth rinse  15 mL Mouth Rinse q12n4p  . metoprolol tartrate  25 mg Oral BID  . omega-3 acid ethyl esters  1 g Oral BID  . pantoprazole  40 mg Oral Daily  . phenytoin  200 mg Oral BID  . rosuvastatin  40 mg Oral QHS  . spironolactone  12.5 mg Oral Daily  . warfarin  4.5 mg Oral q1800  . Warfarin - Pharmacist Dosing Inpatient   Does not apply q1800    Assessment: 63 y/o M on chronic warfarin 4.5 mg daily for a h/o lupus anticoagulant and VTE admitted 2/2 AECHF.    Goal of Therapy:  INR 2.5-3.5   Plan:  Continue warfarin 4.5mg  po daily Daily INR  [name] D 08/04/2018,1:32 PM

## 2018-08-04 NOTE — Progress Notes (Signed)
Pt. Had 2.2 second pause. MD paged.

## 2018-08-04 NOTE — Progress Notes (Signed)
Pt arrived to unit from ICU on floor bed. VSS. Tele confirmed w/ CCMD. Pt denies pain/SOB/discomfort. Pt oriented to callbell and environment. POC discussed. Pt sitting up in bed, eating dinner.

## 2018-08-04 NOTE — Progress Notes (Signed)
PHARMACY - PHYSICIAN COMMUNICATION CRITICAL VALUE ALERT - BLOOD CULTURE IDENTIFICATION (BCID)  James Villegas is an 63 y.o. male who presented to [facility] on 08/02/2018 with a chief complaint of respiratory failure 2/2 CHF exacerbation   Assessment:  GPC in 1/4 bottles, CNS per BCID likely represents contaminant   Name of physician (or Provider) Contacted: Jerolyn Center  Current antibiotics: none  Changes to prescribed antibiotics recommended:  No abx recommended  Results for orders placed or performed during the hospital encounter of 08/02/18  Blood Culture ID Panel (Reflexed) (Collected: 08/02/2018  2:47 PM)  Result Value Ref Range   Enterococcus species NOT DETECTED NOT DETECTED   Listeria monocytogenes NOT DETECTED NOT DETECTED   Staphylococcus species DETECTED (A) NOT DETECTED   Staphylococcus aureus (BCID) NOT DETECTED NOT DETECTED   Methicillin resistance NOT DETECTED NOT DETECTED   Streptococcus species NOT DETECTED NOT DETECTED   Streptococcus agalactiae NOT DETECTED NOT DETECTED   Streptococcus pneumoniae NOT DETECTED NOT DETECTED   Streptococcus pyogenes NOT DETECTED NOT DETECTED   Acinetobacter baumannii NOT DETECTED NOT DETECTED   Enterobacteriaceae species NOT DETECTED NOT DETECTED   Enterobacter cloacae complex NOT DETECTED NOT DETECTED   Escherichia coli NOT DETECTED NOT DETECTED   Klebsiella oxytoca NOT DETECTED NOT DETECTED   Klebsiella pneumoniae NOT DETECTED NOT DETECTED   Proteus species NOT DETECTED NOT DETECTED   Serratia marcescens NOT DETECTED NOT DETECTED   Haemophilus influenzae NOT DETECTED NOT DETECTED   Neisseria meningitidis NOT DETECTED NOT DETECTED   Pseudomonas aeruginosa NOT DETECTED NOT DETECTED   Candida albicans NOT DETECTED NOT DETECTED   Candida glabrata NOT DETECTED NOT DETECTED   Candida krusei NOT DETECTED NOT DETECTED   Candida parapsilosis NOT DETECTED NOT DETECTED   Candida tropicalis NOT DETECTED NOT DETECTED    [name]  D 08/04/2018  1:30 PM

## 2018-08-04 NOTE — Progress Notes (Signed)
  Echocardiogram 2D Echocardiogram has been performed.  James Villegas 08/04/2018, 3:24 PM

## 2018-08-04 NOTE — Progress Notes (Addendum)
Progress Note  Patient Name: James Villegas Date of Encounter: 08/04/2018  Primary Cardiologist: last seen by Dr. Excell Seltzer 2013  Subjective   Denies any CP or SOB. Asked me to give a update to his sister James Villegas in [place].  Inpatient Medications    Scheduled Meds: . chlorhexidine  15 mL Mouth Rinse BID  . cycloSPORINE  1 drop Both Eyes BID  . ezetimibe  10 mg Oral Daily  . furosemide  80 mg Intravenous Q12H  . mouth rinse  15 mL Mouth Rinse q12n4p  . metoprolol tartrate  25 mg Oral BID  . omega-3 acid ethyl esters  1 g Oral BID  . pantoprazole  40 mg Oral Daily  . phenytoin  200 mg Oral BID  . rosuvastatin  40 mg Oral QHS  . spironolactone  12.5 mg Oral Daily  . warfarin  4.5 mg Oral q1800  . Warfarin - Pharmacist Dosing Inpatient   Does not apply q1800   Continuous Infusions:  PRN Meds: acetaminophen **OR** acetaminophen, ondansetron **OR** ondansetron (ZOFRAN) IV, zolpidem   Vital Signs    Vitals:   08/04/18 0100 08/04/18 0200 08/04/18 0400 08/04/18 0700  BP: (!) 151/112 124/75    Pulse: 98 76  97  Resp: (!) 21 19  16   Temp:   97.6 F (36.4 C)   TempSrc:   Oral   SpO2: 96% 94%  100%  Weight:      Height:        Intake/Output Summary (Last 24 hours) at 08/04/2018 0759 Last data filed at 08/04/2018 0043 Gross per 24 hour  Intake 480 ml  Output 7500 ml  Net -7020 ml   Last 3 Weights 08/02/2018 03/18/2014 02/23/2014  Weight (lbs) 336 lb 313 lb 295 lb  Weight (kg) 152.409 kg 141.976 kg 133.811 kg      Telemetry    Rate controlled atrial fibrillation - Personally Reviewed  ECG    Atrial fibrillation - Personally Reviewed  Physical Exam   GEN: No acute distress.   Neck: No JVD Cardiac: irregularly irregular, no murmurs, rubs, or gallops.  Respiratory: Clear to auscultation bilaterally. GI: Soft, nontender, non-distended  MS: No deformity. 2+ pitting edema Neuro:  R hemiparesis Psych: Normal affect   Labs    Chemistry Recent Labs  Lab  08/02/18 1451 08/03/18 0311 08/04/18 0246  NA 139 141 139  K 4.7 3.9 3.5  CL 102 100 97*  CO2 31 34* 35*  GLUCOSE 105* 91 118*  BUN 12 11 13   CREATININE 0.64 0.64 0.69  CALCIUM 8.6* 8.7* 8.8*  PROT 7.0  --  7.0  ALBUMIN 3.6  --  3.5  AST 20  --  21  ALT 19  --  18  ALKPHOS 112  --  120  BILITOT 0.4  --  0.8  GFRNONAA >60 >60 >60  GFRAA >60 >60 >60  ANIONGAP 6 7 7      Hematology Recent Labs  Lab 08/02/18 1451 08/03/18 0311 08/04/18 0246  WBC 8.7 8.2 9.4  RBC 4.41 4.61 4.95  HGB 13.2 13.9 14.8  HCT 43.4 45.0 47.9  MCV 98.4 97.6 96.8  MCH 29.9 30.2 29.9  MCHC 30.4 30.9 30.9  RDW 13.8 14.0 14.0  PLT 236 199 234    Cardiac Enzymes Recent Labs  Lab 08/02/18 1451  TROPONINI <0.03   No results for input(s): TROPIPOC in the last 168 hours.   BNP Recent Labs  Lab 08/02/18 1451  BNP 234.9*  DDimer No results for input(s): DDIMER in the last 168 hours.   Radiology    Dg Chest 1 View  Result Date: 08/03/2018 CLINICAL DATA:  Dyspnea EXAM: CHEST  1 VIEW COMPARISON:  08/25/2014 FINDINGS: Shallow inspiration. Cardiac enlargement. Mild vascular congestion. Perihilar infiltration, greater on the left, likely representing mild edema. Small bilateral pleural effusions. No pneumothorax. Mediastinal contours appear intact. IMPRESSION: Cardiac enlargement with mild pulmonary vascular congestion and perihilar edema. Small bilateral pleural effusions. Electronically Signed   By: Burman Nieves M.D.   On: 08/03/2018 00:10    Cardiac Studies   Pending echo  Patient Profile     63 y.o. 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 EKG showed new atrial flutter and RBBB. Cardiology consulted for CHF  Assessment & Plan    1. Atrial flutter/afib of unknown duration  - good diuresis,  however still appears to be volume overloaded. Continue diuresis  - pending echocardiogram  - diureses with IV lasix for one more day. May consider switching to oral diuretic in 24-48 hours  2. Acute CHF: likely contribute by weight and loss of atrial kick during atrial fibrillation  - rate controlled on metoprolol, continue coumadin  3. Acute respiratory failure  - on nasal cannula  4. AMS: EEG on 08/03/2018 showed no seizure episode.   5. Hypercoagulable disorder with protein C and S deficiency: h/o CVA and PE, on coumadin  6. PFO: no plan to close, medically manage  7. HTN: stable  8. HLD: on crestor and zetia  9. H/o Seizure    For questions or updates, please contact [facility] Please consult [email or web address] for contact info under     Signed, Azalee Course, PA  08/04/2018, 7:59 AM    The patient was seen, examined and discussed with Azalee Course, PA-C and I agree with the above.   63 y.o. male with a hx of left MCA CVA in 2010 with residual Rt hemiparesis, wheelchair bound since then, a resident of a nursing home, +PFO on coumadin, hypercoagulable disorder with Protein C and S def. and + lupus anticoagulant, who was admitted with acute hypercapnic respiratory failure currently on BiPAP, believed to be secondary to acute CHF exacerbation, new diagnosis of atrial fibrillation, and acute metabolic encephalopathy believed to be secondary to hypercapnia. Acute heart failure, unknown etiology, echo is pending , he diuresed well -7 L overnight, stable Crea 0.64, I would continue IV Lasix 80 mg IV twice daily and increase spironolactone to 25 mg daily Acute respiratory failure with hypercapnia, continue BiPAP. New onset atrial fibrillation that is rate controlled, continue anticoagulation with Coumadin, rate controlled with Lopressor. We will continue rate control for now, unless low LVEF. With immobility and morbid obesity he would be at high risk for recurrent a-fib.  Hyperlipidemia  -continue Crestor and Zetia Hypertension-improved.   Tobias Alexander, MD 08/04/2018

## 2018-08-04 NOTE — Progress Notes (Signed)
PROGRESS NOTE    James Villegas  ZOX:[id number] DOB: 1955/12/07 DOA: 08/02/2018 PCP: [name], MD  Brief Narrative:63 year old male with history of left MCA CVA with right hemiparesis, lupus anticoagulant disorder, right lower extremity DVT status post IVC filter, bilateral pulmonary involvement, morbid obesity, protein C and protein S deficiency, patent foreman ovale, rectus sheath hematoma, depression, anxiety, hypertension and dyslipidemia presented with somnolence on 08/02/2018.  He was found to be in acute hypercapnic respiratory failure for which he required noninvasive mechanical ventilation and IV Lasix.   Assessment & Plan:   Active Problems:   Cerebral infarction (HCC)   DVT (deep venous thrombosis) (HCC)   Essential hypertension, benign   Respiratory failure (HCC)   New onset atrial fibrillation (HCC)   Chronic anticoagulation  #1 acute hypercapnic respiratory failure secondary to acute CHF exacerbation.  Patient was treated with BiPAP and IV diuresis on admission.  Staff reports and patient reports he did not have to use the BiPAP overnight.  This morning he is awake and alert.  He asked me to call his sister who is his healthcare power of attorney in [place].  Follow-up echo.  Neurology following.  Currently on 2 L of oxygen by nasal cannula.  He is negative by 11 L since admit.  Diuresis per cardiology.?Undiagnosed Sleep apnea.  Upon discharge will have [facility] nursing facility schedule him for sleep study.  #2 new onset atrial fibrillation/PFO  Patient was already anticoagulated with Coumadin for hypercoagulable state.  Rate controlled on metoprolol.  Continue anticoagulation with Coumadin.  INR therapeutic at 2.7.  PFO to be managed medically.  #3 hypertension stable on metoprolol  #4 hyperlipidemia continue Crestor Zetia and Lovaza  #5 history of  stroke with residual right-sided weakness continue Coumadin and Crestor.  #6 acute metabolic encephalopathy  improving secondary to #1.  EEG ordered at the time of admission showed no evidence of seizure activity.  #7 protein C and protein S deficiency with history of stroke and PE on Coumadin.  #8 history of seizure disorder level 12.0.  TSH 2.96.  Continue Dilantin   Estimated body mass index is 44.33 kg/m as calculated from the following:   Height as of this encounter:  (1.854 m).   Weight as of this encounter: 152.4 kg.  DVT prophylaxis: Coumadin Code Status: Full code Family Communication: Discussed with Sister [name] Disposition Plan: Pending clinical improvement hello  Consultants:   CARDIOLOGY  Procedures: None Antimicrobials: None  Subjective: Patient is awake alert staff reports he didn't had to use BIPAP overnight  Objective: Vitals:   08/04/18 0700 08/04/18 0800 08/04/18 0900 08/04/18 1000  BP:   (!) 158/101 134/86  Pulse: 97 89 84 98  Resp: 16 (!) 21 (!) 26 (!) 25  Temp:  98 F (36.7 C)    TempSrc:  Oral    SpO2: 100% 96% 96% 95%  Weight:      Height:        Intake/Output Summary (Last 24 hours) at 08/04/2018 1055 Last data filed at 08/04/2018 0600 Gross per 24 hour  Intake 480 ml  Output 7025 ml  Net -6545 ml   Filed Weights   08/02/18 1417  Weight: (!) 152.4 kg    Examination:  General exam: Appears calm and comfortable  Respiratory system: decreased breath sounds at basesto auscultation. Respiratory effort normal. Cardiovascular system: S1 & S2 heard, RRR. No JVD, murmurs, rubs, gallops or clicks. No pedal edema. Gastrointestinal system: Abdomen is nondistended, soft and nontender. No  organomegaly or masses felt. Normal bowel sounds heard. Central nervous system: Alert and oriented. No focal neurological deficits. Extremities: Symmetric 5 x 5 power. Skin: No rashes, lesions or ulcers Psychiatry: Judgement and insight appear normal. Mood & affect appropriate.     Data Reviewed: I have personally reviewed following labs and imaging  studies  CBC: Recent Labs  Lab 08/02/18 1451 08/03/18 0311 08/04/18 0246  WBC 8.7 8.2 9.4  NEUTROABS 6.6  --  6.8  HGB 13.2 13.9 14.8  HCT 43.4 45.0 47.9  MCV 98.4 97.6 96.8  PLT 236 199 234   Basic Metabolic Panel: Recent Labs  Lab 08/02/18 1451 08/03/18 0311 08/04/18 0246  NA 139 141 139  K 4.7 3.9 3.5  CL 102 100 97*  CO2 31 34* 35*  GLUCOSE 105* 91 118*  BUN 12 11 13   CREATININE 0.64 0.64 0.69  CALCIUM 8.6* 8.7* 8.8*  MG  --   --  1.9   GFR: Estimated Creatinine Clearance: 151.3 mL/min (by C-G formula based on SCr of 0.69 mg/dL). Liver Function Tests: Recent Labs  Lab 08/02/18 1451 08/04/18 0246  AST 20 21  ALT 19 18  ALKPHOS 112 120  BILITOT 0.4 0.8  PROT 7.0 7.0  ALBUMIN 3.6 3.5   No results for input(s): LIPASE, AMYLASE in the last 168 hours. Recent Labs  Lab 08/04/18 0246  AMMONIA 58*   Coagulation Profile: Recent Labs  Lab 08/02/18 1451 08/03/18 0311 08/04/18 0246  INR 3.0 3.2* 2.7*   Cardiac Enzymes: Recent Labs  Lab 08/02/18 1451  TROPONINI <0.03   BNP (last 3 results) No results for input(s): PROBNP in the last 8760 hours. HbA1C: No results for input(s): HGBA1C in the last 72 hours. CBG: No results for input(s): GLUCAP in the last 168 hours. Lipid Profile: No results for input(s): CHOL, HDL, LDLCALC, TRIG, CHOLHDL, LDLDIRECT in the last 72 hours. Thyroid Function Tests: Recent Labs    08/04/18 0246  TSH 2.966   Anemia Panel: Recent Labs    08/04/18 0246  VITAMINB12 195   Sepsis Labs: Recent Labs  Lab 08/02/18 1451  LATICACIDVEN 1.0    Recent Results (from the past 240 hour(s))  Culture, blood (routine x 2)     Status: None (Preliminary result)   Collection Time: 08/02/18  2:47 PM  Result Value Ref Range Status   Specimen Description   Final    BLOOD LEFT ANTECUBITAL Performed at [facility], [address], [place], [place] [address]    Special Requests   Final    BOTTLES DRAWN AEROBIC  AND ANAEROBIC Blood Culture adequate volume   Culture   Final    NO GROWTH 2 DAYS Performed at [facility], [address] [place], [place] [address]    Report Status PENDING  Incomplete  Culture, blood (routine x 2)     Status: None (Preliminary result)   Collection Time: 08/02/18  2:51 PM  Result Value Ref Range Status   Specimen Description   Final    BLOOD LEFT WRIST Performed at [facility], [address] [place], [place] [address]    Special Requests   Final    BOTTLES DRAWN AEROBIC ONLY Blood Culture results may not be optimal due to an inadequate volume of blood received in culture bottles Performed at [facility], [address], [place], [place] [address]    Culture   Final    NO GROWTH 2 DAYS Performed at  [facility], [address], [place], [place] [address]    Report Status PENDING  Incomplete  MRSA PCR Screening     Status: None   Collection Time: 08/02/18 10:00 PM  Result Value Ref Range Status   MRSA by PCR NEGATIVE NEGATIVE Final    Comment:        The GeneXpert MRSA Assay (FDA approved for NASAL specimens only), is one component of a comprehensive MRSA colonization surveillance program. It is not intended to diagnose MRSA infection nor to guide or monitor treatment for MRSA infections. Performed at [facility], [address] [place], [place] [address]          Radiology Studies: Dg Chest 1 View  Result Date: 08/03/2018 CLINICAL DATA:  Dyspnea EXAM: CHEST  1 VIEW COMPARISON:  08/25/2014 FINDINGS: Shallow inspiration. Cardiac enlargement. Mild vascular congestion. Perihilar infiltration, greater on the left, likely representing mild edema. Small bilateral pleural effusions. No pneumothorax. Mediastinal contours appear intact. IMPRESSION: Cardiac enlargement with mild pulmonary vascular congestion and perihilar edema. Small bilateral pleural effusions. Electronically Signed    By: Burman Nieves M.D.   On: 08/03/2018 00:10        Scheduled Meds: . chlorhexidine  15 mL Mouth Rinse BID  . cycloSPORINE  1 drop Both Eyes BID  . ezetimibe  10 mg Oral Daily  . furosemide  80 mg Intravenous Q12H  . mouth rinse  15 mL Mouth Rinse q12n4p  . metoprolol tartrate  25 mg Oral BID  . omega-3 acid ethyl esters  1 g Oral BID  . pantoprazole  40 mg Oral Daily  . phenytoin  200 mg Oral BID  . rosuvastatin  40 mg Oral QHS  . spironolactone  12.5 mg Oral Daily  . warfarin  4.5 mg Oral q1800  . Warfarin - Pharmacist Dosing Inpatient   Does not apply q1800   Continuous Infusions:   LOS: 2 days     Alwyn Ren, MD [facility]  If 7PM-7AM, please contact night-coverage [email or web address] Password [email or web address] 08/04/2018, 10:55 AM

## 2018-08-05 ENCOUNTER — Telehealth: Payer: Self-pay | Admitting: Cardiology

## 2018-08-05 DIAGNOSIS — I5033 Acute on chronic diastolic (congestive) heart failure: Secondary | ICD-10-CM

## 2018-08-05 LAB — LIPID PANEL
Cholesterol: 194 mg/dL (ref 0–200)
HDL: 51 mg/dL (ref >40)
LDL Cholesterol: 116 mg/dL — ABNORMAL HIGH (ref 0–99)
TRIGLYCERIDES: 137 mg/dL (ref <150)
Total CHOL/HDL Ratio: 3.8 RATIO
VLDL: 27 mg/dL (ref 0–40)

## 2018-08-05 LAB — PROTIME-INR
INR: 2 — ABNORMAL HIGH (ref 0.8–1.2)
Prothrombin Time: 22 seconds — ABNORMAL HIGH (ref 11.4–15.2)

## 2018-08-05 LAB — FOLATE RBC
Folate, Hemolysate: 388 ng/mL
Folate, RBC: 855 ng/mL (ref >498)
Hematocrit: 45.4 % (ref 37.5–51.0)

## 2018-08-05 MED ORDER — WARFARIN SODIUM 6 MG PO TABS
6.0000 mg | ORAL_TABLET | Freq: Once | ORAL | Status: AC
  Administered 2018-08-05: 6 mg via ORAL
  Filled 2018-08-05: qty 1

## 2018-08-05 NOTE — Clinical Social Work Note (Signed)
Clinical Social Work Assessment  Patient Details  Name: James Villegas MRN: [id number] Date of Birth: [birth year]  Date of referral:  08/05/18               Reason for consult:  (admitted from facility)                Permission sought to share information with:  Facility Industrial/product designer granted to share information::  Yes, Verbal Permission Granted  Name::        Agency::  [facility]  Relationship::     Contact Information:     Housing/Transportation Living arrangements for the past 2 months:  Skilled Building surveyor of Information:  Facility Patient Interpreter Needed:  None Criminal Activity/Legal Involvement Pertinent to Current Situation/Hospitalization:  No - Comment as needed Significant Relationships:  Merchandiser, retail, Friend Lives with:  Facility Resident Do you feel safe going back to the place where you live?  Yes Need for family participation in patient care:  No (Coment)  Care giving concerns:  Pt admitted from [facility] where he is a long term care resident. Hx of CVA, uses wheelchair and needs assistance with ADLs at baseline. Admitted for CHF exacerbation.   Social Worker assessment / plan:  CSW consulted to assist with disposition as pt is resident of SNF. Pt planning to return to [facility] at DC- confirmed with facility and completed FL2. Will coordinate with facility at DC.  Employment status:  Disabled (Comment on whether or not currently receiving Disability)(recieves disability) Insurance information:  Medicare PT Recommendations:  Skilled Nursing Facility Information / Referral to community resources:     Patient/Family's Response to care:  appreciative  Patient/Family's Understanding of and Emotional Response to Diagnosis, Current Treatment, and Prognosis:  Did not discuss treatment however understanding about DC planning  Emotional Assessment Appearance:  Appears stated age Attitude/Demeanor/Rapport:   (appropriate) Affect (typically observed):  Calm Orientation:  Oriented to Self, Oriented to Place, Oriented to  Time, Oriented to Situation Alcohol / Substance use:  Not Applicable Psych involvement (Current and /or in the community):  No (Comment)  Discharge Needs  Concerns to be addressed:  Care Coordination Readmission within the last 30 days:  No Current discharge risk:  None Barriers to Discharge:  Continued Medical Work up   [employer], LCSW 08/05/2018, 2:59 PM [phone]

## 2018-08-05 NOTE — Plan of Care (Signed)
  Problem: Education: Goal: Knowledge of General Education information will improve Description Including pain rating scale, medication(s)/side effects and non-pharmacologic comfort measures Outcome: Progressing   Problem: Health Behavior/Discharge Planning: Goal: Ability to manage health-related needs will improve Outcome: Progressing Note:  Will return to [employer]   Problem: Clinical Measurements: Goal: Ability to maintain clinical measurements within normal limits will improve Outcome: Progressing Goal: Diagnostic test results will improve Outcome: Progressing Goal: Respiratory complications will improve Outcome: Progressing Goal: Cardiovascular complication will be avoided Outcome: Progressing Note:  IV lasix BID   Problem: Activity: Goal: Risk for activity intolerance will decrease Outcome: Progressing   Problem: Elimination: Goal: Will not experience complications related to bowel motility Outcome: Progressing Goal: Will not experience complications related to urinary retention Outcome: Progressing   Problem: Safety: Goal: Ability to remain free from injury will improve Outcome: Progressing   Problem: Skin Integrity: Goal: Risk for impaired skin integrity will decrease Outcome: Progressing

## 2018-08-05 NOTE — [place] FL2 (Signed)
[place] MEDICAID FL2 LEVEL OF CARE SCREENING TOOL     IDENTIFICATION  Patient Name: James Villegas Birthdate: Dec 04, 1955 Sex: male Admission Date (Current Location): 08/02/2018  [facility] and [place] Number:  Producer, television/film/video and Address:  [facility],  [address] [place], [place] [address]      Provider Number: [id number]  Attending Physician Name and Address:  Alwyn Ren, MD  Relative Name and Phone Number:       Current Level of Care: Hospital Recommended Level of Care: Skilled Nursing Facility Prior Approval Number:    Date Approved/Denied:   PASRR Number:    Discharge Plan: SNF    Current Diagnoses: Patient Active Problem List   Diagnosis Date Noted  . Acute on chronic congestive heart failure (HCC)   . New onset atrial fibrillation (HCC)   . Chronic anticoagulation   . Respiratory failure (HCC) 08/02/2018  . Anemia of other chronic disease 11/23/2013  . Pain in joint, ankle and foot 11/14/2013  . Other convulsions 10/19/2013  . Noninfectious gastroenteritis and colitis 07/29/2013  . Left upper quadrant abdominal mass 07/28/2013  . Other specified disease of white blood cells 07/16/2013  . Ingrowing toenail with infection 05/20/2013  . Late effects of cerebrovascular disease 10/15/2012  . Pure hypercholesterolemia 10/15/2012  . Essential hypertension, benign 10/15/2012  . Hereditary and idiopathic peripheral neuropathy 10/15/2012  . 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 Cerebral infarction (HCC) 08/29/2011  . DVT (deep venous thrombosis) (HCC) 08/29/2011  . Bloody stool 08/29/2011  . PFO (patent foramen ovale) 02/18/2011    Orientation RESPIRATION BLADDER Height & Weight     Self, Time, Situation, Place  O2(2L) Continent, External catheter Weight: (!) 305 lb 5.4 oz (138.5 kg) Height:  6\' 1"  (185.4 cm)  BEHAVIORAL SYMPTOMS/MOOD NEUROLOGICAL BOWEL NUTRITION STATUS      Continent  Diet(regular)  AMBULATORY STATUS COMMUNICATION OF NEEDS Skin   Extensive Assist(uses wheelchair) Verbally Normal                       Personal Care Assistance Level of Assistance  Bathing, Feeding, Dressing Bathing Assistance: Maximum assistance Feeding assistance: Independent Dressing Assistance: Maximum assistance     Functional Limitations Info  Sight, Hearing, Speech Sight Info: Adequate Hearing Info: Adequate Speech Info: Adequate    SPECIAL CARE FACTORS FREQUENCY  PT (By licensed PT)     PT Frequency: 5x              Contractures Contractures Info: Not present    Additional Factors Info  Code Status, Allergies Code Status Info: DNR Allergies Info: fenofibrate           Current Medications (08/05/2018):  This is the current hospital active medication list Current Facility-Administered Medications  Medication Dose Route Frequency Provider Last Rate Last Dose  . acetaminophen (TYLENOL) tablet 650 mg  650 mg Oral Q6H PRN Arrien, York Ram, MD       Or  . acetaminophen (TYLENOL) suppository 650 mg  650 mg Rectal Q6H PRN Arrien, York Ram, MD      . chlorhexidine (PERIDEX) 0.12 % solution 15 mL  15 mL Mouth Rinse BID Glade Lloyd, MD   15 mL at 08/05/18 0851  . cycloSPORINE (RESTASIS) 0.05 % ophthalmic emulsion 1 drop  1 drop Both Eyes BID Arrien, York Ram, MD   1 drop at 08/05/18 0856  . ezetimibe (ZETIA) tablet 10 mg  10  mg Oral Daily Arrien, York Ram, MD   10 mg at 08/05/18 0851  . furosemide (LASIX) injection 80 mg  80 mg Intravenous Q12H Glade Lloyd, MD   80 mg at 08/05/18 0851  . MEDLINE mouth rinse  15 mL Mouth Rinse q12n4p Hanley Ben, Kshitiz, MD   15 mL at 08/04/18 1554  . metoprolol tartrate (LOPRESSOR) tablet 25 mg  25 mg Oral BID Coralie Keens, MD   25 mg at 08/05/18 0851  . omega-3 acid ethyl esters (LOVAZA) capsule 1 g  1 g Oral BID Arrien, York Ram, MD   1 g at 08/05/18 [phone]  . ondansetron (ZOFRAN)  tablet 4 mg  4 mg Oral Q6H PRN Arrien, York Ram, MD       Or  . ondansetron [facility]) injection 4 mg  4 mg Intravenous Q6H PRN Arrien, York Ram, MD      . pantoprazole (PROTONIX) EC tablet 40 mg  40 mg Oral Daily Arrien, York Ram, MD   40 mg at 08/05/18 [id number]  . phenytoin (DILANTIN) ER capsule 200 mg  200 mg Oral BID Coralie Keens, MD   200 mg at 08/05/18 0851  . rosuvastatin (CRESTOR) tablet 40 mg  40 mg Oral QHS Arrien, York Ram, MD   40 mg at 08/04/18 2135  . spironolactone (ALDACTONE) tablet 25 mg  25 mg Oral Daily Lars Masson, MD   25 mg at 08/05/18 0853  . traZODone (DESYREL) tablet 50 mg  50 mg Oral QHS PRN Alwyn Ren, MD   50 mg at 08/05/18 0149  . warfarin (COUMADIN) tablet 6 mg  6 mg Oral ONCE-1800 Pham, Anh P, RPH      . Warfarin - Pharmacist Dosing Inpatient   Does not apply q1800 Arrien, York Ram, MD         Discharge Medications: Please see discharge summary for a list of discharge medications.  Relevant Imaging Results:  Relevant Lab Results:   Additional Information SS# [id number]  Nelwyn Salisbury, LCSW

## 2018-08-05 NOTE — Care Management Important Message (Signed)
Important Message  Patient Details  Name: James Villegas MRN: [id number] Date of Birth: 1956/02/18   Medicare Important Message Given:  Yes    Caren Macadam 08/05/2018, 11:53 AMImportant Message  Patient Details  Name: James Villegas MRN: [id number] Date of Birth: [birth year]   Medicare Important Message Given:  Yes    Caren Macadam 08/05/2018, 11:53 AM

## 2018-08-05 NOTE — Progress Notes (Signed)
ANTICOAGULATION CONSULT NOTE - Follow Up Consult  Pharmacy Consult for warfarin Indication: hx pulmonary embolus and DVT, CVA, lupus anticoagulant  Allergies  Allergen Reactions  . Fenofibrate Other (See Comments)    Per [facility]    Patient Measurements: Height: 6\' 1"  (185.4 cm) Weight: (!) 305 lb 5.4 oz (138.5 kg) IBW/kg (Calculated) : 79.9 Heparin Dosing Weight:   Vital Signs: Temp: 97.7 F (36.5 C) (02/27 0623) Temp Source: Oral (02/27 0623) BP: 131/81 (02/27 0623) Pulse Rate: 90 (02/27 0623)  Labs: Recent Labs    08/02/18 1451 08/03/18 0311 08/04/18 0246 08/05/18 0401  HGB 13.2 13.9 14.8  --   HCT 43.4 45.0 47.9  --   PLT 236 199 234  --   LABPROT 30.6* 32.4* 28.6* 22.0*  INR 3.0 3.2* 2.7* 2.0*  CREATININE 0.64 0.64 0.69  --   TROPONINI <0.03  --   --   --     Estimated Creatinine Clearance: 143.5 mL/min (by C-G formula based on SCr of 0.69 mg/dL).   Medications:  PTA warfarin regimen: 4.5 mg daily  Assessment: Patient's a 63 y.o M with hx PE/DVT (s/p IVC filter), CVA, protein C&S deficiency and lupus anticoagulant on warfarin PTA presented to the ED from nursing facility on 2/24 with SOB and AMS. Patient was also found to have new onset afib.  Warfarin resumed on admission.  Today, 08/05/2018: - INR is trending down and is subtherapeutic at 2 (all warfarin doses charted) - last cbc on 2/26 ok - on regular diet - no significant drug-drug intxns  Goal of Therapy:  INR goal 2.3-3.5 Monitor platelets by anticoagulation protocol: Yes   Plan:  - increase warfarin dose to 6 mg PO x1 - monitor for s/s bleeding  Catherin Doorn P 08/05/2018,10:52 AM

## 2018-08-05 NOTE — Telephone Encounter (Signed)
  TOC appt per Lizabeth Leyden on 08/18/18 @ 9:00 am with [facility]

## 2018-08-05 NOTE — Progress Notes (Signed)
Received call from central telemetry stating that pt had 2.5 sec pause.  Checked on pt, asymptomatic, VSS, denies any chest pain or discomfort.   Kirby,NP notified. Awaiting any new orders.

## 2018-08-05 NOTE — Discharge Instructions (Signed)
Do the following things EVERY DAY: °  °1. Weigh yourself EVERY morning after you go to the bathroom but before you eat or drink anything. Write this number down in a weight log/diary. If you gain 3 pounds overnight or 5 pounds in a week, call the office. °  °2. Take your medicines as prescribed. If you have concerns about your medications, please call us before you stop taking them.  °  °3. Eat low salt foods--Limit salt (sodium) to 2000 mg per day. This will help prevent your body from holding onto fluid. Read food labels as many processed foods have a lot of sodium, especially canned goods and prepackaged meats. If you would like some assistance choosing low sodium foods, we would be happy to set you up with a nutritionist. °  °4. Stay as active as you can everyday. Staying active will give you more energy and make your muscles stronger. Start with 5 minutes at a time and work your way up to 30 minutes a day. Break up your activities--do some in the morning and some in the afternoon. Start with 3 days per week and work your way up to 5 days as you can.  If you have chest pain, feel short of breath, dizzy, or lightheaded, STOP. If you don't feel better after a short rest, call 911. If you do feel better, call the office to let us know you have symptoms with exercise. °  °5. Limit all fluids for the day to less than 2 liters. Fluid includes all drinks, coffee, juice, ice chips, soup, jello, and all other liquids. °  °

## 2018-08-05 NOTE — Progress Notes (Signed)
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 He was found to be in acute hypercapnic respiratory failure for which he required noninvasive mechanical ventilation and IV Lasix.  Assessment & Plan:   Active Problems:   Cerebral infarction (HCC)   DVT (deep venous thrombosis) (HCC)   Essential hypertension, benign   Respiratory failure (HCC)   New onset atrial fibrillation (HCC)   Chronic anticoagulation   Acute on chronic congestive heart failure (HCC)   #1 acute hypercapnic respiratory failure secondary to acute CHF exacerbation.  Patient was treated with BiPAP and IV diuresis on admission.  On IV Lasix 80 mg twice a day.  He is negative by almost 17 L!!   Follow-up echo shows normal ejection fraction.   Cardiology following.  Currently on 2 L of oxygen by nasal cannula.?Undiagnosed Sleep apnea.  Upon discharge will have [facility] nursing facility schedule him for sleep study.  Patient is still fluid overloaded and needs IV diuresis and needs hospital stay.  #2 new onset paroxysmal atrial fibrillation/PFO  Patient was already anticoagulated with Coumadin for hypercoagulable state.  Rate controlled on metoprolol.  Continue anticoagulation with Coumadin.  INR therapeutic at 2.7.  PFO to be managed medically.  #3 hypertension stable on metoprolol  #4 hyperlipidemia continue Crestor Zetia and Lovaza  #5 history of  stroke with residual right-sided weakness continue Coumadin and Crestor.  #6 acute metabolic encephalopathy improving secondary  to #1.  EEG ordered at the time of admission showed no evidence of seizure activity.  #7 protein C and protein S deficiency with history of stroke and PE on Coumadin  #8 history of seizure disorder level 12.0.  TSH 2.96.  Continue Dilantin   Estimated body mass index is 40.28 kg/m as calculated from the following:   Height as of this encounter: 6\' 1"  (1.854 m).   Weight as of this encounter: 138.5 kg.  DVT prophylaxis: Coumadin Code Status: Full code  family Communication: Discussed with Sister [name] Disposition Plan: Pending further diuresis hopefully discharge in the next 24 to 48 hours  Consultants:   Cardiology  Procedures: None Antimicrobials none  Subjective: Resting in bed feels breathing is better but still short of breath  Objective: Vitals:   08/04/18 2219 08/05/18 0046 08/05/18 0623 08/05/18 1054  BP: 120/84 119/77 131/81 106/64  Pulse: 67 78 90 (!) 50  Resp: 18 20 20    Temp: 98.2 F (36.8 C) 97.6 F (36.4 C) 97.7 F (36.5 C) (!) 97.5 F (36.4 C)  TempSrc: Oral Oral Oral Oral  SpO2: 96% 96% 98%   Weight:   (!) 138.5 kg   Height:        Intake/Output Summary (Last 24 hours) at 08/05/2018 1153 Last data filed at 08/05/2018 1100 Gross per 24 hour  Intake 840 ml  Output 6585 ml  Net -5745 ml   Filed Weights   08/02/18 1417 08/04/18 1720 08/05/18 0623  Weight: (!) 152.4 kg (!) 140.7 kg (!) 138.5 kg    Examination:  General exam: Appears calm and comfortable  Respiratory system: Clear to auscultation. Respiratory effort normal. Cardiovascular system: S1 & S2 heard, RRR.  No JVD, murmurs, rubs, gallops or clicks. No pedal edema. Gastrointestinal system: Abdomen is nondistended, soft and nontender. No organomegaly or masses felt. Normal bowel sounds heard. Central nervous system: Alert and oriented. No focal neurological deficits. Extremities: Symmetric 5 x 5 power. Skin: No rashes, lesions or ulcers Psychiatry: Judgement and insight appear normal.  Mood & affect appropriate.     Data Reviewed: I have personally reviewed following labs and imaging studies  CBC: Recent Labs  Lab 08/02/18 1451 08/03/18 0311 08/04/18 0246  WBC 8.7 8.2 9.4  NEUTROABS 6.6  --  6.8  HGB 13.2 13.9 14.8  HCT 43.4 45.0 47.9  MCV 98.4 97.6 96.8  PLT 236 199 234   Basic Metabolic Panel: Recent Labs  Lab 08/02/18 1451 08/03/18 0311 08/04/18 0246  NA 139 141 139  K 4.7 3.9 3.5  CL 102 100 97*  CO2 31 34* 35*  GLUCOSE 105* 91 118*  BUN CREATININE 0.64 0.64 0.69  CALCIUM 8.6* 8.7* 8.8*  MG  --   --  1.9   GFR: Estimated Creatinine Clearance: 143.5 mL/min (by C-G formula based on SCr of 0.69 mg/dL). Liver Function Tests: Recent Labs  Lab 08/02/18 1451 08/04/18 0246  AST 20 21  ALT 19 18  ALKPHOS 112 120  BILITOT 0.4 0.8  PROT 7.0 7.0  ALBUMIN 3.6 3.5   No results for input(s): LIPASE, AMYLASE in the last 168 hours. Recent Labs  Lab 08/04/18 0246  AMMONIA 58*   Coagulation Profile: Recent Labs  Lab 08/02/18 1451 08/03/18 0311 08/04/18 0246 08/05/18 0401  INR 3.0 3.2* 2.7* 2.0*   Cardiac Enzymes: Recent Labs  Lab 08/02/18 1451  TROPONINI <0.03   BNP (last 3 results) No results for input(s): PROBNP in the last 8760 hours. HbA1C: No results for input(s): HGBA1C in the last 72 hours. CBG: No results for input(s): GLUCAP in the last 168 hours. Lipid Profile: Recent Labs    08/05/18 0401  CHOL 194  HDL 51  LDLCALC 116*  TRIG 137  CHOLHDL 3.8   Thyroid Function Tests: Recent Labs    08/04/18 0246  TSH 2.966   Anemia Panel: Recent Labs    08/04/18 0246  VITAMINB12 195   Sepsis Labs: Recent Labs  Lab 08/02/18 1451  LATICACIDVEN 1.0    Recent Results (from the past 240 hour(s))  Culture, blood (routine x 2)     Status: Abnormal (Preliminary result)   Collection Time: 08/02/18  2:47 PM  Result Value Ref Range Status   Specimen Description   Final    BLOOD LEFT ANTECUBITAL Performed at  [facility], [address], [place], [place] [address]    Special Requests   Final    BOTTLES DRAWN AEROBIC AND ANAEROBIC Blood Culture adequate volume   Culture  Setup Time   Final    GRAM POSITIVE COCCI IN CLUSTERS AEROBIC BOTTLE ONLY CRITICAL RESULT CALLED TO, READ BACK BY AND VERIFIED WITH: Fenton Foy PharmD 13:05 08/04/18 ([email or web address])    Culture (A)  Final    STAPHYLOCOCCUS SPECIES (COAGULASE NEGATIVE) THE SIGNIFICANCE OF ISOLATING THIS ORGANISM FROM A SINGLE SET OF BLOOD CULTURES WHEN MULTIPLE SETS ARE DRAWN IS UNCERTAIN. PLEASE NOTIFY THE MICROBIOLOGY DEPARTMENT WITHIN ONE WEEK IF SPECIATION AND SENSITIVITIES ARE REQUIRED. Performed at [facility], [address] [place], [place] [address]    Report Status PENDING  Incomplete  Blood Culture ID Panel (Reflexed)     Status: Abnormal  Collection Time: 08/02/18  2:47 PM  Result Value Ref Range Status   Enterococcus species NOT DETECTED NOT DETECTED Final   Listeria monocytogenes NOT DETECTED NOT DETECTED Final   Staphylococcus species DETECTED (A) NOT DETECTED Final    Comment: Methicillin (oxacillin) susceptible coagulase negative staphylococcus. Possible blood culture contaminant (unless isolated from more than one blood culture draw or clinical case suggests pathogenicity). No antibiotic treatment is indicated for blood  culture contaminants. CRITICAL RESULT CALLED TO, READ BACK BY AND VERIFIED WITH: Fenton FoyA. Pham PharmD 13:05 08/04/18 ([email or web address])    Staphylococcus aureus (BCID) NOT DETECTED NOT DETECTED Final   Methicillin resistance NOT DETECTED NOT DETECTED Final   Streptococcus species NOT DETECTED NOT DETECTED Final   Streptococcus agalactiae NOT DETECTED NOT DETECTED Final   Streptococcus pneumoniae NOT DETECTED NOT DETECTED Final   Streptococcus pyogenes NOT DETECTED NOT DETECTED Final   Acinetobacter baumannii NOT DETECTED NOT DETECTED Final   Enterobacteriaceae species NOT DETECTED NOT DETECTED Final    Enterobacter cloacae complex NOT DETECTED NOT DETECTED Final   Escherichia coli NOT DETECTED NOT DETECTED Final   Klebsiella oxytoca NOT DETECTED NOT DETECTED Final   Klebsiella pneumoniae NOT DETECTED NOT DETECTED Final   Proteus species NOT DETECTED NOT DETECTED Final   Serratia marcescens NOT DETECTED NOT DETECTED Final   Haemophilus influenzae NOT DETECTED NOT DETECTED Final   Neisseria meningitidis NOT DETECTED NOT DETECTED Final   Pseudomonas aeruginosa NOT DETECTED NOT DETECTED Final   Candida albicans NOT DETECTED NOT DETECTED Final   Candida glabrata NOT DETECTED NOT DETECTED Final   Candida krusei NOT DETECTED NOT DETECTED Final   Candida parapsilosis NOT DETECTED NOT DETECTED Final   Candida tropicalis NOT DETECTED NOT DETECTED Final    Comment: Performed at [facility], [address] [place], [place] [address]  Culture, blood (routine x 2)     Status: None (Preliminary result)   Collection Time: 08/02/18  2:51 PM  Result Value Ref Range Status   Specimen Description   Final    BLOOD LEFT WRIST Performed at [facility], [address] [place], [place] [address]    Special Requests   Final    BOTTLES DRAWN AEROBIC ONLY Blood Culture results may not be optimal due to an inadequate volume of blood received in culture bottles Performed at [facility], [address] [place], [place] [address]    Culture   Final    NO GROWTH 2 DAYS Performed at [facility], [address] [place], [place] [address]    Report Status PENDING  Incomplete  MRSA PCR Screening     Status: None   Collection Time: 08/02/18 10:00 PM  Result Value Ref Range Status   MRSA by PCR NEGATIVE NEGATIVE Final    Comment:        The GeneXpert MRSA Assay (FDA approved for NASAL specimens only), is one component of a comprehensive MRSA colonization surveillance program. It is not intended to diagnose MRSA infection nor to guide or monitor  treatment for MRSA infections. Performed at [facility], [address] [place], [place] [address]          Radiology Studies: No results found.      Scheduled Meds: . chlorhexidine  15 mL Mouth Rinse BID  . cycloSPORINE  1 drop Both Eyes BID  . ezetimibe  10 mg Oral Daily  . furosemide  80 mg Intravenous Q12H  . mouth rinse  15  mL Mouth Rinse q12n4p  . metoprolol tartrate  25 mg Oral BID  . omega-3 acid ethyl esters  1 g Oral BID  . pantoprazole  40 mg Oral Daily  . phenytoin  200 mg Oral BID  . rosuvastatin  40 mg Oral QHS  . spironolactone  25 mg Oral Daily  . warfarin  6 mg Oral ONCE-1800  . Warfarin - Pharmacist Dosing Inpatient   Does not apply q1800   Continuous Infusions:   LOS: 3 days      Alwyn Ren, MD [facility]  If 7PM-7AM, please contact night-coverage [email or web address] Password [email or web address] 08/05/2018, 11:53 AM

## 2018-08-05 NOTE — Progress Notes (Addendum)
Progress Note  Patient Name: James Villegas Date of Encounter: 08/05/2018  Primary Cardiologist: No primary care provider on file. Previously Dr. Excell Seltzer in 2013 for PFO  Subjective   Upon my entering the room the patient is lying flat in bed.  He denies orthopnea or shortness of breath or chest discomfort.  Lungs are clear and no JVD.  He does have bilateral ankle edema right greater than left.  He has right-sided hemiparesis since his stroke in 2010.  Inpatient Medications    Scheduled Meds: . chlorhexidine  15 mL Mouth Rinse BID  . cycloSPORINE  1 drop Both Eyes BID  . ezetimibe  10 mg Oral Daily  . furosemide  80 mg Intravenous Q12H  . mouth rinse  15 mL Mouth Rinse q12n4p  . metoprolol tartrate  25 mg Oral BID  . omega-3 acid ethyl esters  1 g Oral BID  . pantoprazole  40 mg Oral Daily  . phenytoin  200 mg Oral BID  . rosuvastatin  40 mg Oral QHS  . spironolactone  25 mg Oral Daily  . warfarin  4.5 mg Oral q1800  . Warfarin - Pharmacist Dosing Inpatient   Does not apply q1800   Continuous Infusions:  PRN Meds: acetaminophen **OR** acetaminophen, ondansetron **OR** ondansetron (ZOFRAN) IV, traZODone   Vital Signs    Vitals:   08/04/18 1723 08/04/18 2219 08/05/18 0046 08/05/18 0623  BP: (!) 147/100 120/84 119/77 131/81  Pulse: 94 67 78 90  Resp: Temp: (!) 97.5 F (36.4 C) 98.2 F (36.8 C) 97.6 F (36.4 C) 97.7 F (36.5 C)  TempSrc: Oral Oral Oral Oral  SpO2: 96% 96% 96% 98%  Weight:    (!) 138.5 kg  Height:        Intake/Output Summary (Last 24 hours) at 08/05/2018 0844 Last data filed at 08/05/2018 1610 Gross per 24 hour  Intake 840 ml  Output 5535 ml  Net -4695 ml   Last 3 Weights 08/05/2018 08/04/2018 08/02/2018  Weight (lbs) 305 lb 5.4 oz 310 lb 3 oz 336 lb  Weight (kg) 138.5 kg 140.7 kg 152.409 kg      Telemetry    Atrial fibrillation in the 80s-90s with occasional 2.5-second pause- Personally Reviewed  ECG    No new tracings-  Personally Reviewed  Physical Exam   GEN:  Obese male, no acute distress.   Neck: No JVD Cardiac: Irregularly irregular rhythm, no murmurs, rubs, or gallops.  Respiratory: Clear to auscultation bilaterally. GI: Soft, nontender, non-distended  MS:  Trace ankle/pretibial edema R>L Neuro:   Right-sided hemiparesis related to prior stroke, patient oriented to self and place.  Patient is vague on situation. Psych: Normal affect   Labs    Chemistry Recent Labs  Lab 08/02/18 1451 08/03/18 0311 08/04/18 0246  NA 139 141 139  K 4.7 3.9 3.5  CL 102 100 97*  CO2 31 34* 35*  GLUCOSE 105* 91 118*  BUN CREATININE 0.64 0.64 0.69  CALCIUM 8.6* 8.7* 8.8*  PROT 7.0  --  7.0  ALBUMIN 3.6  --  3.5  AST 20  --  21  ALT 19  --  18  ALKPHOS 112  --  120  BILITOT 0.4  --  0.8  GFRNONAA >60 >60 >60  GFRAA >60 >60 >60  ANIONGAP Hematology Recent Labs  Lab 08/02/18 1451 08/03/18 0311 08/04/18 0246  WBC 8.7 8.2  9.4  RBC 4.41 4.61 4.95  HGB 13.2 13.9 14.8  HCT 43.4 45.0 47.9  MCV 98.4 97.6 96.8  MCH 29.9 30.2 29.9  MCHC 30.4 30.9 30.9  RDW 13.8 14.0 14.0  PLT 236 199 234    Cardiac Enzymes Recent Labs  Lab 08/02/18 1451  TROPONINI <0.03   No results for input(s): TROPIPOC in the last 168 hours.   BNP Recent Labs  Lab 08/02/18 1451  BNP 234.9*     DDimer No results for input(s): DDIMER in the last 168 hours.   Radiology    No results found.  Cardiac Studies   Echocardiogram 08/04/2018 IMPRESSIONS   1. The left ventricle has normal systolic function with an ejection fraction of 60-65%. The cavity size was normal. There is moderate asymmetric septal left ventricular hypertrophy. Left ventricular diastology could not be evaluated secondary to atrial  fibrillation.  2. The right ventricle has normal systolic function. The cavity was normal. There is no increase in right ventricular wall thickness. Right ventricular systolic pressure could not be  assessed.  3. The tricuspid valve is normal in structure.  4. The pulmonic valve was normal in structure.  5. There is moderate dilatation of the aortic root at 74mm and of the ascending aorta measuring 45 mm.  6. Left atrial size was mildly dilated.  7. The aortic valve is tricuspid Mild thickening of the aortic valve Aortic valve regurgitation is mild by color flow Doppler.  8. The mitral valve is normal in structure.  9. The inferior vena cava was dilated in size with >50% respiratory variability.  FINDINGS  Left Ventricle: The left ventricle has normal systolic function, with an ejection fraction of 60-65%. The cavity size was normal. There is moderate asymmetric left ventricular hypertrophy. Left ventricular diastology could not be evaluated secondary to  atrial fibrillation. Right Ventricle: The right ventricle has normal systolic function. The cavity was normal. There is no increase in right ventricular wall thickness. Right ventricular systolic pressure could not be assessed. Left Atrium: left atrial size was mildly dilated Right Atrium: right atrial size was normal in size. Right atrial pressure is estimated at 8 mmHg. Interatrial Septum: No atrial level shunt detected by color flow Doppler. Pericardium: There is no evidence of pericardial effusion. Mitral Valve: The mitral valve is normal in structure. Mitral valve regurgitation is not visualized by color flow Doppler. Tricuspid Valve: The tricuspid valve is normal in structure. Tricuspid valve regurgitation was not visualized by color flow Doppler. Aortic Valve: The aortic valve is tricuspid Mild thickening of the aortic valve. Aortic valve regurgitation is mild by color flow Doppler. Pulmonic Valve: The pulmonic valve was normal in structure. Pulmonic valve regurgitation is not visualized by color flow Doppler. Aorta: There is moderate dilatation of the aortic root and of the ascending aorta measuring 44 mm. Venous: The inferior  vena cava is dilated in size with greater than 50% respiratory variability.    Patient Profile     63 y.o. 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 EKG showed new atrial flutter and RBBB. Cardiology consulted for CHF.  Assessment & Plan    Atrial flutter/A. fib of unknown duration -Currently aiming for rate control strategy.  On metoprolol 25 mg twice daily.  Patient continues in atrial fibrillation in the 80s and  90s with occasional 2.5-second pause.  We will continue current therapy. -Patient is anticoagulated for prior DVT and clotting deficiencies.  This will cover stroke risk reduction in the setting of A. Fib. -Plan for outpatient follow-up of A. fib and heart failure.  Acute diastolic CHF -Echocardiogram done yesterday showed normal LV systolic function, mildly dilated LA. -In the setting of A. fib and acute respiratory failure.  -Patient has been diuresing with Lasix 80 mg IV twice daily. Spironolactone added and titrated up to 25 mg daily today. -Good urine output with 5.5 L in the last 24 hours.  Patient is net -16 L fluid balance since admission.  Weight is down 31 pounds. -Patient reports that he had been drinking 4 X 20 ounce bottles of diet Pepsi/diet Dr. Coralee North Dew per day.  Patient advised to limit his soda intake. He will need to be on fluid/salt restriction.  -Patient currently denies orthopnea or shortness of breath.  He still has ankle edema. -Continue current therapy.  Hopefully discharge in 1-2 days. -Pt requested that I call and update his sister which I did. She lives in [place] and appreciated the call.   Acute respiratory failure -Now off BiPAP, on nasal cannula. -There is question of undiagnosed sleep apnea.  Per primary team, upon discharge they will have [facility] nursing facility schedule him for sleep study.  Hypertension -On Metoprolol added for heart rate control.  Now on Lasix and Spironolactone for fluid overload.  Today blood pressure is well controlled.  Hyperlipidemia -On Crestor and Zetia  Altered mental status -Likely related to hypercapnic respiratory failure.  The patient is being followed by neurology. -Mental status has improved.  Patient knows where he is but he was not altogether sure why he was here.  I helped to explain his current situation and he verbalizes understanding.  For questions or updates, please contact [facility] Please consult [email or web address] for contact info under     Signed, James Bon, NP  08/05/2018, 8:44 AM     The patient was seen, examined and discussed with James Bon, NP-C and I agree with the above.   62 y.o.malewith a hx of leftMCA CVA in 2010 with residual Rt hemiparesis,wheelchair bound since then, a resident of a nursing home,+PFO on coumadin, hypercoagulable disorder with Protein C and S def. and + lupus anticoagulant,whowas admitted with acute hypercapnic respiratory failure currently on BiPAP, believed to be secondary to acute CHF exacerbation, new diagnosis of atrial fibrillation, and acute metabolic encephalopathy believed to be secondary to hypercapnia. Acute diastolic CHF, LVEF 60-65%, he diuresed another 4.7 L overnight, stable Crea 0.69, I would continue IV Lasix 80 mg IV twice daily and spironolactone to 25 mg daily till tomorrow, then switch to Lasix 40 mg PO BID, and continue spironolactone 25 mg p.o. daily. Continue BiPAP. New onset atrial fibrillation that is rate controlled, continue anticoagulation with Coumadin, rate controlled with Lopressor. We will continue rate control. Hyperlipidemia-continue Crestor and Zetia Hypertension-improved.   [facility] will sign off.   Medication Recommendations: as above Other recommendations (labs, testing, etc):  No further  testing Follow up as an outpatient:  We will arrange for a follow up  Tobias Alexander 08/05/2018

## 2018-08-05 NOTE — Evaluation (Signed)
Physical Therapy Evaluation Patient Details Name: James Villegas MRN: [id number] DOB: [birth year] Today's Date: 08/05/2018   History of Present Illness  63 year old male with history of left MCA CVA with right hemiparesis, lupus anticoagulant disorder, right lower extremity DVT status post IVC filter, bilateral pulmonary involvement, morbid obesity, protein C and protein S deficiency, patent foreman ovale, rectus sheath hematoma, depression, anxiety, hypertension and dyslipidemia presented with somnolence on 08/02/2018. Pt admitted for acute hypercapnic respiratory failure secondary to acute CHF exacerbation  Clinical Impression  Pt admitted with above diagnosis. Pt currently with functional limitations due to the deficits listed below (see PT Problem List).  Pt will benefit from skilled PT to increase their independence and safety with mobility to allow discharge to the venue listed below.  Pt assisted with sitting EOB however unable to attempt transfer due to pt not having his shoes (prefers shoes, nonslip).  Pt reports he is typically able to stand pivot transfer to w/c at facility at baseline however has become weaker since admission.  Pt has been OOB by hoyer lift at facility and agreeable for nursing to use this for OOB during admission.  RN aware.    Follow Up Recommendations SNF    Equipment Recommendations  None recommended by PT    Recommendations for Other Services       Precautions / Restrictions Precautions Precautions: Fall Precaution Comments: R hemiplegia      Mobility  Bed Mobility Overal bed mobility: Needs Assistance Bed Mobility: Supine to Sit;Sit to Supine     Supine to sit: Mod assist Sit to supine: Mod assist   General bed mobility comments: assist for right side, pt self assisted with rail  Transfers                 General transfer comment: pt unable today (requires his shoes which not in room so RN notified and requested to call ICU since pt had  transferred)  Ambulation/Gait             General Gait Details: nonambulatory at baseline  Stairs            Wheelchair Mobility    Modified Rankin (Stroke Patients Only)       Balance Overall balance assessment: Needs assistance Sitting-balance support: Feet supported;No upper extremity supported Sitting balance-Leahy Scale: Fair                                       Pertinent Vitals/Pain Pain Assessment: No/denies pain    Home Living Family/patient expects to be discharged to:: Skilled nursing facility                      Prior Function Level of Independence: Needs assistance         Comments: pt reports he can assist with stand pivot transfers to manual w/c if able to pull up on something     Hand Dominance        Extremity/Trunk Assessment   Upper Extremity Assessment Upper Extremity Assessment: RUE deficits/detail RUE Deficits / Details: hx of hemiplegia from previous CVA    Lower Extremity Assessment Lower Extremity Assessment: RLE deficits/detail RLE Deficits / Details: hx of hemiplegia from previous CVA       Communication   Communication: No difficulties  Cognition Arousal/Alertness: Awake/alert Behavior During Therapy: WFL for tasks assessed/performed Overall Cognitive Status: Within Functional Limits for tasks  assessed                                        General Comments      Exercises     Assessment/Plan    PT Assessment Patient needs continued PT services  PT Problem List Decreased strength;Decreased mobility;Decreased activity tolerance;Decreased knowledge of use of DME;Decreased balance       PT Treatment Interventions DME instruction;Functional mobility training;Balance training;Patient/family education;Therapeutic activities;Therapeutic exercise;Wheelchair mobility training    PT Goals (Current goals can be found in the Care Plan section)  Acute Rehab PT Goals PT Goal  Formulation: With patient Time For Goal Achievement: 08/12/18 Potential to Achieve Goals: Good    Frequency Min 2X/week   Barriers to discharge        Co-evaluation               AM-PAC PT "6 Clicks" Mobility  Outcome Measure Help needed turning from your back to your side while in a flat bed without using bedrails?: A Lot Help needed moving from lying on your back to sitting on the side of a flat bed without using bedrails?: A Lot Help needed moving to and from a bed to a chair (including a wheelchair)?: Total Help needed standing up from a chair using your arms (e.g., wheelchair or bedside chair)?: Total Help needed to walk in hospital room?: Total Help needed climbing 3-5 steps with a railing? : Total 6 Click Score: 8    End of Session Equipment Utilized During Treatment: Oxygen Activity Tolerance: Patient tolerated treatment well Patient left: in bed;with call bell/phone within reach;with bed alarm set Nurse Communication: Mobility status;Need for lift equipment PT Visit Diagnosis: Muscle weakness (generalized) (M62.81)    Time: 4010-2725 PT Time Calculation (min) (ACUTE ONLY): 23 min   Charges:   PT Evaluation $PT Eval Low Complexity: 1 Low     Zenovia Jarred, PT, DPT Acute Rehabilitation Services Office: [phone] Pager: [phone]  Sarajane Jews 08/05/2018, 1:52 PM

## 2018-08-05 NOTE — Telephone Encounter (Signed)
Pt is still currently in the hospital.  Nursing to continue to monitor for discharge, for TCM follow-up.

## 2018-08-06 DIAGNOSIS — E782 Mixed hyperlipidemia: Secondary | ICD-10-CM | POA: Diagnosis not present

## 2018-08-06 DIAGNOSIS — M3210 Systemic lupus erythematosus, organ or system involvement unspecified: Secondary | ICD-10-CM | POA: Diagnosis not present

## 2018-08-06 DIAGNOSIS — I4821 Permanent atrial fibrillation: Secondary | ICD-10-CM | POA: Diagnosis not present

## 2018-08-06 DIAGNOSIS — I509 Heart failure, unspecified: Secondary | ICD-10-CM | POA: Diagnosis not present

## 2018-08-06 DIAGNOSIS — Q211 Atrial septal defect: Secondary | ICD-10-CM | POA: Diagnosis not present

## 2018-08-06 DIAGNOSIS — R1311 Dysphagia, oral phase: Secondary | ICD-10-CM | POA: Diagnosis not present

## 2018-08-06 DIAGNOSIS — G609 Hereditary and idiopathic neuropathy, unspecified: Secondary | ICD-10-CM | POA: Diagnosis not present

## 2018-08-06 DIAGNOSIS — D6489 Other specified anemias: Secondary | ICD-10-CM | POA: Diagnosis not present

## 2018-08-06 DIAGNOSIS — D688 Other specified coagulation defects: Secondary | ICD-10-CM | POA: Diagnosis not present

## 2018-08-06 DIAGNOSIS — F322 Major depressive disorder, single episode, severe without psychotic features: Secondary | ICD-10-CM | POA: Diagnosis not present

## 2018-08-06 DIAGNOSIS — R531 Weakness: Secondary | ICD-10-CM | POA: Diagnosis not present

## 2018-08-06 DIAGNOSIS — I69359 Hemiplegia and hemiparesis following cerebral infarction affecting unspecified side: Secondary | ICD-10-CM | POA: Diagnosis not present

## 2018-08-06 DIAGNOSIS — I69852 Hemiplegia and hemiparesis following other cerebrovascular disease affecting left dominant side: Secondary | ICD-10-CM | POA: Diagnosis not present

## 2018-08-06 DIAGNOSIS — G40909 Epilepsy, unspecified, not intractable, without status epilepticus: Secondary | ICD-10-CM | POA: Diagnosis not present

## 2018-08-06 DIAGNOSIS — F331 Major depressive disorder, recurrent, moderate: Secondary | ICD-10-CM | POA: Diagnosis not present

## 2018-08-06 DIAGNOSIS — I4891 Unspecified atrial fibrillation: Secondary | ICD-10-CM | POA: Diagnosis not present

## 2018-08-06 DIAGNOSIS — D6869 Other thrombophilia: Secondary | ICD-10-CM | POA: Diagnosis not present

## 2018-08-06 DIAGNOSIS — I5033 Acute on chronic diastolic (congestive) heart failure: Secondary | ICD-10-CM | POA: Diagnosis not present

## 2018-08-06 DIAGNOSIS — F411 Generalized anxiety disorder: Secondary | ICD-10-CM | POA: Diagnosis not present

## 2018-08-06 DIAGNOSIS — Z79899 Other long term (current) drug therapy: Secondary | ICD-10-CM | POA: Diagnosis not present

## 2018-08-06 DIAGNOSIS — I5030 Unspecified diastolic (congestive) heart failure: Secondary | ICD-10-CM | POA: Diagnosis not present

## 2018-08-06 DIAGNOSIS — G4089 Other seizures: Secondary | ICD-10-CM | POA: Diagnosis not present

## 2018-08-06 DIAGNOSIS — I5032 Chronic diastolic (congestive) heart failure: Secondary | ICD-10-CM | POA: Diagnosis not present

## 2018-08-06 DIAGNOSIS — I82409 Acute embolism and thrombosis of unspecified deep veins of unspecified lower extremity: Secondary | ICD-10-CM | POA: Diagnosis not present

## 2018-08-06 DIAGNOSIS — R0902 Hypoxemia: Secondary | ICD-10-CM | POA: Diagnosis not present

## 2018-08-06 DIAGNOSIS — K219 Gastro-esophageal reflux disease without esophagitis: Secondary | ICD-10-CM | POA: Diagnosis not present

## 2018-08-06 DIAGNOSIS — I10 Essential (primary) hypertension: Secondary | ICD-10-CM | POA: Diagnosis not present

## 2018-08-06 DIAGNOSIS — G4700 Insomnia, unspecified: Secondary | ICD-10-CM | POA: Diagnosis not present

## 2018-08-06 LAB — BASIC METABOLIC PANEL
Anion gap: 8 (ref 5–15)
BUN: 20 mg/dL (ref 6–20)
CHLORIDE: 99 mmol/L (ref 98–111)
CO2: 34 mmol/L — AB (ref 22–32)
Calcium: 8.8 mg/dL — ABNORMAL LOW (ref 8.9–10.3)
Creatinine, Ser: 0.63 mg/dL (ref 0.61–1.24)
GFR calc Af Amer: >60 mL/min (ref >60)
GFR calc non Af Amer: >60 mL/min (ref >60)
GLUCOSE: 96 mg/dL (ref 70–99)
Potassium: 3.6 mmol/L (ref 3.5–5.1)
Sodium: 141 mmol/L (ref 135–145)

## 2018-08-06 LAB — PROTIME-INR
INR: 1.8 — ABNORMAL HIGH (ref 0.8–1.2)
Prothrombin Time: 21 seconds — ABNORMAL HIGH (ref 11.4–15.2)

## 2018-08-06 MED ORDER — POTASSIUM CHLORIDE ER 10 MEQ PO TBCR: 20.0000 meq | EXTENDED_RELEASE_TABLET | Freq: Every day | ORAL | 0 refills | Status: DC

## 2018-08-06 MED ORDER — WARFARIN SODIUM 5 MG PO TABS
7.5000 mg | ORAL_TABLET | Freq: Once | ORAL | Status: AC
  Administered 2018-08-06: 7.5 mg via ORAL
  Filled 2018-08-06: qty 1

## 2018-08-06 MED ORDER — SPIRONOLACTONE 25 MG PO TABS: 25.0000 mg | ORAL_TABLET | Freq: Every day | ORAL | 0 refills | Status: DC

## 2018-08-06 MED ORDER — FUROSEMIDE 40 MG PO TABS: 40.0000 mg | ORAL_TABLET | Freq: Two times a day (BID) | ORAL | 11 refills | Status: DC

## 2018-08-06 MED ORDER — ENOXAPARIN SODIUM 150 MG/ML ~~LOC~~ SOLN
140.0000 mg | Freq: Once | SUBCUTANEOUS | Status: AC
  Administered 2018-08-06: 140 mg via SUBCUTANEOUS
  Filled 2018-08-06: qty 0.93

## 2018-08-06 NOTE — Telephone Encounter (Signed)
Pt still currently in the hospital.  Nursing to continue to monitor for TCM follow-up call.

## 2018-08-06 NOTE — Progress Notes (Signed)
Report called to [facility] at [facility]. All questions answered, contact number provided. Pt aware of transfer back to his facility and agreeable.

## 2018-08-06 NOTE — Discharge Summary (Signed)
Physician Discharge Summary  James Villegas:[id number] DOB: 09-11-1955 DOA: 08/02/2018  PCP: [name], MD  Admit date: 08/02/2018 Discharge date: 08/06/2018  Admitted From: Nursing home Disposition: Nursing home Recommendations for Outpatient Follow-up:  1. Follow up with PCP in 1-2 weeks 2. Please obtain BMP/CBC in one week 3. 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 He was found to be in acute hypercapnic respiratory failure for which he required noninvasive mechanical ventilation and IV Lasix.   Discharge Diagnoses:  Active Problems:   Cerebral infarction (HCC)   DVT (deep venous thrombosis) (HCC)   Essential hypertension, benign   Respiratory failure (HCC)   New onset atrial fibrillation (HCC)   Chronic anticoagulation   Acute on chronic congestive heart failure (HCC)   #1 acute hypercapnic respiratory failure secondary to acute CHF exacerbation. Patient was treated with BiPAP and IV diuresis  On IV Lasix 80 mg twice a day.  He is negative by almost 18.4L!! Follow-up echo shows normal ejection fraction. Seen in consultation by cardiology.  Cardiology following. Currently on 2 L of oxygen by nasal cannula.?Undiagnosed Sleepapnea.  Please evaluate him for sleep apnea after he reaches [facility] nursing facility.  This will need to be done as an outpatient.    #2 new onset paroxysmal atrial fibrillation/PFO Patient was already anticoagulated  with Coumadin for hypercoagulable state. Rate controlled on metoprolol. Continue anticoagulation with Coumadin.  Continue Coumadin as prior.  Patient was given a dose of Lovenox and Coumadin 7.5 mg on the day of discharge as his INR was 1.80. PFO to be managed medically.  #3 hypertension stable on metoprolol  #4 hyperlipidemia continue Crestor Zetia and Lovaza  #5 history of stroke with residual right-sided weakness continue Coumadin and Crestor.  #6 acute metabolic encephalopathy improving secondary to #1. EEG ordered at the time of admission showed no evidence of seizure activity.  #7 protein C and protein S deficiency with history of stroke and PE on Coumadin  #8 history of seizure disorder level 12.0. TSH 2.96. Continue Dilantin  Estimated body mass index is 39.98 kg/m as calculated from the following:   Height as of this encounter:  (1.854 m).   Weight as of this encounter: 137.4 kg.  Discharge Instructions  Discharge Instructions    Call MD for:  difficulty breathing, headache or visual disturbances   Complete by:  As directed    Call MD for:  persistant dizziness or light-headedness   Complete by:  As directed    Call MD for:  persistant nausea and vomiting   Complete by:  As directed    Call MD for:  temperature >100.4   Complete by:  As directed    Diet - low sodium heart healthy   Complete by:  As directed    Increase activity slowly   Complete by:  As directed      Allergies as of 08/06/2018      Reactions   Fenofibrate Other (See Comments)   Per [facility]      Medication List    STOP taking these medications  baclofen 20 MG tablet Commonly known as:  LIORESAL   HYDROcodone-acetaminophen 5-325 MG tablet Commonly known as:  NORCO/VICODIN     TAKE these medications   eucerin cream Apply 1 application topically daily. Apply to areas of dry skin once daily   ezetimibe 10 MG tablet Commonly known as:  ZETIA Take 10 mg by mouth daily.    fish oil-omega-3 fatty acids 1000 MG capsule Take 1 g by mouth 2 (two) times daily.   furosemide 40 MG tablet Commonly known as:  LASIX Take 1 tablet (40 mg total) by mouth 2 (two) times daily. What changed:    medication strength  how much to take  when to take this   gabapentin 300 MG capsule Commonly known as:  NEURONTIN Take 300 mg by mouth 3 (three) times daily.   Melatonin 3 MG Tabs Take 3 mg by mouth at bedtime.   metoprolol tartrate 25 MG tablet Commonly known as:  LOPRESSOR Take 25 mg by mouth 2 (two) times daily.   omeprazole 20 MG capsule Commonly known as:  PRILOSEC Take 20 mg by mouth at bedtime.   phenytoin 100 MG ER capsule Commonly known as:  DILANTIN Take 200 mg by mouth 2 (two) times daily.   potassium chloride 10 MEQ tablet Commonly known as:  K-DUR Take 2 tablets (20 mEq total) by mouth daily.   REFRESH OPTIVE ADVANCED OP Place 1 drop into both eyes 4 (four) times daily. Wait 3-5 minutes between eye meds   RESTASIS 0.05 % ophthalmic emulsion Generic drug:  cycloSPORINE Place 1 drop into both eyes 2 (two) times daily.   rosuvastatin 40 MG tablet Commonly known as:  CRESTOR Take 40 mg by mouth at bedtime.   spironolactone 25 MG tablet Commonly known as:  ALDACTONE Take 1 tablet (25 mg total) by mouth daily. Start taking on:  August 07, 2018   traZODone 50 MG tablet Commonly known as:  DESYREL Take 50 mg by mouth at bedtime.   venlafaxine 75 MG tablet Commonly known as:  EFFEXOR Take 75 mg by mouth daily.   warfarin 2 MG tablet Commonly known as:  COUMADIN Take as directed. If you are unsure how to take this medication, talk to your nurse or doctor. Original instructions:  Take 2 mg by mouth See admin instructions. Take with 2.5 mg to equal 4.5 mg daily   warfarin 2.5 MG tablet Commonly known as:  COUMADIN Take as directed. If you are unsure how to take this medication, talk to your nurse or doctor. Original instructions:  Take  2.5 mg by mouth See admin instructions. Take with 2.0 mg daily to equal 4.5 mg daily       Contact information for follow-up providers    [place], Sharrell Ku, PA Follow up.   Specialty:  Cardiology Why:  Cardiology hospital follow up on 08/18/2018 at 9:00. Please arrive 15 minutes early for check in.  Contact information: [address] [place] [address] [phone]            Contact information for after-discharge care    Destination    [facility] .   Service:  Skilled Nursing Contact information: [address] [place] [address] [phone]                 Allergies  Allergen Reactions  . Fenofibrate Other (See Comments)    Per [facility]    Consultations: Cardiology  Procedures/Studies: Dg Chest 1 View  Result  Date: 08/03/2018 CLINICAL DATA:  Dyspnea EXAM: CHEST  1 VIEW COMPARISON:  08/25/2014 FINDINGS: Shallow inspiration. Cardiac enlargement. Mild vascular congestion. Perihilar infiltration, greater on the left, likely representing mild edema. Small bilateral pleural effusions. No pneumothorax. Mediastinal contours appear intact. IMPRESSION: Cardiac enlargement with mild pulmonary vascular congestion and perihilar edema. Small bilateral pleural effusions. Electronically Signed   By: Burman Nieves M.D.   On: 08/03/2018 00:10    (Echo, Carotid, EGD, Colonoscopy, ERCP)    Subjective: Resting in bed eating breakfast denies any complaints breathing better decreased swelling   Discharge Exam: Vitals:   08/06/18 0431 08/06/18 0956  BP: 116/70 126/77  Pulse: 77 77  Resp: 18 18  Temp: (!) 97.3 F (36.3 C) 97.9 F (36.6 C)  SpO2: 97% 94%   Vitals:   08/05/18 1421 08/05/18 2123 08/06/18 0431 08/06/18 0956  BP: 113/64 131/87 116/70 126/77  Pulse: (!) 55 89 77 77  Resp: 15 20 18 18   Temp: 97.9 F (36.6 C) 98.3 F (36.8 C) (!) 97.3 F (36.3 C) 97.9 F (36.6 C)  TempSrc: Oral Oral Oral Oral  SpO2: 96% 98% 97%  94%  Weight:   (!) 137.4 kg   Height:        General: Pt is alert, awake, not in acute distress Cardiovascular: RRR, S1/S2 +, no rubs, no gallops Respiratory: Diminished breath sounds at the bases bilaterally, no wheezing, no rhonchi Abdominal: Soft, NT, ND, bowel sounds + Extremities: Chronic nonpitting edema right more than left, no cyanosis    The results of significant diagnostics from this hospitalization (including imaging, microbiology, ancillary and laboratory) are listed below for reference.     Microbiology: Recent Results (from the past 240 hour(s))  Culture, blood (routine x 2)     Status: Abnormal (Preliminary result)   Collection Time: 08/02/18  2:47 PM  Result Value Ref Range Status   Specimen Description   Final    BLOOD LEFT ANTECUBITAL Performed at [facility], [address] [place], [place] [address]    Special Requests   Final    BOTTLES DRAWN AEROBIC AND ANAEROBIC Blood Culture adequate volume   Culture  Setup Time   Final    GRAM POSITIVE COCCI IN CLUSTERS IN BOTH AEROBIC AND ANAEROBIC BOTTLES CRITICAL RESULT CALLED TO, READ BACK BY AND VERIFIED WITH: Fenton Foy PharmD 13:05 08/04/18 ([email or web address])    Culture (A)  Final    STAPHYLOCOCCUS SPECIES (COAGULASE NEGATIVE) THE SIGNIFICANCE OF ISOLATING THIS ORGANISM FROM A SINGLE SET OF BLOOD CULTURES WHEN MULTIPLE SETS ARE DRAWN IS UNCERTAIN. PLEASE NOTIFY THE MICROBIOLOGY DEPARTMENT WITHIN ONE WEEK IF SPECIATION AND SENSITIVITIES ARE REQUIRED. Performed at [facility], [address], [place], [place] [address]    Report Status PENDING  Incomplete  Blood Culture ID Panel (Reflexed)     Status: Abnormal   Collection Time: 08/02/18  2:47 PM  Result Value Ref Range Status   Enterococcus species NOT DETECTED NOT DETECTED Final   Listeria monocytogenes NOT DETECTED NOT DETECTED Final   Staphylococcus species DETECTED (A) NOT DETECTED Final    Comment: Methicillin (oxacillin) susceptible  coagulase negative staphylococcus. Possible blood culture contaminant (unless isolated from more than one blood culture draw or clinical case suggests pathogenicity). No antibiotic treatment is indicated for blood  culture contaminants. CRITICAL RESULT CALLED TO, READ BACK BY AND VERIFIED WITH: Fenton Foy PharmD 13:05 08/04/18 ([email or web address])    Staphylococcus aureus (BCID) NOT DETECTED NOT DETECTED Final   Methicillin resistance NOT  DETECTED NOT DETECTED Final   Streptococcus species NOT DETECTED NOT DETECTED Final   Streptococcus agalactiae NOT DETECTED NOT DETECTED Final   Streptococcus pneumoniae NOT DETECTED NOT DETECTED Final   Streptococcus pyogenes NOT DETECTED NOT DETECTED Final   Acinetobacter baumannii NOT DETECTED NOT DETECTED Final   Enterobacteriaceae species NOT DETECTED NOT DETECTED Final   Enterobacter cloacae complex NOT DETECTED NOT DETECTED Final   Escherichia coli NOT DETECTED NOT DETECTED Final   Klebsiella oxytoca NOT DETECTED NOT DETECTED Final   Klebsiella pneumoniae NOT DETECTED NOT DETECTED Final   Proteus species NOT DETECTED NOT DETECTED Final   Serratia marcescens NOT DETECTED NOT DETECTED Final   Haemophilus influenzae NOT DETECTED NOT DETECTED Final   Neisseria meningitidis NOT DETECTED NOT DETECTED Final   Pseudomonas aeruginosa NOT DETECTED NOT DETECTED Final   Candida albicans NOT DETECTED NOT DETECTED Final   Candida glabrata NOT DETECTED NOT DETECTED Final   Candida krusei NOT DETECTED NOT DETECTED Final   Candida parapsilosis NOT DETECTED NOT DETECTED Final   Candida tropicalis NOT DETECTED NOT DETECTED Final    Comment: Performed at [facility], [address] [place], [place] [address]  Culture, blood (routine x 2)     Status: None (Preliminary result)   Collection Time: 08/02/18  2:51 PM  Result Value Ref Range Status   Specimen Description BLOOD LEFT WRIST  Final   Special Requests   Final    BOTTLES DRAWN AEROBIC ONLY Blood Culture results may  not be optimal due to an inadequate volume of blood received in culture bottles Performed at [facility], [address] [place], [place] [address]    Culture NO GROWTH 4 DAYS  Final   Report Status PENDING  Incomplete  MRSA PCR Screening     Status: None   Collection Time: 08/02/18 10:00 PM  Result Value Ref Range Status   MRSA by PCR NEGATIVE NEGATIVE Final    Comment:        The GeneXpert MRSA Assay (FDA approved for NASAL specimens only), is one component of a comprehensive MRSA colonization surveillance program. It is not intended to diagnose MRSA infection nor to guide or monitor treatment for MRSA infections. Performed at [facility], [address], [place], [place] [address]      Labs: BNP (last 3 results) Recent Labs    08/02/18 1451  BNP 234.9*   Basic Metabolic Panel: Recent Labs  Lab 08/02/18 1451 08/03/18 0311 08/04/18 0246 08/06/18 0546  NA 139 141 139 141  K 4.7 3.9 3.5 3.6  CL 102 100 97* 99  CO2 31 34* 35* 34*  GLUCOSE 105* 91 118* 96  BUN CREATININE 0.64 0.64 0.69 0.63  CALCIUM 8.6* 8.7* 8.8* 8.8*  MG  --   --  1.9  --    Liver Function Tests: Recent Labs  Lab 08/02/18 1451 08/04/18 0246  AST 20 21  ALT 19 18  ALKPHOS 112 120  BILITOT 0.4 0.8  PROT 7.0 7.0  ALBUMIN 3.6 3.5   No results for input(s): LIPASE, AMYLASE in the last 168 hours. Recent Labs  Lab 08/04/18 0246  AMMONIA 58*   CBC: Recent Labs  Lab 08/02/18 1451 08/03/18 0311 08/04/18 0246  WBC 8.7 8.2 9.4  NEUTROABS 6.6  --  6.8  HGB 13.2 13.9 14.8  HCT 43.4 45.0 47.9  45.4  MCV 98.4 97.6 96.8  PLT 236 199 234   Cardiac  Enzymes: Recent Labs  Lab 08/02/18 1451  TROPONINI <0.03   BNP: Invalid input(s): POCBNP CBG: No results for input(s): GLUCAP in the last 168 hours. D-Dimer No results for input(s): DDIMER in the last 72 hours. Hgb A1c No results for input(s): HGBA1C in the last 72 hours. Lipid  Profile Recent Labs    08/05/18 0401  CHOL 194  HDL 51  LDLCALC 116*  TRIG 137  CHOLHDL 3.8   Thyroid function studies Recent Labs    08/04/18 0246  TSH 2.966   Anemia work up Recent Labs    08/04/18 0246  VITAMINB12 195   Urinalysis    Component Value Date/Time   COLORURINE AMBER (A) 12/28/2010 1809   APPEARANCEUR CLOUDY (A) 12/28/2010 1809   LABSPEC 1.021 12/28/2010 1809   PHURINE 5.5 12/28/2010 1809   GLUCOSEU NEGATIVE 12/28/2010 1809   HGBUR NEGATIVE 12/28/2010 1809   BILIRUBINUR SMALL (A) 12/28/2010 1809   KETONESUR NEGATIVE 12/28/2010 1809   PROTEINUR NEGATIVE 12/28/2010 1809   UROBILINOGEN 1.0 12/28/2010 1809   NITRITE NEGATIVE 12/28/2010 1809   LEUKOCYTESUR TRACE (A) 12/28/2010 1809   Sepsis Labs Invalid input(s): PROCALCITONIN,  WBC,  LACTICIDVEN Microbiology Recent Results (from the past 240 hour(s))  Culture, blood (routine x 2)     Status: Abnormal (Preliminary result)   Collection Time: 08/02/18  2:47 PM  Result Value Ref Range Status   Specimen Description   Final    BLOOD LEFT ANTECUBITAL Performed at [facility], [address] [place], [place] [address]    Special Requests   Final    BOTTLES DRAWN AEROBIC AND ANAEROBIC Blood Culture adequate volume   Culture  Setup Time   Final    GRAM POSITIVE COCCI IN CLUSTERS IN BOTH AEROBIC AND ANAEROBIC BOTTLES CRITICAL RESULT CALLED TO, READ BACK BY AND VERIFIED WITH: Fenton Foy PharmD 13:05 08/04/18 ([email or web address])    Culture (A)  Final    STAPHYLOCOCCUS SPECIES (COAGULASE NEGATIVE) THE SIGNIFICANCE OF ISOLATING THIS ORGANISM FROM A SINGLE SET OF BLOOD CULTURES WHEN MULTIPLE SETS ARE DRAWN IS UNCERTAIN. PLEASE NOTIFY THE MICROBIOLOGY DEPARTMENT WITHIN ONE WEEK IF SPECIATION AND SENSITIVITIES ARE REQUIRED. Performed at [facility], [address], [place], [place] [address]    Report Status PENDING  Incomplete  Blood Culture ID Panel (Reflexed)     Status: Abnormal   Collection  Time: 08/02/18  2:47 PM  Result Value Ref Range Status   Enterococcus species NOT DETECTED NOT DETECTED Final   Listeria monocytogenes NOT DETECTED NOT DETECTED Final   Staphylococcus species DETECTED (A) NOT DETECTED Final    Comment: Methicillin (oxacillin) susceptible coagulase negative staphylococcus. Possible blood culture contaminant (unless isolated from more than one blood culture draw or clinical case suggests pathogenicity). No antibiotic treatment is indicated for blood  culture contaminants. CRITICAL RESULT CALLED TO, READ BACK BY AND VERIFIED WITH: Fenton Foy PharmD 13:05 08/04/18 ([email or web address])    Staphylococcus aureus (BCID) NOT DETECTED NOT DETECTED Final   Methicillin resistance NOT DETECTED NOT DETECTED Final   Streptococcus species NOT DETECTED NOT DETECTED Final   Streptococcus agalactiae NOT DETECTED NOT DETECTED Final   Streptococcus pneumoniae NOT DETECTED NOT DETECTED Final   Streptococcus pyogenes NOT DETECTED NOT DETECTED Final   Acinetobacter baumannii NOT DETECTED NOT DETECTED Final   Enterobacteriaceae species NOT DETECTED NOT DETECTED Final   Enterobacter cloacae complex NOT DETECTED NOT DETECTED Final   Escherichia coli NOT DETECTED NOT DETECTED Final   Klebsiella oxytoca NOT DETECTED NOT DETECTED  Final   Klebsiella pneumoniae NOT DETECTED NOT DETECTED Final   Proteus species NOT DETECTED NOT DETECTED Final   Serratia marcescens NOT DETECTED NOT DETECTED Final   Haemophilus influenzae NOT DETECTED NOT DETECTED Final   Neisseria meningitidis NOT DETECTED NOT DETECTED Final   Pseudomonas aeruginosa NOT DETECTED NOT DETECTED Final   Candida albicans NOT DETECTED NOT DETECTED Final   Candida glabrata NOT DETECTED NOT DETECTED Final   Candida krusei NOT DETECTED NOT DETECTED Final   Candida parapsilosis NOT DETECTED NOT DETECTED Final   Candida tropicalis NOT DETECTED NOT DETECTED Final    Comment: Performed at [facility], [address] [place], [place]  [address]  Culture, blood (routine x 2)     Status: None (Preliminary result)   Collection Time: 08/02/18  2:51 PM  Result Value Ref Range Status   Specimen Description BLOOD LEFT WRIST  Final   Special Requests   Final    BOTTLES DRAWN AEROBIC ONLY Blood Culture results may not be optimal due to an inadequate volume of blood received in culture bottles Performed at [facility], [address], [place], [place] [address]    Culture NO GROWTH 4 DAYS  Final   Report Status PENDING  Incomplete  MRSA PCR Screening     Status: None   Collection Time: 08/02/18 10:00 PM  Result Value Ref Range Status   MRSA by PCR NEGATIVE NEGATIVE Final    Comment:        The GeneXpert MRSA Assay (FDA approved for NASAL specimens only), is one component of a comprehensive MRSA colonization surveillance program. It is not intended to diagnose MRSA infection nor to guide or monitor treatment for MRSA infections. Performed at [facility], [address] [place], [place] [address]      Time coordinating discharge: 33 minutes  SIGNED:   Alwyn Ren, MD  [facility] 08/06/2018, 10:53 AM Pager   If 7PM-7AM, please contact night-coverage [email or web address] Password [email or web address]

## 2018-08-06 NOTE — Progress Notes (Signed)
Patient returning to [facility] Room [address]  SNF ready to accept.  PTAR to transport.    Vivi Barrack, [place], MSW Clinical Social Worker  [phone] 08/06/2018  11:26 AM

## 2018-08-06 NOTE — Progress Notes (Addendum)
ANTICOAGULATION CONSULT NOTE - Follow Up Consult  Pharmacy Consult for warfarin Indication: hx pulmonary embolus and DVT, CVA, lupus anticoagulant  Allergies  Allergen Reactions  . Fenofibrate Other (See Comments)    Per [facility]    Patient Measurements: Height: 6\' 1"  (185.4 cm) Weight: (!) 303 lb (137.4 kg) IBW/kg (Calculated) : 79.9 Heparin Dosing Weight:   Vital Signs: Temp: 97.3 F (36.3 C) (02/28 0431) Temp Source: Oral (02/28 0431) BP: 116/70 (02/28 0431) Pulse Rate: 77 (02/28 0431)  Labs: Recent Labs    08/04/18 0246 08/05/18 0401 08/06/18 0352 08/06/18 0546  HGB 14.8  --   --   --   HCT 47.9  45.4  --   --   --   PLT 234  --   --   --   LABPROT 28.6* 22.0* 21.0*  --   INR 2.7* 2.0* 1.8*  --   CREATININE 0.69  --   --  0.63    Estimated Creatinine Clearance: 142.9 mL/min (by C-G formula based on SCr of 0.63 mg/dL).   Medications:  PTA warfarin regimen: 4.5 mg daily  Assessment: Patient's a 63 y.o M with hx PE/DVT (s/p IVC filter), CVA, protein C&S deficiency and lupus anticoagulant on warfarin PTA presented to the ED from nursing facility on 2/24 with SOB and AMS. Patient was also found to have new onset afib.  Warfarin resumed on admission.  Today, 08/06/2018: - INR is trending down and is subtherapeutic at 1.8 (all warfarin doses charted) - last cbc on 2/26 ok - on regular diet - no significant drug-drug intxns  Goal of Therapy:  INR goal 2.3-3.5 Monitor platelets by anticoagulation protocol: Yes   Plan:  - increase warfarin dose to 7.5 mg PO x1, give early at 10A - monitor for s/s bleeding - lovenox 1mg /kg sq x 1 since INR subtherapeutic  Loralee Pacas, PharmD, BCPS Pager: [phone] 08/06/2018,7:37 AM

## 2018-08-07 LAB — CULTURE, BLOOD (ROUTINE X 2)
Culture: NO GROWTH
SPECIAL REQUESTS: ADEQUATE

## 2018-08-10 NOTE — Telephone Encounter (Signed)
Per discharge summary the pt was discharged to a SNF: Contact information for after-discharge care            Destination        [facility] .   Service:  Skilled Nursing Contact information[address] [place] [address] [phone]       No TCM call required.

## 2018-08-17 NOTE — Progress Notes (Signed)
Cardiology Office Note    Date:  08/18/2018   ID:  James Villegas, DOB 01/27/1956, MRN [id number]  PCP:  [name], MD  Cardiologist:  Dr. Delton See   Chief Complaint: Hospital follow up   History of Present Illness:   James Villegas is a 63 y.o. 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 and + lupus anticoagulant, maintained on lovenox and recent admission for CHF and flutter presents for follow up.   James Villegas with above hx and per Dr. Excell Seltzer (last seen 2013) no closure of PFO -medication with anticoagulation only.    Admitted 2/20 with new atrial flutter, RBBB and CHF. On rate control strategy. Echocardiogram showed normal LV systolic function, mildly dilated LA. Diuresed well.   Here today for follow up.  He has cut back on soda and food intake.  Unable to get daily weight due to hemiparalysis and obesity.  He denies chest pain, shortness of breath, orthopnea, PND, palpitation, dizziness or syncope.  He has chronic lower extremity edema right greater than left due to stroke.   Past Medical History:  Diagnosis Date  . Acute on chronic congestive heart failure (HCC)   . Anemia, secondary    SECONDARY TO ACUTE BLOOD LOSS  . Anxiety disorder   . Bloody stool 08/29/2011   intermittent along with constipation.   . 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 Depression   . 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 Dysphagia   . Hyperlipidemia   . Hypertension   . Lupus anticoagulant disorder (HCC) 1990  . Morbid obesity (HCC)   . PFO (patent foramen ovale)    TEE 2/10: EF 60%, trivial AI, mild Ao root dilatation, mod PFO with R-L shunting, atrial septal aneurysm;   echo 7/12: EF 65-70%, grade 1 diast dysfxn, mild MR, LVOT showed  severe obstruction  . Protein C deficiency (HCC)   . Protein S deficiency (HCC)   . Rectus sheath hematoma 7/12   required reversal of anticoagulation and c/b DVT req. IVC filter  . Seizure disorder [facility])     Past Surgical History:  Procedure Laterality Date  . dental extraction     multiple  . vena cavogram  12/2010   INFERIOR    Current Medications: Prior to Admission medications   Medication Sig Start Date End Date Taking? Authorizing Provider  Carboxymeth-Glycerin-Polysorb (REFRESH OPTIVE ADVANCED OP) Place 1 drop into both eyes 4 (four) times daily. Wait 3-5 minutes between eye meds    [provider]  ezetimibe (ZETIA) 10 MG tablet Take 10 mg by mouth daily.    [provider]  fish oil-omega-3 fatty acids 1000 MG capsule Take 1 g by mouth 2 (two) times daily.     [provider]  furosemide (LASIX) 40 MG tablet Take 1 tablet (40 mg total) by mouth 2 (two) times daily. 08/06/18 08/06/19  Alwyn Ren, MD  gabapentin (NEURONTIN) 300 MG capsule Take 300 mg by mouth 3 (three) times daily.      [provider]  Melatonin 3 MG TABS Take 3 mg by mouth at bedtime.    [provider]  metoprolol tartrate (LOPRESSOR) 25 MG tablet Take 25 mg by mouth 2 (two)  times daily.     [provider]  omeprazole (PRILOSEC) 20 MG capsule Take 20 mg by mouth at bedtime.     [provider]  phenytoin (DILANTIN) 100 MG ER capsule Take 200 mg by mouth 2 (two) times daily.    [provider]  potassium chloride (K-DUR) 10 MEQ tablet Take 2 tablets (20 mEq total) by mouth daily. 08/06/18   Alwyn Ren, MD  RESTASIS 0.05 % ophthalmic emulsion Place 1 drop into both eyes 2 (two) times daily. 06/03/18   [provider]  rosuvastatin (CRESTOR) 40 MG tablet Take 40 mg by mouth at bedtime.     [provider]  Skin Protectants, Misc. (EUCERIN) cream Apply 1 application topically daily. Apply to areas of dry  skin once daily    [provider]  spironolactone (ALDACTONE) 25 MG tablet Take 1 tablet (25 mg total) by mouth daily. 08/07/18   Alwyn Ren, MD  traZODone (DESYREL) 50 MG tablet Take 50 mg by mouth at bedtime. 07/08/18   [provider]  venlafaxine (EFFEXOR) 75 MG tablet Take 75 mg by mouth daily. 07/15/18   [provider]  warfarin (COUMADIN) 2 MG tablet Take 2 mg by mouth See admin instructions. Take with 2.5 mg to equal 4.5 mg daily    [provider]  warfarin (COUMADIN) 2.5 MG tablet Take 2.5 mg by mouth See admin instructions. Take with 2.0 mg daily to equal 4.5 mg daily    [provider]    Allergies:   Fenofibrate   Social History   Socioeconomic History  . Marital status: Single    Spouse name: Not on file  . Number of children: Not on file  . Years of education: Not on file  . Highest education level: Not on file  Occupational History  . Not on file  Social Needs  . Financial resource strain: Not on file  . Food insecurity:    Worry: Not on file    Inability: Not on file  . Transportation needs:    Medical: Not on file    Non-medical: Not on file  Tobacco Use  . Smoking status: Former Games developer  . Smokeless tobacco: Never Used  Substance and Sexual Activity  . 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 Drug use: No  . Sexual activity: Not on file  Lifestyle  . Physical activity:    Days per week: Not on file    Minutes per session: Not on file  . Stress: Not on file  Relationships  . Social connections:    Talks on phone: Not on file    Gets together: Not on file    Attends religious service: Not on file    Active member of club or organization: Not on file    Attends meetings of clubs or organizations: Not on file    Relationship status: Not on file  Other Topics Concern  . Not on file  Social History Narrative  . Not on file     Family History:  The patient's family history is not on file.    ROS:   Please see the history of present illness.    ROS All other systems reviewed and are negative.   PHYSICAL EXAM:   VS:  BP 128/70   Pulse 63   Ht 6\' 1"  (1.854 m)   Wt (!) 335 lb (152 kg) Comment: pt reported wt as he was unable  to stand  SpO2 92%   BMI 44.20 kg/m    GEN: Well nourished, well developed, in no acute distress  HEENT: normal  Neck: no JVD, carotid bruits, or masses Cardiac: RRR; no murmurs, rubs, or gallops,1 + BL LE R > L  edema  Respiratory:  clear to auscultation bilaterally, normal work of breathing GI: soft, nontender, nondistended, + BS MS: no deformity or atrophy  Skin: warm and dry, no rash Neuro:  Alert and Oriented x 3, Strength and sensation are intact Psych: euthymic mood, full affect  Wt Readings from Last 3 Encounters:  08/18/18 (!) 335 lb (152 kg)  08/06/18 (!) 303 lb (137.4 kg)  03/18/14 (!) 313 lb (142 kg)      Studies/Labs Reviewed:   EKG:  EKG is ordered today.  The ekg ordered today demonstrates afib at rate of 62 bpm  Recent Labs: 08/02/2018: B Natriuretic Peptide 234.9 08/04/2018: ALT 18; Hemoglobin 14.8; Magnesium 1.9; Platelets 234; TSH 2.966 08/06/2018: BUN 20; Creatinine, Ser 0.63; Potassium 3.6; Sodium 141   Lipid Panel    Component Value Date/Time   CHOL 194 08/05/2018 0401   TRIG 137 08/05/2018 0401   HDL 51 08/05/2018 0401   CHOLHDL 3.8 08/05/2018 0401   VLDL 27 08/05/2018 0401   LDLCALC 116 (H) 08/05/2018 0401    Additional studies/ records that were reviewed today include:   Echocardiogram 08/04/2018 IMPRESSIONS  1. The left ventricle has normal systolic function with an ejection fraction of 60-65%. The cavity size was normal. There is moderate asymmetric septal left ventricular hypertrophy. Left ventricular diastology could not be evaluated secondary to atrial  fibrillation. 2. The right ventricle has normal systolic function. The cavity was normal. There is no increase in right ventricular wall  thickness. Right ventricular systolic pressure could not be assessed. 3. The tricuspid valve is normal in structure. 4. The pulmonic valve was normal in structure. 5. There is moderate dilatation of the aortic root at 8mm and of the ascending aorta measuring 45 mm. 6. Left atrial size was mildly dilated. 7. The aortic valve is tricuspid Mild thickening of the aortic valve Aortic valve regurgitation is mild by color flow Doppler. 8. The mitral valve is normal in structure. 9. The inferior vena cava was dilated in size with >50% respiratory variability.    ASSESSMENT & PLAN:    1. Chronic diastolic CHF -unable to get daily weight due to body habituate and R sided hemiparesis.  He is trying to cut back on fluids.  Continue current medication.  Check BMET today.  He may take additional Lasix 40 mg for dyspnea or orthopnea.  He has chronic lower extremity edema due to residual right-sided hemiparesis.  2. Atrial flutter -Rate controlled.  On Coumadin for anticoagulation.  No bleeding issue.  3. HTN -Blood pressure controlled on current medication.      Medication Adjustments/Labs and Tests Ordered: Current medicines are reviewed at length with the patient today.  Concerns regarding medicines are outlined above.  Medication changes, Labs and Tests ordered today are listed in the Patient Instructions below. Patient Instructions  Medication Instructions:  Your physician recommends that you continue on your current medications as directed. Please refer to the Current Medication list given to you today.  If you need a refill on your cardiac medications before your next appointment, please call your pharmacy.   Lab work: TODAY: BMET If you have labs (blood work) drawn today and your tests are completely normal, you will receive your results  only by: Marland Kitchen MyChart Message (if you have MyChart) OR . A paper copy in the mail If you have any lab test that is abnormal or we need to change  your treatment, we will call you to review the results.  Testing/Procedures: NONE  Follow-Up: At [facility], you and your health needs are our priority.  As part of our continuing mission to provide you with exceptional heart care, we have created designated Provider Care Teams.  These Care Teams include your primary Cardiologist (physician) and Advanced Practice Providers (APPs -  Physician Assistants and Nurse Practitioners) who all work together to provide you with the care you need, when you need it. You will need a follow up appointment in 3-4 months.  Please call our office 2 months in advance to schedule this appointment.  You may see DR. NELSON or one of the following Advanced Practice Providers on your designated Care Team:   Robbie Lis, PA-C Dayna Dunn, PA-C . Jacolyn Reedy, PA-C  Any Other Special Instructions Will Be Listed Below (If Applicable).       Lorelei Pont, [place]  08/18/2018 9:22 AM    [facility] [address] [place], [place], [place]  [address] Phone: [phone]; Fax: [phone]

## 2018-08-18 ENCOUNTER — Encounter: Payer: Self-pay | Admitting: Physician Assistant

## 2018-08-18 ENCOUNTER — Ambulatory Visit (INDEPENDENT_AMBULATORY_CARE_PROVIDER_SITE_OTHER): Payer: Medicare Other | Admitting: Physician Assistant

## 2018-08-18 ENCOUNTER — Other Ambulatory Visit: Payer: Self-pay

## 2018-08-18 VITALS — BP 128/70 | HR 63 | Ht 73.0 in | Wt 335.0 lb

## 2018-08-18 DIAGNOSIS — I4821 Permanent atrial fibrillation: Secondary | ICD-10-CM

## 2018-08-18 DIAGNOSIS — I5032 Chronic diastolic (congestive) heart failure: Secondary | ICD-10-CM

## 2018-08-18 DIAGNOSIS — I10 Essential (primary) hypertension: Secondary | ICD-10-CM

## 2018-08-18 DIAGNOSIS — Z79899 Other long term (current) drug therapy: Secondary | ICD-10-CM

## 2018-08-18 LAB — BASIC METABOLIC PANEL
BUN/Creatinine Ratio: 13 (ref 10–24)
BUN: 9 mg/dL (ref 8–27)
CHLORIDE: 101 mmol/L (ref 96–106)
CO2: 28 mmol/L (ref 20–29)
Calcium: 8.6 mg/dL (ref 8.6–10.2)
Creatinine, Ser: 0.69 mg/dL — ABNORMAL LOW (ref 0.76–1.27)
GFR calc Af Amer: 119 mL/min/{1.73_m2} (ref >59)
GFR calc non Af Amer: 103 mL/min/{1.73_m2} (ref >59)
GLUCOSE: 82 mg/dL (ref 65–99)
POTASSIUM: 4.9 mmol/L (ref 3.5–5.2)
Sodium: 142 mmol/L (ref 134–144)

## 2018-08-18 NOTE — Patient Instructions (Signed)
Medication Instructions:  Your physician recommends that you continue on your current medications as directed. Please refer to the Current Medication list given to you today.  If you need a refill on your cardiac medications before your next appointment, please call your pharmacy.   Lab work: TODAY: BMET If you have labs (blood work) drawn today and your tests are completely normal, you will receive your results only by: Marland Kitchen MyChart Message (if you have MyChart) OR . A paper copy in the mail If you have any lab test that is abnormal or we need to change your treatment, we will call you to review the results.  Testing/Procedures: NONE  Follow-Up: At [facility], you and your health needs are our priority.  As part of our continuing mission to provide you with exceptional heart care, we have created designated Provider Care Teams.  These Care Teams include your primary Cardiologist (physician) and Advanced Practice Providers (APPs -  Physician Assistants and Nurse Practitioners) who all work together to provide you with the care you need, when you need it. You will need a follow up appointment in 3-4 months.  Please call our office 2 months in advance to schedule this appointment.  You may see DR. NELSON or one of the following Advanced Practice Providers on your designated Care Team:   Robbie Lis, PA-C Dayna Dunn, PA-C . Jacolyn Reedy, PA-C  Any Other Special Instructions Will Be Listed Below (If Applicable).

## 2018-08-19 DIAGNOSIS — I10 Essential (primary) hypertension: Secondary | ICD-10-CM | POA: Diagnosis not present

## 2018-08-19 DIAGNOSIS — D6869 Other thrombophilia: Secondary | ICD-10-CM | POA: Diagnosis not present

## 2018-08-19 DIAGNOSIS — K219 Gastro-esophageal reflux disease without esophagitis: Secondary | ICD-10-CM | POA: Diagnosis not present

## 2018-08-19 DIAGNOSIS — G40909 Epilepsy, unspecified, not intractable, without status epilepticus: Secondary | ICD-10-CM | POA: Diagnosis not present

## 2018-08-19 DIAGNOSIS — I82409 Acute embolism and thrombosis of unspecified deep veins of unspecified lower extremity: Secondary | ICD-10-CM | POA: Diagnosis not present

## 2018-08-19 DIAGNOSIS — I69359 Hemiplegia and hemiparesis following cerebral infarction affecting unspecified side: Secondary | ICD-10-CM | POA: Diagnosis not present

## 2018-08-19 DIAGNOSIS — I5030 Unspecified diastolic (congestive) heart failure: Secondary | ICD-10-CM | POA: Diagnosis not present

## 2018-08-19 DIAGNOSIS — I4891 Unspecified atrial fibrillation: Secondary | ICD-10-CM | POA: Diagnosis not present

## 2018-08-19 DIAGNOSIS — F322 Major depressive disorder, single episode, severe without psychotic features: Secondary | ICD-10-CM | POA: Diagnosis not present

## 2018-09-23 DIAGNOSIS — I4891 Unspecified atrial fibrillation: Secondary | ICD-10-CM | POA: Diagnosis not present

## 2018-09-23 DIAGNOSIS — I5030 Unspecified diastolic (congestive) heart failure: Secondary | ICD-10-CM | POA: Diagnosis not present

## 2018-09-23 DIAGNOSIS — K219 Gastro-esophageal reflux disease without esophagitis: Secondary | ICD-10-CM | POA: Diagnosis not present

## 2018-09-23 DIAGNOSIS — G40909 Epilepsy, unspecified, not intractable, without status epilepticus: Secondary | ICD-10-CM | POA: Diagnosis not present

## 2018-09-23 DIAGNOSIS — I10 Essential (primary) hypertension: Secondary | ICD-10-CM | POA: Diagnosis not present

## 2018-09-23 DIAGNOSIS — I69359 Hemiplegia and hemiparesis following cerebral infarction affecting unspecified side: Secondary | ICD-10-CM | POA: Diagnosis not present

## 2018-09-23 DIAGNOSIS — F322 Major depressive disorder, single episode, severe without psychotic features: Secondary | ICD-10-CM | POA: Diagnosis not present

## 2018-09-23 DIAGNOSIS — I82409 Acute embolism and thrombosis of unspecified deep veins of unspecified lower extremity: Secondary | ICD-10-CM | POA: Diagnosis not present

## 2018-09-23 DIAGNOSIS — D6869 Other thrombophilia: Secondary | ICD-10-CM | POA: Diagnosis not present

## 2018-10-21 DIAGNOSIS — I82409 Acute embolism and thrombosis of unspecified deep veins of unspecified lower extremity: Secondary | ICD-10-CM | POA: Diagnosis not present

## 2018-10-21 DIAGNOSIS — D6869 Other thrombophilia: Secondary | ICD-10-CM | POA: Diagnosis not present

## 2018-10-21 DIAGNOSIS — I4891 Unspecified atrial fibrillation: Secondary | ICD-10-CM | POA: Diagnosis not present

## 2018-10-21 DIAGNOSIS — K219 Gastro-esophageal reflux disease without esophagitis: Secondary | ICD-10-CM | POA: Diagnosis not present

## 2018-10-21 DIAGNOSIS — F322 Major depressive disorder, single episode, severe without psychotic features: Secondary | ICD-10-CM | POA: Diagnosis not present

## 2018-10-21 DIAGNOSIS — I69359 Hemiplegia and hemiparesis following cerebral infarction affecting unspecified side: Secondary | ICD-10-CM | POA: Diagnosis not present

## 2018-10-21 DIAGNOSIS — G40909 Epilepsy, unspecified, not intractable, without status epilepticus: Secondary | ICD-10-CM | POA: Diagnosis not present

## 2018-10-21 DIAGNOSIS — I5030 Unspecified diastolic (congestive) heart failure: Secondary | ICD-10-CM | POA: Diagnosis not present

## 2018-10-21 DIAGNOSIS — I10 Essential (primary) hypertension: Secondary | ICD-10-CM | POA: Diagnosis not present

## 2018-10-25 DIAGNOSIS — Z20828 Contact with and (suspected) exposure to other viral communicable diseases: Secondary | ICD-10-CM | POA: Diagnosis not present

## 2018-11-05 DIAGNOSIS — M6281 Muscle weakness (generalized): Secondary | ICD-10-CM | POA: Diagnosis not present

## 2018-11-05 DIAGNOSIS — I69852 Hemiplegia and hemiparesis following other cerebrovascular disease affecting left dominant side: Secondary | ICD-10-CM | POA: Diagnosis not present

## 2018-11-05 DIAGNOSIS — I509 Heart failure, unspecified: Secondary | ICD-10-CM | POA: Diagnosis not present

## 2018-11-05 DIAGNOSIS — M24541 Contracture, right hand: Secondary | ICD-10-CM | POA: Diagnosis not present

## 2018-11-05 DIAGNOSIS — M810 Age-related osteoporosis without current pathological fracture: Secondary | ICD-10-CM | POA: Diagnosis not present

## 2018-11-06 DIAGNOSIS — M810 Age-related osteoporosis without current pathological fracture: Secondary | ICD-10-CM | POA: Diagnosis not present

## 2018-11-06 DIAGNOSIS — M24541 Contracture, right hand: Secondary | ICD-10-CM | POA: Diagnosis not present

## 2018-11-06 DIAGNOSIS — M6281 Muscle weakness (generalized): Secondary | ICD-10-CM | POA: Diagnosis not present

## 2018-11-06 DIAGNOSIS — I509 Heart failure, unspecified: Secondary | ICD-10-CM | POA: Diagnosis not present

## 2018-11-06 DIAGNOSIS — I69852 Hemiplegia and hemiparesis following other cerebrovascular disease affecting left dominant side: Secondary | ICD-10-CM | POA: Diagnosis not present

## 2018-11-08 DIAGNOSIS — I509 Heart failure, unspecified: Secondary | ICD-10-CM | POA: Diagnosis not present

## 2018-11-08 DIAGNOSIS — M24541 Contracture, right hand: Secondary | ICD-10-CM | POA: Diagnosis not present

## 2018-11-08 DIAGNOSIS — M810 Age-related osteoporosis without current pathological fracture: Secondary | ICD-10-CM | POA: Diagnosis not present

## 2018-11-08 DIAGNOSIS — I69852 Hemiplegia and hemiparesis following other cerebrovascular disease affecting left dominant side: Secondary | ICD-10-CM | POA: Diagnosis not present

## 2018-11-08 DIAGNOSIS — M6281 Muscle weakness (generalized): Secondary | ICD-10-CM | POA: Diagnosis not present

## 2018-11-09 DIAGNOSIS — I509 Heart failure, unspecified: Secondary | ICD-10-CM | POA: Diagnosis not present

## 2018-11-09 DIAGNOSIS — M24541 Contracture, right hand: Secondary | ICD-10-CM | POA: Diagnosis not present

## 2018-11-09 DIAGNOSIS — M6281 Muscle weakness (generalized): Secondary | ICD-10-CM | POA: Diagnosis not present

## 2018-11-09 DIAGNOSIS — M810 Age-related osteoporosis without current pathological fracture: Secondary | ICD-10-CM | POA: Diagnosis not present

## 2018-11-09 DIAGNOSIS — I69852 Hemiplegia and hemiparesis following other cerebrovascular disease affecting left dominant side: Secondary | ICD-10-CM | POA: Diagnosis not present

## 2018-11-10 DIAGNOSIS — I509 Heart failure, unspecified: Secondary | ICD-10-CM | POA: Diagnosis not present

## 2018-11-10 DIAGNOSIS — M6281 Muscle weakness (generalized): Secondary | ICD-10-CM | POA: Diagnosis not present

## 2018-11-10 DIAGNOSIS — M810 Age-related osteoporosis without current pathological fracture: Secondary | ICD-10-CM | POA: Diagnosis not present

## 2018-11-10 DIAGNOSIS — I69852 Hemiplegia and hemiparesis following other cerebrovascular disease affecting left dominant side: Secondary | ICD-10-CM | POA: Diagnosis not present

## 2018-11-10 DIAGNOSIS — M24541 Contracture, right hand: Secondary | ICD-10-CM | POA: Diagnosis not present

## 2018-11-11 DIAGNOSIS — M6281 Muscle weakness (generalized): Secondary | ICD-10-CM | POA: Diagnosis not present

## 2018-11-11 DIAGNOSIS — M24541 Contracture, right hand: Secondary | ICD-10-CM | POA: Diagnosis not present

## 2018-11-11 DIAGNOSIS — I509 Heart failure, unspecified: Secondary | ICD-10-CM | POA: Diagnosis not present

## 2018-11-11 DIAGNOSIS — M810 Age-related osteoporosis without current pathological fracture: Secondary | ICD-10-CM | POA: Diagnosis not present

## 2018-11-11 DIAGNOSIS — I69852 Hemiplegia and hemiparesis following other cerebrovascular disease affecting left dominant side: Secondary | ICD-10-CM | POA: Diagnosis not present

## 2018-11-12 DIAGNOSIS — I509 Heart failure, unspecified: Secondary | ICD-10-CM | POA: Diagnosis not present

## 2018-11-12 DIAGNOSIS — I69852 Hemiplegia and hemiparesis following other cerebrovascular disease affecting left dominant side: Secondary | ICD-10-CM | POA: Diagnosis not present

## 2018-11-12 DIAGNOSIS — M810 Age-related osteoporosis without current pathological fracture: Secondary | ICD-10-CM | POA: Diagnosis not present

## 2018-11-12 DIAGNOSIS — M6281 Muscle weakness (generalized): Secondary | ICD-10-CM | POA: Diagnosis not present

## 2018-11-12 DIAGNOSIS — M24541 Contracture, right hand: Secondary | ICD-10-CM | POA: Diagnosis not present

## 2018-11-15 DIAGNOSIS — I6990 Unspecified sequelae of unspecified cerebrovascular disease: Secondary | ICD-10-CM | POA: Diagnosis not present

## 2018-11-15 DIAGNOSIS — E08319 Diabetes mellitus due to underlying condition with unspecified diabetic retinopathy without macular edema: Secondary | ICD-10-CM | POA: Diagnosis not present

## 2018-11-15 DIAGNOSIS — R569 Unspecified convulsions: Secondary | ICD-10-CM | POA: Diagnosis not present

## 2018-11-15 DIAGNOSIS — I509 Heart failure, unspecified: Secondary | ICD-10-CM | POA: Diagnosis not present

## 2018-11-15 DIAGNOSIS — Z7901 Long term (current) use of anticoagulants: Secondary | ICD-10-CM | POA: Diagnosis not present

## 2018-11-15 DIAGNOSIS — N390 Urinary tract infection, site not specified: Secondary | ICD-10-CM | POA: Diagnosis not present

## 2018-11-15 DIAGNOSIS — I69852 Hemiplegia and hemiparesis following other cerebrovascular disease affecting left dominant side: Secondary | ICD-10-CM | POA: Diagnosis not present

## 2018-11-15 DIAGNOSIS — M6281 Muscle weakness (generalized): Secondary | ICD-10-CM | POA: Diagnosis not present

## 2018-11-15 DIAGNOSIS — M810 Age-related osteoporosis without current pathological fracture: Secondary | ICD-10-CM | POA: Diagnosis not present

## 2018-11-15 DIAGNOSIS — M24541 Contracture, right hand: Secondary | ICD-10-CM | POA: Diagnosis not present

## 2018-11-16 DIAGNOSIS — M6281 Muscle weakness (generalized): Secondary | ICD-10-CM | POA: Diagnosis not present

## 2018-11-16 DIAGNOSIS — M24541 Contracture, right hand: Secondary | ICD-10-CM | POA: Diagnosis not present

## 2018-11-16 DIAGNOSIS — I69852 Hemiplegia and hemiparesis following other cerebrovascular disease affecting left dominant side: Secondary | ICD-10-CM | POA: Diagnosis not present

## 2018-11-16 DIAGNOSIS — I509 Heart failure, unspecified: Secondary | ICD-10-CM | POA: Diagnosis not present

## 2018-11-16 DIAGNOSIS — M810 Age-related osteoporosis without current pathological fracture: Secondary | ICD-10-CM | POA: Diagnosis not present

## 2018-11-17 DIAGNOSIS — M810 Age-related osteoporosis without current pathological fracture: Secondary | ICD-10-CM | POA: Diagnosis not present

## 2018-11-17 DIAGNOSIS — M24541 Contracture, right hand: Secondary | ICD-10-CM | POA: Diagnosis not present

## 2018-11-17 DIAGNOSIS — I69852 Hemiplegia and hemiparesis following other cerebrovascular disease affecting left dominant side: Secondary | ICD-10-CM | POA: Diagnosis not present

## 2018-11-17 DIAGNOSIS — M6281 Muscle weakness (generalized): Secondary | ICD-10-CM | POA: Diagnosis not present

## 2018-11-17 DIAGNOSIS — I509 Heart failure, unspecified: Secondary | ICD-10-CM | POA: Diagnosis not present

## 2018-11-18 DIAGNOSIS — I509 Heart failure, unspecified: Secondary | ICD-10-CM | POA: Diagnosis not present

## 2018-11-18 DIAGNOSIS — M24541 Contracture, right hand: Secondary | ICD-10-CM | POA: Diagnosis not present

## 2018-11-18 DIAGNOSIS — I69852 Hemiplegia and hemiparesis following other cerebrovascular disease affecting left dominant side: Secondary | ICD-10-CM | POA: Diagnosis not present

## 2018-11-18 DIAGNOSIS — M6281 Muscle weakness (generalized): Secondary | ICD-10-CM | POA: Diagnosis not present

## 2018-11-18 DIAGNOSIS — M810 Age-related osteoporosis without current pathological fracture: Secondary | ICD-10-CM | POA: Diagnosis not present

## 2018-11-19 DIAGNOSIS — D6869 Other thrombophilia: Secondary | ICD-10-CM | POA: Diagnosis not present

## 2018-11-19 DIAGNOSIS — M6281 Muscle weakness (generalized): Secondary | ICD-10-CM | POA: Diagnosis not present

## 2018-11-19 DIAGNOSIS — K219 Gastro-esophageal reflux disease without esophagitis: Secondary | ICD-10-CM | POA: Diagnosis not present

## 2018-11-19 DIAGNOSIS — M810 Age-related osteoporosis without current pathological fracture: Secondary | ICD-10-CM | POA: Diagnosis not present

## 2018-11-19 DIAGNOSIS — I5030 Unspecified diastolic (congestive) heart failure: Secondary | ICD-10-CM | POA: Diagnosis not present

## 2018-11-19 DIAGNOSIS — I509 Heart failure, unspecified: Secondary | ICD-10-CM | POA: Diagnosis not present

## 2018-11-19 DIAGNOSIS — I10 Essential (primary) hypertension: Secondary | ICD-10-CM | POA: Diagnosis not present

## 2018-11-19 DIAGNOSIS — I69852 Hemiplegia and hemiparesis following other cerebrovascular disease affecting left dominant side: Secondary | ICD-10-CM | POA: Diagnosis not present

## 2018-11-19 DIAGNOSIS — I82409 Acute embolism and thrombosis of unspecified deep veins of unspecified lower extremity: Secondary | ICD-10-CM | POA: Diagnosis not present

## 2018-11-19 DIAGNOSIS — I69359 Hemiplegia and hemiparesis following cerebral infarction affecting unspecified side: Secondary | ICD-10-CM | POA: Diagnosis not present

## 2018-11-19 DIAGNOSIS — G40909 Epilepsy, unspecified, not intractable, without status epilepticus: Secondary | ICD-10-CM | POA: Diagnosis not present

## 2018-11-19 DIAGNOSIS — M24541 Contracture, right hand: Secondary | ICD-10-CM | POA: Diagnosis not present

## 2018-11-19 DIAGNOSIS — F322 Major depressive disorder, single episode, severe without psychotic features: Secondary | ICD-10-CM | POA: Diagnosis not present

## 2018-11-19 DIAGNOSIS — I4891 Unspecified atrial fibrillation: Secondary | ICD-10-CM | POA: Diagnosis not present

## 2018-11-22 DIAGNOSIS — M24541 Contracture, right hand: Secondary | ICD-10-CM | POA: Diagnosis not present

## 2018-11-22 DIAGNOSIS — I69852 Hemiplegia and hemiparesis following other cerebrovascular disease affecting left dominant side: Secondary | ICD-10-CM | POA: Diagnosis not present

## 2018-11-22 DIAGNOSIS — M6281 Muscle weakness (generalized): Secondary | ICD-10-CM | POA: Diagnosis not present

## 2018-11-22 DIAGNOSIS — I509 Heart failure, unspecified: Secondary | ICD-10-CM | POA: Diagnosis not present

## 2018-11-22 DIAGNOSIS — I517 Cardiomegaly: Secondary | ICD-10-CM | POA: Diagnosis not present

## 2018-11-22 DIAGNOSIS — M810 Age-related osteoporosis without current pathological fracture: Secondary | ICD-10-CM | POA: Diagnosis not present

## 2018-11-23 DIAGNOSIS — M810 Age-related osteoporosis without current pathological fracture: Secondary | ICD-10-CM | POA: Diagnosis not present

## 2018-11-23 DIAGNOSIS — Z20828 Contact with and (suspected) exposure to other viral communicable diseases: Secondary | ICD-10-CM | POA: Diagnosis not present

## 2018-11-23 DIAGNOSIS — I69852 Hemiplegia and hemiparesis following other cerebrovascular disease affecting left dominant side: Secondary | ICD-10-CM | POA: Diagnosis not present

## 2018-11-23 DIAGNOSIS — M6281 Muscle weakness (generalized): Secondary | ICD-10-CM | POA: Diagnosis not present

## 2018-11-23 DIAGNOSIS — Z1383 Encounter for screening for respiratory disorder NEC: Secondary | ICD-10-CM | POA: Diagnosis not present

## 2018-11-23 DIAGNOSIS — M24541 Contracture, right hand: Secondary | ICD-10-CM | POA: Diagnosis not present

## 2018-11-23 DIAGNOSIS — I509 Heart failure, unspecified: Secondary | ICD-10-CM | POA: Diagnosis not present

## 2018-11-24 DIAGNOSIS — M810 Age-related osteoporosis without current pathological fracture: Secondary | ICD-10-CM | POA: Diagnosis not present

## 2018-11-24 DIAGNOSIS — M6281 Muscle weakness (generalized): Secondary | ICD-10-CM | POA: Diagnosis not present

## 2018-11-24 DIAGNOSIS — M24541 Contracture, right hand: Secondary | ICD-10-CM | POA: Diagnosis not present

## 2018-11-24 DIAGNOSIS — I69852 Hemiplegia and hemiparesis following other cerebrovascular disease affecting left dominant side: Secondary | ICD-10-CM | POA: Diagnosis not present

## 2018-11-24 DIAGNOSIS — I509 Heart failure, unspecified: Secondary | ICD-10-CM | POA: Diagnosis not present

## 2018-11-25 ENCOUNTER — Telehealth: Payer: Self-pay

## 2018-11-25 DIAGNOSIS — I509 Heart failure, unspecified: Secondary | ICD-10-CM | POA: Diagnosis not present

## 2018-11-25 DIAGNOSIS — M810 Age-related osteoporosis without current pathological fracture: Secondary | ICD-10-CM | POA: Diagnosis not present

## 2018-11-25 DIAGNOSIS — M24541 Contracture, right hand: Secondary | ICD-10-CM | POA: Diagnosis not present

## 2018-11-25 DIAGNOSIS — M6281 Muscle weakness (generalized): Secondary | ICD-10-CM | POA: Diagnosis not present

## 2018-11-25 DIAGNOSIS — I69852 Hemiplegia and hemiparesis following other cerebrovascular disease affecting left dominant side: Secondary | ICD-10-CM | POA: Diagnosis not present

## 2018-11-25 NOTE — Telephone Encounter (Signed)
Called pt to set up OV 6/22 with Milas Gain. Pt agreed to visit but it has to be set up through his nursing home since they provide transportation. [facility] in [place] [phone]. I left a message asking them to call the office.

## 2018-11-26 DIAGNOSIS — I509 Heart failure, unspecified: Secondary | ICD-10-CM | POA: Diagnosis not present

## 2018-11-26 DIAGNOSIS — M810 Age-related osteoporosis without current pathological fracture: Secondary | ICD-10-CM | POA: Diagnosis not present

## 2018-11-26 DIAGNOSIS — M6281 Muscle weakness (generalized): Secondary | ICD-10-CM | POA: Diagnosis not present

## 2018-11-26 DIAGNOSIS — M24541 Contracture, right hand: Secondary | ICD-10-CM | POA: Diagnosis not present

## 2018-11-26 DIAGNOSIS — I69852 Hemiplegia and hemiparesis following other cerebrovascular disease affecting left dominant side: Secondary | ICD-10-CM | POA: Diagnosis not present

## 2018-11-29 DIAGNOSIS — Z20828 Contact with and (suspected) exposure to other viral communicable diseases: Secondary | ICD-10-CM | POA: Diagnosis not present

## 2018-11-29 DIAGNOSIS — M24541 Contracture, right hand: Secondary | ICD-10-CM | POA: Diagnosis not present

## 2018-11-29 DIAGNOSIS — I509 Heart failure, unspecified: Secondary | ICD-10-CM | POA: Diagnosis not present

## 2018-11-29 DIAGNOSIS — Z1383 Encounter for screening for respiratory disorder NEC: Secondary | ICD-10-CM | POA: Diagnosis not present

## 2018-11-29 DIAGNOSIS — M810 Age-related osteoporosis without current pathological fracture: Secondary | ICD-10-CM | POA: Diagnosis not present

## 2018-11-29 DIAGNOSIS — M6281 Muscle weakness (generalized): Secondary | ICD-10-CM | POA: Diagnosis not present

## 2018-11-29 DIAGNOSIS — I69852 Hemiplegia and hemiparesis following other cerebrovascular disease affecting left dominant side: Secondary | ICD-10-CM | POA: Diagnosis not present

## 2018-11-30 DIAGNOSIS — M24541 Contracture, right hand: Secondary | ICD-10-CM | POA: Diagnosis not present

## 2018-11-30 DIAGNOSIS — M810 Age-related osteoporosis without current pathological fracture: Secondary | ICD-10-CM | POA: Diagnosis not present

## 2018-11-30 DIAGNOSIS — I509 Heart failure, unspecified: Secondary | ICD-10-CM | POA: Diagnosis not present

## 2018-11-30 DIAGNOSIS — I69852 Hemiplegia and hemiparesis following other cerebrovascular disease affecting left dominant side: Secondary | ICD-10-CM | POA: Diagnosis not present

## 2018-11-30 DIAGNOSIS — M6281 Muscle weakness (generalized): Secondary | ICD-10-CM | POA: Diagnosis not present

## 2018-12-01 DIAGNOSIS — I69852 Hemiplegia and hemiparesis following other cerebrovascular disease affecting left dominant side: Secondary | ICD-10-CM | POA: Diagnosis not present

## 2018-12-01 DIAGNOSIS — I509 Heart failure, unspecified: Secondary | ICD-10-CM | POA: Diagnosis not present

## 2018-12-01 DIAGNOSIS — M24541 Contracture, right hand: Secondary | ICD-10-CM | POA: Diagnosis not present

## 2018-12-01 DIAGNOSIS — M6281 Muscle weakness (generalized): Secondary | ICD-10-CM | POA: Diagnosis not present

## 2018-12-01 DIAGNOSIS — M810 Age-related osteoporosis without current pathological fracture: Secondary | ICD-10-CM | POA: Diagnosis not present

## 2018-12-02 DIAGNOSIS — M810 Age-related osteoporosis without current pathological fracture: Secondary | ICD-10-CM | POA: Diagnosis not present

## 2018-12-02 DIAGNOSIS — M24541 Contracture, right hand: Secondary | ICD-10-CM | POA: Diagnosis not present

## 2018-12-02 DIAGNOSIS — M6281 Muscle weakness (generalized): Secondary | ICD-10-CM | POA: Diagnosis not present

## 2018-12-02 DIAGNOSIS — I509 Heart failure, unspecified: Secondary | ICD-10-CM | POA: Diagnosis not present

## 2018-12-02 DIAGNOSIS — I69852 Hemiplegia and hemiparesis following other cerebrovascular disease affecting left dominant side: Secondary | ICD-10-CM | POA: Diagnosis not present

## 2018-12-03 DIAGNOSIS — M6281 Muscle weakness (generalized): Secondary | ICD-10-CM | POA: Diagnosis not present

## 2018-12-03 DIAGNOSIS — M810 Age-related osteoporosis without current pathological fracture: Secondary | ICD-10-CM | POA: Diagnosis not present

## 2018-12-03 DIAGNOSIS — M24541 Contracture, right hand: Secondary | ICD-10-CM | POA: Diagnosis not present

## 2018-12-03 DIAGNOSIS — I69852 Hemiplegia and hemiparesis following other cerebrovascular disease affecting left dominant side: Secondary | ICD-10-CM | POA: Diagnosis not present

## 2018-12-03 DIAGNOSIS — I509 Heart failure, unspecified: Secondary | ICD-10-CM | POA: Diagnosis not present

## 2018-12-06 DIAGNOSIS — I509 Heart failure, unspecified: Secondary | ICD-10-CM | POA: Diagnosis not present

## 2018-12-06 DIAGNOSIS — M24541 Contracture, right hand: Secondary | ICD-10-CM | POA: Diagnosis not present

## 2018-12-06 DIAGNOSIS — I69852 Hemiplegia and hemiparesis following other cerebrovascular disease affecting left dominant side: Secondary | ICD-10-CM | POA: Diagnosis not present

## 2018-12-06 DIAGNOSIS — M810 Age-related osteoporosis without current pathological fracture: Secondary | ICD-10-CM | POA: Diagnosis not present

## 2018-12-06 DIAGNOSIS — M6281 Muscle weakness (generalized): Secondary | ICD-10-CM | POA: Diagnosis not present

## 2018-12-07 DIAGNOSIS — M810 Age-related osteoporosis without current pathological fracture: Secondary | ICD-10-CM | POA: Diagnosis not present

## 2018-12-07 DIAGNOSIS — M24541 Contracture, right hand: Secondary | ICD-10-CM | POA: Diagnosis not present

## 2018-12-07 DIAGNOSIS — M6281 Muscle weakness (generalized): Secondary | ICD-10-CM | POA: Diagnosis not present

## 2018-12-07 DIAGNOSIS — I69852 Hemiplegia and hemiparesis following other cerebrovascular disease affecting left dominant side: Secondary | ICD-10-CM | POA: Diagnosis not present

## 2018-12-07 DIAGNOSIS — I509 Heart failure, unspecified: Secondary | ICD-10-CM | POA: Diagnosis not present

## 2018-12-08 DIAGNOSIS — I509 Heart failure, unspecified: Secondary | ICD-10-CM | POA: Diagnosis not present

## 2018-12-08 DIAGNOSIS — M810 Age-related osteoporosis without current pathological fracture: Secondary | ICD-10-CM | POA: Diagnosis not present

## 2018-12-08 DIAGNOSIS — I69852 Hemiplegia and hemiparesis following other cerebrovascular disease affecting left dominant side: Secondary | ICD-10-CM | POA: Diagnosis not present

## 2018-12-08 DIAGNOSIS — R262 Difficulty in walking, not elsewhere classified: Secondary | ICD-10-CM | POA: Diagnosis not present

## 2018-12-08 DIAGNOSIS — M24541 Contracture, right hand: Secondary | ICD-10-CM | POA: Diagnosis not present

## 2018-12-08 DIAGNOSIS — M6281 Muscle weakness (generalized): Secondary | ICD-10-CM | POA: Diagnosis not present

## 2018-12-09 DIAGNOSIS — I509 Heart failure, unspecified: Secondary | ICD-10-CM | POA: Diagnosis not present

## 2018-12-09 DIAGNOSIS — I69852 Hemiplegia and hemiparesis following other cerebrovascular disease affecting left dominant side: Secondary | ICD-10-CM | POA: Diagnosis not present

## 2018-12-09 DIAGNOSIS — R262 Difficulty in walking, not elsewhere classified: Secondary | ICD-10-CM | POA: Diagnosis not present

## 2018-12-09 DIAGNOSIS — M6281 Muscle weakness (generalized): Secondary | ICD-10-CM | POA: Diagnosis not present

## 2018-12-09 DIAGNOSIS — M810 Age-related osteoporosis without current pathological fracture: Secondary | ICD-10-CM | POA: Diagnosis not present

## 2018-12-09 DIAGNOSIS — M24541 Contracture, right hand: Secondary | ICD-10-CM | POA: Diagnosis not present

## 2018-12-10 DIAGNOSIS — M6281 Muscle weakness (generalized): Secondary | ICD-10-CM | POA: Diagnosis not present

## 2018-12-10 DIAGNOSIS — M24541 Contracture, right hand: Secondary | ICD-10-CM | POA: Diagnosis not present

## 2018-12-10 DIAGNOSIS — M810 Age-related osteoporosis without current pathological fracture: Secondary | ICD-10-CM | POA: Diagnosis not present

## 2018-12-10 DIAGNOSIS — I69852 Hemiplegia and hemiparesis following other cerebrovascular disease affecting left dominant side: Secondary | ICD-10-CM | POA: Diagnosis not present

## 2018-12-10 DIAGNOSIS — R262 Difficulty in walking, not elsewhere classified: Secondary | ICD-10-CM | POA: Diagnosis not present

## 2018-12-10 DIAGNOSIS — I509 Heart failure, unspecified: Secondary | ICD-10-CM | POA: Diagnosis not present

## 2018-12-24 DIAGNOSIS — G40909 Epilepsy, unspecified, not intractable, without status epilepticus: Secondary | ICD-10-CM | POA: Diagnosis not present

## 2018-12-24 DIAGNOSIS — F322 Major depressive disorder, single episode, severe without psychotic features: Secondary | ICD-10-CM | POA: Diagnosis not present

## 2018-12-24 DIAGNOSIS — I69359 Hemiplegia and hemiparesis following cerebral infarction affecting unspecified side: Secondary | ICD-10-CM | POA: Diagnosis not present

## 2018-12-24 DIAGNOSIS — K219 Gastro-esophageal reflux disease without esophagitis: Secondary | ICD-10-CM | POA: Diagnosis not present

## 2018-12-24 DIAGNOSIS — I5030 Unspecified diastolic (congestive) heart failure: Secondary | ICD-10-CM | POA: Diagnosis not present

## 2018-12-24 DIAGNOSIS — D6869 Other thrombophilia: Secondary | ICD-10-CM | POA: Diagnosis not present

## 2018-12-24 DIAGNOSIS — I4891 Unspecified atrial fibrillation: Secondary | ICD-10-CM | POA: Diagnosis not present

## 2018-12-24 DIAGNOSIS — I10 Essential (primary) hypertension: Secondary | ICD-10-CM | POA: Diagnosis not present

## 2018-12-24 DIAGNOSIS — I82409 Acute embolism and thrombosis of unspecified deep veins of unspecified lower extremity: Secondary | ICD-10-CM | POA: Diagnosis not present

## 2018-12-27 ENCOUNTER — Telehealth: Payer: Self-pay | Admitting: Cardiology

## 2018-12-27 NOTE — Telephone Encounter (Signed)
New message        COVID-19 Pre-Screening Questions:   In the past 7 to 10 days have you had a cough,  shortness of breath, headache, congestion, fever (100 or greater) body aches, chills, sore throat, or sudden loss of taste or sense of smell? Occasional cough   Have you been around anyone with known Covid 19. NO  Have you been around anyone who is awaiting Covid 19 test results in the past 7 to 10 days? NO  Have you been around anyone who has been exposed to Covid 19, or has mentioned symptoms of Covid 19 within the past 7 to 10 days? NO  If you have any concerns/questions about symptoms patients report during screening (either on the phone or at threshold). Contact the provider seeing the patient or DOD for further guidance.  If neither are available contact a member of the leadership team.

## 2018-12-27 NOTE — Telephone Encounter (Signed)
[place]

## 2018-12-28 ENCOUNTER — Other Ambulatory Visit: Payer: Self-pay

## 2018-12-28 ENCOUNTER — Encounter: Payer: Self-pay | Admitting: Cardiology

## 2018-12-28 ENCOUNTER — Ambulatory Visit (INDEPENDENT_AMBULATORY_CARE_PROVIDER_SITE_OTHER): Payer: Medicare Other | Admitting: Cardiology

## 2018-12-28 VITALS — BP 124/70 | HR 45 | Ht 73.0 in | Wt 335.0 lb

## 2018-12-28 DIAGNOSIS — I4891 Unspecified atrial fibrillation: Secondary | ICD-10-CM | POA: Diagnosis not present

## 2018-12-28 MED ORDER — METOPROLOL TARTRATE 25 MG PO TABS: 12.5000 mg | ORAL_TABLET | Freq: Two times a day (BID) | ORAL | 3 refills | Status: DC

## 2018-12-28 NOTE — Progress Notes (Signed)
12/28/2018 James ForthDavid B Rawling   May 31, 1956  [id number]  Primary Physician Georgann HousekeeperHusain, Karrar, MD Primary Cardiologist: Dr. Delton SeeNelson  Electrophysiologist: None   Reason for Visit/CC: f/u for chronic diastolic HF and atrial flutter   HPI:  James Villegas is a 63 y.o. 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 and + lupus anticoagulant, maintained on lovenox. Dr. Excell Seltzerooper evaluated him in 2013 for his PFO. No indication for closure. Medication w/ anticoagulation only was recommended. He was lost to f/u for a period of time and was recently [facility] to  [facility] for CHF and new onset flutter. He was seen by Dr. Delton SeeNelson.  Echocardiogram showed normal LV systolic function, mildly dilated LA. He was treated w/ IV diuretics and diuresed well. He was transitioned to PO diuretics.  He was placed on rate control therapy for atrial flutter management. Anticoagulation was continued. He had post hospital f/u in our office in March and was doing well and instructed to f/u again in 3 months time.   Today in f/u, he reports that he has been doing well. No issues since last visit. Denies CP, dyspnea, LEE, orthopnea, PND, palpitations, dizziness, syncope/ near syncope or weight gain. EKG shows persistent atrial flutter w/ SVR 45 bpm. BP is well controlled. No edema on exam. INRs are followed at nursing facility and have been therapeutic. No abnormal bleeding.    Cardiac Studies  2D Echo 08/04/18 IMPRESSIONS    1. The left ventricle has normal systolic function with an ejection fraction of 60-65%. The cavity size was normal. There is moderate asymmetric septal left ventricular hypertrophy. Left ventricular diastology could not be evaluated secondary to atrial  fibrillation.  2. The right ventricle has normal systolic function. The cavity was  normal. There is no increase in right ventricular wall thickness. Right ventricular systolic pressure could not be assessed.  3. The tricuspid valve is normal in structure.  4. The pulmonic valve was normal in structure.  5. There is moderate dilatation of the aortic root at 41mm and of the ascending aorta measuring 45 mm.  6. Left atrial size was mildly dilated.  7. The aortic valve is tricuspid Mild thickening of the aortic valve Aortic valve regurgitation is mild by color flow Doppler.  8. The mitral valve is normal in structure.  9. The inferior vena cava was dilated in size with >50% respiratory variability.   Current Meds  Medication Sig  . Carboxymeth-Glycerin-Polysorb (REFRESH OPTIVE ADVANCED OP) Place 1 drop into both eyes 4 (four) times daily. Wait 3-5 minutes between eye meds  . ezetimibe (ZETIA) 10 MG tablet Take 10 mg by mouth daily.  . fish oil-omega-3 fatty acids 1000 MG capsule Take 1 g by mouth 2 (two) times daily.   . furosemide (LASIX) 40 MG tablet Take 1 tablet (40 mg total) by mouth 2 (two) times daily.  Marland Kitchen. gabapentin (NEURONTIN) 300 MG capsule Take 300 mg by mouth 3 (three) times daily.    . Melatonin 3 MG TABS Take 3 mg by mouth at bedtime.  Marland Kitchen. omeprazole (PRILOSEC) 20 MG capsule Take 20 mg by mouth at bedtime.   . phenytoin (DILANTIN) 100 MG ER capsule Take 200 mg by mouth 2 (two) times daily.  . potassium chloride (K-DUR) 10 MEQ tablet Take 2 tablets (20 mEq total) by mouth daily.  .Marland Kitchen  RESTASIS 0.05 % ophthalmic emulsion Place 1 drop into both eyes 2 (two) times daily.  . rosuvastatin (CRESTOR) 40 MG tablet Take 40 mg by mouth at bedtime.   . Skin Protectants, Misc. (EUCERIN) cream Apply 1 application topically daily. Apply to areas of dry skin once daily  . spironolactone (ALDACTONE) 25 MG tablet Take 1 tablet (25 mg total) by mouth daily.  . traZODone (DESYREL) 50 MG tablet Take 50 mg by mouth at bedtime.  Marland Kitchen venlafaxine (EFFEXOR) 75 MG tablet Take 75 mg by mouth  daily.  Marland Kitchen warfarin (COUMADIN) 2 MG tablet Take 2 mg by mouth See admin instructions. Take with 2.5 mg to equal 4.5 mg daily  . warfarin (COUMADIN) 2.5 MG tablet Take 2.5 mg by mouth See admin instructions. Take with 2.0 mg daily to equal 4.5 mg daily  . [DISCONTINUED] metoprolol tartrate (LOPRESSOR) 25 MG tablet Take 25 mg by mouth 2 (two) times daily.    Allergies  Allergen Reactions  . Fenofibrate Other (See Comments)    Per MAR   Past Medical History:  Diagnosis Date  . Acute on chronic congestive heart failure ([place])   . Anemia, secondary    SECONDARY TO ACUTE BLOOD LOSS  . Anxiety disorder   . Bloody stool 08/29/2011   intermittent along with constipation.   . CVA (cerebral vascular accident) ([place]) 2010   large left MCA stroke with right hemiparesis  . Depression   . 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 Dysphagia   . Hyperlipidemia   . Hypertension   . Lupus anticoagulant disorder ([place]) 1990  . Morbid obesity ([place])   . PFO (patent foramen ovale)    TEE 2/10: EF 60%, trivial AI, mild Ao root dilatation, mod PFO with R-L shunting, atrial septal aneurysm;   echo 7/12: EF 65-70%, grade 1 diast dysfxn, mild MR, LVOT showed severe obstruction  . Protein C deficiency ([place])   . Protein S deficiency ([place])   . Rectus sheath hematoma 7/12   required reversal of anticoagulation and c/b DVT req. IVC filter  . Seizure disorder ([place])    No family history on file. Past Surgical History:  Procedure Laterality Date  . dental extraction     multiple  . vena cavogram  12/2010   INFERIOR   Social History   Socioeconomic History  . Marital status: Single    Spouse name: Not on file  . Number of children: Not on file  . Years of education: Not on file  . Highest education level: Not on file  Occupational History  . Not on file  Social Needs  . Financial resource strain: Not on file  . Food insecurity    Worry: Not on file     Inability: Not on file  . Transportation needs    Medical: Not on file    Non-medical: Not on file  Tobacco Use  . Smoking status: Former Research scientist (life sciences)  . Smokeless tobacco: Never Used  Substance and Sexual Activity  . 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 Drug use: No  . Sexual activity: Not on file  Lifestyle  . Physical activity    Days per week: Not on file    Minutes per session: Not on file  . Stress: Not on file  Relationships  . Social Herbalist on phone: Not on file    Gets together:  Not on file    Attends religious service: Not on file    Active member of club or organization: Not on file    Attends meetings of clubs or organizations: Not on file    Relationship status: Not on file  . Intimate partner violence    Fear of current or ex partner: Not on file    Emotionally abused: Not on file    Physically abused: Not on file    Forced sexual activity: Not on file  Other Topics Concern  . Not on file  Social History Narrative  . Not on file     Lipid Panel     Component Value Date/Time   CHOL 194 08/05/2018 0401   TRIG 137 08/05/2018 0401   HDL 51 08/05/2018 0401   CHOLHDL 3.8 08/05/2018 0401   VLDL 27 08/05/2018 0401   LDLCALC 116 (H) 08/05/2018 0401    Review of Systems: General: negative for chills, fever, night sweats or weight changes.  Cardiovascular: negative for chest pain, dyspnea on exertion, edema, orthopnea, palpitations, paroxysmal nocturnal dyspnea or shortness of breath Dermatological: negative for rash Respiratory: negative for cough or wheezing Urologic: negative for hematuria Abdominal: negative for nausea, vomiting, diarrhea, bright red blood per rectum, melena, or hematemesis Neurologic: negative for visual changes, syncope, or dizziness All other systems reviewed and are otherwise negative except as noted above.   Physical Exam:  Blood pressure 124/70, pulse (!) 45, height 6\' 1"  (1.854 m), weight (!) 335 lb (152 kg),  SpO2 91 %.  General appearance: alert, cooperative, no distress and morbidly obese Neck: no carotid bruit and no JVD Lungs: clear to auscultation bilaterally Heart: irregularly irregular rhythm Extremities: extremities normal, atraumatic, no cyanosis or edema Pulses: 2+ and symmetric Skin: Skin color, texture, turgor normal. No rashes or lesions Neurologic: Grossly normal  EKG atrial flutter 45 bpm, inferolateral TWIs, c/w prior EKGs -- personally reviewed   ASSESSMENT AND PLAN:   1. Persistent Atrial Flutter: first diagnosed in Feb 2020 and decision made at that time to treat w/ rate control therapy. Notes do not indicate any recommendations or plans to attempt rthythm control/ cardioversion. EKG today shows persistent atrial flutter w/ a SVR in the mid 40s. He is asymptomatic. BP stable. We will reduce dose of metoprolol to 12.5 mg BID due to bradycardia. Continue Coumadin. INRs followed and coumadin dosed by SNF.   2. Chronic Diastolic CHF: echo 07/2018 showed normal LVEF and moderate LVH. Volume appears stable. No edema on exam. No dyspnea. Continue Lasix and spironolactone. Recent BMP showed stable SCr and K. Weight is checked daily at SNF.   3. HTN: well controlled at 124/70  4. PFO: previously evaluated by Dr. Excell Seltzerooper. No plans for closure. Continue anticoagulation.    Follow-Up w/ Dr. Delton SeeNelson in 6 months.    Delmer IslamSimmons PA-C, [facility] 12/28/2018 2:10 PM

## 2018-12-28 NOTE — Patient Instructions (Signed)
Medication Instructions:  DECREASE Metoprolol Tartrate to 12.5 mg TWICE Daily If you need a refill on your cardiac medications before your next appointment, please call your pharmacy.   Lab work: none If you have labs (blood work) drawn today and your tests are completely normal, you will receive your results only by: Marland Kitchen MyChart Message (if you have MyChart) OR . A paper copy in the mail If you have any lab test that is abnormal or we need to change your treatment, we will call you to review the results.  Testing/Procedures: none  Follow-Up: At [facility], you and your health needs are our priority.  As part of our continuing mission to provide you with exceptional heart care, we have created designated Provider Care Teams.  These Care Teams include your primary Cardiologist (physician) and Advanced Practice Providers (APPs -  Physician Assistants and Nurse Practitioners) who all work together to provide you with the care you need, when you need it. You will need a follow up appointment in 6 months.  Please call our office 2 months in advance to schedule this appointment.  You may see Dr Meda Coffee or one of the following Advanced Practice Providers on your designated Care Team:   [place], PA-C Melina Copa, PA-C . Ermalinda Barrios, PA-C  Any Other Special Instructions Will Be Listed Below (If Applicable).

## 2018-12-30 DIAGNOSIS — I509 Heart failure, unspecified: Secondary | ICD-10-CM | POA: Diagnosis not present

## 2018-12-30 DIAGNOSIS — M810 Age-related osteoporosis without current pathological fracture: Secondary | ICD-10-CM | POA: Diagnosis not present

## 2018-12-30 DIAGNOSIS — M6281 Muscle weakness (generalized): Secondary | ICD-10-CM | POA: Diagnosis not present

## 2018-12-30 DIAGNOSIS — M24541 Contracture, right hand: Secondary | ICD-10-CM | POA: Diagnosis not present

## 2018-12-30 DIAGNOSIS — I69852 Hemiplegia and hemiparesis following other cerebrovascular disease affecting left dominant side: Secondary | ICD-10-CM | POA: Diagnosis not present

## 2018-12-30 DIAGNOSIS — R262 Difficulty in walking, not elsewhere classified: Secondary | ICD-10-CM | POA: Diagnosis not present

## 2018-12-31 DIAGNOSIS — R262 Difficulty in walking, not elsewhere classified: Secondary | ICD-10-CM | POA: Diagnosis not present

## 2018-12-31 DIAGNOSIS — I509 Heart failure, unspecified: Secondary | ICD-10-CM | POA: Diagnosis not present

## 2018-12-31 DIAGNOSIS — M810 Age-related osteoporosis without current pathological fracture: Secondary | ICD-10-CM | POA: Diagnosis not present

## 2018-12-31 DIAGNOSIS — I69852 Hemiplegia and hemiparesis following other cerebrovascular disease affecting left dominant side: Secondary | ICD-10-CM | POA: Diagnosis not present

## 2018-12-31 DIAGNOSIS — M24541 Contracture, right hand: Secondary | ICD-10-CM | POA: Diagnosis not present

## 2018-12-31 DIAGNOSIS — M6281 Muscle weakness (generalized): Secondary | ICD-10-CM | POA: Diagnosis not present

## 2019-01-01 DIAGNOSIS — M810 Age-related osteoporosis without current pathological fracture: Secondary | ICD-10-CM | POA: Diagnosis not present

## 2019-01-01 DIAGNOSIS — I69852 Hemiplegia and hemiparesis following other cerebrovascular disease affecting left dominant side: Secondary | ICD-10-CM | POA: Diagnosis not present

## 2019-01-01 DIAGNOSIS — I509 Heart failure, unspecified: Secondary | ICD-10-CM | POA: Diagnosis not present

## 2019-01-01 DIAGNOSIS — M6281 Muscle weakness (generalized): Secondary | ICD-10-CM | POA: Diagnosis not present

## 2019-01-01 DIAGNOSIS — R262 Difficulty in walking, not elsewhere classified: Secondary | ICD-10-CM | POA: Diagnosis not present

## 2019-01-01 DIAGNOSIS — M24541 Contracture, right hand: Secondary | ICD-10-CM | POA: Diagnosis not present

## 2019-01-03 DIAGNOSIS — M6281 Muscle weakness (generalized): Secondary | ICD-10-CM | POA: Diagnosis not present

## 2019-01-03 DIAGNOSIS — M810 Age-related osteoporosis without current pathological fracture: Secondary | ICD-10-CM | POA: Diagnosis not present

## 2019-01-03 DIAGNOSIS — I69852 Hemiplegia and hemiparesis following other cerebrovascular disease affecting left dominant side: Secondary | ICD-10-CM | POA: Diagnosis not present

## 2019-01-03 DIAGNOSIS — I509 Heart failure, unspecified: Secondary | ICD-10-CM | POA: Diagnosis not present

## 2019-01-03 DIAGNOSIS — R262 Difficulty in walking, not elsewhere classified: Secondary | ICD-10-CM | POA: Diagnosis not present

## 2019-01-03 DIAGNOSIS — M24541 Contracture, right hand: Secondary | ICD-10-CM | POA: Diagnosis not present

## 2019-01-04 DIAGNOSIS — M6281 Muscle weakness (generalized): Secondary | ICD-10-CM | POA: Diagnosis not present

## 2019-01-04 DIAGNOSIS — M24541 Contracture, right hand: Secondary | ICD-10-CM | POA: Diagnosis not present

## 2019-01-04 DIAGNOSIS — I509 Heart failure, unspecified: Secondary | ICD-10-CM | POA: Diagnosis not present

## 2019-01-04 DIAGNOSIS — M810 Age-related osteoporosis without current pathological fracture: Secondary | ICD-10-CM | POA: Diagnosis not present

## 2019-01-04 DIAGNOSIS — R262 Difficulty in walking, not elsewhere classified: Secondary | ICD-10-CM | POA: Diagnosis not present

## 2019-01-04 DIAGNOSIS — I69852 Hemiplegia and hemiparesis following other cerebrovascular disease affecting left dominant side: Secondary | ICD-10-CM | POA: Diagnosis not present

## 2019-01-05 DIAGNOSIS — M24541 Contracture, right hand: Secondary | ICD-10-CM | POA: Diagnosis not present

## 2019-01-05 DIAGNOSIS — F331 Major depressive disorder, recurrent, moderate: Secondary | ICD-10-CM | POA: Diagnosis not present

## 2019-01-05 DIAGNOSIS — I69852 Hemiplegia and hemiparesis following other cerebrovascular disease affecting left dominant side: Secondary | ICD-10-CM | POA: Diagnosis not present

## 2019-01-05 DIAGNOSIS — G4700 Insomnia, unspecified: Secondary | ICD-10-CM | POA: Diagnosis not present

## 2019-01-05 DIAGNOSIS — F4320 Adjustment disorder, unspecified: Secondary | ICD-10-CM | POA: Diagnosis not present

## 2019-01-05 DIAGNOSIS — R262 Difficulty in walking, not elsewhere classified: Secondary | ICD-10-CM | POA: Diagnosis not present

## 2019-01-05 DIAGNOSIS — I509 Heart failure, unspecified: Secondary | ICD-10-CM | POA: Diagnosis not present

## 2019-01-05 DIAGNOSIS — M810 Age-related osteoporosis without current pathological fracture: Secondary | ICD-10-CM | POA: Diagnosis not present

## 2019-01-05 DIAGNOSIS — M6281 Muscle weakness (generalized): Secondary | ICD-10-CM | POA: Diagnosis not present

## 2019-01-06 DIAGNOSIS — M810 Age-related osteoporosis without current pathological fracture: Secondary | ICD-10-CM | POA: Diagnosis not present

## 2019-01-06 DIAGNOSIS — M24541 Contracture, right hand: Secondary | ICD-10-CM | POA: Diagnosis not present

## 2019-01-06 DIAGNOSIS — R262 Difficulty in walking, not elsewhere classified: Secondary | ICD-10-CM | POA: Diagnosis not present

## 2019-01-06 DIAGNOSIS — I509 Heart failure, unspecified: Secondary | ICD-10-CM | POA: Diagnosis not present

## 2019-01-06 DIAGNOSIS — M6281 Muscle weakness (generalized): Secondary | ICD-10-CM | POA: Diagnosis not present

## 2019-01-06 DIAGNOSIS — I69852 Hemiplegia and hemiparesis following other cerebrovascular disease affecting left dominant side: Secondary | ICD-10-CM | POA: Diagnosis not present

## 2019-01-07 DIAGNOSIS — R262 Difficulty in walking, not elsewhere classified: Secondary | ICD-10-CM | POA: Diagnosis not present

## 2019-01-07 DIAGNOSIS — M810 Age-related osteoporosis without current pathological fracture: Secondary | ICD-10-CM | POA: Diagnosis not present

## 2019-01-07 DIAGNOSIS — I509 Heart failure, unspecified: Secondary | ICD-10-CM | POA: Diagnosis not present

## 2019-01-07 DIAGNOSIS — M6281 Muscle weakness (generalized): Secondary | ICD-10-CM | POA: Diagnosis not present

## 2019-01-07 DIAGNOSIS — I69852 Hemiplegia and hemiparesis following other cerebrovascular disease affecting left dominant side: Secondary | ICD-10-CM | POA: Diagnosis not present

## 2019-01-07 DIAGNOSIS — M24541 Contracture, right hand: Secondary | ICD-10-CM | POA: Diagnosis not present

## 2019-01-10 DIAGNOSIS — R262 Difficulty in walking, not elsewhere classified: Secondary | ICD-10-CM | POA: Diagnosis not present

## 2019-01-10 DIAGNOSIS — M6281 Muscle weakness (generalized): Secondary | ICD-10-CM | POA: Diagnosis not present

## 2019-01-10 DIAGNOSIS — I509 Heart failure, unspecified: Secondary | ICD-10-CM | POA: Diagnosis not present

## 2019-01-11 DIAGNOSIS — R262 Difficulty in walking, not elsewhere classified: Secondary | ICD-10-CM | POA: Diagnosis not present

## 2019-01-11 DIAGNOSIS — I509 Heart failure, unspecified: Secondary | ICD-10-CM | POA: Diagnosis not present

## 2019-01-11 DIAGNOSIS — M6281 Muscle weakness (generalized): Secondary | ICD-10-CM | POA: Diagnosis not present

## 2019-01-12 DIAGNOSIS — I509 Heart failure, unspecified: Secondary | ICD-10-CM | POA: Diagnosis not present

## 2019-01-12 DIAGNOSIS — M6281 Muscle weakness (generalized): Secondary | ICD-10-CM | POA: Diagnosis not present

## 2019-01-12 DIAGNOSIS — R262 Difficulty in walking, not elsewhere classified: Secondary | ICD-10-CM | POA: Diagnosis not present

## 2019-01-13 DIAGNOSIS — I509 Heart failure, unspecified: Secondary | ICD-10-CM | POA: Diagnosis not present

## 2019-01-13 DIAGNOSIS — M6281 Muscle weakness (generalized): Secondary | ICD-10-CM | POA: Diagnosis not present

## 2019-01-13 DIAGNOSIS — R262 Difficulty in walking, not elsewhere classified: Secondary | ICD-10-CM | POA: Diagnosis not present

## 2019-01-15 DIAGNOSIS — R262 Difficulty in walking, not elsewhere classified: Secondary | ICD-10-CM | POA: Diagnosis not present

## 2019-01-15 DIAGNOSIS — I509 Heart failure, unspecified: Secondary | ICD-10-CM | POA: Diagnosis not present

## 2019-01-15 DIAGNOSIS — M6281 Muscle weakness (generalized): Secondary | ICD-10-CM | POA: Diagnosis not present

## 2019-01-18 DIAGNOSIS — M6281 Muscle weakness (generalized): Secondary | ICD-10-CM | POA: Diagnosis not present

## 2019-01-18 DIAGNOSIS — R262 Difficulty in walking, not elsewhere classified: Secondary | ICD-10-CM | POA: Diagnosis not present

## 2019-01-18 DIAGNOSIS — I509 Heart failure, unspecified: Secondary | ICD-10-CM | POA: Diagnosis not present

## 2019-01-19 DIAGNOSIS — I509 Heart failure, unspecified: Secondary | ICD-10-CM | POA: Diagnosis not present

## 2019-01-19 DIAGNOSIS — M6281 Muscle weakness (generalized): Secondary | ICD-10-CM | POA: Diagnosis not present

## 2019-01-19 DIAGNOSIS — R262 Difficulty in walking, not elsewhere classified: Secondary | ICD-10-CM | POA: Diagnosis not present

## 2019-01-20 DIAGNOSIS — R262 Difficulty in walking, not elsewhere classified: Secondary | ICD-10-CM | POA: Diagnosis not present

## 2019-01-20 DIAGNOSIS — I509 Heart failure, unspecified: Secondary | ICD-10-CM | POA: Diagnosis not present

## 2019-01-20 DIAGNOSIS — M6281 Muscle weakness (generalized): Secondary | ICD-10-CM | POA: Diagnosis not present

## 2019-01-21 DIAGNOSIS — I509 Heart failure, unspecified: Secondary | ICD-10-CM | POA: Diagnosis not present

## 2019-01-21 DIAGNOSIS — R262 Difficulty in walking, not elsewhere classified: Secondary | ICD-10-CM | POA: Diagnosis not present

## 2019-01-21 DIAGNOSIS — M6281 Muscle weakness (generalized): Secondary | ICD-10-CM | POA: Diagnosis not present

## 2019-01-22 DIAGNOSIS — M6281 Muscle weakness (generalized): Secondary | ICD-10-CM | POA: Diagnosis not present

## 2019-01-22 DIAGNOSIS — R262 Difficulty in walking, not elsewhere classified: Secondary | ICD-10-CM | POA: Diagnosis not present

## 2019-01-22 DIAGNOSIS — I509 Heart failure, unspecified: Secondary | ICD-10-CM | POA: Diagnosis not present

## 2019-01-24 DIAGNOSIS — R262 Difficulty in walking, not elsewhere classified: Secondary | ICD-10-CM | POA: Diagnosis not present

## 2019-01-24 DIAGNOSIS — I509 Heart failure, unspecified: Secondary | ICD-10-CM | POA: Diagnosis not present

## 2019-01-24 DIAGNOSIS — M6281 Muscle weakness (generalized): Secondary | ICD-10-CM | POA: Diagnosis not present

## 2019-01-25 DIAGNOSIS — R262 Difficulty in walking, not elsewhere classified: Secondary | ICD-10-CM | POA: Diagnosis not present

## 2019-01-25 DIAGNOSIS — M6281 Muscle weakness (generalized): Secondary | ICD-10-CM | POA: Diagnosis not present

## 2019-01-25 DIAGNOSIS — I509 Heart failure, unspecified: Secondary | ICD-10-CM | POA: Diagnosis not present

## 2019-01-26 DIAGNOSIS — R262 Difficulty in walking, not elsewhere classified: Secondary | ICD-10-CM | POA: Diagnosis not present

## 2019-01-26 DIAGNOSIS — M6281 Muscle weakness (generalized): Secondary | ICD-10-CM | POA: Diagnosis not present

## 2019-01-26 DIAGNOSIS — I509 Heart failure, unspecified: Secondary | ICD-10-CM | POA: Diagnosis not present

## 2019-01-27 DIAGNOSIS — R262 Difficulty in walking, not elsewhere classified: Secondary | ICD-10-CM | POA: Diagnosis not present

## 2019-01-27 DIAGNOSIS — I4891 Unspecified atrial fibrillation: Secondary | ICD-10-CM | POA: Diagnosis not present

## 2019-01-27 DIAGNOSIS — K219 Gastro-esophageal reflux disease without esophagitis: Secondary | ICD-10-CM | POA: Diagnosis not present

## 2019-01-27 DIAGNOSIS — M6281 Muscle weakness (generalized): Secondary | ICD-10-CM | POA: Diagnosis not present

## 2019-01-27 DIAGNOSIS — I10 Essential (primary) hypertension: Secondary | ICD-10-CM | POA: Diagnosis not present

## 2019-01-27 DIAGNOSIS — I5030 Unspecified diastolic (congestive) heart failure: Secondary | ICD-10-CM | POA: Diagnosis not present

## 2019-01-27 DIAGNOSIS — I82409 Acute embolism and thrombosis of unspecified deep veins of unspecified lower extremity: Secondary | ICD-10-CM | POA: Diagnosis not present

## 2019-01-27 DIAGNOSIS — D6869 Other thrombophilia: Secondary | ICD-10-CM | POA: Diagnosis not present

## 2019-01-27 DIAGNOSIS — I509 Heart failure, unspecified: Secondary | ICD-10-CM | POA: Diagnosis not present

## 2019-01-27 DIAGNOSIS — G40909 Epilepsy, unspecified, not intractable, without status epilepticus: Secondary | ICD-10-CM | POA: Diagnosis not present

## 2019-01-27 DIAGNOSIS — I69359 Hemiplegia and hemiparesis following cerebral infarction affecting unspecified side: Secondary | ICD-10-CM | POA: Diagnosis not present

## 2019-01-29 DIAGNOSIS — I509 Heart failure, unspecified: Secondary | ICD-10-CM | POA: Diagnosis not present

## 2019-01-29 DIAGNOSIS — R262 Difficulty in walking, not elsewhere classified: Secondary | ICD-10-CM | POA: Diagnosis not present

## 2019-01-29 DIAGNOSIS — M6281 Muscle weakness (generalized): Secondary | ICD-10-CM | POA: Diagnosis not present

## 2019-02-03 DIAGNOSIS — M3501 Sicca syndrome with keratoconjunctivitis: Secondary | ICD-10-CM | POA: Diagnosis not present

## 2019-02-03 DIAGNOSIS — H04123 Dry eye syndrome of bilateral lacrimal glands: Secondary | ICD-10-CM | POA: Diagnosis not present

## 2019-02-03 DIAGNOSIS — H2513 Age-related nuclear cataract, bilateral: Secondary | ICD-10-CM | POA: Diagnosis not present

## 2019-02-24 DIAGNOSIS — I4891 Unspecified atrial fibrillation: Secondary | ICD-10-CM | POA: Diagnosis not present

## 2019-02-24 DIAGNOSIS — F322 Major depressive disorder, single episode, severe without psychotic features: Secondary | ICD-10-CM | POA: Diagnosis not present

## 2019-02-24 DIAGNOSIS — L299 Pruritus, unspecified: Secondary | ICD-10-CM | POA: Diagnosis not present

## 2019-02-24 DIAGNOSIS — D6869 Other thrombophilia: Secondary | ICD-10-CM | POA: Diagnosis not present

## 2019-02-24 DIAGNOSIS — K219 Gastro-esophageal reflux disease without esophagitis: Secondary | ICD-10-CM | POA: Diagnosis not present

## 2019-02-24 DIAGNOSIS — I69359 Hemiplegia and hemiparesis following cerebral infarction affecting unspecified side: Secondary | ICD-10-CM | POA: Diagnosis not present

## 2019-02-24 DIAGNOSIS — I82409 Acute embolism and thrombosis of unspecified deep veins of unspecified lower extremity: Secondary | ICD-10-CM | POA: Diagnosis not present

## 2019-02-24 DIAGNOSIS — I5030 Unspecified diastolic (congestive) heart failure: Secondary | ICD-10-CM | POA: Diagnosis not present

## 2019-02-24 DIAGNOSIS — G40909 Epilepsy, unspecified, not intractable, without status epilepticus: Secondary | ICD-10-CM | POA: Diagnosis not present

## 2019-02-24 DIAGNOSIS — I10 Essential (primary) hypertension: Secondary | ICD-10-CM | POA: Diagnosis not present

## 2019-03-03 DIAGNOSIS — F411 Generalized anxiety disorder: Secondary | ICD-10-CM | POA: Diagnosis not present

## 2019-03-03 DIAGNOSIS — R41841 Cognitive communication deficit: Secondary | ICD-10-CM | POA: Diagnosis not present

## 2019-03-03 DIAGNOSIS — K219 Gastro-esophageal reflux disease without esophagitis: Secondary | ICD-10-CM | POA: Diagnosis not present

## 2019-03-03 DIAGNOSIS — I509 Heart failure, unspecified: Secondary | ICD-10-CM | POA: Diagnosis not present

## 2019-03-03 DIAGNOSIS — I69852 Hemiplegia and hemiparesis following other cerebrovascular disease affecting left dominant side: Secondary | ICD-10-CM | POA: Diagnosis not present

## 2019-03-15 DIAGNOSIS — Z23 Encounter for immunization: Secondary | ICD-10-CM | POA: Diagnosis not present

## 2019-03-23 DIAGNOSIS — Z1383 Encounter for screening for respiratory disorder NEC: Secondary | ICD-10-CM | POA: Diagnosis not present

## 2019-03-23 DIAGNOSIS — Z20828 Contact with and (suspected) exposure to other viral communicable diseases: Secondary | ICD-10-CM | POA: Diagnosis not present

## 2019-03-24 DIAGNOSIS — I69359 Hemiplegia and hemiparesis following cerebral infarction affecting unspecified side: Secondary | ICD-10-CM | POA: Diagnosis not present

## 2019-03-24 DIAGNOSIS — I10 Essential (primary) hypertension: Secondary | ICD-10-CM | POA: Diagnosis not present

## 2019-03-24 DIAGNOSIS — I5030 Unspecified diastolic (congestive) heart failure: Secondary | ICD-10-CM | POA: Diagnosis not present

## 2019-04-13 DIAGNOSIS — G4700 Insomnia, unspecified: Secondary | ICD-10-CM | POA: Diagnosis not present

## 2019-04-13 DIAGNOSIS — F331 Major depressive disorder, recurrent, moderate: Secondary | ICD-10-CM | POA: Diagnosis not present

## 2019-04-21 DIAGNOSIS — I69359 Hemiplegia and hemiparesis following cerebral infarction affecting unspecified side: Secondary | ICD-10-CM | POA: Diagnosis not present

## 2019-04-21 DIAGNOSIS — I10 Essential (primary) hypertension: Secondary | ICD-10-CM | POA: Diagnosis not present

## 2019-04-21 DIAGNOSIS — I5030 Unspecified diastolic (congestive) heart failure: Secondary | ICD-10-CM | POA: Diagnosis not present

## 2019-04-25 DIAGNOSIS — Z20828 Contact with and (suspected) exposure to other viral communicable diseases: Secondary | ICD-10-CM | POA: Diagnosis not present

## 2019-05-02 DIAGNOSIS — Z20828 Contact with and (suspected) exposure to other viral communicable diseases: Secondary | ICD-10-CM | POA: Diagnosis not present

## 2019-05-09 DIAGNOSIS — Z1383 Encounter for screening for respiratory disorder NEC: Secondary | ICD-10-CM | POA: Diagnosis not present

## 2019-05-09 DIAGNOSIS — Z20828 Contact with and (suspected) exposure to other viral communicable diseases: Secondary | ICD-10-CM | POA: Diagnosis not present

## 2019-05-10 DIAGNOSIS — I509 Heart failure, unspecified: Secondary | ICD-10-CM | POA: Diagnosis not present

## 2019-05-10 DIAGNOSIS — R2681 Unsteadiness on feet: Secondary | ICD-10-CM | POA: Diagnosis not present

## 2019-05-10 DIAGNOSIS — M6281 Muscle weakness (generalized): Secondary | ICD-10-CM | POA: Diagnosis not present

## 2019-05-11 DIAGNOSIS — M6281 Muscle weakness (generalized): Secondary | ICD-10-CM | POA: Diagnosis not present

## 2019-05-11 DIAGNOSIS — I509 Heart failure, unspecified: Secondary | ICD-10-CM | POA: Diagnosis not present

## 2019-05-11 DIAGNOSIS — R2681 Unsteadiness on feet: Secondary | ICD-10-CM | POA: Diagnosis not present

## 2019-05-12 DIAGNOSIS — M6281 Muscle weakness (generalized): Secondary | ICD-10-CM | POA: Diagnosis not present

## 2019-05-12 DIAGNOSIS — I509 Heart failure, unspecified: Secondary | ICD-10-CM | POA: Diagnosis not present

## 2019-05-12 DIAGNOSIS — R2681 Unsteadiness on feet: Secondary | ICD-10-CM | POA: Diagnosis not present

## 2019-05-13 DIAGNOSIS — I509 Heart failure, unspecified: Secondary | ICD-10-CM | POA: Diagnosis not present

## 2019-05-13 DIAGNOSIS — M6281 Muscle weakness (generalized): Secondary | ICD-10-CM | POA: Diagnosis not present

## 2019-05-13 DIAGNOSIS — R2681 Unsteadiness on feet: Secondary | ICD-10-CM | POA: Diagnosis not present

## 2019-05-15 DIAGNOSIS — Z20828 Contact with and (suspected) exposure to other viral communicable diseases: Secondary | ICD-10-CM | POA: Diagnosis not present

## 2019-05-15 DIAGNOSIS — Z1383 Encounter for screening for respiratory disorder NEC: Secondary | ICD-10-CM | POA: Diagnosis not present

## 2019-05-16 DIAGNOSIS — R2681 Unsteadiness on feet: Secondary | ICD-10-CM | POA: Diagnosis not present

## 2019-05-16 DIAGNOSIS — I509 Heart failure, unspecified: Secondary | ICD-10-CM | POA: Diagnosis not present

## 2019-05-16 DIAGNOSIS — M6281 Muscle weakness (generalized): Secondary | ICD-10-CM | POA: Diagnosis not present

## 2019-05-17 DIAGNOSIS — I509 Heart failure, unspecified: Secondary | ICD-10-CM | POA: Diagnosis not present

## 2019-05-17 DIAGNOSIS — M6281 Muscle weakness (generalized): Secondary | ICD-10-CM | POA: Diagnosis not present

## 2019-05-17 DIAGNOSIS — R2681 Unsteadiness on feet: Secondary | ICD-10-CM | POA: Diagnosis not present

## 2019-05-18 DIAGNOSIS — I509 Heart failure, unspecified: Secondary | ICD-10-CM | POA: Diagnosis not present

## 2019-05-18 DIAGNOSIS — M6281 Muscle weakness (generalized): Secondary | ICD-10-CM | POA: Diagnosis not present

## 2019-05-18 DIAGNOSIS — R2681 Unsteadiness on feet: Secondary | ICD-10-CM | POA: Diagnosis not present

## 2019-05-19 DIAGNOSIS — R2681 Unsteadiness on feet: Secondary | ICD-10-CM | POA: Diagnosis not present

## 2019-05-19 DIAGNOSIS — I10 Essential (primary) hypertension: Secondary | ICD-10-CM | POA: Diagnosis not present

## 2019-05-19 DIAGNOSIS — I69359 Hemiplegia and hemiparesis following cerebral infarction affecting unspecified side: Secondary | ICD-10-CM | POA: Diagnosis not present

## 2019-05-19 DIAGNOSIS — M6281 Muscle weakness (generalized): Secondary | ICD-10-CM | POA: Diagnosis not present

## 2019-05-19 DIAGNOSIS — I5030 Unspecified diastolic (congestive) heart failure: Secondary | ICD-10-CM | POA: Diagnosis not present

## 2019-05-19 DIAGNOSIS — I509 Heart failure, unspecified: Secondary | ICD-10-CM | POA: Diagnosis not present

## 2019-05-20 DIAGNOSIS — I509 Heart failure, unspecified: Secondary | ICD-10-CM | POA: Diagnosis not present

## 2019-05-20 DIAGNOSIS — M6281 Muscle weakness (generalized): Secondary | ICD-10-CM | POA: Diagnosis not present

## 2019-05-20 DIAGNOSIS — R2681 Unsteadiness on feet: Secondary | ICD-10-CM | POA: Diagnosis not present

## 2019-05-23 DIAGNOSIS — R2681 Unsteadiness on feet: Secondary | ICD-10-CM | POA: Diagnosis not present

## 2019-05-23 DIAGNOSIS — I509 Heart failure, unspecified: Secondary | ICD-10-CM | POA: Diagnosis not present

## 2019-05-23 DIAGNOSIS — M6281 Muscle weakness (generalized): Secondary | ICD-10-CM | POA: Diagnosis not present

## 2019-05-24 DIAGNOSIS — M6281 Muscle weakness (generalized): Secondary | ICD-10-CM | POA: Diagnosis not present

## 2019-05-24 DIAGNOSIS — I509 Heart failure, unspecified: Secondary | ICD-10-CM | POA: Diagnosis not present

## 2019-05-24 DIAGNOSIS — R2681 Unsteadiness on feet: Secondary | ICD-10-CM | POA: Diagnosis not present

## 2019-05-25 DIAGNOSIS — I509 Heart failure, unspecified: Secondary | ICD-10-CM | POA: Diagnosis not present

## 2019-05-25 DIAGNOSIS — M6281 Muscle weakness (generalized): Secondary | ICD-10-CM | POA: Diagnosis not present

## 2019-05-25 DIAGNOSIS — R2681 Unsteadiness on feet: Secondary | ICD-10-CM | POA: Diagnosis not present

## 2019-05-26 DIAGNOSIS — M6281 Muscle weakness (generalized): Secondary | ICD-10-CM | POA: Diagnosis not present

## 2019-05-26 DIAGNOSIS — R2681 Unsteadiness on feet: Secondary | ICD-10-CM | POA: Diagnosis not present

## 2019-05-26 DIAGNOSIS — I509 Heart failure, unspecified: Secondary | ICD-10-CM | POA: Diagnosis not present

## 2019-05-27 DIAGNOSIS — M6281 Muscle weakness (generalized): Secondary | ICD-10-CM | POA: Diagnosis not present

## 2019-05-27 DIAGNOSIS — R2681 Unsteadiness on feet: Secondary | ICD-10-CM | POA: Diagnosis not present

## 2019-05-27 DIAGNOSIS — I509 Heart failure, unspecified: Secondary | ICD-10-CM | POA: Diagnosis not present

## 2019-05-30 DIAGNOSIS — M6281 Muscle weakness (generalized): Secondary | ICD-10-CM | POA: Diagnosis not present

## 2019-05-30 DIAGNOSIS — I509 Heart failure, unspecified: Secondary | ICD-10-CM | POA: Diagnosis not present

## 2019-05-30 DIAGNOSIS — R2681 Unsteadiness on feet: Secondary | ICD-10-CM | POA: Diagnosis not present

## 2019-05-31 DIAGNOSIS — I509 Heart failure, unspecified: Secondary | ICD-10-CM | POA: Diagnosis not present

## 2019-05-31 DIAGNOSIS — R2681 Unsteadiness on feet: Secondary | ICD-10-CM | POA: Diagnosis not present

## 2019-05-31 DIAGNOSIS — M6281 Muscle weakness (generalized): Secondary | ICD-10-CM | POA: Diagnosis not present

## 2019-06-01 DIAGNOSIS — R2681 Unsteadiness on feet: Secondary | ICD-10-CM | POA: Diagnosis not present

## 2019-06-01 DIAGNOSIS — I509 Heart failure, unspecified: Secondary | ICD-10-CM | POA: Diagnosis not present

## 2019-06-01 DIAGNOSIS — M6281 Muscle weakness (generalized): Secondary | ICD-10-CM | POA: Diagnosis not present

## 2019-06-02 DIAGNOSIS — I509 Heart failure, unspecified: Secondary | ICD-10-CM | POA: Diagnosis not present

## 2019-06-02 DIAGNOSIS — M6281 Muscle weakness (generalized): Secondary | ICD-10-CM | POA: Diagnosis not present

## 2019-06-02 DIAGNOSIS — R2681 Unsteadiness on feet: Secondary | ICD-10-CM | POA: Diagnosis not present

## 2019-06-03 DIAGNOSIS — M6281 Muscle weakness (generalized): Secondary | ICD-10-CM | POA: Diagnosis not present

## 2019-06-03 DIAGNOSIS — R2681 Unsteadiness on feet: Secondary | ICD-10-CM | POA: Diagnosis not present

## 2019-06-03 DIAGNOSIS — I509 Heart failure, unspecified: Secondary | ICD-10-CM | POA: Diagnosis not present

## 2019-06-06 DIAGNOSIS — R2681 Unsteadiness on feet: Secondary | ICD-10-CM | POA: Diagnosis not present

## 2019-06-06 DIAGNOSIS — M6281 Muscle weakness (generalized): Secondary | ICD-10-CM | POA: Diagnosis not present

## 2019-06-06 DIAGNOSIS — I509 Heart failure, unspecified: Secondary | ICD-10-CM | POA: Diagnosis not present

## 2019-06-07 DIAGNOSIS — R2681 Unsteadiness on feet: Secondary | ICD-10-CM | POA: Diagnosis not present

## 2019-06-07 DIAGNOSIS — M6281 Muscle weakness (generalized): Secondary | ICD-10-CM | POA: Diagnosis not present

## 2019-06-07 DIAGNOSIS — I509 Heart failure, unspecified: Secondary | ICD-10-CM | POA: Diagnosis not present

## 2019-06-08 DIAGNOSIS — I509 Heart failure, unspecified: Secondary | ICD-10-CM | POA: Diagnosis not present

## 2019-06-08 DIAGNOSIS — R2681 Unsteadiness on feet: Secondary | ICD-10-CM | POA: Diagnosis not present

## 2019-06-08 DIAGNOSIS — M6281 Muscle weakness (generalized): Secondary | ICD-10-CM | POA: Diagnosis not present

## 2019-06-09 DIAGNOSIS — I509 Heart failure, unspecified: Secondary | ICD-10-CM | POA: Diagnosis not present

## 2019-06-09 DIAGNOSIS — R2681 Unsteadiness on feet: Secondary | ICD-10-CM | POA: Diagnosis not present

## 2019-06-09 DIAGNOSIS — M6281 Muscle weakness (generalized): Secondary | ICD-10-CM | POA: Diagnosis not present

## 2019-06-10 DIAGNOSIS — R2681 Unsteadiness on feet: Secondary | ICD-10-CM | POA: Diagnosis not present

## 2019-06-10 DIAGNOSIS — I509 Heart failure, unspecified: Secondary | ICD-10-CM | POA: Diagnosis not present

## 2019-06-10 DIAGNOSIS — M6281 Muscle weakness (generalized): Secondary | ICD-10-CM | POA: Diagnosis not present

## 2019-06-13 DIAGNOSIS — M6281 Muscle weakness (generalized): Secondary | ICD-10-CM | POA: Diagnosis not present

## 2019-06-13 DIAGNOSIS — Z79899 Other long term (current) drug therapy: Secondary | ICD-10-CM | POA: Diagnosis not present

## 2019-06-13 DIAGNOSIS — I509 Heart failure, unspecified: Secondary | ICD-10-CM | POA: Diagnosis not present

## 2019-06-13 DIAGNOSIS — R2681 Unsteadiness on feet: Secondary | ICD-10-CM | POA: Diagnosis not present

## 2019-06-14 DIAGNOSIS — I509 Heart failure, unspecified: Secondary | ICD-10-CM | POA: Diagnosis not present

## 2019-06-14 DIAGNOSIS — M6281 Muscle weakness (generalized): Secondary | ICD-10-CM | POA: Diagnosis not present

## 2019-06-14 DIAGNOSIS — R2681 Unsteadiness on feet: Secondary | ICD-10-CM | POA: Diagnosis not present

## 2019-06-15 DIAGNOSIS — I509 Heart failure, unspecified: Secondary | ICD-10-CM | POA: Diagnosis not present

## 2019-06-15 DIAGNOSIS — R2681 Unsteadiness on feet: Secondary | ICD-10-CM | POA: Diagnosis not present

## 2019-06-15 DIAGNOSIS — M6281 Muscle weakness (generalized): Secondary | ICD-10-CM | POA: Diagnosis not present

## 2019-06-28 DIAGNOSIS — Z7901 Long term (current) use of anticoagulants: Secondary | ICD-10-CM | POA: Diagnosis not present

## 2019-06-28 DIAGNOSIS — Z79899 Other long term (current) drug therapy: Secondary | ICD-10-CM | POA: Diagnosis not present

## 2019-06-28 DIAGNOSIS — U071 COVID-19: Secondary | ICD-10-CM | POA: Diagnosis not present

## 2019-07-03 DIAGNOSIS — Z20828 Contact with and (suspected) exposure to other viral communicable diseases: Secondary | ICD-10-CM | POA: Diagnosis not present

## 2019-07-03 DIAGNOSIS — Z1383 Encounter for screening for respiratory disorder NEC: Secondary | ICD-10-CM | POA: Diagnosis not present

## 2019-07-04 DIAGNOSIS — F331 Major depressive disorder, recurrent, moderate: Secondary | ICD-10-CM | POA: Diagnosis not present

## 2019-07-04 DIAGNOSIS — I69952 Hemiplegia and hemiparesis following unspecified cerebrovascular disease affecting left dominant side: Secondary | ICD-10-CM | POA: Diagnosis not present

## 2019-07-04 DIAGNOSIS — F419 Anxiety disorder, unspecified: Secondary | ICD-10-CM | POA: Diagnosis not present

## 2019-07-15 ENCOUNTER — Other Ambulatory Visit: Payer: Self-pay

## 2019-07-15 ENCOUNTER — Ambulatory Visit (INDEPENDENT_AMBULATORY_CARE_PROVIDER_SITE_OTHER): Payer: Medicare Other | Admitting: Cardiology

## 2019-07-15 ENCOUNTER — Encounter: Payer: Self-pay | Admitting: Cardiology

## 2019-07-15 VITALS — BP 130/70 | HR 94 | Ht 73.0 in | Wt 294.0 lb

## 2019-07-15 DIAGNOSIS — I10 Essential (primary) hypertension: Secondary | ICD-10-CM | POA: Diagnosis not present

## 2019-07-15 DIAGNOSIS — I4821 Permanent atrial fibrillation: Secondary | ICD-10-CM | POA: Diagnosis not present

## 2019-07-15 DIAGNOSIS — I5033 Acute on chronic diastolic (congestive) heart failure: Secondary | ICD-10-CM

## 2019-07-15 DIAGNOSIS — U071 COVID-19: Secondary | ICD-10-CM

## 2019-07-15 MED ORDER — TORSEMIDE 20 MG PO TABS: 40.0000 mg | ORAL_TABLET | Freq: Every day | ORAL | 0 refills | Status: DC

## 2019-07-15 NOTE — Patient Instructions (Signed)
Medication Instructions:   STOP TAKING LASIX (FUROSEMIDE) NOW  START TAKING TORSEMIDE 40 MG BY MOUTH DAILY  *If you need a refill on your cardiac medications before your next appointment, please call your pharmacy*   Lab Work:  TODAY--CMET, CBC, TSH, PRO-BNP, AND LIPIDS  If you have labs (blood work) drawn today and your tests are completely normal, you will receive your results only by: Marland Kitchen MyChart Message (if you have MyChart) OR . A paper copy in the mail If you have any lab test that is abnormal or we need to change your treatment, we will call you to review the results.   Testing/Procedures:  Your physician has requested that you have an echocardiogram. Echocardiography is a painless test that uses sound waves to create images of your heart. It provides your doctor with information about the size and shape of your heart and how well your heart's chambers and valves are working. This procedure takes approximately one hour. There are no restrictions for this procedure.    Follow-Up:  3 MONTHS WITH DR. Delton See OR AN EXTENDER/APP IN THE OFFICE

## 2019-07-15 NOTE — Progress Notes (Addendum)
07/15/2019 James Villegas   10/23/1955  [id number]  Primary Physician [name], MD Primary Cardiologist: Dr. Delton See  Electrophysiologist: None   Reason for Visit/CC: 6 months f/u for chronic diastolic HF and atrial fibrillation  HPI:  James Villegas is a 64 y.o. 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 and + lupus anticoagulant, maintained on lovenox. Dr. Excell Seltzer evaluated him in 2013 for his PFO. No indication for closure. Medication w/ anticoagulation only was recommended. He was lost to f/u for a period of time and was recently admitted to [facility] for CHF and new onset flutter. He was seen by Dr. Delton See.  Echocardiogram showed normal LV systolic function, mildly dilated LA. He was treated w/ IV diuretics and diuresed well. He was transitioned to PO diuretics.  He was placed on rate control therapy for atrial flutter management. Anticoagulation was continued. He had post hospital f/u in our office in March and was doing well and instructed to f/u again in 3 months time.   07/15/2019 the patient states that he has been doing okay until early January when they had a spread of COVID-19 infection in his nursing home distributed by the staff, the patient states that he only had mild symptoms with no chest pain shortness of breath, he felt that his lower extremity edema got slightly worse.  He currently denies any shortness of breath, he has no orthopnea or proximal nocturnal dyspnea.  He has been compliant with his medications.  Cardiac Studies  2D Echo 08/04/18 IMPRESSIONS   1. The left ventricle has normal systolic function with an ejection fraction of 60-65%. The cavity size was normal. There is moderate asymmetric septal left ventricular hypertrophy. Left ventricular diastology could not be evaluated secondary  to atrial  fibrillation.  2. The right ventricle has normal systolic function. The cavity was normal. There is no increase in right ventricular wall thickness. Right ventricular systolic pressure could not be assessed.  3. The tricuspid valve is normal in structure.  4. The pulmonic valve was normal in structure.  5. There is moderate dilatation of the aortic root at 18mm and of the ascending aorta measuring 45 mm.  6. Left atrial size was mildly dilated.  7. The aortic valve is tricuspid Mild thickening of the aortic valve Aortic valve regurgitation is mild by color flow Doppler.  8. The mitral valve is normal in structure.  9. The inferior vena cava was dilated in size with >50% respiratory variability.   Current Meds  Medication Sig   acetaminophen (TYLENOL) 325 MG tablet Take 650 mg by mouth every 6 (six) hours as needed.   Carboxymeth-Glycerin-Polysorb (REFRESH OPTIVE ADVANCED OP) Place 1 drop into both eyes 4 (four) times daily. Wait 3-5 minutes between eye meds   ezetimibe (ZETIA) 10 MG tablet Take 10 mg by mouth daily.   fish oil-omega-3 fatty acids 1000 MG capsule Take 1 g by mouth 2 (two) times daily.    gabapentin (NEURONTIN) 300 MG capsule Take 300 mg by mouth 3 (three) times daily.     guaifenesin (ROBITUSSIN) 100 MG/5ML syrup Take 200 mg by mouth every 4 (four) hours as needed for cough.   Melatonin 3 MG TABS Take 3 mg by mouth at bedtime.   metoprolol tartrate (LOPRESSOR) 25 MG tablet Take 0.5 tablets (12.5 mg total) by  mouth 2 (two) times daily.   omeprazole (PRILOSEC) 20 MG capsule Take 20 mg by mouth at bedtime.    ondansetron (ZOFRAN) 4 MG tablet Take 4 mg by mouth every 8 (eight) hours.   phenytoin (DILANTIN) 100 MG ER capsule Take 200 mg by mouth 2 (two) times daily.   potassium chloride (K-DUR) 10 MEQ tablet Take 2 tablets (20 mEq total) by mouth daily.   RESTASIS 0.05 % ophthalmic emulsion Place 1 drop into both eyes 2 (two) times daily.   rosuvastatin  (CRESTOR) 40 MG tablet Take 40 mg by mouth at bedtime.    Skin Protectants, Misc. (EUCERIN) cream Apply 1 application topically daily. Apply to areas of dry skin once daily   spironolactone (ALDACTONE) 25 MG tablet Take 1 tablet (25 mg total) by mouth daily.   traZODone (DESYREL) 50 MG tablet Take 50 mg by mouth at bedtime.   venlafaxine (EFFEXOR) 75 MG tablet Take 75 mg by mouth daily.   warfarin (COUMADIN) 4 MG tablet Take 4 mg by mouth daily.   [DISCONTINUED] furosemide (LASIX) 40 MG tablet Take 1 tablet (40 mg total) by mouth 2 (two) times daily.   Allergies  Allergen Reactions   Fenofibrate Other (See Comments)    Per MAR   Past Medical History:  Diagnosis Date   Acute on chronic congestive heart failure (HCC)    Anemia, secondary    SECONDARY TO ACUTE BLOOD LOSS   Anxiety disorder    Bloody stool 08/29/2011   intermittent along with constipation.    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 IVC filter   Seizure disorder (HCC)    No family history on file. Past Surgical History:  Procedure Laterality Date   dental extraction     multiple   vena cavogram  12/2010   INFERIOR   Social History   Socioeconomic History   Marital status: Single    Spouse name: Not on file   Number of children: Not on file   Years  of education: Not on file   Highest education level: Not on file  Occupational History   Not on file  Tobacco Use   Smoking status: Former Smoker   Smokeless tobacco: Never Used  Substance and Sexual Activity   Alcohol use: No    Comment: 10 years Sober    Drug use: No   Sexual activity: Not on file  Other Topics Concern   Not on file  Social History Narrative   Not on file   Social Determinants of Health   Financial Resource Strain:    Difficulty of Paying Living Expenses: Not on file  Food Insecurity:    Worried About Running Out of Food in the Last Year: Not on file  Ran Out of Food in the Last Year: Not on file  Transportation Needs:    Lack of Transportation (Medical): Not on file   Lack of Transportation (Non-Medical): Not on file  Physical Activity:    Days of Exercise per Week: Not on file   Minutes of Exercise per Session: Not on file  Stress:    Feeling of Stress : Not on file  Social Connections:    Frequency of Communication with Friends and Family: Not on file   Frequency of Social Gatherings with Friends and Family: Not on file   Attends Religious Services: Not on file   Active Member of Clubs or Organizations: Not on file   Attends Archivist Meetings: Not on file   Marital Status: Not on file  Intimate Partner Violence:    Fear of Current or Ex-Partner: Not on file   Emotionally Abused: Not on file   Physically Abused: Not on file   Sexually Abused: Not on file     Lipid Panel     Component Value Date/Time   CHOL 194 08/05/2018 0401   TRIG 137 08/05/2018 0401   HDL 51 08/05/2018 0401   CHOLHDL 3.8 08/05/2018 0401   VLDL 27 08/05/2018 0401   LDLCALC 116 (H) 08/05/2018 0401    Review of Systems: General: negative for chills, fever, night sweats or weight changes.  Cardiovascular: negative for chest pain, dyspnea on exertion, edema, orthopnea, palpitations, paroxysmal nocturnal dyspnea or shortness of  breath Dermatological: negative for rash Respiratory: negative for cough or wheezing Urologic: negative for hematuria Abdominal: negative for nausea, vomiting, diarrhea, bright red blood per rectum, melena, or hematemesis Neurologic: negative for visual changes, syncope, or dizziness All other systems reviewed and are otherwise negative except as noted above.   Physical Exam:  Blood pressure 130/70, pulse 94, height 6\' 1"  (1.854 m), weight 294 lb (133.4 kg), SpO2 94 %.  General appearance: Hemiplegic, morbidly obese Neck: no carotid bruit and no JVD Lungs: clear to auscultation bilaterally Heart: irregularly irregular rhythm Extremities: extremities normal, atraumatic, no cyanosis, there is significant edema in his right lower extremity that is hemiplegic, edema is up to his knee.  He is unable to put his shoe on.  Pulses: 2+ and symmetric Skin: Skin color, texture, turgor normal. No rashes or lesions  EKG atrial fibrillation, 66 bpm, negative T waves in the inferolateral and anterior leads, right bundle branch block, this is unchanged from prior, personally reviewed.   ASSESSMENT AND PLAN:   1. Persistent Atrial fibrillation -rate controlled, no bleeding on warfarin, his most recent hemoglobin was 14.8.  We will continue the same dose of metoprolol.  2. Acute on Chronic Diastolic CHF: echo 10/537 showed normal LVEF and moderate LVH.  Given the fact that he just had COVID-19 infection and worsening right lower extremity edema we will repeat an echocardiogram, will switch Lasix to torsemide 40 mg daily, will also obtain labs today including CMP, CBC, TSH lipids and BNP.  3. HTN: Well-controlled  4.  Recent COVID-19 infection -S about  5. PFO: previously evaluated by Dr. Burt Knack. No plans for closure. Continue anticoagulation.   Follow-Up w/ Dr. Meda Coffee in 3 months.   Ena Dawley , MD [facility] 07/15/2019 1:17 PM

## 2019-07-16 LAB — CBC
Hematocrit: 40.4 % (ref 37.5–51.0)
Hemoglobin: 13.9 g/dL (ref 13.0–17.7)
MCH: 30.5 pg (ref 26.6–33.0)
MCHC: 34.4 g/dL (ref 31.5–35.7)
MCV: 89 fL (ref 79–97)
Platelets: 227 10*3/uL (ref 150–450)
RBC: 4.55 x10E6/uL (ref 4.14–5.80)
RDW: 13.1 % (ref 11.6–15.4)
WBC: 7.8 10*3/uL (ref 3.4–10.8)

## 2019-07-16 LAB — LIPID PANEL
Chol/HDL Ratio: 3.5 ratio (ref 0.0–5.0)
Cholesterol, Total: 207 mg/dL — ABNORMAL HIGH (ref 100–199)
HDL: 60 mg/dL (ref >39)
LDL Chol Calc (NIH): 122 mg/dL — ABNORMAL HIGH (ref 0–99)
Triglycerides: 143 mg/dL (ref 0–149)
VLDL Cholesterol Cal: 25 mg/dL (ref 5–40)

## 2019-07-16 LAB — COMPREHENSIVE METABOLIC PANEL
ALT: 19 IU/L (ref 0–44)
AST: 20 IU/L (ref 0–40)
Albumin/Globulin Ratio: 1.5 (ref 1.2–2.2)
Albumin: 3.6 g/dL — ABNORMAL LOW (ref 3.8–4.8)
Alkaline Phosphatase: 122 IU/L — ABNORMAL HIGH (ref 39–117)
BUN/Creatinine Ratio: 12 (ref 10–24)
BUN: 8 mg/dL (ref 8–27)
Bilirubin Total: <0.2 mg/dL (ref 0.0–1.2)
CO2: 27 mmol/L (ref 20–29)
Calcium: 8.5 mg/dL — ABNORMAL LOW (ref 8.6–10.2)
Chloride: 97 mmol/L (ref 96–106)
Creatinine, Ser: 0.65 mg/dL — ABNORMAL LOW (ref 0.76–1.27)
GFR calc Af Amer: 121 mL/min/{1.73_m2} (ref >59)
GFR calc non Af Amer: 105 mL/min/{1.73_m2} (ref >59)
Globulin, Total: 2.4 g/dL (ref 1.5–4.5)
Glucose: 122 mg/dL — ABNORMAL HIGH (ref 65–99)
Potassium: 4.1 mmol/L (ref 3.5–5.2)
Sodium: 140 mmol/L (ref 134–144)
Total Protein: 6 g/dL (ref 6.0–8.5)

## 2019-07-16 LAB — PRO B NATRIURETIC PEPTIDE: NT-Pro BNP: 229 pg/mL — ABNORMAL HIGH (ref 0–210)

## 2019-07-16 LAB — TSH: TSH: 2.11 u[IU]/mL (ref 0.450–4.500)

## 2019-07-17 DIAGNOSIS — Z20828 Contact with and (suspected) exposure to other viral communicable diseases: Secondary | ICD-10-CM | POA: Diagnosis not present

## 2019-07-17 DIAGNOSIS — Z1383 Encounter for screening for respiratory disorder NEC: Secondary | ICD-10-CM | POA: Diagnosis not present

## 2019-07-18 DIAGNOSIS — F419 Anxiety disorder, unspecified: Secondary | ICD-10-CM | POA: Diagnosis not present

## 2019-07-18 DIAGNOSIS — F331 Major depressive disorder, recurrent, moderate: Secondary | ICD-10-CM | POA: Diagnosis not present

## 2019-07-21 DIAGNOSIS — I5030 Unspecified diastolic (congestive) heart failure: Secondary | ICD-10-CM | POA: Diagnosis not present

## 2019-07-21 DIAGNOSIS — M6281 Muscle weakness (generalized): Secondary | ICD-10-CM | POA: Diagnosis not present

## 2019-07-21 DIAGNOSIS — Z993 Dependence on wheelchair: Secondary | ICD-10-CM | POA: Diagnosis not present

## 2019-07-21 DIAGNOSIS — I509 Heart failure, unspecified: Secondary | ICD-10-CM | POA: Diagnosis not present

## 2019-07-21 DIAGNOSIS — I69359 Hemiplegia and hemiparesis following cerebral infarction affecting unspecified side: Secondary | ICD-10-CM | POA: Diagnosis not present

## 2019-07-21 DIAGNOSIS — I10 Essential (primary) hypertension: Secondary | ICD-10-CM | POA: Diagnosis not present

## 2019-08-01 DIAGNOSIS — F419 Anxiety disorder, unspecified: Secondary | ICD-10-CM | POA: Diagnosis not present

## 2019-08-01 DIAGNOSIS — F331 Major depressive disorder, recurrent, moderate: Secondary | ICD-10-CM | POA: Diagnosis not present

## 2019-08-02 ENCOUNTER — Encounter (HOSPITAL_COMMUNITY): Payer: Self-pay

## 2019-08-02 ENCOUNTER — Other Ambulatory Visit: Payer: Self-pay

## 2019-08-02 ENCOUNTER — Ambulatory Visit (HOSPITAL_COMMUNITY): Payer: Medicare Other

## 2019-08-05 DIAGNOSIS — Z993 Dependence on wheelchair: Secondary | ICD-10-CM | POA: Diagnosis not present

## 2019-08-05 DIAGNOSIS — I509 Heart failure, unspecified: Secondary | ICD-10-CM | POA: Diagnosis not present

## 2019-08-05 DIAGNOSIS — M6281 Muscle weakness (generalized): Secondary | ICD-10-CM | POA: Diagnosis not present

## 2019-08-08 DIAGNOSIS — M6281 Muscle weakness (generalized): Secondary | ICD-10-CM | POA: Diagnosis not present

## 2019-08-08 DIAGNOSIS — I509 Heart failure, unspecified: Secondary | ICD-10-CM | POA: Diagnosis not present

## 2019-08-08 DIAGNOSIS — Z993 Dependence on wheelchair: Secondary | ICD-10-CM | POA: Diagnosis not present

## 2019-08-09 DIAGNOSIS — I509 Heart failure, unspecified: Secondary | ICD-10-CM | POA: Diagnosis not present

## 2019-08-09 DIAGNOSIS — M6281 Muscle weakness (generalized): Secondary | ICD-10-CM | POA: Diagnosis not present

## 2019-08-09 DIAGNOSIS — Z993 Dependence on wheelchair: Secondary | ICD-10-CM | POA: Diagnosis not present

## 2019-08-10 DIAGNOSIS — Z993 Dependence on wheelchair: Secondary | ICD-10-CM | POA: Diagnosis not present

## 2019-08-10 DIAGNOSIS — I509 Heart failure, unspecified: Secondary | ICD-10-CM | POA: Diagnosis not present

## 2019-08-10 DIAGNOSIS — M6281 Muscle weakness (generalized): Secondary | ICD-10-CM | POA: Diagnosis not present

## 2019-08-11 DIAGNOSIS — Z993 Dependence on wheelchair: Secondary | ICD-10-CM | POA: Diagnosis not present

## 2019-08-11 DIAGNOSIS — I509 Heart failure, unspecified: Secondary | ICD-10-CM | POA: Diagnosis not present

## 2019-08-11 DIAGNOSIS — M6281 Muscle weakness (generalized): Secondary | ICD-10-CM | POA: Diagnosis not present

## 2019-08-12 DIAGNOSIS — Z993 Dependence on wheelchair: Secondary | ICD-10-CM | POA: Diagnosis not present

## 2019-08-12 DIAGNOSIS — I509 Heart failure, unspecified: Secondary | ICD-10-CM | POA: Diagnosis not present

## 2019-08-12 DIAGNOSIS — M6281 Muscle weakness (generalized): Secondary | ICD-10-CM | POA: Diagnosis not present

## 2019-08-14 DIAGNOSIS — Z20828 Contact with and (suspected) exposure to other viral communicable diseases: Secondary | ICD-10-CM | POA: Diagnosis not present

## 2019-08-14 DIAGNOSIS — Z1383 Encounter for screening for respiratory disorder NEC: Secondary | ICD-10-CM | POA: Diagnosis not present

## 2019-08-15 DIAGNOSIS — I509 Heart failure, unspecified: Secondary | ICD-10-CM | POA: Diagnosis not present

## 2019-08-15 DIAGNOSIS — M6281 Muscle weakness (generalized): Secondary | ICD-10-CM | POA: Diagnosis not present

## 2019-08-15 DIAGNOSIS — F419 Anxiety disorder, unspecified: Secondary | ICD-10-CM | POA: Diagnosis not present

## 2019-08-15 DIAGNOSIS — Z993 Dependence on wheelchair: Secondary | ICD-10-CM | POA: Diagnosis not present

## 2019-08-15 DIAGNOSIS — F331 Major depressive disorder, recurrent, moderate: Secondary | ICD-10-CM | POA: Diagnosis not present

## 2019-08-16 DIAGNOSIS — Z993 Dependence on wheelchair: Secondary | ICD-10-CM | POA: Diagnosis not present

## 2019-08-16 DIAGNOSIS — I509 Heart failure, unspecified: Secondary | ICD-10-CM | POA: Diagnosis not present

## 2019-08-16 DIAGNOSIS — M6281 Muscle weakness (generalized): Secondary | ICD-10-CM | POA: Diagnosis not present

## 2019-08-17 DIAGNOSIS — I509 Heart failure, unspecified: Secondary | ICD-10-CM | POA: Diagnosis not present

## 2019-08-17 DIAGNOSIS — Z993 Dependence on wheelchair: Secondary | ICD-10-CM | POA: Diagnosis not present

## 2019-08-17 DIAGNOSIS — M6281 Muscle weakness (generalized): Secondary | ICD-10-CM | POA: Diagnosis not present

## 2019-08-18 DIAGNOSIS — Z993 Dependence on wheelchair: Secondary | ICD-10-CM | POA: Diagnosis not present

## 2019-08-18 DIAGNOSIS — I5030 Unspecified diastolic (congestive) heart failure: Secondary | ICD-10-CM | POA: Diagnosis not present

## 2019-08-18 DIAGNOSIS — I10 Essential (primary) hypertension: Secondary | ICD-10-CM | POA: Diagnosis not present

## 2019-08-18 DIAGNOSIS — M6281 Muscle weakness (generalized): Secondary | ICD-10-CM | POA: Diagnosis not present

## 2019-08-18 DIAGNOSIS — I509 Heart failure, unspecified: Secondary | ICD-10-CM | POA: Diagnosis not present

## 2019-08-18 DIAGNOSIS — I69359 Hemiplegia and hemiparesis following cerebral infarction affecting unspecified side: Secondary | ICD-10-CM | POA: Diagnosis not present

## 2019-08-19 DIAGNOSIS — M6281 Muscle weakness (generalized): Secondary | ICD-10-CM | POA: Diagnosis not present

## 2019-08-19 DIAGNOSIS — I509 Heart failure, unspecified: Secondary | ICD-10-CM | POA: Diagnosis not present

## 2019-08-19 DIAGNOSIS — Z993 Dependence on wheelchair: Secondary | ICD-10-CM | POA: Diagnosis not present

## 2019-08-22 DIAGNOSIS — M79674 Pain in right toe(s): Secondary | ICD-10-CM | POA: Diagnosis not present

## 2019-08-22 DIAGNOSIS — I739 Peripheral vascular disease, unspecified: Secondary | ICD-10-CM | POA: Diagnosis not present

## 2019-08-22 DIAGNOSIS — M6281 Muscle weakness (generalized): Secondary | ICD-10-CM | POA: Diagnosis not present

## 2019-08-22 DIAGNOSIS — M79675 Pain in left toe(s): Secondary | ICD-10-CM | POA: Diagnosis not present

## 2019-08-22 DIAGNOSIS — I509 Heart failure, unspecified: Secondary | ICD-10-CM | POA: Diagnosis not present

## 2019-08-22 DIAGNOSIS — Z993 Dependence on wheelchair: Secondary | ICD-10-CM | POA: Diagnosis not present

## 2019-08-22 DIAGNOSIS — B351 Tinea unguium: Secondary | ICD-10-CM | POA: Diagnosis not present

## 2019-08-23 ENCOUNTER — Ambulatory Visit (HOSPITAL_COMMUNITY): Admission: RE | Admit: 2019-08-23 | Payer: Medicare Other | Source: Ambulatory Visit

## 2019-08-23 DIAGNOSIS — M6281 Muscle weakness (generalized): Secondary | ICD-10-CM | POA: Diagnosis not present

## 2019-08-23 DIAGNOSIS — I509 Heart failure, unspecified: Secondary | ICD-10-CM | POA: Diagnosis not present

## 2019-08-23 DIAGNOSIS — Z993 Dependence on wheelchair: Secondary | ICD-10-CM | POA: Diagnosis not present

## 2019-08-24 ENCOUNTER — Ambulatory Visit (HOSPITAL_COMMUNITY)
Admission: RE | Admit: 2019-08-24 | Discharge: 2019-08-24 | Disposition: A | Discharge status: Home / Self Care | Payer: Medicare Other | Source: Ambulatory Visit | Attending: Cardiology | Admitting: Cardiology

## 2019-08-24 ENCOUNTER — Other Ambulatory Visit: Payer: Self-pay

## 2019-08-24 DIAGNOSIS — Z8673 Personal history of transient ischemic attack (TIA), and cerebral infarction without residual deficits: Secondary | ICD-10-CM | POA: Diagnosis not present

## 2019-08-24 DIAGNOSIS — Q2543 Congenital aneurysm of aorta: Secondary | ICD-10-CM | POA: Insufficient documentation

## 2019-08-24 DIAGNOSIS — U071 COVID-19: Secondary | ICD-10-CM | POA: Diagnosis not present

## 2019-08-24 DIAGNOSIS — I110 Hypertensive heart disease with heart failure: Secondary | ICD-10-CM | POA: Diagnosis not present

## 2019-08-24 DIAGNOSIS — Z8616 Personal history of COVID-19: Secondary | ICD-10-CM | POA: Insufficient documentation

## 2019-08-24 DIAGNOSIS — I509 Heart failure, unspecified: Secondary | ICD-10-CM | POA: Diagnosis not present

## 2019-08-24 DIAGNOSIS — I5033 Acute on chronic diastolic (congestive) heart failure: Secondary | ICD-10-CM | POA: Diagnosis not present

## 2019-08-24 DIAGNOSIS — I10 Essential (primary) hypertension: Secondary | ICD-10-CM | POA: Diagnosis not present

## 2019-08-24 DIAGNOSIS — I4821 Permanent atrial fibrillation: Secondary | ICD-10-CM | POA: Diagnosis not present

## 2019-08-24 DIAGNOSIS — Z87891 Personal history of nicotine dependence: Secondary | ICD-10-CM | POA: Insufficient documentation

## 2019-08-24 DIAGNOSIS — M6281 Muscle weakness (generalized): Secondary | ICD-10-CM | POA: Diagnosis not present

## 2019-08-24 DIAGNOSIS — Z993 Dependence on wheelchair: Secondary | ICD-10-CM | POA: Diagnosis not present

## 2019-08-24 NOTE — Progress Notes (Signed)
  Echocardiogram 2D Echocardiogram has been performed.  Stark Bray Swaim 08/24/2019, 3:01 PM

## 2019-08-25 ENCOUNTER — Telehealth: Payer: Self-pay | Admitting: *Deleted

## 2019-08-25 DIAGNOSIS — I712 Thoracic aortic aneurysm, without rupture: Secondary | ICD-10-CM

## 2019-08-25 DIAGNOSIS — I7121 Aneurysm of the ascending aorta, without rupture: Secondary | ICD-10-CM

## 2019-08-25 DIAGNOSIS — Z993 Dependence on wheelchair: Secondary | ICD-10-CM | POA: Diagnosis not present

## 2019-08-25 DIAGNOSIS — M6281 Muscle weakness (generalized): Secondary | ICD-10-CM | POA: Diagnosis not present

## 2019-08-25 DIAGNOSIS — I509 Heart failure, unspecified: Secondary | ICD-10-CM | POA: Diagnosis not present

## 2019-08-25 NOTE — Telephone Encounter (Signed)
Spoke with the pt and informed him of his echo results and recommendations per Dr. Delton See, for him to have a Chest CTA done, to further evaluate his ascending aortic aneurysm.  Informed the pt that I will place the order in the system and send a message to our CT scheduler to call him back and arrange this.  Pt states that the scheduler will need to call his SN[facility] at [phone], and arrange this appt date and time, for they will be providing him the transportation to this appt.  Informed the pt that I will endorse this information to our CT scheduler.  Pt verbalized understanding and agrees with this plan.

## 2019-08-25 NOTE — Telephone Encounter (Signed)
Pt will need to have his Chest CTA done at [facility] imaging location due to needing a BMET same day as this appt will need to be scheduled.  Unsuccessful with getting Clyda Greener nurse who cares for this pt to answer the phone so that this lab can be drawn there.  He can have his BMET done at [facility] same day as his CT will be done.  [facility] Scheduling dept will call the pts nursing home to arrange this appt

## 2019-08-25 NOTE — Telephone Encounter (Signed)
-----   Message from Lars Masson, MD sent at 08/25/2019  9:33 AM EDT ----- The patient has normal LVEF, no valvular abnormalities, but has ascending aortic aneurysm, please order a chest CTA to further evaluate

## 2019-08-25 NOTE — Telephone Encounter (Signed)
-----   Message from Gaspar Bidding sent at 08/25/2019 10:51 AM EDT ----- Regarding: RE: Pt needs to be scheduled for Chest CT Angio of Aorta per Dr. Delton See I will get him scheduled but he needs new BUN CRE. They expired today How long will it take to get those since he is in a nursing home?  Misty Stanley ----- Message ----- From: Loa Socks, LPN Sent: 5/68/6168  10:24 AM EDT To: Gaspar Bidding, [facility] Subject: Pt needs to be scheduled for Chest CT Angio #  Per Dr. Delton See, this pt needs to be scheduled for a Chest CT Angio of Aorta for newly found ascending aortic aneurysm on echo. Order is placed and pt is aware that you will call to arrange.  You will have to call his nursing home who provides his transportation to arrange this appt.  Their name is [facility] in [place] and the number is [phone].  Please arrange and thank you for all you guys do!  Thanks, [employer]

## 2019-08-25 NOTE — Addendum Note (Signed)
Addended by: Loa Socks on: 08/25/2019 11:45 AM   Modules accepted: Orders

## 2019-08-26 DIAGNOSIS — Z993 Dependence on wheelchair: Secondary | ICD-10-CM | POA: Diagnosis not present

## 2019-08-26 DIAGNOSIS — M6281 Muscle weakness (generalized): Secondary | ICD-10-CM | POA: Diagnosis not present

## 2019-08-26 DIAGNOSIS — I509 Heart failure, unspecified: Secondary | ICD-10-CM | POA: Diagnosis not present

## 2019-08-29 DIAGNOSIS — M6281 Muscle weakness (generalized): Secondary | ICD-10-CM | POA: Diagnosis not present

## 2019-08-29 DIAGNOSIS — I509 Heart failure, unspecified: Secondary | ICD-10-CM | POA: Diagnosis not present

## 2019-08-29 DIAGNOSIS — Z993 Dependence on wheelchair: Secondary | ICD-10-CM | POA: Diagnosis not present

## 2019-08-30 DIAGNOSIS — F331 Major depressive disorder, recurrent, moderate: Secondary | ICD-10-CM | POA: Diagnosis not present

## 2019-08-30 DIAGNOSIS — Z993 Dependence on wheelchair: Secondary | ICD-10-CM | POA: Diagnosis not present

## 2019-08-30 DIAGNOSIS — G4700 Insomnia, unspecified: Secondary | ICD-10-CM | POA: Diagnosis not present

## 2019-08-30 DIAGNOSIS — I509 Heart failure, unspecified: Secondary | ICD-10-CM | POA: Diagnosis not present

## 2019-08-30 DIAGNOSIS — M6281 Muscle weakness (generalized): Secondary | ICD-10-CM | POA: Diagnosis not present

## 2019-08-31 ENCOUNTER — Telehealth: Payer: Self-pay | Admitting: *Deleted

## 2019-08-31 DIAGNOSIS — M6281 Muscle weakness (generalized): Secondary | ICD-10-CM | POA: Diagnosis not present

## 2019-08-31 DIAGNOSIS — I509 Heart failure, unspecified: Secondary | ICD-10-CM | POA: Diagnosis not present

## 2019-08-31 DIAGNOSIS — I7121 Aneurysm of the ascending aorta, without rupture: Secondary | ICD-10-CM

## 2019-08-31 DIAGNOSIS — Z993 Dependence on wheelchair: Secondary | ICD-10-CM | POA: Diagnosis not present

## 2019-08-31 DIAGNOSIS — I712 Thoracic aortic aneurysm, without rupture: Secondary | ICD-10-CM

## 2019-08-31 NOTE — Telephone Encounter (Signed)
[facility] Radiology Dept faxed a cover sheet for an order to draw an I-STAT Creatinine on this pt, same day as he will go there and have his ordered CT ANGIO CHEST on 09/13/19.  Pt will have his I-STAT Creatinine drawn by Radiology Dept same day as CT angio on 09/13/19, prior to his appt at 1:30 pm. Order placed for I-STAT Creatinine.  Tried calling [facility] Radiology Dept to confirm they see this order in Epic, and could not get anyone on the phone.  Number to call back is [phone].  Will call them when I return back to the office on Friday, to confirm they can see this order.

## 2019-09-01 ENCOUNTER — Telehealth: Payer: Self-pay | Admitting: Cardiology

## 2019-09-01 DIAGNOSIS — I509 Heart failure, unspecified: Secondary | ICD-10-CM | POA: Diagnosis not present

## 2019-09-01 DIAGNOSIS — M6281 Muscle weakness (generalized): Secondary | ICD-10-CM | POA: Diagnosis not present

## 2019-09-01 DIAGNOSIS — Z993 Dependence on wheelchair: Secondary | ICD-10-CM | POA: Diagnosis not present

## 2019-09-01 NOTE — Telephone Encounter (Signed)
New message:     Patient would like for some one call his sister to give the results of his ECHO. Please call back.

## 2019-09-01 NOTE — Telephone Encounter (Signed)
I spoke to the patient's sister and made her aware of Echo results.  She verbalized understanding.

## 2019-09-02 DIAGNOSIS — M6281 Muscle weakness (generalized): Secondary | ICD-10-CM | POA: Diagnosis not present

## 2019-09-02 DIAGNOSIS — I509 Heart failure, unspecified: Secondary | ICD-10-CM | POA: Diagnosis not present

## 2019-09-02 DIAGNOSIS — Z993 Dependence on wheelchair: Secondary | ICD-10-CM | POA: Diagnosis not present

## 2019-09-02 NOTE — Telephone Encounter (Signed)
Spoke with [facility] Radiology Dept to confirm if they can see the I-STAT creatinine I placed on this pt to have same day as his CT ANGIO Chest Aorta, and the Tech did confirm they can see this and will draw this same day as his scan.

## 2019-09-05 DIAGNOSIS — N289 Disorder of kidney and ureter, unspecified: Secondary | ICD-10-CM | POA: Diagnosis not present

## 2019-09-05 DIAGNOSIS — Z993 Dependence on wheelchair: Secondary | ICD-10-CM | POA: Diagnosis not present

## 2019-09-05 DIAGNOSIS — I509 Heart failure, unspecified: Secondary | ICD-10-CM | POA: Diagnosis not present

## 2019-09-05 DIAGNOSIS — I69952 Hemiplegia and hemiparesis following unspecified cerebrovascular disease affecting left dominant side: Secondary | ICD-10-CM | POA: Diagnosis not present

## 2019-09-05 DIAGNOSIS — F331 Major depressive disorder, recurrent, moderate: Secondary | ICD-10-CM | POA: Diagnosis not present

## 2019-09-05 DIAGNOSIS — Z7901 Long term (current) use of anticoagulants: Secondary | ICD-10-CM | POA: Diagnosis not present

## 2019-09-05 DIAGNOSIS — E08319 Diabetes mellitus due to underlying condition with unspecified diabetic retinopathy without macular edema: Secondary | ICD-10-CM | POA: Diagnosis not present

## 2019-09-05 DIAGNOSIS — M6281 Muscle weakness (generalized): Secondary | ICD-10-CM | POA: Diagnosis not present

## 2019-09-05 DIAGNOSIS — F419 Anxiety disorder, unspecified: Secondary | ICD-10-CM | POA: Diagnosis not present

## 2019-09-06 DIAGNOSIS — Z993 Dependence on wheelchair: Secondary | ICD-10-CM | POA: Diagnosis not present

## 2019-09-06 DIAGNOSIS — M6281 Muscle weakness (generalized): Secondary | ICD-10-CM | POA: Diagnosis not present

## 2019-09-06 DIAGNOSIS — I509 Heart failure, unspecified: Secondary | ICD-10-CM | POA: Diagnosis not present

## 2019-09-07 DIAGNOSIS — Z993 Dependence on wheelchair: Secondary | ICD-10-CM | POA: Diagnosis not present

## 2019-09-07 DIAGNOSIS — M6281 Muscle weakness (generalized): Secondary | ICD-10-CM | POA: Diagnosis not present

## 2019-09-07 DIAGNOSIS — I509 Heart failure, unspecified: Secondary | ICD-10-CM | POA: Diagnosis not present

## 2019-09-08 DIAGNOSIS — I69852 Hemiplegia and hemiparesis following other cerebrovascular disease affecting left dominant side: Secondary | ICD-10-CM | POA: Diagnosis not present

## 2019-09-08 DIAGNOSIS — Z993 Dependence on wheelchair: Secondary | ICD-10-CM | POA: Diagnosis not present

## 2019-09-08 DIAGNOSIS — M6281 Muscle weakness (generalized): Secondary | ICD-10-CM | POA: Diagnosis not present

## 2019-09-08 DIAGNOSIS — I509 Heart failure, unspecified: Secondary | ICD-10-CM | POA: Diagnosis not present

## 2019-09-08 DIAGNOSIS — M810 Age-related osteoporosis without current pathological fracture: Secondary | ICD-10-CM | POA: Diagnosis not present

## 2019-09-08 DIAGNOSIS — U071 COVID-19: Secondary | ICD-10-CM | POA: Diagnosis not present

## 2019-09-08 DIAGNOSIS — M150 Primary generalized (osteo)arthritis: Secondary | ICD-10-CM | POA: Diagnosis not present

## 2019-09-09 DIAGNOSIS — M810 Age-related osteoporosis without current pathological fracture: Secondary | ICD-10-CM | POA: Diagnosis not present

## 2019-09-09 DIAGNOSIS — M6281 Muscle weakness (generalized): Secondary | ICD-10-CM | POA: Diagnosis not present

## 2019-09-09 DIAGNOSIS — I69852 Hemiplegia and hemiparesis following other cerebrovascular disease affecting left dominant side: Secondary | ICD-10-CM | POA: Diagnosis not present

## 2019-09-09 DIAGNOSIS — U071 COVID-19: Secondary | ICD-10-CM | POA: Diagnosis not present

## 2019-09-09 DIAGNOSIS — Z993 Dependence on wheelchair: Secondary | ICD-10-CM | POA: Diagnosis not present

## 2019-09-09 DIAGNOSIS — I509 Heart failure, unspecified: Secondary | ICD-10-CM | POA: Diagnosis not present

## 2019-09-12 ENCOUNTER — Other Ambulatory Visit: Payer: Medicare Other

## 2019-09-12 DIAGNOSIS — U071 COVID-19: Secondary | ICD-10-CM | POA: Diagnosis not present

## 2019-09-12 DIAGNOSIS — M810 Age-related osteoporosis without current pathological fracture: Secondary | ICD-10-CM | POA: Diagnosis not present

## 2019-09-12 DIAGNOSIS — M6281 Muscle weakness (generalized): Secondary | ICD-10-CM | POA: Diagnosis not present

## 2019-09-12 DIAGNOSIS — I509 Heart failure, unspecified: Secondary | ICD-10-CM | POA: Diagnosis not present

## 2019-09-12 DIAGNOSIS — I69852 Hemiplegia and hemiparesis following other cerebrovascular disease affecting left dominant side: Secondary | ICD-10-CM | POA: Diagnosis not present

## 2019-09-12 DIAGNOSIS — Z993 Dependence on wheelchair: Secondary | ICD-10-CM | POA: Diagnosis not present

## 2019-09-13 ENCOUNTER — Encounter (HOSPITAL_COMMUNITY): Payer: Self-pay

## 2019-09-13 ENCOUNTER — Other Ambulatory Visit: Payer: Self-pay

## 2019-09-13 ENCOUNTER — Ambulatory Visit (HOSPITAL_COMMUNITY)
Admission: RE | Admit: 2019-09-13 | Discharge: 2019-09-13 | Disposition: A | Discharge status: Home / Self Care | Payer: Medicare Other | Source: Ambulatory Visit | Attending: Cardiology | Admitting: Cardiology

## 2019-09-13 DIAGNOSIS — I712 Thoracic aortic aneurysm, without rupture: Secondary | ICD-10-CM | POA: Diagnosis not present

## 2019-09-13 DIAGNOSIS — I7121 Aneurysm of the ascending aorta, without rupture: Secondary | ICD-10-CM

## 2019-09-13 DIAGNOSIS — I77810 Thoracic aortic ectasia: Secondary | ICD-10-CM | POA: Diagnosis not present

## 2019-09-13 LAB — POCT I-STAT CREATININE: Creatinine, Ser: 0.8 mg/dL (ref 0.61–1.24)

## 2019-09-13 MED ORDER — SODIUM CHLORIDE (PF) 0.9 % IJ SOLN
INTRAMUSCULAR | Status: AC
  Filled 2019-09-13: qty 50

## 2019-09-13 MED ORDER — IOHEXOL 350 MG/ML SOLN
100.0000 mL | Freq: Once | INTRAVENOUS | Status: AC | PRN
  Administered 2019-09-13: 100 mL via INTRAVENOUS

## 2019-09-14 ENCOUNTER — Telehealth: Payer: Self-pay | Admitting: *Deleted

## 2019-09-14 DIAGNOSIS — I7121 Aneurysm of the ascending aorta, without rupture: Secondary | ICD-10-CM

## 2019-09-14 DIAGNOSIS — I712 Thoracic aortic aneurysm, without rupture: Secondary | ICD-10-CM

## 2019-09-14 NOTE — Telephone Encounter (Signed)
-----   Message from Lars Masson, MD sent at 09/14/2019  9:46 AM EDT ----- Mild aneurysmal disease of the ascending thoracic aorta which measures 4.3 cm in greatest diameter.  We will repeat annually

## 2019-09-14 NOTE — Telephone Encounter (Signed)
Spoke with both the pt and the pts Sister (he asked for our office to call her and provide these results as well), and informed them both of his CT Angio of Chest Aorta results and recommendations per Dr. Delton See. Informed them both that per Dr. Delton See we will have him repeat this study in one year.  Informed both parties that I will go ahead and place the order in the system for him to have this repeated again at [facility], in one year, and we will make arrangements with his SN[facility], to have this scheduled so they can provide transportation.  Informed both parties they will get a call back from our [facility] schedulers to have this annual study scheduled. Both verbalized understanding and agrees with this plan.

## 2019-09-19 DIAGNOSIS — F419 Anxiety disorder, unspecified: Secondary | ICD-10-CM | POA: Diagnosis not present

## 2019-09-19 DIAGNOSIS — F331 Major depressive disorder, recurrent, moderate: Secondary | ICD-10-CM | POA: Diagnosis not present

## 2019-09-22 DIAGNOSIS — I69359 Hemiplegia and hemiparesis following cerebral infarction affecting unspecified side: Secondary | ICD-10-CM | POA: Diagnosis not present

## 2019-09-22 DIAGNOSIS — I10 Essential (primary) hypertension: Secondary | ICD-10-CM | POA: Diagnosis not present

## 2019-09-22 DIAGNOSIS — I5030 Unspecified diastolic (congestive) heart failure: Secondary | ICD-10-CM | POA: Diagnosis not present

## 2019-09-23 DIAGNOSIS — Z23 Encounter for immunization: Secondary | ICD-10-CM | POA: Diagnosis not present

## 2019-10-03 DIAGNOSIS — F331 Major depressive disorder, recurrent, moderate: Secondary | ICD-10-CM | POA: Diagnosis not present

## 2019-10-03 DIAGNOSIS — F419 Anxiety disorder, unspecified: Secondary | ICD-10-CM | POA: Diagnosis not present

## 2019-10-20 DIAGNOSIS — F330 Major depressive disorder, recurrent, mild: Secondary | ICD-10-CM | POA: Diagnosis not present

## 2019-10-20 DIAGNOSIS — F5101 Primary insomnia: Secondary | ICD-10-CM | POA: Diagnosis not present

## 2019-10-21 DIAGNOSIS — Z23 Encounter for immunization: Secondary | ICD-10-CM | POA: Diagnosis not present

## 2019-10-27 DIAGNOSIS — I10 Essential (primary) hypertension: Secondary | ICD-10-CM | POA: Diagnosis not present

## 2019-10-27 DIAGNOSIS — I5030 Unspecified diastolic (congestive) heart failure: Secondary | ICD-10-CM | POA: Diagnosis not present

## 2019-10-27 DIAGNOSIS — I69359 Hemiplegia and hemiparesis following cerebral infarction affecting unspecified side: Secondary | ICD-10-CM | POA: Diagnosis not present

## 2019-10-31 DIAGNOSIS — F331 Major depressive disorder, recurrent, moderate: Secondary | ICD-10-CM | POA: Diagnosis not present

## 2019-10-31 DIAGNOSIS — F419 Anxiety disorder, unspecified: Secondary | ICD-10-CM | POA: Diagnosis not present

## 2019-11-14 DIAGNOSIS — F419 Anxiety disorder, unspecified: Secondary | ICD-10-CM | POA: Diagnosis not present

## 2019-11-14 DIAGNOSIS — F331 Major depressive disorder, recurrent, moderate: Secondary | ICD-10-CM | POA: Diagnosis not present

## 2019-11-24 DIAGNOSIS — I69359 Hemiplegia and hemiparesis following cerebral infarction affecting unspecified side: Secondary | ICD-10-CM | POA: Diagnosis not present

## 2019-11-24 DIAGNOSIS — I5030 Unspecified diastolic (congestive) heart failure: Secondary | ICD-10-CM | POA: Diagnosis not present

## 2019-11-24 DIAGNOSIS — I10 Essential (primary) hypertension: Secondary | ICD-10-CM | POA: Diagnosis not present

## 2019-12-17 DIAGNOSIS — I509 Heart failure, unspecified: Secondary | ICD-10-CM | POA: Diagnosis not present

## 2019-12-17 DIAGNOSIS — M6281 Muscle weakness (generalized): Secondary | ICD-10-CM | POA: Diagnosis not present

## 2019-12-17 DIAGNOSIS — R2681 Unsteadiness on feet: Secondary | ICD-10-CM | POA: Diagnosis not present

## 2019-12-17 DIAGNOSIS — R293 Abnormal posture: Secondary | ICD-10-CM | POA: Diagnosis not present

## 2019-12-19 DIAGNOSIS — R2681 Unsteadiness on feet: Secondary | ICD-10-CM | POA: Diagnosis not present

## 2019-12-19 DIAGNOSIS — I509 Heart failure, unspecified: Secondary | ICD-10-CM | POA: Diagnosis not present

## 2019-12-19 DIAGNOSIS — M6281 Muscle weakness (generalized): Secondary | ICD-10-CM | POA: Diagnosis not present

## 2019-12-19 DIAGNOSIS — R293 Abnormal posture: Secondary | ICD-10-CM | POA: Diagnosis not present

## 2019-12-21 DIAGNOSIS — I509 Heart failure, unspecified: Secondary | ICD-10-CM | POA: Diagnosis not present

## 2019-12-21 DIAGNOSIS — M6281 Muscle weakness (generalized): Secondary | ICD-10-CM | POA: Diagnosis not present

## 2019-12-21 DIAGNOSIS — R2681 Unsteadiness on feet: Secondary | ICD-10-CM | POA: Diagnosis not present

## 2019-12-21 DIAGNOSIS — R293 Abnormal posture: Secondary | ICD-10-CM | POA: Diagnosis not present

## 2019-12-22 DIAGNOSIS — M6281 Muscle weakness (generalized): Secondary | ICD-10-CM | POA: Diagnosis not present

## 2019-12-22 DIAGNOSIS — R293 Abnormal posture: Secondary | ICD-10-CM | POA: Diagnosis not present

## 2019-12-22 DIAGNOSIS — R2681 Unsteadiness on feet: Secondary | ICD-10-CM | POA: Diagnosis not present

## 2019-12-22 DIAGNOSIS — I509 Heart failure, unspecified: Secondary | ICD-10-CM | POA: Diagnosis not present

## 2019-12-23 DIAGNOSIS — M6281 Muscle weakness (generalized): Secondary | ICD-10-CM | POA: Diagnosis not present

## 2019-12-23 DIAGNOSIS — I509 Heart failure, unspecified: Secondary | ICD-10-CM | POA: Diagnosis not present

## 2019-12-23 DIAGNOSIS — R293 Abnormal posture: Secondary | ICD-10-CM | POA: Diagnosis not present

## 2019-12-23 DIAGNOSIS — R2681 Unsteadiness on feet: Secondary | ICD-10-CM | POA: Diagnosis not present

## 2019-12-26 DIAGNOSIS — F331 Major depressive disorder, recurrent, moderate: Secondary | ICD-10-CM | POA: Diagnosis not present

## 2019-12-26 DIAGNOSIS — I509 Heart failure, unspecified: Secondary | ICD-10-CM | POA: Diagnosis not present

## 2019-12-26 DIAGNOSIS — R2681 Unsteadiness on feet: Secondary | ICD-10-CM | POA: Diagnosis not present

## 2019-12-26 DIAGNOSIS — F419 Anxiety disorder, unspecified: Secondary | ICD-10-CM | POA: Diagnosis not present

## 2019-12-26 DIAGNOSIS — R293 Abnormal posture: Secondary | ICD-10-CM | POA: Diagnosis not present

## 2019-12-26 DIAGNOSIS — M6281 Muscle weakness (generalized): Secondary | ICD-10-CM | POA: Diagnosis not present

## 2019-12-27 DIAGNOSIS — R2681 Unsteadiness on feet: Secondary | ICD-10-CM | POA: Diagnosis not present

## 2019-12-27 DIAGNOSIS — I509 Heart failure, unspecified: Secondary | ICD-10-CM | POA: Diagnosis not present

## 2019-12-27 DIAGNOSIS — R293 Abnormal posture: Secondary | ICD-10-CM | POA: Diagnosis not present

## 2019-12-27 DIAGNOSIS — M6281 Muscle weakness (generalized): Secondary | ICD-10-CM | POA: Diagnosis not present

## 2019-12-28 DIAGNOSIS — I509 Heart failure, unspecified: Secondary | ICD-10-CM | POA: Diagnosis not present

## 2019-12-28 DIAGNOSIS — R293 Abnormal posture: Secondary | ICD-10-CM | POA: Diagnosis not present

## 2019-12-28 DIAGNOSIS — M6281 Muscle weakness (generalized): Secondary | ICD-10-CM | POA: Diagnosis not present

## 2019-12-28 DIAGNOSIS — R2681 Unsteadiness on feet: Secondary | ICD-10-CM | POA: Diagnosis not present

## 2019-12-29 DIAGNOSIS — I509 Heart failure, unspecified: Secondary | ICD-10-CM | POA: Diagnosis not present

## 2019-12-29 DIAGNOSIS — R2681 Unsteadiness on feet: Secondary | ICD-10-CM | POA: Diagnosis not present

## 2019-12-29 DIAGNOSIS — M6281 Muscle weakness (generalized): Secondary | ICD-10-CM | POA: Diagnosis not present

## 2019-12-29 DIAGNOSIS — R293 Abnormal posture: Secondary | ICD-10-CM | POA: Diagnosis not present

## 2019-12-30 DIAGNOSIS — R2681 Unsteadiness on feet: Secondary | ICD-10-CM | POA: Diagnosis not present

## 2019-12-30 DIAGNOSIS — R293 Abnormal posture: Secondary | ICD-10-CM | POA: Diagnosis not present

## 2019-12-30 DIAGNOSIS — I509 Heart failure, unspecified: Secondary | ICD-10-CM | POA: Diagnosis not present

## 2019-12-30 DIAGNOSIS — M6281 Muscle weakness (generalized): Secondary | ICD-10-CM | POA: Diagnosis not present

## 2020-01-01 DIAGNOSIS — I509 Heart failure, unspecified: Secondary | ICD-10-CM | POA: Diagnosis not present

## 2020-01-01 DIAGNOSIS — R2681 Unsteadiness on feet: Secondary | ICD-10-CM | POA: Diagnosis not present

## 2020-01-01 DIAGNOSIS — R293 Abnormal posture: Secondary | ICD-10-CM | POA: Diagnosis not present

## 2020-01-01 DIAGNOSIS — M6281 Muscle weakness (generalized): Secondary | ICD-10-CM | POA: Diagnosis not present

## 2020-01-02 DIAGNOSIS — F331 Major depressive disorder, recurrent, moderate: Secondary | ICD-10-CM | POA: Diagnosis not present

## 2020-01-02 DIAGNOSIS — I509 Heart failure, unspecified: Secondary | ICD-10-CM | POA: Diagnosis not present

## 2020-01-02 DIAGNOSIS — R2681 Unsteadiness on feet: Secondary | ICD-10-CM | POA: Diagnosis not present

## 2020-01-02 DIAGNOSIS — M6281 Muscle weakness (generalized): Secondary | ICD-10-CM | POA: Diagnosis not present

## 2020-01-02 DIAGNOSIS — F419 Anxiety disorder, unspecified: Secondary | ICD-10-CM | POA: Diagnosis not present

## 2020-01-02 DIAGNOSIS — R293 Abnormal posture: Secondary | ICD-10-CM | POA: Diagnosis not present

## 2020-01-03 DIAGNOSIS — M6281 Muscle weakness (generalized): Secondary | ICD-10-CM | POA: Diagnosis not present

## 2020-01-03 DIAGNOSIS — I509 Heart failure, unspecified: Secondary | ICD-10-CM | POA: Diagnosis not present

## 2020-01-03 DIAGNOSIS — R2681 Unsteadiness on feet: Secondary | ICD-10-CM | POA: Diagnosis not present

## 2020-01-03 DIAGNOSIS — R293 Abnormal posture: Secondary | ICD-10-CM | POA: Diagnosis not present

## 2020-01-04 DIAGNOSIS — R2681 Unsteadiness on feet: Secondary | ICD-10-CM | POA: Diagnosis not present

## 2020-01-04 DIAGNOSIS — M6281 Muscle weakness (generalized): Secondary | ICD-10-CM | POA: Diagnosis not present

## 2020-01-04 DIAGNOSIS — R293 Abnormal posture: Secondary | ICD-10-CM | POA: Diagnosis not present

## 2020-01-04 DIAGNOSIS — I509 Heart failure, unspecified: Secondary | ICD-10-CM | POA: Diagnosis not present

## 2020-01-05 DIAGNOSIS — I5030 Unspecified diastolic (congestive) heart failure: Secondary | ICD-10-CM | POA: Diagnosis not present

## 2020-01-05 DIAGNOSIS — R2681 Unsteadiness on feet: Secondary | ICD-10-CM | POA: Diagnosis not present

## 2020-01-05 DIAGNOSIS — I10 Essential (primary) hypertension: Secondary | ICD-10-CM | POA: Diagnosis not present

## 2020-01-05 DIAGNOSIS — M6281 Muscle weakness (generalized): Secondary | ICD-10-CM | POA: Diagnosis not present

## 2020-01-05 DIAGNOSIS — I69359 Hemiplegia and hemiparesis following cerebral infarction affecting unspecified side: Secondary | ICD-10-CM | POA: Diagnosis not present

## 2020-01-05 DIAGNOSIS — I509 Heart failure, unspecified: Secondary | ICD-10-CM | POA: Diagnosis not present

## 2020-01-05 DIAGNOSIS — R293 Abnormal posture: Secondary | ICD-10-CM | POA: Diagnosis not present

## 2020-01-10 DIAGNOSIS — R293 Abnormal posture: Secondary | ICD-10-CM | POA: Diagnosis not present

## 2020-01-10 DIAGNOSIS — R2681 Unsteadiness on feet: Secondary | ICD-10-CM | POA: Diagnosis not present

## 2020-01-10 DIAGNOSIS — I509 Heart failure, unspecified: Secondary | ICD-10-CM | POA: Diagnosis not present

## 2020-01-10 DIAGNOSIS — M6281 Muscle weakness (generalized): Secondary | ICD-10-CM | POA: Diagnosis not present

## 2020-01-10 DIAGNOSIS — I69852 Hemiplegia and hemiparesis following other cerebrovascular disease affecting left dominant side: Secondary | ICD-10-CM | POA: Diagnosis not present

## 2020-01-11 DIAGNOSIS — M6281 Muscle weakness (generalized): Secondary | ICD-10-CM | POA: Diagnosis not present

## 2020-01-11 DIAGNOSIS — R2681 Unsteadiness on feet: Secondary | ICD-10-CM | POA: Diagnosis not present

## 2020-01-11 DIAGNOSIS — I509 Heart failure, unspecified: Secondary | ICD-10-CM | POA: Diagnosis not present

## 2020-01-11 DIAGNOSIS — I69852 Hemiplegia and hemiparesis following other cerebrovascular disease affecting left dominant side: Secondary | ICD-10-CM | POA: Diagnosis not present

## 2020-01-11 DIAGNOSIS — R293 Abnormal posture: Secondary | ICD-10-CM | POA: Diagnosis not present

## 2020-01-12 DIAGNOSIS — R293 Abnormal posture: Secondary | ICD-10-CM | POA: Diagnosis not present

## 2020-01-12 DIAGNOSIS — I509 Heart failure, unspecified: Secondary | ICD-10-CM | POA: Diagnosis not present

## 2020-01-12 DIAGNOSIS — R2681 Unsteadiness on feet: Secondary | ICD-10-CM | POA: Diagnosis not present

## 2020-01-12 DIAGNOSIS — I69852 Hemiplegia and hemiparesis following other cerebrovascular disease affecting left dominant side: Secondary | ICD-10-CM | POA: Diagnosis not present

## 2020-01-12 DIAGNOSIS — M6281 Muscle weakness (generalized): Secondary | ICD-10-CM | POA: Diagnosis not present

## 2020-01-14 DIAGNOSIS — R2681 Unsteadiness on feet: Secondary | ICD-10-CM | POA: Diagnosis not present

## 2020-01-14 DIAGNOSIS — R293 Abnormal posture: Secondary | ICD-10-CM | POA: Diagnosis not present

## 2020-01-14 DIAGNOSIS — I509 Heart failure, unspecified: Secondary | ICD-10-CM | POA: Diagnosis not present

## 2020-01-14 DIAGNOSIS — M6281 Muscle weakness (generalized): Secondary | ICD-10-CM | POA: Diagnosis not present

## 2020-01-14 DIAGNOSIS — I69852 Hemiplegia and hemiparesis following other cerebrovascular disease affecting left dominant side: Secondary | ICD-10-CM | POA: Diagnosis not present

## 2020-01-16 DIAGNOSIS — F331 Major depressive disorder, recurrent, moderate: Secondary | ICD-10-CM | POA: Diagnosis not present

## 2020-01-16 DIAGNOSIS — F419 Anxiety disorder, unspecified: Secondary | ICD-10-CM | POA: Diagnosis not present

## 2020-01-17 DIAGNOSIS — R2681 Unsteadiness on feet: Secondary | ICD-10-CM | POA: Diagnosis not present

## 2020-01-17 DIAGNOSIS — I509 Heart failure, unspecified: Secondary | ICD-10-CM | POA: Diagnosis not present

## 2020-01-17 DIAGNOSIS — M6281 Muscle weakness (generalized): Secondary | ICD-10-CM | POA: Diagnosis not present

## 2020-01-17 DIAGNOSIS — I69852 Hemiplegia and hemiparesis following other cerebrovascular disease affecting left dominant side: Secondary | ICD-10-CM | POA: Diagnosis not present

## 2020-01-17 DIAGNOSIS — R293 Abnormal posture: Secondary | ICD-10-CM | POA: Diagnosis not present

## 2020-01-18 DIAGNOSIS — R2681 Unsteadiness on feet: Secondary | ICD-10-CM | POA: Diagnosis not present

## 2020-01-18 DIAGNOSIS — I509 Heart failure, unspecified: Secondary | ICD-10-CM | POA: Diagnosis not present

## 2020-01-18 DIAGNOSIS — M6281 Muscle weakness (generalized): Secondary | ICD-10-CM | POA: Diagnosis not present

## 2020-01-18 DIAGNOSIS — R293 Abnormal posture: Secondary | ICD-10-CM | POA: Diagnosis not present

## 2020-01-18 DIAGNOSIS — I69852 Hemiplegia and hemiparesis following other cerebrovascular disease affecting left dominant side: Secondary | ICD-10-CM | POA: Diagnosis not present

## 2020-01-19 DIAGNOSIS — M6281 Muscle weakness (generalized): Secondary | ICD-10-CM | POA: Diagnosis not present

## 2020-01-19 DIAGNOSIS — R2681 Unsteadiness on feet: Secondary | ICD-10-CM | POA: Diagnosis not present

## 2020-01-19 DIAGNOSIS — R293 Abnormal posture: Secondary | ICD-10-CM | POA: Diagnosis not present

## 2020-01-19 DIAGNOSIS — I69852 Hemiplegia and hemiparesis following other cerebrovascular disease affecting left dominant side: Secondary | ICD-10-CM | POA: Diagnosis not present

## 2020-01-19 DIAGNOSIS — I509 Heart failure, unspecified: Secondary | ICD-10-CM | POA: Diagnosis not present

## 2020-01-20 DIAGNOSIS — R2681 Unsteadiness on feet: Secondary | ICD-10-CM | POA: Diagnosis not present

## 2020-01-20 DIAGNOSIS — R293 Abnormal posture: Secondary | ICD-10-CM | POA: Diagnosis not present

## 2020-01-20 DIAGNOSIS — I69852 Hemiplegia and hemiparesis following other cerebrovascular disease affecting left dominant side: Secondary | ICD-10-CM | POA: Diagnosis not present

## 2020-01-20 DIAGNOSIS — I509 Heart failure, unspecified: Secondary | ICD-10-CM | POA: Diagnosis not present

## 2020-01-20 DIAGNOSIS — M6281 Muscle weakness (generalized): Secondary | ICD-10-CM | POA: Diagnosis not present

## 2020-01-24 DIAGNOSIS — I509 Heart failure, unspecified: Secondary | ICD-10-CM | POA: Diagnosis not present

## 2020-01-24 DIAGNOSIS — R293 Abnormal posture: Secondary | ICD-10-CM | POA: Diagnosis not present

## 2020-01-24 DIAGNOSIS — I69852 Hemiplegia and hemiparesis following other cerebrovascular disease affecting left dominant side: Secondary | ICD-10-CM | POA: Diagnosis not present

## 2020-01-24 DIAGNOSIS — M6281 Muscle weakness (generalized): Secondary | ICD-10-CM | POA: Diagnosis not present

## 2020-01-24 DIAGNOSIS — R2681 Unsteadiness on feet: Secondary | ICD-10-CM | POA: Diagnosis not present

## 2020-01-25 DIAGNOSIS — I509 Heart failure, unspecified: Secondary | ICD-10-CM | POA: Diagnosis not present

## 2020-01-25 DIAGNOSIS — M6281 Muscle weakness (generalized): Secondary | ICD-10-CM | POA: Diagnosis not present

## 2020-01-25 DIAGNOSIS — I69852 Hemiplegia and hemiparesis following other cerebrovascular disease affecting left dominant side: Secondary | ICD-10-CM | POA: Diagnosis not present

## 2020-01-25 DIAGNOSIS — R293 Abnormal posture: Secondary | ICD-10-CM | POA: Diagnosis not present

## 2020-01-25 DIAGNOSIS — R2681 Unsteadiness on feet: Secondary | ICD-10-CM | POA: Diagnosis not present

## 2020-01-26 DIAGNOSIS — I69852 Hemiplegia and hemiparesis following other cerebrovascular disease affecting left dominant side: Secondary | ICD-10-CM | POA: Diagnosis not present

## 2020-01-26 DIAGNOSIS — I509 Heart failure, unspecified: Secondary | ICD-10-CM | POA: Diagnosis not present

## 2020-01-26 DIAGNOSIS — R2681 Unsteadiness on feet: Secondary | ICD-10-CM | POA: Diagnosis not present

## 2020-01-26 DIAGNOSIS — M6281 Muscle weakness (generalized): Secondary | ICD-10-CM | POA: Diagnosis not present

## 2020-01-26 DIAGNOSIS — R293 Abnormal posture: Secondary | ICD-10-CM | POA: Diagnosis not present

## 2020-01-27 DIAGNOSIS — M6281 Muscle weakness (generalized): Secondary | ICD-10-CM | POA: Diagnosis not present

## 2020-01-27 DIAGNOSIS — I509 Heart failure, unspecified: Secondary | ICD-10-CM | POA: Diagnosis not present

## 2020-01-27 DIAGNOSIS — I69852 Hemiplegia and hemiparesis following other cerebrovascular disease affecting left dominant side: Secondary | ICD-10-CM | POA: Diagnosis not present

## 2020-01-27 DIAGNOSIS — R2681 Unsteadiness on feet: Secondary | ICD-10-CM | POA: Diagnosis not present

## 2020-01-27 DIAGNOSIS — R293 Abnormal posture: Secondary | ICD-10-CM | POA: Diagnosis not present

## 2020-01-28 DIAGNOSIS — R2681 Unsteadiness on feet: Secondary | ICD-10-CM | POA: Diagnosis not present

## 2020-01-28 DIAGNOSIS — I509 Heart failure, unspecified: Secondary | ICD-10-CM | POA: Diagnosis not present

## 2020-01-28 DIAGNOSIS — R293 Abnormal posture: Secondary | ICD-10-CM | POA: Diagnosis not present

## 2020-01-28 DIAGNOSIS — I69852 Hemiplegia and hemiparesis following other cerebrovascular disease affecting left dominant side: Secondary | ICD-10-CM | POA: Diagnosis not present

## 2020-01-28 DIAGNOSIS — M6281 Muscle weakness (generalized): Secondary | ICD-10-CM | POA: Diagnosis not present

## 2020-01-31 DIAGNOSIS — R2681 Unsteadiness on feet: Secondary | ICD-10-CM | POA: Diagnosis not present

## 2020-01-31 DIAGNOSIS — I509 Heart failure, unspecified: Secondary | ICD-10-CM | POA: Diagnosis not present

## 2020-01-31 DIAGNOSIS — R293 Abnormal posture: Secondary | ICD-10-CM | POA: Diagnosis not present

## 2020-01-31 DIAGNOSIS — I69852 Hemiplegia and hemiparesis following other cerebrovascular disease affecting left dominant side: Secondary | ICD-10-CM | POA: Diagnosis not present

## 2020-01-31 DIAGNOSIS — M6281 Muscle weakness (generalized): Secondary | ICD-10-CM | POA: Diagnosis not present

## 2020-02-02 DIAGNOSIS — I10 Essential (primary) hypertension: Secondary | ICD-10-CM | POA: Diagnosis not present

## 2020-02-02 DIAGNOSIS — I5030 Unspecified diastolic (congestive) heart failure: Secondary | ICD-10-CM | POA: Diagnosis not present

## 2020-02-02 DIAGNOSIS — I69359 Hemiplegia and hemiparesis following cerebral infarction affecting unspecified side: Secondary | ICD-10-CM | POA: Diagnosis not present

## 2020-02-03 DIAGNOSIS — R293 Abnormal posture: Secondary | ICD-10-CM | POA: Diagnosis not present

## 2020-02-03 DIAGNOSIS — I69852 Hemiplegia and hemiparesis following other cerebrovascular disease affecting left dominant side: Secondary | ICD-10-CM | POA: Diagnosis not present

## 2020-02-03 DIAGNOSIS — R2681 Unsteadiness on feet: Secondary | ICD-10-CM | POA: Diagnosis not present

## 2020-02-03 DIAGNOSIS — M6281 Muscle weakness (generalized): Secondary | ICD-10-CM | POA: Diagnosis not present

## 2020-02-03 DIAGNOSIS — I509 Heart failure, unspecified: Secondary | ICD-10-CM | POA: Diagnosis not present

## 2020-02-07 DIAGNOSIS — Z20828 Contact with and (suspected) exposure to other viral communicable diseases: Secondary | ICD-10-CM | POA: Diagnosis not present

## 2020-02-07 DIAGNOSIS — Z1383 Encounter for screening for respiratory disorder NEC: Secondary | ICD-10-CM | POA: Diagnosis not present

## 2020-02-16 DIAGNOSIS — I69359 Hemiplegia and hemiparesis following cerebral infarction affecting unspecified side: Secondary | ICD-10-CM | POA: Diagnosis not present

## 2020-02-16 DIAGNOSIS — I10 Essential (primary) hypertension: Secondary | ICD-10-CM | POA: Diagnosis not present

## 2020-02-16 DIAGNOSIS — I5030 Unspecified diastolic (congestive) heart failure: Secondary | ICD-10-CM | POA: Diagnosis not present

## 2020-02-20 ENCOUNTER — Inpatient Hospital Stay (HOSPITAL_COMMUNITY): Payer: Medicare Other

## 2020-02-20 ENCOUNTER — Inpatient Hospital Stay (HOSPITAL_COMMUNITY)
Admission: EM | Admit: 2020-02-20 | Discharge: 2020-02-28 | DRG: 193 | Disposition: A | Discharge status: Skilled Nursing Facility | Payer: Medicare Other | Source: Skilled Nursing Facility | Attending: Internal Medicine | Admitting: Internal Medicine

## 2020-02-20 ENCOUNTER — Encounter (HOSPITAL_COMMUNITY): Payer: Self-pay

## 2020-02-20 ENCOUNTER — Other Ambulatory Visit: Payer: Self-pay

## 2020-02-20 ENCOUNTER — Emergency Department (HOSPITAL_COMMUNITY): Payer: Medicare Other

## 2020-02-20 DIAGNOSIS — J9601 Acute respiratory failure with hypoxia: Secondary | ICD-10-CM | POA: Diagnosis present

## 2020-02-20 DIAGNOSIS — R4182 Altered mental status, unspecified: Secondary | ICD-10-CM | POA: Diagnosis not present

## 2020-02-20 DIAGNOSIS — Z79899 Other long term (current) drug therapy: Secondary | ICD-10-CM | POA: Diagnosis not present

## 2020-02-20 DIAGNOSIS — R1110 Vomiting, unspecified: Secondary | ICD-10-CM | POA: Diagnosis not present

## 2020-02-20 DIAGNOSIS — R0689 Other abnormalities of breathing: Secondary | ICD-10-CM | POA: Diagnosis present

## 2020-02-20 DIAGNOSIS — I5032 Chronic diastolic (congestive) heart failure: Secondary | ICD-10-CM

## 2020-02-20 DIAGNOSIS — R001 Bradycardia, unspecified: Secondary | ICD-10-CM | POA: Diagnosis not present

## 2020-02-20 DIAGNOSIS — E662 Morbid (severe) obesity with alveolar hypoventilation: Secondary | ICD-10-CM | POA: Diagnosis present

## 2020-02-20 DIAGNOSIS — E875 Hyperkalemia: Secondary | ICD-10-CM | POA: Diagnosis present

## 2020-02-20 DIAGNOSIS — J9692 Respiratory failure, unspecified with hypercapnia: Secondary | ICD-10-CM | POA: Diagnosis present

## 2020-02-20 DIAGNOSIS — I4892 Unspecified atrial flutter: Secondary | ICD-10-CM | POA: Diagnosis present

## 2020-02-20 DIAGNOSIS — I517 Cardiomegaly: Secondary | ICD-10-CM | POA: Diagnosis not present

## 2020-02-20 DIAGNOSIS — J189 Pneumonia, unspecified organism: Secondary | ICD-10-CM | POA: Diagnosis not present

## 2020-02-20 DIAGNOSIS — R0602 Shortness of breath: Secondary | ICD-10-CM | POA: Diagnosis not present

## 2020-02-20 DIAGNOSIS — I69351 Hemiplegia and hemiparesis following cerebral infarction affecting right dominant side: Secondary | ICD-10-CM

## 2020-02-20 DIAGNOSIS — R0902 Hypoxemia: Secondary | ICD-10-CM

## 2020-02-20 DIAGNOSIS — I110 Hypertensive heart disease with heart failure: Secondary | ICD-10-CM | POA: Diagnosis present

## 2020-02-20 DIAGNOSIS — Z87891 Personal history of nicotine dependence: Secondary | ICD-10-CM

## 2020-02-20 DIAGNOSIS — L8990 Pressure ulcer of unspecified site, unspecified stage: Secondary | ICD-10-CM | POA: Insufficient documentation

## 2020-02-20 DIAGNOSIS — G40909 Epilepsy, unspecified, not intractable, without status epilepticus: Secondary | ICD-10-CM | POA: Diagnosis present

## 2020-02-20 DIAGNOSIS — J90 Pleural effusion, not elsewhere classified: Secondary | ICD-10-CM | POA: Diagnosis not present

## 2020-02-20 DIAGNOSIS — I5033 Acute on chronic diastolic (congestive) heart failure: Secondary | ICD-10-CM | POA: Diagnosis not present

## 2020-02-20 DIAGNOSIS — R5381 Other malaise: Secondary | ICD-10-CM | POA: Diagnosis not present

## 2020-02-20 DIAGNOSIS — Z8673 Personal history of transient ischemic attack (TIA), and cerebral infarction without residual deficits: Secondary | ICD-10-CM | POA: Diagnosis not present

## 2020-02-20 DIAGNOSIS — E785 Hyperlipidemia, unspecified: Secondary | ICD-10-CM

## 2020-02-20 DIAGNOSIS — I69359 Hemiplegia and hemiparesis following cerebral infarction affecting unspecified side: Secondary | ICD-10-CM | POA: Diagnosis not present

## 2020-02-20 DIAGNOSIS — I482 Chronic atrial fibrillation, unspecified: Secondary | ICD-10-CM | POA: Diagnosis present

## 2020-02-20 DIAGNOSIS — Z7401 Bed confinement status: Secondary | ICD-10-CM | POA: Diagnosis not present

## 2020-02-20 DIAGNOSIS — Z7901 Long term (current) use of anticoagulants: Secondary | ICD-10-CM

## 2020-02-20 DIAGNOSIS — D6862 Lupus anticoagulant syndrome: Secondary | ICD-10-CM | POA: Diagnosis present

## 2020-02-20 DIAGNOSIS — Z20822 Contact with and (suspected) exposure to covid-19: Secondary | ICD-10-CM | POA: Diagnosis present

## 2020-02-20 DIAGNOSIS — J9602 Acute respiratory failure with hypercapnia: Secondary | ICD-10-CM | POA: Diagnosis present

## 2020-02-20 DIAGNOSIS — G9341 Metabolic encephalopathy: Secondary | ICD-10-CM | POA: Diagnosis present

## 2020-02-20 DIAGNOSIS — I6930 Unspecified sequelae of cerebral infarction: Secondary | ICD-10-CM

## 2020-02-20 DIAGNOSIS — Z66 Do not resuscitate: Secondary | ICD-10-CM | POA: Diagnosis present

## 2020-02-20 DIAGNOSIS — Z86718 Personal history of other venous thrombosis and embolism: Secondary | ICD-10-CM

## 2020-02-20 DIAGNOSIS — Z6839 Body mass index (BMI) 39.0-39.9, adult: Secondary | ICD-10-CM

## 2020-02-20 DIAGNOSIS — D6859 Other primary thrombophilia: Secondary | ICD-10-CM

## 2020-02-20 DIAGNOSIS — M2550 Pain in unspecified joint: Secondary | ICD-10-CM | POA: Diagnosis not present

## 2020-02-20 DIAGNOSIS — J9622 Acute and chronic respiratory failure with hypercapnia: Secondary | ICD-10-CM | POA: Diagnosis not present

## 2020-02-20 DIAGNOSIS — I4510 Unspecified right bundle-branch block: Secondary | ICD-10-CM | POA: Diagnosis not present

## 2020-02-20 DIAGNOSIS — R569 Unspecified convulsions: Secondary | ICD-10-CM | POA: Diagnosis not present

## 2020-02-20 DIAGNOSIS — I10 Essential (primary) hypertension: Secondary | ICD-10-CM | POA: Diagnosis present

## 2020-02-20 DIAGNOSIS — N179 Acute kidney failure, unspecified: Secondary | ICD-10-CM | POA: Diagnosis present

## 2020-02-20 DIAGNOSIS — E66813 Obesity, class 3: Secondary | ICD-10-CM | POA: Diagnosis present

## 2020-02-20 DIAGNOSIS — R4020 Unspecified coma: Secondary | ICD-10-CM | POA: Diagnosis not present

## 2020-02-20 DIAGNOSIS — J9600 Acute respiratory failure, unspecified whether with hypoxia or hypercapnia: Secondary | ICD-10-CM | POA: Diagnosis present

## 2020-02-20 LAB — CBC WITH DIFFERENTIAL/PLATELET
Abs Immature Granulocytes: 0.34 10*3/uL — ABNORMAL HIGH (ref 0.00–0.07)
Basophils Absolute: 0.1 10*3/uL (ref 0.0–0.1)
Basophils Relative: 1 %
Eosinophils Absolute: 0 10*3/uL (ref 0.0–0.5)
Eosinophils Relative: 0 %
HCT: 46 % (ref 39.0–52.0)
Hemoglobin: 14.6 g/dL (ref 13.0–17.0)
Immature Granulocytes: 3 %
Lymphocytes Relative: 7 %
Lymphs Abs: 0.8 10*3/uL (ref 0.7–4.0)
MCH: 30.9 pg (ref 26.0–34.0)
MCHC: 31.7 g/dL (ref 30.0–36.0)
MCV: 97.5 fL (ref 80.0–100.0)
Monocytes Absolute: 1.3 10*3/uL — ABNORMAL HIGH (ref 0.1–1.0)
Monocytes Relative: 11 %
Neutro Abs: 9.8 10*3/uL — ABNORMAL HIGH (ref 1.7–7.7)
Neutrophils Relative %: 78 %
Platelets: 239 10*3/uL (ref 150–400)
RBC: 4.72 MIL/uL (ref 4.22–5.81)
RDW: 13.6 % (ref 11.5–15.5)
WBC: 12.3 10*3/uL — ABNORMAL HIGH (ref 4.0–10.5)
nRBC: 0.3 % — ABNORMAL HIGH (ref 0.0–0.2)

## 2020-02-20 LAB — COMPREHENSIVE METABOLIC PANEL
ALT: 124 U/L — ABNORMAL HIGH (ref 0–44)
AST: 136 U/L — ABNORMAL HIGH (ref 15–41)
Albumin: 3.4 g/dL — ABNORMAL LOW (ref 3.5–5.0)
Alkaline Phosphatase: 98 U/L (ref 38–126)
Anion gap: 13 (ref 5–15)
BUN: 22 mg/dL (ref 8–23)
CO2: 29 mmol/L (ref 22–32)
Calcium: 8.5 mg/dL — ABNORMAL LOW (ref 8.9–10.3)
Chloride: 92 mmol/L — ABNORMAL LOW (ref 98–111)
Creatinine, Ser: 1.25 mg/dL — ABNORMAL HIGH (ref 0.61–1.24)
GFR calc Af Amer: >60 mL/min (ref >60)
GFR calc non Af Amer: >60 mL/min (ref >60)
Glucose, Bld: 144 mg/dL — ABNORMAL HIGH (ref 70–99)
Potassium: 6.1 mmol/L — ABNORMAL HIGH (ref 3.5–5.1)
Sodium: 134 mmol/L — ABNORMAL LOW (ref 135–145)
Total Bilirubin: 0.4 mg/dL (ref 0.3–1.2)
Total Protein: 6.7 g/dL (ref 6.5–8.1)

## 2020-02-20 LAB — BLOOD GAS, ARTERIAL
Acid-Base Excess: 1.6 mmol/L (ref 0.0–2.0)
Acid-Base Excess: 4.3 mmol/L — ABNORMAL HIGH (ref 0.0–2.0)
Bicarbonate: 31.7 mmol/L — ABNORMAL HIGH (ref 20.0–28.0)
Bicarbonate: 31.1 mmol/L — ABNORMAL HIGH (ref 20.0–28.0)
Delivery systems: POSITIVE
Drawn by: 560031
FIO2: 60
FIO2: 40
Mode: POSITIVE
O2 Content: 10 L/min
O2 Saturation: 97.2 %
O2 Saturation: 95.6 %
Patient temperature: 98.6
Patient temperature: 97.9
RATE: 24 resp/min
pCO2 arterial: 80.8 mmHg (ref 32.0–48.0)
pCO2 arterial: 56.5 mmHg — ABNORMAL HIGH (ref 32.0–48.0)
pH, Arterial: 7.217 — ABNORMAL LOW (ref 7.350–7.450)
pH, Arterial: 7.357 (ref 7.350–7.450)
pO2, Arterial: 124 mmHg — ABNORMAL HIGH (ref 83.0–108.0)
pO2, Arterial: 79.8 mmHg — ABNORMAL LOW (ref 83.0–108.0)

## 2020-02-20 LAB — URINALYSIS, ROUTINE W REFLEX MICROSCOPIC
Bilirubin Urine: NEGATIVE
Glucose, UA: NEGATIVE mg/dL
Ketones, ur: NEGATIVE mg/dL
Nitrite: NEGATIVE
Protein, ur: NEGATIVE mg/dL
Specific Gravity, Urine: 1.03 (ref 1.005–1.030)
pH: 5 (ref 5.0–8.0)

## 2020-02-20 LAB — LACTIC ACID, PLASMA
Lactic Acid, Venous: 2.7 mmol/L (ref 0.5–1.9)
Lactic Acid, Venous: 1.4 mmol/L (ref 0.5–1.9)
Lactic Acid, Venous: 1.8 mmol/L (ref 0.5–1.9)
Lactic Acid, Venous: 1.8 mmol/L (ref 0.5–1.9)

## 2020-02-20 LAB — VITAMIN B12: Vitamin B-12: 352 pg/mL (ref 180–914)

## 2020-02-20 LAB — I-STAT CHEM 8, ED
BUN: 23 mg/dL (ref 8–23)
Calcium, Ion: 1.12 mmol/L — ABNORMAL LOW (ref 1.15–1.40)
Chloride: 93 mmol/L — ABNORMAL LOW (ref 98–111)
Creatinine, Ser: 1.3 mg/dL — ABNORMAL HIGH (ref 0.61–1.24)
Glucose, Bld: 138 mg/dL — ABNORMAL HIGH (ref 70–99)
HCT: 45 % (ref 39.0–52.0)
Hemoglobin: 15.3 g/dL (ref 13.0–17.0)
Potassium: 5.8 mmol/L — ABNORMAL HIGH (ref 3.5–5.1)
Sodium: 132 mmol/L — ABNORMAL LOW (ref 135–145)
TCO2: 30 mmol/L (ref 22–32)

## 2020-02-20 LAB — PROCALCITONIN: Procalcitonin: 0.15 ng/mL

## 2020-02-20 LAB — PHENYTOIN LEVEL, TOTAL: Phenytoin Lvl: 11.2 ug/mL (ref 10.0–20.0)

## 2020-02-20 LAB — MRSA PCR SCREENING: MRSA by PCR: NEGATIVE

## 2020-02-20 LAB — PROTIME-INR
INR: 3.9 — ABNORMAL HIGH (ref 0.8–1.2)
INR: 4.3 (ref 0.8–1.2)
Prothrombin Time: 36.7 seconds — ABNORMAL HIGH (ref 11.4–15.2)
Prothrombin Time: 39.8 seconds — ABNORMAL HIGH (ref 11.4–15.2)

## 2020-02-20 LAB — BRAIN NATRIURETIC PEPTIDE: B Natriuretic Peptide: 670 pg/mL — ABNORMAL HIGH (ref 0.0–100.0)

## 2020-02-20 LAB — RAPID URINE DRUG SCREEN, HOSP PERFORMED
Amphetamines: NOT DETECTED
Barbiturates: NOT DETECTED
Benzodiazepines: NOT DETECTED
Cocaine: NOT DETECTED
Opiates: NOT DETECTED
Tetrahydrocannabinol: NOT DETECTED

## 2020-02-20 LAB — LIPASE, BLOOD: Lipase: 57 U/L — ABNORMAL HIGH (ref 11–51)

## 2020-02-20 LAB — TROPONIN I (HIGH SENSITIVITY)
Troponin I (High Sensitivity): 34 ng/L — ABNORMAL HIGH (ref <18)
Troponin I (High Sensitivity): 33 ng/L — ABNORMAL HIGH (ref <18)

## 2020-02-20 LAB — AMMONIA: Ammonia: 35 umol/L (ref 9–35)

## 2020-02-20 LAB — APTT: aPTT: 36 seconds (ref 24–36)

## 2020-02-20 LAB — SARS CORONAVIRUS 2 BY RT PCR (HOSPITAL ORDER, PERFORMED IN ~~LOC~~ HOSPITAL LAB): SARS Coronavirus 2: NEGATIVE

## 2020-02-20 LAB — STREP PNEUMONIAE URINARY ANTIGEN: Strep Pneumo Urinary Antigen: NEGATIVE

## 2020-02-20 MED ORDER — CALCIUM GLUCONATE-NACL 1-0.675 GM/50ML-% IV SOLN
1.0000 g | Freq: Once | INTRAVENOUS | Status: AC
  Administered 2020-02-20: 1000 mg via INTRAVENOUS
  Filled 2020-02-20: qty 50

## 2020-02-20 MED ORDER — DEXTROSE 50 % IV SOLN
1.0000 | Freq: Once | INTRAVENOUS | Status: AC
  Administered 2020-02-20: 50 mL via INTRAVENOUS
  Filled 2020-02-20: qty 50

## 2020-02-20 MED ORDER — INSULIN ASPART 100 UNIT/ML ~~LOC~~ SOLN
10.0000 [IU] | Freq: Once | SUBCUTANEOUS | Status: AC
  Administered 2020-02-20: 10 [IU] via INTRAVENOUS
  Filled 2020-02-20: qty 0.1

## 2020-02-20 MED ORDER — CHLORHEXIDINE GLUCONATE 0.12 % MT SOLN
15.0000 mL | Freq: Two times a day (BID) | OROMUCOSAL | Status: DC
  Administered 2020-02-22 – 2020-02-23 (×2): 15 mL via OROMUCOSAL
  Filled 2020-02-20 (×8): qty 15

## 2020-02-20 MED ORDER — FUROSEMIDE 10 MG/ML IJ SOLN
40.0000 mg | Freq: Once | INTRAMUSCULAR | Status: AC
  Administered 2020-02-20: 40 mg via INTRAVENOUS
  Filled 2020-02-20: qty 4

## 2020-02-20 MED ORDER — CHLORHEXIDINE GLUCONATE CLOTH 2 % EX PADS
6.0000 | MEDICATED_PAD | Freq: Every day | CUTANEOUS | Status: DC
  Administered 2020-02-20 – 2020-02-24 (×5): 6 via TOPICAL

## 2020-02-20 MED ORDER — CHLORHEXIDINE GLUCONATE 0.12 % MT SOLN
15.0000 mL | Freq: Two times a day (BID) | OROMUCOSAL | Status: DC
  Administered 2020-02-20 – 2020-02-24 (×7): 15 mL via OROMUCOSAL
  Filled 2020-02-20 (×2): qty 15

## 2020-02-20 MED ORDER — ORAL CARE MOUTH RINSE
15.0000 mL | Freq: Two times a day (BID) | OROMUCOSAL | Status: DC
  Administered 2020-02-21 – 2020-02-22 (×3): 15 mL via OROMUCOSAL

## 2020-02-20 MED ORDER — DOCUSATE SODIUM 100 MG PO CAPS
100.0000 mg | ORAL_CAPSULE | Freq: Two times a day (BID) | ORAL | Status: DC | PRN
  Administered 2020-02-23: 100 mg via ORAL
  Filled 2020-02-20: qty 1

## 2020-02-20 MED ORDER — IOHEXOL 350 MG/ML SOLN
100.0000 mL | Freq: Once | INTRAVENOUS | Status: AC | PRN
  Administered 2020-02-20: 100 mL via INTRAVENOUS

## 2020-02-20 MED ORDER — SODIUM CHLORIDE 0.9 % IV SOLN
INTRAVENOUS | Status: DC | PRN
  Administered 2020-02-20 – 2020-02-23 (×2): 250 mL via INTRAVENOUS

## 2020-02-20 MED ORDER — SODIUM CHLORIDE 0.9 % IV SOLN
2.0000 g | Freq: Three times a day (TID) | INTRAVENOUS | Status: DC
  Administered 2020-02-20 – 2020-02-22 (×6): 2 g via INTRAVENOUS
  Filled 2020-02-20 (×6): qty 2

## 2020-02-20 MED ORDER — ORAL CARE MOUTH RINSE
15.0000 mL | Freq: Two times a day (BID) | OROMUCOSAL | Status: DC
  Administered 2020-02-21: 15 mL via OROMUCOSAL

## 2020-02-20 MED ORDER — IPRATROPIUM-ALBUTEROL 0.5-2.5 (3) MG/3ML IN SOLN
3.0000 mL | Freq: Four times a day (QID) | RESPIRATORY_TRACT | Status: DC
  Administered 2020-02-20 – 2020-02-21 (×5): 3 mL via RESPIRATORY_TRACT
  Filled 2020-02-20 (×5): qty 3

## 2020-02-20 MED ORDER — VANCOMYCIN HCL 2000 MG/400ML IV SOLN
2000.0000 mg | Freq: Once | INTRAVENOUS | Status: AC
  Administered 2020-02-20: 2000 mg via INTRAVENOUS
  Filled 2020-02-20: qty 400

## 2020-02-20 MED ORDER — POLYETHYLENE GLYCOL 3350 17 G PO PACK: 17.0000 g | PACK | Freq: Every day | ORAL | Status: DC | PRN

## 2020-02-20 NOTE — Consult Note (Addendum)
Addendum: This note should be listed as an H&P. Consult note was entered incorrectly as the document heading.   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  Per EMS report he had nausea and vomiting for 3 days prior and was found responsive this morning at his nursing home. He was noted to have an oxygen saturation around 49% and was placed on a simple facemask with improvement in his oxygen saturations. He was responsive to verbal stimuli.   Lab work up showed potassium of 6.1, Cr 1.25 (Baseline 0.6). BNP 670. Troponin 34. Lactic acid 2.7. WBC count 12.3k. Urinalysis with moderate hemoglobin.  CT head did not show acute pathology. CTA chest did not indicate pulmonary emboli. Small dependent bilateral pleural effusions. Patchy ground glass areas, scattered. EKG shows atrial fibrillation or flutter, inverted T waves in leads V1-V5 similar to prior EKG 07/2019.  Patient was initially admitted to the hospitalist service but due to the persistent somnolence an ABG was obtained showing pH 7.2, pCO2 80, pO2 124. PCCM was then consulted.   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 Normal LV and RV function. No significant valve abnormalities. RVSP 29.28mmHg.  CT Head 9/13: no acute abnormalities  CTA Chest 9/13: No PE. Respiratory Motion artifact. Small bilateral pleural effusions and dependent atelectasis.   Micro Data:  Bld 9/13>> Urine 9/13>>  Antimicrobials:  Cefepime 9/13>> Vancomycin 9/13>>  Interim history/subjective:  Patient has been started on bipap therapy in the ED. He remains hemodynamically stable. He will be admitted to the ICU for further requirement of bipap.  Objective   Blood pressure 138/87, pulse (!) 51, temperature 97.9 F (36.6 C), temperature source Oral, resp. rate 20, height 6\' 1"  (1.854 m), weight 135 kg, SpO2 95 %.        Intake/Output Summary (Last 24 hours) at 02/20/2020 1503 Last data filed at 02/20/2020 1420 Gross per 24 hour  Intake 150 ml  Output --  Net 150 ml   Filed Weights   02/20/20 0948  Weight: 135 kg    Examination: General: somnolent, no acute distress HENT: PERRL, sclera anicteric Lungs: Diminished breath sounds. 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 He is being admitted to the ICU for acute hypercapnic respiratory failure.   Acute Hypoxemic and Hypercapnic Respiratory Failure Likely in setting of obesity,  underlying sleep apnea and pneumonia - Trial on bipap as patient is DNR/DNI. Patient's sister agreed to trial of bipap and understood the risks of therapy due to his altered mentation - Start duonebs scheduled - Will diurese as able - CTA negative for PE. He is suprtherpeutic on coumadin therapy with INR 3.9, so less likely thromboembolic disease contributing.   Encephalopathy Likely in setting of hypercapnia - Bipap as above - CT Head unremarkable for acute pathology - Check urine drug screen - TSH normal in 07/2019 - Check B12 - Check phenytoin level  Acute Kidney Injury: Unknown etiology as there is report for pre-renal etiology given poor oral intake and vomiting vs hypoxic injury from respiratory failure - Monitor BMP and UOP  Hypercoagulable State - maintain INR of 2-3 - hold coumadin therapy for now  Seizure Disorder - check phenytoin level - hold antiepileptics for now - no reports of seizure activity from nursing home or EMS reports  Best practice:  Diet: NPO Pain/Anxiety/Delirium protocol (if indicated): n/a VAP protocol (if indicated): n/a DVT prophylaxis: coumadin GI prophylaxis: n/a Glucose control: n/a, SSI if needed Mobility: bed rest Code Status: DNR/DNI Family Communication: Spoke with sister 9/13 Disposition: ICU  Labs   CBC: Recent Labs  Lab 02/20/20 1002 02/20/20 1018  WBC 12.3*  --   NEUTROABS 9.8*  --   HGB 14.6 15.3  HCT 46.0 45.0  MCV 97.5  --   PLT 239  --     Basic Metabolic Panel: Recent Labs  Lab 02/20/20 1002 02/20/20 1018  NA 134* 132*  K 6.1* 5.8*  CL 92* 93*  CO2 29  --   GLUCOSE 144* 138*  BUN 22 23  CREATININE 1.25* 1.30*  CALCIUM 8.5*  --    GFR: Estimated Creatinine Clearance: 86 mL/min (A) (by C-G formula based on SCr of 1.3 mg/dL (H)). Recent Labs  Lab 02/20/20 1002 02/20/20 1248  WBC 12.3*  --   LATICACIDVEN 2.7* 1.4    Liver Function Tests: Recent Labs  Lab 02/20/20 1002  AST 136*  ALT 124*   ALKPHOS 98  BILITOT 0.4  PROT 6.7  ALBUMIN 3.4*   Recent Labs  Lab 02/20/20 1002  LIPASE 57*   No results for input(s): AMMONIA in the last 168 hours.  ABG    Component Value Date/Time   PHART 7.217 (L) 02/20/2020 1336   PCO2ART 80.8 (HH) 02/20/2020 1336   PO2ART 124 (H) 02/20/2020 1336   HCO3 31.7 (H) 02/20/2020 1336   TCO2 30 02/20/2020 1018   O2SAT 97.2 02/20/2020 1336     Coagulation Profile: Recent Labs  Lab 02/20/20 1002  INR 3.9*    Cardiac Enzymes: No results for input(s): CKTOTAL, CKMB, CKMBINDEX, TROPONINI in the last 168 hours.  HbA1C: Hgb A1c MFr Bld  Date/Time Value Ref Range Status  12/16/2010 05:50 AM 5.4 <5.7 % Final    Comment:    (NOTE)  According to the ADA Clinical Practice Recommendations for 2011, when HbA1c is used as a screening test:  >=6.5%   Diagnostic of Diabetes Mellitus           (if abnormal result is confirmed) 5.7-6.4%   Increased risk of developing Diabetes Mellitus References:Diagnosis and Classification of Diabetes Mellitus,Diabetes Care,2011,34(Suppl 1):S62-S69 and Standards of Medical Care in         Diabetes - 2011,Diabetes Care,2011,34 (Suppl 1):S11-S61.  07/15/2008 01:20 PM  4.6 - 6.1 % Final   5.0 (NOTE)   The ADA recommends the following therapeutic goal for glycemic   control related to Hgb A1C measurement:   Goal of Therapy:   < 7.0% Hgb A1C   Reference: American Diabetes Association: Clinical Practice   Recommendations 2008, Diabetes Care,  2008, 31:(Suppl 1).    CBG: No results for input(s): GLUCAP in the last 168 hours.  Review of Systems:   Unable to perform ROS due to encephalopathy  Past Medical History  He,  has a past medical history of Acute on chronic congestive heart failure (HCC), Anemia, secondary, Anxiety disorder, Bloody stool (08/29/2011), CVA (cerebral vascular accident) (HCC) (2010), Depression, DVT of lower extremity (deep  venous thrombosis) (HCC), Dysphagia, Hyperlipidemia, Hypertension, Lupus anticoagulant disorder (HCC) (1990), Morbid obesity (HCC), PFO (patent foramen ovale), Protein C deficiency (HCC), Protein S deficiency (HCC), Rectus sheath hematoma (7/12), and Seizure disorder (HCC).   Surgical History    Past Surgical History:  Procedure Laterality Date  . dental extraction     multiple  . vena cavogram  12/2010   INFERIOR     Social History   reports that he has quit smoking. He has never used smokeless tobacco. He reports that he does not drink alcohol and does not use drugs.   Family History   His family history is not on file.   Allergies Allergies  Allergen Reactions  . Fenofibrate Other (See Comments)    Per MAR     Home Medications  Prior to Admission medications   Medication Sig Start Date End Date Taking? Authorizing Provider  acetaminophen (TYLENOL) 325 MG tablet Take 650 mg by mouth every 6 (six) hours as needed for mild pain or headache.    Yes [provider]  Carboxymeth-Glycerin-Polysorb (REFRESH OPTIVE ADVANCED OP) Place 1 drop into both eyes 4 (four) times daily. Wait 3-5 minutes between eye meds   Yes [provider]  ezetimibe (ZETIA) 10 MG tablet Take 10 mg by mouth daily.   Yes [provider]  fish oil-omega-3 fatty acids 1000 MG capsule Take 1 g by mouth 2 (two) times daily.    Yes [provider]  furosemide (LASIX) 40 MG tablet Take 40 mg by mouth 2 (two) times daily.   Yes [provider]  gabapentin (NEURONTIN) 300 MG capsule Take 300 mg by mouth 3 (three) times daily.     Yes [provider]  guaifenesin (ROBITUSSIN) 100 MG/5ML syrup Take 200 mg by mouth every 4 (four) hours as needed for cough.   Yes [provider]  hydrOXYzine (ATARAX/VISTARIL) 10 MG tablet Take 10 mg by mouth 3 (three) times daily as needed for itching.   Yes [provider]  metoprolol tartrate (LOPRESSOR) 25 MG tablet  Take 0.5 tablets (12.5 mg total) by mouth 2 (two) times daily. 12/28/18  Yes Sharol Harness, Brittainy M, PA-C  omeprazole (PRILOSEC) 20 MG capsule Take 20 mg by mouth at bedtime.    Yes [provider]  ondansetron (  ZOFRAN) 4 MG tablet Take 4 mg by mouth every 8 (eight) hours as needed for nausea or vomiting.  06/09/19  Yes [provider]  phenytoin (DILANTIN) 100 MG ER capsule Take 200 mg by mouth 2 (two) times daily.   Yes [provider]  potassium chloride (K-DUR) 10 MEQ tablet Take 2 tablets (20 mEq total) by mouth daily. 08/06/18  Yes Alwyn Ren, MD  RESTASIS 0.05 % ophthalmic emulsion Place 1 drop into both eyes 2 (two) times daily. 06/03/18  Yes [provider]  rosuvastatin (CRESTOR) 40 MG tablet Take 40 mg by mouth at bedtime.    Yes [provider]  Skin Protectants, Misc. (EUCERIN) cream Apply 1 application topically daily as needed for dry skin.    Yes [provider]  spironolactone (ALDACTONE) 25 MG tablet Take 1 tablet (25 mg total) by mouth daily. 08/07/18  Yes Alwyn Ren, MD  traZODone (DESYREL) 50 MG tablet Take 50 mg by mouth at bedtime. 07/08/18  Yes [provider]  triamcinolone cream (KENALOG) 0.1 % Apply 1 application topically daily as needed (rash).   Yes [provider]  venlafaxine (EFFEXOR) 75 MG tablet Take 75 mg by mouth daily. 07/15/18  Yes [provider]  warfarin (COUMADIN) 1 MG tablet Take 3.5 mg by mouth daily.    Yes [provider]  torsemide (DEMADEX) 20 MG tablet Take 2 tablets (40 mg total) by mouth daily. Patient not taking: Reported on 02/20/2020 07/15/19   Lars Masson, MD     Critical care time: 51     Melody Comas, MD [facility] Office: [phone]   See Amion for Pager Details

## 2020-02-20 NOTE — Progress Notes (Signed)
RT Note: Pt. transported uneventfully from ED-R/A on BiPAP V-60 to ICU/SD-35, taken off BiPAP after settled from transport and being placed on unit monitor, placed on humidified 3 lpm n/c after previous ABG/blood work reviewed, which is his baseline @ [facility], currently able to speak full sentences with only minimal distress, RT covering unit made aware, BiPAP remains @ bedside, AMBU in room, pulse ox via spot check prior to exit 93%/P-98/R-24, RT to monitor.

## 2020-02-20 NOTE — Progress Notes (Signed)
RT collected ABG @1336  sent to lab @1338 , lab notified. RT will monitor.

## 2020-02-20 NOTE — Progress Notes (Signed)
Pt placed on BiPAP per MD order. Pt tolerating well at this time with saturations of 98%. RT will draw a follow up ABG, RT will continue to monitor.

## 2020-02-20 NOTE — ED Notes (Signed)
Date and time results received: 02/20/20 10:49 AM (use smartphrase ".now" to insert current time)  Test: Lactic Critical Value: 2.7  Name of Provider Notified: Myrtis Ser  Orders Received? Or Actions Taken?: Orders Received - See Orders for details

## 2020-02-20 NOTE — ED Triage Notes (Signed)
Pt arrived from [facility] via EMS. Per EMS pt has had n/v x3 days, found unresponsive this morning spo2 49% RA. Placed on simple mask at 10L spo2 92% in triage. Pt responsive to verbal stimuli.

## 2020-02-20 NOTE — ED Provider Notes (Addendum)
[facility]-EMERGENCY DEPT Provider Note   CSN: [id number] Arrival date & time: 02/20/20  0934     History Chief Complaint  Patient presents with  . Altered Mental Status    James Villegas is a 64 y.o. male.  The history is provided by the patient and the EMS personnel.  Altered Mental Status Presenting symptoms: lethargy and partial responsiveness   Severity:  Moderate Most recent episode:  Today Episode history:  Single Timing:  Constant Progression:  Partially resolved Chronicity:  New Context comment:  Recent vomiting, otherwise normal prior Associated symptoms: nausea, vomiting and weakness (chronic)   Associated symptoms: no fever, no headaches, no light-headedness, no palpitations and no rash        Past Medical History:  Diagnosis Date  . Acute on chronic congestive heart failure (HCC)   . Anemia, secondary    SECONDARY TO ACUTE BLOOD LOSS  . Anxiety disorder   . Bloody stool 08/29/2011   intermittent along with constipation.   . 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 Depression   . 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 Dysphagia   . Hyperlipidemia   . Hypertension   . Lupus anticoagulant disorder (HCC) 1990  . Morbid obesity (HCC)   . PFO (patent foramen ovale)    TEE 2/10: EF 60%, trivial AI, mild Ao root dilatation, mod PFO with R-L shunting, atrial septal aneurysm;   echo 7/12: EF 65-70%, grade 1 diast dysfxn, mild MR, LVOT showed severe obstruction  . Protein C deficiency (HCC)   . Protein S deficiency (HCC)   . Rectus sheath hematoma 7/12   required reversal of anticoagulation and c/b DVT req. IVC filter  . Seizure disorder [facility])     Patient Active Problem List   Diagnosis Date Noted  . Acute respiratory failure (HCC) 02/20/2020  . CAP (community acquired pneumonia) 02/20/2020  . Obesity, Class III, BMI 40-49.9  (morbid obesity) (HCC) 02/20/2020  . Bradycardia 02/20/2020  . Hyperkalemia 02/20/2020  . Hypercapnic respiratory failure (HCC) 02/20/2020  . Acute on chronic congestive heart failure (HCC)   . New onset atrial fibrillation (HCC)   . Chronic anticoagulation   . Respiratory failure (HCC) 08/02/2018  . Anemia of other chronic disease 11/23/2013  . Pain in joint, ankle and foot 11/14/2013  . Other convulsions 10/19/2013  . Noninfectious gastroenteritis and colitis 07/29/2013  . Left upper quadrant abdominal mass 07/28/2013  . Other specified disease of white blood cells 07/16/2013  . Ingrowing toenail with infection 05/20/2013  . Late effects of cerebrovascular disease 10/15/2012  . Pure hypercholesterolemia 10/15/2012  . Essential hypertension, benign 10/15/2012  . Hereditary and idiopathic peripheral neuropathy 10/15/2012  . 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 Cerebral infarction (HCC) 08/29/2011  . DVT (deep venous thrombosis) (HCC) 08/29/2011  . Bloody stool 08/29/2011  . PFO (patent foramen ovale) 02/18/2011    Past Surgical History:  Procedure Laterality Date  . dental extraction     multiple  . vena cavogram  12/2010   INFERIOR       History reviewed. No pertinent family history.  Social History   Tobacco Use  . Smoking status: Former Games developer  . Smokeless tobacco: Never Used  Substance Use Topics  . 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  Drug use: No    Home Medications Prior to Admission medications   Medication Sig Start Date End Date Taking? Authorizing Provider  acetaminophen (TYLENOL) 325 MG tablet Take 650 mg by mouth every 6 (six) hours as needed for mild pain or headache.    Yes [provider]  Carboxymeth-Glycerin-Polysorb (REFRESH OPTIVE ADVANCED OP) Place 1 drop into both eyes 4 (four) times daily. Wait 3-5 minutes between eye meds   Yes [provider]  ezetimibe (ZETIA) 10 MG  tablet Take 10 mg by mouth daily.   Yes [provider]  fish oil-omega-3 fatty acids 1000 MG capsule Take 1 g by mouth 2 (two) times daily.    Yes [provider]  furosemide (LASIX) 40 MG tablet Take 40 mg by mouth 2 (two) times daily.   Yes [provider]  gabapentin (NEURONTIN) 300 MG capsule Take 300 mg by mouth 3 (three) times daily.     Yes [provider]  guaifenesin (ROBITUSSIN) 100 MG/5ML syrup Take 200 mg by mouth every 4 (four) hours as needed for cough.   Yes [provider]  hydrOXYzine (ATARAX/VISTARIL) 10 MG tablet Take 10 mg by mouth 3 (three) times daily as needed for itching.   Yes [provider]  metoprolol tartrate (LOPRESSOR) 25 MG tablet Take 0.5 tablets (12.5 mg total) by mouth 2 (two) times daily. 12/28/18  Yes Sharol Harness, Brittainy M, PA-C  omeprazole (PRILOSEC) 20 MG capsule Take 20 mg by mouth at bedtime.    Yes [provider]  ondansetron (ZOFRAN) 4 MG tablet Take 4 mg by mouth every 8 (eight) hours as needed for nausea or vomiting.  06/09/19  Yes [provider]  phenytoin (DILANTIN) 100 MG ER capsule Take 200 mg by mouth 2 (two) times daily.   Yes [provider]  potassium chloride (K-DUR) 10 MEQ tablet Take 2 tablets (20 mEq total) by mouth daily. 08/06/18  Yes Alwyn Ren, MD  RESTASIS 0.05 % ophthalmic emulsion Place 1 drop into both eyes 2 (two) times daily. 06/03/18  Yes [provider]  rosuvastatin (CRESTOR) 40 MG tablet Take 40 mg by mouth at bedtime.    Yes [provider]  Skin Protectants, Misc. (EUCERIN) cream Apply 1 application topically daily as needed for dry skin.    Yes [provider]  spironolactone (ALDACTONE) 25 MG tablet Take 1 tablet (25 mg total) by mouth daily. 08/07/18  Yes Alwyn Ren, MD  traZODone (DESYREL) 50 MG tablet Take 50 mg by mouth at bedtime. 07/08/18  Yes [provider]  triamcinolone cream  (KENALOG) 0.1 % Apply 1 application topically daily as needed (rash).   Yes [provider]  venlafaxine (EFFEXOR) 75 MG tablet Take 75 mg by mouth daily. 07/15/18  Yes [provider]  warfarin (COUMADIN) 1 MG tablet Take 3.5 mg by mouth daily.    Yes [provider]  torsemide (DEMADEX) 20 MG tablet Take 2 tablets (40 mg total) by mouth daily. Patient not taking: Reported on 02/20/2020 07/15/19   Lars Masson, MD    Allergies    Fenofibrate  Review of Systems   Review of Systems  Constitutional: Positive for appetite change. Negative for chills and fever.  HENT: Negative for congestion and rhinorrhea.   Respiratory: Negative for cough and shortness of breath.   Cardiovascular: Negative for chest pain and palpitations.  Gastrointestinal: Positive for nausea and vomiting. Negative for diarrhea.  Genitourinary: Negative for difficulty urinating and dysuria.  Musculoskeletal: Negative for arthralgias and back pain.  Skin: Negative for color change and rash.  Neurological: Positive for weakness (chronic). Negative for light-headedness and headaches.  Psychiatric/Behavioral: Positive for behavioral problems.    Physical Exam Updated Vital Signs BP (!) 131/93   Pulse 79   Temp 97.9 F (36.6 C) (Oral)   Resp (!) 24   Ht 6\' 1"  (1.854 m)   Wt 135 kg   SpO2 98%   BMI 39.27 kg/m   Physical Exam Vitals and nursing note reviewed.  Constitutional:      General: He is not in acute distress.    Appearance: Normal appearance.  HENT:     Head: Normocephalic and atraumatic.     Nose: No rhinorrhea.  Eyes:     General:        Right eye: No discharge.        Left eye: No discharge.     Conjunctiva/sclera: Conjunctivae normal.  Cardiovascular:     Rate and Rhythm: Bradycardia present. Rhythm irregular.  Pulmonary:     Effort: Pulmonary effort is normal.     Breath sounds: No stridor.  Abdominal:     General: Abdomen is flat. There is no distension.      Palpations: Abdomen is soft.  Musculoskeletal:        General: No deformity or signs of injury.     Right lower leg: Edema (mild) present.     Left lower leg: Edema (mild) present.  Skin:    General: Skin is warm and dry.  Neurological:     Mental Status: He is alert. Mental status is at baseline.     Motor: Weakness (flacid right upper and lower, able to move left  upper and lower) present.  Psychiatric:        Mood and Affect: Mood normal.        Behavior: Behavior normal.     ED Results / Procedures / Treatments   Labs (all labs ordered are listed, but only abnormal results are displayed) Labs Reviewed  LACTIC ACID, PLASMA - Abnormal; Notable for the following components:      Result Value   Lactic Acid, Venous 2.7 (*)    All other components within normal limits  COMPREHENSIVE METABOLIC PANEL - Abnormal; Notable for the following components:   Sodium 134 (*)    Potassium 6.1 (*)    Chloride 92 (*)    Glucose, Bld 144 (*)    Creatinine, Ser 1.25 (*)    Calcium 8.5 (*)    Albumin 3.4 (*)    AST 136 (*)    ALT 124 (*)    All other components within normal limits  CBC WITH DIFFERENTIAL/PLATELET - Abnormal; Notable for the following components:   WBC 12.3 (*)    nRBC 0.3 (*)    Neutro Abs 9.8 (*)    Monocytes Absolute 1.3 (*)    Abs Immature Granulocytes 0.34 (*)    All other components within normal limits  PROTIME-INR - Abnormal; Notable for the following components:   Prothrombin Time 36.7 (*)    INR 3.9 (*)    All other components within normal limits  LIPASE, BLOOD - Abnormal; Notable for the following components:   Lipase 57 (*)    All other components within normal limits  BRAIN NATRIURETIC PEPTIDE - Abnormal; Notable for the following components:   B Natriuretic Peptide 670.0 (*)    All other components within normal limits  BLOOD GAS, ARTERIAL - Abnormal;  Notable for the following components:   pH, Arterial 7.217 (*)    pCO2 arterial 80.8 (*)    pO2,  Arterial 124 (*)    Bicarbonate 31.7 (*)    All other components within normal limits  I-STAT CHEM 8, ED - Abnormal; Notable for the following components:   Sodium 132 (*)    Potassium 5.8 (*)    Chloride 93 (*)    Creatinine, Ser 1.30 (*)    Glucose, Bld 138 (*)    Calcium, Ion 1.12 (*)    All other components within normal limits  TROPONIN I (HIGH SENSITIVITY) - Abnormal; Notable for the following components:   Troponin I (High Sensitivity) 34 (*)    All other components within normal limits  TROPONIN I (HIGH SENSITIVITY) - Abnormal; Notable for the following components:   Troponin I (High Sensitivity) 33 (*)    All other components within normal limits  SARS CORONAVIRUS 2 BY RT PCR (HOSPITAL ORDER, PERFORMED IN [facility])  CULTURE, BLOOD (SINGLE)  URINE CULTURE  LACTIC ACID, PLASMA  APTT  URINALYSIS, ROUTINE W REFLEX MICROSCOPIC  PROCALCITONIN  LACTIC ACID, PLASMA  LACTIC ACID, PLASMA  PROTIME-INR  STREP PNEUMONIAE URINARY ANTIGEN  LEGIONELLA PNEUMOPHILA SEROGP 1 UR AG    EKG EKG Interpretation  Date/Time:  Monday February 20 2020 10:00:24 EDT Ventricular Rate:  44 PR Interval:    QRS Duration: 148 QT Interval:  454 QTC Calculation: 389 R Axis:   90 Text Interpretation: Atrial fibrillation RBBB and LPFB Repol abnrm suggests ischemia, diffuse leads chronically inverted t waves compared to 2020 Confirmed by Cherlynn Perches ([id number]) on 02/20/2020 10:07:05 AM   Radiology CT Head Wo Contrast  Result Date: 02/20/2020 CLINICAL DATA:  Mental status changes. Supratherapeutic on Coumadin. EXAM: CT HEAD WITHOUT CONTRAST TECHNIQUE: Contiguous axial images were obtained from the base of the skull through the vertex without intravenous contrast. COMPARISON:  None. FINDINGS: Brain: Remote left MCA territory infarct is stable. Basal ganglia are intact bilaterally. No acute infarct, hemorrhage, or mass lesion is present. Volume loss is noted. The brainstem and cerebellum are  within normal limits. Vascular: No hyperdense vessel or unexpected calcification. Skull: Calvarium is intact. No focal lytic or blastic lesions are present. No significant extracranial soft tissue lesion is present. Sinuses/Orbits: The paranasal sinuses and mastoid air cells are clear. The globes and orbits are within normal limits. IMPRESSION: 1. No acute intracranial abnormality or significant interval change. 2. Stable remote left MCA territory infarct. Electronically Signed   By: Marin Roberts M.D.   On: 02/20/2020 11:13   CT ANGIO CHEST PE W OR WO CONTRAST  Result Date: 02/20/2020 CLINICAL DATA:  Nausea and vomiting for 3 days. Found unresponsive this morning with hypoxemia. High clinical suspicion for pulmonary embolism. EXAM: CT ANGIOGRAPHY CHEST WITH CONTRAST TECHNIQUE: Multidetector CT imaging of the chest was performed using the standard protocol during bolus administration of intravenous contrast. Multiplanar CT image reconstructions and MIPs were obtained to evaluate the vascular anatomy. CONTRAST:  OMNIPAQUE IOHEXOL 350 MG/ML SOLN COMPARISON:  Chest CTA 09/13/2019.  Radiograph 02/20/2020. FINDINGS: Cardiovascular: The pulmonary arteries are well opacified with contrast to the level of the segmental branches. More peripheral pulmonary arterial evaluation is limited by breathing artifact. No central or segmental pulmonary emboli are identified. There is limited opacification of the aorta which is grossly stable in appearance. The ascending aorta measures up to 4.5 cm, similar to previous study. The heart is mildly enlarged. There is no pericardial  effusion. Mediastinum/Nodes: There are no enlarged mediastinal, hilar or axillary lymph nodes. The thyroid gland, trachea and esophagus demonstrate no significant findings. Lungs/Pleura: New small dependent pleural effusions bilaterally with associated dependent atelectasis in both lungs. In addition, there are patchy ground-glass opacities  within the aerated portions of both lungs, demonstrating an upper lobe predominance. These opacities may reflect multilobar pneumonia or edema. No pneumothorax. Upper abdomen: No acute findings are seen within the visualized upper abdomen. Possible hepatic steatosis and changes of early cirrhosis. Musculoskeletal/Chest wall: There is no chest wall mass or suspicious osseous finding. Review of the MIP images confirms the above findings. IMPRESSION: 1. No evidence of central or segmental pulmonary emboli. More peripheral pulmonary arterial evaluation is limited by breathing artifact. Consider correlation with lower extremity venous Doppler ultrasound 2. New small dependent pleural effusions bilaterally with associated dependent atelectasis in both lungs. Additional patchy ground-glass opacities within the aerated portions of both lungs, as seen on earlier radiographs, may reflect multilobar pneumonia or edema. 3. Stable mild dilatation of the ascending aorta. Electronically Signed   By: Carey Bullocks M.D.   On: 02/20/2020 13:49   DG Chest Port 1 View  Result Date: 02/20/2020 CLINICAL DATA:  64 year old male with history of possible sepsis. EXAM: PORTABLE CHEST 1 VIEW COMPARISON:  Chest x-ray 08/02/2018. FINDINGS: Patchy asymmetrically distributed areas of interstitial prominence and ill-defined airspace consolidation in the lungs bilaterally (left greater than right), most confluent in the perihilar aspect of the left lung, concerning for potential multilobar pneumonia. Possible trace right pleural effusion. No left pleural effusion. Pulmonary vasculature is partially obscured. No pneumothorax. Heart size is mildly enlarged. Upper mediastinal contours are within normal limits. IMPRESSION: 1. The appearance the chest is most compatible with multilobar bilateral pneumonia (left greater than right). 2. Probable trace right pleural effusion. 3. Mild cardiomegaly. Electronically Signed   By: Trudie Reed M.D.    On: 02/20/2020 10:50    Procedures .Critical Care Performed by: Sabino Donovan, MD Authorized by: Sabino Donovan, MD   Critical care provider statement:    Critical care time (minutes):  45   Critical care was necessary to treat or prevent imminent or life-threatening deterioration of the following conditions:  Respiratory failure, circulatory failure and metabolic crisis   Critical care was time spent personally by me on the following activities:  Discussions with consultants, evaluation of patient's response to treatment, examination of patient, ordering and performing treatments and interventions, ordering and review of laboratory studies, ordering and review of radiographic studies, pulse oximetry, re-evaluation of patient's condition, obtaining history from patient or surrogate, review of old charts and blood draw for specimens   (including critical care time)  Medications Ordered in ED Medications  ceFEPIme (MAXIPIME) 2 g in sodium chloride 0.9 % 100 mL IVPB (0 g Intravenous Stopped 02/20/20 1420)  docusate sodium (COLACE) capsule 100 mg (has no administration in time range)  polyethylene glycol (MIRALAX / GLYCOLAX) packet 17 g (has no administration in time range)  ipratropium-albuterol (DUONEB) 0.5-2.5 (3) MG/3ML nebulizer solution 3 mL (3 mLs Nebulization Given 02/20/20 1505)  furosemide (LASIX) injection 40 mg (40 mg Intravenous Given 02/20/20 1100)  vancomycin (VANCOREADY) IVPB 2000 mg/400 mL (2,000 mg Intravenous New Bag/Given 02/20/20 1206)  calcium gluconate 1 g/ 50 mL sodium chloride IVPB (0 g Intravenous Stopped 02/20/20 1420)  insulin aspart (novoLOG) injection 10 Units (10 Units Intravenous Given 02/20/20 1308)  dextrose 50 % solution 50 mL (50 mLs Intravenous Given 02/20/20 1305)  iohexol (OMNIPAQUE) 350  MG/ML injection 100 mL (100 mLs Intravenous Contrast Given 02/20/20 1317)    ED Course  I have reviewed the triage vital signs and the nursing notes.  Pertinent labs & imaging  results that were available during my care of the patient were reviewed by me and considered in my medical decision making (see chart for details).    MDM Rules/Calculators/A&P                          Comes to us from nursing home status post hypoxia to 49% placed on a simple mask, with rapid correction, somnolent this morning.  History of nausea vomiting yesterday.  Abdomen is soft today mild pitting edema in the lower extremities, difficult to assess breath sounds as the patient is a very large body wall and obese body habitus.  Warm extremities, intact pulses throughout.  No focal heart sounds but a slow irregular rate.  Patient is able answer questions and tell me he is not having trouble breathing no not having chest pain no headache, no recent illness and vomiting.  We will get screening laboratory studies chest x-ray and Covid test.  Blood pressure is stable, concerns with the recent history of vomiting and nausea and poor p.o. intake that he may be fluid down however certain aspects of exam point to volume overload.  Swelling in his legs decreased breath sounds and low O2 sats while supine.  I spoke to the family he is DNR, they would not want intubation or chest compressions if his status were to deteriorate.  ECG reviewed by myself shows atrial fibrillation at a bradycardic rate inverted T waves seen previously on EKG from 2020.  No ST elevations and lives at an irregular rhythm.  Patient's troponin is mildly elevated and a repeat is unchanged.  His lactic acidosis was elevated but then resolved with repeat.  I do feel that he is markedly volume overloaded and diuresis was helpful.  He did get calcium insulin and glucose secondary to the hyperkalemia, though he did not have EKG changes other than bradycardia.  A VBG was ordered based on recommendation of the hospitalist team if they were consulted for admission and it showed elevation in CO2 and acidosis likely respiratory secondary body habitus  possibly underlying obstructive sleep breathing disorder.  ICU was consulted for possible need of BiPAP and higher level of care and they agree they will likely need to ventilate him somehow this afternoon or evening.  But no new changes otherwise.  He is given antibiotics and cultures are sent.  The patient will be admitted to the hospitalist.  For the remainder this patient's care please see inpatient team notes.  I will intervene as needed while the patient remains in the emergency department.  CRITICAL CARE Performed by: Sabino DonovanEric C Lyrik Buresh   Total critical care time: 45 minutes  Critical care time was exclusive of separately billable procedures and treating other patients.  Critical care was necessary to treat or prevent imminent or life-threatening deterioration.  Critical care was time spent personally by me on the following activities: development of treatment plan with patient and/or surrogate as well as nursing, discussions with consultants, evaluation of patient's response to treatment, examination of patient, obtaining history from patient or surrogate, ordering and performing treatments and interventions, ordering and review of laboratory studies, ordering and review of radiographic studies, pulse oximetry and re-evaluation of patient's condition.    ED Disposition    ED Disposition Condition  Comment   Admit  Hospital Area: [facility] [[id number]]  Level of Care: ICU [6]  May admit patient to [facility] or [facility] if equivalent level of care is available:: Yes  Covid Evaluation: Confirmed COVID Negative  Diagnosis: Hypercapnic respiratory failure [facility]) [[id number]]  Admitting Physician: Martina Sinner [[id number]]  Attending Physician: Martina Sinner [[id number]]  Estimated length of stay: 3 - 4 days  Certification:: I certify this patient will need inpatient services for at least 2 midnights        Final Clinical Impression(s) / ED Diagnoses Final  diagnoses:  HCAP (healthcare-associated pneumonia)  AKI (acute kidney injury) [facility])    Rx / DC Orders ED Discharge Orders    None       Sabino Donovan, MD 02/20/20 1521    Sabino Donovan, MD 02/21/20 [phone]

## 2020-02-21 ENCOUNTER — Telehealth: Payer: Self-pay | Admitting: Pulmonary Disease

## 2020-02-21 DIAGNOSIS — J9622 Acute and chronic respiratory failure with hypercapnia: Secondary | ICD-10-CM

## 2020-02-21 DIAGNOSIS — G4733 Obstructive sleep apnea (adult) (pediatric): Secondary | ICD-10-CM

## 2020-02-21 LAB — BASIC METABOLIC PANEL
Anion gap: 8 (ref 5–15)
BUN: 22 mg/dL (ref 8–23)
CO2: 34 mmol/L — ABNORMAL HIGH (ref 22–32)
Calcium: 8.6 mg/dL — ABNORMAL LOW (ref 8.9–10.3)
Chloride: 93 mmol/L — ABNORMAL LOW (ref 98–111)
Creatinine, Ser: 0.81 mg/dL (ref 0.61–1.24)
GFR calc Af Amer: >60 mL/min (ref >60)
GFR calc non Af Amer: >60 mL/min (ref >60)
Glucose, Bld: 98 mg/dL (ref 70–99)
Potassium: 4.5 mmol/L (ref 3.5–5.1)
Sodium: 135 mmol/L (ref 135–145)

## 2020-02-21 LAB — PHOSPHORUS: Phosphorus: 3.7 mg/dL (ref 2.5–4.6)

## 2020-02-21 LAB — CBC
HCT: 42.5 % (ref 39.0–52.0)
Hemoglobin: 13.8 g/dL (ref 13.0–17.0)
MCH: 31.1 pg (ref 26.0–34.0)
MCHC: 32.5 g/dL (ref 30.0–36.0)
MCV: 95.7 fL (ref 80.0–100.0)
Platelets: 164 10*3/uL (ref 150–400)
RBC: 4.44 MIL/uL (ref 4.22–5.81)
RDW: 13.4 % (ref 11.5–15.5)
WBC: 10.7 10*3/uL — ABNORMAL HIGH (ref 4.0–10.5)
nRBC: 0 % (ref 0.0–0.2)

## 2020-02-21 LAB — MAGNESIUM: Magnesium: 2.1 mg/dL (ref 1.7–2.4)

## 2020-02-21 LAB — PROTIME-INR
INR: 3.6 — ABNORMAL HIGH (ref 0.8–1.2)
Prothrombin Time: 34.4 seconds — ABNORMAL HIGH (ref 11.4–15.2)

## 2020-02-21 LAB — LEGIONELLA PNEUMOPHILA SEROGP 1 UR AG: L. pneumophila Serogp 1 Ur Ag: NEGATIVE

## 2020-02-21 LAB — PROCALCITONIN: Procalcitonin: 0.13 ng/mL

## 2020-02-21 MED ORDER — EZETIMIBE 10 MG PO TABS
10.0000 mg | ORAL_TABLET | Freq: Every day | ORAL | Status: DC
  Administered 2020-02-21 – 2020-02-28 (×8): 10 mg via ORAL
  Filled 2020-02-21 (×9): qty 1

## 2020-02-21 MED ORDER — OMEGA-3 FATTY ACIDS 1000 MG PO CAPS: 1.0000 g | ORAL_CAPSULE | Freq: Two times a day (BID) | ORAL | Status: DC

## 2020-02-21 MED ORDER — PHENYTOIN SODIUM EXTENDED 100 MG PO CAPS
200.0000 mg | ORAL_CAPSULE | Freq: Two times a day (BID) | ORAL | Status: DC
  Administered 2020-02-21 – 2020-02-28 (×15): 200 mg via ORAL
  Filled 2020-02-21 (×14): qty 2

## 2020-02-21 MED ORDER — ROSUVASTATIN CALCIUM 20 MG PO TABS
40.0000 mg | ORAL_TABLET | Freq: Every day | ORAL | Status: DC
  Administered 2020-02-21 – 2020-02-27 (×7): 40 mg via ORAL
  Filled 2020-02-21 (×8): qty 2

## 2020-02-21 MED ORDER — OMEGA-3-ACID ETHYL ESTERS 1 G PO CAPS
1.0000 g | ORAL_CAPSULE | Freq: Two times a day (BID) | ORAL | Status: DC
  Administered 2020-02-21 – 2020-02-28 (×15): 1 g via ORAL
  Filled 2020-02-21 (×18): qty 1

## 2020-02-21 MED ORDER — FUROSEMIDE 10 MG/ML IJ SOLN
40.0000 mg | Freq: Once | INTRAMUSCULAR | Status: AC
  Administered 2020-02-21: 40 mg via INTRAVENOUS
  Filled 2020-02-21: qty 4

## 2020-02-21 MED ORDER — CYCLOSPORINE 0.05 % OP EMUL
1.0000 [drp] | Freq: Two times a day (BID) | OPHTHALMIC | Status: DC
  Administered 2020-02-21 – 2020-02-28 (×15): 1 [drp] via OPHTHALMIC
  Filled 2020-02-21 (×17): qty 1

## 2020-02-21 MED ORDER — IPRATROPIUM-ALBUTEROL 0.5-2.5 (3) MG/3ML IN SOLN
3.0000 mL | Freq: Four times a day (QID) | RESPIRATORY_TRACT | Status: DC
  Administered 2020-02-22 (×2): 3 mL via RESPIRATORY_TRACT
  Filled 2020-02-21 (×3): qty 3

## 2020-02-21 NOTE — H&P (Signed)
Please see note listed as 'consult note' on 02/20/20 which should be listed as H&P.   Melody Comas, MD [facility] Office: [phone]   See Amion for Pager Details

## 2020-02-21 NOTE — Telephone Encounter (Signed)
Pt is currently still admitted at the hospital. I have placed the order for the split-night sleep study.  Based on when the sleep study is scheduled for, we need to get pt scheduled for a consult with one of our sleep MDs.  Routing this to PCCs.

## 2020-02-21 NOTE — Progress Notes (Addendum)
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  N/V x3 days, O2 sats 49% on EMS evaluation.  Work up consistent with AKI, hyperkalemia, and acute hypercarbic respiratory failure  CT head did not show acute pathology. CTA chest did not indicate pulmonary emboli. Small dependent bilateral pleural effusions. Patchy ground glass areas, scattered. EKG shows atrial fibrillation or flutter, inverted T waves in leads V1-V5 similar to prior EKG 07/2019.  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 Normal LV and RV function. No significant valve abnormalities. RVSP 29.67mmHg. CT Head 9/13 >> no acute abnormalities CTA Chest 9/13 >> No PE. Respiratory Motion artifact. Small bilateral pleural effusions and dependent atelectasis.   Micro Data:  COVID 9/13 >> negative  BCx2 9/13 >> Urine 9/13 >>  Antimicrobials:  Cefepime 9/13 >> Vancomycin 9/13 >>   Interim history/subjective:  Off bipap, alert / responsive per RN Pt asking for food Afebrile  No acute events over night  Objective   Blood pressure 124/68, pulse (!) 103, temperature 98 F (36.7 C), temperature source Oral, resp. rate 13, height 6\' 1"  (1.854 m), weight 135 kg, SpO2 99 %.    FiO2 (%):   [40 %-99 %] 99 %   Intake/Output Summary (Last 24 hours) at 02/21/2020 1002 Last data filed at 02/20/2020 2300 Gross per 24 hour  Intake 550 ml  Output 1170 ml  Net -620 ml   Filed Weights   02/20/20 0948 02/21/20 0500  Weight: 135 kg 135 kg    Examination: General: chronically ill appearing adult male lying in bed in NAD HEENT: MM pink/moist, fair dentition, [place] O2 Neuro: AAOx4, speech clear, flaccid paralysis on right side  CV: s1s2 irr irr, no m/r/g PULM: non-labored on [place] O2, lungs bilaterally distant but clear anterior GI: soft, bsx4 active  Extremities: warm/dry, 1-2+ BLE pitting edema  Skin: no rashes or lesions  Resolved Hospital Problem list     Assessment & Plan:  James Villegas is a 64 year old male with history of CVA with residual right sided hemiparesis, PFO, 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 He is being admitted to the ICU for acute hypercapnic respiratory failure.   Acute Hypoxemic and Hypercapnic Respiratory Failure Rule Out HAP Likely in setting of obesity, underlying sleep apnea and pneumonia.  CTA negative for PE & supratherapeutic INR so less likely thromboembolic disease  -QHS BiPAP and PRN daytime sleep  -duoneb  -will attempt to arrange for bipap at facility, may require sleep study first > note CO2 on BMP 34 which would imply some degree of chronic hypercarbia  -lasix 40 mg IV x1 -continue cefepime / vanco for now, follow up CXR  in am for review and narrow abx -assess procalcitonin -advance diet as tolerated, SLP evaluation given hx of CVA   Acute Metabolic Encephalopathy In setting of acute hypercarbia, CT head negative, UDS negative.  TSH normal 07/2019.  B12 wnl, phenytoin level normal -BiPAP as above  -promote sleep / wake cycle -limit sedating medications -hold home gabapentin, hydroxyzine, trazodone, effexor for now.  May need to revisit use if  he is not going to wear bipap  Acute Kidney Injury Suspect pre-renal etiology given poor oral intake and vomiting vs hypoxic injury from respiratory failure -Trend BMP / urinary output -Replace electrolytes as indicated -Avoid nephrotoxic agents, ensure adequate renal perfusion  Hypercoagulable State -follow INR, hold coumadin until drifted back to therapeutic  -coumadin per pharmacy   Seizure Disorder Phenytoin level low limit therapeutic  -resume home phenytoin   HTN, HLD, Chronic Diastolic HF, Chronic AFib/Flutter Hx PFO - no plans for closure per last Cards note Hx CVA -resume zetia, crestor -hold home metoprolol for now -diuresis as above -hold home spironolactone, demadex  Best practice:  Diet: NPO Pain/Anxiety/Delirium protocol (if indicated): n/a VAP protocol (if indicated): n/a DVT prophylaxis: coumadin GI prophylaxis: n/a Glucose control: n/a, SSI if needed Mobility: bed rest Code Status: DNR/DNI Family Communication: Patient updated on plan of care 9/14  Disposition: to floor, to [facility] as of 9/15 0700.   Labs   CBC: Recent Labs  Lab 02/20/20 1002 02/20/20 1018 02/21/20 0253  WBC 12.3*  --  10.7*  NEUTROABS 9.8*  --   --   HGB 14.6 15.3 13.8  HCT 46.0 45.0 42.5  MCV 97.5  --  95.7  PLT 239  --  164    Basic Metabolic Panel: Recent Labs  Lab 02/20/20 1002 02/20/20 1018 02/21/20 0253  NA 134* 132* 135  K 6.1* 5.8* 4.5  CL 92* 93* 93*  CO2 29  --  34*  GLUCOSE 144* 138* 98  BUN 22 23 22   CREATININE 1.25* 1.30* 0.81  CALCIUM 8.5*  --  8.6*  MG  --   --  2.1  PHOS  --   --  3.7   GFR: Estimated Creatinine Clearance: 138 mL/min (by C-G formula based on SCr of 0.81 mg/dL). Recent Labs  Lab 02/20/20 1002 02/20/20 1248 02/20/20 1515 02/20/20 1800 02/21/20 0253  PROCALCITON  --   --  0.15  --   --   WBC 12.3*  --   --   --  10.7*  LATICACIDVEN 2.7* 1.4 1.8 1.8  --     Liver Function Tests: Recent Labs  Lab 02/20/20 1002  AST 136*    ALT 124*  ALKPHOS 98  BILITOT 0.4  PROT 6.7  ALBUMIN 3.4*   Recent Labs  Lab 02/20/20 1002  LIPASE 57*   Recent Labs  Lab 02/20/20 1721  AMMONIA 35    ABG    Component Value Date/Time   PHART 7.357 02/20/2020 1700   PCO2ART 56.5 (H) 02/20/2020 1700   PO2ART 79.8 (L) 02/20/2020 1700   HCO3 31.1 (H) 02/20/2020 1700   TCO2 30 02/20/2020 1018   O2SAT 95.6 02/20/2020 1700     Coagulation Profile: Recent Labs  Lab 02/20/20 1002 02/20/20 1515 02/21/20 0253  INR 3.9* 4.3* 3.6*    Cardiac Enzymes: No results for input(s): CKTOTAL, CKMB, CKMBINDEX, TROPONINI in the last 168 hours.  HbA1C: Hgb A1c MFr Bld  Date/Time Value Ref Range Status  12/16/2010 05:50 AM 5.4 <5.7 % Final  Comment:    (NOTE)                                                                       According to the ADA Clinical Practice Recommendations for 2011, when HbA1c is used as a screening test:  >=6.5%   Diagnostic of Diabetes Mellitus           (if abnormal result is confirmed) 5.7-6.4%   Increased risk of developing Diabetes Mellitus References:Diagnosis and Classification of Diabetes Mellitus,Diabetes Care,2011,34(Suppl 1):S62-S69 and Standards of Medical Care in         Diabetes - 2011,Diabetes Care,2011,34 (Suppl 1):S11-S61.  07/15/2008 01:20 PM  4.6 - 6.1 % Final   5.0 (NOTE)   The ADA recommends the following therapeutic goal for glycemic   control related to Hgb A1C measurement:   Goal of Therapy:   < 7.0% Hgb A1C   Reference: American Diabetes Association: Clinical Practice   Recommendations 2008, Diabetes Care,  2008, 31:(Suppl 1).    CBG: No results for input(s): GLUCAP in the last 168 hours.    Critical care time: n/a     Canary Brim, MSN, NP-C [facility] 02/21/2020, 10:02 AM   Please see [email or web address] for pager details.

## 2020-02-21 NOTE — Evaluation (Signed)
Clinical/Bedside Swallow Evaluation Patient Details  Name: James Villegas MRN: [id number] Date of Birth: Jul 07, 1955  Today's Date: 02/21/2020 Time: SLP Start Time (ACUTE ONLY): 1525 SLP Stop Time (ACUTE ONLY): 1549 SLP Time Calculation (min) (ACUTE ONLY): 24 min  Past Medical History:  Past Medical History:  Diagnosis Date  . Acute on chronic congestive heart failure (HCC)   . Anemia, secondary    SECONDARY TO ACUTE BLOOD LOSS  . Anxiety disorder   . Bloody stool 08/29/2011   intermittent along with constipation.   . 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 Depression   . 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 Dysphagia   . Hyperlipidemia   . Hypertension   . Lupus anticoagulant disorder (HCC) 1990  . Morbid obesity (HCC)   . PFO (patent foramen ovale)    TEE 2/10: EF 60%, trivial AI, mild Ao root dilatation, mod PFO with R-L shunting, atrial septal aneurysm;   echo 7/12: EF 65-70%, grade 1 diast dysfxn, mild MR, LVOT showed severe obstruction  . Protein C deficiency (HCC)   . Protein S deficiency (HCC)   . Rectus sheath hematoma 7/12   required reversal of anticoagulation and c/b DVT req. IVC filter  . Seizure disorder [facility])    Past Surgical History:  Past Surgical History:  Procedure Laterality Date  . dental extraction     multiple  . vena cavogram  12/2010   INFERIOR   HPI:  Pt is a 65 yo LEFT handed male with PMH significant for Left MCA CVA with dysphasia, aphasia, right hemiparesis, seizure d/o, PFO with hypercoagulatin due to Protein C and S deficiency and lupus, DVT and prior smoker.  Pt has vomiting for 3 days prior to admit and was found unresponsive who resides at [facility] - found unreponsive with low oxygen saturation.  Imaging showed New small dependent pleural effusions bilaterally with associated dependent atelectasis in both lungs. Additional  patchy ground-glass opacities within the aerated portions of both lungs, as seen on earlier radiographs, may reflect multilobar pneumonia or edema.  Swallow evaluation ordered.  Pt reports to this SLP that he tested positive for COVID last year and received ABX but also infusion?   Assessment / Plan / Recommendation Clinical Impression  Pt presents with functional oropharyngeal swallow ability with consumption observed. Baseline CN deficits from prior Left MCA CVA - pt is left handed.  Pt noted to have some word dysfluency ? due to CVA and appeared with halting breathing at times during session - ? source.   He passed 3 ounce [place] water test easily.  Sniffling noted during testing, which he atributes to post nasal drainage, denies due to refluxing, allergies.  He also was observed to frequently lay the [facility] FLAT before and after intake -and SLP advised against this behavior due to h/o GERD and vomiting prior to admission.  After laying flat, pt began coughing and raised HOB. Expectorated minimal secretions and denied refluxing.  Recommend regular/thin diet, pt educated and no SLP follow up needed. SLP Visit Diagnosis: Dysphagia, unspecified (R13.10)    Aspiration Risk  No limitations    Diet Recommendation Regular;Thin liquid   Liquid Administration via: Straw;Cup Medication Administration: Whole meds with puree Supervision: Patient able to self feed Compensations: Slow rate;Small sips/bites Postural Changes: Remain upright for at least 30 minutes after po intake  Other  Recommendations Oral Care Recommendations: Oral care BID   Follow up Recommendations n/a  Frequency and Duration       Prognosis   n/a     Swallow Study   General Date of Onset: 02/20/20 HPI: Pt is a 63 yo LEFT handed male with PMH significant for Left MCA CVA with dysphasia, aphasia, right hemiparesis, seizure d/o, PFO with hypercoagulatin due to Protein C and S deficiency and lupus, DVT and prior smoker.  Pt has  vomiting for 3 days prior to admit and was found unresponsive who resides at [facility] - found unreponsive with low oxygen saturation.  Imaging showed New small dependent pleural effusions bilaterally with associated dependent atelectasis in both lungs. Additional patchy ground-glass opacities within the aerated portions of both lungs, as seen on earlier radiographs, may reflect multilobar pneumonia or edema.  Swallow evaluation ordered.  Pt reports to this SLP that he tested positive for COVID last year and received ABX but also infusion? Type of Study: Bedside Swallow Evaluation Previous Swallow Assessment: BSE 07/2018 when admitted with respiratory difficulties- recommended regular/thin diet - pt has h/o C5-C6 cervical osteophytes Diet Prior to this Study: Dysphagia 3 (soft);Thin liquids Temperature Spikes Noted: No Respiratory Status: Nasal cannula History of Recent Intubation: No Behavior/Cognition: Alert;Cooperative;Pleasant mood Oral Cavity Assessment: Within Functional Limits Oral Care Completed by SLP: No Oral Cavity - Dentition: Adequate natural dentition Vision: Functional for self-feeding (with left hand) Self-Feeding Abilities: Able to feed self Patient Positioning: Upright in bed Baseline Vocal Quality: Normal Volitional Cough: Strong Volitional Swallow: Able to elicit    Oral/Motor/Sensory Function Overall Oral Motor/Sensory Function: Other (comment) (some baseline right sided weakness from prior cva)   Ice Chips Ice chips: Not tested   Thin Liquid Thin Liquid: Within functional limits Presentation: Straw;Self Fed;Cup Other Comments: 3 ounce yale water test    Nectar Thick Nectar Thick Liquid: Not tested   Honey Thick Honey Thick Liquid: Not tested   Puree Puree: Within functional limits Presentation: Self Fed;Spoon   Solid     Solid: Within functional limits Presentation: Self Fed Other Comments: minimal residual on left labial region, cues to clear, no intraoral  retention      [name] 02/21/2020,4:20 PM   Rolena Infante, MS [facility] SLP Acute Rehab Services Office [phone]

## 2020-02-21 NOTE — Telephone Encounter (Signed)
Mr. Mecham will need an outpatient sleep study completed at the [facility].  He has limited mobility due to prior CVA (right sided hemiparesis) and will need follow up with a sleep MD in the clinic after the study.  Can you please arrange for follow up?  Thank you!  Please call if you have questions.    Canary Brim, MSN, NP-C [place] [facility] 02/21/2020, 4:02 PM   Please see [email or web address] for pager details.

## 2020-02-21 NOTE — Progress Notes (Signed)
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 Fenofibrate Other (See Comments)    Per [facility]    Patient Measurements: Height: 6\' 1"  (185.4 cm) Weight: 135 kg (297 lb 9.9 oz) IBW/kg (Calculated) : 79.9  Vital Signs: Temp: 98.1 F (36.7 C) (09/14 1200) Temp Source: Oral (09/14 1200) BP: 111/69 (09/14 1100) Pulse Rate: 102 (09/14 1100)  Labs: Recent Labs    02/20/20 1002 02/20/20 1002 02/20/20 1018 02/20/20 1247 02/20/20 1515 02/21/20 0253  HGB 14.6   < > 15.3  --   --  13.8  HCT 46.0  --  45.0  --   --  42.5  PLT 239  --   --   --   --  164  APTT 36  --   --   --   --   --   LABPROT 36.7*  --   --   --  39.8* 34.4*  INR 3.9*  --   --   --  4.3* 3.6*  CREATININE 1.25*  --  1.30*  --   --  0.81  TROPONINIHS 34*  --   --  33*  --   --    < > = values in this interval not displayed.    Estimated Creatinine Clearance: 138 mL/min (by C-G formula based on SCr of 0.81 mg/dL).   Medical History: Past Medical History:  Diagnosis Date  . Acute on chronic congestive heart failure (HCC)   . Anemia, secondary    SECONDARY TO ACUTE BLOOD LOSS  . Anxiety disorder   . Bloody stool 08/29/2011   intermittent along with constipation.   . 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 Depression   . 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 Dysphagia   . Hyperlipidemia   . Hypertension   . Lupus anticoagulant disorder (HCC) 1990  . Morbid obesity (HCC)   . PFO (patent foramen ovale)    TEE 2/10: EF 60%, trivial AI, mild Ao root dilatation, mod PFO with R-L shunting, atrial septal aneurysm;   echo 7/12: EF 65-70%, grade 1 diast dysfxn, mild MR, LVOT showed severe obstruction  . Protein C deficiency (HCC)   . Protein S deficiency  (HCC)   . Rectus sheath hematoma 7/12   required reversal of anticoagulation and c/b DVT req. IVC filter  . Seizure disorder (HCC)     Medications:  Scheduled:  . chlorhexidine  15 mL Mouth Rinse BID  . chlorhexidine  15 mL Mouth Rinse BID  . Chlorhexidine Gluconate Cloth  6 each Topical Daily  . cycloSPORINE  1 drop Both Eyes BID  . ezetimibe  10 mg Oral Daily  . ipratropium-albuterol  3 mL Nebulization Q6H  . mouth rinse  15 mL Mouth Rinse q12n4p  . mouth rinse  15 mL Mouth Rinse q12n4p  . omega-3 acid ethyl esters  1 g Oral BID  . phenytoin  200 mg Oral BID  . rosuvastatin  40 mg Oral QHS   Infusions:  . sodium chloride 10 mL/hr at 02/21/20 1000  . ceFEPime (MAXIPIME) IV Stopped (02/21/20 02/23/20)    Assessment: 55 yoM admitted on 9/13 with AMS, N/V, respiratory failure.  PMH includes hypercoagulable state on chronic warfarin anticoagulation.  Admission INR was  supratherapeutic and warfarin was held.  Pharmacy is now consulted to resume warfarin when INR normalizes.  PTA warfarin 3.5 mg daily.  LD on 9/12 at 1800. Admission INR 4.3  Today, 02/21/2020: INR remains elevated but decreased to 3.6 CBC: Hgb and Plt wnl No bleeding or complications reported Diet: NPO, advanced to soft diet. No major drug-drug interactions noted.  Broad spectrum antibiotics, Cefepime, may increase INR.  Goal of Therapy:  INR 2-3 Monitor platelets by anticoagulation protocol: Yes   Plan:  Continue to hold warfarin today. Daily PT/INR. Monitor for signs and symptoms of bleeding.   Lynann Beaver PharmD, BCPS Clinical Pharmacist [facility] main pharmacy [phone] 02/21/2020 1:37 PM

## 2020-02-21 NOTE — Progress Notes (Signed)
PCCM will arrange for outpatient overnight sleep study at the sleep center with follow up evaluation after in office (due to patients limited mobility).    Canary Brim, MSN, NP-C [place] [facility] 02/21/2020, 4:00 PM   Please see [email or web address] for pager details.

## 2020-02-22 DIAGNOSIS — J189 Pneumonia, unspecified organism: Principal | ICD-10-CM

## 2020-02-22 DIAGNOSIS — N179 Acute kidney failure, unspecified: Secondary | ICD-10-CM

## 2020-02-22 DIAGNOSIS — I5032 Chronic diastolic (congestive) heart failure: Secondary | ICD-10-CM

## 2020-02-22 DIAGNOSIS — G40909 Epilepsy, unspecified, not intractable, without status epilepticus: Secondary | ICD-10-CM

## 2020-02-22 DIAGNOSIS — I482 Chronic atrial fibrillation, unspecified: Secondary | ICD-10-CM

## 2020-02-22 DIAGNOSIS — L8990 Pressure ulcer of unspecified site, unspecified stage: Secondary | ICD-10-CM | POA: Insufficient documentation

## 2020-02-22 DIAGNOSIS — D6859 Other primary thrombophilia: Secondary | ICD-10-CM

## 2020-02-22 DIAGNOSIS — Z8673 Personal history of transient ischemic attack (TIA), and cerebral infarction without residual deficits: Secondary | ICD-10-CM

## 2020-02-22 DIAGNOSIS — E785 Hyperlipidemia, unspecified: Secondary | ICD-10-CM

## 2020-02-22 DIAGNOSIS — I10 Essential (primary) hypertension: Secondary | ICD-10-CM

## 2020-02-22 DIAGNOSIS — I6930 Unspecified sequelae of cerebral infarction: Secondary | ICD-10-CM

## 2020-02-22 DIAGNOSIS — G9341 Metabolic encephalopathy: Secondary | ICD-10-CM

## 2020-02-22 LAB — CBC
HCT: 43.9 % (ref 39.0–52.0)
Hemoglobin: 13.8 g/dL (ref 13.0–17.0)
MCH: 30.7 pg (ref 26.0–34.0)
MCHC: 31.4 g/dL (ref 30.0–36.0)
MCV: 97.6 fL (ref 80.0–100.0)
Platelets: 151 10*3/uL (ref 150–400)
RBC: 4.5 MIL/uL (ref 4.22–5.81)
RDW: 13.1 % (ref 11.5–15.5)
WBC: 9.4 10*3/uL (ref 4.0–10.5)
nRBC: 0 % (ref 0.0–0.2)

## 2020-02-22 LAB — BASIC METABOLIC PANEL
Anion gap: 11 (ref 5–15)
BUN: 13 mg/dL (ref 8–23)
CO2: 33 mmol/L — ABNORMAL HIGH (ref 22–32)
Calcium: 8.4 mg/dL — ABNORMAL LOW (ref 8.9–10.3)
Chloride: 94 mmol/L — ABNORMAL LOW (ref 98–111)
Creatinine, Ser: 0.6 mg/dL — ABNORMAL LOW (ref 0.61–1.24)
GFR calc Af Amer: >60 mL/min (ref >60)
GFR calc non Af Amer: >60 mL/min (ref >60)
Glucose, Bld: 88 mg/dL (ref 70–99)
Potassium: 3.8 mmol/L (ref 3.5–5.1)
Sodium: 138 mmol/L (ref 135–145)

## 2020-02-22 LAB — PROCALCITONIN: Procalcitonin: <0.1 ng/mL

## 2020-02-22 LAB — URINE CULTURE: Culture: 60000 — AB

## 2020-02-22 LAB — PROTIME-INR
INR: 2.1 — ABNORMAL HIGH (ref 0.8–1.2)
Prothrombin Time: 22.8 seconds — ABNORMAL HIGH (ref 11.4–15.2)

## 2020-02-22 MED ORDER — FUROSEMIDE 10 MG/ML IJ SOLN
40.0000 mg | Freq: Once | INTRAMUSCULAR | Status: AC
  Administered 2020-02-22: 40 mg via INTRAVENOUS
  Filled 2020-02-22: qty 4

## 2020-02-22 MED ORDER — HYDROXYZINE HCL 10 MG PO TABS
10.0000 mg | ORAL_TABLET | Freq: Three times a day (TID) | ORAL | Status: DC | PRN
  Administered 2020-02-25: 10 mg via ORAL
  Filled 2020-02-22 (×2): qty 1

## 2020-02-22 MED ORDER — IPRATROPIUM-ALBUTEROL 0.5-2.5 (3) MG/3ML IN SOLN
3.0000 mL | Freq: Two times a day (BID) | RESPIRATORY_TRACT | Status: DC
  Administered 2020-02-23 – 2020-02-24 (×4): 3 mL via RESPIRATORY_TRACT
  Filled 2020-02-22 (×5): qty 3

## 2020-02-22 MED ORDER — SODIUM CHLORIDE 0.9 % IV SOLN
2.0000 g | INTRAVENOUS | Status: AC
  Administered 2020-02-22 – 2020-02-24 (×3): 2 g via INTRAVENOUS
  Filled 2020-02-22 (×3): qty 2

## 2020-02-22 MED ORDER — WARFARIN SODIUM 3 MG PO TABS
3.0000 mg | ORAL_TABLET | Freq: Once | ORAL | Status: AC
  Administered 2020-02-22: 3 mg via ORAL
  Filled 2020-02-22: qty 1

## 2020-02-22 MED ORDER — WARFARIN - PHARMACIST DOSING INPATIENT: Freq: Every day | Status: DC

## 2020-02-22 MED ORDER — HYDROCERIN EX CREA
1.0000 "application " | TOPICAL_CREAM | Freq: Every day | CUTANEOUS | Status: DC | PRN
  Filled 2020-02-22: qty 113

## 2020-02-22 MED ORDER — METOPROLOL TARTRATE 25 MG PO TABS
12.5000 mg | ORAL_TABLET | Freq: Two times a day (BID) | ORAL | Status: DC
  Administered 2020-02-22 – 2020-02-28 (×13): 12.5 mg via ORAL
  Filled 2020-02-22 (×13): qty 1

## 2020-02-22 MED ORDER — PANTOPRAZOLE SODIUM 40 MG PO TBEC
40.0000 mg | DELAYED_RELEASE_TABLET | Freq: Every day | ORAL | Status: DC
  Administered 2020-02-22 – 2020-02-27 (×6): 40 mg via ORAL
  Filled 2020-02-22 (×6): qty 1

## 2020-02-22 NOTE — NC FL2 (Signed)
[place] MEDICAID FL2 LEVEL OF CARE SCREENING TOOL     IDENTIFICATION  Patient Name: James Villegas Birthdate: August 02, 1955 Sex: male Admission Date (Current Location): 02/20/2020  [facility] and [place] Number:  Producer, television/film/video and Address:  [facility],  [address] [place], [place] [address]      Provider Number: [id number]  Attending Physician Name and Address:  Rodolph Bong, MD  Relative Name and Phone Number:       Current Level of Care: Hospital Recommended Level of Care: Skilled Nursing Facility Prior Approval Number:    Date Approved/Denied:   PASRR Number: [id number]  Discharge Plan: SNF    Current Diagnoses: Patient Active Problem List   Diagnosis Date Noted   Acute respiratory failure (HCC) 02/20/2020   CAP (community acquired pneumonia) 02/20/2020   Obesity, Class III, BMI 40-49.9 (morbid obesity) (HCC) 02/20/2020   Bradycardia 02/20/2020   Hyperkalemia 02/20/2020   Hypercapnic respiratory failure (HCC) 02/20/2020   Acute on chronic congestive heart failure (HCC)    New onset atrial fibrillation (HCC)    Chronic anticoagulation    Respiratory failure (HCC) 08/02/2018   Anemia of other chronic disease 11/23/2013   Pain in joint, ankle and foot 11/14/2013   Other convulsions 10/19/2013   Noninfectious gastroenteritis and colitis 07/29/2013   Left upper quadrant abdominal mass 07/28/2013   Other specified disease of white blood cells 07/16/2013   Ingrowing toenail with infection 05/20/2013   Late effects of cerebrovascular disease 10/15/2012   Pure hypercholesterolemia 10/15/2012   Essential hypertension, benign 10/15/2012   Hereditary and idiopathic peripheral neuropathy 10/15/2012   Protein C deficiency (HCC) 08/29/2011   Protein S deficiency (HCC) 08/29/2011   Cerebral infarction (HCC) 08/29/2011   DVT (deep venous thrombosis) (HCC) 08/29/2011   Bloody stool 08/29/2011   PFO (patent foramen ovale)  02/18/2011    Orientation RESPIRATION BLADDER Height & Weight     Self, Situation, Time, Place  Normal Continent Weight: 135 kg Height:  6\' 1"  (185.4 cm)  BEHAVIORAL SYMPTOMS/MOOD NEUROLOGICAL BOWEL NUTRITION STATUS      Continent Diet (regular)  AMBULATORY STATUS COMMUNICATION OF NEEDS Skin   Total Care Verbally Normal                       Personal Care Assistance Level of Assistance  Bathing, Feeding, Dressing, Total care Bathing Assistance: Maximum assistance Feeding assistance: Maximum assistance Dressing Assistance: Maximum assistance Total Care Assistance: Maximum assistance   Functional Limitations Info  Sight, Hearing, Speech Sight Info: Adequate Hearing Info: Adequate Speech Info: Adequate    SPECIAL CARE FACTORS FREQUENCY  PT (By licensed PT), OT (By licensed OT)     PT Frequency: 5 x weekly OT Frequency: 5 x weekly            Contractures Contractures Info: Not present    Additional Factors Info  Code Status Code Status Info: DNR             Current Medications (02/22/2020):  This is the current hospital active medication list Current Facility-Administered Medications  Medication Dose Route Frequency Provider Last Rate Last Admin   0.9 %  sodium chloride infusion   Intravenous PRN 02/24/2020, MD   Paused at 02/22/20 02/24/20   ceFEPIme (MAXIPIME) 2 g in sodium chloride 0.9 % 100 mL IVPB  2 g Intravenous Q8H 9983, MD   Stopped at 02/22/20 [phone]   chlorhexidine (PERIDEX) 0.12 % solution 15 mL  15 mL Mouth Rinse BID Martina Sinner, MD       chlorhexidine (PERIDEX) 0.12 % solution 15 mL  15 mL Mouth Rinse BID Martina Sinner, MD   15 mL at 02/21/20 2259   Chlorhexidine Gluconate Cloth 2 % PADS 6 each  6 each Topical Daily Martina Sinner, MD   6 each at 02/21/20 1303   cycloSPORINE (RESTASIS) 0.05 % ophthalmic emulsion 1 drop  1 drop Both Eyes BID Ollis, Brandi L, NP   1 drop at 02/21/20 2300   docusate sodium (COLACE)  capsule 100 mg  100 mg Oral BID PRN Martina Sinner, MD       ezetimibe (ZETIA) tablet 10 mg  10 mg Oral Daily Ollis, Brandi L, NP   10 mg at 02/21/20 1448   ipratropium-albuterol (DUONEB) 0.5-2.5 (3) MG/3ML nebulizer solution 3 mL  3 mL Nebulization Q6H WA Melody Comas B, MD   3 mL at 02/22/20 0831   MEDLINE mouth rinse  15 mL Mouth Rinse q12n4p Martina Sinner, MD   15 mL at 02/21/20 1210   MEDLINE mouth rinse  15 mL Mouth Rinse q12n4p Melody Comas B, MD   15 mL at 02/21/20 1504   omega-3 acid ethyl esters (LOVAZA) capsule 1 g  1 g Oral BID Melody Comas B, MD   1 g at 02/22/20 0006   phenytoin (DILANTIN) ER capsule 200 mg  200 mg Oral BID Ollis, Brandi L, NP   200 mg at 02/21/20 2259   polyethylene glycol (MIRALAX / GLYCOLAX) packet 17 g  17 g Oral Daily PRN Martina Sinner, MD       rosuvastatin (CRESTOR) tablet 40 mg  40 mg Oral QHS Ollis, Brandi L, NP   40 mg at 02/21/20 2259     Discharge Medications: Please see discharge summary for a list of discharge medications.  Relevant Imaging Results:  Relevant Lab Results:   Additional Information SS# [id number]  Golda Acre, RN

## 2020-02-22 NOTE — Telephone Encounter (Signed)
I have called and left message for sleep center to call patient about appt

## 2020-02-22 NOTE — TOC Initial Note (Addendum)
Transition of Care [facility]) - Initial/Assessment Note    Patient Details  Name: James Villegas MRN: [id number] Date of Birth: 1955/06/25  [facility] Care [facility]) CM/SW Contact:    Golda Acre, RN Phone Number: 02/22/2020, 9:44 AM  Clinical Narrative:                 See other entry for hx Plan is to obtain sleep study and return to [facility]. Updated fl2 ssent to [facility] on [phone] at [phone]. Expected Discharge Plan: Skilled Nursing Facility (from [facility]) Barriers to Discharge: Continued Medical Work up   Patient Goals and CMS Choice Patient states their goals for this hospitalization and ongoing recovery are:: to go back CMS Medicare.gov Compare Post Acute Care list provided to:: Patient    Expected Discharge Plan and Services Expected Discharge Plan: Skilled Nursing Facility (from [facility])   Discharge Planning Services: CM Consult Post Acute Care Choice: Skilled Nursing Facility Living arrangements for the past 2 months: Skilled Nursing Facility                                      Prior Living Arrangements/Services Living arrangements for the past 2 months: Skilled Nursing Facility Lives with:: Facility Resident Patient language and need for interpreter reviewed:: No Do you feel safe going back to the place where you live?: Yes      Need for Family Participation in Patient Care: No (Comment) Care giver support system in place?: Yes (comment)   Criminal Activity/Legal Involvement Pertinent to Current Situation/Hospitalization: No - Comment as needed  Activities of Daily Living Home Assistive Devices/Equipment: Blood pressure cuff, Grab bars around toilet, Grab bars in shower, Hand-held shower hose, Hospital bed, [employer], Nebulizer, Oxygen, Environmental consultant (specify type), Wheelchair ([facility] has necessary equipment for their residents) ADL Screening (condition at time of admission) Patient's cognitive ability adequate to safely complete  daily activities?: No (Patient not verbalizing much) Is the patient deaf or have difficulty hearing?: No Does the patient have difficulty seeing, even when wearing glasses/contacts?: No Does the patient have difficulty concentrating, remembering, or making decisions?: Yes Patient able to express need for assistance with ADLs?: Yes Does the patient have difficulty dressing or bathing?: Yes Independently performs ADLs?: No Communication: Needs assistance (verbal to stimuli) Is this a change from baseline?: Change from baseline, expected to last >3 days Dressing (OT): Dependent Is this a change from baseline?: Change from baseline, expected to last >3 days Grooming: Dependent Is this a change from baseline?: Change from baseline, expected to last >3 days Feeding: Dependent Is this a change from baseline?: Change from baseline, expected to last >3 days Bathing: Dependent Is this a change from baseline?: Change from baseline, expected to last >3 days Toileting: Dependent Is this a change from baseline?: Change from baseline, expected to last >3days In/Out Bed: Dependent Is this a change from baseline?: Change from baseline, expected to last >3 days Walks in Home: Dependent Is this a change from baseline?: Change from baseline, expected to last >3 days Does the patient have difficulty walking or climbing stairs?: Yes (secondary to weakness) Weakness of Legs: Both Weakness of Arms/Hands: Both  Permission Sought/Granted                  Emotional Assessment Appearance:: Appears stated age     Orientation: : Oriented to Self, Oriented to Place, Oriented to  Time, Oriented to Situation Alcohol / Substance Use: Not Applicable Psych Involvement: No (comment)  Admission diagnosis:  Acute respiratory failure (HCC) [J96.00] Vomiting [R11.10] AKI (acute kidney injury) (HCC) [N17.9] HCAP (healthcare-associated pneumonia) [J18.9] Hypercapnic respiratory failure (HCC) [J96.92] Patient  Active Problem List   Diagnosis Date Noted  . Acute respiratory failure (HCC) 02/20/2020  . CAP (community acquired pneumonia) 02/20/2020  . Obesity, Class III, BMI 40-49.9 (morbid obesity) (HCC) 02/20/2020  . Bradycardia 02/20/2020  . Hyperkalemia 02/20/2020  . Hypercapnic respiratory failure (HCC) 02/20/2020  . Acute on chronic congestive heart failure (HCC)   . New onset atrial fibrillation (HCC)   . Chronic anticoagulation   . Respiratory failure (HCC) 08/02/2018  . Anemia of other chronic disease 11/23/2013  . Pain in joint, ankle and foot 11/14/2013  . Other convulsions 10/19/2013  . Noninfectious gastroenteritis and colitis 07/29/2013  . Left upper quadrant abdominal mass 07/28/2013  . Other specified disease of white blood cells 07/16/2013  . Ingrowing toenail with infection 05/20/2013  . Late effects of cerebrovascular disease 10/15/2012  . Pure hypercholesterolemia 10/15/2012  . Essential hypertension, benign 10/15/2012  . Hereditary and idiopathic peripheral neuropathy 10/15/2012  . 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 Cerebral infarction (HCC) 08/29/2011  . DVT (deep venous thrombosis) (HCC) 08/29/2011  . Bloody stool 08/29/2011  . PFO (patent foramen ovale) 02/18/2011   PCP:  [name], MD Pharmacy:  No Pharmacies Listed    Social Determinants of Health (SDOH) Interventions    Readmission Risk Interventions No flowsheet data found.

## 2020-02-22 NOTE — Progress Notes (Signed)
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 Fenofibrate Other (See Comments)    Per [facility]    Patient Measurements: Height: 6\' 1"  (185.4 cm) Weight: 135 kg (297 lb 9.9 oz) IBW/kg (Calculated) : 79.9 Heparin Dosing Weight:  Vital Signs: Temp: 98.1 F (36.7 C) (09/15 0822) Temp Source: Oral (09/15 0822) BP: 141/95 (09/15 0900) Pulse Rate: 94 (09/15 0900)  Labs: Recent Labs    02/20/20 1002 02/20/20 1002 02/20/20 1018 02/20/20 1018 02/20/20 1247 02/20/20 1515 02/21/20 0253 02/22/20 0245  HGB 14.6   < > 15.3   < >  --   --  13.8 13.8  HCT 46.0   < > 45.0  --   --   --  42.5 43.9  PLT 239  --   --   --   --   --  164 151  APTT 36  --   --   --   --   --   --   --   LABPROT 36.7*   < >  --   --   --  39.8* 34.4* 22.8*  INR 3.9*   < >  --   --   --  4.3* 3.6* 2.1*  CREATININE 1.25*   < > 1.30*  --   --   --  0.81 0.60*  TROPONINIHS 34*  --   --   --  33*  --   --   --    < > = values in this interval not displayed.    Estimated Creatinine Clearance: 139.8 mL/min (A) (by C-G formula based on SCr of 0.6 mg/dL (L)).   Medications:  Scheduled:  . chlorhexidine  15 mL Mouth Rinse BID  . chlorhexidine  15 mL Mouth Rinse BID  . Chlorhexidine Gluconate Cloth  6 each Topical Daily  . cycloSPORINE  1 drop Both Eyes BID  . ezetimibe  10 mg Oral Daily  . ipratropium-albuterol  3 mL Nebulization Q6H WA  . mouth rinse  15 mL Mouth Rinse q12n4p  . mouth rinse  15 mL Mouth Rinse q12n4p  . omega-3 acid ethyl esters  1 g Oral BID  . phenytoin  200 mg Oral BID  . rosuvastatin  40 mg Oral QHS   Infusions:  . sodium chloride Stopped (02/22/20 0712)  . ceFEPime (MAXIPIME) IV Stopped (02/22/20 02/24/20)    Assessment: 74 yoM admitted on 9/13 with AMS, N/V, respiratory failure.  PMH includes hypercoagulable state on chronic  warfarin anticoagulation.  Admission INR was supratherapeutic and warfarin was held.  Pharmacy is now consulted to resume warfarin when INR normalizes.  PTA warfarin 3.5 mg daily.  LD on 9/12 at 1800. Admission INR 4.3  Today, 02/22/2020: INR with large decrease to 2.1, in therapeutic range. CBC: Hgb and Plt stable/wnl No bleeding or complications reported Diet: advanced to regular diet. No major drug-drug interactions noted.  Broad spectrum antibiotics, Cefepime, may increase INR.  Goal of Therapy:  INR 2-3 Monitor platelets by anticoagulation protocol: Yes   Plan:  Warfarin 3 mg PO x 1. Daily PT/INR. Monitor for signs and symptoms of bleeding.   02/24/2020 PharmD, BCPS Clinical Pharmacist [facility] main pharmacy [phone] 02/22/2020 10:40 AM

## 2020-02-22 NOTE — TOC Initial Note (Signed)
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  Per EMS report he had nausea and vomiting for 3 days prior and was found responsive this morning at his nursing home. He was noted to have an oxygen saturation around 49% and was placed on a simple facemask with improvement in his oxygen saturations. He was responsive to verbal stimuli.   Lab work up showed potassium of 6.1, Cr 1.25 (Baseline 0.6). BNP 670. Troponin 34. Lactic acid 2.7. WBC count 12.3k. Urinalysis with moderate hemoglobin.  CT head did not show acute pathology. CTA chest did not indicate pulmonary emboli. Small dependent bilateral pleural effusions. Patchy ground glass areas, scattered. EKG shows atrial fibrillation or flutter, inverted T waves in leads V1-V5 similar to prior EKG 07/2019.  Patient was initially admitted to the hospitalist service but due to the persistent somnolence an ABG was obtained showing pH 7.2, pCO2 80, pO2 124. PCCM was then consulted.          Patient Goals and CMS Choice        Expected Discharge Plan and Services                                                Prior Living Arrangements/Services                       Activities of Daily Living Home Assistive Devices/Equipment: Blood pressure cuff, Grab bars around toilet, Grab bars in shower, Hand-held shower hose, Hospital bed, [employer], Nebulizer, Oxygen, Environmental consultant (specify type), Wheelchair ([facility] has necessary equipment for their residents) ADL Screening (condition at  time of admission) Patient's cognitive ability adequate to safely complete daily activities?: No (Patient not verbalizing much) Is the patient deaf or have difficulty hearing?: No Does the patient have difficulty seeing, even when wearing glasses/contacts?: No Does the patient have difficulty concentrating, remembering, or making decisions?: Yes Patient able to express need for assistance with ADLs?: Yes Does the patient have difficulty dressing or bathing?: Yes Independently performs ADLs?: No Communication: Needs assistance (verbal to stimuli) Is this a change from baseline?: Change from baseline, expected to last >3 days Dressing (OT): Dependent Is this a change from baseline?: Change from baseline, expected to last >3 days Grooming: Dependent Is this a change from baseline?: Change from baseline, expected to last >3 days Feeding: Dependent Is this a change from baseline?: Change from baseline, expected to last >3 days Bathing: Dependent Is this a change from baseline?: Change from baseline, expected to last >3 days Toileting: Dependent Is this a change from baseline?: Change from baseline, expected to last >3days In/Out Bed: Dependent Is this a change from baseline?: Change from baseline, expected to last >3 days Walks in Home: Dependent Is this a change from baseline?: Change from baseline, expected to last >3 days Does the patient have difficulty walking  or climbing stairs?: Yes (secondary to weakness) Weakness of Legs: Both Weakness of Arms/Hands: Both  Permission Sought/Granted                  Emotional Assessment              Admission diagnosis:  Acute respiratory failure (HCC) [J96.00] Vomiting [R11.10] AKI (acute kidney injury) (HCC) [N17.9] HCAP (healthcare-associated pneumonia) [J18.9] Hypercapnic respiratory failure (HCC) [J96.92] Patient Active Problem List   Diagnosis Date Noted  . Acute respiratory failure (HCC) 02/20/2020  . CAP (community  acquired pneumonia) 02/20/2020  . Obesity, Class III, BMI 40-49.9 (morbid obesity) (HCC) 02/20/2020  . Bradycardia 02/20/2020  . Hyperkalemia 02/20/2020  . Hypercapnic respiratory failure (HCC) 02/20/2020  . Acute on chronic congestive heart failure (HCC)   . New onset atrial fibrillation (HCC)   . Chronic anticoagulation   . Respiratory failure (HCC) 08/02/2018  . Anemia of other chronic disease 11/23/2013  . Pain in joint, ankle and foot 11/14/2013  . Other convulsions 10/19/2013  . Noninfectious gastroenteritis and colitis 07/29/2013  . Left upper quadrant abdominal mass 07/28/2013  . Other specified disease of white blood cells 07/16/2013  . Ingrowing toenail with infection 05/20/2013  . Late effects of cerebrovascular disease 10/15/2012  . Pure hypercholesterolemia 10/15/2012  . Essential hypertension, benign 10/15/2012  . Hereditary and idiopathic peripheral neuropathy 10/15/2012  . 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 Cerebral infarction (HCC) 08/29/2011  . DVT (deep venous thrombosis) (HCC) 08/29/2011  . Bloody stool 08/29/2011  . PFO (patent foramen ovale) 02/18/2011   PCP:  [name], MD Pharmacy:  No Pharmacies Listed    Social Determinants of Health (SDOH) Interventions    Readmission Risk Interventions No flowsheet data found.

## 2020-02-22 NOTE — Progress Notes (Signed)
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  N/V x3 days, O2 sats 49% on EMS evaluation.  Work up consistent with AKI, hyperkalemia, and acute hypercarbic respiratory failure  CT head did not show acute pathology. CTA chest did not indicate pulmonary emboli. Small dependent bilateral pleural effusions. Patchy ground glass areas, scattered. EKG shows atrial fibrillation or flutter, inverted T waves in leads V1-V5 similar to prior EKG 07/2019.    Assessment & Plan:   Principal Problem:   Acute respiratory failure (HCC) Active Problems:   Essential hypertension, benign   CAP (community acquired pneumonia)   Obesity, Class III, BMI 40-49.9 (morbid obesity) (HCC)   Bradycardia   Hyperkalemia   Hypercapnic respiratory failure (HCC)   Pressure injury of skin   AKI (acute kidney injury) (HCC)   HCAP (healthcare-associated pneumonia)   Acute metabolic encephalopathy   Hypercoagulable state (HCC)   Seizure disorder (HCC)   Chronic diastolic CHF (congestive heart failure) (HCC)   Atrial fibrillation, chronic (HCC)   History of CVA (cerebrovascular accident)   Hyperlipidemia   #1  Acute hypoxemic and hypercapnic respiratory failure, likely secondary to underlying obstructive sleep apnea, component of obesity hypoventilation syndrome, concern for pneumonia, POA Patient admitted to critical care service on admission, due to severe hypoxemia and hypercapnia and hypercapnia.  CTA negative for PE, concern for possible pneumonia on chest x-ray.  Patient noted to have supratherapeutic INR and as such less likely  secondary to an acute PE.  Patient with clinical improvement on BiPAP.  Patient received a dose of Lasix 40 mg IV x1 on 02/21/2020 with a urine output of 1.8 L.  We will give another dose of Lasix 40 mg IV x1.  Urine Legionella antigen negative.  Urine pneumococcus antigen negative.  Patient was on IV vancomycin, IV cefepime.  IV vancomycin discontinued.  Patient improved clinically over the past 24 to 48 hours.  Narrow IV cefepime to IV Rocephin and treat empirically for total of 5 days.  Continue duo nebs.  Continue BiPAP nightly and as needed daytime sleep.  PCCM/pulmonary attempting to arrange for BiPAP at facility and outpatient follow-up.  Tolerating current diet.  Follow.  2.  Acute metabolic encephalopathy Likely secondary to problem #1.  CT head negative.  UDS negative.  TSH within normal limits.  Vitamin B12 within normal limits.  Phenytoin level normal.  Improved clinically.  Continue BiPAP as noted above.  Limit sedating medications.  Continue to hold gabapentin, hydroxyzine, trazodone, Effexor.  Follow.  3.  Acute kidney injury Secondary to a prerenal azotemia in the setting of poor oral intake, emesis.  Urinalysis done with small leukocytes, nitrite negative, 21-50 WBCs.  Renal function improved creatinine down to 0.60.  Patient received a dose of Lasix 40 mg IV x1 with a urine output of 1.8 L over the past 24 hours.  We will give another dose of Lasix 40 mg IV x1.  Strict I's and O's.  Daily weights.  4.  Seizure disorder Stable.  Continue current dose of phenytoin.  5.  Hypercoagulable state INR noted to be supratherapeutic on admission however  currently therapeutic.  Coumadin per pharmacy.  6.  Chronic diastolic heart failure/chronic A. fib/A-flutter/history of PFO No noted closure of PFO per cards last note.  Resume home regimen Lopressor.  Patient to receive Lasix 40 mg IV x1 today.  Could likely resume home regimen diuretics in the next 1 to 2 days.  Continue Coumadin for  anticoagulation.  Follow.  7.  Hyperlipidemia Continue Lovaza, statin.  8.  History of CVA with residual right-sided weakness/hemiparesis Stable.  PT/OT.  Continue statin, risk factor modification, anticoagulation with Coumadin.Follow.  DVT prophylaxis: Coumadin per pharmacy Code Status: DNR Family Communication: Updated patient.  No family at bedside. Disposition:   Status is: Inpatient    Dispo: The patient is from: SNF              Anticipated d/c is to: Back to SNF              Anticipated d/c date is: To be determined              Patient currently on IV antibiotics, improving clinically,On bipap nightly and as needed.  Not stable for discharge.       Consultants:   PCCM admission  Significant Diagnostic Tests:  ECHO 08/24/19 >> EF 60-65%. Normal LV and RV function. No significant valve abnormalities. RVSP 29.75mmHg. CT Head 9/13 >> no acute abnormalities CTA Chest 9/13 >> No PE. Respiratory Motion artifact. Small bilateral pleural effusions and dependent atelectasis.  Abdominal ultrasound 02/20/2020  Micro Data:  COVID 9/13 >> negative  BCx2 9/13 >> Urine 9/13 >>  Significant Hospital Events   9/13 Admit with N/V, AMS in setting of hypercarbic resp fx, Bipap started in ED   9/14 Mental status resolved, appears to be at baseline       Antimicrobials:  Cefepime 9/13 >> 02/22/2020 Vancomycin 9/13 >> x 1 dose Rocephin 02/22/2020>>>>   Subjective: Patient laying in bed about to be transferred by [facility] lift to chair.  States did not sleep too well with BiPAP overnight.  Denies chest pain.  No shortness of breath.  No abdominal pain.  Tolerating current diet.  Objective: Vitals:   02/22/20 1000 02/22/20 1100 02/22/20 1228 02/22/20 1544  BP: 119/87 (!) 150/90    Pulse: 73 91    Resp: (!) 25 (!) 28    Temp:   98 F (36.7 C) (!) 97.4 F (36.3 C)  TempSrc:   Oral Oral  SpO2: 100% 94%    Weight:      Height:        Intake/Output Summary (Last 24 hours)  at 02/22/2020 1609 Last data filed at 02/22/2020 0800 Gross per 24 hour  Intake 289.96 ml  Output 1500 ml  Net -1210.04 ml   Filed Weights   02/20/20 0948 02/21/20 0500 02/22/20 0500  Weight: 135 kg 135 kg 135 kg    Examination:  General exam: Chronically ill-appearing. Respiratory system: Clear to auscultation anterior lung fields. Respiratory effort normal. Cardiovascular system: S1 & S2 heard, RRR. No JVD, murmurs, rubs, gallops or clicks.  2+ bilateral lower extremity edema right greater than left.  Gastrointestinal system: Abdomen is obese, nondistended, soft and nontender. No organomegaly or masses felt. Normal bowel sounds heard. Central nervous system: Alert and oriented.  Right hemiparesis.  No focal neurological deficits. Extremities: Symmetric 5 x 5 power. Skin: No rashes, lesions or ulcers Psychiatry: Judgement and insight appear normal. Mood & affect appropriate.     Data Reviewed: I have personally reviewed following labs  and imaging studies  CBC: Recent Labs  Lab 02/20/20 1002 02/20/20 1018 02/21/20 0253 02/22/20 0245  WBC 12.3*  --  10.7* 9.4  NEUTROABS 9.8*  --   --   --   HGB 14.6 15.3 13.8 13.8  HCT 46.0 45.0 42.5 43.9  MCV 97.5  --  95.7 97.6  PLT 239  --  164 151    Basic Metabolic Panel: Recent Labs  Lab 02/20/20 1002 02/20/20 1018 02/21/20 0253 02/22/20 0245  NA 134* 132* 135 138  K 6.1* 5.8* 4.5 3.8  CL 92* 93* 93* 94*  CO2 29  --  34* 33*  GLUCOSE 144* 138* 98 88  BUN 22 23 22 13   CREATININE 1.25* 1.30* 0.81 0.60*  CALCIUM 8.5*  --  8.6* 8.4*  MG  --   --  2.1  --   PHOS  --   --  3.7  --     GFR: Estimated Creatinine Clearance: 139.8 mL/min (A) (by C-G formula based on SCr of 0.6 mg/dL (L)).  Liver Function Tests: Recent Labs  Lab 02/20/20 1002  AST 136*  ALT 124*  ALKPHOS 98  BILITOT 0.4  PROT 6.7  ALBUMIN 3.4*    CBG: No results for input(s): GLUCAP in the last 168 hours.   Recent Results (from the past 240  hour(s))  Blood culture (routine single)     Status: None (Preliminary result)   Collection Time: 02/20/20 10:02 AM   Specimen: BLOOD  Result Value Ref Range Status   Specimen Description   Final    BLOOD LEFT ANTECUBITAL Performed at [facility], [address] [place], [place]    Special Requests   Final    BOTTLES DRAWN AEROBIC AND ANAEROBIC Blood Culture adequate volume Performed at [facility], [address] [place], [place]    Culture   Final    NO GROWTH 2 DAYS Performed at [facility], [address], [place], [place]    Report Status PENDING  Incomplete  Urine culture     Status: Abnormal   Collection Time: 02/20/20 10:02 AM   Specimen: In/Out Cath Urine  Result Value Ref Range Status   Specimen Description   Final    IN/OUT CATH URINE Performed at [facility], [address], [place], [place]    Special Requests   Final    NONE Performed at [facility], [address], [place], [place]    Culture 60,000 COLONIES/mL KLEBSIELLA PNEUMONIAE (A)  Final   Report Status 02/22/2020 FINAL  Final   Organism ID, Bacteria KLEBSIELLA PNEUMONIAE (A)  Final      Susceptibility   Klebsiella pneumoniae - MIC*    AMPICILLIN >=32 RESISTANT Resistant     CEFAZOLIN <=4 SENSITIVE Sensitive     CEFTRIAXONE <=0.25 SENSITIVE Sensitive     CIPROFLOXACIN <=0.25 SENSITIVE Sensitive     GENTAMICIN <=1 SENSITIVE Sensitive     IMIPENEM <=0.25 SENSITIVE Sensitive     NITROFURANTOIN 64 INTERMEDIATE Intermediate     TRIMETH/SULFA <=20 SENSITIVE Sensitive     AMPICILLIN/SULBACTAM 8 SENSITIVE Sensitive     PIP/TAZO <=4 SENSITIVE Sensitive     * 60,000 COLONIES/mL KLEBSIELLA PNEUMONIAE  SARS Coronavirus 2 by RT PCR (hospital order, performed in [facility] hospital lab) Nasopharyngeal Nasopharyngeal Swab     Status: None   Collection Time: 02/20/20 10:02 AM     Specimen: Nasopharyngeal  Swab  Result Value Ref Range Status   SARS Coronavirus 2 NEGATIVE NEGATIVE Final    Comment: (NOTE) SARS-CoV-2 target nucleic acids are NOT DETECTED.  The SARS-CoV-2 RNA is generally detectable in upper and lower respiratory specimens during the acute phase of infection. The lowest concentration of SARS-CoV-2 viral copies this assay can detect is 250 copies / mL. A negative result does not preclude SARS-CoV-2 infection and should not be used as the sole basis for treatment or other patient management decisions.  A negative result may occur with improper specimen collection / handling, submission of specimen other than nasopharyngeal swab, presence of viral mutation(s) within the areas targeted by this assay, and inadequate number of viral copies (<250 copies / mL). A negative result must be combined with clinical observations, patient history, and epidemiological information.  Fact Sheet for Patients:   BoilerBrush.com.cyhttps://www.fda.gov/media/136312/download  Fact Sheet for Healthcare Providers: https://pope.com/https://www.fda.gov/media/136313/download  This test is not yet approved or  cleared by the [place] FDA and has been authorized for detection and/or diagnosis of SARS-CoV-2 by FDA under an Emergency Use Authorization (EUA).  This EUA will remain in effect (meaning this test can be used) for the duration of the COVID-19 declaration under Section 564(b)(1) of the Act, 21 U.S.C. section 360bbb-3(b)(1), unless the authorization is terminated or revoked sooner.  Performed at [facility], [address] [place], [place] [address]   MRSA PCR Screening     Status: None   Collection Time: 02/20/20  8:43 PM   Specimen: Nasal Mucosa; Nasopharyngeal  Result Value Ref Range Status   MRSA by PCR NEGATIVE NEGATIVE Final    Comment:        The GeneXpert MRSA Assay (FDA approved for NASAL specimens only), is one component of a comprehensive MRSA  colonization surveillance program. It is not intended to diagnose MRSA infection nor to guide or monitor treatment for MRSA infections. Performed at [facility], [address] [place], [place] [address]          Radiology Studies: US Abdomen Limited RUQ  Result Date: 02/20/2020 CLINICAL DATA:  Vomiting EXAM: ULTRASOUND ABDOMEN LIMITED RIGHT UPPER QUADRANT COMPARISON:  CT abdomen pelvis 06/30/2013 FINDINGS: Gallbladder: No gallstones within the gallbladder lumen. Gallbladder wall thickening measuring up to 10 mm is identified. No pericholecystic fluid. A negative sonographic Eulah PontMurphy sign was reported by sonographer; however, please note that it is documented that patient was not awake adn on BIPAP during the exam. Common bile duct: Diameter: 5 mm Liver: Normal in size. No focal lesion identified. Within normal limits in parenchymal echogenicity. Portal vein is patent on color Doppler imaging with normal direction of blood flow towards the liver. Right kidney: Incompletely evaluated but measuring up to 11.8 cm in length. Pancreas not visualized. Other: None. IMPRESSION: Markedly limited right upper quadrant ultrasound due to patient's mental status leading to difficulty in cooperation per the sonographer notes. Nonspecific marked gallbladder wall thickening up to 10 mm which can be seen in chronic liver disease versus gallbladder disease. No associated pericholecystic fluid or cholelithiasis identified. Hepatic parenchyma grossly unremarkable. Electronically Signed   By: Tish FredericksonMorgane  Naveau M.D.   On: 02/20/2020 19:28        Scheduled Meds:  chlorhexidine  15 mL Mouth Rinse BID   chlorhexidine  15 mL Mouth Rinse BID   Chlorhexidine Gluconate Cloth  6 each Topical Daily   cycloSPORINE  1 drop Both Eyes BID   ezetimibe  10 mg Oral Daily  ipratropium-albuterol  3 mL Nebulization Q6H WA   mouth rinse  15 mL Mouth Rinse q12n4p   mouth rinse  15 mL Mouth Rinse q12n4p    metoprolol tartrate  12.5 mg Oral BID   omega-3 acid ethyl esters  1 g Oral BID   pantoprazole  40 mg Oral Daily   phenytoin  200 mg Oral BID   rosuvastatin  40 mg Oral QHS   warfarin  3 mg Oral ONCE-1600   Warfarin - Pharmacist Dosing Inpatient   Does not apply q1600   Continuous Infusions:  sodium chloride Stopped (02/22/20 0712)   cefTRIAXone (ROCEPHIN)  IV Stopped (02/22/20 1614)     LOS: 2 days    Time spent: 40 minutes    Ramiro Harvest, MD [facility]   To contact the attending provider between 7A-7P or the covering provider during after hours 7P-7A, please log into the web site [email or web address] and access using universal [place] password for that web site. If you do not have the password, please call the hospital operator.  02/22/2020, 4:09 PM

## 2020-02-22 NOTE — Progress Notes (Signed)
Pt was encouraged to wear nocturnal bipap but he still refused.  Pt stated it is too uncomfortable.  Pt is aware that RT is available all night should he change his mind.

## 2020-02-23 LAB — CBC WITH DIFFERENTIAL/PLATELET
Abs Immature Granulocytes: 0.11 10*3/uL — ABNORMAL HIGH (ref 0.00–0.07)
Basophils Absolute: 0.1 10*3/uL (ref 0.0–0.1)
Basophils Relative: 1 %
Eosinophils Absolute: 0.4 10*3/uL (ref 0.0–0.5)
Eosinophils Relative: 4 %
HCT: 43.2 % (ref 39.0–52.0)
Hemoglobin: 14.4 g/dL (ref 13.0–17.0)
Immature Granulocytes: 1 %
Lymphocytes Relative: 11 %
Lymphs Abs: 1.3 10*3/uL (ref 0.7–4.0)
MCH: 31.3 pg (ref 26.0–34.0)
MCHC: 33.3 g/dL (ref 30.0–36.0)
MCV: 93.9 fL (ref 80.0–100.0)
Monocytes Absolute: 1.1 10*3/uL — ABNORMAL HIGH (ref 0.1–1.0)
Monocytes Relative: 10 %
Neutro Abs: 8.2 10*3/uL — ABNORMAL HIGH (ref 1.7–7.7)
Neutrophils Relative %: 73 %
Platelets: 195 10*3/uL (ref 150–400)
RBC: 4.6 MIL/uL (ref 4.22–5.81)
RDW: 13.4 % (ref 11.5–15.5)
WBC: 11.1 10*3/uL — ABNORMAL HIGH (ref 4.0–10.5)
nRBC: 0 % (ref 0.0–0.2)

## 2020-02-23 LAB — PROTIME-INR
INR: 1.3 — ABNORMAL HIGH (ref 0.8–1.2)
Prothrombin Time: 15.3 s — ABNORMAL HIGH (ref 11.4–15.2)

## 2020-02-23 LAB — BASIC METABOLIC PANEL
Anion gap: 9 (ref 5–15)
BUN: 10 mg/dL (ref 8–23)
CO2: 30 mmol/L (ref 22–32)
Calcium: 8.7 mg/dL — ABNORMAL LOW (ref 8.9–10.3)
Chloride: 99 mmol/L (ref 98–111)
Creatinine, Ser: 0.51 mg/dL — ABNORMAL LOW (ref 0.61–1.24)
GFR calc Af Amer: >60 mL/min (ref >60)
GFR calc non Af Amer: >60 mL/min (ref >60)
Glucose, Bld: 94 mg/dL (ref 70–99)
Potassium: 3.9 mmol/L (ref 3.5–5.1)
Sodium: 138 mmol/L (ref 135–145)

## 2020-02-23 LAB — PROCALCITONIN: Procalcitonin: <0.1 ng/mL

## 2020-02-23 LAB — MAGNESIUM: Magnesium: 2.1 mg/dL (ref 1.7–2.4)

## 2020-02-23 MED ORDER — ENOXAPARIN SODIUM 150 MG/ML ~~LOC~~ SOLN
140.0000 mg | Freq: Once | SUBCUTANEOUS | Status: AC
  Administered 2020-02-23: 140 mg via SUBCUTANEOUS
  Filled 2020-02-23: qty 0.94

## 2020-02-23 MED ORDER — SPIRONOLACTONE 25 MG PO TABS
25.0000 mg | ORAL_TABLET | Freq: Every day | ORAL | Status: DC
  Administered 2020-02-23 – 2020-02-28 (×6): 25 mg via ORAL
  Filled 2020-02-23 (×6): qty 1

## 2020-02-23 MED ORDER — ENOXAPARIN SODIUM 150 MG/ML ~~LOC~~ SOLN
140.0000 mg | Freq: Two times a day (BID) | SUBCUTANEOUS | Status: DC
  Administered 2020-02-24 – 2020-02-28 (×9): 140 mg via SUBCUTANEOUS
  Filled 2020-02-23 (×10): qty 0.94

## 2020-02-23 MED ORDER — POTASSIUM CHLORIDE CRYS ER 10 MEQ PO TBCR
20.0000 meq | EXTENDED_RELEASE_TABLET | Freq: Every day | ORAL | Status: DC
  Administered 2020-02-23 – 2020-02-24 (×2): 20 meq via ORAL
  Filled 2020-02-23 (×2): qty 2

## 2020-02-23 MED ORDER — FUROSEMIDE 40 MG PO TABS
40.0000 mg | ORAL_TABLET | Freq: Two times a day (BID) | ORAL | Status: DC
  Administered 2020-02-23 – 2020-02-28 (×11): 40 mg via ORAL
  Filled 2020-02-23 (×11): qty 1

## 2020-02-23 MED ORDER — POLYVINYL ALCOHOL 1.4 % OP SOLN
1.0000 [drp] | Freq: Four times a day (QID) | OPHTHALMIC | Status: DC
  Administered 2020-02-23 – 2020-02-28 (×19): 1 [drp] via OPHTHALMIC
  Filled 2020-02-23 (×2): qty 15

## 2020-02-23 MED ORDER — WARFARIN SODIUM 6 MG PO TABS
6.0000 mg | ORAL_TABLET | Freq: Once | ORAL | Status: AC
  Administered 2020-02-23: 6 mg via ORAL
  Filled 2020-02-23: qty 1

## 2020-02-23 MED ORDER — WARFARIN SODIUM 5 MG PO TABS: 5.0000 mg | ORAL_TABLET | Freq: Once | ORAL | Status: DC

## 2020-02-23 NOTE — Evaluation (Signed)
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 is a 64 year old man who presents from local facility with dense right sided hemiplegia. On evaluation he exhibits no functional movement of right upper or lower extremity, requires assistance for ADLs predominantly in bed, +2 assistance to transfer to side of bed and the need of a hoyer for chair transfers. Patient continues to be able to perform grooming and feeding with left hand. Patient is at his baseline. No further OT needs at this time. Return to facility. Use of hoyer for transfers while in hospital needed.    Follow Up Recommendations  No OT follow up;Other (comment) (Return to Facility)    Equipment Recommendations  None recommended by OT    Recommendations for Other Services       Precautions / Restrictions Precautions Precautions: Fall Precaution Comments: Dense Right Sided Hemiplegia Restrictions Weight Bearing Restrictions: No      Mobility Bed Mobility Overal bed mobility: Needs Assistance Bed Mobility: Rolling;Supine to Sit;Sit to Supine Rolling: Min assist   Supine to sit: Max assist;+2 for physical assistance;+2 for safety/equipment Sit to supine: Max assist;+2 for safety/equipment;+2 for physical assistance      Transfers                 General transfer comment: unable to stand.    Balance Overall balance assessment: Needs assistance Sitting-balance support: No upper extremity supported Sitting balance-Leahy Scale: Fair                                      ADL either performed or assessed with clinical judgement   ADL Overall ADL's : Needs assistance/impaired Eating/Feeding: Set up   Grooming: Set up   Upper Body Bathing: Set up;Bed level;Minimal assistance   Lower Body Bathing: Total assistance;Bed level   Upper Body Dressing : Moderate assistance;Bed level   Lower Body Dressing: Total assistance;Bed level     Toilet Transfer Details (indicate cue type and reason): unable Toileting- Clothing Manipulation and Hygiene: Total assistance;Bed level               Vision         Perception     Praxis      Pertinent Vitals/Pain Pain Assessment: No/denies pain     Hand Dominance Left   Extremity/Trunk Assessment Upper Extremity Assessment Upper Extremity Assessment: RUE deficits/detail;LUE deficits/detail RUE Deficits / Details: Able to slightly flex fingers of right hand, otherwise no movement. RUE Sensation: decreased light touch RUE Coordination:  (Lacks fine and gross motor coordination) LUE Deficits / Details: WNL ROM/strength LUE Sensation: WNL LUE Coordination: WNL   Lower Extremity Assessment Lower Extremity Assessment: Defer to PT evaluation   Cervical / Trunk Assessment Cervical / Trunk Assessment: Normal   Communication Communication Communication: Expressive difficulties (MIld expressive difficulties. increased time to formulate words and sentences.)   Cognition Arousal/Alertness: Awake/alert Behavior During Therapy: WFL for tasks assessed/performed Overall Cognitive Status: Within Functional Limits for tasks assessed  General Comments       Exercises     Shoulder Instructions      Home Living Family/patient expects to be discharged to:: Skilled nursing facility                                        Prior Functioning/Environment Level of Independence: Needs assistance  Gait / Transfers Assistance Needed:  Michiel Sites to wc, manuevers wheel chair with Left side extremities. ADL's / Homemaking Assistance Needed: Able to perform grooming and feeding with setup. Toileting, bathing and dressing at bed level.            OT Problem List: Decreased strength;Decreased range of motion;Impaired balance (sitting and/or standing);Decreased coordination;Obesity;Impaired tone      OT Treatment/Interventions:      OT Goals(Current goals can be found in the care plan section) Acute Rehab OT Goals Patient Stated Goal: Did not state OT Goal Formulation: All assessment and education complete, DC therapy  OT Frequency:     Barriers to D/C:            Co-evaluation PT/OT/SLP Co-Evaluation/Treatment: Yes Reason for Co-Treatment: For patient/therapist safety;To address functional/ADL transfers PT goals addressed during session: Mobility/safety with mobility OT goals addressed during session: ADL's and self-care      AM-PAC OT "6 Clicks" Daily Activity     Outcome Measure Help from another person eating meals?: A Little Help from another person taking care of personal grooming?: A Little Help from another person toileting, which includes using toliet, bedpan, or urinal?: Total Help from another person bathing (including washing, rinsing, drying)?: A Lot Help from another person to put on and taking off regular upper body clothing?: A Lot Help from another person to put on and taking off regular lower body clothing?: Total 6 Click Score: 12   End of Session Nurse Communication: Mobility status  Activity Tolerance: Patient tolerated treatment well Patient left: in bed;with bed alarm set;with call bell/phone within reach;with family/visitor present  OT Visit Diagnosis: Muscle weakness (generalized) (M62.81);Hemiplegia and hemiparesis Hemiplegia - Right/Left: Right Hemiplegia - dominant/non-dominant: Non-Dominant Hemiplegia - caused by: Cerebral infarction                Time: 6599-3570 OT Time  Calculation (min): 31 min Charges:  OT General Charges $OT Visit: 1 Visit OT Evaluation $OT Eval Moderate Complexity: 1 Mod  Monalisa Bayless, OTR/L Acute Care Rehab Services  Office [phone] Pager: [phone]   Kelli Churn 02/23/2020, 4:45 PM

## 2020-02-23 NOTE — TOC Progression Note (Signed)
Transition of Care [facility]) - Progression Note    Patient Details  Name: James Villegas MRN: [id number] Date of Birth: 1956-03-20  Transition of Care [facility]) CM/SW Contact  Enis Leatherwood, Olegario Messier, RN Phone Number: 02/23/2020, 11:45 AM  Clinical Narrative:Noted patient to return back to [facility] will arrange otpt sleep study. Rep for [facility] aware.       Expected Discharge Plan: Skilled Nursing Facility (from [facility]) Barriers to Discharge: Continued Medical Work up  Expected Discharge Plan and Services Expected Discharge Plan: Skilled Nursing Facility (from [place])   Discharge Planning Services: CM Consult Post Acute Care Choice: Skilled Nursing Facility Living arrangements for the past 2 months: Skilled Nursing Facility                                       Social Determinants of Health (SDOH) Interventions    Readmission Risk Interventions No flowsheet data found.

## 2020-02-23 NOTE — Care Management Important Message (Signed)
Important Message  Patient Details IM Letter given to the Patient Name: James Villegas MRN: [id number] Date of Birth: January 10, 1956   Medicare Important Message Given:  Yes     Caren Macadam 02/23/2020, 11:25 AM

## 2020-02-23 NOTE — Progress Notes (Signed)
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 Fenofibrate Other (See Comments)    Per [facility]    Patient Measurements: Height: 6\' 1"  (185.4 cm) Weight: (!) 142.1 kg (313 lb 4.8 oz) IBW/kg (Calculated) : 79.9  Vital Signs: Temp: 98.3 F (36.8 C) (09/16 0820) Temp Source: Oral (09/16 0820) BP: 156/99 (09/16 0820) Pulse Rate: 83 (09/16 0820)  Labs: Recent Labs    02/21/20 0253 02/21/20 0253 02/22/20 0245 02/23/20 0512  HGB 13.8   < > 13.8 14.4  HCT 42.5  --  43.9 43.2  PLT 164  --  151 195  LABPROT 34.4*  --  22.8* 15.3*  INR 3.6*  --  2.1* 1.3*  CREATININE 0.81  --  0.60* 0.51*   < > = values in this interval not displayed.    Estimated Creatinine Clearance: 143.7 mL/min (A) (by C-G formula based on SCr of 0.51 mg/dL (L)).   Medications:  Scheduled:  . chlorhexidine  15 mL Mouth Rinse BID  . chlorhexidine  15 mL Mouth Rinse BID  . Chlorhexidine Gluconate Cloth  6 each Topical Daily  . cycloSPORINE  1 drop Both Eyes BID  . ezetimibe  10 mg Oral Daily  . furosemide  40 mg Oral BID  . ipratropium-albuterol  3 mL Nebulization BID  . mouth rinse  15 mL Mouth Rinse q12n4p  . mouth rinse  15 mL Mouth Rinse q12n4p  . metoprolol tartrate  12.5 mg Oral BID  . omega-3 acid ethyl esters  1 g Oral BID  . pantoprazole  40 mg Oral Daily  . phenytoin  200 mg Oral BID  . polyvinyl alcohol  1 drop Both Eyes QID  . potassium chloride  20 mEq Oral Daily  . rosuvastatin  40 mg Oral QHS  . spironolactone  25 mg Oral Daily  . Warfarin - Pharmacist Dosing Inpatient   Does not apply q1600   Infusions:  . sodium chloride Stopped (02/22/20 0712)  . cefTRIAXone (ROCEPHIN)  IV 2 g (02/23/20 1417)    Assessment: 66 yoM admitted on 9/13 with AMS, N/V, respiratory failure.  PMH includes hypercoagulable state on  chronic warfarin anticoagulation.  Admission INR was supratherapeutic and warfarin was held.  Pharmacy was consulted to resume warfarin when INR normalizes.  PTA warfarin 3.5 mg daily.  LD on 9/12 at 1800. -Admission INR 4.3  Today, 02/23/2020:  INR = 1.3 is now subtherapeutic after warfarin was held for 2 days. Per discussion with MD, will bridge with full dose LMWH until INR >2  CBC: WNL, stable  Diet: Regular; 100% of meal intake charted  No major new drug interactions noted. Noted that pt is on warfarin + phenytoin chronically.   TBW 142 kg  SCr WNL, CrCl > 30  Goal of Therapy:  INR 2-3 Monitor platelets by anticoagulation protocol: Yes   Plan:   Warfarin 6 mg PO once today  LMWH 1 mg/kg subcutaneous q12h until INR >2  Daily INR  02/25/2020, PharmD 02/23/20 2:52 PM

## 2020-02-23 NOTE — Progress Notes (Signed)
Pt continues to refuse BIPAP QHS.  RT to monitor and assess as needed.  

## 2020-02-23 NOTE — Evaluation (Signed)
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 On evaluation he exhibits no functional movement of right upper or lower extremity, requires assistance for ADLs predominantly in bed, +2 assistance to transfer to side of bed and the need of a hoyer for chair transfers. Patient continues to be able to perform grooming and feeding with left hand. Unfortunately, patient is at his baseline. No further PT needs at this time. Return to facility. Use of hoyer for transfers while in hospital needed.    Follow Up Recommendations SNF    Equipment Recommendations  None recommended by PT    Recommendations for Other Services       Precautions / Restrictions Precautions Precautions: Fall Precaution Comments: Dense Right Sided Hemiplegia Restrictions Weight Bearing Restrictions: No      Mobility  Bed Mobility Overal bed mobility: Needs Assistance Bed Mobility: Rolling;Supine to Sit;Sit to Supine Rolling: Min assist (To roll to R)   Supine to sit: Max assist;+2 for physical assistance;+2 for safety/equipment Sit to supine: Max assist;+2 for safety/equipment;+2 for physical assistance      Transfers                 General transfer comment: unable to stand.  Ambulation/Gait                Stairs            Wheelchair Mobility    Modified Rankin (Stroke Patients Only)       Balance Overall balance assessment: Needs assistance Sitting-balance  support: No upper extremity supported Sitting balance-Leahy Scale: Fair                                       Pertinent Vitals/Pain Pain Assessment: No/denies pain    Home Living Family/patient expects to be discharged to:: Skilled nursing facility                      Prior Function Level of Independence: Needs assistance   Gait / Transfers Assistance Needed: Harrel Lemon to wc, manuevers wheel chair with Left side extremities.  ADL's / Homemaking Assistance Needed: Able to perform grooming and feeding with setup. Toileting, bathing and dressing at bed level.        Hand Dominance   Dominant Hand: Left    Extremity/Trunk Assessment   Upper Extremity Assessment Upper Extremity Assessment: RUE deficits/detail RUE Deficits / Details: Able to slightly flex fingers of right hand, otherwise no movement. RUE Sensation: decreased light touch RUE Coordination:  (Lacks fine and gross motor coordination) LUE Deficits / Details: WNL ROM/strength LUE Sensation: WNL LUE Coordination: WNL    Lower Extremity Assessment Lower Extremity Assessment: RLE deficits/detail RLE Deficits / Details: trace muscle activity only RLE Sensation: decreased light touch    Cervical / Trunk Assessment Cervical / Trunk Assessment: Normal  Communication   Communication: Expressive difficulties (Increased time to formulate sentences)  Cognition Arousal/Alertness: Awake/alert Behavior  During Therapy: WFL for tasks assessed/performed Overall Cognitive Status: Within Functional Limits for tasks assessed                                        General Comments      Exercises     Assessment/Plan    PT Assessment All further PT needs can be met in the next venue of care  PT Problem List Decreased strength;Decreased activity tolerance;Decreased balance;Decreased mobility;Obesity;Impaired sensation;Impaired tone       PT Treatment Interventions      PT Goals  (Current goals can be found in the Care Plan section)  Acute Rehab PT Goals Patient Stated Goal: Did not state PT Goal Formulation: All assessment and education complete, DC therapy    Frequency     Barriers to discharge        Co-evaluation PT/OT/SLP Co-Evaluation/Treatment: Yes Reason for Co-Treatment: For patient/therapist safety PT goals addressed during session: Mobility/safety with mobility OT goals addressed during session: ADL's and self-care       AM-PAC PT "6 Clicks" Mobility  Outcome Measure Help needed turning from your back to your side while in a flat bed without using bedrails?: A Lot Help needed moving from lying on your back to sitting on the side of a flat bed without using bedrails?: A Lot Help needed moving to and from a bed to a chair (including a wheelchair)?: Total Help needed standing up from a chair using your arms (e.g., wheelchair or bedside chair)?: Total Help needed to walk in hospital room?: Total Help needed climbing 3-5 steps with a railing? : Total 6 Click Score: 8    End of Session   Activity Tolerance: Patient tolerated treatment well;Patient limited by fatigue Patient left: in bed;with call bell/phone within reach;with bed alarm set Nurse Communication: Mobility status;Need for lift equipment PT Visit Diagnosis: Difficulty in walking, not elsewhere classified (R26.2)    Time: 8338-2505 PT Time Calculation (min) (ACUTE ONLY): 30 min   Charges:   PT Evaluation $PT Eval Moderate Complexity: [place] Pager [phone] Office [phone]   [place] Hair 02/23/2020, 5:43 PM

## 2020-02-23 NOTE — Telephone Encounter (Signed)
I have scheduled pt a first avail sleep consult with Dr. Craige Cotta 04/24/20 after pt has split-night sleep study so we can be able to have results back by then.  Pt's appt will show up on AVS once pt is discharged from the hospital. Nothing further needed.

## 2020-02-23 NOTE — Telephone Encounter (Signed)
Pt is in a facility I have called them and gave them the sleep study appt date its 03/20/2020 he is still in the hosp though but they are going to give it to his nurse

## 2020-02-23 NOTE — Progress Notes (Signed)
PROGRESS NOTE    James Villegas  ZOX:[id number] DOB: 1956-03-10 DOA: 02/20/2020 PCP: [name], MD    Chief Complaint  Patient presents with  . 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  N/V x3 days, O2 sats 49% on EMS evaluation.  Work up consistent with AKI, hyperkalemia, and acute hypercarbic respiratory failure  CT head did not show acute pathology. CTA chest did not indicate pulmonary emboli. Small dependent bilateral pleural effusions. Patchy ground glass areas, scattered. EKG shows atrial fibrillation or flutter, inverted T waves in leads V1-V5 similar to prior EKG 07/2019.    Assessment & Plan:   Principal Problem:   Acute respiratory failure (HCC) Active Problems:   Essential hypertension, benign   CAP (community acquired pneumonia)   Obesity, Class III, BMI 40-49.9 (morbid obesity) (HCC)   Bradycardia   Hyperkalemia   Hypercapnic respiratory failure (HCC)   Pressure injury of skin   AKI (acute kidney injury) (HCC)   HCAP (healthcare-associated pneumonia)   Acute metabolic encephalopathy   Hypercoagulable state (HCC)   Seizure disorder (HCC)   Chronic diastolic CHF (congestive heart failure) (HCC)   Atrial fibrillation, chronic (HCC)   History of CVA (cerebrovascular accident)   Hyperlipidemia   1  Acute hypoxemic and hypercapnic respiratory failure, likely secondary to underlying obstructive sleep apnea, component of obesity hypoventilation syndrome, concern for pneumonia, POA Patient admitted to critical care service on admission, due to severe hypoxemia and hypercapnia and hypercapnia.  CTA negative for PE, concern for possible pneumonia on chest x-ray.  Patient noted to have supratherapeutic INR and as such less likely  secondary to an acute PE.  Patient with clinical improvement on BiPAP.  Patient received a dose of Lasix 40 mg IV x1 on 02/21/2020 with a urine output of 1.8 L.  Patient received another dose of Lasix 40 mg IV x1 (02/22/2020 ) with a urine output of 2 L.  Urine Legionella antigen negative.  Urine pneumococcus antigen negative.  Patient was on IV vancomycin, IV cefepime.  IV vancomycin discontinued.  Patient improved clinically and as such IV cefepime was narrowed to IV Rocephin for total of 5 days worth of treatment.  Continue duo nebs.  BiPAP nightly and as needed.  PCCM/pulmonary arranging outpatient sleep study for BiPAP at facility with outpatient follow-up.  Tolerating current diet.  Resume home regimen diuretics.  Follow.   2.  Acute metabolic encephalopathy Likely secondary to problem #1.  CT head negative.  UDS negative.  TSH within normal limits.  Vitamin B12 within normal limits.  Phenytoin level normal.  Clinical improvement.  Continue BiPAP as noted above.  Limit sedation medications.  Continue to hold gabapentin, hydroxyzine, trazodone.  Resume home regimen Effexor.  Monitor.  3.  Acute kidney injury Secondary to a prerenal azotemia in the setting of poor oral intake, emesis.  Urinalysis done with small leukocytes, nitrite negative, 21-50 WBCs.  Renal function improved creatinine down to 0.60.  Patient received a dose of Lasix 40 mg IV x1(02/21/2020) with a urine output of 1.8 L.  40 mg IV Lasix x1 given (02/22/2020 ) with a urine output of 2 L.  Renal function improved.  Patient will be resumed on home regimen Lasix and spironolactone.  Strict I's and O's.  Daily  weights.  Follow renal function with diuresis.   4.  Seizure disorder Stable.  Continue current dose of phenytoin.  5.  Hypercoagulable state INR noted to be supratherapeutic on admission however currently subtherapeutic.  INR at 1.3.  Lovenox bridge.  Coumadin per pharmacy.  6.  Chronic diastolic heart failure/chronic A.  fib/A-flutter/history of PFO No noted closure of PFO per cards last note.  Resume home regimen Lopressor.  Patient status post Lasix 40 mg IV x1 (02/22/2020 ) with a urine output of 2 L over the past 24 hours.  Resume home regimen oral Lasix and spironolactone.  INR subtherapeutic at 1.3 from 2.1.  Placed on a Lovenox bridge until INR is therapeutic due to history of CVA, A. fib a flutter and history of PFO.  Coumadin per pharmacy. Follow.  7.  Hyperlipidemia Continue Lovaza, statin.  8.  History of CVA with residual right-sided weakness/hemiparesis Continue statin, risk factor modification, PT/OT.  Patient with subtherapeutic INR today at 1.3.  Patient with history of CVA as well as PFO and A. fib.  Coumadin per pharmacy.  Place on a Lovenox bridge until INR is therapeutic.  DVT prophylaxis: Coumadin per pharmacy Code Status: DNR Family Communication: Updated patient.  No family at bedside. Disposition:   Status is: Inpatient    Dispo: The patient is from: SNF              Anticipated d/c is to: Back to SNF              Anticipated d/c date is: Hopefully 1 to 2 days.              Patient currently on IV antibiotics, improving clinically,On bipap nightly and as needed.  Not stable for discharge.       Consultants:   PCCM admission  Significant Diagnostic Tests:  ECHO 08/24/19 >> EF 60-65%. Normal LV and RV function. No significant valve abnormalities. RVSP 29.15mmHg. CT Head 9/13 >> no acute abnormalities CTA Chest 9/13 >> No PE. Respiratory Motion artifact. Small bilateral pleural effusions and dependent atelectasis.  Abdominal ultrasound 02/20/2020  Micro Data:  COVID 9/13 >> negative  BCx2 9/13 >> Urine 9/13 >>  Significant Hospital Events   9/13 Admit with N/V, AMS in setting of hypercarbic resp fx, Bipap started in ED   9/14 Mental status resolved, appears to be at baseline       Antimicrobials:  Cefepime 9/13 >> 02/22/2020 Vancomycin 9/13 >> x 1 dose Rocephin  02/22/2020>>>>   Subjective: Patient laying in bed.  States he is feeling better.  Noted not to be able to tolerate BiPAP overnight as he stated was uncomfortable.  Denies any chest pain.  Stated had a bowel movement.   Objective: Vitals:   02/22/20 2207 02/23/20 0521 02/23/20 0820 02/23/20 1040  BP: 123/73 130/68 (!) 156/99   Pulse: 68 67 83   Resp: 16 20    Temp: 97.9 F (36.6 C) 98.4 F (36.9 C) 98.3 F (36.8 C)   TempSrc: Oral Oral Oral   SpO2: 93% 94% 93% 94%  Weight:  (!) 142.1 kg    Height:        Intake/Output Summary (Last 24 hours) at 02/23/2020 1142 Last data filed at 02/23/2020 0821 Gross per 24 hour  Intake 600 ml  Output 2000 ml  Net -1400 ml   Filed Weights   02/21/20 0500 02/22/20 0500 02/23/20 0521  Weight: 135 kg 135 kg (!) 142.1 kg    Examination:  General exam: Chronically ill-appearing. Respiratory system: Some coarse breath sounds in the right base.  Normal respiratory effort.  Speaking in full sentences.  Cardiovascular system: Regular rate rhythm no murmurs rubs or gallops.  No JVD.  1-2+ bilateral lower extremity edema right greater than left.  Gastrointestinal system: Abdomen is obese, nondistended, soft, nontender to palpation.  No rebound.  No guarding. Central nervous system: Alert and oriented.  Right hemiparesis. Extremities: Symmetric 5 x 5 power. Skin: No rashes, lesions or ulcers Psychiatry: Judgement and insight appear normal. Mood & affect appropriate.     Data Reviewed: I have personally reviewed following labs and imaging studies  CBC: Recent Labs  Lab 02/20/20 1002 02/20/20 1018 02/21/20 0253 02/22/20 0245 02/23/20 0512  WBC 12.3*  --  10.7* 9.4 11.1*  NEUTROABS 9.8*  --   --   --  8.2*  HGB 14.6 15.3 13.8 13.8 14.4  HCT 46.0 45.0 42.5 43.9 43.2  MCV 97.5  --  95.7 97.6 93.9  PLT 239  --  164 151 195    Basic Metabolic Panel: Recent Labs  Lab 02/20/20 1002 02/20/20 1018 02/21/20 0253 02/22/20 0245  02/23/20 0512  NA 134* 132* 135 138 138  K 6.1* 5.8* 4.5 3.8 3.9  CL 92* 93* 93* 94* 99  CO2 29  --  34* 33* 30  GLUCOSE 144* 138* 98 88 94  BUN 22 23 22 13 10   CREATININE 1.25* 1.30* 0.81 0.60* 0.51*  CALCIUM 8.5*  --  8.6* 8.4* 8.7*  MG  --   --  2.1  --  2.1  PHOS  --   --  3.7  --   --     GFR: Estimated Creatinine Clearance: 143.7 mL/min (A) (by C-G formula based on SCr of 0.51 mg/dL (L)).  Liver Function Tests: Recent Labs  Lab 02/20/20 1002  AST 136*  ALT 124*  ALKPHOS 98  BILITOT 0.4  PROT 6.7  ALBUMIN 3.4*    CBG: No results for input(s): GLUCAP in the last 168 hours.   Recent Results (from the past 240 hour(s))  Blood culture (routine single)     Status: None (Preliminary result)   Collection Time: 02/20/20 10:02 AM   Specimen: BLOOD  Result Value Ref Range Status   Specimen Description   Final    BLOOD LEFT ANTECUBITAL Performed at [facility], [address] [place], [place] [address]    Special Requests   Final    BOTTLES DRAWN AEROBIC AND ANAEROBIC Blood Culture adequate volume Performed at [facility], [address] [place], [place] [address]    Culture   Final    NO GROWTH 2 DAYS Performed at [facility], [address] [place], [place] [address]    Report Status PENDING  Incomplete  Urine culture     Status: Abnormal   Collection Time: 02/20/20 10:02 AM   Specimen: In/Out Cath Urine  Result Value Ref Range Status   Specimen Description   Final    IN/OUT CATH URINE Performed at [facility], [address] [place], [place] [address]    Special Requests   Final    NONE Performed at [facility], [address] [place], [place] [address]    Culture 60,000 COLONIES/mL KLEBSIELLA PNEUMONIAE (A)  Final   Report Status 02/22/2020 FINAL  Final   Organism ID, Bacteria KLEBSIELLA PNEUMONIAE (A)  Final      Susceptibility  Klebsiella pneumoniae - MIC*     AMPICILLIN >=32 RESISTANT Resistant     CEFAZOLIN <=4 SENSITIVE Sensitive     CEFTRIAXONE <=0.25 SENSITIVE Sensitive     CIPROFLOXACIN <=0.25 SENSITIVE Sensitive     GENTAMICIN <=1 SENSITIVE Sensitive     IMIPENEM <=0.25 SENSITIVE Sensitive     NITROFURANTOIN 64 INTERMEDIATE Intermediate     TRIMETH/SULFA <=20 SENSITIVE Sensitive     AMPICILLIN/SULBACTAM 8 SENSITIVE Sensitive     PIP/TAZO <=4 SENSITIVE Sensitive     * 60,000 COLONIES/mL KLEBSIELLA PNEUMONIAE  SARS Coronavirus 2 by RT PCR (hospital order, performed in [facility] hospital lab) Nasopharyngeal Nasopharyngeal Swab     Status: None   Collection Time: 02/20/20 10:02 AM   Specimen: Nasopharyngeal Swab  Result Value Ref Range Status   SARS Coronavirus 2 NEGATIVE NEGATIVE Final    Comment: (NOTE) SARS-CoV-2 target nucleic acids are NOT DETECTED.  The SARS-CoV-2 RNA is generally detectable in upper and lower respiratory specimens during the acute phase of infection. The lowest concentration of SARS-CoV-2 viral copies this assay can detect is 250 copies / mL. A negative result does not preclude SARS-CoV-2 infection and should not be used as the sole basis for treatment or other patient management decisions.  A negative result may occur with improper specimen collection / handling, submission of specimen other than nasopharyngeal swab, presence of viral mutation(s) within the areas targeted by this assay, and inadequate number of viral copies (<250 copies / mL). A negative result must be combined with clinical observations, patient history, and epidemiological information.  Fact Sheet for Patients:   BoilerBrush.com.cy  Fact Sheet for Healthcare Providers: https://pope.com/  This test is not yet approved or  cleared by the [place] FDA and has been authorized for detection and/or diagnosis of SARS-CoV-2 by FDA under an Emergency Use Authorization (EUA).  This EUA will  remain in effect (meaning this test can be used) for the duration of the COVID-19 declaration under Section 564(b)(1) of the Act, 21 U.S.C. section 360bbb-3(b)(1), unless the authorization is terminated or revoked sooner.  Performed at [facility], [address] [place], [place] [address]   MRSA PCR Screening     Status: None   Collection Time: 02/20/20  8:43 PM   Specimen: Nasal Mucosa; Nasopharyngeal  Result Value Ref Range Status   MRSA by PCR NEGATIVE NEGATIVE Final    Comment:        The GeneXpert MRSA Assay (FDA approved for NASAL specimens only), is one component of a comprehensive MRSA colonization surveillance program. It is not intended to diagnose MRSA infection nor to guide or monitor treatment for MRSA infections. Performed at [facility], [address], [place], [place] [address]          Radiology Studies: No results found.      Scheduled Meds: . chlorhexidine  15 mL Mouth Rinse BID  . chlorhexidine  15 mL Mouth Rinse BID  . Chlorhexidine Gluconate Cloth  6 each Topical Daily  . cycloSPORINE  1 drop Both Eyes BID  . ezetimibe  10 mg Oral Daily  . furosemide  40 mg Oral BID  . ipratropium-albuterol  3 mL Nebulization BID  . mouth rinse  15 mL Mouth Rinse q12n4p  . mouth rinse  15 mL Mouth Rinse q12n4p  . metoprolol tartrate  12.5 mg Oral BID  . omega-3 acid ethyl esters  1 g Oral BID  . pantoprazole  40 mg Oral Daily  .  phenytoin  200 mg Oral BID  . polyvinyl alcohol  1 drop Both Eyes QID  . potassium chloride  20 mEq Oral Daily  . rosuvastatin  40 mg Oral QHS  . spironolactone  25 mg Oral Daily  . Warfarin - Pharmacist Dosing Inpatient   Does not apply q1600   Continuous Infusions: . sodium chloride Stopped (02/22/20 0712)  . cefTRIAXone (ROCEPHIN)  IV Stopped (02/22/20 1614)     LOS: 3 days    Time spent: 40 minutes    Ramiro Harvest, MD [facility]   To contact the attending  provider between 7A-7P or the covering provider during after hours 7P-7A, please log into the web site [email or web address] and access using universal [place] password for that web site. If you do not have the password, please call the hospital operator.  02/23/2020, 11:42 AM

## 2020-02-24 LAB — CBC
HCT: 43.3 % (ref 39.0–52.0)
Hemoglobin: 13.9 g/dL (ref 13.0–17.0)
MCH: 30.6 pg (ref 26.0–34.0)
MCHC: 32.1 g/dL (ref 30.0–36.0)
MCV: 95.4 fL (ref 80.0–100.0)
Platelets: 199 10*3/uL (ref 150–400)
RBC: 4.54 MIL/uL (ref 4.22–5.81)
RDW: 13.7 % (ref 11.5–15.5)
WBC: 10.1 10*3/uL (ref 4.0–10.5)
nRBC: 0 % (ref 0.0–0.2)

## 2020-02-24 LAB — BASIC METABOLIC PANEL
Anion gap: 10 (ref 5–15)
BUN: 9 mg/dL (ref 8–23)
CO2: 28 mmol/L (ref 22–32)
Calcium: 8.8 mg/dL — ABNORMAL LOW (ref 8.9–10.3)
Chloride: 100 mmol/L (ref 98–111)
Creatinine, Ser: 0.52 mg/dL — ABNORMAL LOW (ref 0.61–1.24)
GFR calc Af Amer: >60 mL/min (ref >60)
GFR calc non Af Amer: >60 mL/min (ref >60)
Glucose, Bld: 95 mg/dL (ref 70–99)
Potassium: 4.1 mmol/L (ref 3.5–5.1)
Sodium: 138 mmol/L (ref 135–145)

## 2020-02-24 LAB — PROTIME-INR
INR: 1.2 (ref 0.8–1.2)
Prothrombin Time: 14.8 seconds (ref 11.4–15.2)

## 2020-02-24 MED ORDER — VENLAFAXINE HCL 75 MG PO TABS
75.0000 mg | ORAL_TABLET | Freq: Every day | ORAL | Status: DC
  Administered 2020-02-24 – 2020-02-27 (×4): 75 mg via ORAL
  Filled 2020-02-24 (×4): qty 1

## 2020-02-24 MED ORDER — WARFARIN SODIUM 5 MG PO TABS
5.0000 mg | ORAL_TABLET | Freq: Once | ORAL | Status: AC
  Administered 2020-02-24: 5 mg via ORAL
  Filled 2020-02-24: qty 1

## 2020-02-24 MED ORDER — GABAPENTIN 300 MG PO CAPS
300.0000 mg | ORAL_CAPSULE | Freq: Three times a day (TID) | ORAL | Status: DC
  Administered 2020-02-24 – 2020-02-28 (×13): 300 mg via ORAL
  Filled 2020-02-24 (×13): qty 1

## 2020-02-24 NOTE — Plan of Care (Signed)
°  Problem: Nutrition: °Goal: Adequate nutrition will be maintained °Outcome: Progressing °  °Problem: Coping: °Goal: Level of anxiety will decrease °Outcome: Progressing °  °Problem: Safety: °Goal: Ability to remain free from injury will improve °Outcome: Progressing °  °

## 2020-02-24 NOTE — Progress Notes (Signed)
PROGRESS NOTE    James Villegas  TDD:[id number] DOB: [birth year] DOA: 02/20/2020 PCP: [name], MD    Chief Complaint  Patient presents with  . 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  N/V x3 days, O2 sats 49% on EMS evaluation.  Work up consistent with AKI, hyperkalemia, and acute hypercarbic respiratory failure  CT head did not show acute pathology. CTA chest did not indicate pulmonary emboli. Small dependent bilateral pleural effusions. Patchy ground glass areas, scattered. EKG shows atrial fibrillation or flutter, inverted T waves in leads V1-V5 similar to prior EKG 07/2019.    Assessment & Plan:   Principal Problem:   Acute respiratory failure (HCC) Active Problems:   Essential hypertension, benign   CAP (community acquired pneumonia)   Obesity, Class III, BMI 40-49.9 (morbid obesity) (HCC)   Bradycardia   Hyperkalemia   Hypercapnic respiratory failure (HCC)   Pressure injury of skin   AKI (acute kidney injury) (HCC)   HCAP (healthcare-associated pneumonia)   Acute metabolic encephalopathy   Hypercoagulable state (HCC)   Seizure disorder (HCC)   Chronic diastolic CHF (congestive heart failure) (HCC)   Atrial fibrillation, chronic (HCC)   History of CVA (cerebrovascular accident)   Hyperlipidemia   1  Acute hypoxemic and hypercapnic respiratory failure, likely secondary to underlying obstructive sleep apnea, component of obesity hypoventilation syndrome, concern for pneumonia, POA Patient admitted to critical care service on admission, due to severe hypoxemia and hypercapnia and hypercapnia.  CTA negative for PE, concern for possible pneumonia on chest x-ray.  Patient noted to have supratherapeutic INR and as such less likely  secondary to an acute PE.  Patient with clinical improvement on BiPAP.  Patient received a dose of Lasix 40 mg IV x1 on 02/21/2020 with a urine output of 1.8 L.  Patient received another dose of Lasix 40 mg IV x1 (02/22/2020 ) with a urine output of 2 L.  Home regimen Lasix spironolactone resumed with a urine output of 3.4 L over the past 24 hours.  Urine Legionella antigen negative.  Urine pneumococcus antigen negative.  Patient was on IV vancomycin, IV cefepime.  IV vancomycin discontinued.  Patient improved clinically and as such IV cefepime was narrowed to IV Rocephin for total of 5 days worth of treatment.  Discontinue IV Rocephin after today's dose.  Continue duo nebs.  BiPAP nightly and as needed.  PCCM/pulmonary arranging outpatient sleep study for BiPAP at facility with outpatient follow-up.  Tolerating current diet. Follow.   2.  Acute metabolic encephalopathy Likely secondary to problem #1.  CT head negative.  UDS negative.  TSH within normal limits.  Vitamin B12 within normal limits.  Phenytoin level normal.  Clinical improvement.  Continue BiPAP as noted above.  Limit sedation medications.  Resume home regimen gabapentin, Effexor.  Continue to hold trazodone for now.  Follow.    3.  Acute kidney injury Secondary to a prerenal azotemia in the setting of poor oral intake, emesis.  Urinalysis done with small leukocytes, nitrite negative, 21-50 WBCs.  Renal function improved creatinine down to 0.60.  Patient received a dose of Lasix 40 mg IV x1(02/21/2020) with a urine output of 1.8 L.  40 mg IV Lasix x1 given (02/22/2020 ) with a urine output of 2 L.  Renal function improved.  Home regimen Lasix and spironolactone resumed with a urine output of 3.4 L over the past 24 hours.  Renal function stable.  Strict I's and O's.  Daily weights.    4.  Seizure disorder Stable.  Continue current dose of phenytoin.  5.  Hypercoagulable state INR noted to be supratherapeutic on admission however currently  subtherapeutic.  INR at 1.2.  Continue Lovenox bridge.  Coumadin per pharmacy.    6.  Chronic diastolic heart failure/chronic A. fib/A-flutter/history of PFO No noted closure of PFO per cards last note.  Continue home regimen Lopressor.  Patient status post Lasix 40 mg IV x1 (02/22/2020 ) with a urine output of 2 L.  Home regimen oral Lasix and spinal lactone resumed 02/23/2020 with a urine output of 3.4 L over the past 24 hours.  INR subtherapeutic at 1.2 from 1.3 from 2.1.  Continue Lovenox bridge until INR is therapeutic due to history of CVA, A. fib, history of PFO.  Coumadin per pharmacy.  7.  Hyperlipidemia Continue statin, Lovaza.   8.  History of CVA with residual right-sided weakness/hemiparesis Continue statin, risk factor modification, PT/OT.  Patient with subtherapeutic INR today at 1.2.  Patient with history of CVA as well as PFO and A. fib.  Continue Lovenox bridge.  Coumadin per pharmacy.   DVT prophylaxis: Full dose Lovenox/Coumadin per pharmacy Code Status: DNR Family Communication: Updated patient.  No family at bedside. Disposition:   Status is: Inpatient    Dispo: The patient is from: SNF              Anticipated d/c is to: SNF              Anticipated d/c date is: Hopefully 1 to 2 days.              Patient currently on IV antibiotics, improving clinically,On bipap nightly and as needed.  Not stable for discharge.       Consultants:   PCCM admission  Significant Diagnostic Tests:  ECHO 08/24/19 >> EF 60-65%. Normal LV and RV function. No significant valve abnormalities. RVSP 29.7mmHg. CT Head 9/13 >> no acute abnormalities CTA Chest 9/13 >> No PE. Respiratory Motion artifact. Small bilateral pleural effusions and dependent atelectasis.  Abdominal ultrasound 02/20/2020  Micro Data:  COVID 9/13 >> negative  BCx2 9/13 >> Urine 9/13 >>  Significant Hospital Events   9/13 Admit with N/V, AMS in setting of hypercarbic resp fx, Bipap started in ED   9/14 Mental  status resolved, appears to be at baseline       Antimicrobials:  Cefepime 9/13 >> 02/22/2020 Vancomycin 9/13 >> x 1 dose Rocephin 02/22/2020>>>> 02/24/2020   Subjective: Patient in bed.  States shortness of breath is improving.  Denies any chest pain.  Denies any abdominal pain.  Noted to have been unable to tolerate BiPAP overnight and refused it.  Stating that he does not want to go back to the SNF facility he was at and would like a different facility.   Objective: Vitals:   02/24/20 0602 02/24/20 0829 02/24/20 1024 02/24/20 1206  BP: 119/82  112/75 127/74  Pulse: (!) 53  86 73  Resp: 20   20  Temp: 98.3 F (36.8 C)   99.4 F (37.4 C)  TempSrc: Oral   Oral  SpO2: 95% 96%  93%  Weight:      Height:        Intake/Output Summary (Last 24 hours) at 02/24/2020 1231  Last data filed at 02/24/2020 0000 Gross per 24 hour  Intake 240 ml  Output 1900 ml  Net -1660 ml   Filed Weights   02/21/20 0500 02/22/20 0500 02/23/20 0521  Weight: 135 kg 135 kg (!) 142.1 kg    Examination:  General exam: Chronically ill-appearing. Respiratory system: Decreased and coarse breath sounds in the right base.  No crackles noted.  No wheezing.  Fair air movement.  Speaking in full sentences.  Cardiovascular system: RRR no murmurs rubs or gallops.  No JVD.  1+ bilateral pedal edema/lower extremity edema right greater than left.. Gastrointestinal system: Abdomen is obese, soft, nondistended, nontender to palpation.  No rebound.  No guarding.  Positive bowel sounds.  Central nervous system: Alert and oriented.  Right hemiparesis. Extremities: Symmetric 5 x 5 power. Skin: No rashes, lesions or ulcers Psychiatry: Judgement and insight appear normal. Mood & affect appropriate.     Data Reviewed: I have personally reviewed following labs and imaging studies  CBC: Recent Labs  Lab 02/20/20 1002 02/20/20 1002 02/20/20 1018 02/21/20 0253 02/22/20 0245 02/23/20 0512 02/24/20 0510  WBC 12.3*   --   --  10.7* 9.4 11.1* 10.1  NEUTROABS 9.8*  --   --   --   --  8.2*  --   HGB 14.6   < > 15.3 13.8 13.8 14.4 13.9  HCT 46.0   < > 45.0 42.5 43.9 43.2 43.3  MCV 97.5  --   --  95.7 97.6 93.9 95.4  PLT 239  --   --  164 151 195 199   < > = values in this interval not displayed.    Basic Metabolic Panel: Recent Labs  Lab 02/20/20 1002 02/20/20 1002 02/20/20 1018 02/21/20 0253 02/22/20 0245 02/23/20 0512 02/24/20 0510  NA 134*   < > 132* 135 138 138 138  K 6.1*   < > 5.8* 4.5 3.8 3.9 4.1  CL 92*   < > 93* 93* 94* 99 100  CO2 29  --   --  34* 33* 30 28  GLUCOSE 144*   < > 138* 98 88 94 95  BUN 22   < > CREATININE 1.25*   < > 1.30* 0.81 0.60* 0.51* 0.52*  CALCIUM 8.5*  --   --  8.6* 8.4* 8.7* 8.8*  MG  --   --   --  2.1  --  2.1  --   PHOS  --   --   --  3.7  --   --   --    < > = values in this interval not displayed.    GFR: Estimated Creatinine Clearance: 143.7 mL/min (A) (by C-G formula based on SCr of 0.52 mg/dL (L)).  Liver Function Tests: Recent Labs  Lab 02/20/20 1002  AST 136*  ALT 124*  ALKPHOS 98  BILITOT 0.4  PROT 6.7  ALBUMIN 3.4*    CBG: No results for input(s): GLUCAP in the last 168 hours.   Recent Results (from the past 240 hour(s))  Blood culture (routine single)     Status: None (Preliminary result)   Collection Time: 02/20/20 10:02 AM   Specimen: BLOOD  Result Value Ref Range Status   Specimen Description   Final    BLOOD LEFT ANTECUBITAL Performed at [facility], [address] [place], [place] [address]    Special Requests   Final    BOTTLES DRAWN AEROBIC AND ANAEROBIC Blood  Culture adequate volume Performed at [facility], [address] [place], [place] [address]    Culture   Final    NO GROWTH 4 DAYS Performed at [facility], [address], [place], [place] [address]    Report Status PENDING  Incomplete  Urine culture     Status: Abnormal   Collection Time:  02/20/20 10:02 AM   Specimen: In/Out Cath Urine  Result Value Ref Range Status   Specimen Description   Final    IN/OUT CATH URINE Performed at [facility], [address] [place], [place] [address]    Special Requests   Final    NONE Performed at [facility], [address], [place], [place] [address]    Culture 60,000 COLONIES/mL KLEBSIELLA PNEUMONIAE (A)  Final   Report Status 02/22/2020 FINAL  Final   Organism ID, Bacteria KLEBSIELLA PNEUMONIAE (A)  Final      Susceptibility   Klebsiella pneumoniae - MIC*    AMPICILLIN >=32 RESISTANT Resistant     CEFAZOLIN <=4 SENSITIVE Sensitive     CEFTRIAXONE <=0.25 SENSITIVE Sensitive     CIPROFLOXACIN <=0.25 SENSITIVE Sensitive     GENTAMICIN <=1 SENSITIVE Sensitive     IMIPENEM <=0.25 SENSITIVE Sensitive     NITROFURANTOIN 64 INTERMEDIATE Intermediate     TRIMETH/SULFA <=20 SENSITIVE Sensitive     AMPICILLIN/SULBACTAM 8 SENSITIVE Sensitive     PIP/TAZO <=4 SENSITIVE Sensitive     * 60,000 COLONIES/mL KLEBSIELLA PNEUMONIAE  SARS Coronavirus 2 by RT PCR (hospital order, performed in [facility] hospital lab) Nasopharyngeal Nasopharyngeal Swab     Status: None   Collection Time: 02/20/20 10:02 AM   Specimen: Nasopharyngeal Swab  Result Value Ref Range Status   SARS Coronavirus 2 NEGATIVE NEGATIVE Final    Comment: (NOTE) SARS-CoV-2 target nucleic acids are NOT DETECTED.  The SARS-CoV-2 RNA is generally detectable in upper and lower respiratory specimens during the acute phase of infection. The lowest concentration of SARS-CoV-2 viral copies this assay can detect is 250 copies / mL. A negative result does not preclude SARS-CoV-2 infection and should not be used as the sole basis for treatment or other patient management decisions.  A negative result may occur with improper specimen collection / handling, submission of specimen other than nasopharyngeal swab, presence of viral mutation(s) within  the areas targeted by this assay, and inadequate number of viral copies (<250 copies / mL). A negative result must be combined with clinical observations, patient history, and epidemiological information.  Fact Sheet for Patients:   BoilerBrush.com.cy  Fact Sheet for Healthcare Providers: https://pope.com/  This test is not yet approved or  cleared by the [place] FDA and has been authorized for detection and/or diagnosis of SARS-CoV-2 by FDA under an Emergency Use Authorization (EUA).  This EUA will remain in effect (meaning this test can be used) for the duration of the COVID-19 declaration under Section 564(b)(1) of the Act, 21 U.S.C. section 360bbb-3(b)(1), unless the authorization is terminated or revoked sooner.  Performed at [facility], [address], [place], [place] [address]   MRSA PCR Screening     Status: None   Collection Time: 02/20/20  8:43 PM   Specimen: Nasal Mucosa; Nasopharyngeal  Result Value Ref Range Status   MRSA by PCR NEGATIVE NEGATIVE Final    Comment:        The GeneXpert MRSA Assay (FDA approved for NASAL specimens only), is one component of  a comprehensive MRSA colonization surveillance program. It is not intended to diagnose MRSA infection nor to guide or monitor treatment for MRSA infections. Performed at [facility], [address] [place], [place] [address]          Radiology Studies: No results found.      Scheduled Meds: . chlorhexidine  15 mL Mouth Rinse BID  . chlorhexidine  15 mL Mouth Rinse BID  . Chlorhexidine Gluconate Cloth  6 each Topical Daily  . cycloSPORINE  1 drop Both Eyes BID  . enoxaparin (LOVENOX) injection  140 mg Subcutaneous Q12H  . ezetimibe  10 mg Oral Daily  . furosemide  40 mg Oral BID  . gabapentin  300 mg Oral TID  . ipratropium-albuterol  3 mL Nebulization BID  . mouth rinse  15 mL Mouth Rinse q12n4p    . mouth rinse  15 mL Mouth Rinse q12n4p  . metoprolol tartrate  12.5 mg Oral BID  . omega-3 acid ethyl esters  1 g Oral BID  . pantoprazole  40 mg Oral Daily  . phenytoin  200 mg Oral BID  . polyvinyl alcohol  1 drop Both Eyes QID  . potassium chloride  20 mEq Oral Daily  . rosuvastatin  40 mg Oral QHS  . spironolactone  25 mg Oral Daily  . venlafaxine  75 mg Oral QHS  . Warfarin - Pharmacist Dosing Inpatient   Does not apply q1600   Continuous Infusions: . sodium chloride Stopped (02/23/20 1502)  . cefTRIAXone (ROCEPHIN)  IV Stopped (02/23/20 1459)     LOS: 4 days    Time spent: 40 minutes    Ramiro Harvestaniel Benna Arno, MD [facility]   To contact the attending provider between 7A-7P or the covering provider during after hours 7P-7A, please log into the web site [email or web address] and access using universal [place] password for that web site. If you do not have the password, please call the hospital operator.  02/24/2020, 12:31 PM

## 2020-02-24 NOTE — Progress Notes (Signed)
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 Fenofibrate Other (See Comments)    Per [facility]    Patient Measurements: Height: 6\' 1"  (185.4 cm) Weight: (!) 142.1 kg (313 lb 4.8 oz) IBW/kg (Calculated) : 79.9  Vital Signs: Temp: 99.4 F (37.4 C) (09/17 1206) Temp Source: Oral (09/17 1206) BP: 127/74 (09/17 1206) Pulse Rate: 73 (09/17 1206)  Labs: Recent Labs    02/22/20 0245 02/22/20 0245 02/23/20 0512 02/24/20 0510  HGB 13.8   < > 14.4 13.9  HCT 43.9  --  43.2 43.3  PLT 151  --  195 199  LABPROT 22.8*  --  15.3* 14.8  INR 2.1*  --  1.3* 1.2  CREATININE 0.60*  --  0.51* 0.52*   < > = values in this interval not displayed.    Estimated Creatinine Clearance: 143.7 mL/min (A) (by C-G formula based on SCr of 0.52 mg/dL (L)).   Medications:  Scheduled:  . Chlorhexidine Gluconate Cloth  6 each Topical Daily  . cycloSPORINE  1 drop Both Eyes BID  . enoxaparin (LOVENOX) injection  140 mg Subcutaneous Q12H  . ezetimibe  10 mg Oral Daily  . furosemide  40 mg Oral BID  . gabapentin  300 mg Oral TID  . ipratropium-albuterol  3 mL Nebulization BID  . metoprolol tartrate  12.5 mg Oral BID  . omega-3 acid ethyl esters  1 g Oral BID  . pantoprazole  40 mg Oral Daily  . phenytoin  200 mg Oral BID  . polyvinyl alcohol  1 drop Both Eyes QID  . potassium chloride  20 mEq Oral Daily  . rosuvastatin  40 mg Oral QHS  . spironolactone  25 mg Oral Daily  . venlafaxine  75 mg Oral QHS  . Warfarin - Pharmacist Dosing Inpatient   Does not apply q1600   Infusions:  . sodium chloride Stopped (02/23/20 1502)  . cefTRIAXone (ROCEPHIN)  IV Stopped (02/23/20 1459)    Assessment: 8 yoM admitted on 9/13 with AMS, N/V, respiratory failure.  PMH includes hypercoagulable state on chronic warfarin anticoagulation. Admission INR was  supratherapeutic and warfarin was held. Pharmacy was consulted to resume warfarin when INR normalizes.  PTA warfarin 3.5 mg daily. LD on 9/12 at 1800. -Admission INR 4.3  Today, 02/24/2020:  INR remains low after holding warfarin  CBC: stable WNL  Diet: Regular; 100% of meal intake charted  No major new drug interactions noted. Noted that pt is on warfarin + phenytoin chronically   TBW 142 kg  SCr WNL, CrCl > 30  Goal of Therapy:  INR 2-3 Monitor platelets by anticoagulation protocol: Yes   Plan:   Warfarin 5 mg PO once today  LMWH 1 mg/kg subcutaneous q12h until INR >2  Daily INR  02/26/2020, PharmD, BCPS [phone] 02/24/2020, 1:49 PM

## 2020-02-24 NOTE — TOC Progression Note (Addendum)
Transition of Care [facility]) - Progression Note    Patient Details  Name: James Villegas MRN: [id number] Date of Birth: 06-Jul-1955  Transition of Care [facility]) CM/SW Contact  Zymire Turnbo, Olegario Messier, RN Phone Number: 02/24/2020, 11:57 AM  Clinical Narrative: Sherron Monday to patient/sister [name] per permission-both agree to looking into another SNF facility-has been at [facility] in past-TC to [place] to review fl2. Await bed offers.    2:22p-[facility] declined in hub;bed offers gien to sister [name] over [employer] in rm w/patient. Await choice.  Expected Discharge Plan: Skilled Nursing Facility (from [facility]) Barriers to Discharge: Continued Medical Work up  Expected Discharge Plan and Services Expected Discharge Plan: Skilled Nursing Facility (from [facility])   Discharge Planning Services: CM Consult Post Acute Care Choice: Skilled Nursing Facility Living arrangements for the past 2 months: Skilled Nursing Facility                                       Social Determinants of Health (SDOH) Interventions    Readmission Risk Interventions No flowsheet data found.

## 2020-02-25 LAB — CULTURE, BLOOD (SINGLE)
Culture: NO GROWTH
Special Requests: ADEQUATE

## 2020-02-25 LAB — BASIC METABOLIC PANEL
Anion gap: 9 (ref 5–15)
BUN: 14 mg/dL (ref 8–23)
CO2: 28 mmol/L (ref 22–32)
Calcium: 8.7 mg/dL — ABNORMAL LOW (ref 8.9–10.3)
Chloride: 103 mmol/L (ref 98–111)
Creatinine, Ser: 0.47 mg/dL — ABNORMAL LOW (ref 0.61–1.24)
GFR calc Af Amer: >60 mL/min (ref >60)
GFR calc non Af Amer: >60 mL/min (ref >60)
Glucose, Bld: 95 mg/dL (ref 70–99)
Potassium: 4.4 mmol/L (ref 3.5–5.1)
Sodium: 140 mmol/L (ref 135–145)

## 2020-02-25 LAB — PROTIME-INR
INR: 1.4 — ABNORMAL HIGH (ref 0.8–1.2)
Prothrombin Time: 16.9 seconds — ABNORMAL HIGH (ref 11.4–15.2)

## 2020-02-25 MED ORDER — TRAZODONE HCL 50 MG PO TABS
50.0000 mg | ORAL_TABLET | Freq: Once | ORAL | Status: AC
  Administered 2020-02-25: 50 mg via ORAL
  Filled 2020-02-25: qty 1

## 2020-02-25 MED ORDER — IPRATROPIUM-ALBUTEROL 0.5-2.5 (3) MG/3ML IN SOLN: 3.0000 mL | Freq: Four times a day (QID) | RESPIRATORY_TRACT | Status: DC | PRN

## 2020-02-25 MED ORDER — WARFARIN SODIUM 4 MG PO TABS
4.0000 mg | ORAL_TABLET | Freq: Once | ORAL | Status: AC
  Administered 2020-02-25: 4 mg via ORAL
  Filled 2020-02-25: qty 1

## 2020-02-25 NOTE — Progress Notes (Signed)
Pt has decided to give the BIPAP a try tonight, pt wants to go on at 2300.  RT to monitor and assess as needed.

## 2020-02-25 NOTE — TOC Progression Note (Addendum)
Transition of Care [facility]) - Progression Note    Patient Details  Name: James Villegas MRN: [id number] Date of Birth: 25-Nov-1955  Transition of Care [facility]) CM/SW Contact  Coralyn Helling, [place] Phone Number: 02/25/2020, 11:59 AM  Clinical Narrative:     Patient DOES NOT have a bed. [facility] does not have any LTC beds.   Sister and patient chose [facility]. LCSW awaiting repose from facility to see when pt can be admitted.    Expected Discharge Plan: Skilled Nursing Facility (from [facility]) Barriers to Discharge: Continued Medical Work up  Expected Discharge Plan and Services Expected Discharge Plan: Skilled Nursing Facility (from [place])   Discharge Planning Services: CM Consult Post Acute Care Choice: Skilled Nursing Facility Living arrangements for the past 2 months: Skilled Nursing Facility                                       Social Determinants of Health (SDOH) Interventions    Readmission Risk Interventions No flowsheet data found.

## 2020-02-25 NOTE — TOC Progression Note (Signed)
Transition of Care [facility]) - Progression Note    Patient Details  Name: MARSHAWN NORMOYLE MRN: [id number] Date of Birth: 1955-12-19  Transition of Care [facility]) CM/SW Contact  Coralyn Helling, [place] Phone Number: 02/25/2020, 12:37 PM  Clinical Narrative:   Patient and sister agreeable to return to [facility] for LTC at dc.    Expected Discharge Plan: Skilled Nursing Facility (from [facility]) Barriers to Discharge: Continued Medical Work up  Expected Discharge Plan and Services Expected Discharge Plan: Skilled Nursing Facility (from [place])   Discharge Planning Services: CM Consult Post Acute Care Choice: Skilled Nursing Facility Living arrangements for the past 2 months: Skilled Nursing Facility                                       Social Determinants of Health (SDOH) Interventions    Readmission Risk Interventions No flowsheet data found.

## 2020-02-25 NOTE — Progress Notes (Signed)
Pt refuses BIPAP QHS.  RT to monitor and assess as needed.

## 2020-02-25 NOTE — Plan of Care (Signed)
  Problem: Nutrition: Goal: Adequate nutrition will be maintained Outcome: Progressing   Problem: Coping: Goal: Level of anxiety will decrease Outcome: Progressing   

## 2020-02-25 NOTE — Progress Notes (Signed)
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), atrial fibrillation  Allergies  Allergen Reactions   Fenofibrate Other (See Comments)    Per MAR    Patient Measurements: Height: 6\' 1"  (185.4 cm) Weight: (!) 137.9 kg (304 lb 0.2 oz) IBW/kg (Calculated) : 79.9  Vital Signs: Temp: 97.7 F (36.5 C) (09/18 0615) Temp Source: Oral (09/18 0615) BP: 124/87 (09/18 0615) Pulse Rate: 90 (09/18 0615)  Labs: Recent Labs    02/23/20 0512 02/24/20 0510 02/25/20 0546  HGB 14.4 13.9  --   HCT 43.2 43.3  --   PLT 195 199  --   LABPROT 15.3* 14.8 16.9*  INR 1.3* 1.2 1.4*  CREATININE 0.51* 0.52* 0.47*    Estimated Creatinine Clearance: 141.4 mL/min (A) (by C-G formula based on SCr of 0.47 mg/dL (L)).   Medications:  Scheduled:   cycloSPORINE  1 drop Both Eyes BID   enoxaparin (LOVENOX) injection  140 mg Subcutaneous Q12H   ezetimibe  10 mg Oral Daily   furosemide  40 mg Oral BID   gabapentin  300 mg Oral TID   ipratropium-albuterol  3 mL Nebulization BID   metoprolol tartrate  12.5 mg Oral BID   omega-3 acid ethyl esters  1 g Oral BID   pantoprazole  40 mg Oral Daily   phenytoin  200 mg Oral BID   polyvinyl alcohol  1 drop Both Eyes QID   rosuvastatin  40 mg Oral QHS   spironolactone  25 mg Oral Daily   venlafaxine  75 mg Oral QHS   Warfarin - Pharmacist Dosing Inpatient   Does not apply q1600   Infusions:   sodium chloride Stopped (02/23/20 1502)    Assessment: 63 yoM admitted on 9/13 with AMS, N/V, respiratory failure.  PMH includes hypercoagulable state on chronic warfarin anticoagulation. Admission INR was supratherapeutic and warfarin was held. Pharmacy was consulted to resume warfarin when INR normalizes.  PTA warfarin 3.5 mg daily. LD on 9/12 at 1800. -Admission INR 4.3  Today, 02/25/2020:  INR remains low after holding warfarin, but now  rising appropriately  CBC: stable WNL  Diet: Regular; eating 75-100% of usual intake  No major new drug interactions noted. Recent broad spectrum abx; also note pt on  phenytoin chronically   Goal of Therapy:  INR 2-3 Monitor platelets by anticoagulation protocol: Yes   Plan:   Warfarin 4 mg PO once today  LMWH 1 mg/kg subcutaneous q12h until INR >2  Daily INR  02/27/2020, PharmD, BCPS [phone] 02/25/2020, 10:18 AM

## 2020-02-25 NOTE — Progress Notes (Addendum)
PROGRESS NOTE    James Villegas  WUJ:[id number] DOB: March 09, 1956 DOA: 02/20/2020 PCP: [name], MD    Chief Complaint  Patient presents with  . 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 as he was found unresponsive.  N/V x3 days, O2 sats 49% on EMS evaluation.  Work up consistent with AKI, hyperkalemia, and acute hypercarbic respiratory failure  CT head did not show acute pathology. CTA chest did not indicate pulmonary emboli. Small dependent bilateral pleural effusions. Patchy ground glass areas, scattered. EKG shows atrial fibrillation or flutter, inverted T waves in leads V1-V5 similar to prior EKG 07/2019.    Assessment & Plan:   Principal Problem:   Acute respiratory failure (HCC) Active Problems:   Essential hypertension, benign   CAP (community acquired pneumonia)   Obesity, Class III, BMI 40-49.9 (morbid obesity) (HCC)   Bradycardia   Hyperkalemia   Hypercapnic respiratory failure (HCC)   Pressure injury of skin   AKI (acute kidney injury) (HCC)   HCAP (healthcare-associated pneumonia)   Acute metabolic encephalopathy   Hypercoagulable state (HCC)   Seizure disorder (HCC)   Chronic diastolic CHF (congestive heart failure) (HCC)   Atrial fibrillation, chronic (HCC)   History of CVA (cerebrovascular accident)   Hyperlipidemia   1  Acute hypoxemic and hypercapnic respiratory failure, likely secondary to underlying obstructive sleep apnea, component of obesity hypoventilation syndrome, concern for pneumonia, POA Patient admitted to critical care service on admission, due to severe hypoxemia and hypercapnia and hypercapnia.  CTA negative for PE, concern for possible pneumonia on chest x-ray.  Patient noted to have supratherapeutic INR and as such less likely  secondary to an acute PE.  Patient with clinical improvement on BiPAP.  Patient received a dose of Lasix 40 mg IV x1 on 02/21/2020 with a urine output of 1.8 L.  Patient received another dose of Lasix 40 mg IV x1 (02/22/2020 ) with a urine output of 2 L.  Home regimen Lasix spironolactone resumed with a urine output of 2.175 L over the past 24 hours.  Urine Legionella antigen negative.  Urine pneumococcus antigen negative.  Patient was on IV vancomycin, IV cefepime.  IV vancomycin discontinued.  Patient improved clinically and as such IV cefepime was narrowed to IV Rocephin for total of 5 days worth of treatment.  Status post 5 days IV antibiotics.  IV Rocephin discontinued 02/24/2020. Continue duo nebs.  BiPAP nightly and as needed.  PCCM/pulmonary arranging outpatient sleep study for BiPAP at facility with outpatient follow-up.  Tolerating current diet. Follow.   2.  Acute metabolic encephalopathy Likely secondary to problem #1.  CT head negative.  UDS negative.  TSH within normal limits.  Vitamin B12 within normal limits.  Phenytoin level normal.  Clinical improvement.  Likely close to baseline.  Continue BiPAP as noted above.  Limit sedation medications.  Resumed home regimen gabapentin, Effexor.  Continue to hold trazodone for now.  Follow.    3.  Acute kidney injury Secondary to a prerenal azotemia in the setting of poor oral intake, emesis.  Urinalysis done with small leukocytes, nitrite negative, 21-50 WBCs.  Renal function improved creatinine down to 0.60.  Patient received a dose of Lasix 40 mg IV x1(02/21/2020) with a urine output of 1.8 L.  40 mg IV Lasix x1 given (  02/22/2020 ) with a urine output of 2 L.  Renal function improved.  Home regimen Lasix and spironolactone resumed with a urine output of 2.175 L over the past 24 hours.  Renal function stable.  Strict I's and O's.  Daily weights.    4.  Seizure disorder Stable.  No seizures noted.  Continue phenytoin.   5.  Hypercoagulable state INR  noted to be supratherapeutic on admission however currently subtherapeutic.  INR at 1.4.  Continue Lovenox bridge.  Coumadin per pharmacy.    6.  Chronic diastolic heart failure/chronic A. fib/A-flutter/history of PFO No noted closure of PFO per cards last note.  Continue home regimen Lopressor.  Patient status post Lasix 40 mg IV x1 (02/22/2020 ) with a urine output of 2 L.  Home regimen oral Lasix and spinal lactone resumed 02/23/2020 with a urine output of 2.175 L over the past 24 hours.  INR subtherapeutic at 1.4 from 1.2 from 1.3 from 2.1.  Continue Lovenox bridge until INR is therapeutic due to history of CVA, A. fib, history of PFO.  Coumadin per pharmacy.  7.  Hyperlipidemia Continue statin, Lovaza.   8.  History of CVA with residual right-sided weakness/hemiparesis Continue statin, risk factor modification, PT/OT.  Patient with subtherapeutic INR slowly trending up now at 1.4. Patient with history of CVA as well as PFO and A. fib.  Continue Lovenox bridge.  Coumadin per pharmacy.  9.  Morbid obesity  DVT prophylaxis: Full dose Lovenox bridge/Coumadin per pharmacy Code Status: DNR Family Communication: Updated patient.  No family at bedside. Disposition:   Status is: Inpatient    Dispo: The patient is from: SNF              Anticipated d/c is to: SNF              Anticipated d/c date is: When bed available.               Patient currently on a Lovenox bridge with Coumadin.  Has completed antibiotics.       Consultants:   PCCM admission  Significant Diagnostic Tests:  ECHO 08/24/19 >> EF 60-65%. Normal LV and RV function. No significant valve abnormalities. RVSP 29.266mmHg. CT Head 9/13 >> no acute abnormalities CTA Chest 9/13 >> No PE. Respiratory Motion artifact. Small bilateral pleural effusions and dependent atelectasis.  Abdominal ultrasound 02/20/2020  Micro Data:  COVID 9/13 >> negative  BCx2 9/13 >> Urine 9/13 >>  Significant Hospital Events   9/13 Admit with  N/V, AMS in setting of hypercarbic resp fx, Bipap started in ED   9/14 Mental status resolved, appears to be at baseline       Antimicrobials:  Cefepime 9/13 >> 02/22/2020 Vancomycin 9/13 >> x 1 dose Rocephin 02/22/2020>>>> 02/24/2020   Subjective: Patient laying in bed.  Denies any significant shortness of breath.  No chest pain.  No abdominal pain.  Did not use BiPAP overnight.  Stated had a bowel movement this morning.   Objective: Vitals:   02/24/20 2030 02/24/20 2046 02/25/20 0500 02/25/20 0615  BP:  122/66  124/87  Pulse:  84  90  Resp:  20  20  Temp:  97.7 F (36.5 C)  97.7 F (36.5 C)  TempSrc:  Oral  Oral  SpO2: 97% 91%  96%  Weight:   (!) 137.9 kg   Height:        Intake/Output Summary (Last 24 hours) at 02/25/2020 1003 Last data filed at 02/25/2020 0420 Gross  per 24 hour  Intake --  Output 2175 ml  Net -2175 ml   Filed Weights   02/22/20 0500 02/23/20 0521 02/25/20 0500  Weight: 135 kg (!) 142.1 kg (!) 137.9 kg    Examination:  General exam: Chronically ill-appearing. Respiratory system: Clear to auscultation bilaterally anterior lung fields.  No wheezes, no crackles.  Fair air movement.  Speaking in full sentences.   Cardiovascular system: Irregularly irregular.  No JVD.  1+ bilateral lower extremity edema R > L. Gastrointestinal system: Abdomen is obese, soft, nontender, nondistended, positive bowel sounds.  No rebound.  No guarding.  Central nervous system: Alert and oriented.  Right hemiparesis. Extremities: Symmetric 5 x 5 power. Skin: No rashes, lesions or ulcers Psychiatry: Judgement and insight appear normal. Mood & affect appropriate.     Data Reviewed: I have personally reviewed following labs and imaging studies  CBC: Recent Labs  Lab 02/20/20 1002 02/20/20 1002 02/20/20 1018 02/21/20 0253 02/22/20 0245 02/23/20 0512 02/24/20 0510  WBC 12.3*  --   --  10.7* 9.4 11.1* 10.1  NEUTROABS 9.8*  --   --   --   --  8.2*  --   HGB 14.6    < > 15.3 13.8 13.8 14.4 13.9  HCT 46.0   < > 45.0 42.5 43.9 43.2 43.3  MCV 97.5  --   --  95.7 97.6 93.9 95.4  PLT 239  --   --  164 151 195 199   < > = values in this interval not displayed.    Basic Metabolic Panel: Recent Labs  Lab 02/21/20 0253 02/22/20 0245 02/23/20 0512 02/24/20 0510 02/25/20 0546  NA 135 138 138 138 140  K 4.5 3.8 3.9 4.1 4.4  CL 93* 94* 99 100 103  CO2 34* 33* GLUCOSE 98 88 94 95 95  BUN CREATININE 0.81 0.60* 0.51* 0.52* 0.47*  CALCIUM 8.6* 8.4* 8.7* 8.8* 8.7*  MG 2.1  --  2.1  --   --   PHOS 3.7  --   --   --   --     GFR: Estimated Creatinine Clearance: 141.4 mL/min (A) (by C-G formula based on SCr of 0.47 mg/dL (L)).  Liver Function Tests: Recent Labs  Lab 02/20/20 1002  AST 136*  ALT 124*  ALKPHOS 98  BILITOT 0.4  PROT 6.7  ALBUMIN 3.4*    CBG: No results for input(s): GLUCAP in the last 168 hours.   Recent Results (from the past 240 hour(s))  Blood culture (routine single)     Status: None (Preliminary result)   Collection Time: 02/20/20 10:02 AM   Specimen: BLOOD  Result Value Ref Range Status   Specimen Description   Final    BLOOD LEFT ANTECUBITAL Performed at [facility], [address], [place], [place] [address]    Special Requests   Final    BOTTLES DRAWN AEROBIC AND ANAEROBIC Blood Culture adequate volume Performed at [facility], [address] [place], [place] [address]    Culture   Final    NO GROWTH 4 DAYS Performed at [facility], [address] [place], [place] [address]    Report Status PENDING  Incomplete  Urine culture     Status: Abnormal   Collection Time: 02/20/20 10:02 AM   Specimen: In/Out Cath Urine  Result Value Ref Range Status   Specimen Description   Final  IN/OUT CATH URINE Performed at [facility], [address] [place], [place] [address]    Special Requests   Final    NONE Performed at  [facility], [address], [place], [place] [address]    Culture 60,000 COLONIES/mL KLEBSIELLA PNEUMONIAE (A)  Final   Report Status 02/22/2020 FINAL  Final   Organism ID, Bacteria KLEBSIELLA PNEUMONIAE (A)  Final      Susceptibility   Klebsiella pneumoniae - MIC*    AMPICILLIN >=32 RESISTANT Resistant     CEFAZOLIN <=4 SENSITIVE Sensitive     CEFTRIAXONE <=0.25 SENSITIVE Sensitive     CIPROFLOXACIN <=0.25 SENSITIVE Sensitive     GENTAMICIN <=1 SENSITIVE Sensitive     IMIPENEM <=0.25 SENSITIVE Sensitive     NITROFURANTOIN 64 INTERMEDIATE Intermediate     TRIMETH/SULFA <=20 SENSITIVE Sensitive     AMPICILLIN/SULBACTAM 8 SENSITIVE Sensitive     PIP/TAZO <=4 SENSITIVE Sensitive     * 60,000 COLONIES/mL KLEBSIELLA PNEUMONIAE  SARS Coronavirus 2 by RT PCR (hospital order, performed in [facility] hospital lab) Nasopharyngeal Nasopharyngeal Swab     Status: None   Collection Time: 02/20/20 10:02 AM   Specimen: Nasopharyngeal Swab  Result Value Ref Range Status   SARS Coronavirus 2 NEGATIVE NEGATIVE Final    Comment: (NOTE) SARS-CoV-2 target nucleic acids are NOT DETECTED.  The SARS-CoV-2 RNA is generally detectable in upper and lower respiratory specimens during the acute phase of infection. The lowest concentration of SARS-CoV-2 viral copies this assay can detect is 250 copies / mL. A negative result does not preclude SARS-CoV-2 infection and should not be used as the sole basis for treatment or other patient management decisions.  A negative result may occur with improper specimen collection / handling, submission of specimen other than nasopharyngeal swab, presence of viral mutation(s) within the areas targeted by this assay, and inadequate number of viral copies (<250 copies / mL). A negative result must be combined with clinical observations, patient history, and epidemiological information.  Fact Sheet for Patients:     BoilerBrush.com.cy  Fact Sheet for Healthcare Providers: https://pope.com/  This test is not yet approved or  cleared by the [place] FDA and has been authorized for detection and/or diagnosis of SARS-CoV-2 by FDA under an Emergency Use Authorization (EUA).  This EUA will remain in effect (meaning this test can be used) for the duration of the COVID-19 declaration under Section 564(b)(1) of the Act, 21 U.S.C. section 360bbb-3(b)(1), unless the authorization is terminated or revoked sooner.  Performed at [facility], [address], [place], [place] [address]   MRSA PCR Screening     Status: None   Collection Time: 02/20/20  8:43 PM   Specimen: Nasal Mucosa; Nasopharyngeal  Result Value Ref Range Status   MRSA by PCR NEGATIVE NEGATIVE Final    Comment:        The GeneXpert MRSA Assay (FDA approved for NASAL specimens only), is one component of a comprehensive MRSA colonization surveillance program. It is not intended to diagnose MRSA infection nor to guide or monitor treatment for MRSA infections. Performed at [facility], [address], [place], [place] [address]          Radiology Studies: No results found.      Scheduled Meds: . cycloSPORINE  1 drop Both Eyes BID  . enoxaparin (LOVENOX) injection  140 mg Subcutaneous Q12H  . ezetimibe  10 mg Oral Daily  . furosemide  40  mg Oral BID  . gabapentin  300 mg Oral TID  . ipratropium-albuterol  3 mL Nebulization BID  . metoprolol tartrate  12.5 mg Oral BID  . omega-3 acid ethyl esters  1 g Oral BID  . pantoprazole  40 mg Oral Daily  . phenytoin  200 mg Oral BID  . polyvinyl alcohol  1 drop Both Eyes QID  . rosuvastatin  40 mg Oral QHS  . spironolactone  25 mg Oral Daily  . venlafaxine  75 mg Oral QHS  . Warfarin - Pharmacist Dosing Inpatient   Does not apply q1600   Continuous Infusions: . sodium chloride Stopped  (02/23/20 1502)     LOS: 5 days    Time spent: 40 minutes    Ramiro Harvest, MD [facility]   To contact the attending provider between 7A-7P or the covering provider during after hours 7P-7A, please log into the web site [email or web address] and access using universal  password for that web site. If you do not have the password, please call the hospital operator.  02/25/2020, 10:03 AM

## 2020-02-26 LAB — PROTIME-INR
INR: 1.7 — ABNORMAL HIGH (ref 0.8–1.2)
Prothrombin Time: 18.9 seconds — ABNORMAL HIGH (ref 11.4–15.2)

## 2020-02-26 LAB — BASIC METABOLIC PANEL
Anion gap: 9 (ref 5–15)
BUN: 11 mg/dL (ref 8–23)
CO2: 32 mmol/L (ref 22–32)
Calcium: 8.8 mg/dL — ABNORMAL LOW (ref 8.9–10.3)
Chloride: 97 mmol/L — ABNORMAL LOW (ref 98–111)
Creatinine, Ser: 0.58 mg/dL — ABNORMAL LOW (ref 0.61–1.24)
GFR calc Af Amer: >60 mL/min (ref >60)
GFR calc non Af Amer: >60 mL/min (ref >60)
Glucose, Bld: 94 mg/dL (ref 70–99)
Potassium: 3.9 mmol/L (ref 3.5–5.1)
Sodium: 138 mmol/L (ref 135–145)

## 2020-02-26 MED ORDER — WARFARIN SODIUM 5 MG PO TABS
5.0000 mg | ORAL_TABLET | Freq: Once | ORAL | Status: AC
  Administered 2020-02-26: 5 mg via ORAL
  Filled 2020-02-26: qty 1

## 2020-02-26 MED ORDER — TRAZODONE HCL 50 MG PO TABS
50.0000 mg | ORAL_TABLET | Freq: Every day | ORAL | Status: DC
  Administered 2020-02-26 – 2020-02-27 (×2): 50 mg via ORAL
  Filled 2020-02-26 (×2): qty 1

## 2020-02-26 NOTE — Progress Notes (Deleted)
RT called due to pt's drop in SATs. MD at bedside. Pt SATs maintaining in 70s and was on a simple mask at 15L. Pt switched to a NRB flush on flow meter and SATs increased to 100%. WOB within normal limits. 

## 2020-02-26 NOTE — Progress Notes (Deleted)
Patients oxygen saturation dropped to 36% on 15L. Patient placed on a non-rebreather and MD notified. O2 saturations currently at 80% on non-rebreather.

## 2020-02-26 NOTE — Progress Notes (Signed)
PROGRESS NOTE    James Villegas  OIB:[id number] DOB: 1955/11/19 DOA: 02/20/2020 PCP: James Housekeeper, MD    Chief Complaint  Patient presents with  . 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  N/V x3 days, O2 sats 49% on EMS evaluation.  Work up consistent with AKI, hyperkalemia, and acute hypercarbic respiratory failure  CT head did not show acute pathology. CTA chest did not indicate pulmonary emboli. Small dependent bilateral pleural effusions. Patchy ground glass areas, scattered. EKG shows atrial fibrillation or flutter, inverted T waves in leads V1-V5 similar to prior EKG 07/2019.    Assessment & Plan:   Principal Problem:   Acute respiratory failure (HCC) Active Problems:   Essential hypertension, benign   CAP (community acquired pneumonia)   Obesity, Class III, BMI 40-49.9 (morbid obesity) (HCC)   Bradycardia   Hyperkalemia   Hypercapnic respiratory failure (HCC)   Pressure injury of skin   AKI (acute kidney injury) (HCC)   HCAP (healthcare-associated pneumonia)   Acute metabolic encephalopathy   Hypercoagulable state (HCC)   Seizure disorder (HCC)   Chronic diastolic CHF (congestive heart failure) (HCC)   Atrial fibrillation, chronic (HCC)   History of CVA (cerebrovascular accident)   Hyperlipidemia   1  Acute hypoxemic and hypercapnic respiratory failure, likely secondary to underlying obstructive sleep apnea, component of obesity hypoventilation syndrome, concern for pneumonia, POA Patient admitted to critical care service on admission, due to severe hypoxemia and hypercapnia and hypercapnia.  CTA negative for PE, concern for possible pneumonia on chest x-ray.  Patient noted to have supratherapeutic INR and as such less likely  secondary to an acute PE.  Patient with clinical improvement on BiPAP.  Patient received a dose of Lasix 40 mg IV x1 on 02/21/2020 with a urine output of 1.8 L.  Patient received another dose of Lasix 40 mg IV x1 (02/22/2020 ) with a urine output of 2 L.  Home regimen Lasix spironolactone resumed with a urine output of 2.8 L over the past 24 hours.  Urine Legionella antigen negative.  Urine pneumococcus antigen negative.  Patient was on IV vancomycin, IV cefepime.  IV vancomycin discontinued.  Patient improved clinically and as such IV cefepime was narrowed to IV Rocephin for total of 5 days worth of treatment.  Status post 5 days IV antibiotics.  IV Rocephin discontinued 02/24/2020. Continue duo nebs.  BiPAP nightly and as needed.  PCCM/pulmonary arranging outpatient sleep study for BiPAP at facility with outpatient follow-up.  Tolerating current diet. Follow.   2.  Acute metabolic encephalopathy Likely secondary to problem #1.  CT head negative.  UDS negative.  TSH within normal limits.  Vitamin B12 within normal limits.  Phenytoin level normal.  Clinical improvement.  Likely at baseline.  Continue BiPAP as noted above.  Limit sedation medications.  Gabapentin, Effexor resumed.  Will resume trazodone at 50 mg nightly which is patient's home dose tonight.  Patient stating usually has a snack with his trazodone otherwise develops nausea and some abdominal discomfort.  Follow.   3.  Acute kidney injury Secondary to a prerenal azotemia in the setting of poor oral intake, emesis.  Urinalysis done with small leukocytes, nitrite negative, 21-50 WBCs.  Renal function improved creatinine down to 0.60.  Patient received a dose  of Lasix 40 mg IV x1(02/21/2020) with a urine output of 1.8 L.  40 mg IV Lasix x1 given (02/22/2020 ) with a urine output of 2 L.  Renal function improved.  Home regimen Lasix and spironolactone resumed with a urine output of 2.8 L over the past 24 hours.  Renal function stable.  Strict I's and O's.   Daily weights.    4.  Seizure disorder Stable.  No seizures noted.  Phenytoin.    5.  Hypercoagulable state INR noted to be supratherapeutic on admission however currently subtherapeutic.  INR at 1.7.  Continue Lovenox bridge.  Coumadin per pharmacy.    6.  Chronic diastolic heart failure/chronic A. fib/A-flutter/history of PFO No noted closure of PFO per cards last note.  Continue home regimen Lopressor.  Patient status post Lasix 40 mg IV x1 (02/22/2020 ) with a urine output of 2 L.  Home regimen oral Lasix and spinal lactone resumed 02/23/2020 with a urine output of 2.8 L over the past 24 hours.  INR subtherapeutic at 1.7 from 1.4 from 1.2 from 1.3 from 2.1.  Continue Lovenox bridge until INR is therapeutic due to history of CVA, A. fib, history of PFO.  Coumadin per pharmacy.  7.  Hyperlipidemia Continue Lovaza, statin.   8.  History of CVA with residual right-sided weakness/hemiparesis Continue statin, risk factor modification, PT/OT.  Patient with subtherapeutic INR slowly trending up now at 1.7. Patient with history of CVA as well as PFO and A. fib.  Continue Lovenox bridge.  Coumadin per pharmacy.  9.  Morbid obesity  DVT prophylaxis: Full dose Lovenox bridge/Coumadin per pharmacy Code Status: DNR Family Communication: Updated patient.  No family at bedside. Disposition:   Status is: Inpatient    Dispo: The patient is from: SNF              Anticipated d/c is to: SNF              Anticipated d/c date is: When bed available, hopefully 1 to 2 days once INR is therapeutic.              Patient currently on a Lovenox bridge with Coumadin.  Has completed antibiotics.       Consultants:   PCCM admission  Significant Diagnostic Tests:  ECHO 08/24/19 >> EF 60-65%. Normal LV and RV function. No significant valve abnormalities. RVSP 29.796mmHg. CT Head 9/13 >> no acute abnormalities CTA Chest 9/13 >> No PE. Respiratory Motion artifact. Small bilateral pleural effusions and  dependent atelectasis.  Abdominal ultrasound 02/20/2020  Micro Data:  COVID 9/13 >> negative  BCx2 9/13 >> Urine 9/13 >>  Significant Hospital Events   9/13 Admit with N/V, AMS in setting of hypercarbic resp fx, Bipap started in ED   9/14 Mental status resolved, appears to be at baseline       Antimicrobials:  Cefepime 9/13 >> 02/22/2020 Vancomycin 9/13 >> x 1 dose Rocephin 02/22/2020>>>> 02/24/2020   Subjective: Patient laying in bed.  Denies chest pain or shortness of breath.  Stated he used the BiPAP overnight.  Asking whether his trazodone may be resumed as he is having difficulty sleeping.  Objective: Vitals:   02/26/20 0438 02/26/20 0500 02/26/20 1015 02/26/20 1025  BP: 113/89     Pulse: 92     Resp: 14  19 18   Temp: 97.9 F (36.6 C)     TempSrc: Oral     SpO2: 91%  (!) 76% 100%  Weight:  (!) 136.2  kg    Height:        Intake/Output Summary (Last 24 hours) at 02/26/2020 1120 Last data filed at 02/26/2020 1100 Gross per 24 hour  Intake 700 ml  Output 2300 ml  Net -1600 ml   Filed Weights   02/23/20 0521 02/25/20 0500 02/26/20 0500  Weight: (!) 142.1 kg (!) 137.9 kg (!) 136.2 kg    Examination:  General exam: Chronically ill-appearing. Respiratory system: CTAB.  No wheezes, no crackles, no rhonchi.  Normal respiratory effort.  Speaking in full sentences.  Fair air movement.  Cardiovascular system: Irregularly irregular.  No JVD.  1+ bilateral lower extremity edema R >L. Gastrointestinal system: Abdomen is obese, soft, nontender, nondistended, positive bowel sounds.  No rebound.  No guarding. Central nervous system: Alert and oriented.  Right hemiparesis. Extremities: Symmetric 5 x 5 power. Skin: No rashes, lesions or ulcers Psychiatry: Judgement and insight appear normal. Mood & affect appropriate.     Data Reviewed: I have personally reviewed following labs and imaging studies  CBC: Recent Labs  Lab 02/20/20 1002 02/20/20 1002 02/20/20 1018  02/21/20 0253 02/22/20 0245 02/23/20 0512 02/24/20 0510  WBC 12.3*  --   --  10.7* 9.4 11.1* 10.1  NEUTROABS 9.8*  --   --   --   --  8.2*  --   HGB 14.6   < > 15.3 13.8 13.8 14.4 13.9  HCT 46.0   < > 45.0 42.5 43.9 43.2 43.3  MCV 97.5  --   --  95.7 97.6 93.9 95.4  PLT 239  --   --  164 151 195 199   < > = values in this interval not displayed.    Basic Metabolic Panel: Recent Labs  Lab 02/21/20 0253 02/21/20 0253 02/22/20 0245 02/23/20 0512 02/24/20 0510 02/25/20 0546 02/26/20 0605  NA 135   < > 138 138 138 140 138  K 4.5   < > 3.8 3.9 4.1 4.4 3.9  CL 93*   < > 94* 99 100 103 97*  CO2 34*   < > 33* 32  GLUCOSE 98   < > 88 94 95 95 94  BUN 22   < > CREATININE 0.81   < > 0.60* 0.51* 0.52* 0.47* 0.58*  CALCIUM 8.6*   < > 8.4* 8.7* 8.8* 8.7* 8.8*  MG 2.1  --   --  2.1  --   --   --   PHOS 3.7  --   --   --   --   --   --    < > = values in this interval not displayed.    GFR: Estimated Creatinine Clearance: 140.4 mL/min (A) (by C-G formula based on SCr of 0.58 mg/dL (L)).  Liver Function Tests: Recent Labs  Lab 02/20/20 1002  AST 136*  ALT 124*  ALKPHOS 98  BILITOT 0.4  PROT 6.7  ALBUMIN 3.4*    CBG: No results for input(s): GLUCAP in the last 168 hours.   Recent Results (from the past 240 hour(s))  Blood culture (routine single)     Status: None   Collection Time: 02/20/20 10:02 AM   Specimen: BLOOD  Result Value Ref Range Status   Specimen Description   Final    BLOOD LEFT ANTECUBITAL Performed at [facility], [address] [place], [place] [address]    Special Requests   Final    BOTTLES DRAWN AEROBIC  AND ANAEROBIC Blood Culture adequate volume Performed at [facility], [address] [place], [place] [address]    Culture   Final    NO GROWTH 5 DAYS Performed at [facility], [address] [place], [place] [address]    Report Status 02/25/2020 FINAL  Final  Urine  culture     Status: Abnormal   Collection Time: 02/20/20 10:02 AM   Specimen: In/Out Cath Urine  Result Value Ref Range Status   Specimen Description   Final    IN/OUT CATH URINE Performed at [facility], [address] [place], [place] [address]    Special Requests   Final    NONE Performed at [facility], [address] [place], [place] [address]    Culture 60,000 COLONIES/mL KLEBSIELLA PNEUMONIAE (A)  Final   Report Status 02/22/2020 FINAL  Final   Organism ID, Bacteria KLEBSIELLA PNEUMONIAE (A)  Final      Susceptibility   Klebsiella pneumoniae - MIC*    AMPICILLIN >=32 RESISTANT Resistant     CEFAZOLIN <=4 SENSITIVE Sensitive     CEFTRIAXONE <=0.25 SENSITIVE Sensitive     CIPROFLOXACIN <=0.25 SENSITIVE Sensitive     GENTAMICIN <=1 SENSITIVE Sensitive     IMIPENEM <=0.25 SENSITIVE Sensitive     NITROFURANTOIN 64 INTERMEDIATE Intermediate     TRIMETH/SULFA <=20 SENSITIVE Sensitive     AMPICILLIN/SULBACTAM 8 SENSITIVE Sensitive     PIP/TAZO <=4 SENSITIVE Sensitive     * 60,000 COLONIES/mL KLEBSIELLA PNEUMONIAE  SARS Coronavirus 2 by RT PCR (hospital order, performed in [facility] hospital lab) Nasopharyngeal Nasopharyngeal Swab     Status: None   Collection Time: 02/20/20 10:02 AM   Specimen: Nasopharyngeal Swab  Result Value Ref Range Status   SARS Coronavirus 2 NEGATIVE NEGATIVE Final    Comment: (NOTE) SARS-CoV-2 target nucleic acids are NOT DETECTED.  The SARS-CoV-2 RNA is generally detectable in upper and lower respiratory specimens during the acute phase of infection. The lowest concentration of SARS-CoV-2 viral copies this assay can detect is 250 copies / mL. A negative result does not preclude SARS-CoV-2 infection and should not be used as the sole basis for treatment or other patient management decisions.  A negative result may occur with improper specimen collection / handling, submission of specimen other than  nasopharyngeal swab, presence of viral mutation(s) within the areas targeted by this assay, and inadequate number of viral copies (<250 copies / mL). A negative result must be combined with clinical observations, patient history, and epidemiological information.  Fact Sheet for Patients:   BoilerBrush.com.cy  Fact Sheet for Healthcare Providers: https://pope.com/  This test is not yet approved or  cleared by the [place] FDA and has been authorized for detection and/or diagnosis of SARS-CoV-2 by FDA under an Emergency Use Authorization (EUA).  This EUA will remain in effect (meaning this test can be used) for the duration of the COVID-19 declaration under Section 564(b)(1) of the Act, 21 U.S.C. section 360bbb-3(b)(1), unless the authorization is terminated or revoked sooner.  Performed at [facility], [address], [place], [place] [address]   MRSA PCR Screening     Status: None   Collection Time: 02/20/20  8:43 PM   Specimen: Nasal Mucosa; Nasopharyngeal  Result Value Ref Range Status   MRSA by PCR NEGATIVE NEGATIVE Final    Comment:        The GeneXpert MRSA Assay (FDA approved for NASAL specimens only),  is one component of a comprehensive MRSA colonization surveillance program. It is not intended to diagnose MRSA infection nor to guide or monitor treatment for MRSA infections. Performed at [facility], [address], [place], [place] [address]          Radiology Studies: No results found.      Scheduled Meds: . cycloSPORINE  1 drop Both Eyes BID  . enoxaparin (LOVENOX) injection  140 mg Subcutaneous Q12H  . ezetimibe  10 mg Oral Daily  . furosemide  40 mg Oral BID  . gabapentin  300 mg Oral TID  . metoprolol tartrate  12.5 mg Oral BID  . omega-3 acid ethyl esters  1 g Oral BID  . pantoprazole  40 mg Oral Daily  . phenytoin  200 mg Oral BID  . polyvinyl  alcohol  1 drop Both Eyes QID  . rosuvastatin  40 mg Oral QHS  . spironolactone  25 mg Oral Daily  . venlafaxine  75 mg Oral QHS  . warfarin  5 mg Oral ONCE-1600  . Warfarin - Pharmacist Dosing Inpatient   Does not apply q1600   Continuous Infusions: . sodium chloride Stopped (02/23/20 1502)     LOS: 6 days    Time spent: 35 minutes    Ramiro Harvest, MD [facility]   To contact the attending provider between 7A-7P or the covering provider during after hours 7P-7A, please log into the web site [email or web address] and access using universal [place] password for that web site. If you do not have the password, please call the hospital operator.  02/26/2020, 11:20 AM

## 2020-02-26 NOTE — Progress Notes (Signed)
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 Fenofibrate Other (See Comments)    Per [facility]    Patient Measurements: Height: 6\' 1"  (185.4 cm) Weight: (!) 136.2 kg (300 lb 4.8 oz) IBW/kg (Calculated) : 79.9  Vital Signs: Temp: 97.9 F (36.6 C) (09/19 0438) Temp Source: Oral (09/19 0438) BP: 113/89 (09/19 0438) Pulse Rate: 92 (09/19 0438)  Labs: Recent Labs    02/24/20 0510 02/25/20 0546 02/26/20 0605  HGB 13.9  --   --   HCT 43.3  --   --   PLT 199  --   --   LABPROT 14.8 16.9* 18.9*  INR 1.2 1.4* 1.7*  CREATININE 0.52* 0.47* 0.58*    Estimated Creatinine Clearance: 140.4 mL/min (A) (by C-G formula based on SCr of 0.58 mg/dL (L)).   Medications:  Scheduled:  . cycloSPORINE  1 drop Both Eyes BID  . enoxaparin (LOVENOX) injection  140 mg Subcutaneous Q12H  . ezetimibe  10 mg Oral Daily  . furosemide  40 mg Oral BID  . gabapentin  300 mg Oral TID  . metoprolol tartrate  12.5 mg Oral BID  . omega-3 acid ethyl esters  1 g Oral BID  . pantoprazole  40 mg Oral Daily  . phenytoin  200 mg Oral BID  . polyvinyl alcohol  1 drop Both Eyes QID  . rosuvastatin  40 mg Oral QHS  . spironolactone  25 mg Oral Daily  . venlafaxine  75 mg Oral QHS  . Warfarin - Pharmacist Dosing Inpatient   Does not apply q1600   Infusions:  . sodium chloride Stopped (02/23/20 1502)    Assessment: 68 yoM admitted on 9/13 with AMS, N/V, respiratory failure.  PMH includes hypercoagulable state on chronic warfarin anticoagulation. Admission INR was supratherapeutic and warfarin was held. Pharmacy was consulted to resume warfarin when INR normalizes.  PTA warfarin 3.5 mg daily. LD on 9/12 at 1800. -Admission INR 4.3  Today, 02/26/2020:  INR remains low after holding warfarin, but rising appropriately  CBC: stable WNL (9/17)  Diet:  Regular; eating 75-100% of usual intake  No major new drug interactions noted. Recent broad spectrum abx; also note pt on  phenytoin chronically   Goal of Therapy:  INR 2-3 Monitor platelets by anticoagulation protocol: Yes   Plan:   Warfarin 5 mg PO once today  [facility] 1 mg/kg subcutaneous q12h until INR >2  Daily INR  CBC q72 hr  03-03-2002, PharmD, BCPS [phone] 02/26/2020, 11:08 AM

## 2020-02-27 LAB — BASIC METABOLIC PANEL
Anion gap: 7 (ref 5–15)
BUN: 11 mg/dL (ref 8–23)
CO2: 34 mmol/L — ABNORMAL HIGH (ref 22–32)
Calcium: 8.7 mg/dL — ABNORMAL LOW (ref 8.9–10.3)
Chloride: 98 mmol/L (ref 98–111)
Creatinine, Ser: 0.59 mg/dL — ABNORMAL LOW (ref 0.61–1.24)
GFR calc Af Amer: >60 mL/min (ref >60)
GFR calc non Af Amer: >60 mL/min (ref >60)
Glucose, Bld: 97 mg/dL (ref 70–99)
Potassium: 4.1 mmol/L (ref 3.5–5.1)
Sodium: 139 mmol/L (ref 135–145)

## 2020-02-27 LAB — CBC
HCT: 46.1 % (ref 39.0–52.0)
Hemoglobin: 14.8 g/dL (ref 13.0–17.0)
MCH: 31 pg (ref 26.0–34.0)
MCHC: 32.1 g/dL (ref 30.0–36.0)
MCV: 96.6 fL (ref 80.0–100.0)
Platelets: 226 10*3/uL (ref 150–400)
RBC: 4.77 MIL/uL (ref 4.22–5.81)
RDW: 13.9 % (ref 11.5–15.5)
WBC: 9.7 10*3/uL (ref 4.0–10.5)
nRBC: 0 % (ref 0.0–0.2)

## 2020-02-27 LAB — PROTIME-INR
INR: 1.7 — ABNORMAL HIGH (ref 0.8–1.2)
Prothrombin Time: 19.5 seconds — ABNORMAL HIGH (ref 11.4–15.2)

## 2020-02-27 MED ORDER — WARFARIN SODIUM 5 MG PO TABS
5.0000 mg | ORAL_TABLET | Freq: Once | ORAL | Status: AC
  Administered 2020-02-27: 5 mg via ORAL
  Filled 2020-02-27: qty 1

## 2020-02-27 NOTE — Progress Notes (Signed)
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 Fenofibrate Other (See Comments)    Per [facility]    Patient Measurements: Height: 6\' 1"  (185.4 cm) Weight: 135.9 kg (299 lb 9.6 oz) IBW/kg (Calculated) : 79.9  Vital Signs: Temp: 98.2 F (36.8 C) (09/20 0619) Temp Source: Oral (09/20 0619) BP: 113/74 (09/20 0619) Pulse Rate: 93 (09/20 0619)  Labs: Recent Labs    02/25/20 0546 02/26/20 0605 02/27/20 0508  HGB  --   --  14.8  HCT  --   --  46.1  PLT  --   --  226  LABPROT 16.9* 18.9* 19.5*  INR 1.4* 1.7* 1.7*  CREATININE 0.47* 0.58* 0.59*    Estimated Creatinine Clearance: 140.3 mL/min (A) (by C-G formula based on SCr of 0.59 mg/dL (L)).   Medications:  Scheduled:  . cycloSPORINE  1 drop Both Eyes BID  . enoxaparin (LOVENOX) injection  140 mg Subcutaneous Q12H  . ezetimibe  10 mg Oral Daily  . furosemide  40 mg Oral BID  . gabapentin  300 mg Oral TID  . metoprolol tartrate  12.5 mg Oral BID  . omega-3 acid ethyl esters  1 g Oral BID  . pantoprazole  40 mg Oral Daily  . phenytoin  200 mg Oral BID  . polyvinyl alcohol  1 drop Both Eyes QID  . rosuvastatin  40 mg Oral QHS  . spironolactone  25 mg Oral Daily  . traZODone  50 mg Oral QHS  . venlafaxine  75 mg Oral QHS  . Warfarin - Pharmacist Dosing Inpatient   Does not apply q1600   Infusions:  . sodium chloride Stopped (02/23/20 1502)    Assessment: 62 yoM admitted on 9/13 with AMS, N/V, respiratory failure.  PMH includes hypercoagulable state on chronic warfarin anticoagulation. Admission INR was supratherapeutic and warfarin was held. Pharmacy was consulted to resume warfarin when INR normalizes.  PTA warfarin 3.5 mg daily. LD on 9/12 at 1800. -Admission INR 4.3  Today, 02/27/2020:  INR remains low after holding warfarin, but rising slowly although  unchanged from yesterday to today  CBC stable  Diet: Regular; eating 75-100% of usual intake  No major new drug interactions noted. Recent broad spectrum abx; also note pt on  phenytoin chronically   Goal of Therapy:  INR 2-3 Monitor platelets by anticoagulation protocol: Yes   Plan:   Repeat warfarin 5 mg PO once today  [facility] 1 mg/kg subcutaneous q12h until INR >2  Daily INR  CBC q72 hr   02/29/2020, PharmD, BCPS [phone] until 3pm 02/27/2020 8:11 AM

## 2020-02-27 NOTE — Progress Notes (Signed)
PROGRESS NOTE    James ForthDavid B Villegas  ZOX:[id number] DOB: 02/13/1956 DOA: 02/20/2020 PCP: Georgann HousekeeperHusain, Karrar, MD    Chief Complaint  Patient presents with  . 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  N/V x3 days, O2 sats 49% on EMS evaluation.  Work up consistent with AKI, hyperkalemia, and acute hypercarbic respiratory failure  CT head did not show acute pathology. CTA chest did not indicate pulmonary emboli. Small dependent bilateral pleural effusions. Patchy ground glass areas, scattered. EKG shows atrial fibrillation or flutter, inverted T waves in leads V1-V5 similar to prior EKG 07/2019.    Assessment & Plan:   Principal Problem:   Acute respiratory failure (HCC) Active Problems:   Essential hypertension, benign   CAP (community acquired pneumonia)   Obesity, Class III, BMI 40-49.9 (morbid obesity) (HCC)   Bradycardia   Hyperkalemia   Hypercapnic respiratory failure (HCC)   Pressure injury of skin   AKI (acute kidney injury) (HCC)   HCAP (healthcare-associated pneumonia)   Acute metabolic encephalopathy   Hypercoagulable state (HCC)   Seizure disorder (HCC)   Chronic diastolic CHF (congestive heart failure) (HCC)   Atrial fibrillation, chronic (HCC)   History of CVA (cerebrovascular accident)   Hyperlipidemia   1  Acute hypoxemic and hypercapnic respiratory failure, likely secondary to underlying obstructive sleep apnea, component of obesity hypoventilation syndrome, concern for pneumonia, POA Patient admitted to critical care service on admission, due to severe hypoxemia and hypercapnia and hypercapnia.  CTA negative for PE, concern for possible pneumonia on chest x-ray.  Patient noted to have supratherapeutic INR and as such less likely  secondary to an acute PE.  Patient with clinical improvement on BiPAP.  Patient received a dose of Lasix 40 mg IV x1 on 02/21/2020 with a urine output of 1.8 L.  Patient received another dose of Lasix 40 mg IV x1 (02/22/2020 ) with a urine output of 2 L.  Home regimen Lasix spironolactone resumed with a urine output of 502 cc over the past 24 hours.  Urine Legionella antigen negative.  Urine pneumococcus antigen negative.  Patient was on IV vancomycin, IV cefepime.  IV vancomycin discontinued.  Patient improved clinically and as such IV cefepime was narrowed to IV Rocephin for total of 5 days worth of treatment.  Status post 5 days IV antibiotics.  IV Rocephin discontinued 02/24/2020. Continue duo nebs.  BiPAP nightly and as needed.  PCCM/pulmonary arranging outpatient sleep study for BiPAP at facility with outpatient follow-up.  Tolerating current diet. Follow.   2.  Acute metabolic encephalopathy Likely secondary to problem #1.  CT head negative.  UDS negative.  TSH within normal limits.  Vitamin B12 within normal limits.  Phenytoin level normal.  Clinical improvement.  Likely at baseline.  Continue BiPAP as noted above.  Limit sedation medications.  Gabapentin, Effexor and trazodone have been resumed.  Follow.  3.  Acute kidney injury Secondary to a prerenal azotemia in the setting of poor oral intake, emesis.  Urinalysis done with small leukocytes, nitrite negative, 21-50 WBCs.  Renal function improved creatinine down to 0.60.  Patient received a dose of Lasix 40 mg IV x1(02/21/2020) with a urine output of 1.8 L.  40 mg IV Lasix x1 given (02/22/2020 ) with a urine output of 2  L.  Renal function improved.  Home regimen Lasix and spironolactone resumed with a urine output of 502 cc over the past 24 hours.  Renal function stable.  Strict I's and O's.  Daily weights.   4.  Seizure disorder No seizures noted.  Continue phenytoin.   5.  Hypercoagulable state INR noted to be supratherapeutic on admission however  currently subtherapeutic.  INR 1.7.  Continue Lovenox bridge.  Coumadin per pharmacy.    6.  Chronic diastolic heart failure/chronic A. fib/A-flutter/history of PFO No noted closure of PFO per cards last note.  Continue home regimen Lopressor.  Patient status post Lasix 40 mg IV x1 (02/22/2020 ) with a urine output of 2 L.  Home regimen oral Lasix and spinal lactone resumed 02/23/2020 with a urine output of 2.8 L over the past 24 hours.  INR subtherapeutic at 1.7 from 1.4 from 1.2 from 1.3 from 2.1.  Continue Lovenox bridge until INR is therapeutic due to history of CVA, A. fib, history of PFO.  Coumadin per pharmacy.  7.  Hyperlipidemia Continue statin, Lovaza.    8.  History of CVA with residual right-sided weakness/hemiparesis Continue statin, risk factor modification, PT/OT.  Patient with subtherapeutic INR slowly trending up now at 1.7. Patient with history of CVA as well as PFO and A. fib.  Continue Lovenox bridge.  Coumadin per pharmacy.  9.  Morbid obesity  DVT prophylaxis: Full dose Lovenox bridge/Coumadin per pharmacy Code Status: DNR Family Communication: Updated patient.  No family at bedside. Disposition:   Status is: Inpatient    Dispo: The patient is from: SNF              Anticipated d/c is to: SNF              Anticipated d/c date is: When bed available, hopefully 1 to 2 days once INR is therapeutic.              Patient currently on a Lovenox bridge with Coumadin.  Has completed antibiotics.       Consultants:   PCCM admission  Significant Diagnostic Tests:  ECHO 08/24/19 >> EF 60-65%. Normal LV and RV function. No significant valve abnormalities. RVSP 29.48mmHg. CT Head 9/13 >> no acute abnormalities CTA Chest 9/13 >> No PE. Respiratory Motion artifact. Small bilateral pleural effusions and dependent atelectasis.  Abdominal ultrasound 02/20/2020  Micro Data:  COVID 9/13 >> negative  BCx2 9/13 >> Urine 9/13 >>  Significant Hospital Events   9/13 Admit with  N/V, AMS in setting of hypercarbic resp fx, Bipap started in ED   9/14 Mental status resolved, appears to be at baseline       Antimicrobials:  Cefepime 9/13 >> 02/22/2020 Vancomycin 9/13 >> x 1 dose Rocephin 02/22/2020>>>> 02/24/2020   Subjective: Patient laying in bed.  Denies chest pain or shortness of breath.  Stated was able to tolerate BiPAP for 5 hours last night.   Objective: Vitals:   02/26/20 1200 02/26/20 2104 02/27/20 0619 02/27/20 0843  BP: 115/70 106/72 113/74 115/80  Pulse: 75 (!) 109 93 90  Resp: 20 18 20    Temp: 98.5 F (36.9 C) 97.6 F (36.4 C) 98.2 F (36.8 C)   TempSrc: Oral Oral Oral   SpO2: 93% 90% 93%   Weight:   135.9 kg   Height:        Intake/Output Summary (Last 24 hours) at 02/27/2020 1118 Last data filed at 02/27/2020 1100 Gross per 24 hour  Intake 720 ml  Output 902 ml  Net -182 ml   Filed Weights   02/25/20 0500 02/26/20 0500 02/27/20 0619  Weight: (!) 137.9 kg (!) 136.2 kg 135.9 kg    Examination:  General exam: Chronically ill-appearing. Respiratory system: Lungs clear to auscultation bilaterally.  No wheezes, no crackles, no rhonchi.  Normal respiratory effort.  Fair air movement.  Cardiovascular system: Irregularly irregular.  No JVD.  Trace to 1+ bilateral lower extremity edema  R>L.  Gastrointestinal system: Abdomen is soft, obese, nontender, nondistended, positive bowel sounds.  No rebound.  No guarding.  Central nervous system: Alert and oriented.  Right hemiparesis. Extremities: Symmetric 5 x 5 power. Skin: No rashes, lesions or ulcers Psychiatry: Judgement and insight appear normal. Mood & affect appropriate.     Data Reviewed: I have personally reviewed following labs and imaging studies  CBC: Recent Labs  Lab 02/21/20 0253 02/22/20 0245 02/23/20 0512 02/24/20 0510 02/27/20 0508  WBC 10.7* 9.4 11.1* 10.1 9.7  NEUTROABS  --   --  8.2*  --   --   HGB 13.8 13.8 14.4 13.9 14.8  HCT 42.5 43.9 43.2 43.3 46.1  MCV  95.7 97.6 93.9 95.4 96.6  PLT 164 151 195 199 226    Basic Metabolic Panel: Recent Labs  Lab 02/21/20 0253 02/22/20 0245 02/23/20 0512 02/24/20 0510 02/25/20 0546 02/26/20 0605 02/27/20 0508  NA 135   < > 138 138 140 138 139  K 4.5   < > 3.9 4.1 4.4 3.9 4.1  CL 93*   < > 99 100 103 97* 98  CO2 34*   < > 32 34*  GLUCOSE 98   < > 94 95 95 94 97  BUN 22   < > CREATININE 0.81   < > 0.51* 0.52* 0.47* 0.58* 0.59*  CALCIUM 8.6*   < > 8.7* 8.8* 8.7* 8.8* 8.7*  MG 2.1  --  2.1  --   --   --   --   PHOS 3.7  --   --   --   --   --   --    < > = values in this interval not displayed.    GFR: Estimated Creatinine Clearance: 140.3 mL/min (A) (by C-G formula based on SCr of 0.59 mg/dL (L)).  Liver Function Tests: No results for input(s): AST, ALT, ALKPHOS, BILITOT, PROT, ALBUMIN in the last 168 hours.  CBG: No results for input(s): GLUCAP in the last 168 hours.   Recent Results (from the past 240 hour(s))  Blood culture (routine single)     Status: None   Collection Time: 02/20/20 10:02 AM   Specimen: BLOOD  Result Value Ref Range Status   Specimen Description   Final    BLOOD LEFT ANTECUBITAL Performed at [facility], [address] [place], [place] [address]    Special Requests   Final    BOTTLES DRAWN AEROBIC AND ANAEROBIC Blood Culture adequate volume Performed at [facility], [address] [place], [place] [address]    Culture   Final    NO GROWTH 5 DAYS Performed at [facility], [address] [place], [place] [address]    Report Status 02/25/2020 FINAL  Final  Urine culture     Status: Abnormal   Collection Time: 02/20/20 10:02 AM   Specimen: In/Out Cath Urine  Result Value Ref Range Status   Specimen Description   Final  IN/OUT CATH URINE Performed at [facility], [address], [place], [place] [address]    Special Requests   Final    NONE Performed at [facility], [address], [place], [place] [address]    Culture 60,000 COLONIES/mL KLEBSIELLA PNEUMONIAE (A)  Final   Report Status 02/22/2020 FINAL  Final   Organism ID, Bacteria KLEBSIELLA PNEUMONIAE (A)  Final      Susceptibility   Klebsiella pneumoniae - MIC*    AMPICILLIN >=32 RESISTANT Resistant     CEFAZOLIN <=4 SENSITIVE Sensitive     CEFTRIAXONE <=0.25 SENSITIVE Sensitive     CIPROFLOXACIN <=0.25 SENSITIVE Sensitive     GENTAMICIN <=1 SENSITIVE Sensitive     IMIPENEM <=0.25 SENSITIVE Sensitive     NITROFURANTOIN 64 INTERMEDIATE Intermediate     TRIMETH/SULFA <=20 SENSITIVE Sensitive     AMPICILLIN/SULBACTAM 8 SENSITIVE Sensitive     PIP/TAZO <=4 SENSITIVE Sensitive     * 60,000 COLONIES/mL KLEBSIELLA PNEUMONIAE  SARS Coronavirus 2 by RT PCR (hospital order, performed in [facility] hospital lab) Nasopharyngeal Nasopharyngeal Swab     Status: None   Collection Time: 02/20/20 10:02 AM   Specimen: Nasopharyngeal Swab  Result Value Ref Range Status   SARS Coronavirus 2 NEGATIVE NEGATIVE Final    Comment: (NOTE) SARS-CoV-2 target nucleic acids are NOT DETECTED.  The SARS-CoV-2 RNA is generally detectable in upper and lower respiratory specimens during the acute phase of infection. The lowest concentration of SARS-CoV-2 viral copies this assay can detect is 250 copies / mL. A negative result does not preclude SARS-CoV-2 infection and should not be used as the sole basis for treatment or other patient management decisions.  A negative result may occur with improper specimen collection / handling, submission of specimen other than nasopharyngeal swab, presence of viral mutation(s) within the areas targeted by this assay, and inadequate number of viral copies (<250 copies / mL). A negative result must be combined with clinical observations, patient history, and epidemiological information.  Fact Sheet for Patients:    BoilerBrush.com.cy  Fact Sheet for Healthcare Providers: https://pope.com/  This test is not yet approved or  cleared by the [place] FDA and has been authorized for detection and/or diagnosis of SARS-CoV-2 by FDA under an Emergency Use Authorization (EUA).  This EUA will remain in effect (meaning this test can be used) for the duration of the COVID-19 declaration under Section 564(b)(1) of the Act, 21 U.S.C. section 360bbb-3(b)(1), unless the authorization is terminated or revoked sooner.  Performed at [facility], [address], [place], [place] [address]   MRSA PCR Screening     Status: None   Collection Time: 02/20/20  8:43 PM   Specimen: Nasal Mucosa; Nasopharyngeal  Result Value Ref Range Status   MRSA by PCR NEGATIVE NEGATIVE Final    Comment:        The GeneXpert MRSA Assay (FDA approved for NASAL specimens only), is one component of a comprehensive MRSA colonization surveillance program. It is not intended to diagnose MRSA infection nor to guide or monitor treatment for MRSA infections. Performed at [facility], [address], [place], [place] [address]          Radiology Studies: No results found.      Scheduled Meds: . cycloSPORINE  1 drop Both Eyes BID  . enoxaparin (LOVENOX) injection  140 mg Subcutaneous Q12H  . ezetimibe  10 mg Oral Daily  . furosemide  40 mg  Oral BID  . gabapentin  300 mg Oral TID  . metoprolol tartrate  12.5 mg Oral BID  . omega-3 acid ethyl esters  1 g Oral BID  . pantoprazole  40 mg Oral Daily  . phenytoin  200 mg Oral BID  . polyvinyl alcohol  1 drop Both Eyes QID  . rosuvastatin  40 mg Oral QHS  . spironolactone  25 mg Oral Daily  . traZODone  50 mg Oral QHS  . venlafaxine  75 mg Oral QHS  . warfarin  5 mg Oral ONCE-1600  . Warfarin - Pharmacist Dosing Inpatient   Does not apply q1600   Continuous Infusions: . sodium  chloride Stopped (02/23/20 1502)     LOS: 7 days    Time spent: 35 minutes    Ramiro Harvest, MD [facility]   To contact the attending provider between 7A-7P or the covering provider during after hours 7P-7A, please log into the web site [email or web address] and access using universal [place] password for that web site. If you do not have the password, please call the hospital operator.  02/27/2020, 11:18 AM

## 2020-02-27 NOTE — TOC Progression Note (Signed)
Transition of Care [facility]) - Progression Note    Patient Details  Name: James Villegas MRN: [id number] Date of Birth: [birth year]  Transition of Care [facility]) CM/SW Contact  Staley Lunz, Olegario Messier, RN Phone Number: 02/27/2020, 11:22 AM  Clinical Narrative: Spoke to patient's sister Susan-they prefer going to a LTC facility-[facility] rep [facility] has LTC beds available;[facility] rep [facility] has LTC beds available;& [facility](where patient came from) they also can accept back to LTC if patient/sister prefers. COVID vaccine Moderna 09/23/19,10/21/19.      Expected Discharge Plan: Long Term Nursing Home Barriers to Discharge: Continued Medical Work up  Expected Discharge Plan and Services Expected Discharge Plan: Long Term Nursing Home   Discharge Planning Services: CM Consult Post Acute Care Choice: Skilled Nursing Facility Living arrangements for the past 2 months: Skilled Nursing Facility                                       Social Determinants of Health (SDOH) Interventions    Readmission Risk Interventions No flowsheet data found.

## 2020-02-27 NOTE — TOC Progression Note (Addendum)
Transition of Care [facility]) - Progression Note    Patient Details  Name: James Villegas MRN: [id number] Date of Birth: 07/18/1955  Transition of Care [facility]) CM/SW Contact  Regnald Bowens, Olegario Messier, RN Phone Number: 02/27/2020, 1:00 PM  Clinical Narrative: Per attending covid ordered. Will d/c to [facility] LTC rep [facility] aware in am if stable-await d/c summary.DNR already signed.    4p-Spoke to [facility] rep [facility]-she will get covid vaccine card from [facility])-Awaiting d/c summary,covid results.  Expected Discharge Plan: Long Term Nursing Home Barriers to Discharge: Continued Medical Work up  Expected Discharge Plan and Services Expected Discharge Plan: Long Term Nursing Home   Discharge Planning Services: CM Consult Post Acute Care Choice: Skilled Nursing Facility Living arrangements for the past 2 months: Skilled Nursing Facility                                       Social Determinants of Health (SDOH) Interventions    Readmission Risk Interventions No flowsheet data found.

## 2020-02-28 DIAGNOSIS — M14662 Charcot's joint, left knee: Secondary | ICD-10-CM | POA: Diagnosis not present

## 2020-02-28 DIAGNOSIS — I10 Essential (primary) hypertension: Secondary | ICD-10-CM | POA: Diagnosis not present

## 2020-02-28 DIAGNOSIS — D6859 Other primary thrombophilia: Secondary | ICD-10-CM | POA: Diagnosis not present

## 2020-02-28 DIAGNOSIS — J189 Pneumonia, unspecified organism: Secondary | ICD-10-CM | POA: Diagnosis not present

## 2020-02-28 DIAGNOSIS — I5033 Acute on chronic diastolic (congestive) heart failure: Secondary | ICD-10-CM | POA: Diagnosis not present

## 2020-02-28 DIAGNOSIS — F3489 Other specified persistent mood disorders: Secondary | ICD-10-CM | POA: Diagnosis not present

## 2020-02-28 DIAGNOSIS — E875 Hyperkalemia: Secondary | ICD-10-CM | POA: Diagnosis not present

## 2020-02-28 DIAGNOSIS — M25562 Pain in left knee: Secondary | ICD-10-CM | POA: Diagnosis not present

## 2020-02-28 DIAGNOSIS — I482 Chronic atrial fibrillation, unspecified: Secondary | ICD-10-CM | POA: Diagnosis not present

## 2020-02-28 DIAGNOSIS — E662 Morbid (severe) obesity with alveolar hypoventilation: Secondary | ICD-10-CM | POA: Diagnosis not present

## 2020-02-28 DIAGNOSIS — I5032 Chronic diastolic (congestive) heart failure: Secondary | ICD-10-CM | POA: Diagnosis not present

## 2020-02-28 DIAGNOSIS — N179 Acute kidney failure, unspecified: Secondary | ICD-10-CM | POA: Diagnosis not present

## 2020-02-28 DIAGNOSIS — Z20822 Contact with and (suspected) exposure to covid-19: Secondary | ICD-10-CM | POA: Diagnosis not present

## 2020-02-28 DIAGNOSIS — E785 Hyperlipidemia, unspecified: Secondary | ICD-10-CM | POA: Diagnosis not present

## 2020-02-28 DIAGNOSIS — G9349 Other encephalopathy: Secondary | ICD-10-CM | POA: Diagnosis not present

## 2020-02-28 DIAGNOSIS — R0902 Hypoxemia: Secondary | ICD-10-CM | POA: Diagnosis not present

## 2020-02-28 DIAGNOSIS — R5381 Other malaise: Secondary | ICD-10-CM | POA: Diagnosis not present

## 2020-02-28 DIAGNOSIS — J188 Other pneumonia, unspecified organism: Secondary | ICD-10-CM | POA: Diagnosis not present

## 2020-02-28 DIAGNOSIS — I69359 Hemiplegia and hemiparesis following cerebral infarction affecting unspecified side: Secondary | ICD-10-CM | POA: Diagnosis not present

## 2020-02-28 DIAGNOSIS — R569 Unspecified convulsions: Secondary | ICD-10-CM | POA: Diagnosis not present

## 2020-02-28 DIAGNOSIS — J9612 Chronic respiratory failure with hypercapnia: Secondary | ICD-10-CM | POA: Diagnosis not present

## 2020-02-28 DIAGNOSIS — J9601 Acute respiratory failure with hypoxia: Secondary | ICD-10-CM | POA: Diagnosis not present

## 2020-02-28 DIAGNOSIS — R1110 Vomiting, unspecified: Secondary | ICD-10-CM | POA: Diagnosis not present

## 2020-02-28 DIAGNOSIS — M2550 Pain in unspecified joint: Secondary | ICD-10-CM | POA: Diagnosis not present

## 2020-02-28 DIAGNOSIS — I110 Hypertensive heart disease with heart failure: Secondary | ICD-10-CM | POA: Diagnosis not present

## 2020-02-28 DIAGNOSIS — Z8673 Personal history of transient ischemic attack (TIA), and cerebral infarction without residual deficits: Secondary | ICD-10-CM | POA: Diagnosis not present

## 2020-02-28 DIAGNOSIS — G4730 Sleep apnea, unspecified: Secondary | ICD-10-CM | POA: Diagnosis not present

## 2020-02-28 DIAGNOSIS — G9341 Metabolic encephalopathy: Secondary | ICD-10-CM | POA: Diagnosis not present

## 2020-02-28 DIAGNOSIS — R4689 Other symptoms and signs involving appearance and behavior: Secondary | ICD-10-CM | POA: Diagnosis not present

## 2020-02-28 DIAGNOSIS — Z7401 Bed confinement status: Secondary | ICD-10-CM | POA: Diagnosis not present

## 2020-02-28 DIAGNOSIS — M25569 Pain in unspecified knee: Secondary | ICD-10-CM | POA: Diagnosis not present

## 2020-02-28 DIAGNOSIS — G40909 Epilepsy, unspecified, not intractable, without status epilepticus: Secondary | ICD-10-CM | POA: Diagnosis not present

## 2020-02-28 DIAGNOSIS — Z23 Encounter for immunization: Secondary | ICD-10-CM | POA: Diagnosis not present

## 2020-02-28 DIAGNOSIS — E7849 Other hyperlipidemia: Secondary | ICD-10-CM | POA: Diagnosis not present

## 2020-02-28 LAB — SARS CORONAVIRUS 2 (TAT 6-24 HRS): SARS Coronavirus 2: NEGATIVE

## 2020-02-28 LAB — BASIC METABOLIC PANEL
Anion gap: 10 (ref 5–15)
BUN: 13 mg/dL (ref 8–23)
CO2: 33 mmol/L — ABNORMAL HIGH (ref 22–32)
Calcium: 9.1 mg/dL (ref 8.9–10.3)
Chloride: 99 mmol/L (ref 98–111)
Creatinine, Ser: 0.52 mg/dL — ABNORMAL LOW (ref 0.61–1.24)
GFR calc Af Amer: >60 mL/min (ref >60)
GFR calc non Af Amer: >60 mL/min (ref >60)
Glucose, Bld: 100 mg/dL — ABNORMAL HIGH (ref 70–99)
Potassium: 4 mmol/L (ref 3.5–5.1)
Sodium: 142 mmol/L (ref 135–145)

## 2020-02-28 LAB — PROTIME-INR
INR: 1.8 — ABNORMAL HIGH (ref 0.8–1.2)
Prothrombin Time: 19.9 seconds — ABNORMAL HIGH (ref 11.4–15.2)

## 2020-02-28 MED ORDER — ENOXAPARIN SODIUM 150 MG/ML ~~LOC~~ SOLN: 140.0000 mg | Freq: Two times a day (BID) | SUBCUTANEOUS | 0 refills | Status: DC

## 2020-02-28 MED ORDER — OMEPRAZOLE 40 MG PO CPDR: 40.0000 mg | DELAYED_RELEASE_CAPSULE | Freq: Every day | ORAL | 1 refills | Status: DC

## 2020-02-28 MED ORDER — WARFARIN SODIUM 6 MG PO TABS
6.0000 mg | ORAL_TABLET | Freq: Once | ORAL | Status: DC
  Filled 2020-02-28: qty 1

## 2020-02-28 MED ORDER — WARFARIN SODIUM 5 MG PO TABS
5.0000 mg | ORAL_TABLET | Freq: Every day | ORAL | 0 refills | Status: DC
Start: 2020-02-28 — End: 2020-07-30

## 2020-02-28 NOTE — Progress Notes (Signed)
Report given to Misty Stanley, Charity fundraiser at [facility].

## 2020-02-28 NOTE — Progress Notes (Signed)
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), atrial fibrillation  Allergies  Allergen Reactions   Fenofibrate Other (See Comments)    Per [facility]    Patient Measurements: Height: 6\' 1"  (185.4 cm) Weight: 135.9 kg (299 lb 9.6 oz) IBW/kg (Calculated) : 79.9  Vital Signs: Temp: 97.6 F (36.4 C) (09/21 0448) Temp Source: Oral (09/21 0448) BP: 118/72 (09/21 0448) Pulse Rate: 100 (09/21 0448)  Labs: Recent Labs    02/26/20 0605 02/27/20 0508 02/28/20 0435  HGB  --  14.8  --   HCT  --  46.1  --   PLT  --  226  --   LABPROT 18.9* 19.5* 19.9*  INR 1.7* 1.7* 1.8*  CREATININE 0.58* 0.59* 0.52*    Estimated Creatinine Clearance: 140.3 mL/min (A) (by C-G formula based on SCr of 0.52 mg/dL (L)).   Medications:  Scheduled:   cycloSPORINE  1 drop Both Eyes BID   enoxaparin (LOVENOX) injection  140 mg Subcutaneous Q12H   ezetimibe  10 mg Oral Daily   furosemide  40 mg Oral BID   gabapentin  300 mg Oral TID   metoprolol tartrate  12.5 mg Oral BID   omega-3 acid ethyl esters  1 g Oral BID   pantoprazole  40 mg Oral Daily   phenytoin  200 mg Oral BID   polyvinyl alcohol  1 drop Both Eyes QID   rosuvastatin  40 mg Oral QHS   spironolactone  25 mg Oral Daily   traZODone  50 mg Oral QHS   venlafaxine  75 mg Oral QHS   Warfarin - Pharmacist Dosing Inpatient   Does not apply q1600   Infusions:   sodium chloride Stopped (02/23/20 1502)    Assessment: 63 yoM admitted on 9/13 with AMS, N/V, respiratory failure.  PMH includes hypercoagulable state on chronic warfarin anticoagulation. Admission INR was supratherapeutic and warfarin was held. Pharmacy was consulted to resume warfarin when INR normalizes.  PTA warfarin 3.5 mg daily. LD on 9/12 at 1800. -Admission INR 4.3  Today, 02/28/2020:  INR remains SUBtherapeutic but steadily rising  CBC stable  Diet:  Regular; eating 75-100% of usual intake  No major new drug interactions noted. Recent broad spectrum abx; also note pt on phenytoin chronically   Goal of Therapy:  INR 2-3 Monitor platelets by anticoagulation protocol: Yes   Plan:   Increase warfarin to 6mg  PO once today  [facility] 1 mg/kg subcutaneous q12h until INR >2  Daily INR  CBC q72 hr   03/01/2020, PharmD, BCPS [phone] until 3pm 02/28/2020 8:00 AM

## 2020-02-28 NOTE — Progress Notes (Signed)
Attempted to call report to [phone]. No answer. Will attempt again as time permits.

## 2020-02-28 NOTE — Discharge Summary (Signed)
Physician Discharge Summary  James Villegas:[id number] DOB: Oct 06, 1955 DOA: 02/20/2020  PCP: [name], MD  Admit date: 02/20/2020 Discharge date: 02/28/2020  Time spent: 60 minutes  Recommendations for Outpatient Follow-up:  1. Patient was discharged to skilled nursing facility.  Follow-up with MD at skilled nursing facility.  Patient will need a basic metabolic profile done in 1 week to follow-up on electrolytes and renal function.  Patient also need a CBC done to follow-up on H&H. 2. Patient will need PT/INR done daily until INR is therapeutic.  Patient being discharged on a Lovenox bridge with Coumadin.  Once INR is > 2, Lovenox bridge may be discontinued and further dosing of Coumadin per MD at skilled nursing facility.  Patient is being discharged on Coumadin 5 mg daily.  Patient noted to have been on Coumadin 3.5 mg daily prior to admission. 3. Patient scheduled for split-night sleep study 03/20/2020  4. Follow-up with Dr. Craige Cotta, pulmonary 04/24/2020 at 10:30 AM.   Discharge Diagnoses:  Principal Problem:   Acute respiratory failure (HCC) Active Problems:   Essential hypertension, benign   CAP (community acquired pneumonia)   Obesity, Class III, BMI 40-49.9 (morbid obesity) (HCC)   Bradycardia   Hyperkalemia   Hypercapnic respiratory failure (HCC)   Pressure injury of skin   AKI (acute kidney injury) (HCC)   HCAP (healthcare-associated pneumonia)   Acute metabolic encephalopathy   Hypercoagulable state (HCC)   Seizure disorder (HCC)   Chronic diastolic CHF (congestive heart failure) (HCC)   Atrial fibrillation, chronic (HCC)   History of CVA (cerebrovascular accident)   Hyperlipidemia   Discharge Condition: Stable and improved  Diet recommendation: Heart healthy  Filed Weights   02/25/20 0500 02/26/20 0500 02/27/20 0619  Weight: (!) 137.9 kg (!) 136.2 kg 135.9 kg    History of present illness:  HPI per Dr James Villegas is a 64 year old male with  history of CVA with residual right sided hemiparesis, PFO, 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  Per EMS report he had nausea and vomiting for 3 days prior and was found unresponsive the morning of admission at his nursing home. He was noted to have an oxygen saturation around 49% and was placed on a simple facemask with improvement in his oxygen saturations. He was responsive to verbal stimuli.   Lab work up showed potassium of 6.1, Cr 1.25 (Baseline 0.6). BNP 670. Troponin 34. Lactic acid 2.7. WBC count 12.3k. Urinalysis with moderate hemoglobin.  CT head did not show acute pathology. CTA chest did not indicate pulmonary emboli. Small dependent bilateral pleural effusions. Patchy ground glass areas, scattered. EKG shows atrial fibrillation or flutter, inverted T waves in leads V1-V5 similar to prior EKG 07/2019.  Patient was initially admitted to the hospitalist service but due to the persistent somnolence an ABG was obtained showing pH 7.2, pCO2 80, pO2 124. PCCM was then consulted.   Hospital Course:  1  Acute hypoxemic and hypercapnic respiratory failure, likely secondary to underlying obstructive sleep apnea, component of obesity hypoventilation syndrome, concern for pneumonia, POA Patient admitted to critical care service on admission, due to severe hypoxemia and hypercapnia and hypercapnia.  CTA negative for PE, concern for possible pneumonia on chest x-ray.  Patient noted to have supratherapeutic INR and as such less likely secondary to an acute PE.  Patient with clinical improvement on BiPAP.  Patient received a dose of Lasix 40 mg  IV x1 on 02/21/2020 with a urine output of 1.8 L.  Patient received another dose of Lasix 40 mg IV x1 (02/22/2020 ) with a urine output of 2 L.  Home regimen Lasix spironolactone resumed with good urine output..  Urine Legionella antigen negative.  Urine pneumococcus  antigen negative.  Patient was on IV vancomycin, IV cefepime.  IV vancomycin discontinued.  Patient improved clinically and as such IV cefepime was narrowed to IV Rocephin for total of 5 days worth of treatment.  Status post 5 days IV antibiotics.  IV Rocephin discontinued 02/24/2020.  Patient maintained on duo nebs, BiPAP nightly and as needed.  PCCM/pulmonary arranged outpatient sleep study for BiPAP at facility with outpatient follow-up.  Patient was back to baseline by day of discharge.  Patient noted to have sats of 94% on room air.  Outpatient follow-up.  2.  Acute metabolic encephalopathy Likely secondary to problem #1.  CT head negative.  UDS negative.  TSH within normal limits.  Vitamin B12 within normal limits.  Phenytoin level normal.  Clinical improvement.  Likely at baseline by day of discharge.  Patient maintained on BiPAP as noted above.  Sedation medications were limited.  As patient improved clinically patient's gabapentin, Effexor and trazodone were subsequently resumed.  Patient tolerated them.  I was at baseline by day of discharge.  Outpatient follow-up.  3.  Acute kidney injury Secondary to a prerenal azotemia in the setting of poor oral intake, emesis.  Urinalysis done with small leukocytes, nitrite negative, 21-50 WBCs.  Renal function improved creatinine down to 0.60.  Patient received a dose of Lasix 40 mg IV x1(02/21/2020) with a urine output of 1.8 L.  40 mg IV Lasix x1 given (02/22/2020 ) with a urine output of 2 L.  Renal function improved.  Home regimen Lasix and spironolactone resumed with good urine output.  Acute kidney injury had resolved by day of discharge.  Outpatient follow-up.   4.  Seizure disorder No seizures noted.  Patient maintained on home regimen of phenytoin.   5.  Hypercoagulable state INR noted to be supratherapeutic on admission.  Patient was placed on a Lovenox bridge.  Coumadin managed per pharmacy.  INR slowly trended back up and was 1.8 by day of  discharge.  Patient be discharged on a Lovenox bridge as well as Coumadin until INR is therapeutic and then Coumadin may be discontinued.  Outpatient follow-up.   6.  Chronic diastolic heart failure/chronic A. fib/A-flutter/history of PFO No noted closure of PFO per cards last note.    Patient was maintained on home regimen Lopressor.  Patient status post Lasix 40 mg IV x1 (02/22/2020 ) with a urine output of 2 L.  Home regimen oral Lasix and spironolactone resumed 02/23/2020 with a urine output of 2.8 L over the past 24 hours.  INR subtherapeutic at 1.8 from 1.7 from 1.4 from 1.2 from 1.3 from 2.1.  Patient maintained on a Lovenox bridge as well as Coumadin until INR is therapeutic due to history of CVA, A. fib, history of PFO.    Patient will be discharged on Lovenox bridge as well as Coumadin until INR is therapeutic and then Lovenox may be discontinued.  Outpatient follow-up.    7.  Hyperlipidemia Maintained on a statin, Lovaza.    8.  History of CVA with residual right-sided weakness/hemiparesis Patient was maintained on statin, risk factor modification, PT/OT.  Patient with subtherapeutic INR slowly trending up now at 1.8 by day of discharge.. Patient with history  of CVA as well as PFO and A. fib.  Patient was maintained on a Lovenox bridge as well as Coumadin per pharmacy.  Patient be discharged on a Lovenox bridge as well as Coumadin 5 mg daily until INR is therapeutic and Lovenox subsequently will be discontinued.  Outpatient follow-up.  9.  Morbid obesity  Procedures:  Significant Diagnostic Tests:  ECHO 08/24/19>>EF 60-65%. Normal LV and RV function. No significant valve abnormalities. RVSP 29.50mmHg. CT Head 9/13>>no acute abnormalities CTA Chest 9/13 >>No PE. Respiratory Motion artifact. Small bilateral pleural effusions and dependent atelectasis.  Abdominal ultrasound 02/20/2020  Micro Data:  COVID 9/13 >> negative BCx29/13 >> Urine 9/13 >>  Significant Hospital  Events  9/13 Admit with N/V, AMS in setting of hypercarbic resp fx,Bipap started in ED 9/14 Mental status resolved, appears to be at baseline   Consultations:  PCCM admission  Discharge Exam: Vitals:   02/27/20 2134 02/28/20 0448  BP: 126/80 118/72  Pulse: 68 100  Resp: 19 19  Temp: 98.3 F (36.8 C) 97.6 F (36.4 C)  SpO2: 94% 90%    General: NAD Cardiovascular: RRR Respiratory: CTAB anterior lung fields  Discharge Instructions   Discharge Instructions    Diet - low sodium heart healthy   Complete by: As directed    Discharge wound care:   Complete by: As directed    As above   Increase activity slowly   Complete by: As directed      Allergies as of 02/28/2020      Reactions   Fenofibrate Other (See Comments)   Per MAR      Medication List    STOP taking these medications   potassium chloride 10 MEQ tablet Commonly known as: KLOR-CON   torsemide 20 MG tablet Commonly known as: DEMADEX     TAKE these medications   acetaminophen 325 MG tablet Commonly known as: TYLENOL Take 650 mg by mouth every 6 (six) hours as needed for mild pain or headache.   enoxaparin 150 MG/ML injection Commonly known as: LOVENOX Inject 0.94 mLs (140 mg total) into the skin every 12 (twelve) hours for 5 days.   eucerin cream Apply 1 application topically daily as needed for dry skin.   ezetimibe 10 MG tablet Commonly known as: ZETIA Take 10 mg by mouth daily.   fish oil-omega-3 fatty acids 1000 MG capsule Take 1 g by mouth 2 (two) times daily.   furosemide 40 MG tablet Commonly known as: LASIX Take 40 mg by mouth 2 (two) times daily.   gabapentin 300 MG capsule Commonly known as: NEURONTIN Take 300 mg by mouth 3 (three) times daily.   guaifenesin 100 MG/5ML syrup Commonly known as: ROBITUSSIN Take 200 mg by mouth every 4 (four) hours as needed for cough.   hydrOXYzine 10 MG tablet Commonly known as: ATARAX/VISTARIL Take 10 mg by mouth 3 (three) times  daily as needed for itching.   metoprolol tartrate 25 MG tablet Commonly known as: LOPRESSOR Take 0.5 tablets (12.5 mg total) by mouth 2 (two) times daily.   omeprazole 40 MG capsule Commonly known as: PRILOSEC Take 1 capsule (40 mg total) by mouth daily. What changed:   medication strength  how much to take  when to take this   ondansetron 4 MG tablet Commonly known as: ZOFRAN Take 4 mg by mouth every 8 (eight) hours as needed for nausea or vomiting.   phenytoin 100 MG ER capsule Commonly known as: DILANTIN Take 200 mg by mouth 2 (two)  times daily.   REFRESH OPTIVE ADVANCED OP Place 1 drop into both eyes 4 (four) times daily. Wait 3-5 minutes between eye meds   Restasis 0.05 % ophthalmic emulsion Generic drug: cycloSPORINE Place 1 drop into both eyes 2 (two) times daily.   rosuvastatin 40 MG tablet Commonly known as: CRESTOR Take 40 mg by mouth at bedtime.   spironolactone 25 MG tablet Commonly known as: ALDACTONE Take 1 tablet (25 mg total) by mouth daily.   traZODone 50 MG tablet Commonly known as: DESYREL Take 50 mg by mouth at bedtime.   triamcinolone cream 0.1 % Commonly known as: KENALOG Apply 1 application topically daily as needed (rash).   venlafaxine 75 MG tablet Commonly known as: EFFEXOR Take 75 mg by mouth daily.   warfarin 5 MG tablet Commonly known as: COUMADIN Take as directed. If you are unsure how to take this medication, talk to your nurse or doctor. Original instructions: Take 1 tablet (5 mg total) by mouth daily. What changed:   medication strength  how much to take            Discharge Care Instructions  (From admission, onward)         Start     Ordered   02/28/20 0000  Discharge wound care:       Comments: As above   02/28/20 1139         Allergies  Allergen Reactions  . Fenofibrate Other (See Comments)    Per MAR    Contact information for follow-up providers    MD AT SNF Follow up.        SPLIT SLEEP  NIGHT STUDY Follow up on 03/20/2020.        Coralyn Helling, MD Follow up on 04/24/2020.   Specialty: Pulmonary Disease Contact information: [address] [place] [address] [phone]            Contact information for after-discharge care    Destination    [facility] .   Service: Skilled Nursing Contact information: [address] [place] [address] [phone]                   The results of significant diagnostics from this hospitalization (including imaging, microbiology, ancillary and laboratory) are listed below for reference.    Significant Diagnostic Studies: CT Head Wo Contrast  Result Date: 02/20/2020 CLINICAL DATA:  Mental status changes. Supratherapeutic on Coumadin. EXAM: CT HEAD WITHOUT CONTRAST TECHNIQUE: Contiguous axial images were obtained from the base of the skull through the vertex without intravenous contrast. COMPARISON:  None. FINDINGS: Brain: Remote left MCA territory infarct is stable. Basal ganglia are intact bilaterally. No acute infarct, hemorrhage, or mass lesion is present. Volume loss is noted. The brainstem and cerebellum are within normal limits. Vascular: No hyperdense vessel or unexpected calcification. Skull: Calvarium is intact. No focal lytic or blastic lesions are present. No significant extracranial soft tissue lesion is present. Sinuses/Orbits: The paranasal sinuses and mastoid air cells are clear. The globes and orbits are within normal limits. IMPRESSION: 1. No acute intracranial abnormality or significant interval change. 2. Stable remote left MCA territory infarct. Electronically Signed   By: Marin Roberts M.D.   On: 02/20/2020 11:13   CT ANGIO CHEST PE W OR WO CONTRAST  Result Date: 02/20/2020 CLINICAL DATA:  Nausea and vomiting for 3 days. Found unresponsive this morning with hypoxemia. High clinical suspicion for pulmonary embolism. EXAM: CT ANGIOGRAPHY CHEST WITH CONTRAST  TECHNIQUE: Multidetector CT imaging of the chest was performed using the standard protocol during bolus administration of intravenous contrast. Multiplanar CT image reconstructions and MIPs were obtained to evaluate the vascular anatomy. CONTRAST:  OMNIPAQUE IOHEXOL 350 MG/ML SOLN COMPARISON:  Chest CTA 09/13/2019.  Radiograph 02/20/2020. FINDINGS: Cardiovascular: The pulmonary arteries are well opacified with contrast to the level of the segmental branches. More peripheral pulmonary arterial evaluation is limited by breathing artifact. No central or segmental pulmonary emboli are identified. There is limited opacification of the aorta which is grossly stable in appearance. The ascending aorta measures up to 4.5 cm, similar to previous study. The heart is mildly enlarged. There is no pericardial effusion. Mediastinum/Nodes: There are no enlarged mediastinal, hilar or axillary lymph nodes. The thyroid gland, trachea and esophagus demonstrate no significant findings. Lungs/Pleura: New small dependent pleural effusions bilaterally with associated dependent atelectasis in both lungs. In addition, there are patchy ground-glass opacities within the aerated portions of both lungs, demonstrating an upper lobe predominance. These opacities may reflect multilobar pneumonia or edema. No pneumothorax. Upper abdomen: No acute findings are seen within the visualized upper abdomen. Possible hepatic steatosis and changes of early cirrhosis. Musculoskeletal/Chest wall: There is no chest wall mass or suspicious osseous finding. Review of the MIP images confirms the above findings. IMPRESSION: 1. No evidence of central or segmental pulmonary emboli. More peripheral pulmonary arterial evaluation is limited by breathing artifact. Consider correlation with lower extremity venous Doppler ultrasound 2. New small dependent pleural effusions bilaterally with associated dependent atelectasis in both lungs. Additional patchy ground-glass  opacities within the aerated portions of both lungs, as seen on earlier radiographs, may reflect multilobar pneumonia or edema. 3. Stable mild dilatation of the ascending aorta. Electronically Signed   By: Carey Bullocks M.D.   On: 02/20/2020 13:49   DG Chest Port 1 View  Result Date: 02/20/2020 CLINICAL DATA:  64 year old male with history of possible sepsis. EXAM: PORTABLE CHEST 1 VIEW COMPARISON:  Chest x-ray 08/02/2018. FINDINGS: Patchy asymmetrically distributed areas of interstitial prominence and ill-defined airspace consolidation in the lungs bilaterally (left greater than right), most confluent in the perihilar aspect of the left lung, concerning for potential multilobar pneumonia. Possible trace right pleural effusion. No left pleural effusion. Pulmonary vasculature is partially obscured. No pneumothorax. Heart size is mildly enlarged. Upper mediastinal contours are within normal limits. IMPRESSION: 1. The appearance the chest is most compatible with multilobar bilateral pneumonia (left greater than right). 2. Probable trace right pleural effusion. 3. Mild cardiomegaly. Electronically Signed   By: Trudie Reed M.D.   On: 02/20/2020 10:50   US Abdomen Limited RUQ  Result Date: 02/20/2020 CLINICAL DATA:  Vomiting EXAM: ULTRASOUND ABDOMEN LIMITED RIGHT UPPER QUADRANT COMPARISON:  CT abdomen pelvis 06/30/2013 FINDINGS: Gallbladder: No gallstones within the gallbladder lumen. Gallbladder wall thickening measuring up to 10 mm is identified. No pericholecystic fluid. A negative sonographic Eulah Pont sign was reported by sonographer; however, please note that it is documented that patient was not awake adn on BIPAP during the exam. Common bile duct: Diameter: 5 mm Liver: Normal in size. No focal lesion identified. Within normal limits in parenchymal echogenicity. Portal vein is patent on color Doppler imaging with normal direction of blood flow towards the liver. Right kidney: Incompletely evaluated but  measuring up to 11.8 cm in length. Pancreas not visualized. Other: None. IMPRESSION: Markedly limited right upper quadrant ultrasound due to patient's mental status leading to difficulty in cooperation per the sonographer notes. Nonspecific marked gallbladder wall thickening  up to 10 mm which can be seen in chronic liver disease versus gallbladder disease. No associated pericholecystic fluid or cholelithiasis identified. Hepatic parenchyma grossly unremarkable. Electronically Signed   By: Tish Frederickson M.D.   On: 02/20/2020 19:28    Microbiology: Recent Results (from the past 240 hour(s))  Blood culture (routine single)     Status: None   Collection Time: 02/20/20 10:02 AM   Specimen: BLOOD  Result Value Ref Range Status   Specimen Description   Final    BLOOD LEFT ANTECUBITAL Performed at [facility], [address] [place], [place] [address]    Special Requests   Final    BOTTLES DRAWN AEROBIC AND ANAEROBIC Blood Culture adequate volume Performed at [facility], [address] [place], [place] [address]    Culture   Final    NO GROWTH 5 DAYS Performed at [facility], [address] [place], [place] [address]    Report Status 02/25/2020 FINAL  Final  Urine culture     Status: Abnormal   Collection Time: 02/20/20 10:02 AM   Specimen: In/Out Cath Urine  Result Value Ref Range Status   Specimen Description   Final    IN/OUT CATH URINE Performed at [facility], [address] [place], [place] [address]    Special Requests   Final    NONE Performed at [facility], [address] [place], [place] [address]    Culture 60,000 COLONIES/mL KLEBSIELLA PNEUMONIAE (A)  Final   Report Status 02/22/2020 FINAL  Final   Organism ID, Bacteria KLEBSIELLA PNEUMONIAE (A)  Final      Susceptibility   Klebsiella pneumoniae - MIC*    AMPICILLIN >=32 RESISTANT Resistant     CEFAZOLIN <=4 SENSITIVE Sensitive      CEFTRIAXONE <=0.25 SENSITIVE Sensitive     CIPROFLOXACIN <=0.25 SENSITIVE Sensitive     GENTAMICIN <=1 SENSITIVE Sensitive     IMIPENEM <=0.25 SENSITIVE Sensitive     NITROFURANTOIN 64 INTERMEDIATE Intermediate     TRIMETH/SULFA <=20 SENSITIVE Sensitive     AMPICILLIN/SULBACTAM 8 SENSITIVE Sensitive     PIP/TAZO <=4 SENSITIVE Sensitive     * 60,000 COLONIES/mL KLEBSIELLA PNEUMONIAE  SARS Coronavirus 2 by RT PCR (hospital order, performed in [facility] hospital lab) Nasopharyngeal Nasopharyngeal Swab     Status: None   Collection Time: 02/20/20 10:02 AM   Specimen: Nasopharyngeal Swab  Result Value Ref Range Status   SARS Coronavirus 2 NEGATIVE NEGATIVE Final    Comment: (NOTE) SARS-CoV-2 target nucleic acids are NOT DETECTED.  The SARS-CoV-2 RNA is generally detectable in upper and lower respiratory specimens during the acute phase of infection. The lowest concentration of SARS-CoV-2 viral copies this assay can detect is 250 copies / mL. A negative result does not preclude SARS-CoV-2 infection and should not be used as the sole basis for treatment or other patient management decisions.  A negative result may occur with improper specimen collection / handling, submission of specimen other than nasopharyngeal swab, presence of viral mutation(s) within the areas targeted by this assay, and inadequate number of viral copies (<250 copies / mL). A negative result must be combined with clinical observations, patient history, and epidemiological information.  Fact Sheet for Patients:   BoilerBrush.com.cy  Fact Sheet for Healthcare Providers: https://pope.com/  This test is not yet approved or  cleared by the [place] FDA and has been authorized for detection and/or diagnosis of SARS-CoV-2 by FDA  under an Emergency Use Authorization (EUA).  This EUA will remain in effect (meaning this test can be used) for the duration of  the COVID-19 declaration under Section 564(b)(1) of the Act, 21 U.S.C. section 360bbb-3(b)(1), unless the authorization is terminated or revoked sooner.  Performed at [facility], [address], [place], [place] [address]   MRSA PCR Screening     Status: None   Collection Time: 02/20/20  8:43 PM   Specimen: Nasal Mucosa; Nasopharyngeal  Result Value Ref Range Status   MRSA by PCR NEGATIVE NEGATIVE Final    Comment:        The GeneXpert MRSA Assay (FDA approved for NASAL specimens only), is one component of a comprehensive MRSA colonization surveillance program. It is not intended to diagnose MRSA infection nor to guide or monitor treatment for MRSA infections. Performed at [facility], [address] [place], [place] [address]   SARS CORONAVIRUS 2 (TAT 6-24 HRS) Nasopharyngeal Nasopharyngeal Swab     Status: None   Collection Time: 02/27/20  6:19 PM   Specimen: Nasopharyngeal Swab  Result Value Ref Range Status   SARS Coronavirus 2 NEGATIVE NEGATIVE Final    Comment: (NOTE) SARS-CoV-2 target nucleic acids are NOT DETECTED.  The SARS-CoV-2 RNA is generally detectable in upper and lower respiratory specimens during the acute phase of infection. Negative results do not preclude SARS-CoV-2 infection, do not rule out co-infections with other pathogens, and should not be used as the sole basis for treatment or other patient management decisions. Negative results must be combined with clinical observations, patient history, and epidemiological information. The expected result is Negative.  Fact Sheet for Patients: HairSlick.no  Fact Sheet for Healthcare Providers: quierodirigir.com  This test is not yet approved or cleared by the [place] FDA and  has been authorized for detection and/or diagnosis of SARS-CoV-2 by FDA under an Emergency Use Authorization (EUA). This EUA will  remain  in effect (meaning this test can be used) for the duration of the COVID-19 declaration under Se ction 564(b)(1) of the Act, 21 U.S.C. section 360bbb-3(b)(1), unless the authorization is terminated or revoked sooner.  Performed at [facility], [address] [place], [place] [address]      Labs: Basic Metabolic Panel: Recent Labs  Lab 02/23/20 0512 02/23/20 0512 02/24/20 0510 02/25/20 0546 02/26/20 0605 02/27/20 0508 02/28/20 0435  NA 138   < > 138 140 138 139 142  K 3.9   < > 4.1 4.4 3.9 4.1 4.0  CL 99   < > 100 103 97* 98 99  CO2 30   < > 28 28 32 34* 33*  GLUCOSE 94   < > 95 95 94 97 100*  BUN 10   < > 9 14 11 11 13   CREATININE 0.51*   < > 0.52* 0.47* 0.58* 0.59* 0.52*  CALCIUM 8.7*   < > 8.8* 8.7* 8.8* 8.7* 9.1  MG 2.1  --   --   --   --   --   --    < > = values in this interval not displayed.   Liver Function Tests: No results for input(s): AST, ALT, ALKPHOS, BILITOT, PROT, ALBUMIN in the last 168 hours. No results for input(s): LIPASE, AMYLASE in the last 168 hours. No results for input(s): AMMONIA in the last 168 hours. CBC: Recent Labs  Lab 02/22/20 0245 02/23/20 0512 02/24/20 0510 02/27/20 0508  WBC 9.4 11.1* 10.1 9.7  NEUTROABS  --  8.2*  --   --   HGB 13.8 14.4 13.9 14.8  HCT 43.9 43.2 43.3 46.1  MCV 97.6 93.9 95.4 96.6  PLT 151 195 199 226   Cardiac Enzymes: No results for input(s): CKTOTAL, CKMB, CKMBINDEX, TROPONINI in the last 168 hours. BNP: BNP (last 3 results) Recent Labs    02/20/20 1002  BNP 670.0*    ProBNP (last 3 results) Recent Labs    07/15/19 1055  PROBNP 229*    CBG: No results for input(s): GLUCAP in the last 168 hours.     Signed:  Ramiro Harvest MD.  [facility] 02/28/2020, 11:52 AM

## 2020-02-28 NOTE — TOC Progression Note (Addendum)
Transition of Care [facility]) - Progression Note    Patient Details  Name: James Villegas MRN: [id number] Date of Birth: 27-Jul-1955  Transition [facility]) CM/SW Contact  Kirah Stice, Olegario Messier, RN Phone Number: 02/28/2020, 10:58 AM  Clinical Narrative: Has a bed @ [facility]. covid results neg 9/20. 12p-going to rm 409B,tel#nsg report-[id number]. [facility] called for 1:30p pick up-Nsg aware. No further CM needs.      Expected Discharge Plan: Long Term Nursing Home Barriers to Discharge: Continued Medical Work up  Expected Discharge Plan and Services Expected Discharge Plan: Long Term Nursing Home   Discharge Planning Services: CM Consult Post Acute Care Choice: Skilled Nursing Facility Living arrangements for the past 2 months: Skilled Nursing Facility                                       Social Determinants of Health (SDOH) Interventions    Readmission Risk Interventions No flowsheet data found.

## 2020-02-28 NOTE — Progress Notes (Signed)
Sister Ayinde Swim updated with patient's status.

## 2020-02-28 NOTE — Care Management Important Message (Signed)
Important Message  Patient Details IM Letter given to the Patient Name: James Villegas MRN: [id number] Date of Birth: 14-Mar-1956   Medicare Important Message Given:  Yes     Caren Macadam 02/28/2020, 10:24 AM

## 2020-02-28 NOTE — Progress Notes (Signed)
Attempted to call report to [place] x2/3 more times; the phone continues to ring with no answer. Will attempt as time permits.

## 2020-02-29 DIAGNOSIS — G9349 Other encephalopathy: Secondary | ICD-10-CM | POA: Diagnosis not present

## 2020-02-29 DIAGNOSIS — G40909 Epilepsy, unspecified, not intractable, without status epilepticus: Secondary | ICD-10-CM | POA: Diagnosis not present

## 2020-02-29 DIAGNOSIS — F3489 Other specified persistent mood disorders: Secondary | ICD-10-CM | POA: Diagnosis not present

## 2020-02-29 DIAGNOSIS — J9612 Chronic respiratory failure with hypercapnia: Secondary | ICD-10-CM | POA: Diagnosis not present

## 2020-02-29 DIAGNOSIS — D6859 Other primary thrombophilia: Secondary | ICD-10-CM | POA: Diagnosis not present

## 2020-02-29 DIAGNOSIS — I69359 Hemiplegia and hemiparesis following cerebral infarction affecting unspecified side: Secondary | ICD-10-CM | POA: Diagnosis not present

## 2020-02-29 DIAGNOSIS — J9601 Acute respiratory failure with hypoxia: Secondary | ICD-10-CM | POA: Diagnosis not present

## 2020-02-29 DIAGNOSIS — I5033 Acute on chronic diastolic (congestive) heart failure: Secondary | ICD-10-CM | POA: Diagnosis not present

## 2020-02-29 DIAGNOSIS — E662 Morbid (severe) obesity with alveolar hypoventilation: Secondary | ICD-10-CM | POA: Diagnosis not present

## 2020-02-29 DIAGNOSIS — J188 Other pneumonia, unspecified organism: Secondary | ICD-10-CM | POA: Diagnosis not present

## 2020-02-29 DIAGNOSIS — E7849 Other hyperlipidemia: Secondary | ICD-10-CM | POA: Diagnosis not present

## 2020-03-09 DIAGNOSIS — J188 Other pneumonia, unspecified organism: Secondary | ICD-10-CM | POA: Diagnosis not present

## 2020-03-09 DIAGNOSIS — I69359 Hemiplegia and hemiparesis following cerebral infarction affecting unspecified side: Secondary | ICD-10-CM | POA: Diagnosis not present

## 2020-03-09 DIAGNOSIS — M25562 Pain in left knee: Secondary | ICD-10-CM | POA: Diagnosis not present

## 2020-03-11 DIAGNOSIS — M25562 Pain in left knee: Secondary | ICD-10-CM | POA: Diagnosis not present

## 2020-03-12 DIAGNOSIS — M25562 Pain in left knee: Secondary | ICD-10-CM | POA: Diagnosis not present

## 2020-03-19 DIAGNOSIS — R4689 Other symptoms and signs involving appearance and behavior: Secondary | ICD-10-CM | POA: Diagnosis not present

## 2020-03-19 DIAGNOSIS — M25562 Pain in left knee: Secondary | ICD-10-CM | POA: Diagnosis not present

## 2020-03-20 ENCOUNTER — Ambulatory Visit (HOSPITAL_BASED_OUTPATIENT_CLINIC_OR_DEPARTMENT_OTHER): Payer: Medicare Other | Attending: Pulmonary Disease

## 2020-03-26 DIAGNOSIS — G40909 Epilepsy, unspecified, not intractable, without status epilepticus: Secondary | ICD-10-CM | POA: Diagnosis not present

## 2020-03-26 DIAGNOSIS — M25562 Pain in left knee: Secondary | ICD-10-CM | POA: Diagnosis not present

## 2020-03-26 DIAGNOSIS — I5032 Chronic diastolic (congestive) heart failure: Secondary | ICD-10-CM | POA: Diagnosis not present

## 2020-03-26 DIAGNOSIS — I69359 Hemiplegia and hemiparesis following cerebral infarction affecting unspecified side: Secondary | ICD-10-CM | POA: Diagnosis not present

## 2020-04-23 DIAGNOSIS — I5032 Chronic diastolic (congestive) heart failure: Secondary | ICD-10-CM | POA: Diagnosis not present

## 2020-04-23 DIAGNOSIS — J9612 Chronic respiratory failure with hypercapnia: Secondary | ICD-10-CM | POA: Diagnosis not present

## 2020-04-23 DIAGNOSIS — E662 Morbid (severe) obesity with alveolar hypoventilation: Secondary | ICD-10-CM | POA: Diagnosis not present

## 2020-04-24 ENCOUNTER — Encounter: Payer: Self-pay | Admitting: Pulmonary Disease

## 2020-04-24 ENCOUNTER — Ambulatory Visit (INDEPENDENT_AMBULATORY_CARE_PROVIDER_SITE_OTHER): Payer: Medicare Other | Admitting: Pulmonary Disease

## 2020-04-24 ENCOUNTER — Other Ambulatory Visit: Payer: Self-pay

## 2020-04-24 VITALS — BP 122/64 | HR 54 | Temp 98.1°F | Ht 73.0 in

## 2020-04-24 DIAGNOSIS — G4730 Sleep apnea, unspecified: Secondary | ICD-10-CM

## 2020-04-24 NOTE — Progress Notes (Signed)
James Villegas, Critical Care, and Sleep Medicine  Chief Complaint  Patient presents with  . Consult    sleep consult    Constitutional:  BP 122/64 (BP Location: Left Arm, Cuff Size: Normal)   Pulse (!) 54   Temp 98.1 F (36.7 C) (Other (Comment)) Comment (Src): wrist  Ht 6\' 1"  (1.854 m)   SpO2 91% Comment: Room air  BMI 39.53 kg/m   Past Medical History:  CVA with Rt hemiparesis, PFO, Protein C and S deficiency, Lupus anticoagulatn, Rectus sheath hematoma July 2012, s/p IVC July 2012, DVT, Seizures, HTN, HLD, Persistent atrial fibrillation, Diastolic CHF, Anxiety, Depression  Past Surgical History:  His  has a past surgical history that includes vena cavogram (12/2010) and dental extraction.  Brief Summary:  James Villegas is a 64 y.o. male former smoker with sleep disordered breathing.      Subjective:   He was in hospital in September 2021 with altered mental status, nausea, vomiting, and hypoxia in setting of AKI with hyperkalemia.  He was tried on Bipap and supplemental oxygen.  He was discharged to [facility] on 02/28/20.  He hasn't been using Bipap at SNF.  They put oxygen on him intermittently at facility.  He had sleep study scheduled on 03/20/20.  He says he went to facility, was told they didn't have anyone to assist him out of wheelchair, and needed to have test rescheduled.  It doesn't appear that this was rescheduled.  He isn't sure if he snores.  He wakes up frequently during the night.  He does nap during the day, but drinks a bottle of 05/20/20 daily.  He uses trazodone.     Physical Exam:   Appearance - in wheelchair, right sided weakness  ENMT - no sinus tenderness, no oral exudate, no LAN, Mallampati 4 airway, no stridor  Respiratory - equal breath sounds bilaterally, no wheezing or rales  CV - s1s2 regular rate and rhythm, no murmurs  Ext - right arm and right leg edema  Skin - no rashes  Psych - normal mood and affect     Villegas testing:   ABG with Bipap 02/20/20 >> pH 7.35, PCO2 56.5, PO2 79.8  Sleep Tests:    Cardiac Tests:   Echo 08/24/19 >> EF 60 to 65%, aortic root 45 mm  Social History:  He  reports that he quit smoking about 11 years ago. His smoking use included cigarettes and cigars. He has a 15.00 pack-year smoking history. He has never used smokeless tobacco. He reports that he does not drink alcohol and does not use drugs.  Family History:  His family history is not on file.      Assessment/Plan:   Sleep disordered breathing. - likely combination of obstructive sleep apnea and obesity hypoventilation syndrome - he needs to have in lab sleep study to assess this further - it appears sleep study was scheduled for March 20, 2020 but was not able to be completed due to logistical issues with getting him from wheelchair to bed; will see if this can get rescheduled  Permanent Atrial Fibrillation, Chronic diastolic CHF, PFO. - followed by Dr. March 22, 2020 and Dr. Tobias Alexander with [facility]  Goals of care. - DNR  Time Spent Involved in Patient Care on Day of Examination:  33 minutes  Follow up:  Patient Instructions  Will make sure you have in lab sleep study scheduled  Follow up in 3 months   Medication List:  Allergies as of 04/24/2020      Reactions   Fenofibrate Other (See Comments)   Per [facility]      Medication List       Accurate as of April 24, 2020 11:06 AM. If you have any questions, ask your nurse or doctor.        acetaminophen 325 MG tablet Commonly known as: TYLENOL Take 650 mg by mouth every 6 (six) hours as needed for mild pain or headache.   enoxaparin 150 MG/ML injection Commonly known as: LOVENOX Inject 0.94 mLs (140 mg total) into the skin every 12 (twelve) hours for 5 days.   eucerin cream Apply 1 application topically daily as needed for dry skin.   ezetimibe 10 MG tablet Commonly known as: ZETIA Take 10 mg by mouth  daily.   fish oil-omega-3 fatty acids 1000 MG capsule Take 1 g by mouth 2 (two) times daily.   furosemide 40 MG tablet Commonly known as: LASIX Take 40 mg by mouth 2 (two) times daily.   gabapentin 300 MG capsule Commonly known as: NEURONTIN Take 300 mg by mouth 3 (three) times daily.   guaifenesin 100 MG/5ML syrup Commonly known as: ROBITUSSIN Take 200 mg by mouth every 4 (four) hours as needed for cough.   hydrOXYzine 10 MG tablet Commonly known as: ATARAX/VISTARIL Take 10 mg by mouth 3 (three) times daily as needed for itching.   metoprolol tartrate 25 MG tablet Commonly known as: LOPRESSOR Take 0.5 tablets (12.5 mg total) by mouth 2 (two) times daily.   omeprazole 40 MG capsule Commonly known as: PRILOSEC Take 1 capsule (40 mg total) by mouth daily.   ondansetron 4 MG tablet Commonly known as: ZOFRAN Take 4 mg by mouth every 8 (eight) hours as needed for nausea or vomiting.   phenytoin 100 MG ER capsule Commonly known as: DILANTIN Take 200 mg by mouth 2 (two) times daily.   REFRESH OPTIVE ADVANCED OP Place 1 drop into both eyes 4 (four) times daily. Wait 3-5 minutes between eye meds   Restasis 0.05 % ophthalmic emulsion Generic drug: cycloSPORINE Place 1 drop into both eyes 2 (two) times daily.   rosuvastatin 40 MG tablet Commonly known as: CRESTOR Take 40 mg by mouth at bedtime.   spironolactone 25 MG tablet Commonly known as: ALDACTONE Take 1 tablet (25 mg total) by mouth daily.   traZODone 50 MG tablet Commonly known as: DESYREL Take 50 mg by mouth at bedtime.   triamcinolone 0.1 % Commonly known as: KENALOG Apply 1 application topically daily as needed (rash).   venlafaxine 75 MG tablet Commonly known as: EFFEXOR Take 75 mg by mouth daily.   warfarin 5 MG tablet Commonly known as: COUMADIN Take as directed by the anticoagulation clinic. If you are unsure how to take this medication, talk to your nurse or doctor. Original instructions: Take 1  tablet (5 mg total) by mouth daily.       Signature:  Coralyn Helling, MD [facility] Villegas/Critical Care Pager - [phone] 04/24/2020, 11:06 AM

## 2020-04-24 NOTE — Patient Instructions (Signed)
Will make sure you have in lab sleep study scheduled  Follow up in 3 months

## 2020-05-09 DIAGNOSIS — M25569 Pain in unspecified knee: Secondary | ICD-10-CM | POA: Diagnosis not present

## 2020-05-09 DIAGNOSIS — I69359 Hemiplegia and hemiparesis following cerebral infarction affecting unspecified side: Secondary | ICD-10-CM | POA: Diagnosis not present

## 2020-05-09 DIAGNOSIS — M14662 Charcot's joint, left knee: Secondary | ICD-10-CM | POA: Diagnosis not present

## 2020-05-18 ENCOUNTER — Telehealth: Payer: Self-pay | Admitting: Pulmonary Disease

## 2020-05-18 DIAGNOSIS — G4730 Sleep apnea, unspecified: Secondary | ICD-10-CM

## 2020-05-18 NOTE — Telephone Encounter (Signed)
Called and spoke with Deanna at [facility] who states they were checking on sleep study. Advised her that patient had sleep study scheduled on 10/12 and was marked as a no show. Advised patient saw Dr. Craige Cotta on 11/16 and was told we would order in lab sleep study. From what I can see this was not ordered.   Dr. Craige Cotta does this need to be ordered for patient?

## 2020-05-21 ENCOUNTER — Telehealth: Payer: Self-pay | Admitting: Pulmonary Disease

## 2020-05-21 DIAGNOSIS — G4730 Sleep apnea, unspecified: Secondary | ICD-10-CM

## 2020-05-21 NOTE — Telephone Encounter (Signed)
Forwarding to Dr. Craige Cotta as James Villegas. Please advise on how to proceed.

## 2020-05-21 NOTE — Telephone Encounter (Signed)
Will need to arrange for referral to sleep center at [facility] to see if they can accommodate his needs for hoyer lift to transfer from wheelchair to sleep lab bed.

## 2020-05-21 NOTE — Telephone Encounter (Signed)
Order has been placed for the split night study. Attempted to call pt to let him know this had been done but unable to reach.left a detailed message on machine letting pt know that order had been placed.

## 2020-05-21 NOTE — Telephone Encounter (Signed)
ATC patient unable to reach LM to call back office (x1)   Order placed for referral to [facility],

## 2020-05-21 NOTE — Telephone Encounter (Signed)
My understanding was that his previous order for sleep study was still effective and he would be rescheduled.  Apparently, this was not correct.  He needs to get sleep study scheduled.  Please place order to schedule this.  Needs split night sleep study.

## 2020-05-21 NOTE — Telephone Encounter (Signed)
Per James Villegas, the sleep lab is not able to schedule the patient for the split night sleep study because he will need a hoyar lift.

## 2020-05-22 NOTE — Telephone Encounter (Signed)
Lmtcb for pt.  

## 2020-05-28 NOTE — Telephone Encounter (Signed)
Will close encounter as we have made 2 attempts to reach pt without a return call.

## 2020-06-08 DIAGNOSIS — S6000XA Contusion of unspecified finger without damage to nail, initial encounter: Secondary | ICD-10-CM | POA: Diagnosis not present

## 2020-06-08 DIAGNOSIS — I5032 Chronic diastolic (congestive) heart failure: Secondary | ICD-10-CM | POA: Diagnosis not present

## 2020-06-08 DIAGNOSIS — R21 Rash and other nonspecific skin eruption: Secondary | ICD-10-CM | POA: Diagnosis not present

## 2020-06-08 DIAGNOSIS — I69359 Hemiplegia and hemiparesis following cerebral infarction affecting unspecified side: Secondary | ICD-10-CM | POA: Diagnosis not present

## 2020-06-08 DIAGNOSIS — M19041 Primary osteoarthritis, right hand: Secondary | ICD-10-CM | POA: Diagnosis not present

## 2020-06-08 DIAGNOSIS — I480 Paroxysmal atrial fibrillation: Secondary | ICD-10-CM | POA: Diagnosis not present

## 2020-06-08 DIAGNOSIS — M79644 Pain in right finger(s): Secondary | ICD-10-CM | POA: Diagnosis not present

## 2020-06-08 DIAGNOSIS — G4089 Other seizures: Secondary | ICD-10-CM | POA: Diagnosis not present

## 2020-06-08 DIAGNOSIS — F39 Unspecified mood [affective] disorder: Secondary | ICD-10-CM | POA: Diagnosis not present

## 2020-06-14 DIAGNOSIS — M24551 Contracture, right hip: Secondary | ICD-10-CM | POA: Diagnosis not present

## 2020-06-14 DIAGNOSIS — I6930 Unspecified sequelae of cerebral infarction: Secondary | ICD-10-CM | POA: Diagnosis not present

## 2020-06-14 DIAGNOSIS — Z993 Dependence on wheelchair: Secondary | ICD-10-CM | POA: Diagnosis not present

## 2020-06-14 DIAGNOSIS — M24541 Contracture, right hand: Secondary | ICD-10-CM | POA: Diagnosis not present

## 2020-06-14 DIAGNOSIS — R1312 Dysphagia, oropharyngeal phase: Secondary | ICD-10-CM | POA: Diagnosis not present

## 2020-06-14 DIAGNOSIS — R293 Abnormal posture: Secondary | ICD-10-CM | POA: Diagnosis not present

## 2020-06-15 DIAGNOSIS — I6930 Unspecified sequelae of cerebral infarction: Secondary | ICD-10-CM | POA: Diagnosis not present

## 2020-06-15 DIAGNOSIS — R1312 Dysphagia, oropharyngeal phase: Secondary | ICD-10-CM | POA: Diagnosis not present

## 2020-06-15 DIAGNOSIS — M24551 Contracture, right hip: Secondary | ICD-10-CM | POA: Diagnosis not present

## 2020-06-15 DIAGNOSIS — M24541 Contracture, right hand: Secondary | ICD-10-CM | POA: Diagnosis not present

## 2020-06-15 DIAGNOSIS — Z993 Dependence on wheelchair: Secondary | ICD-10-CM | POA: Diagnosis not present

## 2020-06-15 DIAGNOSIS — R293 Abnormal posture: Secondary | ICD-10-CM | POA: Diagnosis not present

## 2020-06-18 DIAGNOSIS — M24541 Contracture, right hand: Secondary | ICD-10-CM | POA: Diagnosis not present

## 2020-06-18 DIAGNOSIS — R293 Abnormal posture: Secondary | ICD-10-CM | POA: Diagnosis not present

## 2020-06-18 DIAGNOSIS — I6930 Unspecified sequelae of cerebral infarction: Secondary | ICD-10-CM | POA: Diagnosis not present

## 2020-06-18 DIAGNOSIS — Z993 Dependence on wheelchair: Secondary | ICD-10-CM | POA: Diagnosis not present

## 2020-06-18 DIAGNOSIS — R1312 Dysphagia, oropharyngeal phase: Secondary | ICD-10-CM | POA: Diagnosis not present

## 2020-06-18 DIAGNOSIS — M24551 Contracture, right hip: Secondary | ICD-10-CM | POA: Diagnosis not present

## 2020-06-19 DIAGNOSIS — M24541 Contracture, right hand: Secondary | ICD-10-CM | POA: Diagnosis not present

## 2020-06-19 DIAGNOSIS — Z993 Dependence on wheelchair: Secondary | ICD-10-CM | POA: Diagnosis not present

## 2020-06-19 DIAGNOSIS — R1312 Dysphagia, oropharyngeal phase: Secondary | ICD-10-CM | POA: Diagnosis not present

## 2020-06-19 DIAGNOSIS — R293 Abnormal posture: Secondary | ICD-10-CM | POA: Diagnosis not present

## 2020-06-19 DIAGNOSIS — M24551 Contracture, right hip: Secondary | ICD-10-CM | POA: Diagnosis not present

## 2020-06-19 DIAGNOSIS — I6930 Unspecified sequelae of cerebral infarction: Secondary | ICD-10-CM | POA: Diagnosis not present

## 2020-06-20 DIAGNOSIS — I6930 Unspecified sequelae of cerebral infarction: Secondary | ICD-10-CM | POA: Diagnosis not present

## 2020-06-20 DIAGNOSIS — Z993 Dependence on wheelchair: Secondary | ICD-10-CM | POA: Diagnosis not present

## 2020-06-20 DIAGNOSIS — M24541 Contracture, right hand: Secondary | ICD-10-CM | POA: Diagnosis not present

## 2020-06-20 DIAGNOSIS — R1312 Dysphagia, oropharyngeal phase: Secondary | ICD-10-CM | POA: Diagnosis not present

## 2020-06-20 DIAGNOSIS — M24551 Contracture, right hip: Secondary | ICD-10-CM | POA: Diagnosis not present

## 2020-06-20 DIAGNOSIS — R293 Abnormal posture: Secondary | ICD-10-CM | POA: Diagnosis not present

## 2020-06-21 DIAGNOSIS — M24541 Contracture, right hand: Secondary | ICD-10-CM | POA: Diagnosis not present

## 2020-06-21 DIAGNOSIS — R293 Abnormal posture: Secondary | ICD-10-CM | POA: Diagnosis not present

## 2020-06-21 DIAGNOSIS — Z993 Dependence on wheelchair: Secondary | ICD-10-CM | POA: Diagnosis not present

## 2020-06-21 DIAGNOSIS — M24551 Contracture, right hip: Secondary | ICD-10-CM | POA: Diagnosis not present

## 2020-06-21 DIAGNOSIS — I6930 Unspecified sequelae of cerebral infarction: Secondary | ICD-10-CM | POA: Diagnosis not present

## 2020-06-21 DIAGNOSIS — R1312 Dysphagia, oropharyngeal phase: Secondary | ICD-10-CM | POA: Diagnosis not present

## 2020-06-22 DIAGNOSIS — R293 Abnormal posture: Secondary | ICD-10-CM | POA: Diagnosis not present

## 2020-06-22 DIAGNOSIS — R1312 Dysphagia, oropharyngeal phase: Secondary | ICD-10-CM | POA: Diagnosis not present

## 2020-06-22 DIAGNOSIS — I6930 Unspecified sequelae of cerebral infarction: Secondary | ICD-10-CM | POA: Diagnosis not present

## 2020-06-22 DIAGNOSIS — Z993 Dependence on wheelchair: Secondary | ICD-10-CM | POA: Diagnosis not present

## 2020-06-22 DIAGNOSIS — M24551 Contracture, right hip: Secondary | ICD-10-CM | POA: Diagnosis not present

## 2020-06-22 DIAGNOSIS — M24541 Contracture, right hand: Secondary | ICD-10-CM | POA: Diagnosis not present

## 2020-06-25 DIAGNOSIS — I6930 Unspecified sequelae of cerebral infarction: Secondary | ICD-10-CM | POA: Diagnosis not present

## 2020-06-25 DIAGNOSIS — M24551 Contracture, right hip: Secondary | ICD-10-CM | POA: Diagnosis not present

## 2020-06-25 DIAGNOSIS — R293 Abnormal posture: Secondary | ICD-10-CM | POA: Diagnosis not present

## 2020-06-25 DIAGNOSIS — R1312 Dysphagia, oropharyngeal phase: Secondary | ICD-10-CM | POA: Diagnosis not present

## 2020-06-25 DIAGNOSIS — Z993 Dependence on wheelchair: Secondary | ICD-10-CM | POA: Diagnosis not present

## 2020-06-25 DIAGNOSIS — M24541 Contracture, right hand: Secondary | ICD-10-CM | POA: Diagnosis not present

## 2020-06-26 DIAGNOSIS — R293 Abnormal posture: Secondary | ICD-10-CM | POA: Diagnosis not present

## 2020-06-26 DIAGNOSIS — R1312 Dysphagia, oropharyngeal phase: Secondary | ICD-10-CM | POA: Diagnosis not present

## 2020-06-26 DIAGNOSIS — M24551 Contracture, right hip: Secondary | ICD-10-CM | POA: Diagnosis not present

## 2020-06-26 DIAGNOSIS — M24541 Contracture, right hand: Secondary | ICD-10-CM | POA: Diagnosis not present

## 2020-06-26 DIAGNOSIS — I6930 Unspecified sequelae of cerebral infarction: Secondary | ICD-10-CM | POA: Diagnosis not present

## 2020-06-26 DIAGNOSIS — Z993 Dependence on wheelchair: Secondary | ICD-10-CM | POA: Diagnosis not present

## 2020-06-27 DIAGNOSIS — R1312 Dysphagia, oropharyngeal phase: Secondary | ICD-10-CM | POA: Diagnosis not present

## 2020-06-27 DIAGNOSIS — Z993 Dependence on wheelchair: Secondary | ICD-10-CM | POA: Diagnosis not present

## 2020-06-27 DIAGNOSIS — M24551 Contracture, right hip: Secondary | ICD-10-CM | POA: Diagnosis not present

## 2020-06-27 DIAGNOSIS — R293 Abnormal posture: Secondary | ICD-10-CM | POA: Diagnosis not present

## 2020-06-27 DIAGNOSIS — M24541 Contracture, right hand: Secondary | ICD-10-CM | POA: Diagnosis not present

## 2020-06-27 DIAGNOSIS — I6930 Unspecified sequelae of cerebral infarction: Secondary | ICD-10-CM | POA: Diagnosis not present

## 2020-06-28 DIAGNOSIS — M24541 Contracture, right hand: Secondary | ICD-10-CM | POA: Diagnosis not present

## 2020-06-28 DIAGNOSIS — R293 Abnormal posture: Secondary | ICD-10-CM | POA: Diagnosis not present

## 2020-06-28 DIAGNOSIS — I6930 Unspecified sequelae of cerebral infarction: Secondary | ICD-10-CM | POA: Diagnosis not present

## 2020-06-28 DIAGNOSIS — M24551 Contracture, right hip: Secondary | ICD-10-CM | POA: Diagnosis not present

## 2020-06-28 DIAGNOSIS — Z993 Dependence on wheelchair: Secondary | ICD-10-CM | POA: Diagnosis not present

## 2020-06-28 DIAGNOSIS — R1312 Dysphagia, oropharyngeal phase: Secondary | ICD-10-CM | POA: Diagnosis not present

## 2020-06-29 DIAGNOSIS — R1312 Dysphagia, oropharyngeal phase: Secondary | ICD-10-CM | POA: Diagnosis not present

## 2020-06-29 DIAGNOSIS — I6930 Unspecified sequelae of cerebral infarction: Secondary | ICD-10-CM | POA: Diagnosis not present

## 2020-06-29 DIAGNOSIS — M24541 Contracture, right hand: Secondary | ICD-10-CM | POA: Diagnosis not present

## 2020-06-29 DIAGNOSIS — M24551 Contracture, right hip: Secondary | ICD-10-CM | POA: Diagnosis not present

## 2020-06-29 DIAGNOSIS — Z993 Dependence on wheelchair: Secondary | ICD-10-CM | POA: Diagnosis not present

## 2020-06-29 DIAGNOSIS — R293 Abnormal posture: Secondary | ICD-10-CM | POA: Diagnosis not present

## 2020-07-02 DIAGNOSIS — M24551 Contracture, right hip: Secondary | ICD-10-CM | POA: Diagnosis not present

## 2020-07-02 DIAGNOSIS — R1312 Dysphagia, oropharyngeal phase: Secondary | ICD-10-CM | POA: Diagnosis not present

## 2020-07-02 DIAGNOSIS — I6930 Unspecified sequelae of cerebral infarction: Secondary | ICD-10-CM | POA: Diagnosis not present

## 2020-07-02 DIAGNOSIS — R293 Abnormal posture: Secondary | ICD-10-CM | POA: Diagnosis not present

## 2020-07-02 DIAGNOSIS — M24541 Contracture, right hand: Secondary | ICD-10-CM | POA: Diagnosis not present

## 2020-07-02 DIAGNOSIS — Z993 Dependence on wheelchair: Secondary | ICD-10-CM | POA: Diagnosis not present

## 2020-07-03 DIAGNOSIS — Z993 Dependence on wheelchair: Secondary | ICD-10-CM | POA: Diagnosis not present

## 2020-07-03 DIAGNOSIS — M24551 Contracture, right hip: Secondary | ICD-10-CM | POA: Diagnosis not present

## 2020-07-03 DIAGNOSIS — R1312 Dysphagia, oropharyngeal phase: Secondary | ICD-10-CM | POA: Diagnosis not present

## 2020-07-03 DIAGNOSIS — I6930 Unspecified sequelae of cerebral infarction: Secondary | ICD-10-CM | POA: Diagnosis not present

## 2020-07-03 DIAGNOSIS — R293 Abnormal posture: Secondary | ICD-10-CM | POA: Diagnosis not present

## 2020-07-03 DIAGNOSIS — M24541 Contracture, right hand: Secondary | ICD-10-CM | POA: Diagnosis not present

## 2020-07-04 DIAGNOSIS — R293 Abnormal posture: Secondary | ICD-10-CM | POA: Diagnosis not present

## 2020-07-04 DIAGNOSIS — I6930 Unspecified sequelae of cerebral infarction: Secondary | ICD-10-CM | POA: Diagnosis not present

## 2020-07-04 DIAGNOSIS — Z993 Dependence on wheelchair: Secondary | ICD-10-CM | POA: Diagnosis not present

## 2020-07-04 DIAGNOSIS — M24551 Contracture, right hip: Secondary | ICD-10-CM | POA: Diagnosis not present

## 2020-07-04 DIAGNOSIS — M24541 Contracture, right hand: Secondary | ICD-10-CM | POA: Diagnosis not present

## 2020-07-04 DIAGNOSIS — R1312 Dysphagia, oropharyngeal phase: Secondary | ICD-10-CM | POA: Diagnosis not present

## 2020-07-05 DIAGNOSIS — I6930 Unspecified sequelae of cerebral infarction: Secondary | ICD-10-CM | POA: Diagnosis not present

## 2020-07-05 DIAGNOSIS — R1312 Dysphagia, oropharyngeal phase: Secondary | ICD-10-CM | POA: Diagnosis not present

## 2020-07-05 DIAGNOSIS — R293 Abnormal posture: Secondary | ICD-10-CM | POA: Diagnosis not present

## 2020-07-05 DIAGNOSIS — Z993 Dependence on wheelchair: Secondary | ICD-10-CM | POA: Diagnosis not present

## 2020-07-05 DIAGNOSIS — M24551 Contracture, right hip: Secondary | ICD-10-CM | POA: Diagnosis not present

## 2020-07-05 DIAGNOSIS — M24541 Contracture, right hand: Secondary | ICD-10-CM | POA: Diagnosis not present

## 2020-07-07 DIAGNOSIS — R1312 Dysphagia, oropharyngeal phase: Secondary | ICD-10-CM | POA: Diagnosis not present

## 2020-07-07 DIAGNOSIS — Z993 Dependence on wheelchair: Secondary | ICD-10-CM | POA: Diagnosis not present

## 2020-07-07 DIAGNOSIS — R293 Abnormal posture: Secondary | ICD-10-CM | POA: Diagnosis not present

## 2020-07-07 DIAGNOSIS — I6930 Unspecified sequelae of cerebral infarction: Secondary | ICD-10-CM | POA: Diagnosis not present

## 2020-07-07 DIAGNOSIS — M24541 Contracture, right hand: Secondary | ICD-10-CM | POA: Diagnosis not present

## 2020-07-07 DIAGNOSIS — M24551 Contracture, right hip: Secondary | ICD-10-CM | POA: Diagnosis not present

## 2020-07-09 DIAGNOSIS — I6930 Unspecified sequelae of cerebral infarction: Secondary | ICD-10-CM | POA: Diagnosis not present

## 2020-07-09 DIAGNOSIS — M24541 Contracture, right hand: Secondary | ICD-10-CM | POA: Diagnosis not present

## 2020-07-09 DIAGNOSIS — M24551 Contracture, right hip: Secondary | ICD-10-CM | POA: Diagnosis not present

## 2020-07-09 DIAGNOSIS — R293 Abnormal posture: Secondary | ICD-10-CM | POA: Diagnosis not present

## 2020-07-09 DIAGNOSIS — R1312 Dysphagia, oropharyngeal phase: Secondary | ICD-10-CM | POA: Diagnosis not present

## 2020-07-09 DIAGNOSIS — Z993 Dependence on wheelchair: Secondary | ICD-10-CM | POA: Diagnosis not present

## 2020-07-10 DIAGNOSIS — I6930 Unspecified sequelae of cerebral infarction: Secondary | ICD-10-CM | POA: Diagnosis not present

## 2020-07-10 DIAGNOSIS — R293 Abnormal posture: Secondary | ICD-10-CM | POA: Diagnosis not present

## 2020-07-10 DIAGNOSIS — Z993 Dependence on wheelchair: Secondary | ICD-10-CM | POA: Diagnosis not present

## 2020-07-10 DIAGNOSIS — M24541 Contracture, right hand: Secondary | ICD-10-CM | POA: Diagnosis not present

## 2020-07-10 DIAGNOSIS — M24551 Contracture, right hip: Secondary | ICD-10-CM | POA: Diagnosis not present

## 2020-07-11 DIAGNOSIS — K660 Peritoneal adhesions (postprocedural) (postinfection): Secondary | ICD-10-CM | POA: Diagnosis not present

## 2020-07-11 DIAGNOSIS — L89301 Pressure ulcer of unspecified buttock, stage 1: Secondary | ICD-10-CM | POA: Diagnosis not present

## 2020-07-11 DIAGNOSIS — Z993 Dependence on wheelchair: Secondary | ICD-10-CM | POA: Diagnosis not present

## 2020-07-11 DIAGNOSIS — I6930 Unspecified sequelae of cerebral infarction: Secondary | ICD-10-CM | POA: Diagnosis not present

## 2020-07-11 DIAGNOSIS — M24541 Contracture, right hand: Secondary | ICD-10-CM | POA: Diagnosis not present

## 2020-07-11 DIAGNOSIS — M24551 Contracture, right hip: Secondary | ICD-10-CM | POA: Diagnosis not present

## 2020-07-11 DIAGNOSIS — R293 Abnormal posture: Secondary | ICD-10-CM | POA: Diagnosis not present

## 2020-07-12 DIAGNOSIS — M24551 Contracture, right hip: Secondary | ICD-10-CM | POA: Diagnosis not present

## 2020-07-12 DIAGNOSIS — I6930 Unspecified sequelae of cerebral infarction: Secondary | ICD-10-CM | POA: Diagnosis not present

## 2020-07-12 DIAGNOSIS — M24541 Contracture, right hand: Secondary | ICD-10-CM | POA: Diagnosis not present

## 2020-07-12 DIAGNOSIS — Z993 Dependence on wheelchair: Secondary | ICD-10-CM | POA: Diagnosis not present

## 2020-07-12 DIAGNOSIS — R293 Abnormal posture: Secondary | ICD-10-CM | POA: Diagnosis not present

## 2020-07-13 DIAGNOSIS — I6930 Unspecified sequelae of cerebral infarction: Secondary | ICD-10-CM | POA: Diagnosis not present

## 2020-07-13 DIAGNOSIS — Z993 Dependence on wheelchair: Secondary | ICD-10-CM | POA: Diagnosis not present

## 2020-07-13 DIAGNOSIS — M24541 Contracture, right hand: Secondary | ICD-10-CM | POA: Diagnosis not present

## 2020-07-13 DIAGNOSIS — R293 Abnormal posture: Secondary | ICD-10-CM | POA: Diagnosis not present

## 2020-07-13 DIAGNOSIS — M24551 Contracture, right hip: Secondary | ICD-10-CM | POA: Diagnosis not present

## 2020-07-16 DIAGNOSIS — M24541 Contracture, right hand: Secondary | ICD-10-CM | POA: Diagnosis not present

## 2020-07-16 DIAGNOSIS — R293 Abnormal posture: Secondary | ICD-10-CM | POA: Diagnosis not present

## 2020-07-16 DIAGNOSIS — Z993 Dependence on wheelchair: Secondary | ICD-10-CM | POA: Diagnosis not present

## 2020-07-16 DIAGNOSIS — M24551 Contracture, right hip: Secondary | ICD-10-CM | POA: Diagnosis not present

## 2020-07-16 DIAGNOSIS — I6930 Unspecified sequelae of cerebral infarction: Secondary | ICD-10-CM | POA: Diagnosis not present

## 2020-07-17 DIAGNOSIS — I6930 Unspecified sequelae of cerebral infarction: Secondary | ICD-10-CM | POA: Diagnosis not present

## 2020-07-17 DIAGNOSIS — M24541 Contracture, right hand: Secondary | ICD-10-CM | POA: Diagnosis not present

## 2020-07-17 DIAGNOSIS — Z993 Dependence on wheelchair: Secondary | ICD-10-CM | POA: Diagnosis not present

## 2020-07-17 DIAGNOSIS — R293 Abnormal posture: Secondary | ICD-10-CM | POA: Diagnosis not present

## 2020-07-17 DIAGNOSIS — M24551 Contracture, right hip: Secondary | ICD-10-CM | POA: Diagnosis not present

## 2020-07-18 DIAGNOSIS — Z993 Dependence on wheelchair: Secondary | ICD-10-CM | POA: Diagnosis not present

## 2020-07-18 DIAGNOSIS — M24551 Contracture, right hip: Secondary | ICD-10-CM | POA: Diagnosis not present

## 2020-07-18 DIAGNOSIS — R293 Abnormal posture: Secondary | ICD-10-CM | POA: Diagnosis not present

## 2020-07-18 DIAGNOSIS — M24541 Contracture, right hand: Secondary | ICD-10-CM | POA: Diagnosis not present

## 2020-07-18 DIAGNOSIS — I6930 Unspecified sequelae of cerebral infarction: Secondary | ICD-10-CM | POA: Diagnosis not present

## 2020-07-19 DIAGNOSIS — Z993 Dependence on wheelchair: Secondary | ICD-10-CM | POA: Diagnosis not present

## 2020-07-19 DIAGNOSIS — R293 Abnormal posture: Secondary | ICD-10-CM | POA: Diagnosis not present

## 2020-07-19 DIAGNOSIS — M24541 Contracture, right hand: Secondary | ICD-10-CM | POA: Diagnosis not present

## 2020-07-19 DIAGNOSIS — I6930 Unspecified sequelae of cerebral infarction: Secondary | ICD-10-CM | POA: Diagnosis not present

## 2020-07-19 DIAGNOSIS — M24551 Contracture, right hip: Secondary | ICD-10-CM | POA: Diagnosis not present

## 2020-07-20 DIAGNOSIS — M24541 Contracture, right hand: Secondary | ICD-10-CM | POA: Diagnosis not present

## 2020-07-20 DIAGNOSIS — I6930 Unspecified sequelae of cerebral infarction: Secondary | ICD-10-CM | POA: Diagnosis not present

## 2020-07-20 DIAGNOSIS — R293 Abnormal posture: Secondary | ICD-10-CM | POA: Diagnosis not present

## 2020-07-20 DIAGNOSIS — M24551 Contracture, right hip: Secondary | ICD-10-CM | POA: Diagnosis not present

## 2020-07-20 DIAGNOSIS — Z993 Dependence on wheelchair: Secondary | ICD-10-CM | POA: Diagnosis not present

## 2020-07-23 DIAGNOSIS — R293 Abnormal posture: Secondary | ICD-10-CM | POA: Diagnosis not present

## 2020-07-23 DIAGNOSIS — Z993 Dependence on wheelchair: Secondary | ICD-10-CM | POA: Diagnosis not present

## 2020-07-23 DIAGNOSIS — I6930 Unspecified sequelae of cerebral infarction: Secondary | ICD-10-CM | POA: Diagnosis not present

## 2020-07-23 DIAGNOSIS — M24551 Contracture, right hip: Secondary | ICD-10-CM | POA: Diagnosis not present

## 2020-07-23 DIAGNOSIS — M24541 Contracture, right hand: Secondary | ICD-10-CM | POA: Diagnosis not present

## 2020-07-24 DIAGNOSIS — M24541 Contracture, right hand: Secondary | ICD-10-CM | POA: Diagnosis not present

## 2020-07-24 DIAGNOSIS — M24551 Contracture, right hip: Secondary | ICD-10-CM | POA: Diagnosis not present

## 2020-07-24 DIAGNOSIS — I6930 Unspecified sequelae of cerebral infarction: Secondary | ICD-10-CM | POA: Diagnosis not present

## 2020-07-24 DIAGNOSIS — Z993 Dependence on wheelchair: Secondary | ICD-10-CM | POA: Diagnosis not present

## 2020-07-24 DIAGNOSIS — R293 Abnormal posture: Secondary | ICD-10-CM | POA: Diagnosis not present

## 2020-07-26 DIAGNOSIS — I5020 Unspecified systolic (congestive) heart failure: Secondary | ICD-10-CM | POA: Diagnosis not present

## 2020-07-26 DIAGNOSIS — R0902 Hypoxemia: Secondary | ICD-10-CM | POA: Diagnosis not present

## 2020-07-26 DIAGNOSIS — R062 Wheezing: Secondary | ICD-10-CM | POA: Diagnosis not present

## 2020-07-27 DIAGNOSIS — R4189 Other symptoms and signs involving cognitive functions and awareness: Secondary | ICD-10-CM | POA: Diagnosis not present

## 2020-07-27 DIAGNOSIS — I5032 Chronic diastolic (congestive) heart failure: Secondary | ICD-10-CM | POA: Diagnosis not present

## 2020-07-27 DIAGNOSIS — I5020 Unspecified systolic (congestive) heart failure: Secondary | ICD-10-CM | POA: Diagnosis not present

## 2020-07-27 DIAGNOSIS — J9601 Acute respiratory failure with hypoxia: Secondary | ICD-10-CM | POA: Diagnosis not present

## 2020-07-27 DIAGNOSIS — N390 Urinary tract infection, site not specified: Secondary | ICD-10-CM | POA: Diagnosis not present

## 2020-07-30 ENCOUNTER — Emergency Department (HOSPITAL_COMMUNITY): Payer: Medicare Other

## 2020-07-30 ENCOUNTER — Inpatient Hospital Stay (HOSPITAL_COMMUNITY)
Admission: EM | Admit: 2020-07-30 | Discharge: 2020-08-07 | DRG: 291 | Disposition: A | Discharge status: Skilled Nursing Facility | Payer: Medicare Other | Source: Skilled Nursing Facility | Attending: Internal Medicine | Admitting: Internal Medicine

## 2020-07-30 ENCOUNTER — Encounter (HOSPITAL_COMMUNITY): Payer: Self-pay

## 2020-07-30 DIAGNOSIS — I4821 Permanent atrial fibrillation: Secondary | ICD-10-CM | POA: Diagnosis present

## 2020-07-30 DIAGNOSIS — Z66 Do not resuscitate: Secondary | ICD-10-CM | POA: Diagnosis present

## 2020-07-30 DIAGNOSIS — G40909 Epilepsy, unspecified, not intractable, without status epilepticus: Secondary | ICD-10-CM

## 2020-07-30 DIAGNOSIS — Z87891 Personal history of nicotine dependence: Secondary | ICD-10-CM | POA: Diagnosis not present

## 2020-07-30 DIAGNOSIS — M2550 Pain in unspecified joint: Secondary | ICD-10-CM | POA: Diagnosis not present

## 2020-07-30 DIAGNOSIS — Z6841 Body Mass Index (BMI) 40.0 and over, adult: Secondary | ICD-10-CM | POA: Diagnosis not present

## 2020-07-30 DIAGNOSIS — E662 Morbid (severe) obesity with alveolar hypoventilation: Secondary | ICD-10-CM | POA: Diagnosis present

## 2020-07-30 DIAGNOSIS — I4891 Unspecified atrial fibrillation: Secondary | ICD-10-CM | POA: Diagnosis not present

## 2020-07-30 DIAGNOSIS — D6859 Other primary thrombophilia: Secondary | ICD-10-CM | POA: Diagnosis present

## 2020-07-30 DIAGNOSIS — E785 Hyperlipidemia, unspecified: Secondary | ICD-10-CM | POA: Diagnosis present

## 2020-07-30 DIAGNOSIS — F32A Depression, unspecified: Secondary | ICD-10-CM | POA: Diagnosis present

## 2020-07-30 DIAGNOSIS — G9341 Metabolic encephalopathy: Secondary | ICD-10-CM

## 2020-07-30 DIAGNOSIS — Z79899 Other long term (current) drug therapy: Secondary | ICD-10-CM

## 2020-07-30 DIAGNOSIS — Z7401 Bed confinement status: Secondary | ICD-10-CM

## 2020-07-30 DIAGNOSIS — R4182 Altered mental status, unspecified: Secondary | ICD-10-CM | POA: Diagnosis not present

## 2020-07-30 DIAGNOSIS — Z515 Encounter for palliative care: Secondary | ICD-10-CM | POA: Diagnosis not present

## 2020-07-30 DIAGNOSIS — G459 Transient cerebral ischemic attack, unspecified: Secondary | ICD-10-CM | POA: Diagnosis not present

## 2020-07-30 DIAGNOSIS — I5033 Acute on chronic diastolic (congestive) heart failure: Secondary | ICD-10-CM | POA: Diagnosis not present

## 2020-07-30 DIAGNOSIS — I509 Heart failure, unspecified: Secondary | ICD-10-CM | POA: Diagnosis not present

## 2020-07-30 DIAGNOSIS — I712 Thoracic aortic aneurysm, without rupture: Secondary | ICD-10-CM | POA: Diagnosis present

## 2020-07-30 DIAGNOSIS — E7800 Pure hypercholesterolemia, unspecified: Secondary | ICD-10-CM | POA: Diagnosis not present

## 2020-07-30 DIAGNOSIS — J9621 Acute and chronic respiratory failure with hypoxia: Secondary | ICD-10-CM | POA: Diagnosis present

## 2020-07-30 DIAGNOSIS — R0902 Hypoxemia: Secondary | ICD-10-CM | POA: Diagnosis not present

## 2020-07-30 DIAGNOSIS — F419 Anxiety disorder, unspecified: Secondary | ICD-10-CM | POA: Diagnosis present

## 2020-07-30 DIAGNOSIS — Z86718 Personal history of other venous thrombosis and embolism: Secondary | ICD-10-CM

## 2020-07-30 DIAGNOSIS — J9622 Acute and chronic respiratory failure with hypercapnia: Secondary | ICD-10-CM | POA: Diagnosis not present

## 2020-07-30 DIAGNOSIS — R0602 Shortness of breath: Secondary | ICD-10-CM | POA: Diagnosis not present

## 2020-07-30 DIAGNOSIS — I69351 Hemiplegia and hemiparesis following cerebral infarction affecting right dominant side: Secondary | ICD-10-CM | POA: Diagnosis not present

## 2020-07-30 DIAGNOSIS — J9601 Acute respiratory failure with hypoxia: Secondary | ICD-10-CM | POA: Diagnosis not present

## 2020-07-30 DIAGNOSIS — Z7901 Long term (current) use of anticoagulants: Secondary | ICD-10-CM | POA: Diagnosis not present

## 2020-07-30 DIAGNOSIS — Z20822 Contact with and (suspected) exposure to covid-19: Secondary | ICD-10-CM | POA: Diagnosis present

## 2020-07-30 DIAGNOSIS — I517 Cardiomegaly: Secondary | ICD-10-CM | POA: Diagnosis not present

## 2020-07-30 DIAGNOSIS — R778 Other specified abnormalities of plasma proteins: Secondary | ICD-10-CM | POA: Diagnosis present

## 2020-07-30 DIAGNOSIS — Z7189 Other specified counseling: Secondary | ICD-10-CM | POA: Diagnosis not present

## 2020-07-30 DIAGNOSIS — I110 Hypertensive heart disease with heart failure: Secondary | ICD-10-CM | POA: Diagnosis present

## 2020-07-30 DIAGNOSIS — R404 Transient alteration of awareness: Secondary | ICD-10-CM | POA: Diagnosis not present

## 2020-07-30 DIAGNOSIS — R4189 Other symptoms and signs involving cognitive functions and awareness: Secondary | ICD-10-CM | POA: Diagnosis not present

## 2020-07-30 DIAGNOSIS — E66813 Obesity, class 3: Secondary | ICD-10-CM | POA: Diagnosis present

## 2020-07-30 DIAGNOSIS — Z9981 Dependence on supplemental oxygen: Secondary | ICD-10-CM

## 2020-07-30 DIAGNOSIS — I5032 Chronic diastolic (congestive) heart failure: Secondary | ICD-10-CM | POA: Diagnosis not present

## 2020-07-30 DIAGNOSIS — I7121 Aneurysm of the ascending aorta, without rupture: Secondary | ICD-10-CM

## 2020-07-30 DIAGNOSIS — R410 Disorientation, unspecified: Secondary | ICD-10-CM | POA: Diagnosis not present

## 2020-07-30 DIAGNOSIS — I5023 Acute on chronic systolic (congestive) heart failure: Secondary | ICD-10-CM | POA: Diagnosis not present

## 2020-07-30 LAB — PROTIME-INR
INR: 1.7 — ABNORMAL HIGH (ref 0.8–1.2)
Prothrombin Time: 19 seconds — ABNORMAL HIGH (ref 11.4–15.2)

## 2020-07-30 LAB — URINALYSIS, ROUTINE W REFLEX MICROSCOPIC
Bacteria, UA: NONE SEEN
Bilirubin Urine: NEGATIVE
Glucose, UA: NEGATIVE mg/dL
Hgb urine dipstick: NEGATIVE
Ketones, ur: NEGATIVE mg/dL
Leukocytes,Ua: NEGATIVE
Nitrite: NEGATIVE
Protein, ur: 30 mg/dL — AB
Specific Gravity, Urine: 1.021 (ref 1.005–1.030)
pH: 5 (ref 5.0–8.0)

## 2020-07-30 LAB — CBC WITH DIFFERENTIAL/PLATELET
Abs Immature Granulocytes: 0.15 10*3/uL — ABNORMAL HIGH (ref 0.00–0.07)
Basophils Absolute: 0.1 10*3/uL (ref 0.0–0.1)
Basophils Relative: 1 %
Eosinophils Absolute: 0 10*3/uL (ref 0.0–0.5)
Eosinophils Relative: 0 %
HCT: 47.8 % (ref 39.0–52.0)
Hemoglobin: 15.1 g/dL (ref 13.0–17.0)
Immature Granulocytes: 2 %
Lymphocytes Relative: 8 %
Lymphs Abs: 0.7 10*3/uL (ref 0.7–4.0)
MCH: 30.4 pg (ref 26.0–34.0)
MCHC: 31.6 g/dL (ref 30.0–36.0)
MCV: 96.4 fL (ref 80.0–100.0)
Monocytes Absolute: 1.1 10*3/uL — ABNORMAL HIGH (ref 0.1–1.0)
Monocytes Relative: 11 %
Neutro Abs: 7.5 10*3/uL (ref 1.7–7.7)
Neutrophils Relative %: 78 %
Platelets: 279 10*3/uL (ref 150–400)
RBC: 4.96 MIL/uL (ref 4.22–5.81)
RDW: 14.6 % (ref 11.5–15.5)
WBC: 9.5 10*3/uL (ref 4.0–10.5)
nRBC: 0.2 % (ref 0.0–0.2)

## 2020-07-30 LAB — I-STAT CHEM 8, ED
BUN: 17 mg/dL (ref 8–23)
Calcium, Ion: 1.1 mmol/L — ABNORMAL LOW (ref 1.15–1.40)
Chloride: 86 mmol/L — ABNORMAL LOW (ref 98–111)
Creatinine, Ser: 0.8 mg/dL (ref 0.61–1.24)
Glucose, Bld: 124 mg/dL — ABNORMAL HIGH (ref 70–99)
HCT: 49 % (ref 39.0–52.0)
Hemoglobin: 16.7 g/dL (ref 13.0–17.0)
Potassium: 4.3 mmol/L (ref 3.5–5.1)
Sodium: 129 mmol/L — ABNORMAL LOW (ref 135–145)
TCO2: 35 mmol/L — ABNORMAL HIGH (ref 22–32)

## 2020-07-30 LAB — COMPREHENSIVE METABOLIC PANEL
ALT: 29 U/L (ref 0–44)
AST: 25 U/L (ref 15–41)
Albumin: 3.1 g/dL — ABNORMAL LOW (ref 3.5–5.0)
Alkaline Phosphatase: 104 U/L (ref 38–126)
Anion gap: 11 (ref 5–15)
BUN: 14 mg/dL (ref 8–23)
CO2: 32 mmol/L (ref 22–32)
Calcium: 8.4 mg/dL — ABNORMAL LOW (ref 8.9–10.3)
Chloride: 88 mmol/L — ABNORMAL LOW (ref 98–111)
Creatinine, Ser: 0.75 mg/dL (ref 0.61–1.24)
GFR, Estimated: >60 mL/min (ref >60)
Glucose, Bld: 124 mg/dL — ABNORMAL HIGH (ref 70–99)
Potassium: 4.5 mmol/L (ref 3.5–5.1)
Sodium: 131 mmol/L — ABNORMAL LOW (ref 135–145)
Total Bilirubin: 0.8 mg/dL (ref 0.3–1.2)
Total Protein: 6.2 g/dL — ABNORMAL LOW (ref 6.5–8.1)

## 2020-07-30 LAB — TROPONIN I (HIGH SENSITIVITY)
Troponin I (High Sensitivity): 76 ng/L — ABNORMAL HIGH (ref <18)
Troponin I (High Sensitivity): 83 ng/L — ABNORMAL HIGH (ref <18)

## 2020-07-30 LAB — RESP PANEL BY RT-PCR (FLU A&B, COVID) ARPGX2
Influenza A by PCR: NEGATIVE
Influenza B by PCR: NEGATIVE
SARS Coronavirus 2 by RT PCR: NEGATIVE

## 2020-07-30 LAB — FERRITIN: Ferritin: 57 ng/mL (ref 24–336)

## 2020-07-30 LAB — I-STAT VENOUS BLOOD GAS, ED
Acid-Base Excess: 9 mmol/L — ABNORMAL HIGH (ref 0.0–2.0)
Bicarbonate: 39.2 mmol/L — ABNORMAL HIGH (ref 20.0–28.0)
Calcium, Ion: 1.11 mmol/L — ABNORMAL LOW (ref 1.15–1.40)
HCT: 48 % (ref 39.0–52.0)
Hemoglobin: 16.3 g/dL (ref 13.0–17.0)
O2 Saturation: 97 %
Potassium: 4.2 mmol/L (ref 3.5–5.1)
Sodium: 129 mmol/L — ABNORMAL LOW (ref 135–145)
TCO2: 42 mmol/L — ABNORMAL HIGH (ref 22–32)
pCO2, Ven: 81.2 mmHg (ref 44.0–60.0)
pH, Ven: 7.292 (ref 7.250–7.430)
pO2, Ven: 109 mmHg — ABNORMAL HIGH (ref 32.0–45.0)

## 2020-07-30 LAB — C-REACTIVE PROTEIN: CRP: 6.2 mg/dL — ABNORMAL HIGH (ref <1.0)

## 2020-07-30 LAB — LACTIC ACID, PLASMA: Lactic Acid, Venous: 1 mmol/L (ref 0.5–1.9)

## 2020-07-30 LAB — BRAIN NATRIURETIC PEPTIDE: B Natriuretic Peptide: 960.3 pg/mL — ABNORMAL HIGH (ref 0.0–100.0)

## 2020-07-30 LAB — FIBRINOGEN: Fibrinogen: 549 mg/dL — ABNORMAL HIGH (ref 210–475)

## 2020-07-30 LAB — TRIGLYCERIDES: Triglycerides: 91 mg/dL (ref <150)

## 2020-07-30 LAB — LACTATE DEHYDROGENASE: LDH: 217 U/L — ABNORMAL HIGH (ref 98–192)

## 2020-07-30 LAB — PROCALCITONIN: Procalcitonin: <0.1 ng/mL

## 2020-07-30 LAB — D-DIMER, QUANTITATIVE: D-Dimer, Quant: 0.57 ug/mL-FEU — ABNORMAL HIGH (ref 0.00–0.50)

## 2020-07-30 MED ORDER — FUROSEMIDE 10 MG/ML IJ SOLN
40.0000 mg | Freq: Once | INTRAMUSCULAR | Status: AC
  Administered 2020-07-30: 40 mg via INTRAVENOUS
  Filled 2020-07-30: qty 4

## 2020-07-30 MED ORDER — ROSUVASTATIN CALCIUM 20 MG PO TABS
40.0000 mg | ORAL_TABLET | Freq: Every day | ORAL | Status: DC
  Administered 2020-07-31 – 2020-08-06 (×7): 40 mg via ORAL
  Filled 2020-07-30 (×7): qty 2

## 2020-07-30 MED ORDER — EZETIMIBE 10 MG PO TABS
10.0000 mg | ORAL_TABLET | Freq: Every day | ORAL | Status: DC
  Administered 2020-08-01 – 2020-08-07 (×7): 10 mg via ORAL
  Filled 2020-07-30 (×8): qty 1

## 2020-07-30 MED ORDER — FUROSEMIDE 10 MG/ML IJ SOLN
40.0000 mg | Freq: Two times a day (BID) | INTRAMUSCULAR | Status: DC
  Administered 2020-07-31 – 2020-08-03 (×7): 40 mg via INTRAVENOUS
  Filled 2020-07-30 (×7): qty 4

## 2020-07-30 MED ORDER — ACETAMINOPHEN 325 MG PO TABS: 650.0000 mg | ORAL_TABLET | Freq: Four times a day (QID) | ORAL | Status: DC | PRN

## 2020-07-30 MED ORDER — SODIUM CHLORIDE 0.9% FLUSH
3.0000 mL | Freq: Two times a day (BID) | INTRAVENOUS | Status: DC
  Administered 2020-07-31 – 2020-08-07 (×14): 3 mL via INTRAVENOUS

## 2020-07-30 MED ORDER — PANTOPRAZOLE SODIUM 40 MG PO TBEC
40.0000 mg | DELAYED_RELEASE_TABLET | Freq: Every day | ORAL | Status: DC
  Administered 2020-07-31 – 2020-08-07 (×8): 40 mg via ORAL
  Filled 2020-07-30 (×8): qty 1

## 2020-07-30 MED ORDER — WARFARIN SODIUM 3 MG PO TABS
3.0000 mg | ORAL_TABLET | Freq: Once | ORAL | Status: DC
  Filled 2020-07-30: qty 1

## 2020-07-30 MED ORDER — PHENYTOIN SODIUM EXTENDED 100 MG PO CAPS
200.0000 mg | ORAL_CAPSULE | Freq: Two times a day (BID) | ORAL | Status: DC
  Administered 2020-07-31 – 2020-08-07 (×15): 200 mg via ORAL
  Filled 2020-07-30 (×17): qty 2

## 2020-07-30 MED ORDER — POLYETHYLENE GLYCOL 3350 17 G PO PACK
17.0000 g | PACK | Freq: Every day | ORAL | Status: DC | PRN
  Administered 2020-08-03: 17 g via ORAL
  Filled 2020-07-30: qty 1

## 2020-07-30 MED ORDER — SENNA 8.6 MG PO TABS
1.0000 | ORAL_TABLET | Freq: Two times a day (BID) | ORAL | Status: DC
  Administered 2020-07-31 – 2020-08-07 (×13): 8.6 mg via ORAL
  Filled 2020-07-30 (×15): qty 1

## 2020-07-30 MED ORDER — WARFARIN - PHARMACIST DOSING INPATIENT: Freq: Every day | Status: DC

## 2020-07-30 MED ORDER — SODIUM CHLORIDE 0.9 % IV SOLN
250.0000 mL | INTRAVENOUS | Status: DC | PRN
Start: 2020-07-30 — End: 2020-08-07

## 2020-07-30 MED ORDER — BISACODYL 10 MG RE SUPP: 10.0000 mg | Freq: Every day | RECTAL | Status: DC | PRN

## 2020-07-30 MED ORDER — ACETAMINOPHEN 650 MG RE SUPP: 650.0000 mg | Freq: Four times a day (QID) | RECTAL | Status: DC | PRN

## 2020-07-30 MED ORDER — CYCLOSPORINE 0.05 % OP EMUL
1.0000 [drp] | Freq: Two times a day (BID) | OPHTHALMIC | Status: DC
  Administered 2020-07-31 – 2020-08-07 (×14): 1 [drp] via OPHTHALMIC
  Filled 2020-07-30 (×16): qty 1

## 2020-07-30 MED ORDER — SODIUM CHLORIDE 0.9% FLUSH
3.0000 mL | INTRAVENOUS | Status: DC | PRN
Start: 2020-07-30 — End: 2020-08-07

## 2020-07-30 NOTE — Progress Notes (Signed)
ANTICOAGULATION CONSULT NOTE   Pharmacy Consult for Warfarin Indication: atrial fibrillation  Allergies  Allergen Reactions  . Fenofibrate Other (See Comments)    Per [facility]    Patient Measurements: Height: 6\' 1"  (185.4 cm) Weight: 135.6 kg (298 lb 15.1 oz) IBW/kg (Calculated) : 79.9 Heparin Dosing Weight:   Vital Signs: Temp: 97.6 F (36.4 C) (02/21 1923) Temp Source: Oral (02/21 1923) BP: 100/71 (02/21 2245) Pulse Rate: 52 (02/21 2245)  Labs: Recent Labs    07/30/20 1627 07/30/20 1645 07/30/20 1821  HGB 15.1 16.3  16.7  --   HCT 47.8 48.0  49.0  --   PLT 279  --   --   LABPROT 19.0*  --   --   INR 1.7*  --   --   CREATININE 0.75 0.80  --   TROPONINIHS 76*  --  83*    Estimated Creatinine Clearance: 138.4 mL/min (by C-G formula based on SCr of 0.8 mg/dL).   Medical History: Past Medical History:  Diagnosis Date  . Acute on chronic congestive heart failure (HCC)   . Anemia, secondary    SECONDARY TO ACUTE BLOOD LOSS  . Anxiety disorder   . Bloody stool 08/29/2011   intermittent along with constipation.   . 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 Depression   . 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 Dysphagia   . Hyperlipidemia   . Hypertension   . Lupus anticoagulant disorder (HCC) 1990  . Morbid obesity (HCC)   . PFO (patent foramen ovale)    TEE 2/10: EF 60%, trivial AI, mild Ao root dilatation, mod PFO with R-L shunting, atrial septal aneurysm;   echo 7/12: EF 65-70%, grade 1 diast dysfxn, mild MR, LVOT showed severe obstruction  . Protein C deficiency (HCC)   . Protein S deficiency (HCC)   . Rectus sheath hematoma 7/12   required reversal of anticoagulation and c/b DVT req. IVC filter  . Seizure disorder (HCC)     Medications:  Scheduled:  . [START ON 07/31/2020] furosemide  40 mg Intravenous Q12H  . warfarin  3 mg Oral Once  . [START  ON 07/31/2020] Warfarin - Pharmacist Dosing Inpatient   Does not apply q1600    Assessment: Patient is a 13 yom that is being admitted for AMS. The patient is on warfarin PTA for afib last dose appears to be on 2/19. The nursing home [facility] is difficult to read with multiple changes in warfarin order. Most recent regimen appears to be Warfarin 3mg  daily.   Goal of Therapy:  INR 2-3 Monitor platelets by anticoagulation protocol: Yes   Plan:  - INR currently subtherapeutic at 1.7  - Will give Warfarin 3mg  PO x 1 dose tonight  - Daily PT-INR while inpatient and adjust warfarin as needed  - Monitor for s/s of bleeding and cbc   [facility] PharmD. BCPS  07/30/2020,11:07 PM

## 2020-07-30 NOTE — Progress Notes (Addendum)
EDP notified of patient not triggering BIPAP. Patient's RR on BIPAP increased to 20. Patient placed on BIPAP due to high CO2/DNR code status.

## 2020-07-30 NOTE — ED Triage Notes (Signed)
Pt arrived via [facility] from [facility] for hypoxia and increased weakness at 0900 today. Pt has right sided deficits/weakness from prior stroke. Unknown LKW. Per EMS the facility gave pt additional lasix 20mg  and a duoneb w/o improvement so they sent him here. Pt has 3+ edema of LE bilat. Pt lethargic. Pt is A-fib on monitor

## 2020-07-30 NOTE — ED Notes (Signed)
Pt given a cup of ginger ale.

## 2020-07-30 NOTE — ED Notes (Signed)
Pt is currently on bipap.

## 2020-07-30 NOTE — ED Provider Notes (Signed)
[facility]   CSN: [id number] Arrival date & time: 07/30/20  1539     History Chief Complaint  Patient presents with  . Altered Mental Status    Dierdre ForthDavid B Blackburn is a 65 y.o. male hx of CHF, HL, HTN, here presenting with altered mental status and hypoxia. Patient is from a nursing home. He was noted to be altered and was hypoxic to 90% on non breather. Patient was noted to have worsening leg swelling and his lasix was increased to 40 mg today. Patient was noted to be hypoxic to 50 % on RA in the ED.  Patient is not normally on oxygen at baseline.  Patient is from nursing home and unclear if he has any Covid exposures.  Patient is on Coumadin for afib.   The history is provided by the patient.       Past Medical History:  Diagnosis Date  . Acute on chronic congestive heart failure (HCC)   . Anemia, secondary    SECONDARY TO ACUTE BLOOD LOSS  . Anxiety disorder   . Bloody stool 08/29/2011   intermittent along with constipation.   . 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 Depression   . 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 Dysphagia   . Hyperlipidemia   . Hypertension   . Lupus anticoagulant disorder (HCC) 1990  . Morbid obesity (HCC)   . PFO (patent foramen ovale)    TEE 2/10: EF 60%, trivial AI, mild Ao root dilatation, mod PFO with R-L shunting, atrial septal aneurysm;   echo 7/12: EF 65-70%, grade 1 diast dysfxn, mild MR, LVOT showed severe obstruction  . Protein C deficiency (HCC)   . Protein S deficiency (HCC)   . Rectus sheath hematoma 7/12   required reversal of anticoagulation and c/b DVT req. IVC filter  . Seizure disorder [facility](HCC)     Patient Active Problem List   Diagnosis Date Noted  . CHF exacerbation (HCC) 07/30/2020  . Pressure injury of skin 02/22/2020  . AKI (acute kidney injury) (HCC)   . HCAP  (healthcare-associated pneumonia)   . Acute metabolic encephalopathy   . Hypercoagulable state (HCC)   . Seizure disorder (HCC)   . Chronic diastolic CHF (congestive heart failure) (HCC)   . Atrial fibrillation, chronic (HCC)   . History of CVA (cerebrovascular accident)   . Hyperlipidemia   . Acute respiratory failure (HCC) 02/20/2020  . CAP (community acquired pneumonia) 02/20/2020  . Obesity, Class III, BMI 40-49.9 (morbid obesity) (HCC) 02/20/2020  . Bradycardia 02/20/2020  . Hyperkalemia 02/20/2020  . Hypercapnic respiratory failure (HCC) 02/20/2020  . Acute on chronic congestive heart failure (HCC)   . New onset atrial fibrillation (HCC)   . Chronic anticoagulation   . Respiratory failure (HCC) 08/02/2018  . Anemia of other chronic disease 11/23/2013  . Pain in joint, ankle and foot 11/14/2013  . Other convulsions 10/19/2013  . Noninfectious gastroenteritis and colitis 07/29/2013  . Left upper quadrant abdominal mass 07/28/2013  . Other specified disease of white blood cells 07/16/2013  . Ingrowing toenail with infection 05/20/2013  . Late effects of cerebrovascular disease 10/15/2012  . Pure hypercholesterolemia 10/15/2012  . Essential hypertension, benign 10/15/2012  . Hereditary and idiopathic peripheral neuropathy 10/15/2012  . 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 Cerebral  infarction (HCC) 08/29/2011  . DVT (deep venous thrombosis) (HCC) 08/29/2011  . Bloody stool 08/29/2011  . PFO (patent foramen ovale) 02/18/2011    Past Surgical History:  Procedure Laterality Date  . dental extraction     multiple  . vena cavogram  12/2010   INFERIOR       No family history on file.  Social History   Tobacco Use  . Smoking status: Former Smoker    Packs/day: 1.00    Years: 15.00    Pack years: 15.00    Types: Cigarettes, Cigars    Quit date: 2010    Years since quitting: 12.1  . Smokeless tobacco: Never Used  Vaping  Use  . Vaping Use: Never used  Substance Use Topics  . 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 Drug use: No    Home Medications Prior to Admission medications   Medication Sig Start Date End Date Taking? Authorizing Provider  acetaminophen (TYLENOL) 325 MG tablet Take 650 mg by mouth every 6 (six) hours as needed for mild pain or headache.     [provider]  Carboxymeth-Glycerin-Polysorb (REFRESH OPTIVE ADVANCED OP) Place 1 drop into both eyes 4 (four) times daily. Wait 3-5 minutes between eye meds    [provider]  enoxaparin (LOVENOX) 150 MG/ML injection Inject 0.94 mLs (140 mg total) into the skin every 12 (twelve) hours for 5 days. 02/28/20 03/04/20  Rodolph Bong, MD  ezetimibe (ZETIA) 10 MG tablet Take 10 mg by mouth daily.    [provider]  fish oil-omega-3 fatty acids 1000 MG capsule Take 1 g by mouth 2 (two) times daily.     [provider]  furosemide (LASIX) 40 MG tablet Take 40 mg by mouth 2 (two) times daily.    [provider]  gabapentin (NEURONTIN) 300 MG capsule Take 300 mg by mouth 3 (three) times daily.      [provider]  guaifenesin (ROBITUSSIN) 100 MG/5ML syrup Take 200 mg by mouth every 4 (four) hours as needed for cough.    [provider]  hydrOXYzine (ATARAX/VISTARIL) 10 MG tablet Take 10 mg by mouth 3 (three) times daily as needed for itching.    [provider]  metoprolol tartrate (LOPRESSOR) 25 MG tablet Take 0.5 tablets (12.5 mg total) by mouth 2 (two) times daily. 12/28/18   Robbie Lis M, PA-C  omeprazole (PRILOSEC) 40 MG capsule Take 1 capsule (40 mg total) by mouth daily. 02/28/20   Rodolph Bong, MD  ondansetron (ZOFRAN) 4 MG tablet Take 4 mg by mouth every 8 (eight) hours as needed for nausea or vomiting.  06/09/19   [provider]  phenytoin (DILANTIN) 100 MG ER capsule Take 200 mg by mouth 2 (two) times daily.    [provider]   RESTASIS 0.05 % ophthalmic emulsion Place 1 drop into both eyes 2 (two) times daily. 06/03/18   [provider]  rosuvastatin (CRESTOR) 40 MG tablet Take 40 mg by mouth at bedtime.     [provider]  Skin Protectants, Misc. (EUCERIN) cream Apply 1 application topically daily as needed for dry skin.     [provider]  spironolactone (ALDACTONE) 25 MG tablet Take 1 tablet (25 mg total) by mouth daily. 08/07/18   Alwyn Ren, MD  traZODone (DESYREL) 50 MG tablet Take 50 mg by mouth at bedtime. 07/08/18   [provider]  triamcinolone cream (KENALOG) 0.1 %  Apply 1 application topically daily as needed (rash).    [provider]  venlafaxine (EFFEXOR) 75 MG tablet Take 75 mg by mouth daily. 07/15/18   [provider]  warfarin (COUMADIN) 5 MG tablet Take 1 tablet (5 mg total) by mouth daily. 02/28/20   Rodolph Bong, MD    Allergies    Fenofibrate  Review of Systems   Review of Systems  Cardiovascular: Positive for leg swelling.  All other systems reviewed and are negative.   Physical Exam Updated Vital Signs BP 124/78   Pulse 98   Temp 97.6 F (36.4 C) (Oral)   Resp 10   Ht 6\' 1"  (1.854 m)   Wt 135.6 kg   SpO2 98%   BMI 39.44 kg/m   Physical Exam Vitals and nursing note reviewed.  Constitutional:      Appearance: Normal appearance.  HENT:     Head: Normocephalic.     Nose: Nose normal.     Mouth/Throat:     Mouth: Mucous membranes are moist.  Eyes:     Extraocular Movements: Extraocular movements intact.     Pupils: Pupils are equal, round, and reactive to light.  Cardiovascular:     Rate and Rhythm: Normal rate.     Pulses: Normal pulses.  Pulmonary:     Comments: Crackles bilateral bases  Abdominal:     General: Abdomen is flat.     Palpations: Abdomen is soft.  Musculoskeletal:     Cervical back: Normal range of motion and neck supple.     Comments: 2+ edema bilaterally   Skin:    General:  Skin is warm.     Capillary Refill: Capillary refill takes less than 2 seconds.  Neurological:     Mental Status: He is alert.     Comments: Confused, moving all extremities   Psychiatric:        Mood and Affect: Mood normal.     ED Results / Procedures / Treatments   Labs (all labs ordered are listed, but only abnormal results are displayed) Labs Reviewed  CBC WITH DIFFERENTIAL/PLATELET - Abnormal; Notable for the following components:      Result Value   Monocytes Absolute 1.1 (*)    Abs Immature Granulocytes 0.15 (*)    All other components within normal limits  COMPREHENSIVE METABOLIC PANEL - Abnormal; Notable for the following components:   Sodium 131 (*)    Chloride 88 (*)    Glucose, Bld 124 (*)    Calcium 8.4 (*)    Total Protein 6.2 (*)    Albumin 3.1 (*)    All other components within normal limits  D-DIMER, QUANTITATIVE - Abnormal; Notable for the following components:   D-Dimer, Quant 0.57 (*)    All other components within normal limits  LACTATE DEHYDROGENASE - Abnormal; Notable for the following components:   LDH 217 (*)    All other components within normal limits  FIBRINOGEN - Abnormal; Notable for the following components:   Fibrinogen 549 (*)    All other components within normal limits  C-REACTIVE PROTEIN - Abnormal; Notable for the following components:   CRP 6.2 (*)    All other components within normal limits  BRAIN NATRIURETIC PEPTIDE - Abnormal; Notable for the following components:   B Natriuretic Peptide 960.3 (*)    All other components within normal limits  PROTIME-INR - Abnormal; Notable for the following components:   Prothrombin Time 19.0 (*)    INR 1.7 (*)  All other components within normal limits  URINALYSIS, ROUTINE W REFLEX MICROSCOPIC - Abnormal; Notable for the following components:   Color, Urine AMBER (*)    APPearance HAZY (*)    Protein, ur 30 (*)    All other components within normal limits  I-STAT CHEM 8, ED - Abnormal;  Notable for the following components:   Sodium 129 (*)    Chloride 86 (*)    Glucose, Bld 124 (*)    Calcium, Ion 1.10 (*)    TCO2 35 (*)    All other components within normal limits  I-STAT VENOUS BLOOD GAS, ED - Abnormal; Notable for the following components:   pCO2, Ven 81.2 (*)    pO2, Ven 109.0 (*)    Bicarbonate 39.2 (*)    TCO2 42 (*)    Acid-Base Excess 9.0 (*)    Sodium 129 (*)    Calcium, Ion 1.11 (*)    All other components within normal limits  TROPONIN I (HIGH SENSITIVITY) - Abnormal; Notable for the following components:   Troponin I (High Sensitivity) 76 (*)    All other components within normal limits  RESP PANEL BY RT-PCR (FLU A&B, COVID) ARPGX2  CULTURE, BLOOD (ROUTINE X 2)  CULTURE, BLOOD (ROUTINE X 2)  LACTIC ACID, PLASMA  PROCALCITONIN  FERRITIN  TRIGLYCERIDES  LACTIC ACID, PLASMA  BLOOD GAS, VENOUS  TROPONIN I (HIGH SENSITIVITY)    EKG EKG Interpretation  Date/Time:  Monday July 30 2020 16:16:52 EST Ventricular Rate:  83 PR Interval:    QRS Duration: 152 QT Interval:  380 QTC Calculation: 447 R Axis:   100 Text Interpretation: Atrial fibrillation RBBB and LPFB ST depression, consider ischemia, diffuse lds No significant change since last tracing Confirmed by Rithy, Mandley ([id number]) on 07/30/2020 4:21:36 PM   Radiology DG Chest Port 1 View  Result Date: 07/30/2020 CLINICAL DATA:  Shortness of breath EXAM: PORTABLE CHEST 1 VIEW COMPARISON:  02/20/2020 FINDINGS: Cardiomegaly. Unchanged prominence of the pulmonary vasculature and mild, diffuse interstitial opacity. The visualized skeletal structures are unremarkable. IMPRESSION: Cardiomegaly with unchanged prominence of the pulmonary vasculature and mild, diffuse interstitial opacity, most consistent with edema. No focal airspace opacity. Electronically Signed   By: Lauralyn Primes M.D.   On: 07/30/2020 16:35    Procedures Procedures   Medications Ordered in ED Medications  furosemide (LASIX)  injection 40 mg (40 mg Intravenous Given 07/30/20 1744)    ED Course  I have reviewed the triage vital signs and the nursing notes.  Pertinent labs & imaging results that were available during my care of the patient were reviewed by me and considered in my medical decision making (see chart for details).    MDM Rules/Calculators/A&P                         RAMEY SCHIFF is a 65 y.o. male without shortness of breath and hypoxia.  Patient is hypoxic to 50%.  He also appears volume overloaded.  Consider Covid versus CHF.  Patient was given Lasix 40 mg today.  We will check COVID preadmission labs, troponin, BNP.  We will likely diurese patient  7:28 PM Patient's ABG showed pH of 7.3 and CO2 of 80.  He likely has chronic hypercapnia.  Patient's BNP is around 1000.  Patient's Covid test is negative.  Patient was given Lasix 40 mg.  Will admit for CHF exacerbation with hypoxia.   Final Clinical Impression(s) / ED Diagnoses Final diagnoses:  None    Rx / DC Orders ED Discharge Orders    None       Charlynne Pander, MD 07/30/20 [phone]

## 2020-07-30 NOTE — H&P (Addendum)
James Villegas DGU:[id number] DOB: 1956-06-05 DOA: 07/30/2020   PCP: Karna Dupes, MD   Outpatient Specialists:  CARDS: Dr. Gloris [place] [facility] Dr. Craige Cotta  Patient arrived to ER on 07/30/20 at 1539 Referred by Attending Charlynne Pander, MD   Patient coming from:   From facility [facility]  Chief Complaint:   Chief Complaint  Patient presents with  . Altered Mental Status    HPI: James Villegas is a 65 y.o. male with medical history significant of CVA with right side weakness, a.fib on coumadin, HTN, obesity, bradycardia chronic, hypercapnic respiratory failure chronic, seizure disorder, chronic diastolic CHF, history of stroke, HLD,    Presented with   hypoxia and increased weakness  Apparent to be more confused than his baseline. Poorly responsive. Also have had some leg swelling his Lasix was increased recently On arrival to emergency department was hypoxic down to 50% room air initially started on nonrebreather was able to be transferred to nasal cannula EMS the facility gave pt additional lasix 20mg  and a duoneb w/o improvement Pt unable to provide his own hx  Last admission in September with acute hypoxemic hypercarbic respiratory failure felt to be secondary to obstructive sleep apnea and hypoventilation syndrome As well as possible pneumonia Patient improved on BiPAP And was gently diuresed He was treated with IV cefepime and vancomycin  He was supposed to have pulmonary sleep study for BiPAP at the facility and outpatient follow-up Apparently this did not work out as patient needed referral to [facility] secondary to significant obesity needing Hoyer lift.  Infectious risk factors:  Reports none     Initial COVID TEST  NEGATIVE   Lab Results  Component Value Date   SARSCOV2NAA NEGATIVE 07/30/2020   SARSCOV2NAA NEGATIVE 02/27/2020   SARSCOV2NAA NEGATIVE 02/20/2020     Regarding pertinent Chronic problems:    Hyperlipidemia -  on statins  Zetia, Crestor Lipid Panel     Component Value Date/Time   CHOL 207 (H) 07/15/2019 1055   TRIG 91 07/30/2020 1627   HDL 60 07/15/2019 1055   CHOLHDL 3.5 07/15/2019 1055   CHOLHDL 3.8 08/05/2018 0401   VLDL 27 08/05/2018 0401   LDLCALC 122 (H) 07/15/2019 1055   LABVLDL 25 07/15/2019 1055    HTN on Lopressor   last echo march 2021 showing preserved EF On Lasix History of seizure disorders on Dilantin     Morbid obesity-   BMI Readings from Last 1 Encounters:  07/30/20 39.44 kg/m    OSA?  -Unclear if he ever had a sleep study to confirm  Hx of CVA - with residual deficits   A. Fib -  - CHA2DS2 vas score   4    current  on anticoagulation with Coumadin          -  Rate control:  Currently controlled with Metoprolol  While in ER: Placed on NR VBG showing hypercapnia started on BiPAP with improvement in mental status  Hospitalist was called for admission for hypoxic and hypercapnic respiratory failure secondary to obesity hypoventilation syndrome improved with BiPAP  The following Work up has been ordered so far:  Orders Placed This Encounter  Procedures  . Resp Panel by RT-PCR (Flu A&B, Covid) Nasopharyngeal Swab  . Blood Culture (routine x 2)  . DG Chest Port 1 View  . Lactic acid, plasma  . CBC WITH DIFFERENTIAL  . Comprehensive metabolic panel  . D-dimer, quantitative  . Procalcitonin  . Lactate dehydrogenase  . Ferritin  .  Triglycerides  . Fibrinogen  . C-reactive protein  . Brain natriuretic peptide  . Blood gas, venous  . Protime-INR  . Urinalysis, Routine w reflex microscopic  . Diet NPO time specified  . Cardiac monitoring  . Insert peripheral IV x 2  . Initiate Carrier Fluid Protocol  . Place surgical mask on patient  . Patient to wear surgical mask during transportation  . Assess patient for ability to self-prone. If able (can move self in bed, ambulate) and stable (SpO2 and oxygen requirement):  . RN/NT - Document specific oxygen requirements in  CHL  . Notify EDP if new oxygen requirements escalates > 4L per minute [place]  . RN to draw the following extra tubes:  Marland Kitchen Insert foley catheter  . Consult to hospitalist  ALL PATIENTS BEING ADMITTED/HAVING PROCEDURES NEED COVID-19 SCREENING  . Airborne and Contact precautions  . Pulse oximetry, continuous  . I-stat chem 8, ED (not at [facility] or [facility])  . I-Stat venous blood gas, ED  . ED EKG 12-Lead  . EKG 12-Lead   Following Medications were ordered in ER: Medications  furosemide (LASIX) injection 40 mg (40 mg Intravenous Given 07/30/20 1744)        Consult Orders  (From admission, onward)         Start     Ordered   07/30/20 1830  Consult to hospitalist  ALL PATIENTS BEING ADMITTED/HAVING PROCEDURES NEED COVID-19 SCREENING Pgd 1831  Once       Comments: ALL PATIENTS BEING ADMITTED/HAVING PROCEDURES NEED COVID-19 SCREENING  Provider:  (Not yet assigned)  Question Answer Comment  Place call to: [facility]   Reason for Consult Admit      07/30/20 1829          Significant initial  Findings: Abnormal Labs Reviewed  CBC WITH DIFFERENTIAL/PLATELET - Abnormal; Notable for the following components:      Result Value   Monocytes Absolute 1.1 (*)    Abs Immature Granulocytes 0.15 (*)    All other components within normal limits  COMPREHENSIVE METABOLIC PANEL - Abnormal; Notable for the following components:   Sodium 131 (*)    Chloride 88 (*)    Glucose, Bld 124 (*)    Calcium 8.4 (*)    Total Protein 6.2 (*)    Albumin 3.1 (*)    All other components within normal limits  D-DIMER, QUANTITATIVE - Abnormal; Notable for the following components:   D-Dimer, Quant 0.57 (*)    All other components within normal limits  LACTATE DEHYDROGENASE - Abnormal; Notable for the following components:   LDH 217 (*)    All other components within normal limits  FIBRINOGEN - Abnormal; Notable for the following components:   Fibrinogen 549 (*)    All other components within normal  limits  C-REACTIVE PROTEIN - Abnormal; Notable for the following components:   CRP 6.2 (*)    All other components within normal limits  BRAIN NATRIURETIC PEPTIDE - Abnormal; Notable for the following components:   B Natriuretic Peptide 960.3 (*)    All other components within normal limits  URINALYSIS, ROUTINE W REFLEX MICROSCOPIC - Abnormal; Notable for the following components:   Color, Urine AMBER (*)    APPearance HAZY (*)    Protein, ur 30 (*)    All other components within normal limits  I-STAT CHEM 8, ED - Abnormal; Notable for the following components:   Sodium 129 (*)    Chloride 86 (*)    Glucose, Bld  124 (*)    Calcium, Ion 1.10 (*)    TCO2 35 (*)    All other components within normal limits  I-STAT VENOUS BLOOD GAS, ED - Abnormal; Notable for the following components:   pCO2, Ven 81.2 (*)    pO2, Ven 109.0 (*)    Bicarbonate 39.2 (*)    TCO2 42 (*)    Acid-Base Excess 9.0 (*)    Sodium 129 (*)    Calcium, Ion 1.11 (*)    All other components within normal limits  TROPONIN I (HIGH SENSITIVITY) - Abnormal; Notable for the following components:   Troponin I (High Sensitivity) 76 (*)    All other components within normal limits   Otherwise labs showing:    Recent Labs  Lab 07/30/20 1627 07/30/20 1645  NA 131* 129*  129*  K 4.5 4.2  4.3  CO2 32  --   GLUCOSE 124* 124*  BUN 14 17  CREATININE 0.75 0.80  CALCIUM 8.4*  --     Cr stable,   Lab Results  Component Value Date   CREATININE 0.80 07/30/2020   CREATININE 0.75 07/30/2020   CREATININE 0.52 (L) 02/28/2020    Recent Labs  Lab 07/30/20 1627  AST 25  ALT 29  ALKPHOS 104  BILITOT 0.8  PROT 6.2*  ALBUMIN 3.1*   Lab Results  Component Value Date   CALCIUM 8.4 (L) 07/30/2020   PHOS 3.7 02/21/2020  WBC     Component Value Date/Time   WBC 9.5 07/30/2020 1627   LYMPHSABS 0.7 07/30/2020 1627   LYMPHSABS 1.9 12/27/2012 1339   MONOABS 1.1 (H) 07/30/2020 1627   MONOABS 0.6 12/27/2012 1339    EOSABS 0.0 07/30/2020 1627   EOSABS 0.4 12/27/2012 1339   BASOSABS 0.1 07/30/2020 1627   BASOSABS 0.0 12/27/2012 1339   Plt: Lab Results  Component Value Date   PLT 279 07/30/2020   Lactic Acid, Venous    Component Value Date/Time   LATICACIDVEN 1.0 07/30/2020 1627      COVID-19 Labs  Recent Labs    07/30/20 1627  DDIMER 0.57*  FERRITIN 57  LDH 217*  CRP 6.2*    Lab Results  Component Value Date   SARSCOV2NAA NEGATIVE 07/30/2020   SARSCOV2NAA NEGATIVE 02/27/2020   SARSCOV2NAA NEGATIVE 02/20/2020   Venous  Blood Gas result:  PH 7.292  pCO2 81.2    ABG    Component Value Date/Time   PHART 7.357 02/20/2020 1700   PCO2ART 56.5 (H) 02/20/2020 1700   PO2ART 79.8 (L) 02/20/2020 1700   HCO3 39.2 (H) 07/30/2020 1645   TCO2 35 (H) 07/30/2020 1645   TCO2 42 (H) 07/30/2020 1645   O2SAT 97.0 07/30/2020 1645    HG/HCT stable,      Component Value Date/Time   HGB 16.7 07/30/2020 1645   HGB 16.3 07/30/2020 1645   HGB 13.9 07/15/2019 1055   HGB 14.1 12/27/2012 1339   HCT 49.0 07/30/2020 1645   HCT 48.0 07/30/2020 1645   HCT 40.4 07/15/2019 1055   HCT 41.4 12/27/2012 1339   MCV 96.4 07/30/2020 1627   MCV 89 07/15/2019 1055   MCV 89.2 12/27/2012 1339   Troponin 76-83    ECG: Ordered Personally reviewed by me showing: HR : 83 Rhythm  A.fib   nonspecific changes,   QTC 447   BNP (last 3 results) Recent Labs    02/20/20 1002 07/30/20 1627  BNP 670.0* 960.3*    UA   no evidence of UTI  Urine analysis:    Component Value Date/Time   COLORURINE AMBER (A) 07/30/2020 1821   APPEARANCEUR HAZY (A) 07/30/2020 1821   LABSPEC 1.021 07/30/2020 1821   PHURINE 5.0 07/30/2020 1821   GLUCOSEU NEGATIVE 07/30/2020 1821   HGBUR NEGATIVE 07/30/2020 1821   BILIRUBINUR NEGATIVE 07/30/2020 1821   KETONESUR NEGATIVE 07/30/2020 1821   PROTEINUR 30 (A) 07/30/2020 1821   UROBILINOGEN 1.0 12/28/2010 1809   NITRITE NEGATIVE 07/30/2020 1821   LEUKOCYTESUR NEGATIVE  07/30/2020 1821   Ordered    CXR - CHF    ED Triage Vitals  Enc Vitals Group     BP 07/30/20 1602 123/65     Pulse Rate 07/30/20 1602 72     Resp 07/30/20 1602 (!) 25     Temp 07/30/20 1602 97.8 F (36.6 C)     Temp Source 07/30/20 1602 Oral     SpO2 07/30/20 1602 (!) 52 %     Weight 07/30/20 1610 298 lb 15.1 oz (135.6 kg)     Height 07/30/20 1610  (1.854 m)     Head Circumference --      Peak Flow --      Pain Score 07/30/20 1610 0     Pain Loc --      Pain Edu? --      Excl. in GC? --   TMAX(24)@       Latest  Blood pressure 124/78, pulse 98, temperature 97.8 F (36.6 C), temperature source Oral, resp. rate 10, height  (1.854 m), weight 135.6 kg, SpO2 98 %.     Review of Systems:    Pertinent positives include:   Fatigue confusion  Constitutional:  No weight loss, night sweats, Fevers, chills,, weight loss  HEENT:  No headaches, Difficulty swallowing,Tooth/dental problems,Sore throat,  No sneezing, itching, ear ache, nasal congestion, post nasal drip,  Cardio-vascular:  No chest pain, Orthopnea, PND, anasarca, dizziness, palpitations.no Bilateral lower extremity swelling  GI:  No heartburn, indigestion, abdominal pain, nausea, vomiting, diarrhea, change in bowel habits, loss of appetite, melena, blood in stool, hematemesis Resp:  no shortness of breath at rest. No dyspnea on exertion, No excess mucus, no productive cough, No non-productive cough, No coughing up of blood.No change in color of mucus.No wheezing. Skin:  no rash or lesions. No jaundice GU:  no dysuria, change in color of urine, no urgency or frequency. No straining to urinate.  No flank pain.  Musculoskeletal:  No joint pain or no joint swelling. No decreased range of motion. No back pain.  Psych:  No change in mood or affect. No depression or anxiety. No memory loss.  Neuro: no localizing neurological complaints, no tingling, no weakness, no double vision, no gait abnormality, no slurred  speech, no   All systems reviewed and apart from HOPI all are negative  Past Medical History:   Past Medical History:  Diagnosis Date  . Acute on chronic congestive heart failure (HCC)   . Anemia, secondary    SECONDARY TO ACUTE BLOOD LOSS  . Anxiety disorder   . Bloody stool 08/29/2011   intermittent along with constipation.   . 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 Depression   . 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 Dysphagia   . Hyperlipidemia   . Hypertension   . Lupus anticoagulant disorder (HCC) 1990  . Morbid obesity (HCC)   .  PFO (patent foramen ovale)    TEE 2/10: EF 60%, trivial AI, mild Ao root dilatation, mod PFO with R-L shunting, atrial septal aneurysm;   echo 7/12: EF 65-70%, grade 1 diast dysfxn, mild MR, LVOT showed severe obstruction  . Protein C deficiency (HCC)   . Protein S deficiency (HCC)   . Rectus sheath hematoma 7/12   required reversal of anticoagulation and c/b DVT req. IVC filter  . Seizure disorder [facility])       Past Surgical History:  Procedure Laterality Date  . dental extraction     multiple  . vena cavogram  12/2010   INFERIOR    Social History:  Ambulatory   bed bound     reports that he quit smoking about 12 years ago. His smoking use included cigarettes and cigars. He has a 15.00 pack-year smoking history. He has never used smokeless tobacco. He reports that he does not drink alcohol and does not use drugs.     Family History:   Family History  Problem Relation Age of Onset  . Sleep apnea Sister     Allergies: Allergies  Allergen Reactions  . Fenofibrate Other (See Comments)    Per MAR     Prior to Admission medications   Medication Sig Start Date End Date Taking? Authorizing Provider  acetaminophen (TYLENOL) 325 MG tablet Take 650 mg by mouth every 6 (six) hours as needed for mild pain or headache.      [provider]  Carboxymeth-Glycerin-Polysorb (REFRESH OPTIVE ADVANCED OP) Place 1 drop into both eyes 4 (four) times daily. Wait 3-5 minutes between eye meds    [provider]  enoxaparin (LOVENOX) 150 MG/ML injection Inject 0.94 mLs (140 mg total) into the skin every 12 (twelve) hours for 5 days. 02/28/20 03/04/20  Rodolph Bong, MD  ezetimibe (ZETIA) 10 MG tablet Take 10 mg by mouth daily.    [provider]  fish oil-omega-3 fatty acids 1000 MG capsule Take 1 g by mouth 2 (two) times daily.     [provider]  furosemide (LASIX) 40 MG tablet Take 40 mg by mouth 2 (two) times daily.    [provider]  gabapentin (NEURONTIN) 300 MG capsule Take 300 mg by mouth 3 (three) times daily.      [provider]  guaifenesin (ROBITUSSIN) 100 MG/5ML syrup Take 200 mg by mouth every 4 (four) hours as needed for cough.    [provider]  hydrOXYzine (ATARAX/VISTARIL) 10 MG tablet Take 10 mg by mouth 3 (three) times daily as needed for itching.    [provider]  metoprolol tartrate (LOPRESSOR) 25 MG tablet Take 0.5 tablets (12.5 mg total) by mouth 2 (two) times daily. 12/28/18   Robbie Lis M, PA-C  omeprazole (PRILOSEC) 40 MG capsule Take 1 capsule (40 mg total) by mouth daily. 02/28/20   Rodolph Bong, MD  ondansetron (ZOFRAN) 4 MG tablet Take 4 mg by mouth every 8 (eight) hours as needed for nausea or vomiting.  06/09/19   [provider]  phenytoin (DILANTIN) 100 MG ER capsule Take 200 mg by mouth 2 (two) times daily.    [provider]  RESTASIS 0.05 % ophthalmic emulsion Place 1 drop into both eyes 2 (two) times daily. 06/03/18   [provider]  rosuvastatin (CRESTOR) 40 MG tablet Take 40 mg by mouth at bedtime.     [provider]  Skin Protectants, Misc. (EUCERIN) cream Apply 1 application topically daily  as needed for dry skin.     [provider]  spironolactone  (ALDACTONE) 25 MG tablet Take 1 tablet (25 mg total) by mouth daily. 08/07/18   Alwyn RenMathews, Elizabeth G, MD  traZODone (DESYREL) 50 MG tablet Take 50 mg by mouth at bedtime. 07/08/18   [provider]  triamcinolone cream (KENALOG) 0.1 % Apply 1 application topically daily as needed (rash).    [provider]  venlafaxine (EFFEXOR) 75 MG tablet Take 75 mg by mouth daily. 07/15/18   [provider]  warfarin (COUMADIN) 5 MG tablet Take 1 tablet (5 mg total) by mouth daily. 02/28/20   Rodolph Bonghompson, Daniel V, MD   Physical Exam: Vitals with BMI 07/30/2020 07/30/2020 07/30/2020  Height - - -  Weight - - -  BMI - - -  Systolic 124 121 [id number]  Diastolic 78 91 78  Pulse 98 106 91     1. General:  in No   Acute distress    Chronically ill   -appearing 2. Psychological: somnolent not  Oriented 3. Head/ENT:     Dry Mucous Membranes                          Head Non traumatic, neck supple                          Poor Dentition 4. SKIN: normal  Skin turgor,  Skin clean Dry and intact no rash 5. Heart: Regular rate and rhythm no Murmur, no Rub or gallop 6. Lungs:  Diminished  no wheezes or crackles   7. Abdomen: Soft,  non-tender, Non distended  Obese bowel sounds present 8. Lower extremities: no clubbing, cyanosis,2+ edema 9. Neurologically Grossly intact, moving all 4 extremities equally , not -cooperative 10. MSK: Normal range of motion   All other LABS:     Recent Labs  Lab 07/30/20 1627 07/30/20 1645  WBC 9.5  --   NEUTROABS 7.5  --   HGB 15.1 16.3  16.7  HCT 47.8 48.0  49.0  MCV 96.4  --   PLT 279  --      Recent Labs  Lab 07/30/20 1627 07/30/20 1645  NA 131* 129*  129*  K 4.5 4.2  4.3  CL 88* 86*  CO2 32  --   GLUCOSE 124* 124*  BUN 14 17  CREATININE 0.75 0.80  CALCIUM 8.4*  --      Recent Labs  Lab 07/30/20 1627  AST 25  ALT 29  ALKPHOS 104  BILITOT 0.8  PROT 6.2*  ALBUMIN 3.1*       Cultures:    Component Value Date/Time   SDES   02/20/2020 1002    BLOOD LEFT ANTECUBITAL Performed at [facility], [address] [place], [place] [address]    SDES  02/20/2020 1002    IN/OUT CATH URINE Performed at [facility], [address] [place], [place] [address]    SPECREQUEST  02/20/2020 1002    BOTTLES DRAWN AEROBIC AND ANAEROBIC Blood Culture adequate volume Performed at [facility], [address] [place], [place] [address]    SPECREQUEST  02/20/2020 1002    NONE Performed at [facility], [address] [place], [place] [address]    CULT  02/20/2020 1002    NO GROWTH 5 DAYS Performed at [facility], [address] [place],  [place] [address]    CULT 60,000 COLONIES/mL KLEBSIELLA PNEUMONIAE (A) 02/20/2020 1002   REPTSTATUS 02/25/2020 FINAL 02/20/2020 1002   REPTSTATUS 02/22/2020 FINAL 02/20/2020 1002     Radiological Exams on Admission: DG Chest Port 1 View  Result Date: 07/30/2020 CLINICAL DATA:  Shortness of breath EXAM: PORTABLE CHEST 1 VIEW COMPARISON:  02/20/2020 FINDINGS: Cardiomegaly. Unchanged prominence of the pulmonary vasculature and mild, diffuse interstitial opacity. The visualized skeletal structures are unremarkable. IMPRESSION: Cardiomegaly with unchanged prominence of the pulmonary vasculature and mild, diffuse interstitial opacity, most consistent with edema. No focal airspace opacity. Electronically Signed   By: Lauralyn Primes M.D.   On: 07/30/2020 16:35    Chart has been reviewed    Assessment/Plan  65 y.o. male with medical history significant of CVA with right side weakness, a.fib on coumadin, HTN, obesity, bradycardia chronic, hypercapnic respiratory failure chronic, seizure disorder, chronic diastolic CHF, history of stroke, HLD, Admitted for acute on chronic respiratory failure with hypoxia and hypercapnia and CHF exacerbation  Present on Admission: . Acute on chronic respiratory failure with  hypercapnia (HCC) -  this patient has acute respiratory failure with Hypoxia and Hypercarbia as documented by the presence of following: O2 saturatio< 90% on RA  pH <7.35 with pCO2 >50   Likely due to:  CHF exacerbation,  Provide O2 therapy and titrate as needed  Continuous pulse ox  start on biPAP Patient likely needs BiPAP nightly had difficulty obtaining outpatient sleep study.  Would benefit from pulmonology assistance   . Acute on chronic diastolic CHF (congestive heart failure) (HCC) -  - admit on telemetry,  cycle cardiac enzymes, Troponin  76-83 obtain serial ECG  to evaluate for ischemia as a cause of heart failure  monitor daily weight:  Filed Weights   07/30/20 1610  Weight: 135.6 kg   Last BNP BNP (last 3 results) Recent Labs    02/20/20 1002 07/30/20 1627  BNP 670.0* 960.3*    diurese with IV lasix and monitor orthostatics and creatinine to avoid over diuresis.  Order echogram to evaluate EF and valves  ACE/ARBi   Contraindicated SOFt BP   cardiology emailed  . Pure hypercholesterolemia -resume home medications when able to told  Chronic paroxysmal atrial fibrillation -continue Coumadin given somewhat soft blood pressure will hold off on the Toprol for tonight resume when able.  . Protein S deficiency (HCC) . Protein C deficiency (HCC)on Coumadin  . Obesity, Class III, BMI 40-49.9 (morbid obesity) (HCC) chronic would benefit from nutritional consult Given diminished albumin check prealbumin and assess nutritional status   . Acute metabolic encephalopathy -secondary to hypercarbic respiratory failure similar to prior admission improved with BiPAP will continue Patient likely need BiPAP nightly secondary to obesity hypoventilation syndrome with hypercapnia  . Elevated troponin -  -no chest pain  in the setting of  increased work of breathing and  likely due to demand ischemia  , monitor on telemetry and cycle cardiac enzymes to trend. Obtain echogram patient  already anticoagulated with Coumadin Appreciate cardiology input  history of seizure disorder continue Dilantin check Dilantin level No recent seizures were reported last loss years ago Other plan as per orders.  DVT prophylaxis:    Coumadin thank you   Code Status:    Code Status: Prior   DNR/DNI  as per  family  I had personally discussed CODE STATUS with  Family      Family Communication:   Family not at  Bedside  plan of care was discussed on  the phone with  Sister,     Disposition Plan:                             Back to current facility when stable                            Following barriers for discharge:                                                         Will need to be able to tolerate PO                        Cardiac work-up completed                           Will need consultants to evaluate patient prior to discharge                  Would benefit from PT/OT eval prior to DC  Ordered                                                       Transition of care consulted                   Nutrition    consulted                                      Palliative care    consulted                               Consults called: Emailed cardiology Admission status:  ED Disposition    ED Disposition Condition Comment   Admit  Hospital Area: [facility] [[id number]]  Level of Care: Progressive [102]  Admit to Progressive based on following criteria: RESPIRATORY PROBLEMS hypoxemic/hypercapnic respiratory failure that is responsive to NIPPV (BiPAP) or High Flow Nasal Cannula (6-80 lpm). Frequent assessment/intervention, no > Q2 hrs < Q4 hrs, to maintain oxygenation and pulmonary hygiene.  May admit patient to [facility] or [facility] if equivalent level of care is available:: No  Covid Evaluation: Confirmed COVID Negative  Diagnosis: CHF exacerbation [facility]) [[id number]]  Admitting Physician: Therisa Doyne [[id number]]  Attending Physician: Therisa Doyne [[id number]]  Estimated length of stay: past midnight tomorrow  Certification:: I certify this patient will need inpatient services for at least 2 midnights        inpatient     I Expect 2 midnight stay secondary to severity of patient's current illness need for inpatient interventions justified by the following:  hemodynamic instability despite optimal treatment ( hypoxia, hypercapnia)   Severe lab/radiological/exam abnormalities including:   CHF on chest x-ray and extensive comorbidities including:  CHF  Morbid Obesity  history of stroke with residual deficits .   Marland Kitchen Chronic anticoagulation  That are  currently affecting medical management.   I expect  patient to be hospitalized for 2 midnights requiring inpatient medical care.  Patient is at high risk for adverse outcome (such as loss of life or disability) if not treated.  Indication for inpatient stay as follows:  Severe change from baseline regarding mental status Hemodynamic instability despite maximal medical therapy,    Need for BiPAP inability to maintain oral hydration    New or worsening hypoxia  Need for IV diuretics and BiPAP   Level of care      Progressive tele indefinitely please discontinue once patient no longer qualifies COVID-19 Labs    Lab Results  Component Value Date   SARSCOV2NAA NEGATIVE 07/30/2020     Precautions: admitted as  Covid Negative     PPE: Used by the provider:   P100  eye Goggles,  Gloves   Einer Meals 07/30/2020, 11:05 PM    [facility]     after 2 AM please page floor coverage PA If 7AM-7PM, please contact the day team taking care of the patient using [email or web address]   Patient was evaluated in the context of the global COVID-19 pandemic, which necessitated consideration that the patient might be at risk for infection with the SARS-CoV-2 virus that causes COVID-19. Institutional protocols and algorithms that pertain to the evaluation of patients at risk  for COVID-19 are in a state of rapid change based on information released by regulatory bodies including the CDC and federal and state organizations. These policies and algorithms were followed during the patient's care.

## 2020-07-31 ENCOUNTER — Inpatient Hospital Stay (HOSPITAL_COMMUNITY): Payer: Medicare Other

## 2020-07-31 DIAGNOSIS — E7800 Pure hypercholesterolemia, unspecified: Secondary | ICD-10-CM

## 2020-07-31 DIAGNOSIS — Z66 Do not resuscitate: Secondary | ICD-10-CM

## 2020-07-31 DIAGNOSIS — R778 Other specified abnormalities of plasma proteins: Secondary | ICD-10-CM

## 2020-07-31 DIAGNOSIS — Z7189 Other specified counseling: Secondary | ICD-10-CM

## 2020-07-31 DIAGNOSIS — R0902 Hypoxemia: Secondary | ICD-10-CM

## 2020-07-31 DIAGNOSIS — I5033 Acute on chronic diastolic (congestive) heart failure: Secondary | ICD-10-CM

## 2020-07-31 DIAGNOSIS — Z515 Encounter for palliative care: Secondary | ICD-10-CM

## 2020-07-31 DIAGNOSIS — D6859 Other primary thrombophilia: Secondary | ICD-10-CM

## 2020-07-31 LAB — COMPREHENSIVE METABOLIC PANEL
ALT: 24 U/L (ref 0–44)
AST: 19 U/L (ref 15–41)
Albumin: 2.6 g/dL — ABNORMAL LOW (ref 3.5–5.0)
Alkaline Phosphatase: 91 U/L (ref 38–126)
Anion gap: 9 (ref 5–15)
BUN: 13 mg/dL (ref 8–23)
CO2: 34 mmol/L — ABNORMAL HIGH (ref 22–32)
Calcium: 8.5 mg/dL — ABNORMAL LOW (ref 8.9–10.3)
Chloride: 89 mmol/L — ABNORMAL LOW (ref 98–111)
Creatinine, Ser: 0.7 mg/dL (ref 0.61–1.24)
GFR, Estimated: >60 mL/min (ref >60)
Glucose, Bld: 93 mg/dL (ref 70–99)
Potassium: 3.9 mmol/L (ref 3.5–5.1)
Sodium: 132 mmol/L — ABNORMAL LOW (ref 135–145)
Total Bilirubin: 1 mg/dL (ref 0.3–1.2)
Total Protein: 5.3 g/dL — ABNORMAL LOW (ref 6.5–8.1)

## 2020-07-31 LAB — CBC WITH DIFFERENTIAL/PLATELET
Abs Immature Granulocytes: 0.08 10*3/uL — ABNORMAL HIGH (ref 0.00–0.07)
Basophils Absolute: 0 10*3/uL (ref 0.0–0.1)
Basophils Relative: 0 %
Eosinophils Absolute: 0.3 10*3/uL (ref 0.0–0.5)
Eosinophils Relative: 4 %
HCT: 45 % (ref 39.0–52.0)
Hemoglobin: 14.1 g/dL (ref 13.0–17.0)
Immature Granulocytes: 1 %
Lymphocytes Relative: 10 %
Lymphs Abs: 0.9 10*3/uL (ref 0.7–4.0)
MCH: 30.3 pg (ref 26.0–34.0)
MCHC: 31.3 g/dL (ref 30.0–36.0)
MCV: 96.8 fL (ref 80.0–100.0)
Monocytes Absolute: 0.9 10*3/uL (ref 0.1–1.0)
Monocytes Relative: 10 %
Neutro Abs: 6.1 10*3/uL (ref 1.7–7.7)
Neutrophils Relative %: 75 %
Platelets: 207 10*3/uL (ref 150–400)
RBC: 4.65 MIL/uL (ref 4.22–5.81)
RDW: 14.3 % (ref 11.5–15.5)
WBC: 8.2 10*3/uL (ref 4.0–10.5)
nRBC: 0.2 % (ref 0.0–0.2)

## 2020-07-31 LAB — PHOSPHORUS: Phosphorus: 3.7 mg/dL (ref 2.5–4.6)

## 2020-07-31 LAB — ECHOCARDIOGRAM COMPLETE
Area-P 1/2: 4.12 cm2
Calc EF: 70.5 %
Height: 73 in
S' Lateral: 2.9 cm
Single Plane A2C EF: 78 %
Single Plane A4C EF: 67.4 %
Weight: 4783.1 oz

## 2020-07-31 LAB — OSMOLALITY, URINE: Osmolality, Ur: 413 mOsm/kg (ref 300–900)

## 2020-07-31 LAB — CREATININE, URINE, RANDOM: Creatinine, Urine: 198.58 mg/dL

## 2020-07-31 LAB — MAGNESIUM: Magnesium: 1.8 mg/dL (ref 1.7–2.4)

## 2020-07-31 LAB — TROPONIN I (HIGH SENSITIVITY): Troponin I (High Sensitivity): 94 ng/L — ABNORMAL HIGH (ref <18)

## 2020-07-31 LAB — TSH: TSH: 0.459 u[IU]/mL (ref 0.350–4.500)

## 2020-07-31 LAB — PROTIME-INR
INR: 1.7 — ABNORMAL HIGH (ref 0.8–1.2)
Prothrombin Time: 19.3 seconds — ABNORMAL HIGH (ref 11.4–15.2)

## 2020-07-31 LAB — GLUCOSE, CAPILLARY: Glucose-Capillary: 106 mg/dL — ABNORMAL HIGH (ref 70–99)

## 2020-07-31 LAB — HIV ANTIBODY (ROUTINE TESTING W REFLEX): HIV Screen 4th Generation wRfx: NONREACTIVE

## 2020-07-31 LAB — OSMOLALITY: Osmolality: 281 mOsm/kg (ref 275–295)

## 2020-07-31 LAB — SODIUM, URINE, RANDOM: Sodium, Ur: <10 mmol/L

## 2020-07-31 LAB — PHENYTOIN LEVEL, TOTAL: Phenytoin Lvl: 8.6 ug/mL — ABNORMAL LOW (ref 10.0–20.0)

## 2020-07-31 LAB — PREALBUMIN: Prealbumin: 13.3 mg/dL — ABNORMAL LOW (ref 18–38)

## 2020-07-31 MED ORDER — PERFLUTREN LIPID MICROSPHERE
1.0000 mL | INTRAVENOUS | Status: AC | PRN
  Administered 2020-07-31: 2 mL via INTRAVENOUS
  Filled 2020-07-31: qty 10

## 2020-07-31 MED ORDER — METOPROLOL TARTRATE 12.5 MG HALF TABLET
12.5000 mg | ORAL_TABLET | Freq: Two times a day (BID) | ORAL | Status: DC
  Administered 2020-07-31 – 2020-08-07 (×14): 12.5 mg via ORAL
  Filled 2020-07-31 (×14): qty 1

## 2020-07-31 MED ORDER — WARFARIN SODIUM 5 MG PO TABS
5.0000 mg | ORAL_TABLET | Freq: Once | ORAL | Status: AC
  Administered 2020-07-31: 5 mg via ORAL
  Filled 2020-07-31 (×2): qty 1

## 2020-07-31 MED ORDER — CHLORHEXIDINE GLUCONATE CLOTH 2 % EX PADS
6.0000 | MEDICATED_PAD | Freq: Every day | CUTANEOUS | Status: DC
  Administered 2020-07-31 – 2020-08-02 (×2): 6 via TOPICAL

## 2020-07-31 NOTE — Progress Notes (Signed)
Patient ID: James ForthDavid B Villegas, male   DOB: [birth year], 65 y.o.   MRN: [id number]  PROGRESS NOTE    James Villegas  OZH:[id number] DOB: [birth year] DOA: 07/30/2020 PCP: James DupesBlake, Khashana A, MD   Brief Narrative:  65 year old male with history of unspecified CVA with right-sided weakness, Villegas. fib on Coumadin, hypertension, obesity, bradycardia, chronic hypercapnic respiratory failure, seizure disorder, chronic diastolic CHF, hyperlipidemia presented with altered mental status from his nursing facility.  Patient was poorly responsive on presentation and was hypoxic down to 50% on room air requiring nonrebreather.  ABG showed hypercapnia and patient was started on BiPAP with some improvement in mental status.  He was given intravenous Lasix.  Assessment & Plan:   Acute on chronic respiratory failure with hypoxia and hypercapnia Acute on chronic diastolic CHF Acute metabolic encephalopathy Mild elevated troponins -Presented with altered mental status and hypoxia/hypercapnia requiring BiPAP and intravenous Lasix.  Chest x-ray on presentation showed pulmonary edema.  COVID-19 and flu testing were negative -Mental status has much improved.  Patient has been transitioned to nasal cannula oxygen this morning. -Troponins did not trend up continue intravenous Lasix.  Strict input and output.  Daily weights.  Fluid restriction. -Cardiology evaluation -will request SLP evaluation -Patient has had difficulty obtaining outpatient sleep study.  Chronic Villegas. Fib/protein S and protein C deficiency -Continue Coumadin.  Monitor INR -Toprol on hold for soft blood pressure -Currently rate controlled  Morbid obesity -Outpatient follow-up  Hyperlipidemia -Continue rosuvastatin  History of seizure disorder -Continue phenytoin.  No recent seizures reported  Generalized deconditioning -Overall prognosis is guarded to poor.  Palliative care consultation.  CODE STATUS was documented as DNR on admission. -As per  nursing staff, patient has changed his mind and wants to be full code.    DVT prophylaxis: Coumadin Code Status: Full Family Communication: None Disposition Plan: Status is: Inpatient  Remains inpatient appropriate because:Inpatient level of care appropriate due to severity of illness   Dispo: The patient is from: SNF              Anticipated d/c is to: SNF              Anticipated d/c date is: 2 days              Patient currently is not medically stable to d/c.   Difficult to place patient No  Consultants: Cardiology/palliative care  Procedures: None  Antimicrobials: None   Subjective: Patient seen and examined at bedside.  Poor historian but answers some questions appropriately.  Currently off BiPAP.  No overnight fever or vomiting reported.  Denies current chest pain.  Objective: Vitals:   07/31/20 0515 07/31/20 0800 07/31/20 0900 07/31/20 1000  BP:  114/76 126/76 112/88  Pulse: 65 62 85 78  Resp: 12 10  (!) 21  Temp:   97.7 F (36.5 C)   TempSrc:   Oral   SpO2: 95% 97% 91% 94%  Weight:      Height:       No intake or output data in the 24 hours ending 07/31/20 1044 Filed Weights   07/30/20 1610  Weight: 135.6 kg    Examination:  General exam: Looks chronically ill.  Currently off BiPAP and on nasal cannula oxygen respiratory system: Bilateral decreased breath sounds at bases with scattered crackles and intermittent tachypnea Cardiovascular system: S1 & S2 heard, Rate controlled Gastrointestinal system: Abdomen is nondistended, soft and nontender. Normal bowel sounds heard. Extremities: No cyanosis, clubbing; bilateral lower extremity pitting edema  2-3+ present Central nervous system: Awake, answers some questions, slow to respond.  Poor historian.  No focal neurological deficits. Moving extremities Skin: No rashes, lesions or ulcers Psychiatry: Extremely flat affect    Data Reviewed: I have personally reviewed following labs and imaging  studies  CBC: Recent Labs  Lab 07/30/20 1627 07/30/20 1645 07/31/20 0500  WBC 9.5  --  8.2  NEUTROABS 7.5  --  6.1  HGB 15.1 16.3  16.7 14.1  HCT 47.8 48.0  49.0 45.0  MCV 96.4  --  96.8  PLT 279  --  207   Basic Metabolic Panel: Recent Labs  Lab 07/30/20 1627 07/30/20 1645 07/31/20 0500  NA 131* 129*  129* 132*  K 4.5 4.2  4.3 3.9  CL 88* 86* 89*  CO2 32  --  34*  GLUCOSE 124* 124* 93  BUN 14 17 13   CREATININE 0.75 0.80 0.70  CALCIUM 8.4*  --  8.5*  MG  --   --  1.8  PHOS  --   --  3.7   GFR: Estimated Creatinine Clearance: 138.4 mL/min (by C-G formula based on SCr of 0.7 mg/dL). Liver Function Tests: Recent Labs  Lab 07/30/20 1627 07/31/20 0500  AST 25 19  ALT 29 24  ALKPHOS 104 91  BILITOT 0.8 1.0  PROT 6.2* 5.3*  ALBUMIN 3.1* 2.6*   No results for input(s): LIPASE, AMYLASE in the last 168 hours. No results for input(s): AMMONIA in the last 168 hours. Coagulation Profile: Recent Labs  Lab 07/30/20 1627 07/31/20 0500  INR 1.7* 1.7*   Cardiac Enzymes: No results for input(s): CKTOTAL, CKMB, CKMBINDEX, TROPONINI in the last 168 hours. BNP (last 3 results) No results for input(s): PROBNP in the last 8760 hours. HbA1C: No results for input(s): HGBA1C in the last 72 hours. CBG: No results for input(s): GLUCAP in the last 168 hours. Lipid Profile: Recent Labs    07/30/20 1627  TRIG 91   Thyroid Function Tests: Recent Labs    07/31/20 0500  TSH 0.459   Anemia Panel: Recent Labs    07/30/20 1627  FERRITIN 57   Sepsis Labs: Recent Labs  Lab 07/30/20 1627  PROCALCITON <0.10  LATICACIDVEN 1.0    Recent Results (from the past 240 hour(s))  Resp Panel by RT-PCR (Flu Villegas&B, Covid) Nasopharyngeal Swab     Status: None   Collection Time: 07/30/20  4:27 PM   Specimen: Nasopharyngeal Swab; Nasopharyngeal(NP) swabs in vial transport medium  Result Value Ref Range Status   SARS Coronavirus 2 by RT PCR NEGATIVE NEGATIVE Final    Comment:  (NOTE) SARS-CoV-2 target nucleic acids are NOT DETECTED.  The SARS-CoV-2 RNA is generally detectable in upper respiratory specimens during the acute phase of infection. The lowest concentration of SARS-CoV-2 viral copies this assay can detect is 138 copies/mL. Villegas negative result does not preclude SARS-Cov-2 infection and should not be used as the sole basis for treatment or other patient management decisions. Villegas negative result may occur with  improper specimen collection/handling, submission of specimen other than nasopharyngeal swab, presence of viral mutation(s) within the areas targeted by this assay, and inadequate number of viral copies(<138 copies/mL). Villegas negative result must be combined with clinical observations, patient history, and epidemiological information. The expected result is Negative.  Fact Sheet for Patients:  08/01/20  Fact Sheet for Healthcare Providers:  BloggerCourse.com  This test is no t yet approved or cleared by the [employer] and  has been authorized  for detection and/or diagnosis of SARS-CoV-2 by FDA under an Emergency Use Authorization (EUA). This EUA will remain  in effect (meaning this test can be used) for the duration of the COVID-19 declaration under Section 564(b)(1) of the Act, 21 U.S.C.section 360bbb-3(b)(1), unless the authorization is terminated  or revoked sooner.       Influenza Villegas by PCR NEGATIVE NEGATIVE Final   Influenza B by PCR NEGATIVE NEGATIVE Final    Comment: (NOTE) The Xpert Xpress SARS-CoV-2/FLU/RSV plus assay is intended as an aid in the diagnosis of influenza from Nasopharyngeal swab specimens and should not be used as Villegas sole basis for treatment. Nasal washings and aspirates are unacceptable for Xpert Xpress SARS-CoV-2/FLU/RSV testing.  Fact Sheet for Patients: BloggerCourse.com  Fact Sheet for Healthcare  Providers: SeriousBroker.it  This test is not yet approved or cleared by the [place] FDA and has been authorized for detection and/or diagnosis of SARS-CoV-2 by FDA under an Emergency Use Authorization (EUA). This EUA will remain in effect (meaning this test can be used) for the duration of the COVID-19 declaration under Section 564(b)(1) of the Act, 21 U.S.C. section 360bbb-3(b)(1), unless the authorization is terminated or revoked.  Performed at [facility], [address] [place], [place] [address]   Blood Culture (routine x 2)     Status: None (Preliminary result)   Collection Time: 07/30/20  4:27 PM   Specimen: BLOOD  Result Value Ref Range Status   Specimen Description BLOOD LEFT ANTECUBITAL  Final   Special Requests   Final    BOTTLES DRAWN AEROBIC AND ANAEROBIC Blood Culture adequate volume   Culture   Final    NO GROWTH < 12 HOURS Performed at [facility], [address] [place], [place] [address]    Report Status PENDING  Incomplete  Blood Culture (routine x 2)     Status: None (Preliminary result)   Collection Time: 07/30/20  4:27 PM   Specimen: BLOOD LEFT HAND  Result Value Ref Range Status   Specimen Description BLOOD LEFT HAND  Final   Special Requests   Final    BOTTLES DRAWN AEROBIC AND ANAEROBIC Blood Culture results may not be optimal due to an inadequate volume of blood received in culture bottles   Culture   Final    NO GROWTH < 12 HOURS Performed at [facility], [address], [place], [place] [address]    Report Status PENDING  Incomplete         Radiology Studies: DG Chest Port 1 View  Result Date: 07/30/2020 CLINICAL DATA:  Shortness of breath EXAM: PORTABLE CHEST 1 VIEW COMPARISON:  02/20/2020 FINDINGS: Cardiomegaly. Unchanged prominence of the pulmonary vasculature and mild, diffuse interstitial opacity. The visualized skeletal structures are unremarkable. IMPRESSION: Cardiomegaly with  unchanged prominence of the pulmonary vasculature and mild, diffuse interstitial opacity, most consistent with edema. No focal airspace opacity. Electronically Signed   By: Lauralyn Primes M.D.   On: 07/30/2020 16:35        Scheduled Meds: . cycloSPORINE  1 drop Both Eyes BID  . ezetimibe  10 mg Oral Daily  . furosemide  40 mg Intravenous Q12H  . pantoprazole  40 mg Oral Daily  . phenytoin  200 mg Oral BID WC  . rosuvastatin  40 mg Oral QHS  . senna  1 tablet Oral BID  . sodium chloride flush  3 mL Intravenous Q12H  . warfarin  5 mg Oral ONCE-1600  . Warfarin -  Pharmacist Dosing Inpatient   Does not apply q1600   Continuous Infusions: . sodium chloride            Glade Lloyd, MD [facility] 07/31/2020, 10:44 AM

## 2020-07-31 NOTE — Plan of Care (Signed)
  Problem: Education: Goal: Knowledge of General Education information will improve Description: Including pain rating scale, medication(s)/side effects and non-pharmacologic comfort measures Outcome: Progressing   Problem: Nutrition: Goal: Adequate nutrition will be maintained Outcome: Progressing   Problem: Coping: Goal: Level of anxiety will decrease Outcome: Progressing   Problem: Cardiac: Goal: Ability to achieve and maintain adequate cardiopulmonary perfusion will improve Outcome: Progressing

## 2020-07-31 NOTE — Consult Note (Signed)
Cardiology Consultation:   Patient ID: ERMA JOUBERT MRN: [id number]; DOB: 06/24/1955  Admit date: 07/30/2020 Date of Consult: 07/31/2020  PCP:  Karna Dupes, MD   [facility]  Cardiologist:  Dr. Delton See   Patient Profile:   ZAKEE DEERMAN is a 65 y.o. 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 Adela Glimpse.   Dr. Excell Seltzer evaluated him in 2013 for his PFO. No indication for closure. Medication w/ anticoagulation only was recommended.   He was lost to f/u for a period of time and was admitted to [facility] for CHF and new onset atrial fibrillation/flutter in 2020. He was seen by Dr. Delton See.  Echocardiogram showed normal LV systolic function, mildly dilated LA. He was treated w/ IV diuretics and diuresed well.  Plan was for rate control strategy.  Was on Coumadin for anticoagulation.  Previously scheduled for sleep study but unable to complete at facility as he is required [facility] lift.  Referral made to [facility] for sleep study.  He is followed by Dr. Craige Cotta.   Last echocardiogram March 2021 with LV function of 60 to 65%, no regional wall motion abnormality, cannot determine diastolic function, normal RV function, dilated ascending aorta at 45 mm.  History of Present Illness:   Mr. Bledsoe brought to [facility] emergency room from [facility] nursing home for altered mental status and hypoxia.  His outpatient Lasix recently increased to 40 mg daily due to lower extremity edema and shortness of breath.  He was noted more confused than his  baseline with weakness and EMS was called.  He was found hypoxic to 50% in room air.  He was given Lasix and DuoNeb with improvement.  Initially required nonrebreather then transition to nasal cannula.  Patient is admitted for acute on chronic hypercapnic hypoxic respiratory failure.  BNP 960 LDH 217 High-sensitivity troponin 76>> 83>> 94 CRP 6.2 Ferritin normal D-dimer 0.57 Fibrinogen 549 INR 1.7 Phenytoin level 8.6 TSH normal Chest x-ray mild diffuse interstitial edema   Past Medical History:  Diagnosis Date  . Acute on chronic congestive heart failure (HCC)   . Anemia, secondary    SECONDARY TO ACUTE BLOOD LOSS  . Anxiety disorder   . Bloody stool 08/29/2011   intermittent along with constipation.   . 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 Depression   . 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 Dysphagia   . Hyperlipidemia   . Hypertension   . Lupus anticoagulant disorder (HCC) 1990  . Morbid obesity (HCC)   . PFO (patent foramen ovale)    TEE 2/10: EF 60%, trivial AI, mild Ao root dilatation, mod PFO with R-L shunting, atrial septal aneurysm;   echo 7/12: EF 65-70%, grade 1 diast dysfxn, mild MR, LVOT showed severe obstruction  . Protein C deficiency (HCC)   . Protein S deficiency (HCC)   . Rectus sheath hematoma 7/12   required reversal of anticoagulation and c/b DVT req. IVC filter  . Seizure disorder (HCC)  Past Surgical History:  Procedure Laterality Date  . dental extraction     multiple  . vena cavogram  12/2010   INFERIOR    Inpatient Medications: Scheduled Meds: . cycloSPORINE  1 drop Both Eyes BID  . ezetimibe  10 mg Oral Daily  . furosemide  40 mg Intravenous Q12H  . pantoprazole  40 mg Oral Daily  . phenytoin  200 mg Oral BID WC  . rosuvastatin  40 mg Oral QHS  . senna  1 tablet Oral BID  . sodium chloride flush  3 mL Intravenous Q12H   . warfarin  5 mg Oral ONCE-1600  . Warfarin - Pharmacist Dosing Inpatient   Does not apply q1600   Continuous Infusions: . sodium chloride     PRN Meds: sodium chloride, acetaminophen **OR** acetaminophen, bisacodyl, polyethylene glycol, sodium chloride flush  Allergies:    Allergies  Allergen Reactions  . Fenofibrate Other (See Comments)    Per MAR    Social History:   Social History   Socioeconomic History  . Marital status: Single    Spouse name: Not on file  . Number of children: Not on file  . Years of education: Not on file  . Highest education level: Not on file  Occupational History  . Not on file  Tobacco Use  . Smoking status: Former Smoker    Packs/day: 1.00    Years: 15.00    Pack years: 15.00    Types: Cigarettes, Cigars    Quit date: 2010    Years since quitting: 12.1  . Smokeless tobacco: Never Used  Vaping Use  . Vaping Use: Never used  Substance and Sexual Activity  . 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 Drug use: No  . Sexual activity: Not on file  Other Topics Concern  . Not on file  Social History Narrative  . Not on file   Social Determinants of Health   Financial Resource Strain: Not on file  Food Insecurity: Not on file  Transportation Needs: Not on file  Physical Activity: Not on file  Stress: Not on file  Social Connections: Not on file  Intimate Partner Violence: Not on file    Family History:  Family History  Problem Relation Age of Onset  . Sleep apnea Sister      ROS:  Please see the history of present illness.  All other ROS reviewed and negative.     Physical Exam/Data:   Vitals:   07/31/20 0515 07/31/20 0800 07/31/20 0900 07/31/20 1000  BP:  114/76 126/76 112/88  Pulse: 65 62 85 78  Resp: 12 10  (!) 21  Temp:   97.7 F (36.5 C)   TempSrc:   Oral   SpO2: 95% 97% 91% 94%  Weight:      Height:       No intake or output data in the 24 hours ending 07/31/20 1116 Last 3 Weights 07/30/2020 02/27/2020  02/26/2020  Weight (lbs) 298 lb 15.1 oz 299 lb 9.6 oz 300 lb 4.8 oz  Weight (kg) 135.6 kg 135.898 kg 136.215 kg     Body mass index is 39.44 kg/m.  General:  Well nourished, well developed, in no acute distress HEENT: normal Lymph: no adenopathy Neck: JVD difficult to evaluate due to body habitus Endocrine:  No thryomegaly Vascular: No carotid bruits; FA pulses 2+ bilaterally without bruits  Cardiac:  normal S1, S2; irregularly irregular; no murmur Lungs: Clear on anterior auscultation  Abd: soft, nontender, no hepatomegaly  Ext: Bilateral lower extremity edema right greater than left, right upper extremity edema Musculoskeletal:  No deformities Skin: warm and dry  Neuro: Right-sided residual weakness/paralysis from stroke  psych:  Normal affect   EKG:  The EKG was personally reviewed and demonstrates: Atrial fibrillation, ST depression inferior lateral lead similar to prior, questionable more pronounced Telemetry:  Telemetry was personally reviewed and demonstrates:  Atrial fibrillation   Relevant CV Studies:  Echo 08/2019 1. Left ventricular ejection fraction, by estimation, is 60 to 65%. The  left ventricle has normal function. The left ventricle has no regional  wall motion abnormalities. Left ventricular diastolic function could not  be evaluated. Left ventricular  diastolic function could not be evaluated.  2. Right ventricular systolic function is normal. The right ventricular  size is normal. There is normal pulmonary artery systolic pressure.  3. The mitral valve is grossly normal. Trivial mitral valve  regurgitation. No evidence of mitral stenosis.  4. The aortic valve is tricuspid. Aortic valve regurgitation is not  visualized. No aortic stenosis is present.  5. Aortic dilatation noted. Aneurysm of the aortic root, measuring 45 mm.  6. The inferior vena cava is normal in size with greater than 50%  respiratory variability, suggesting right atrial pressure of 3  mmHg.   Laboratory Data:  High Sensitivity Troponin:   Recent Labs  Lab 07/30/20 1627 07/30/20 1821 07/31/20 0100  TROPONINIHS 76* 83* 94*     Chemistry Recent Labs  Lab 07/30/20 1627 07/30/20 1645 07/31/20 0500  NA 131* 129*  129* 132*  K 4.5 4.2  4.3 3.9  CL 88* 86* 89*  CO2 32  --  34*  GLUCOSE 124* 124* 93  BUN 14 17 13   CREATININE 0.75 0.80 0.70  CALCIUM 8.4*  --  8.5*  GFRNONAA >60  --  >60  ANIONGAP 11  --  9    Recent Labs  Lab 07/30/20 1627 07/31/20 0500  PROT 6.2* 5.3*  ALBUMIN 3.1* 2.6*  AST 25 19  ALT 29 24  ALKPHOS 104 91  BILITOT 0.8 1.0   Hematology Recent Labs  Lab 07/30/20 1627 07/30/20 1645 07/31/20 0500  WBC 9.5  --  8.2  RBC 4.96  --  4.65  HGB 15.1 16.3  16.7 14.1  HCT 47.8 48.0  49.0 45.0  MCV 96.4  --  96.8  MCH 30.4  --  30.3  MCHC 31.6  --  31.3  RDW 14.6  --  14.3  PLT 279  --  207   BNP Recent Labs  Lab 07/30/20 1627  BNP 960.3*    DDimer  Recent Labs  Lab 07/30/20 1627  DDIMER 0.57*     Radiology/Studies:  DG Chest Port 1 View  Result Date: 07/30/2020 CLINICAL DATA:  Shortness of breath EXAM: PORTABLE CHEST 1 VIEW COMPARISON:  02/20/2020 FINDINGS: Cardiomegaly. Unchanged prominence of the pulmonary vasculature and mild, diffuse interstitial opacity. The visualized skeletal structures are unremarkable. IMPRESSION: Cardiomegaly with unchanged prominence of the pulmonary vasculature and mild, diffuse interstitial opacity, most consistent with edema. No focal airspace opacity. Electronically Signed   By: 02/22/2020 M.D.   On: 07/30/2020 16:35    Assessment and Plan:   1. Acute on chronic hypercapnic hypoxic respiratory failure 2. Acute on chronic diastolic heart failure  -Patient with pending sleep study evaluation with suspected obesity hypoventilation syndrome.  Recently increase Lasix in outpatient setting.  Covid negative.  Initially required nonrebreather now on nasal  cannula. -Continue IV  Lasix -Hopefully we can arrange BiPAP prior to discharge this admission -Pending echocardiogram -Dimer minimally up. ? Chronic PE but on anticoagulation. INR 1.7 today - COVID negative   3.  Elevated troponin - High-sensitivity troponin 76>> 83>> 94 -No chest pain -EKG with chronic T wave changes -Likely is demand in setting of above  4.  Permanent atrial fibrillation -On Coumadin for anticoagulation (also for hypercoagulable state) -Bleeding issue -INR 1.7 -Toprol held due to soft blood pressure  5.  Ascending aortic aneurysm -4.3 cm by CT angio of chest 09/2019  Otherwise per primary team   Risk Assessment/Risk Scores:   New York Heart Association (NYHA) Functional Class NYHA Class III  CHA2DS2-VASc Score = 4  This indicates a 4.8% annual risk of stroke. The patient's score is based upon: CHF History: Yes HTN History: Yes Diabetes History: No Stroke History: Yes Vascular Disease History: No Age Score: 0 Gender Score: 0    Dr. Duke Salviaandolph to see later today.   For questions or updates, please contact [facility] Please consult [email or web address] for contact info under    Lorelei PontSigned, Bhavinkumar Bhagat, PA  07/31/2020 11:16 AM

## 2020-07-31 NOTE — Progress Notes (Signed)
ANTICOAGULATION CONSULT NOTE   Pharmacy Consult for Warfarin Indication: atrial fibrillation  Allergies  Allergen Reactions  . Fenofibrate Other (See Comments)    Per [facility]    Patient Measurements: Height: 6\' 1"  (185.4 cm) Weight: 135.6 kg (298 lb 15.1 oz) IBW/kg (Calculated) : 79.9  Vital Signs: BP: 112/79 (02/22 0500) Pulse Rate: 65 (02/22 0515)  Labs: Recent Labs    07/30/20 1627 07/30/20 1645 07/30/20 1821 07/31/20 0100 07/31/20 0500  HGB 15.1 16.3  16.7  --   --  14.1  HCT 47.8 48.0  49.0  --   --  45.0  PLT 279  --   --   --  207  LABPROT 19.0*  --   --   --  19.3*  INR 1.7*  --   --   --  1.7*  CREATININE 0.75 0.80  --   --  0.70  TROPONINIHS 76*  --  83* 94*  --     Estimated Creatinine Clearance: 138.4 mL/min (by C-G formula based on SCr of 0.7 mg/dL).   Medical History: Past Medical History:  Diagnosis Date  . Acute on chronic congestive heart failure (HCC)   . Anemia, secondary    SECONDARY TO ACUTE BLOOD LOSS  . Anxiety disorder   . Bloody stool 08/29/2011   intermittent along with constipation.   . 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 Depression   . 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 Dysphagia   . Hyperlipidemia   . Hypertension   . Lupus anticoagulant disorder (HCC) 1990  . Morbid obesity (HCC)   . PFO (patent foramen ovale)    TEE 2/10: EF 60%, trivial AI, mild Ao root dilatation, mod PFO with R-L shunting, atrial septal aneurysm;   echo 7/12: EF 65-70%, grade 1 diast dysfxn, mild MR, LVOT showed severe obstruction  . Protein C deficiency (HCC)   . Protein S deficiency (HCC)   . Rectus sheath hematoma 7/12   required reversal of anticoagulation and c/b DVT req. IVC filter  . Seizure disorder (HCC)     Medications:  Scheduled:  . cycloSPORINE  1 drop Both Eyes BID  . ezetimibe  10 mg Oral Daily  . furosemide  40 mg  Intravenous Q12H  . pantoprazole  40 mg Oral Daily  . phenytoin  200 mg Oral BID WC  . rosuvastatin  40 mg Oral QHS  . senna  1 tablet Oral BID  . sodium chloride flush  3 mL Intravenous Q12H  . warfarin  3 mg Oral Once  . Warfarin - Pharmacist Dosing Inpatient   Does not apply q1600    Assessment: 86 yom admitted for AMS, on warfarin PTA for afib with last dose appearing to be 2/19 PTA. The nursing home [facility] is difficult to read with multiple changes in warfarin order. Most recent PTA regimen appears to be warfarin 3mg  daily. INR subtherapeutic at 1.7 on admit. Patient continues on phenytoin as PTA for hx of seizures.  Warfarin dose was not given last night (likely due to patient being on BIPAP). Patient was transitioned to 4L [place] this morning. INR stable at 1.7 today - anticipate will likely drop further due to missing 2 doses 2/20 and 2/21. CBC wnl. No bleeding issues reported.  Goal of Therapy:  INR 2-3 Monitor platelets by anticoagulation protocol: Yes   Plan:  Warfarin 5mg  PO x 1 - then likely transition back to home dose tomorrow Monitor daily INR, CBC, s/sx bleeding   , PharmD, BCPS Please check AMION for all [facility] contact numbers Clinical Pharmacist 07/31/2020 9:04 AM'

## 2020-07-31 NOTE — Progress Notes (Signed)
  Echocardiogram 2D Echocardiogram has been performed.  Janalyn Harder 07/31/2020, 12:04 PM

## 2020-07-31 NOTE — Consult Note (Signed)
Consultation Note Date: 07/31/2020   Patient Name: James Villegas  DOB: Sep 27, 1955  MRN: [id number]  Age / Sex: 65 y.o., male  PCP: Raymondo Band, MD Referring Physician: Aline August, MD  Reason for Consultation: Establishing goals of care  HPI/Patient Profile: 65 y.o. male  with past medical history of CVA with right sided weakness, atrial fibrillation on Coumadin, HTN, and chronic diastolic CHF who presented to the emergency department from SNF on 07/30/2020 with altered mental status.  ED Course: He was poorly responsive on arrival and hypoxic down to 50% requiring NRB mask. ABG showed hypercapnia and patient was started on BiPAP with improved mental status. Chest x-ray showed cardiomegaly and mild diffuse interstitial opacity most consistent with edema. He was admitted to [facility] for acute chronic respiratory failure with hypoxia and hypercapnia and acute on chronic CHF.   Clinical Assessment and Goals of Care: I have reviewed medical records including EPIC notes, labs and imaging, and met with patient at bedside to discuss diagnosis, prognosis, GOC, EOL wishes, disposition, and options.  I introduced Palliative Medicine as specialized medical care for people living with serious illness. It focuses on providing relief from the symptoms and stress of a serious illness.   We discussed a brief life review of the patient. He is originally from [place]. Relocated to [place] as a teenager with his family. He studied [place] literature at [employer]. After college, he worked various jobs in [employer]. He did not marry or have children. In 2010, he suffered a CVA with residual right hemiplegia and has resided in skilled nursing facilities since then. He enjoys literature and attending virtual church services through an [employer].   As far as functional status, he is non-ambulatory. He  transfers from bed to his power wheelchair via a hoyer lift. He is able to feed himself.    We discussed his current illness and what it means in the larger context of his ongoing co-morbidities. Patient was unaware that he had heart failure. Natural disease trajectory of heart failure was discussed, emphasizing that it is a chronic and progressive disease characterized by recurrent exacerbations.   I attempted to elicit values and goals of care important to the patient. His goal is to return to SNF and his previous level of functioning. The difference between aggressive medical intervention and comfort care was considered in light of the patient's goals of care. He wishes to continue all supportive measures at this time.   We did discuss code status, as patient had rescinded his code status earlier today. I encouraged him to reconsider DNR/DNI status understanding evidenced based poor outcomes in similar hospitalized patients, as the cause of the arrest is likely associated with chronic/terminal disease rather than a reversible acute cardio-pulmonary event. I explained that DNR/DNI does not change the medical plan and it only comes into effect after a person has arrested (died).  It is a protective measure to keep [place] from harming the patient in their last moments of life. Patient states he misunderstood  the meaning of rescind. In the event he arrests, he states "let me go". He was agreeable to DNR/DNI with understanding that he would not receive CPR, defibrillation, ACLS medications, or intubation in the event of cardiopulmonary arrest.   Primary decision maker: Patient. Patient states his next of kin is his sister Johnta Couts. He states she is his POA but does not know if this includes healthcare POA.    SUMMARY OF RECOMMENDATIONS    Code status changed to DNR/DNI  Continue current medical care  Goal of care is to return to his SNF  PMT will continue to follow  Code Status/Advance Care  Planning:  DNR  Symptom Management:   Per primary team  Palliative Prophylaxis:   Turn Reposition  Additional Recommendations (Limitations, Scope, Preferences):  Full Scope Treatment  Psycho-social/Spiritual:   Created space and opportunity for patient and family to express thoughts and feelings regarding patient's current medical situation.   Emotional support provided   Prognosis:   Unable to determine  Discharge Planning: SNF      Primary Diagnoses: Present on Admission: . Pure hypercholesterolemia . Protein S deficiency ([place]) . Protein C deficiency ([place]) . Obesity, Class III, BMI 40-49.9 (morbid obesity) ([place]) . Acute on chronic diastolic CHF (congestive heart failure) ([place]) . Acute metabolic encephalopathy . Acute on chronic respiratory failure with hypercapnia ([place]) . Elevated troponin   I have reviewed the medical record, interviewed the patient and family, and examined the patient. The following aspects are pertinent.  Past Medical History:  Diagnosis Date  . Acute on chronic congestive heart failure ([place])   . Anemia, secondary    SECONDARY TO ACUTE BLOOD LOSS  . Anxiety disorder   . Bloody stool 08/29/2011   intermittent along with constipation.   . 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 Depression   . 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 Dysphagia   . Hyperlipidemia   . Hypertension   . Lupus anticoagulant disorder ([place]) 1990  . Morbid obesity ([place])   . PFO (patent foramen ovale)    TEE 2/10: EF 60%, trivial AI, mild Ao root dilatation, mod PFO with R-L shunting, atrial septal aneurysm;   echo 7/12: EF 65-70%, grade 1 diast dysfxn, mild MR, LVOT showed severe obstruction  . Protein C deficiency ([place])   . Protein S deficiency ([place])   . Rectus sheath hematoma 7/12   required reversal of anticoagulation and c/b DVT req. IVC filter  .  Seizure disorder ([place])     Family History  Problem Relation Age of Onset  . Sleep apnea Sister    Scheduled Meds: . cycloSPORINE  1 drop Both Eyes BID  . ezetimibe  10 mg Oral Daily  . furosemide  40 mg Intravenous Q12H  . pantoprazole  40 mg Oral Daily  . phenytoin  200 mg Oral BID WC  . rosuvastatin  40 mg Oral QHS  . senna  1 tablet Oral BID  . sodium chloride flush  3 mL Intravenous Q12H  . warfarin  5 mg Oral ONCE-1600  . Warfarin - Pharmacist Dosing Inpatient   Does not apply q1600   Continuous Infusions: . sodium chloride     PRN Meds:.sodium chloride, acetaminophen **OR** acetaminophen, bisacodyl, polyethylene glycol, sodium chloride flush Medications Prior to Admission:  Prior to Admission medications   Medication Sig Start Date End Date Taking?  Authorizing Provider  acetaminophen (TYLENOL) 325 MG tablet Take 650 mg by mouth every 6 (six) hours as needed for mild pain or headache.    Yes [provider]  acetaminophen (TYLENOL) 500 MG tablet Take 1,000 mg by mouth 2 (two) times daily with a meal.   Yes [provider]  Carboxymeth-Glycerin-Polysorb (REFRESH OPTIVE ADVANCED OP) Place 1 drop into both eyes 4 (four) times daily. Wait 3-5 minutes between eye meds   Yes [provider]  cycloSPORINE (RESTASIS) 0.05 % ophthalmic emulsion Place 1 drop into both eyes 2 (two) times daily.   Yes [provider]  ezetimibe (ZETIA) 10 MG tablet Take 10 mg by mouth daily.   Yes [provider]  fish oil-omega-3 fatty acids 1000 MG capsule Take 1 g by mouth 2 (two) times daily with a meal.   Yes [provider]  furosemide (LASIX) 40 MG tablet Take 40 mg by mouth 2 (two) times daily with a meal.   Yes [provider]  gabapentin (NEURONTIN) 400 MG capsule Take 400 mg by mouth 2 (two) times daily with a meal.   Yes [provider]  metoprolol tartrate (LOPRESSOR) 25 MG tablet Take 0.5 tablets (12.5 mg total) by mouth  2 (two) times daily. Patient taking differently: Take 12.5 mg by mouth 2 (two) times daily with a meal. 12/28/18  Yes Rosita Fire, Brittainy M, PA-C  omeprazole (PRILOSEC) 40 MG capsule Take 1 capsule (40 mg total) by mouth daily. Patient taking differently: Take 40 mg by mouth daily at 6 (six) AM. 02/28/20  Yes Eugenie Filler, MD  OXYGEN Inhale into the lungs See admin instructions. Nightly 9p-8a   Yes [provider]  phenytoin (DILANTIN) 100 MG ER capsule Take 200 mg by mouth 2 (two) times daily with a meal.   Yes [provider]  rosuvastatin (CRESTOR) 40 MG tablet Take 40 mg by mouth at bedtime.   Yes [provider]  spironolactone (ALDACTONE) 25 MG tablet Take 1 tablet (25 mg total) by mouth daily. 08/07/18  Yes Georgette Shell, MD  traZODone (DESYREL) 50 MG tablet Take 50 mg by mouth at bedtime. 07/08/18  Yes [provider]  venlafaxine (EFFEXOR) 75 MG tablet Take 75 mg by mouth daily. 07/15/18  Yes [provider]  warfarin (COUMADIN) 3 MG tablet Take 3 mg by mouth every evening.   Yes [provider]  albuterol (PROVENTIL) (2.5 MG/3ML) 0.083% nebulizer solution Take 2.5 mg by nebulization once.    [provider]  furosemide (LASIX) 10 MG/ML injection Inject 10 mg into the muscle once.    [provider]   Allergies  Allergen Reactions  . Fenofibrate Other (See Comments)    Per MAR   Review of Systems  All other systems reviewed and are negative.   Physical Exam Vitals reviewed.  Constitutional:      General: He is not in acute distress.    Appearance: He is obese.  Cardiovascular:     Rate and Rhythm: Normal rate. Rhythm irregularly irregular.     Comments: A-fib Pulmonary:     Effort: Pulmonary effort is normal.  Neurological:     Mental Status: He is alert and oriented to person, place, and time.     Motor: Weakness present.     Comments: Right hemiplegia     Vital Signs: BP 121/69   Pulse 81    Temp 97.7 F (36.5 C) (Oral)   Resp 13   Ht  $'6\' 1"'F$  (1.854 m)   Wt 135.6 kg   SpO2 95%   BMI 39.44 kg/m  Pain Scale: 0-10   Pain Score: 0-No pain   SpO2: SpO2: 95 % O2 Device:SpO2: 95 % O2 Flow Rate: .O2 Flow Rate (L/min): 4 L/min  IO: Intake/output summary: No intake or output data in the 24 hours ending 07/31/20 1512  LBM:   Baseline Weight: Weight: 135.6 kg Most recent weight: Weight: 135.6 kg      Palliative Assessment/Data: PPS 30%     Time In: 15:12 Time Out: 16:24 Time Total: 72 minutes Greater than 50%  of this time was spent counseling and coordinating care related to the above assessment and plan.  Signed by: Lavena Bullion, NP   Please contact Palliative Medicine Team phone at [phone] for questions and concerns.  For individual provider: See Shea Evans

## 2020-07-31 NOTE — Progress Notes (Signed)
Patient taken off BiPAP and placed on 4L nasal cannula. Patient awake and alert. RN aware. Vitals stable. RT will continue to monitor.

## 2020-07-31 NOTE — ED Notes (Signed)
Pt requests to have DNR rescinded -- Dr notified.

## 2020-07-31 NOTE — ED Notes (Signed)
Previous RN was able to give report to [facility] staff, bed is currently being cleaned

## 2020-08-01 ENCOUNTER — Other Ambulatory Visit: Payer: Self-pay

## 2020-08-01 ENCOUNTER — Encounter (HOSPITAL_COMMUNITY): Payer: Self-pay | Admitting: Internal Medicine

## 2020-08-01 DIAGNOSIS — I712 Thoracic aortic aneurysm, without rupture: Secondary | ICD-10-CM

## 2020-08-01 DIAGNOSIS — I509 Heart failure, unspecified: Secondary | ICD-10-CM

## 2020-08-01 DIAGNOSIS — I7121 Aneurysm of the ascending aorta, without rupture: Secondary | ICD-10-CM

## 2020-08-01 DIAGNOSIS — R0902 Hypoxemia: Secondary | ICD-10-CM | POA: Diagnosis not present

## 2020-08-01 DIAGNOSIS — R778 Other specified abnormalities of plasma proteins: Secondary | ICD-10-CM | POA: Diagnosis not present

## 2020-08-01 LAB — COMPREHENSIVE METABOLIC PANEL
ALT: 25 U/L (ref 0–44)
AST: 20 U/L (ref 15–41)
Albumin: 2.7 g/dL — ABNORMAL LOW (ref 3.5–5.0)
Alkaline Phosphatase: 87 U/L (ref 38–126)
Anion gap: 12 (ref 5–15)
BUN: 10 mg/dL (ref 8–23)
CO2: 35 mmol/L — ABNORMAL HIGH (ref 22–32)
Calcium: 8.5 mg/dL — ABNORMAL LOW (ref 8.9–10.3)
Chloride: 92 mmol/L — ABNORMAL LOW (ref 98–111)
Creatinine, Ser: 0.57 mg/dL — ABNORMAL LOW (ref 0.61–1.24)
GFR, Estimated: >60 mL/min (ref >60)
Glucose, Bld: 94 mg/dL (ref 70–99)
Potassium: 4 mmol/L (ref 3.5–5.1)
Sodium: 139 mmol/L (ref 135–145)
Total Bilirubin: 0.9 mg/dL (ref 0.3–1.2)
Total Protein: 5.4 g/dL — ABNORMAL LOW (ref 6.5–8.1)

## 2020-08-01 LAB — CBC WITH DIFFERENTIAL/PLATELET
Abs Immature Granulocytes: 0.06 10*3/uL (ref 0.00–0.07)
Basophils Absolute: 0.1 10*3/uL (ref 0.0–0.1)
Basophils Relative: 1 %
Eosinophils Absolute: 0.7 10*3/uL — ABNORMAL HIGH (ref 0.0–0.5)
Eosinophils Relative: 8 %
HCT: 44.1 % (ref 39.0–52.0)
Hemoglobin: 14.5 g/dL (ref 13.0–17.0)
Immature Granulocytes: 1 %
Lymphocytes Relative: 11 %
Lymphs Abs: 1 10*3/uL (ref 0.7–4.0)
MCH: 31.1 pg (ref 26.0–34.0)
MCHC: 32.9 g/dL (ref 30.0–36.0)
MCV: 94.6 fL (ref 80.0–100.0)
Monocytes Absolute: 1 10*3/uL (ref 0.1–1.0)
Monocytes Relative: 12 %
Neutro Abs: 6.1 10*3/uL (ref 1.7–7.7)
Neutrophils Relative %: 67 %
Platelets: 229 10*3/uL (ref 150–400)
RBC: 4.66 MIL/uL (ref 4.22–5.81)
RDW: 14.3 % (ref 11.5–15.5)
WBC: 8.9 10*3/uL (ref 4.0–10.5)
nRBC: 0 % (ref 0.0–0.2)

## 2020-08-01 LAB — MRSA PCR SCREENING: MRSA by PCR: NEGATIVE

## 2020-08-01 LAB — PROTIME-INR
INR: 1.6 — ABNORMAL HIGH (ref 0.8–1.2)
Prothrombin Time: 18.7 seconds — ABNORMAL HIGH (ref 11.4–15.2)

## 2020-08-01 LAB — MAGNESIUM: Magnesium: 1.7 mg/dL (ref 1.7–2.4)

## 2020-08-01 MED ORDER — ENSURE ENLIVE PO LIQD
237.0000 mL | ORAL | Status: DC
  Administered 2020-08-01 – 2020-08-06 (×6): 237 mL via ORAL

## 2020-08-01 MED ORDER — WARFARIN SODIUM 5 MG PO TABS
5.0000 mg | ORAL_TABLET | Freq: Once | ORAL | Status: AC
  Administered 2020-08-01: 5 mg via ORAL
  Filled 2020-08-01: qty 1

## 2020-08-01 MED ORDER — ADULT MULTIVITAMIN W/MINERALS CH
1.0000 | ORAL_TABLET | Freq: Every day | ORAL | Status: DC
  Administered 2020-08-01 – 2020-08-07 (×7): 1 via ORAL
  Filled 2020-08-01 (×7): qty 1

## 2020-08-01 NOTE — Consult Note (Signed)
WOC Nurse Consult Note: Patient receiving care in [facility] 2C09.   Reason for Consult: sacral and abdominal wounds Wound type: right abdomen has an old cyst area that nothing could be expressed out of, no surrounding induration or erythema.  It doesn't need a dressing, only routine hygiene.  It currently has a foam dressing over it.  The right buttock has scattered abrasions that are "itchy" per patient.  The area is moist.  No surrounding induration or erythema. Pressure Injury POA: Yes/No/NA Measurement: Wound bed: Drainage (amount, consistency, odor)  Periwound: Dressing procedure/placement/frequency:  Wash buttocks with no rinse spray, thoroughly dry. Apply antifungal powder (green and white bottle in clean utility) -- especially to the abrasions on the right buttock. Thank you for the consult.  Discussed plan of care with the patient.  WOC nurse will not follow at this time.  Please re-consult the WOC team if needed.  Helmut Muster, RN, MSN, CWOCN, CNS-BC, pager [phone]

## 2020-08-01 NOTE — Progress Notes (Signed)
Pts BIPAP order is PRN at this time. Pts respiratory status is stable on [place] 2 LPM w/no distress noted. RT will continue to monitor.

## 2020-08-01 NOTE — Progress Notes (Signed)
Progress Note  Patient Name: James Villegas Date of Encounter: 08/01/2020  [facility] Cardiologist: Tobias Alexander, MD   Subjective   Feeling well.  No change in his respiratory status.  Denies any pain.  Inpatient Medications    Scheduled Meds: . Chlorhexidine Gluconate Cloth  6 each Topical Daily  . cycloSPORINE  1 drop Both Eyes BID  . ezetimibe  10 mg Oral Daily  . furosemide  40 mg Intravenous Q12H  . metoprolol tartrate  12.5 mg Oral BID  . pantoprazole  40 mg Oral Daily  . phenytoin  200 mg Oral BID WC  . rosuvastatin  40 mg Oral QHS  . senna  1 tablet Oral BID  . sodium chloride flush  3 mL Intravenous Q12H  . Warfarin - Pharmacist Dosing Inpatient   Does not apply q1600   Continuous Infusions: . sodium chloride     PRN Meds: sodium chloride, acetaminophen **OR** acetaminophen, bisacodyl, polyethylene glycol, sodium chloride flush   Vital Signs    Vitals:   07/31/20 1706 07/31/20 1852 07/31/20 2204 08/01/20 0008  BP: 123/68  107/61 111/69  Pulse: 83  88 81  Resp: 16  15 15   Temp: 98.4 F (36.9 C)  98.1 F (36.7 C) 98.2 F (36.8 C)  TempSrc: Oral  Oral Oral  SpO2: 95% 92% 96% 92%  Weight:      Height:        Intake/Output Summary (Last 24 hours) at 08/01/2020 0831 Last data filed at 08/01/2020 0530 Gross per 24 hour  Intake 480 ml  Output 4850 ml  Net -4370 ml   Last 3 Weights 07/30/2020 02/27/2020 02/26/2020  Weight (lbs) 298 lb 15.1 oz 299 lb 9.6 oz 300 lb 4.8 oz  Weight (kg) 135.6 kg 135.898 kg 136.215 kg      Telemetry    Atrial fibrillation.  Rates less than 100 bpm.- Personally Reviewed  ECG    N/A- Personally Reviewed  Physical Exam   VS:  BP 111/69 (BP Location: Right Arm)   Pulse 81   Temp 98.2 F (36.8 C) (Oral)   Resp 15   Ht 6\' 1"  (1.854 m)   Wt 135.6 kg   SpO2 92%   BMI 39.44 kg/m  , BMI Body mass index is 39.44 kg/m. GENERAL: Chronically ill-appearing.  No acute distress. HEENT: Pupils equal round and reactive,  fundi not visualized, oral mucosa unremarkable NECK:  + jugular venous distention, waveform within normal limits, carotid upstroke brisk and symmetric LUNGS:  Clear to auscultation bilaterally HEART:  RRR.  PMI not displaced or sustained,S1 and S2 within normal limits, no S3, no S4, no clicks, no rubs, no murmurs ABD:  Flat, positive bowel sounds normal in frequency in pitch, no bruits, no rebound, no guarding, no midline pulsatile mass, no hepatomegaly, no splenomegaly EXT:  2 plus pulses throughout, 2++ lower extremity edema, no cyanosis no clubbing SKIN:  No rashes no nodules NEURO:  Cranial nerves II through XII grossly intact, motor grossly intact throughout PSYCH:  Cognitively intact, oriented to person place and time   Labs    High Sensitivity Troponin:   Recent Labs  Lab 07/30/20 1627 07/30/20 1821 07/31/20 0100  TROPONINIHS 76* 83* 94*      Chemistry Recent Labs  Lab 07/30/20 1627 07/30/20 1645 07/31/20 0500 08/01/20 0044  NA 131* 129*  129* 132* 139  K 4.5 4.2  4.3 3.9 4.0  CL 88* 86* 89* 92*  CO2 32  --  34* 35*  GLUCOSE 124* 124* 93 94  BUN CREATININE 0.75 0.80 0.70 0.57*  CALCIUM 8.4*  --  8.5* 8.5*  PROT 6.2*  --  5.3* 5.4*  ALBUMIN 3.1*  --  2.6* 2.7*  AST 25  --  19 20  ALT 29  --  24 25  ALKPHOS 104  --  91 87  BILITOT 0.8  --  1.0 0.9  GFRNONAA >60  --  >60 >60  ANIONGAP 11  --  9 12     Hematology Recent Labs  Lab 07/30/20 1627 07/30/20 1645 07/31/20 0500 08/01/20 0044  WBC 9.5  --  8.2 8.9  RBC 4.96  --  4.65 4.66  HGB 15.1 16.3  16.7 14.1 14.5  HCT 47.8 48.0  49.0 45.0 44.1  MCV 96.4  --  96.8 94.6  MCH 30.4  --  30.3 31.1  MCHC 31.6  --  31.3 32.9  RDW 14.6  --  14.3 14.3  PLT 279  --  207 229    BNP Recent Labs  Lab 07/30/20 1627  BNP 960.3*     DDimer  Recent Labs  Lab 07/30/20 1627  DDIMER 0.57*     Radiology    DG Chest Port 1 View  Result Date: 07/30/2020 CLINICAL DATA:  Shortness of breath  EXAM: PORTABLE CHEST 1 VIEW COMPARISON:  02/20/2020 FINDINGS: Cardiomegaly. Unchanged prominence of the pulmonary vasculature and mild, diffuse interstitial opacity. The visualized skeletal structures are unremarkable. IMPRESSION: Cardiomegaly with unchanged prominence of the pulmonary vasculature and mild, diffuse interstitial opacity, most consistent with edema. No focal airspace opacity. Electronically Signed   By: Lauralyn Primes M.D.   On: 07/30/2020 16:35   ECHOCARDIOGRAM COMPLETE  Result Date: 07/31/2020    ECHOCARDIOGRAM REPORT   Patient Name:   James Villegas Date of Exam: 07/31/2020 Medical Rec #:  [id number]      Height:       73.0 in Accession #:    [id number]     Weight:       298.9 lb Date of Birth:  [birth year]     BSA:          2.552 m Patient Age:    64 years       BP:           112/88 mmHg Patient Gender: M              HR:           81 bpm. Exam Location:  Inpatient Procedure: 2D Echo, Cardiac Doppler, Color Doppler and Intracardiac            Opacification Agent Indications:    I50.40* Unspecified combined systolic (congestive) and diastolic                 (congestive) heart failure  History:        Patient has prior history of Echocardiogram examinations, most                 recent 08/24/2019. CHF, Abnormal ECG, Stroke, Arrythmias:Atrial                 Fibrillation, Signs/Symptoms:Altered Mental Status; Risk                 Factors:Hypertension and Dyslipidemia. Seizure. PFO.  Sonographer:    Sheralyn Boatman RDCS Referring Phys: [id number] ANASTASSIA DOUTOVA  Sonographer Comments: Technically difficult study due to poor echo windows and patient  is morbidly obese. Image acquisition challenging due to patient body habitus. IMPRESSIONS  1. Left ventricular ejection fraction, by estimation, is 65 to 70%. The left ventricle has normal function. The left ventricle has no regional wall motion abnormalities. There is severe eccentric left ventricular hypertrophy. Left ventricular diastolic function could not be  evaluated.  2. Right ventricular systolic function is normal. The right ventricular size is normal. There is moderately elevated pulmonary artery systolic pressure.  3. The mitral valve is normal in structure. Trivial mitral valve regurgitation. No evidence of mitral stenosis.  4. The aortic valve is grossly normal. Aortic valve regurgitation is trivial. No aortic stenosis is present.  5. Aortic dilatation noted. There is moderate dilatation of the ascending aorta, measuring 47 mm.  6. The inferior vena cava is dilated in size with <50% respiratory variability, suggesting right atrial pressure of 15 mmHg. Comparison(s): No significant change from prior study. FINDINGS  Left Ventricle: Left ventricular ejection fraction, by estimation, is 65 to 70%. The left ventricle has normal function. The left ventricle has no regional wall motion abnormalities. Definity contrast agent was given IV to delineate the left ventricular  endocardial borders. The left ventricular internal cavity size was normal in size. There is severe eccentric left ventricular hypertrophy. Left ventricular diastolic function could not be evaluated due to atrial fibrillation. Left ventricular diastolic function could not be evaluated. Right Ventricle: The right ventricular size is normal. Right vetricular wall thickness was not well visualized. Right ventricular systolic function is normal. There is moderately elevated pulmonary artery systolic pressure. The tricuspid regurgitant velocity is 2.91 m/s, and with an assumed right atrial pressure of 15 mmHg, the estimated right ventricular systolic pressure is 48.9 mmHg. Left Atrium: Left atrial size was normal in size. Right Atrium: Right atrial size was normal in size. Pericardium: There is no evidence of pericardial effusion. Mitral Valve: The mitral valve is normal in structure. Trivial mitral valve regurgitation. No evidence of mitral valve stenosis. Tricuspid Valve: The tricuspid valve is normal in  structure. Tricuspid valve regurgitation is mild . No evidence of tricuspid stenosis. Aortic Valve: The aortic valve is grossly normal. Aortic valve regurgitation is trivial. No aortic stenosis is present. Pulmonic Valve: The pulmonic valve was not well visualized. Pulmonic valve regurgitation is not visualized. Aorta: Aortic dilatation noted. There is moderate dilatation of the ascending aorta, measuring 47 mm. Venous: The inferior vena cava is dilated in size with less than 50% respiratory variability, suggesting right atrial pressure of 15 mmHg. IAS/Shunts: The atrial septum is grossly normal.  LEFT VENTRICLE PLAX 2D LVIDd:         4.10 cm LVIDs:         2.90 cm LV PW:         1.90 cm LV IVS:        2.10 cm LVOT diam:     1.90 cm LV SV:         56 LV SV Index:   22 LVOT Area:     2.84 cm  LV Volumes (MOD) LV vol d, MOD A2C: 69.9 ml LV vol d, MOD A4C: 68.0 ml LV vol s, MOD A2C: 15.4 ml LV vol s, MOD A4C: 22.2 ml LV SV MOD A2C:     54.5 ml LV SV MOD A4C:     68.0 ml LV SV MOD BP:      48.6 ml RIGHT VENTRICLE            IVC RV S prime:  8.73 cm/s  IVC diam: 2.80 cm TAPSE (M-mode): 2.1 cm LEFT ATRIUM             Index       RIGHT ATRIUM           Index LA diam:        3.60 cm 1.41 cm/m  RA Area:     15.60 cm LA Vol (A2C):   40.4 ml 15.83 ml/m RA Volume:   44.70 ml  17.51 ml/m LA Vol (A4C):   45.2 ml 17.71 ml/m LA Biplane Vol: 47.1 ml 18.45 ml/m  AORTIC VALVE LVOT Vmax:   123.00 cm/s LVOT Vmean:  86.100 cm/s LVOT VTI:    0.196 m  AORTA Ao Root diam: 4.10 cm Ao Asc diam:  4.40 cm MITRAL VALVE               TRICUSPID VALVE MV Area (PHT): 4.12 cm    TR Peak grad:   33.9 mmHg MV Decel Time: 184 msec    TR Vmax:        291.00 cm/s MV E velocity: 73.40 cm/s                            SHUNTS                            Systemic VTI:  0.20 m                            Systemic Diam: 1.90 cm Jodelle Red MD Electronically signed by Jodelle Red MD Signature Date/Time: 07/31/2020/3:59:17 PM    Final      Cardiac Studies   Echo 07/31/20: 1. Left ventricular ejection fraction, by estimation, is 65 to 70%. The  left ventricle has normal function. The left ventricle has no regional  wall motion abnormalities. There is severe eccentric left ventricular  hypertrophy. Left ventricular diastolic  function could not be evaluated.  2. Right ventricular systolic function is normal. The right ventricular  size is normal. There is moderately elevated pulmonary artery systolic  pressure.  3. The mitral valve is normal in structure. Trivial mitral valve  regurgitation. No evidence of mitral stenosis.  4. The aortic valve is grossly normal. Aortic valve regurgitation is  trivial. No aortic stenosis is present.  5. Aortic dilatation noted. There is moderate dilatation of the ascending  aorta, measuring 47 mm.  6. The inferior vena cava is dilated in size with <50% respiratory  variability, suggesting right atrial pressure of 15 mmHg.   Patient Profile     Mr. Rhinehart is a 65 year old man with prior stroke with residual right hemiparesis, wheelchair-bound nursing home resident, rectus sheath hematoma, coagulopathy, persistent atrial fibrillation on warfarin, ascending aortic aneurysm, hypertension, morbid obesity with obesity hypoventilation syndrome, and ascending aortic aneurysm admitted with hypoxic and hypercarbic respiratory failure in the setting of acute on chronic diastolic heart failure and obesity hypoventilation syndrome.     Assessment & Plan    # Hypoxic/hypercarbic respiratory failure: # OSA:  Needs sleep study with [facility] lift.  Query whether it is possible to get a CPAP/BiPAP with some basic settings or inpatient titration.  Understandably, this is not a typical practice.  However may be reasonable for his circumstances if possible.  # Acute on chronic diastolic heart failure:  BNP elevated to 960.  Likely underestimates  his degree of volume overload 2/2 morbid obesity.  Continue  diuresis with lasix 40 mg IV bid.  He was net -4.3L yesterday.  Renal function stable.  We will also order Unna boots.  # Persistent atrial fibrillation:  Rates well-controlled.  Continue warfarin and metoprolol.  # Morbid obesity:  # Bed bound: # Prior stroke: Immobilized 2/2 morbid obesity and prior stroke with hemiparesis.  Unable to stand anymore 2/2 pain in L knee.   #Ascending aortic aneurysm: Noted to have a 4.7 cm ascending aortic aneurysm on echo yesterday.  Blood pressure control as above.  It was 4.5 cm on 08/2019.  He is scheduled for CT-A on 09/2020.     For questions or updates, please contact [facility] Please consult [email or web address] for contact info under        Signed, Chilton Siiffany [place], MD  08/01/2020, 8:31 AM

## 2020-08-01 NOTE — Evaluation (Signed)
Occupational Therapy Evaluation and Discharge from Acute services Patient Details Name: James Villegas MRN: [id number] DOB: 1956-01-10 Today's Date: 08/01/2020    History of Present Illness 65 y.o. male with medical history significant of CVA with right side weakness, a.fib on coumadin, HTN, obesity, bradycardia chronic, hypercapnic respiratory failure chronic, seizure disorder, chronic diastolic CHF, history of stroke, HLD,   Clinical Impression   Pt is resident of SNF since CVA. R side is flaccid at baseline, however Pt enjoys music, navigates is his power WC with L hand/LUE and assists with rolling at bed level. Pt transfers via hoyer lift. For ADL and transfers, Pt is at baseline. He is able to engage in grooming and self-feeding at set up level - successfully hoyer lifted to recliner (geo-mat in place) and while he demonstrated confusion and needed supplemental O2 today (dropped down to 92% on RA) he is at baseline for ADL and OT will sign off acutely.     Follow Up Recommendations  SNF (return to SNF)    Equipment Recommendations  None recommended by OT    Recommendations for Other Services       Precautions / Restrictions Precautions Precautions: Fall Precaution Comments: R side is flaccid (previous CVA) Restrictions Weight Bearing Restrictions: No      Mobility Bed Mobility Overal bed mobility: Needs Assistance Bed Mobility: Rolling Rolling: Min guard;Total assist (min guard to right, total A to left)         General bed mobility comments: rolling for lift pad placement    Transfers Overall transfer level: Needs assistance               General transfer comment: Pt is hoyer lift at baseline. RUE needs to be placed in safe position for patient, and RLE needs to be held for transfer    Balance                                           ADL either performed or assessed with clinical judgement   ADL Overall ADL's : At baseline                                        General ADL Comments: able to particate in grooming and self-feeding with set up, as well as rolling at bed level for toilet needs, bathing, dressing     Vision Baseline Vision/History: Wears glasses Wears Glasses: At all times Patient Visual Report: No change from baseline       Perception     Praxis      Pertinent Vitals/Pain Pain Assessment: Faces Faces Pain Scale: No hurt Pain Intervention(s): Monitored during session;Repositioned     Hand Dominance Left   Extremity/Trunk Assessment Upper Extremity Assessment Upper Extremity Assessment: RUE deficits/detail RUE Deficits / Details: flaccid at baseline from previous CVA, edemous throughout RUE   Lower Extremity Assessment Lower Extremity Assessment: RLE deficits/detail (BLE with edema) RLE Deficits / Details: flaccid at baseline from previous CVA - requires support during hoyer lift   Cervical / Trunk Assessment Cervical / Trunk Assessment: Other exceptions Cervical / Trunk Exceptions: excess body habitus   Communication Communication Communication: Expressive difficulties (extra time to formulate sentences (baseline))   Cognition Arousal/Alertness: Awake/alert Behavior During Therapy: WFL for tasks assessed/performed Overall Cognitive Status: Impaired/Different from  baseline Area of Impairment: Orientation;Memory;Awareness;Problem solving                 Orientation Level: Disoriented to;Situation;Time ("I've had a stroke" and couldn't identify day of the week)   Memory: Decreased short-term memory     Awareness: Intellectual Problem Solving: Decreased initiation;Difficulty sequencing;Requires verbal cues;Requires tactile cues General Comments: Pt very pleasant overall and is oriented x3, pereverates on topics but easily re-directed   General Comments  Pt likes Julieta Gutting and music, and studied [place]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     Exercises     Shoulder Instructions      Home Living Family/patient expects to be discharged to:: Skilled nursing facility                                 Additional Comments: has resided at [facility] since CVA - most recently [place]      Prior Functioning/Environment Level of Independence: Needs assistance  Gait / Transfers Assistance Needed: Michiel Sites to wc, manuevers wheel chair with Left side extremities. ADL's / Homemaking Assistance Needed: Able to perform grooming and feeding with setup. Toileting, bathing and dressing at bed level.            OT Problem List: Decreased cognition;Obesity;Cardiopulmonary status limiting activity      OT Treatment/Interventions:      OT Goals(Current goals can be found in the care plan section) Acute Rehab OT Goals Patient Stated Goal: get off Oxygen OT Goal Formulation: With patient Time For Goal Achievement: 08/15/20 Potential to Achieve Goals: Good  OT Frequency:     Barriers to D/C:            Co-evaluation PT/OT/SLP Co-Evaluation/Treatment: Yes Reason for Co-Treatment: Necessary to address cognition/behavior during functional activity;For patient/therapist safety;To address functional/ADL transfers PT goals addressed during session: Balance;Proper use of DME;Strengthening/ROM OT goals addressed during session: ADL's and self-care      AM-PAC OT "6 Clicks" Daily Activity     Outcome Measure Help from another person eating meals?: A Little Help from another person taking care of personal grooming?: A Little Help from another person toileting, which includes using toliet, bedpan, or urinal?: A Lot Help from another person bathing (including washing, rinsing, drying)?: A Lot Help from another person to put on and taking off regular upper body clothing?: A Lot Help from another person to put on and taking off regular lower body clothing?: Total 6 Click Score: 13   End of Session Equipment Utilized During  Treatment: Oxygen (2L) Nurse Communication: Mobility status;Need for lift equipment  Activity Tolerance: Patient tolerated treatment well Patient left: in chair;with call bell/phone within reach;with chair alarm set;Other (comment) (hoyer pad still underneath)  OT Visit Diagnosis: Muscle weakness (generalized) (M62.81);Other symptoms and signs involving cognitive function                Time: 1010-1048 OT Time Calculation (min): 38 min Charges:  OT General Charges $OT Visit: 1 Visit OT Evaluation $OT Eval Moderate Complexity: 1 Mod  Nyoka Cowden OTR/L Acute Rehabilitation Services Pager: [phone] Office: [phone]  Evern Bio Mireille Lacombe 08/01/2020, 1:08 PM

## 2020-08-01 NOTE — Evaluation (Signed)
Clinical/Bedside Swallow Evaluation Patient Details  Name: James Villegas MRN: [id number] Date of Birth: [birth year]  Today's Date: 08/01/2020 Time: SLP Start Time (ACUTE ONLY): 6144 SLP Stop Time (ACUTE ONLY): 0930 SLP Time Calculation (min) (ACUTE ONLY): 12 min  Past Medical History:  Past Medical History:  Diagnosis Date  . Acute on chronic congestive heart failure (HCC)   . Anemia, secondary    SECONDARY TO ACUTE BLOOD LOSS  . Anxiety disorder   . Bloody stool 08/29/2011   intermittent along with constipation.   . 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 Depression   . 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 Dysphagia   . Hyperlipidemia   . Hypertension   . Lupus anticoagulant disorder (HCC) 1990  . Morbid obesity (HCC)   . PFO (patent foramen ovale)    TEE 2/10: EF 60%, trivial AI, mild Ao root dilatation, mod PFO with R-L shunting, atrial septal aneurysm;   echo 7/12: EF 65-70%, grade 1 diast dysfxn, mild MR, LVOT showed severe obstruction  . Protein C deficiency (HCC)   . Protein S deficiency (HCC)   . Rectus sheath hematoma 7/12   required reversal of anticoagulation and c/b DVT req. IVC filter  . Seizure disorder [facility])    Past Surgical History:  Past Surgical History:  Procedure Laterality Date  . dental extraction     multiple  . vena cavogram  12/2010   INFERIOR   HPI:  Pt is a 65 y.o. male presenting with acute on chronic respiratory failure with hypoxia and hypercapnia. CXR from 2/21 showed diffuse interstitial opacity, most consistent with edema;  no focal airspace opacity. PMH: dysphagia, CVA, HTN, hyperlipidemia, baseline CN deficits. BSEs on 08/03/2018 and 02/21/2020 revealed functional oropharyngeal swallow. Noted to have cervical osteophytes at C5-C6 that did not appear to impact his swallowing.   Assessment / Plan / Recommendation Clinical  Impression  Pt appears to have a functional oropharyngeal swallow of all consistencies despite baseline CN deficits from previous CVA. Intermittent throat clears were noted at the end of today's assessment with mixed consistencies; however, it appeared that pt was still appropriately protecting his airway - question more of an esophageal issue. Given the above findings, recomend that pt remain on a regular diet with thin liquids - SLP will sign off at this time.  SLP Visit Diagnosis: Dysphagia, unspecified (R13.10)    Aspiration Risk  Mild aspiration risk    Diet Recommendation Regular;Thin liquid   Liquid Administration via: Cup;Straw Medication Administration: Whole meds with liquid Supervision: Patient able to self feed;Intermittent supervision to cue for compensatory strategies Postural Changes: Seated upright at 90 degrees;Remain upright for at least 30 minutes after po intake    Other  Recommendations Oral Care Recommendations: Oral care BID   Follow up Recommendations None      Frequency and Duration            Prognosis Prognosis for Safe Diet Advancement: Good      Swallow Study   General HPI: Pt is a 65 y.o. male presenting with acute on chronic respiratory failure with hypoxia and hypercapnia. CXR from 2/21 showed diffuse interstitial opacity, most consistent with edema;  no focal airspace opacity. PMH: dysphagia, CVA, HTN, hyperlipidemia, baseline CN deficits. BSEs on 08/03/2018 and 02/21/2020 revealed functional oropharyngeal swallow. Noted to have cervical osteophytes at C5-C6 that did not appear  to impact his swallowing. Type of Study: Bedside Swallow Evaluation Previous Swallow Assessment: Yes Diet Prior to this Study: Regular;Thin liquids Temperature Spikes Noted: No Respiratory Status: Nasal cannula History of Recent Intubation: No Behavior/Cognition: Alert;Cooperative;Pleasant mood Oral Cavity Assessment: Within Functional Limits Oral Care Completed by SLP:  No Oral Cavity - Dentition: Adequate natural dentition Vision: Functional for self-feeding Self-Feeding Abilities: Able to feed self Patient Positioning: Upright in bed Baseline Vocal Quality: Normal Volitional Cough: Strong Volitional Swallow: Able to elicit    Oral/Motor/Sensory Function Overall Oral Motor/Sensory Function: Moderate impairment Facial ROM: Reduced right Facial Symmetry: Suspected CN VII (facial) dysfunction;Abnormal symmetry right Facial Strength: Within Functional Limits Facial Sensation: Within Functional Limits Lingual ROM: Within Functional Limits Lingual Symmetry: Within Functional Limits Lingual Strength: Within Functional Limits Lingual Sensation: Within Functional Limits Velum: Within Functional Limits Mandible: Within Functional Limits   Ice Chips Ice chips: Not tested   Thin Liquid Thin Liquid: Within functional limits Presentation: Cup;Self Fed;Straw    Nectar Thick Nectar Thick Liquid: Not tested   Honey Thick Honey Thick Liquid: Not tested   Puree Puree: Within functional limits Presentation: Self Fed;Spoon   Solid     Solid: Within functional limits Presentation: Self Fed      Zettie Cooley., SLP Student 08/01/2020,10:57 AM

## 2020-08-01 NOTE — Progress Notes (Signed)
Initial Nutrition Assessment  DOCUMENTATION CODES:   Obesity unspecified  INTERVENTION:   -Ensure Enlive po daily, each supplement provides 350 kcal, 20 grams protein   -Magic Cup po BID, each supplement provides 290 kcal, 9 grams protein  -MVI with minerals po daily  NUTRITION DIAGNOSIS:   Increased nutrient needs related to chronic illness (CHF) as evidenced by estimated needs.  GOAL:   Patient will meet greater than or equal to 90% of their needs  MONITOR:   PO intake,Supplement acceptance,Skin,Weight trends  REASON FOR ASSESSMENT:   Consult Assessment of nutrition requirement/status  ASSESSMENT:   45 YOM admitted for CHF exacerbation. PMH of CVA with R weakness, DVT of lower extremities, chronic hypercapnic resp failure, A. Fib, chronic diastolic CHF, HLD, HTN, Protein C and S def.  Per chart, pt has been consuming 100% of meals since 02/22. Pt mentioned that he has had a good appetite since being admitted. He discussed with pt that he had been finishing his trays and enjoying the food. He also said he had a good appetite PTA and typically consumed 3 meals a day:  Breakfast - scrambled eggs, french toast, bacon, juice  Lunch - hamburger with french fries, juice  Dinner - spaghetti, salad, bread, juice  Pt does have R hemiparesis, but denied any problems feeding himself.   Pt's weights reviewed and show no significant changes. Pt discussed with intern that his UBW is 300#. He denied any recent weight changes. Admit and Current Weight: 298.94#  Pt mentioned that he is able to ambulate without assistance. No differences noted on NFPE in regards to depletion given pt's R hemiparesis. However, swelling was noted on R upper extremities.   Meds Reviewed: Lasix (BID), Senekot (BID)  Labs Reviewed.  NUTRITION - FOCUSED PHYSICAL EXAM:  Flowsheet Row Most Recent Value  Orbital Region No depletion  Upper Arm Region No depletion  Thoracic and Lumbar Region No depletion   Buccal Region No depletion  Temple Region No depletion  Clavicle Bone Region No depletion  Clavicle and Acromion Bone Region No depletion  Scapular Bone Region No depletion  Dorsal Hand Mild depletion  Patellar Region No depletion  Anterior Thigh Region No depletion  Posterior Calf Region No depletion  Edema (RD Assessment) Moderate  [bilateral lower extremities, right upper extremity and hand]  Hair Reviewed  Eyes Reviewed  Mouth Reviewed  Skin Reviewed  Nails Reviewed       Diet Order:   Diet Order            Diet Heart Room service appropriate? Yes; Fluid consistency: Thin; Fluid restriction: 1200 mL Fluid  Diet effective now                 EDUCATION NEEDS:   No education needs have been identified at this time  Skin:  Skin Assessment: Skin Integrity Issues: Skin Integrity Issues:: Incisions Incisions: lower R abdomen, upper sacrum, L buttocks  Last BM:  02/22 (type 6)  Height:   Ht Readings from Last 1 Encounters:  07/30/20 6\' 1"  (1.854 m)    Weight:   Wt Readings from Last 1 Encounters:  07/30/20 135.6 kg    Ideal Body Weight:  83.6 kg  BMI:  Body mass index is 39.44 kg/m.  Estimated Nutritional Needs:   Kcal:  2400-2600  Protein:  115-130 g  Fluid:  1.2 L    08/01/20, Dietetic Intern 08/01/2020 3:30 PM

## 2020-08-01 NOTE — Progress Notes (Signed)
Patient ID: James ForthDavid B Villegas, male   DOB: Dec 20, 1955, 65 y.o.   MRN: [id number]  PROGRESS NOTE    James Villegas  [id number] DOB: Dec 20, 1955 DOA: 07/30/2020 PCP: James DupesBlake, Khashana A, MD   Brief Narrative:  65 year old male with history of unspecified CVA with right-sided weakness, Villegas. fib on Coumadin, hypertension, obesity, bradycardia, chronic hypercapnic respiratory failure, seizure disorder, chronic diastolic CHF, hyperlipidemia presented with altered mental status from his nursing facility.  Patient was poorly responsive on presentation and was hypoxic down to 50% on room air requiring nonrebreather.  ABG showed hypercapnia and patient was started on BiPAP with some improvement in mental status.  He was given intravenous Lasix.  Assessment & Plan:   Acute on chronic respiratory failure with hypoxia and hypercapnia Acute on chronic diastolic CHF Acute metabolic encephalopathy Mild elevated troponins -Presented with altered mental status and hypoxia/hypercapnia requiring BiPAP and intravenous Lasix.  Chest x-ray on presentation showed pulmonary edema.  COVID-19 and flu testing were negative -Mental status has much improved.   -Currently on 4 L oxygen via nasal cannula -Troponins did not trend up continue intravenous Lasix.  Strict input and output.  Daily weights.  Fluid restriction.  Negative balance of 4270 cc since admission -Cardiology following: Continue intravenous Lasix.  Continue metoprolol -Diet as aggressive recommendations -Patient has had difficulty obtaining outpatient sleep study.  Chronic Villegas. Fib/protein S and protein C deficiency -Continue Coumadin.  Monitor INR -Continue metoprolol as per cardiology -Currently rate controlled  Morbid obesity -Outpatient follow-up  Hyperlipidemia -Continue rosuvastatin  History of seizure disorder -Continue phenytoin.  No recent seizures reported  Generalized deconditioning -Overall prognosis is guarded to poor.  Palliative care  evaluation appreciated. -CODE STATUS has not changed back to DNR after palliative care discussion   DVT prophylaxis: Coumadin Code Status: DNR Family Communication: None Disposition Plan: Status is: Inpatient  Remains inpatient appropriate because:Inpatient level of care appropriate due to severity of illness   Dispo: The patient is from: SNF              Anticipated d/c is to: SNF              Anticipated d/c date is: 2 days              Patient currently is not medically stable to d/c.   Difficult to place patient No  Consultants: Cardiology/palliative care  Procedures: None  Antimicrobials: None   Subjective: Patient seen and examined at bedside.  Poor historian.  No overnight fever, vomiting, chest pain reported.  Still complains of shortness of breath intermittently. Objective: Vitals:   07/31/20 1706 07/31/20 1852 07/31/20 2204 08/01/20 0008  BP: 123/68  107/61 111/69  Pulse: 83  88 81  Resp: 16  15 15   Temp: 98.4 F (36.9 C)  98.1 F (36.7 C) 98.2 F (36.8 C)  TempSrc: Oral  Oral Oral  SpO2: 95% 92% 96% 92%  Weight:      Height:        Intake/Output Summary (Last 24 hours) at 08/01/2020 0713 Last data filed at 08/01/2020 0530 Gross per 24 hour  Intake 480 ml  Output 4850 ml  Net -4370 ml   Filed Weights   07/30/20 1610  Weight: 135.6 kg    Examination:  General exam: Looks chronically ill.  Looks older than stated age.  Currently on 4 L oxygen via nasal cannula oxygen  respiratory system: Decreased breath sounds at bases bilaterally with basilar crackles cardiovascular system: Rate controlled, S1-S2  heard  gastrointestinal system: Abdomen is obese, slightly distended soft and nontender.  Bowel sounds are heard  extremities: Bilateral lower extremity to decrease edema present; no clubbing  Central nervous system: Still slow to respond.  Alert and awake.  Poor historian.  No focal neurological deficits.  Moves extremities Skin: No obvious  ecchymosis/lesions  psychiatry: Affect is flat    Data Reviewed: I have personally reviewed following labs and imaging studies  CBC: Recent Labs  Lab 07/30/20 1627 07/30/20 1645 07/31/20 0500 08/01/20 0044  WBC 9.5  --  8.2 8.9  NEUTROABS 7.5  --  6.1 6.1  HGB 15.1 16.3  16.7 14.1 14.5  HCT 47.8 48.0  49.0 45.0 44.1  MCV 96.4  --  96.8 94.6  PLT 279  --  207 229   Basic Metabolic Panel: Recent Labs  Lab 07/30/20 1627 07/30/20 1645 07/31/20 0500 08/01/20 0044  NA 131* 129*  129* 132* 139  K 4.5 4.2  4.3 3.9 4.0  CL 88* 86* 89* 92*  CO2 32  --  34* 35*  GLUCOSE 124* 124* 93 94  BUN 14 17 13 10   CREATININE 0.75 0.80 0.70 0.57*  CALCIUM 8.4*  --  8.5* 8.5*  MG  --   --  1.8 1.7  PHOS  --   --  3.7  --    GFR: Estimated Creatinine Clearance: 138.4 mL/min (Villegas) (by C-G formula based on SCr of 0.57 mg/dL (L)). Liver Function Tests: Recent Labs  Lab 07/30/20 1627 07/31/20 0500 08/01/20 0044  AST 25 19 20   ALT 29 24 25   ALKPHOS 104 91 87  BILITOT 0.8 1.0 0.9  PROT 6.2* 5.3* 5.4*  ALBUMIN 3.1* 2.6* 2.7*   No results for input(s): LIPASE, AMYLASE in the last 168 hours. No results for input(s): AMMONIA in the last 168 hours. Coagulation Profile: Recent Labs  Lab 07/30/20 1627 07/31/20 0500 08/01/20 0044  INR 1.7* 1.7* 1.6*   Cardiac Enzymes: No results for input(s): CKTOTAL, CKMB, CKMBINDEX, TROPONINI in the last 168 hours. BNP (last 3 results) No results for input(s): PROBNP in the last 8760 hours. HbA1C: No results for input(s): HGBA1C in the last 72 hours. CBG: Recent Labs  Lab 07/31/20 2203  GLUCAP 106*   Lipid Profile: Recent Labs    07/30/20 1627  TRIG 91   Thyroid Function Tests: Recent Labs    07/31/20 0500  TSH 0.459   Anemia Panel: Recent Labs    07/30/20 1627  FERRITIN 57   Sepsis Labs: Recent Labs  Lab 07/30/20 1627  PROCALCITON <0.10  LATICACIDVEN 1.0    Recent Results (from the past 240 hour(s))  Resp Panel by  RT-PCR (Flu Villegas&B, Covid) Nasopharyngeal Swab     Status: None   Collection Time: 07/30/20  4:27 PM   Specimen: Nasopharyngeal Swab; Nasopharyngeal(NP) swabs in vial transport medium  Result Value Ref Range Status   SARS Coronavirus 2 by RT PCR NEGATIVE NEGATIVE Final    Comment: (NOTE) SARS-CoV-2 target nucleic acids are NOT DETECTED.  The SARS-CoV-2 RNA is generally detectable in upper respiratory specimens during the acute phase of infection. The lowest concentration of SARS-CoV-2 viral copies this assay can detect is 138 copies/mL. Villegas negative result does not preclude SARS-Cov-2 infection and should not be used as the sole basis for treatment or other patient management decisions. Villegas negative result may occur with  improper specimen collection/handling, submission of specimen other than nasopharyngeal swab, presence of viral mutation(s) within the areas targeted  by this assay, and inadequate number of viral copies(<138 copies/mL). Villegas negative result must be combined with clinical observations, patient history, and epidemiological information. The expected result is Negative.  Fact Sheet for Patients:  BloggerCourse.com  Fact Sheet for Healthcare Providers:  SeriousBroker.it  This test is no t yet approved or cleared by the [place] FDA and  has been authorized for detection and/or diagnosis of SARS-CoV-2 by FDA under an Emergency Use Authorization (EUA). This EUA will remain  in effect (meaning this test can be used) for the duration of the COVID-19 declaration under Section 564(b)(1) of the Act, 21 U.S.C.section 360bbb-3(b)(1), unless the authorization is terminated  or revoked sooner.       Influenza Villegas by PCR NEGATIVE NEGATIVE Final   Influenza B by PCR NEGATIVE NEGATIVE Final    Comment: (NOTE) The Xpert Xpress SARS-CoV-2/FLU/RSV plus assay is intended as an aid in the diagnosis of influenza from Nasopharyngeal swab  specimens and should not be used as Villegas sole basis for treatment. Nasal washings and aspirates are unacceptable for Xpert Xpress SARS-CoV-2/FLU/RSV testing.  Fact Sheet for Patients: BloggerCourse.com  Fact Sheet for Healthcare Providers: SeriousBroker.it  This test is not yet approved or cleared by the [place] FDA and has been authorized for detection and/or diagnosis of SARS-CoV-2 by FDA under an Emergency Use Authorization (EUA). This EUA will remain in effect (meaning this test can be used) for the duration of the COVID-19 declaration under Section 564(b)(1) of the Act, 21 U.S.C. section 360bbb-3(b)(1), unless the authorization is terminated or revoked.  Performed at [facility], [address], [place], [place] [address]   Blood Culture (routine x 2)     Status: None (Preliminary result)   Collection Time: 07/30/20  4:27 PM   Specimen: BLOOD  Result Value Ref Range Status   Specimen Description BLOOD LEFT ANTECUBITAL  Final   Special Requests   Final    BOTTLES DRAWN AEROBIC AND ANAEROBIC Blood Culture adequate volume   Culture   Final    NO GROWTH 2 DAYS Performed at [facility], [address], [place], [place] [address]    Report Status PENDING  Incomplete  Blood Culture (routine x 2)     Status: None (Preliminary result)   Collection Time: 07/30/20  4:27 PM   Specimen: BLOOD LEFT HAND  Result Value Ref Range Status   Specimen Description BLOOD LEFT HAND  Final   Special Requests   Final    BOTTLES DRAWN AEROBIC AND ANAEROBIC Blood Culture results may not be optimal due to an inadequate volume of blood received in culture bottles   Culture   Final    NO GROWTH 2 DAYS Performed at [facility], [address], [place], [place] [address]    Report Status PENDING  Incomplete  MRSA PCR Screening     Status: None   Collection Time: 07/31/20 10:39 PM   Specimen: Nasal Mucosa; Nasopharyngeal   Result Value Ref Range Status   MRSA by PCR NEGATIVE NEGATIVE Final    Comment:        The GeneXpert MRSA Assay (FDA approved for NASAL specimens only), is one component of Villegas comprehensive MRSA colonization surveillance program. It is not intended to diagnose MRSA infection nor to guide or monitor treatment for MRSA infections. Performed at [facility], [address], [place], [place] [address]          Radiology Studies: [facility] Chest [place] 1  View  Result Date: 07/30/2020 CLINICAL DATA:  Shortness of breath EXAM: PORTABLE CHEST 1 VIEW COMPARISON:  02/20/2020 FINDINGS: Cardiomegaly. Unchanged prominence of the pulmonary vasculature and mild, diffuse interstitial opacity. The visualized skeletal structures are unremarkable. IMPRESSION: Cardiomegaly with unchanged prominence of the pulmonary vasculature and mild, diffuse interstitial opacity, most consistent with edema. No focal airspace opacity. Electronically Signed   By: Lauralyn Primes M.D.   On: 07/30/2020 16:35   ECHOCARDIOGRAM COMPLETE  Result Date: 07/31/2020    ECHOCARDIOGRAM REPORT   Patient Name:   James Villegas Date of Exam: 07/31/2020 Medical Rec #:  [id number]      Height:       73.0 in Accession #:    [id number]     Weight:       298.9 lb Date of Birth:  [birth year]     BSA:          2.552 m Patient Age:    64 years       BP:           112/88 mmHg Patient Gender: M              HR:           81 bpm. Exam Location:  Inpatient Procedure: 2D Echo, Cardiac Doppler, Color Doppler and Intracardiac            Opacification Agent Indications:    I50.40* Unspecified combined systolic (congestive) and diastolic                 (congestive) heart failure  History:        Patient has prior history of Echocardiogram examinations, most                 recent 08/24/2019. CHF, Abnormal ECG, Stroke, Arrythmias:Atrial                 Fibrillation, Signs/Symptoms:Altered Mental Status; Risk                 Factors:Hypertension and Dyslipidemia.  Seizure. PFO.  Sonographer:    Sheralyn Boatman RDCS Referring Phys: [id number] ANASTASSIA DOUTOVA  Sonographer Comments: Technically difficult study due to poor echo windows and patient is morbidly obese. Image acquisition challenging due to patient body habitus. IMPRESSIONS  1. Left ventricular ejection fraction, by estimation, is 65 to 70%. The left ventricle has normal function. The left ventricle has no regional wall motion abnormalities. There is severe eccentric left ventricular hypertrophy. Left ventricular diastolic function could not be evaluated.  2. Right ventricular systolic function is normal. The right ventricular size is normal. There is moderately elevated pulmonary artery systolic pressure.  3. The mitral valve is normal in structure. Trivial mitral valve regurgitation. No evidence of mitral stenosis.  4. The aortic valve is grossly normal. Aortic valve regurgitation is trivial. No aortic stenosis is present.  5. Aortic dilatation noted. There is moderate dilatation of the ascending aorta, measuring 47 mm.  6. The inferior vena cava is dilated in size with <50% respiratory variability, suggesting right atrial pressure of 15 mmHg. Comparison(s): No significant change from prior study. FINDINGS  Left Ventricle: Left ventricular ejection fraction, by estimation, is 65 to 70%. The left ventricle has normal function. The left ventricle has no regional wall motion abnormalities. Definity contrast agent was given IV to delineate the left ventricular  endocardial borders. The left ventricular internal cavity size was normal in size. There is severe eccentric left ventricular hypertrophy. Left ventricular diastolic function could  not be evaluated due to atrial fibrillation. Left ventricular diastolic function could not be evaluated. Right Ventricle: The right ventricular size is normal. Right vetricular wall thickness was not well visualized. Right ventricular systolic function is normal. There is moderately elevated  pulmonary artery systolic pressure. The tricuspid regurgitant velocity is 2.91 m/s, and with an assumed right atrial pressure of 15 mmHg, the estimated right ventricular systolic pressure is 48.9 mmHg. Left Atrium: Left atrial size was normal in size. Right Atrium: Right atrial size was normal in size. Pericardium: There is no evidence of pericardial effusion. Mitral Valve: The mitral valve is normal in structure. Trivial mitral valve regurgitation. No evidence of mitral valve stenosis. Tricuspid Valve: The tricuspid valve is normal in structure. Tricuspid valve regurgitation is mild . No evidence of tricuspid stenosis. Aortic Valve: The aortic valve is grossly normal. Aortic valve regurgitation is trivial. No aortic stenosis is present. Pulmonic Valve: The pulmonic valve was not well visualized. Pulmonic valve regurgitation is not visualized. Aorta: Aortic dilatation noted. There is moderate dilatation of the ascending aorta, measuring 47 mm. Venous: The inferior vena cava is dilated in size with less than 50% respiratory variability, suggesting right atrial pressure of 15 mmHg. IAS/Shunts: The atrial septum is grossly normal.  LEFT VENTRICLE PLAX 2D LVIDd:         4.10 cm LVIDs:         2.90 cm LV PW:         1.90 cm LV IVS:        2.10 cm LVOT diam:     1.90 cm LV SV:         56 LV SV Index:   22 LVOT Area:     2.84 cm  LV Volumes (MOD) LV vol d, MOD A2C: 69.9 ml LV vol d, MOD A4C: 68.0 ml LV vol s, MOD A2C: 15.4 ml LV vol s, MOD A4C: 22.2 ml LV SV MOD A2C:     54.5 ml LV SV MOD A4C:     68.0 ml LV SV MOD BP:      48.6 ml RIGHT VENTRICLE            IVC RV S prime:     8.73 cm/s  IVC diam: 2.80 cm TAPSE (M-mode): 2.1 cm LEFT ATRIUM             Index       RIGHT ATRIUM           Index LA diam:        3.60 cm 1.41 cm/m  RA Area:     15.60 cm LA Vol (A2C):   40.4 ml 15.83 ml/m RA Volume:   44.70 ml  17.51 ml/m LA Vol (A4C):   45.2 ml 17.71 ml/m LA Biplane Vol: 47.1 ml 18.45 ml/m  AORTIC VALVE LVOT Vmax:    123.00 cm/s LVOT Vmean:  86.100 cm/s LVOT VTI:    0.196 m  AORTA Ao Root diam: 4.10 cm Ao Asc diam:  4.40 cm MITRAL VALVE               TRICUSPID VALVE MV Area (PHT): 4.12 cm    TR Peak grad:   33.9 mmHg MV Decel Time: 184 msec    TR Vmax:        291.00 cm/s MV E velocity: 73.40 cm/s  SHUNTS                            Systemic VTI:  0.20 m                            Systemic Diam: 1.90 cm Jodelle Red MD Electronically signed by Jodelle Red MD Signature Date/Time: 07/31/2020/3:59:17 PM    Final         Scheduled Meds: . Chlorhexidine Gluconate Cloth  6 each Topical Daily  . cycloSPORINE  1 drop Both Eyes BID  . ezetimibe  10 mg Oral Daily  . furosemide  40 mg Intravenous Q12H  . metoprolol tartrate  12.5 mg Oral BID  . pantoprazole  40 mg Oral Daily  . phenytoin  200 mg Oral BID WC  . rosuvastatin  40 mg Oral QHS  . senna  1 tablet Oral BID  . sodium chloride flush  3 mL Intravenous Q12H  . Warfarin - Pharmacist Dosing Inpatient   Does not apply q1600   Continuous Infusions: . sodium chloride            James Lloyd, MD [facility] 08/01/2020, 7:13 AM

## 2020-08-01 NOTE — Progress Notes (Signed)
ANTICOAGULATION CONSULT NOTE   Pharmacy Consult for Warfarin Indication: atrial fibrillation  Allergies  Allergen Reactions  . Fenofibrate Other (See Comments)    Per [facility]    Patient Measurements: Height: 6\' 1"  (185.4 cm) Weight: 135.6 kg (298 lb 15.1 oz) IBW/kg (Calculated) : 79.9  Vital Signs: Temp: 98.2 F (36.8 C) (02/23 0008) Temp Source: Oral (02/23 0008) BP: 111/69 (02/23 0008) Pulse Rate: 81 (02/23 0008)  Labs: Recent Labs    07/30/20 1627 07/30/20 1645 07/30/20 1821 07/31/20 0100 07/31/20 0500 08/01/20 0044  HGB 15.1 16.3  16.7  --   --  14.1 14.5  HCT 47.8 48.0  49.0  --   --  45.0 44.1  PLT 279  --   --   --  207 229  LABPROT 19.0*  --   --   --  19.3* 18.7*  INR 1.7*  --   --   --  1.7* 1.6*  CREATININE 0.75 0.80  --   --  0.70 0.57*  TROPONINIHS 76*  --  83* 94*  --   --     Estimated Creatinine Clearance: 138.4 mL/min (A) (by C-G formula based on SCr of 0.57 mg/dL (L)).   Medical History: Past Medical History:  Diagnosis Date  . Acute on chronic congestive heart failure (HCC)   . Anemia, secondary    SECONDARY TO ACUTE BLOOD LOSS  . Anxiety disorder   . Bloody stool 08/29/2011   intermittent along with constipation.   . 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 Depression   . 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 Dysphagia   . Hyperlipidemia   . Hypertension   . Lupus anticoagulant disorder (HCC) 1990  . Morbid obesity (HCC)   . PFO (patent foramen ovale)    TEE 2/10: EF 60%, trivial AI, mild Ao root dilatation, mod PFO with R-L shunting, atrial septal aneurysm;   echo 7/12: EF 65-70%, grade 1 diast dysfxn, mild MR, LVOT showed severe obstruction  . Protein C deficiency (HCC)   . Protein S deficiency (HCC)   . Rectus sheath hematoma 7/12   required reversal of anticoagulation and c/b DVT req. IVC filter  . Seizure disorder (HCC)      Medications:  Scheduled:  . Chlorhexidine Gluconate Cloth  6 each Topical Daily  . cycloSPORINE  1 drop Both Eyes BID  . ezetimibe  10 mg Oral Daily  . furosemide  40 mg Intravenous Q12H  . metoprolol tartrate  12.5 mg Oral BID  . pantoprazole  40 mg Oral Daily  . phenytoin  200 mg Oral BID WC  . rosuvastatin  40 mg Oral QHS  . senna  1 tablet Oral BID  . sodium chloride flush  3 mL Intravenous Q12H  . Warfarin - Pharmacist Dosing Inpatient   Does not apply q1600    Assessment: 79 yom admitted for AMS, on warfarin PTA for afib with last dose appearing to be 2/19 PTA. The nursing home [facility] is difficult to read with multiple changes in warfarin order. Most recent PTA regimen appears to be warfarin 3mg  daily. INR subtherapeutic at 1.7 on admit. Patient continues on phenytoin as PTA for hx of seizures.  INR today down to 1.6 after boosted warfarin dose. CBC stable.  Goal of Therapy:  INR 2-3 Monitor platelets by anticoagulation protocol: Yes   Plan:  Warfarin 5mg  PO x1 again tonight Daily protime   , PharmD, BCPS, [facility] Clinical Pharmacist [phone] Please check AMION for all [facility] Pharmacy numbers 08/01/2020

## 2020-08-01 NOTE — Plan of Care (Signed)

## 2020-08-01 NOTE — [place] FL2 (Signed)
[place] MEDICAID FL2 LEVEL OF CARE SCREENING TOOL     IDENTIFICATION  Patient Name: James Villegas Birthdate: Jul 01, 1955 Sex: male Admission Date (Current Location): 07/30/2020  [facility] and [place] Number:  Producer, television/film/video and Address:  [facility], [address], [place], [place] [address]      Provider Number: [id number]  Attending Physician Name and Address:  Glade Lloyd, MD  Relative Name and Phone Number:  James, Villegas (Sister)   [phone] [facility])    Current Level of Care: Hospital Recommended Level of Care: Skilled Nursing Facility Prior Approval Number:    Date Approved/Denied:   PASRR Number:    Discharge Plan: SNF    Current Diagnoses: Patient Active Problem List   Diagnosis Date Noted  . Aneurysm of ascending aorta (HCC)   . Hypoxia   . CHF exacerbation (HCC) 07/30/2020  . Acute on chronic diastolic CHF (congestive heart failure) (HCC) 07/30/2020  . Acute on chronic respiratory failure with hypercapnia (HCC) 07/30/2020  . Elevated troponin 07/30/2020  . Pressure injury of skin 02/22/2020  . AKI (acute kidney injury) (HCC)   . HCAP (healthcare-associated pneumonia)   . Acute metabolic encephalopathy   . Hypercoagulable state (HCC)   . Seizure disorder (HCC)   . Chronic diastolic CHF (congestive heart failure) (HCC)   . Atrial fibrillation, chronic (HCC)   . History of CVA (cerebrovascular accident)   . Hyperlipidemia   . Acute respiratory failure (HCC) 02/20/2020  . CAP (community acquired pneumonia) 02/20/2020  . Obesity, Class III, BMI 40-49.9 (morbid obesity) (HCC) 02/20/2020  . Bradycardia 02/20/2020  . Hyperkalemia 02/20/2020  . Hypercapnic respiratory failure (HCC) 02/20/2020  . Acute on chronic congestive heart failure (HCC)   . New onset atrial fibrillation (HCC)   . Chronic anticoagulation   . Respiratory failure (HCC) 08/02/2018  . Anemia of other chronic disease 11/23/2013  . Pain in joint, ankle  and foot 11/14/2013  . Other convulsions 10/19/2013  . Noninfectious gastroenteritis and colitis 07/29/2013  . Left upper quadrant abdominal mass 07/28/2013  . Other specified disease of white blood cells 07/16/2013  . Ingrowing toenail with infection 05/20/2013  . Late effects of cerebrovascular disease 10/15/2012  . Pure hypercholesterolemia 10/15/2012  . Essential hypertension, benign 10/15/2012  . Hereditary and idiopathic peripheral neuropathy 10/15/2012  . 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 Cerebral infarction (HCC) 08/29/2011  . DVT (deep venous thrombosis) (HCC) 08/29/2011  . Bloody stool 08/29/2011  . PFO (patent foramen ovale) 02/18/2011    Orientation RESPIRATION BLADDER Height & Weight     Self,Time,Situation,Place  O2 (4L 02) Indwelling catheter Weight: 298 lb 15.1 oz (135.6 kg) Height:  6\' 1"  (185.4 cm)  BEHAVIORAL SYMPTOMS/MOOD NEUROLOGICAL BOWEL NUTRITION STATUS      Incontinent Diet (see d/c summary)  AMBULATORY STATUS COMMUNICATION OF NEEDS Skin   Extensive Assist Verbally Other (Comment) (Left  buttocks; sacrum upper; Non pressure wound right abdomen)                       Personal Care Assistance Level of Assistance  Bathing,Feeding,Dressing Bathing Assistance: Maximum assistance Feeding assistance: Independent Dressing Assistance: Maximum assistance     Functional Limitations Info  Sight,Hearing,Speech Sight Info: Adequate Hearing Info: Adequate Speech Info: Adequate    SPECIAL CARE FACTORS FREQUENCY  Contractures Contractures Info: Not present    Additional Factors Info  Code Status,Allergies Code Status Info: DNR Allergies Info: Fenofibrate           Current Medications (08/01/2020):  This is the current hospital active medication list Current Facility-Administered Medications  Medication Dose Route Frequency Provider Last Rate Last Admin  . 0.9 %  sodium  chloride infusion  250 mL Intravenous PRN Therisa Doyne, MD      . acetaminophen (TYLENOL) tablet 650 mg  650 mg Oral Q6H PRN Doutova, Anastassia, MD       Or  . acetaminophen (TYLENOL) suppository 650 mg  650 mg Rectal Q6H PRN Doutova, Anastassia, MD      . bisacodyl (DULCOLAX) suppository 10 mg  10 mg Rectal Daily PRN Doutova, Anastassia, MD      . Chlorhexidine Gluconate Cloth 2 % PADS 6 each  6 each Topical Daily Therisa Doyne, MD   6 each at 07/31/20 2209  . cycloSPORINE (RESTASIS) 0.05 % ophthalmic emulsion 1 drop  1 drop Both Eyes BID Therisa Doyne, MD   1 drop at 08/01/20 0851  . ezetimibe (ZETIA) tablet 10 mg  10 mg Oral Daily Doutova, Anastassia, MD   10 mg at 08/01/20 0852  . furosemide (LASIX) injection 40 mg  40 mg Intravenous Q12H Doutova, Anastassia, MD   40 mg at 08/01/20 0530  . metoprolol tartrate (LOPRESSOR) tablet 12.5 mg  12.5 mg Oral BID Chilton Si, MD   12.5 mg at 08/01/20 4034  . pantoprazole (PROTONIX) EC tablet 40 mg  40 mg Oral Daily Therisa Doyne, MD   40 mg at 08/01/20 0852  . phenytoin (DILANTIN) ER capsule 200 mg  200 mg Oral BID WC Doutova, Anastassia, MD   200 mg at 08/01/20 0852  . polyethylene glycol (MIRALAX / GLYCOLAX) packet 17 g  17 g Oral Daily PRN Doutova, Anastassia, MD      . rosuvastatin (CRESTOR) tablet 40 mg  40 mg Oral QHS Therisa Doyne, MD   40 mg at 07/31/20 2208  . senna (SENOKOT) tablet 8.6 mg  1 tablet Oral BID Therisa Doyne, MD   8.6 mg at 07/31/20 2208  . sodium chloride flush (NS) 0.9 % injection 3 mL  3 mL Intravenous Q12H Doutova, Anastassia, MD   3 mL at 08/01/20 0859  . sodium chloride flush (NS) 0.9 % injection 3 mL  3 mL Intravenous PRN Doutova, Anastassia, MD      . warfarin (COUMADIN) tablet 5 mg  5 mg Oral ONCE-1600 Mosetta Anis, RPH      . Warfarin - Pharmacist Dosing Inpatient   Does not apply q1600 Joaquim Lai, [facility]         Discharge Medications: Please see discharge summary for  a list of discharge medications.  Relevant Imaging Results:  Relevant Lab Results:   Additional Information SS# [id number]  [facility], LCSW

## 2020-08-01 NOTE — Evaluation (Signed)
Physical Therapy Evaluation Patient Details Name: James Villegas MRN: [id number] DOB: 1956/01/26 Today's Date: 08/01/2020   History of Present Illness  65 y.o. male with medical history significant of CVA with right side weakness, a.fib on coumadin, HTN, obesity, bradycardia chronic, hypercapnic respiratory failure chronic, seizure disorder, chronic diastolic CHF, history of stroke, HLD,  Clinical Impression  PTA pt resident of SNF after CVA. At facility, pt requires total A for hoyer lift to his power chair for mobility. Pt reports being at his baseline level of function. Able to roll R with min guard and roll L with total A. Pt requires total Ax2 for use of hoyer to get to recliner. PT recommends pt return to his facility at discharge. Given pt is at baseline PT signing off at this time. RN educated on need for hoyer lift to chair daily. If pt experiences change in status, reorder PT services.     Follow Up Recommendations SNF    Equipment Recommendations  None recommended by PT (has power chair at facility)       Precautions / Restrictions Precautions Precautions: Fall Precaution Comments: R side is flaccid (previous CVA) Restrictions Weight Bearing Restrictions: No      Mobility  Bed Mobility Overal bed mobility: Needs Assistance Bed Mobility: Rolling Rolling: Min guard;Total assist (min guard to right, total A to left)         General bed mobility comments: rolling for lift pad placement    Transfers Overall transfer level: Needs assistance               General transfer comment: Pt is hoyer lift at baseline. RUE needs to be placed in safe position for patient, and RLE needs to be held for transfer        Balance Overall balance assessment: Needs assistance Sitting-balance support: Feet supported;Bilateral upper extremity supported Sitting balance-Leahy Scale: Zero Sitting balance - Comments: requires support of recliner to maintain balance.                                      Pertinent Vitals/Pain Pain Assessment: Faces Faces Pain Scale: No hurt Pain Location: discomfort with use of lift sling Pain Intervention(s): Monitored during session;Repositioned    Home Living Family/patient expects to be discharged to:: Skilled nursing facility                 Additional Comments: has resided at [facility] since CVA - most recently [place]    Prior Function Level of Independence: Needs assistance   Gait / Transfers Assistance Needed: Michiel Sites to wc, manuevers wheel chair with Left side extremities.  ADL's / Homemaking Assistance Needed: Able to perform grooming and feeding with setup. Toileting, bathing and dressing at bed level.        Hand Dominance   Dominant Hand: Left    Extremity/Trunk Assessment   Upper Extremity Assessment Upper Extremity Assessment: RUE deficits/detail RUE Deficits / Details: flaccid at baseline from previous CVA, edemous throughout RUE    Lower Extremity Assessment Lower Extremity Assessment: RLE deficits/detail (BLE with edema) RLE Deficits / Details: flaccid at baseline from previous CVA - requires support during hoyer lift    Cervical / Trunk Assessment Cervical / Trunk Assessment: Other exceptions Cervical / Trunk Exceptions: excess body habitus  Communication   Communication: Expressive difficulties (extra time to formulate sentences (baseline))  Cognition Arousal/Alertness: Awake/alert Behavior During Therapy: [facility] for tasks assessed/performed  Overall Cognitive Status: Impaired/Different from baseline Area of Impairment: Orientation;Memory;Awareness;Problem solving                 Orientation Level: Disoriented to;Situation;Time ("I've had a stroke" and couldn't identify day of the week)   Memory: Decreased short-term memory     Awareness: Intellectual Problem Solving: Decreased initiation;Difficulty sequencing;Requires verbal cues;Requires tactile cues General  Comments: Pt very pleasant overall and is oriented x3, pereverates on topics but easily re-directed      General Comments General comments (skin integrity, edema, etc.): Pt on 2 L O2 via [place] on entry with SaO2 92%O2, pt reports he does not need O2 at baseline, removed during lift to chair and SaO2 dropped to 82%O2, Pony replaced and SaO2 quickly rebounded to 90%O2        Assessment/Plan    PT Assessment Patent does not need any further PT services         PT Goals (Current goals can be found in the Care Plan section)  Acute Rehab PT Goals Patient Stated Goal: get off Oxygen PT Goal Formulation: With patient Time For Goal Achievement: 08/15/20    Frequency     Barriers to discharge        Co-evaluation PT/OT/SLP Co-Evaluation/Treatment: Yes Reason for Co-Treatment: Necessary to address cognition/behavior during functional activity PT goals addressed during session: Proper use of DME OT goals addressed during session: ADL's and self-care       AM-PAC PT "6 Clicks" Mobility  Outcome Measure Help needed turning from your back to your side while in a flat bed without using bedrails?: Total Help needed moving from lying on your back to sitting on the side of a flat bed without using bedrails?: Total Help needed moving to and from a bed to a chair (including a wheelchair)?: Total Help needed standing up from a chair using your arms (e.g., wheelchair or bedside chair)?: Total Help needed to walk in hospital room?: Total Help needed climbing 3-5 steps with a railing? : Total 6 Click Score: 6    End of Session Equipment Utilized During Treatment: Oxygen;Other (comment) (Geomat placed in recliner for improved weight shift to change pressure distribution) Activity Tolerance: Patient tolerated treatment well Patient left: in chair;with call bell/phone within reach;with chair alarm set Nurse Communication: Mobility status;Need for lift equipment PT Visit Diagnosis: Other  abnormalities of gait and mobility (R26.89);Hemiplegia and hemiparesis    Time: 1010-1046 PT Time Calculation (min) (ACUTE ONLY): 36 min   Charges:   PT Evaluation $PT Eval Moderate Complexity: 1 Mod          Nikeia Henkes B. Beverely Risen PT, DPT Acute Rehabilitation Services Pager [phone] Office [phone]   Elon Alas Fleet 08/01/2020, 2:02 PM

## 2020-08-02 DIAGNOSIS — J9622 Acute and chronic respiratory failure with hypercapnia: Secondary | ICD-10-CM | POA: Diagnosis not present

## 2020-08-02 DIAGNOSIS — I5033 Acute on chronic diastolic (congestive) heart failure: Secondary | ICD-10-CM | POA: Diagnosis not present

## 2020-08-02 LAB — CBC WITH DIFFERENTIAL/PLATELET
Abs Immature Granulocytes: 0.08 10*3/uL — ABNORMAL HIGH (ref 0.00–0.07)
Basophils Absolute: 0 10*3/uL (ref 0.0–0.1)
Basophils Relative: 0 %
Eosinophils Absolute: 0.7 10*3/uL — ABNORMAL HIGH (ref 0.0–0.5)
Eosinophils Relative: 6 %
HCT: 47 % (ref 39.0–52.0)
Hemoglobin: 14.8 g/dL (ref 13.0–17.0)
Immature Granulocytes: 1 %
Lymphocytes Relative: 9 %
Lymphs Abs: 1 10*3/uL (ref 0.7–4.0)
MCH: 30.1 pg (ref 26.0–34.0)
MCHC: 31.5 g/dL (ref 30.0–36.0)
MCV: 95.5 fL (ref 80.0–100.0)
Monocytes Absolute: 1.1 10*3/uL — ABNORMAL HIGH (ref 0.1–1.0)
Monocytes Relative: 10 %
Neutro Abs: 8.7 10*3/uL — ABNORMAL HIGH (ref 1.7–7.7)
Neutrophils Relative %: 74 %
Platelets: 254 10*3/uL (ref 150–400)
RBC: 4.92 MIL/uL (ref 4.22–5.81)
RDW: 14.2 % (ref 11.5–15.5)
WBC: 11.6 10*3/uL — ABNORMAL HIGH (ref 4.0–10.5)
nRBC: 0 % (ref 0.0–0.2)

## 2020-08-02 LAB — BASIC METABOLIC PANEL
Anion gap: 10 (ref 5–15)
BUN: 10 mg/dL (ref 8–23)
CO2: 39 mmol/L — ABNORMAL HIGH (ref 22–32)
Calcium: 8.9 mg/dL (ref 8.9–10.3)
Chloride: 90 mmol/L — ABNORMAL LOW (ref 98–111)
Creatinine, Ser: 0.65 mg/dL (ref 0.61–1.24)
GFR, Estimated: >60 mL/min (ref >60)
Glucose, Bld: 92 mg/dL (ref 70–99)
Potassium: 3.5 mmol/L (ref 3.5–5.1)
Sodium: 139 mmol/L (ref 135–145)

## 2020-08-02 LAB — PROTIME-INR
INR: 1.4 — ABNORMAL HIGH (ref 0.8–1.2)
Prothrombin Time: 16.9 seconds — ABNORMAL HIGH (ref 11.4–15.2)

## 2020-08-02 LAB — MAGNESIUM: Magnesium: 1.9 mg/dL (ref 1.7–2.4)

## 2020-08-02 MED ORDER — WARFARIN SODIUM 5 MG PO TABS
5.0000 mg | ORAL_TABLET | Freq: Once | ORAL | Status: AC
  Administered 2020-08-02: 5 mg via ORAL
  Filled 2020-08-02: qty 1

## 2020-08-02 MED ORDER — POTASSIUM CHLORIDE CRYS ER 20 MEQ PO TBCR
40.0000 meq | EXTENDED_RELEASE_TABLET | Freq: Every day | ORAL | Status: DC
  Administered 2020-08-02 – 2020-08-07 (×6): 40 meq via ORAL
  Filled 2020-08-02 (×6): qty 2

## 2020-08-02 MED ORDER — MAGNESIUM OXIDE 400 (241.3 MG) MG PO TABS
400.0000 mg | ORAL_TABLET | Freq: Every day | ORAL | Status: DC
  Administered 2020-08-02 – 2020-08-07 (×6): 400 mg via ORAL
  Filled 2020-08-02 (×6): qty 1

## 2020-08-02 NOTE — Progress Notes (Signed)
Progress Note  Patient Name: James Villegas Date of Encounter: 08/02/2020  [facility] Cardiologist: James Alexander, MD   Subjective   Feeling well.  Has not noted any improvement in his breathing.  Inpatient Medications    Scheduled Meds: . Chlorhexidine Gluconate Cloth  6 each Topical Daily  . cycloSPORINE  1 drop Both Eyes BID  . ezetimibe  10 mg Oral Daily  . feeding supplement  237 mL Oral Q24H  . furosemide  40 mg Intravenous Q12H  . metoprolol tartrate  12.5 mg Oral BID  . multivitamin with minerals  1 tablet Oral Daily  . pantoprazole  40 mg Oral Daily  . phenytoin  200 mg Oral BID WC  . rosuvastatin  40 mg Oral QHS  . senna  1 tablet Oral BID  . sodium chloride flush  3 mL Intravenous Q12H  . warfarin  5 mg Oral ONCE-1600  . Warfarin - Pharmacist Dosing Inpatient   Does not apply q1600   Continuous Infusions: . sodium chloride     PRN Meds: sodium chloride, acetaminophen **OR** acetaminophen, bisacodyl, polyethylene glycol, sodium chloride flush   Vital Signs    Vitals:   08/01/20 2324 08/02/20 0339 08/02/20 0417 08/02/20 0725  BP: (!) 107/94 108/67  122/62  Pulse: 81 87  80  Resp: 17 16  20   Temp: 98.5 F (36.9 C) 98.9 F (37.2 C)  98.3 F (36.8 C)  TempSrc: Oral Oral  Oral  SpO2: 91% 97%  94%  Weight:   (!) 141 kg   Height:        Intake/Output Summary (Last 24 hours) at 08/02/2020 0934 Last data filed at 08/02/2020 08/04/2020 Gross per 24 hour  Intake 950 ml  Output 5625 ml  Net -4675 ml   Last 3 Weights 08/02/2020 07/30/2020 02/27/2020  Weight (lbs) 310 lb 13.6 oz 298 lb 15.1 oz 299 lb 9.6 oz  Weight (kg) 141 kg 135.6 kg 135.898 kg      Telemetry    Atrial fibrillation.  Rates less than 100 bpm.- Personally Reviewed  ECG    N/A- Personally Reviewed  Physical Exam   VS:  BP 122/62 (BP Location: Left Wrist)   Pulse 80   Temp 98.3 F (36.8 C) (Oral)   Resp 20   Ht 6\' 1"  (1.854 m)   Wt (!) 141 kg   SpO2 94%   BMI 41.01 kg/m  ,  BMI Body mass index is 41.01 kg/m. GENERAL: Chronically ill-appearing.  No acute distress. HEENT: Pupils equal round and reactive, fundi not visualized, oral mucosa unremarkable NECK:  + jugular venous distention, waveform within normal limits, carotid upstroke brisk and symmetric LUNGS:  Clear to auscultation bilaterally HEART:  RRR.  PMI not displaced or sustained,S1 and S2 within normal limits, no S3, no S4, no clicks, no rubs, no murmurs ABD:  Flat, positive bowel sounds normal in frequency in pitch, no bruits, no rebound, no guarding, no midline pulsatile mass, no hepatomegaly, no splenomegaly EXT:  2 plus pulses throughout, 2++ lower extremity edema, no cyanosis no clubbing SKIN:  No rashes no nodules NEURO:  Cranial nerves II through XII grossly intact, motor grossly intact throughout PSYCH:  Cognitively intact, oriented to person place and time   Labs    High Sensitivity Troponin:   Recent Labs  Lab 07/30/20 1627 07/30/20 1821 07/31/20 0100  TROPONINIHS 76* 83* 94*      Chemistry Recent Labs  Lab 07/30/20 1627 07/30/20 1645 07/31/20 0500 08/01/20  0044 08/02/20 0129  NA 131*   < > 132* 139 139  K 4.5   < > 3.9 4.0 3.5  CL 88*   < > 89* 92* 90*  CO2 32  --  34* 35* 39*  GLUCOSE 124*   < > 93 94 92  BUN 14   < > 13 10 10   CREATININE 0.75   < > 0.70 0.57* 0.65  CALCIUM 8.4*  --  8.5* 8.5* 8.9  PROT 6.2*  --  5.3* 5.4*  --   ALBUMIN 3.1*  --  2.6* 2.7*  --   AST 25  --  19 20  --   ALT 29  --  24 25  --   ALKPHOS 104  --  91 87  --   BILITOT 0.8  --  1.0 0.9  --   GFRNONAA >60  --  >60 >60 >60  ANIONGAP 11  --  9 12 10    < > = values in this interval not displayed.     Hematology Recent Labs  Lab 07/31/20 0500 08/01/20 0044 08/02/20 0129  WBC 8.2 8.9 11.6*  RBC 4.65 4.66 4.92  HGB 14.1 14.5 14.8  HCT 45.0 44.1 47.0  MCV 96.8 94.6 95.5  MCH 30.3 31.1 30.1  MCHC 31.3 32.9 31.5  RDW 14.3 14.3 14.2  PLT 207 229 254    BNP Recent Labs  Lab  07/30/20 1627  BNP 960.3*     DDimer  Recent Labs  Lab 07/30/20 1627  DDIMER 0.57*     Radiology    ECHOCARDIOGRAM COMPLETE  Result Date: 07/31/2020    ECHOCARDIOGRAM REPORT   Patient Name:   James Villegas Radi Date of Exam: 07/31/2020 Medical Rec #:  08/02/2020      Height:       73.0 in Accession #:    08/02/2020     Weight:       298.9 lb Date of Birth:  Nov 30, 1955     BSA:          2.552 m Patient Age:    64 years       BP:           112/88 mmHg Patient Gender: M              HR:           81 bpm. Exam Location:  Inpatient Procedure: 2D Echo, Cardiac Doppler, Color Doppler and Intracardiac            Opacification Agent Indications:    I50.40* Unspecified combined systolic (congestive) and diastolic                 (congestive) heart failure  History:        Patient has prior history of Echocardiogram examinations, most                 recent 08/24/2019. CHF, Abnormal ECG, Stroke, Arrythmias:Atrial                 Fibrillation, Signs/Symptoms:Altered Mental Status; Risk                 Factors:Hypertension and Dyslipidemia. Seizure. PFO.  Sonographer:    12-13-1993 RDCS Referring Phys: [id number] James Villegas  Sonographer Comments: Technically difficult study due to poor echo windows and patient is morbidly obese. Image acquisition challenging due to patient body habitus. IMPRESSIONS  1. Left ventricular ejection fraction, by estimation, is 65 to 70%. The  left ventricle has normal function. The left ventricle has no regional wall motion abnormalities. There is severe eccentric left ventricular hypertrophy. Left ventricular diastolic function could not be evaluated.  2. Right ventricular systolic function is normal. The right ventricular size is normal. There is moderately elevated pulmonary artery systolic pressure.  3. The mitral valve is normal in structure. Trivial mitral valve regurgitation. No evidence of mitral stenosis.  4. The aortic valve is grossly normal. Aortic valve regurgitation is  trivial. No aortic stenosis is present.  5. Aortic dilatation noted. There is moderate dilatation of the ascending aorta, measuring 47 mm.  6. The inferior vena cava is dilated in size with <50% respiratory variability, suggesting right atrial pressure of 15 mmHg. Comparison(s): No significant change from prior study. FINDINGS  Left Ventricle: Left ventricular ejection fraction, by estimation, is 65 to 70%. The left ventricle has normal function. The left ventricle has no regional wall motion abnormalities. Definity contrast agent was given IV to delineate the left ventricular  endocardial borders. The left ventricular internal cavity size was normal in size. There is severe eccentric left ventricular hypertrophy. Left ventricular diastolic function could not be evaluated due to atrial fibrillation. Left ventricular diastolic function could not be evaluated. Right Ventricle: The right ventricular size is normal. Right vetricular wall thickness was not well visualized. Right ventricular systolic function is normal. There is moderately elevated pulmonary artery systolic pressure. The tricuspid regurgitant velocity is 2.91 m/s, and with an assumed right atrial pressure of 15 mmHg, the estimated right ventricular systolic pressure is 48.9 mmHg. Left Atrium: Left atrial size was normal in size. Right Atrium: Right atrial size was normal in size. Pericardium: There is no evidence of pericardial effusion. Mitral Valve: The mitral valve is normal in structure. Trivial mitral valve regurgitation. No evidence of mitral valve stenosis. Tricuspid Valve: The tricuspid valve is normal in structure. Tricuspid valve regurgitation is mild . No evidence of tricuspid stenosis. Aortic Valve: The aortic valve is grossly normal. Aortic valve regurgitation is trivial. No aortic stenosis is present. Pulmonic Valve: The pulmonic valve was not well visualized. Pulmonic valve regurgitation is not visualized. Aorta: Aortic dilatation noted.  There is moderate dilatation of the ascending aorta, measuring 47 mm. Venous: The inferior vena cava is dilated in size with less than 50% respiratory variability, suggesting right atrial pressure of 15 mmHg. IAS/Shunts: The atrial septum is grossly normal.  LEFT VENTRICLE PLAX 2D LVIDd:         4.10 cm LVIDs:         2.90 cm LV PW:         1.90 cm LV IVS:        2.10 cm LVOT diam:     1.90 cm LV SV:         56 LV SV Index:   22 LVOT Area:     2.84 cm  LV Volumes (MOD) LV vol d, MOD A2C: 69.9 ml LV vol d, MOD A4C: 68.0 ml LV vol s, MOD A2C: 15.4 ml LV vol s, MOD A4C: 22.2 ml LV SV MOD A2C:     54.5 ml LV SV MOD A4C:     68.0 ml LV SV MOD BP:      48.6 ml RIGHT VENTRICLE            IVC RV S prime:     8.73 cm/s  IVC diam: 2.80 cm TAPSE (M-mode): 2.1 cm LEFT ATRIUM  Index       RIGHT ATRIUM           Index LA diam:        3.60 cm 1.41 cm/m  RA Area:     15.60 cm LA Vol (A2C):   40.4 ml 15.83 ml/m RA Volume:   44.70 ml  17.51 ml/m LA Vol (A4C):   45.2 ml 17.71 ml/m LA Biplane Vol: 47.1 ml 18.45 ml/m  AORTIC VALVE LVOT Vmax:   123.00 cm/s LVOT Vmean:  86.100 cm/s LVOT VTI:    0.196 m  AORTA Ao Root diam: 4.10 cm Ao Asc diam:  4.40 cm MITRAL VALVE               TRICUSPID VALVE MV Area (PHT): 4.12 cm    TR Peak grad:   33.9 mmHg MV Decel Time: 184 msec    TR Vmax:        291.00 cm/s MV E velocity: 73.40 cm/s                            SHUNTS                            Systemic VTI:  0.20 m                            Systemic Diam: 1.90 cm Jodelle RedBridgette Christopher MD Electronically signed by Jodelle RedBridgette Christopher MD Signature Date/Time: 07/31/2020/3:59:17 PM    Final     Cardiac Studies   Echo 07/31/20: 1. Left ventricular ejection fraction, by estimation, is 65 to 70%. The  left ventricle has normal function. The left ventricle has no regional  wall motion abnormalities. There is severe eccentric left ventricular  hypertrophy. Left ventricular diastolic  function could not be evaluated.  2. Right  ventricular systolic function is normal. The right ventricular  size is normal. There is moderately elevated pulmonary artery systolic  pressure.  3. The mitral valve is normal in structure. Trivial mitral valve  regurgitation. No evidence of mitral stenosis.  4. The aortic valve is grossly normal. Aortic valve regurgitation is  trivial. No aortic stenosis is present.  5. Aortic dilatation noted. There is moderate dilatation of the ascending  aorta, measuring 47 mm.  6. The inferior vena cava is dilated in size with <50% respiratory  variability, suggesting right atrial pressure of 15 mmHg.   Patient Profile     Mr. Lorella NimrodHarvey is a 65 year old man with prior stroke with residual right hemiparesis, wheelchair-bound nursing home resident, rectus sheath hematoma, coagulopathy, persistent atrial fibrillation on warfarin, ascending aortic aneurysm, hypertension, morbid obesity with obesity hypoventilation syndrome, and ascending aortic aneurysm admitted with hypoxic and hypercarbic respiratory failure in the setting of acute on chronic diastolic heart failure and obesity hypoventilation syndrome.     Assessment & Plan    # Hypoxic/hypercarbic respiratory failure: # OSA:  [facility]  Query whether it is possible to get a CPAP/BiPAP with some basic settings or inpatient titration.  Understandably, this is not a typical practice.  However may be reasonable for his circumstances if possible.  # Acute on chronic diastolic heart failure:  BNP elevated to 960.  Likely underestimates his degree of volume overload 2/2 morbid obesity.  Continue diuresis with lasix 40 mg IV bid.  He was net -3.4L yesterday.  Renal  function stable.  We will also order Unna boots.  Will supplement potassium and magnesium while diuresing.  # Persistent atrial fibrillation:  Rates well-controlled.  Continue warfarin and metoprolol.  # Morbid obesity:  # Bed bound: # Prior stroke: Immobilized 2/2  morbid obesity and prior stroke with hemiparesis.  Unable to stand anymore 2/2 pain in L knee.   # Ascending aortic aneurysm: Noted to have a 4.7 cm ascending aortic aneurysm on echo yesterday.  Blood pressure control as above.  It was 4.5 cm on 08/2019.  He is scheduled for CT-A on 09/2020.     For questions or updates, please contact [facility] Please consult [email or web address] for contact info under        Signed, Chilton Si, MD  08/02/2020, 9:33 AM

## 2020-08-02 NOTE — Progress Notes (Signed)
ANTICOAGULATION CONSULT NOTE   Pharmacy Consult for Warfarin Indication: atrial fibrillation  Allergies  Allergen Reactions  . Fenofibrate Other (See Comments)    Per [facility]    Patient Measurements: Height: 6\' 1"  (185.4 cm) Weight: (!) 141 kg (310 lb 13.6 oz) IBW/kg (Calculated) : 79.9  Vital Signs: Temp: 98.9 F (37.2 C) (02/24 0339) Temp Source: Oral (02/24 0339) BP: 108/67 (02/24 0339) Pulse Rate: 87 (02/24 0339)  Labs: Recent Labs    07/30/20 1627 07/30/20 1645 07/30/20 1821 07/31/20 0100 07/31/20 0500 08/01/20 0044 08/02/20 0129  HGB 15.1   < >  --   --  14.1 14.5 14.8  HCT 47.8   < >  --   --  45.0 44.1 47.0  PLT 279  --   --   --  207 229 254  LABPROT 19.0*  --   --   --  19.3* 18.7* 16.9*  INR 1.7*  --   --   --  1.7* 1.6* 1.4*  CREATININE 0.75   < >  --   --  0.70 0.57* 0.65  TROPONINIHS 76*  --  83* 94*  --   --   --    < > = values in this interval not displayed.    Estimated Creatinine Clearance: 141.2 mL/min (by C-G formula based on SCr of 0.65 mg/dL).   Medical History: Past Medical History:  Diagnosis Date  . Acute on chronic congestive heart failure (HCC)   . Anemia, secondary    SECONDARY TO ACUTE BLOOD LOSS  . Anxiety disorder   . Bloody stool 08/29/2011   intermittent along with constipation.   . 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 Depression   . 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 Dysphagia   . Hyperlipidemia   . Hypertension   . Lupus anticoagulant disorder (HCC) 1990  . Morbid obesity (HCC)   . PFO (patent foramen ovale)    TEE 2/10: EF 60%, trivial AI, mild Ao root dilatation, mod PFO with R-L shunting, atrial septal aneurysm;   echo 7/12: EF 65-70%, grade 1 diast dysfxn, mild MR, LVOT showed severe obstruction  . Protein C deficiency (HCC)   . Protein S deficiency (HCC)   . Rectus sheath hematoma 7/12   required  reversal of anticoagulation and c/b DVT req. IVC filter  . Seizure disorder (HCC)     Medications:  Scheduled:  . Chlorhexidine Gluconate Cloth  6 each Topical Daily  . cycloSPORINE  1 drop Both Eyes BID  . ezetimibe  10 mg Oral Daily  . feeding supplement  237 mL Oral Q24H  . furosemide  40 mg Intravenous Q12H  . metoprolol tartrate  12.5 mg Oral BID  . multivitamin with minerals  1 tablet Oral Daily  . pantoprazole  40 mg Oral Daily  . phenytoin  200 mg Oral BID WC  . rosuvastatin  40 mg Oral QHS  . senna  1 tablet Oral BID  . sodium chloride flush  3 mL Intravenous Q12H  . Warfarin - Pharmacist Dosing Inpatient   Does not apply q1600    Assessment: 76 yom admitted for AMS, on warfarin PTA for afib with last dose appearing to be 2/19 PTA. The nursing home [facility] is difficult to read with multiple changes in warfarin order. Most recent PTA regimen appears to be warfarin 3mg  daily.  INR subtherapeutic at 1.7 on admit. Patient continues on phenytoin as PTA for hx of seizures.  INR down to 1.4 despite two boosted doses likely 2/2 missed doses on 2/20 and 2/21. Will boost again tonight - should start to increase. CBC stable.  Goal of Therapy:  INR 2-3 Monitor platelets by anticoagulation protocol: Yes   Plan:  Warfarin 5mg  PO x1 again tonight Daily protime   , PharmD, BCPS, [facility] Clinical Pharmacist [phone] Please check AMION for all [facility] numbers 08/02/2020

## 2020-08-02 NOTE — Progress Notes (Signed)
Pts order for BIPAP is PRN. Pts respiratory status is stable at this time on [place] 3 LPM w/no distress noted. RT will continue to monitor.

## 2020-08-02 NOTE — Progress Notes (Signed)
Orthopedic Tech Progress Note Patient Details:  James Villegas December 01, 1955 [id number]  Ortho Devices Type of Ortho Device: Roland Rack boot Ortho Device/Splint Location: Bilateral Ortho Device/Splint Interventions: Ordered,Application   Post Interventions Patient Tolerated: Well   [name] [name] 08/02/2020, 4:21 PM

## 2020-08-02 NOTE — Progress Notes (Addendum)
Patient ID: James Villegas, male   DOB: [birth year], 65 y.o.   MRN: [id number]  PROGRESS NOTE    James Villegas  WUJ:[id number] DOB: Oct 03, 1955 DOA: 07/30/2020 PCP: Karna Dupes, MD   Brief Narrative:  65 year old male with history of unspecified CVA with right-sided weakness, A. fib on Coumadin, hypertension, obesity, bradycardia, chronic hypercapnic respiratory failure, seizure disorder, chronic diastolic CHF, hyperlipidemia presented with altered mental status from his nursing facility.  Patient was poorly responsive on presentation and was hypoxic down to 50% on room air requiring nonrebreather.  ABG showed hypercapnia and patient was started on BiPAP with some improvement in mental status.  He was given intravenous Lasix.  Assessment & Plan:   Acute on chronic respiratory failure with hypoxia and hypercapnia Acute on chronic diastolic CHF Acute metabolic encephalopathy Mild elevated troponins -Presented with altered mental status and hypoxia/hypercapnia requiring BiPAP and intravenous Lasix.  Chest x-ray on presentation showed pulmonary edema.  COVID-19 and flu testing were negative -Mental status has much improved.   -Currently on 2 L oxygen via nasal cannula -Troponins did not trend up.  Strict input and output.  Daily weights.  Fluid restriction.  Negative balance of 7795 cc since admission -Cardiology following: Continue intravenous Lasix.  Continue metoprolol -Diet as per SLP recommendations -Patient has had difficulty obtaining outpatient sleep study.  Chronic A. Fib/protein S and protein C deficiency -Continue Coumadin.  Monitor INR -Continue metoprolol as per cardiology -Currently rate controlled  Morbid obesity -Outpatient follow-up  Hyperlipidemia -Continue rosuvastatin  History of seizure disorder -Continue phenytoin.  No recent seizures reported  Generalized deconditioning -Overall prognosis is guarded to poor.  Palliative care evaluation appreciated. -CODE  STATUS has not changed back to DNR after palliative care discussion   DVT prophylaxis: Coumadin Code Status: DNR Family Communication: Spoke to sister on phone on 08/02/20 Disposition Plan: Status is: Inpatient  Remains inpatient appropriate because:Inpatient level of care appropriate due to severity of illness   Dispo: The patient is from: SNF              Anticipated d/c is to: SNF              Anticipated d/c date is: 2 days              Patient currently is not medically stable to d/c.   Difficult to place patient No  Consultants: Cardiology/palliative care  Procedures: None  Antimicrobials: None   Subjective: Patient seen and examined at bedside.  Poor historian.  Denies chest pain, nausea, vomiting or fevers.  Shortness breath is improving but still short of breath with some exertion. Objective: Vitals:   08/01/20 2324 08/02/20 0339 08/02/20 0417 08/02/20 0725  BP: (!) 107/94 108/67  122/62  Pulse: 81 87  80  Resp: Temp: 98.5 F (36.9 C) 98.9 F (37.2 C)  98.3 F (36.8 C)  TempSrc: Oral Oral  Oral  SpO2: 91% 97%  94%  Weight:   (!) 141 kg   Height:        Intake/Output Summary (Last 24 hours) at 08/02/2020 0727 Last data filed at 08/02/2020 0342 Gross per 24 hour  Intake 1200 ml  Output 4625 ml  Net -3425 ml   Filed Weights   07/30/20 1610 08/02/20 0417  Weight: 135.6 kg (!) 141 kg    Examination:  General exam: Chronically ill looking.  Looks older than stated age.  Currently on 2 L oxygen via nasal cannula oxygen  respiratory system: Bilateral decreased breath sounds at bases with scattered crackles, no wheezing  cardiovascular system: S1-S2 heard, rate controlled  gastrointestinal system: Abdomen is obese, distended, soft and nontender.  Normal bowel sounds heard  extremities: No cyanosis; lower extremity edema present bilaterally  Central nervous system: Awake; answers some questions.  Still very slow to respond.  Poor historian.  No  focal neurological deficits.  Moving extremities  skin: No obvious rashes/petechiae psychiatry: Flat affect    Data Reviewed: I have personally reviewed following labs and imaging studies  CBC: Recent Labs  Lab 07/30/20 1627 07/30/20 1645 07/31/20 0500 08/01/20 0044 08/02/20 0129  WBC 9.5  --  8.2 8.9 11.6*  NEUTROABS 7.5  --  6.1 6.1 8.7*  HGB 15.1 16.3  16.7 14.1 14.5 14.8  HCT 47.8 48.0  49.0 45.0 44.1 47.0  MCV 96.4  --  96.8 94.6 95.5  PLT 279  --  207 229 254   Basic Metabolic Panel: Recent Labs  Lab 07/30/20 1627 07/30/20 1645 07/31/20 0500 08/01/20 0044 08/02/20 0129  NA 131* 129*  129* 132* 139 139  K 4.5 4.2  4.3 3.9 4.0 3.5  CL 88* 86* 89* 92* 90*  CO2 32  --  34* 35* 39*  GLUCOSE 124* 124* 93 94 92  BUN 14 17 13 10 10   CREATININE 0.75 0.80 0.70 0.57* 0.65  CALCIUM 8.4*  --  8.5* 8.5* 8.9  MG  --   --  1.8 1.7 1.9  PHOS  --   --  3.7  --   --    GFR: Estimated Creatinine Clearance: 141.2 mL/min (by C-G formula based on SCr of 0.65 mg/dL). Liver Function Tests: Recent Labs  Lab 07/30/20 1627 07/31/20 0500 08/01/20 0044  AST 25 19 20   ALT 29 24 25   ALKPHOS 104 91 87  BILITOT 0.8 1.0 0.9  PROT 6.2* 5.3* 5.4*  ALBUMIN 3.1* 2.6* 2.7*   No results for input(s): LIPASE, AMYLASE in the last 168 hours. No results for input(s): AMMONIA in the last 168 hours. Coagulation Profile: Recent Labs  Lab 07/30/20 1627 07/31/20 0500 08/01/20 0044 08/02/20 0129  INR 1.7* 1.7* 1.6* 1.4*   Cardiac Enzymes: No results for input(s): CKTOTAL, CKMB, CKMBINDEX, TROPONINI in the last 168 hours. BNP (last 3 results) No results for input(s): PROBNP in the last 8760 hours. HbA1C: No results for input(s): HGBA1C in the last 72 hours. CBG: Recent Labs  Lab 07/31/20 2203  GLUCAP 106*   Lipid Profile: Recent Labs    07/30/20 1627  TRIG 91   Thyroid Function Tests: Recent Labs    07/31/20 0500  TSH 0.459   Anemia Panel: Recent Labs     07/30/20 1627  FERRITIN 57   Sepsis Labs: Recent Labs  Lab 07/30/20 1627  PROCALCITON <0.10  LATICACIDVEN 1.0    Recent Results (from the past 240 hour(s))  Resp Panel by RT-PCR (Flu A&B, Covid) Nasopharyngeal Swab     Status: None   Collection Time: 07/30/20  4:27 PM   Specimen: Nasopharyngeal Swab; Nasopharyngeal(NP) swabs in vial transport medium  Result Value Ref Range Status   SARS Coronavirus 2 by RT PCR NEGATIVE NEGATIVE Final    Comment: (NOTE) SARS-CoV-2 target nucleic acids are NOT DETECTED.  The SARS-CoV-2 RNA is generally detectable in upper respiratory specimens during the acute phase of infection. The lowest concentration of SARS-CoV-2 viral copies this assay can detect is 138 copies/mL. A negative result does not preclude SARS-Cov-2 infection and  should not be used as the sole basis for treatment or other patient management decisions. A negative result may occur with  improper specimen collection/handling, submission of specimen other than nasopharyngeal swab, presence of viral mutation(s) within the areas targeted by this assay, and inadequate number of viral copies(<138 copies/mL). A negative result must be combined with clinical observations, patient history, and epidemiological information. The expected result is Negative.  Fact Sheet for Patients:  BloggerCourse.com  Fact Sheet for Healthcare Providers:  SeriousBroker.it  This test is no t yet approved or cleared by the [place] FDA and  has been authorized for detection and/or diagnosis of SARS-CoV-2 by FDA under an Emergency Use Authorization (EUA). This EUA will remain  in effect (meaning this test can be used) for the duration of the COVID-19 declaration under Section 564(b)(1) of the Act, 21 U.S.C.section 360bbb-3(b)(1), unless the authorization is terminated  or revoked sooner.       Influenza A by PCR NEGATIVE NEGATIVE Final   Influenza  B by PCR NEGATIVE NEGATIVE Final    Comment: (NOTE) The Xpert Xpress SARS-CoV-2/FLU/RSV plus assay is intended as an aid in the diagnosis of influenza from Nasopharyngeal swab specimens and should not be used as a sole basis for treatment. Nasal washings and aspirates are unacceptable for Xpert Xpress SARS-CoV-2/FLU/RSV testing.  Fact Sheet for Patients: BloggerCourse.com  Fact Sheet for Healthcare Providers: SeriousBroker.it  This test is not yet approved or cleared by the [place] FDA and has been authorized for detection and/or diagnosis of SARS-CoV-2 by FDA under an Emergency Use Authorization (EUA). This EUA will remain in effect (meaning this test can be used) for the duration of the COVID-19 declaration under Section 564(b)(1) of the Act, 21 U.S.C. section 360bbb-3(b)(1), unless the authorization is terminated or revoked.  Performed at [facility], [address], [place], [place] [address]   Blood Culture (routine x 2)     Status: None (Preliminary result)   Collection Time: 07/30/20  4:27 PM   Specimen: BLOOD  Result Value Ref Range Status   Specimen Description BLOOD LEFT ANTECUBITAL  Final   Special Requests   Final    BOTTLES DRAWN AEROBIC AND ANAEROBIC Blood Culture adequate volume   Culture   Final    NO GROWTH 2 DAYS Performed at [facility], [address], [place], [place] [address]    Report Status PENDING  Incomplete  Blood Culture (routine x 2)     Status: None (Preliminary result)   Collection Time: 07/30/20  4:27 PM   Specimen: BLOOD LEFT HAND  Result Value Ref Range Status   Specimen Description BLOOD LEFT HAND  Final   Special Requests   Final    BOTTLES DRAWN AEROBIC AND ANAEROBIC Blood Culture results may not be optimal due to an inadequate volume of blood received in culture bottles   Culture   Final    NO GROWTH 2 DAYS Performed at [facility], [address],  [place], [place] [address]    Report Status PENDING  Incomplete  MRSA PCR Screening     Status: None   Collection Time: 07/31/20 10:39 PM   Specimen: Nasal Mucosa; Nasopharyngeal  Result Value Ref Range Status   MRSA by PCR NEGATIVE NEGATIVE Final    Comment:        The GeneXpert MRSA Assay (FDA approved for NASAL specimens only), is one component of a comprehensive MRSA colonization surveillance program. It is not intended to  diagnose MRSA infection nor to guide or monitor treatment for MRSA infections. Performed at [facility], [address], [place], [place] [address]          Radiology Studies: ECHOCARDIOGRAM COMPLETE  Result Date: 07/31/2020    ECHOCARDIOGRAM REPORT   Patient Name:   Darrold B Galik Date of Exam: 07/31/2020 Medical Rec #:  [id number]      Height:       73.0 in Accession #:    [id number]     Weight:       298.9 lb Date of Birth:  Jan 15, 1956     BSA:          2.552 m Patient Age:    64 years       BP:           112/88 mmHg Patient Gender: M              HR:           81 bpm. Exam Location:  Inpatient Procedure: 2D Echo, Cardiac Doppler, Color Doppler and Intracardiac            Opacification Agent Indications:    I50.40* Unspecified combined systolic (congestive) and diastolic                 (congestive) heart failure  History:        Patient has prior history of Echocardiogram examinations, most                 recent 08/24/2019. CHF, Abnormal ECG, Stroke, Arrythmias:Atrial                 Fibrillation, Signs/Symptoms:Altered Mental Status; Risk                 Factors:Hypertension and Dyslipidemia. Seizure. PFO.  Sonographer:    Sheralyn Boatman RDCS Referring Phys: [id number] ANASTASSIA DOUTOVA  Sonographer Comments: Technically difficult study due to poor echo windows and patient is morbidly obese. Image acquisition challenging due to patient body habitus. IMPRESSIONS  1. Left ventricular ejection fraction, by estimation, is 65 to 70%. The left ventricle has normal function.  The left ventricle has no regional wall motion abnormalities. There is severe eccentric left ventricular hypertrophy. Left ventricular diastolic function could not be evaluated.  2. Right ventricular systolic function is normal. The right ventricular size is normal. There is moderately elevated pulmonary artery systolic pressure.  3. The mitral valve is normal in structure. Trivial mitral valve regurgitation. No evidence of mitral stenosis.  4. The aortic valve is grossly normal. Aortic valve regurgitation is trivial. No aortic stenosis is present.  5. Aortic dilatation noted. There is moderate dilatation of the ascending aorta, measuring 47 mm.  6. The inferior vena cava is dilated in size with <50% respiratory variability, suggesting right atrial pressure of 15 mmHg. Comparison(s): No significant change from prior study. FINDINGS  Left Ventricle: Left ventricular ejection fraction, by estimation, is 65 to 70%. The left ventricle has normal function. The left ventricle has no regional wall motion abnormalities. Definity contrast agent was given IV to delineate the left ventricular  endocardial borders. The left ventricular internal cavity size was normal in size. There is severe eccentric left ventricular hypertrophy. Left ventricular diastolic function could not be evaluated due to atrial fibrillation. Left ventricular diastolic function could not be evaluated. Right Ventricle: The right ventricular size is normal. Right vetricular wall thickness was not well visualized. Right ventricular systolic function is normal. There is moderately  elevated pulmonary artery systolic pressure. The tricuspid regurgitant velocity is 2.91 m/s, and with an assumed right atrial pressure of 15 mmHg, the estimated right ventricular systolic pressure is 48.9 mmHg. Left Atrium: Left atrial size was normal in size. Right Atrium: Right atrial size was normal in size. Pericardium: There is no evidence of pericardial effusion. Mitral  Valve: The mitral valve is normal in structure. Trivial mitral valve regurgitation. No evidence of mitral valve stenosis. Tricuspid Valve: The tricuspid valve is normal in structure. Tricuspid valve regurgitation is mild . No evidence of tricuspid stenosis. Aortic Valve: The aortic valve is grossly normal. Aortic valve regurgitation is trivial. No aortic stenosis is present. Pulmonic Valve: The pulmonic valve was not well visualized. Pulmonic valve regurgitation is not visualized. Aorta: Aortic dilatation noted. There is moderate dilatation of the ascending aorta, measuring 47 mm. Venous: The inferior vena cava is dilated in size with less than 50% respiratory variability, suggesting right atrial pressure of 15 mmHg. IAS/Shunts: The atrial septum is grossly normal.  LEFT VENTRICLE PLAX 2D LVIDd:         4.10 cm LVIDs:         2.90 cm LV PW:         1.90 cm LV IVS:        2.10 cm LVOT diam:     1.90 cm LV SV:         56 LV SV Index:   22 LVOT Area:     2.84 cm  LV Volumes (MOD) LV vol d, MOD A2C: 69.9 ml LV vol d, MOD A4C: 68.0 ml LV vol s, MOD A2C: 15.4 ml LV vol s, MOD A4C: 22.2 ml LV SV MOD A2C:     54.5 ml LV SV MOD A4C:     68.0 ml LV SV MOD BP:      48.6 ml RIGHT VENTRICLE            IVC RV S prime:     8.73 cm/s  IVC diam: 2.80 cm TAPSE (M-mode): 2.1 cm LEFT ATRIUM             Index       RIGHT ATRIUM           Index LA diam:        3.60 cm 1.41 cm/m  RA Area:     15.60 cm LA Vol (A2C):   40.4 ml 15.83 ml/m RA Volume:   44.70 ml  17.51 ml/m LA Vol (A4C):   45.2 ml 17.71 ml/m LA Biplane Vol: 47.1 ml 18.45 ml/m  AORTIC VALVE LVOT Vmax:   123.00 cm/s LVOT Vmean:  86.100 cm/s LVOT VTI:    0.196 m  AORTA Ao Root diam: 4.10 cm Ao Asc diam:  4.40 cm MITRAL VALVE               TRICUSPID VALVE MV Area (PHT): 4.12 cm    TR Peak grad:   33.9 mmHg MV Decel Time: 184 msec    TR Vmax:        291.00 cm/s MV E velocity: 73.40 cm/s                            SHUNTS                            Systemic VTI:  0.20 m  Systemic Diam: 1.90 cm Jodelle Red MD Electronically signed by Jodelle Red MD Signature Date/Time: 07/31/2020/3:59:17 PM    Final         Scheduled Meds: . Chlorhexidine Gluconate Cloth  6 each Topical Daily  . cycloSPORINE  1 drop Both Eyes BID  . ezetimibe  10 mg Oral Daily  . feeding supplement  237 mL Oral Q24H  . furosemide  40 mg Intravenous Q12H  . metoprolol tartrate  12.5 mg Oral BID  . multivitamin with minerals  1 tablet Oral Daily  . pantoprazole  40 mg Oral Daily  . phenytoin  200 mg Oral BID WC  . rosuvastatin  40 mg Oral QHS  . senna  1 tablet Oral BID  . sodium chloride flush  3 mL Intravenous Q12H  . warfarin  5 mg Oral ONCE-1600  . Warfarin - Pharmacist Dosing Inpatient   Does not apply q1600   Continuous Infusions: . sodium chloride            Glade Lloyd, MD [facility] 08/02/2020, 7:27 AM

## 2020-08-03 DIAGNOSIS — I509 Heart failure, unspecified: Secondary | ICD-10-CM | POA: Diagnosis not present

## 2020-08-03 LAB — BASIC METABOLIC PANEL
Anion gap: 9 (ref 5–15)
BUN: 12 mg/dL (ref 8–23)
CO2: 40 mmol/L — ABNORMAL HIGH (ref 22–32)
Calcium: 8.6 mg/dL — ABNORMAL LOW (ref 8.9–10.3)
Chloride: 91 mmol/L — ABNORMAL LOW (ref 98–111)
Creatinine, Ser: 0.54 mg/dL — ABNORMAL LOW (ref 0.61–1.24)
GFR, Estimated: >60 mL/min (ref >60)
Glucose, Bld: 100 mg/dL — ABNORMAL HIGH (ref 70–99)
Potassium: 3.9 mmol/L (ref 3.5–5.1)
Sodium: 140 mmol/L (ref 135–145)

## 2020-08-03 LAB — MAGNESIUM: Magnesium: 1.9 mg/dL (ref 1.7–2.4)

## 2020-08-03 LAB — PROTIME-INR
INR: 1.4 — ABNORMAL HIGH (ref 0.8–1.2)
Prothrombin Time: 16.3 seconds — ABNORMAL HIGH (ref 11.4–15.2)

## 2020-08-03 MED ORDER — MUPIROCIN CALCIUM 2 % EX CREA
TOPICAL_CREAM | Freq: Two times a day (BID) | CUTANEOUS | Status: DC
  Filled 2020-08-03: qty 15

## 2020-08-03 MED ORDER — WARFARIN SODIUM 5 MG PO TABS
7.0000 mg | ORAL_TABLET | Freq: Once | ORAL | Status: AC
  Administered 2020-08-03: 7 mg via ORAL
  Filled 2020-08-03: qty 1

## 2020-08-03 MED ORDER — FUROSEMIDE 10 MG/ML IJ SOLN
80.0000 mg | Freq: Two times a day (BID) | INTRAMUSCULAR | Status: DC
  Administered 2020-08-03 – 2020-08-06 (×6): 80 mg via INTRAVENOUS
  Filled 2020-08-03 (×7): qty 8

## 2020-08-03 NOTE — Progress Notes (Signed)
Daily Progress Note   Patient Name: James Villegas       Date: 08/03/2020 DOB: 1955/09/07  Age: 65 y.o. MRN#: [id number] Attending Physician: Glade Lloyd, MD Primary Care Physician: Karna Dupes, MD Admit Date: 07/30/2020  Reason for Follow-up: continued   Subjective: Patient resting comfortably in bed - no acute complaints, other than wanting to go back to his SNF. Currently on 2L oxygen.  I introduced MOST form and explained each section in detail. Regarding the section about CPR (section A), he initially states he wants this. I attempt to clarify that CPR means resuscitation and is not consistent with our discussion 2 days ago. He states "I want to be resuscitated - living sounds pretty good right now".   Regarding the next section of the MOST form (section B), he states he wants "comfort measures". At this point, I state that comfort measures is not consistent with wanting CPR/resuscitation.   I provide education about evidenced based poor outcomes in similar hospitalized patients, as the cause of the arrest is likely associated with chronic/terminal disease rather than a reversible acute cardio-pulmonary event. I also explained that DNR/DNI does not change the medical plan and it only comes into effect after a person has arrested (died).  It is a protective measure to keep [place] from harming the patient in their last moments of life. When I ask again about CPR he states "let me go".   Due to inconsistencies in his expressed wishes today as well as on the day he was admitted (rescinded his DNR and then later re-instating it), I recommended taking time for further consideration. I provide him with a copy of "Hard Choices" book, and point out several key passages that may be helpful.  Discussed that I want him to make an informed decision and not feel rushed. Let him know PMT would follow-up prior to discharge.   Length of Stay: 4  Current Medications: Scheduled Meds:  . cycloSPORINE  1 drop Both Eyes BID  . ezetimibe  10 mg Oral Daily  . feeding supplement  237 mL Oral Q24H  . furosemide  80 mg Intravenous Q12H  . magnesium oxide  400 mg Oral Daily  . metoprolol tartrate  12.5 mg Oral BID  . multivitamin with minerals  1 tablet Oral Daily  . pantoprazole  40 mg Oral Daily  . phenytoin  200 mg Oral BID WC  . potassium chloride  40 mEq Oral Daily  . rosuvastatin  40 mg Oral QHS  . senna  1 tablet Oral BID  . sodium chloride flush  3 mL Intravenous Q12H  . warfarin  7 mg Oral ONCE-1600  . Warfarin - Pharmacist Dosing Inpatient   Does not apply q1600    Continuous Infusions: . sodium chloride      PRN Meds: sodium chloride, acetaminophen **OR** acetaminophen, bisacodyl, polyethylene glycol, sodium chloride flush  Physical Exam Vitals reviewed.  Constitutional:      General: He is not in acute distress.    Appearance: He is obese.  Cardiovascular:     Rate and Rhythm: Normal rate. Rhythm irregularly irregular.     Comments: A-fib Pulmonary:     Effort: Pulmonary effort is normal.  Neurological:     Mental Status: He is alert and oriented to person, place, and time.     Motor: Weakness present.     Comments: Right hemiplegia  Psychiatric:        Mood and Affect: Affect is flat.             Vital Signs: BP 117/68 (BP Location: Left Arm)   Pulse 82   Temp 98.9 F (37.2 C) (Oral)   Resp (!) 27   Ht 6\' 1"  (1.854 m)   Wt (!) 138.7 kg   SpO2 95%   BMI 40.34 kg/m  SpO2: SpO2: 95 % O2 Device: O2 Device: Nasal Cannula  Intake/output summary:   Intake/Output Summary (Last 24 hours) at 08/03/2020 1209 Last data filed at 08/03/2020 0548 Gross per 24 hour  Intake 433 ml  Output 1350 ml  Net -917 ml   LBM: Last BM Date: 07/31/20 Baseline Weight:  Weight: 135.6 kg Most recent weight: Weight: (!) 138.7 kg       Palliative Assessment/Data: PPS 30%       Palliative Care Assessment & Plan   HPI/Patient Profile: 64 y.o. male  with past medical history of CVA with right sided weakness, atrial fibrillation on Coumadin, HTN, and chronic diastolic CHF who presented to the emergency department from SNF on 07/30/2020 with altered mental status.  ED Course: He was poorly responsive on arrival and hypoxic down to 50% requiring NRB mask. ABG showed hypercapnia and patient was started on BiPAP with improved mental status. Chest x-ray showed cardiomegaly and mild diffuse interstitial opacity most consistent with edema. He was admitted to [facility] for acute chronic respiratory failure with hypoxia and hypercapnia and acute on chronic CHF.   Assessment: - acute on chronic respiratory failure with hypoxia and hypercapnia (improving) - acute on chronic diastolic CHF - acute metabolic encephalopathy - chronic A-fib - remote history of CVA with residual right hemiplegia - generalized deconditioning  Recommendations/Plan:  Continue DNR/DNI for now  Continue current medical care  Goal of care is to return to SNF  Needs follow-up regarding MOST form prior to discharge  Goals of Care and Additional Recommendations:  Limitations on Scope of Treatment: full scope treatment  Code Status:    Code Status Orders  (From admission, onward)         Start     Ordered   07/31/20 1601  Do not attempt resuscitation (DNR)  Continuous       Question Answer Comment  In the event of cardiac or respiratory ARREST Do not call a "code blue"   In the event  of cardiac or respiratory ARREST Do not perform Intubation, CPR, defibrillation or ACLS   In the event of cardiac or respiratory ARREST Use medication by any route, position, wound care, and other measures to relive pain and suffering. May use oxygen, suction and manual treatment of airway obstruction as  needed for comfort.      07/31/20 1600        Code Status History    Date Active Date Inactive Code Status Order ID Comments User Context   07/31/2020 1045 07/31/2020 1600 Full Code [id number]  Glade Lloyd, MD ED   07/30/2020 2352 07/31/2020 1044 DNR [id number]  Therisa Doyne, MD ED   02/20/2020 1520 02/28/2020 1908 DNR [id number]  Martina Sinner, MD ED   02/20/2020 1501 02/20/2020 1520 Full Code [id number]  Martina Sinner, MD ED   08/03/2018 0053 08/06/2018 1633 DNR [id number]  Coralie Keens, MD Inpatient   08/02/2018 2220 08/03/2018 0053 Full Code [id number]  Arrien, York Ram, MD Inpatient   Advance Care Planning Activity    Advance Directive Documentation   Flowsheet Row Most Recent Value  Type of Advance Directive Healthcare Power of Attorney, Living will  Pre-existing out of facility DNR order (yellow form or pink MOST form) --  [current dnr order on chart]  "MOST" Form in Place? --       Prognosis:  Unable to determine, overall poor  Discharge Planning:  SNF   Thank you for allowing the Palliative Medicine Team to assist in the care of this patient.   Total Time 25 minutes Prolonged Time Billed  no       Greater than 50%  of this time was spent counseling and coordinating care related to the above assessment and plan.  Merry Proud, NP  Please contact Palliative Medicine Team phone at [phone] for questions and concerns.

## 2020-08-03 NOTE — Progress Notes (Signed)
Patient ID: James Villegas, male   DOB: 10-Mar-1956, 65 y.o.   MRN: [id number]  PROGRESS NOTE    James Villegas  HYQ:[id number] DOB: 10-Mar-1956 DOA: 07/30/2020 PCP: Karna DupesBlake, Khashana A, MD   Brief Narrative:  65 year old male with history of unspecified CVA with right-sided weakness, A. fib on Coumadin, hypertension, obesity, bradycardia, chronic hypercapnic respiratory failure, seizure disorder, chronic diastolic CHF, hyperlipidemia presented with altered mental status from his nursing facility.  Patient was poorly responsive on presentation and was hypoxic down to 50% on room air requiring nonrebreather.  ABG showed hypercapnia and patient was started on BiPAP with some improvement in mental status.  He was given intravenous Lasix.  Assessment & Plan:   Acute on chronic respiratory failure with hypoxia and hypercapnia Acute on chronic diastolic CHF Acute metabolic encephalopathy Mild elevated troponins -Presented with altered mental status and hypoxia/hypercapnia requiring BiPAP and intravenous Lasix.  Chest x-ray on presentation showed pulmonary edema.  COVID-19 and flu testing were negative -Mental status has much improved.   -Currently on 4 L oxygen via nasal cannula -Troponins did not trend up.  Strict input and output.  Daily weights.  Fluid restriction.  Negative balance of 9152 cc since admission -Cardiology following: Continue intravenous Lasix.  Continue metoprolol -Diet as per SLP recommendations -Patient has had difficulty obtaining outpatient sleep study.  Chronic A. Fib/protein S and protein C deficiency -Continue Coumadin.  Monitor INR -Continue metoprolol as per cardiology -Currently rate controlled  Morbid obesity -Outpatient follow-up  Hyperlipidemia -Continue rosuvastatin  History of seizure disorder -Continue phenytoin.  No recent seizures reported  Generalized deconditioning -Overall prognosis is guarded to poor.  Palliative care evaluation appreciated. -CODE  STATUS has not changed back to DNR after palliative care discussion   DVT prophylaxis: Coumadin Code Status: DNR Family Communication: Spoke to sister on phone on 08/02/20 Disposition Plan: Status is: Inpatient  Remains inpatient appropriate because:Inpatient level of care appropriate due to severity of illness   Dispo: The patient is from: SNF              Anticipated d/c is to: SNF              Anticipated d/c date is: 2 days              Patient currently is not medically stable to d/c.   Difficult to place patient No  Consultants: Cardiology/palliative care  Procedures: None  Antimicrobials: None   Subjective: Patient seen and examined at bedside.  Poor historian.  No worsening shortness of breath reported.  Denies any overnight fever, chest pain, vomiting or abdominal pain.   Objective: Vitals:   08/02/20 2147 08/02/20 2327 08/03/20 0338 08/03/20 0548  BP: 107/63 113/70 111/79   Pulse: 84 79 84   Resp:  18 19   Temp:  98 F (36.7 C) 98.1 F (36.7 C)   TempSrc:  Oral Oral   SpO2:  95% 95%   Weight:    (!) 138.7 kg  Height:        Intake/Output Summary (Last 24 hours) at 08/03/2020 0717 Last data filed at 08/03/2020 0548 Gross per 24 hour  Intake 993 ml  Output 2350 ml  Net -1357 ml   Filed Weights   07/30/20 1610 08/02/20 0417 08/03/20 0548  Weight: 135.6 kg (!) 141 kg (!) 138.7 kg    Examination:  General exam: Looks chronically ill and deconditioned.  Looks older than stated age.  Currently on 4 L oxygen via nasal  cannula oxygen  respiratory system: Decreased breath sounds at bases bilaterally with basilar crackles cardiovascular system: Rate controlled, S1-S2 heard gastrointestinal system: Abdomen is morbidly obese, distended, soft and nontender.  Bowel sounds are heard extremities: Bilateral lower extremity edema present; no clubbing  Central nervous system: Alert, slow to respond, answers some questions.  Poor historian.  No focal neurological  deficits.  Moves extremities skin: No obvious ecchymosis/lesions  psychiatry: Affect is flat    Data Reviewed: I have personally reviewed following labs and imaging studies  CBC: Recent Labs  Lab 07/30/20 1627 07/30/20 1645 07/31/20 0500 08/01/20 0044 08/02/20 0129  WBC 9.5  --  8.2 8.9 11.6*  NEUTROABS 7.5  --  6.1 6.1 8.7*  HGB 15.1 16.3  16.7 14.1 14.5 14.8  HCT 47.8 48.0  49.0 45.0 44.1 47.0  MCV 96.4  --  96.8 94.6 95.5  PLT 279  --  207 229 254   Basic Metabolic Panel: Recent Labs  Lab 07/30/20 1627 07/30/20 1645 07/31/20 0500 08/01/20 0044 08/02/20 0129 08/03/20 0021  NA 131* 129*  129* 132* 139 139 140  K 4.5 4.2  4.3 3.9 4.0 3.5 3.9  CL 88* 86* 89* 92* 90* 91*  CO2 32  --  34* 35* 39* 40*  GLUCOSE 124* 124* 93 94 92 100*  BUN 14 17 13 10 10 12   CREATININE 0.75 0.80 0.70 0.57* 0.65 0.54*  CALCIUM 8.4*  --  8.5* 8.5* 8.9 8.6*  MG  --   --  1.8 1.7 1.9 1.9  PHOS  --   --  3.7  --   --   --    GFR: Estimated Creatinine Clearance: 140 mL/min (A) (by C-G formula based on SCr of 0.54 mg/dL (L)). Liver Function Tests: Recent Labs  Lab 07/30/20 1627 07/31/20 0500 08/01/20 0044  AST 25 19 20   ALT 29 24 25   ALKPHOS 104 91 87  BILITOT 0.8 1.0 0.9  PROT 6.2* 5.3* 5.4*  ALBUMIN 3.1* 2.6* 2.7*   No results for input(s): LIPASE, AMYLASE in the last 168 hours. No results for input(s): AMMONIA in the last 168 hours. Coagulation Profile: Recent Labs  Lab 07/30/20 1627 07/31/20 0500 08/01/20 0044 08/02/20 0129 08/03/20 0021  INR 1.7* 1.7* 1.6* 1.4* 1.4*   Cardiac Enzymes: No results for input(s): CKTOTAL, CKMB, CKMBINDEX, TROPONINI in the last 168 hours. BNP (last 3 results) No results for input(s): PROBNP in the last 8760 hours. HbA1C: No results for input(s): HGBA1C in the last 72 hours. CBG: Recent Labs  Lab 07/31/20 2203  GLUCAP 106*   Lipid Profile: No results for input(s): CHOL, HDL, LDLCALC, TRIG, CHOLHDL, LDLDIRECT in the last 72  hours. Thyroid Function Tests: No results for input(s): TSH, T4TOTAL, FREET4, T3FREE, THYROIDAB in the last 72 hours. Anemia Panel: No results for input(s): VITAMINB12, FOLATE, FERRITIN, TIBC, IRON, RETICCTPCT in the last 72 hours. Sepsis Labs: Recent Labs  Lab 07/30/20 1627  PROCALCITON <0.10  LATICACIDVEN 1.0    Recent Results (from the past 240 hour(s))  Resp Panel by RT-PCR (Flu A&B, Covid) Nasopharyngeal Swab     Status: None   Collection Time: 07/30/20  4:27 PM   Specimen: Nasopharyngeal Swab; Nasopharyngeal(NP) swabs in vial transport medium  Result Value Ref Range Status   SARS Coronavirus 2 by RT PCR NEGATIVE NEGATIVE Final    Comment: (NOTE) SARS-CoV-2 target nucleic acids are NOT DETECTED.  The SARS-CoV-2 RNA is generally detectable in upper respiratory specimens during the acute phase of  infection. The lowest concentration of SARS-CoV-2 viral copies this assay can detect is 138 copies/mL. A negative result does not preclude SARS-Cov-2 infection and should not be used as the sole basis for treatment or other patient management decisions. A negative result may occur with  improper specimen collection/handling, submission of specimen other than nasopharyngeal swab, presence of viral mutation(s) within the areas targeted by this assay, and inadequate number of viral copies(<138 copies/mL). A negative result must be combined with clinical observations, patient history, and epidemiological information. The expected result is Negative.  Fact Sheet for Patients:  BloggerCourse.com  Fact Sheet for Healthcare Providers:  SeriousBroker.it  This test is no t yet approved or cleared by the [place] FDA and  has been authorized for detection and/or diagnosis of SARS-CoV-2 by FDA under an Emergency Use Authorization (EUA). This EUA will remain  in effect (meaning this test can be used) for the duration of the COVID-19  declaration under Section 564(b)(1) of the Act, 21 U.S.C.section 360bbb-3(b)(1), unless the authorization is terminated  or revoked sooner.       Influenza A by PCR NEGATIVE NEGATIVE Final   Influenza B by PCR NEGATIVE NEGATIVE Final    Comment: (NOTE) The Xpert Xpress SARS-CoV-2/FLU/RSV plus assay is intended as an aid in the diagnosis of influenza from Nasopharyngeal swab specimens and should not be used as a sole basis for treatment. Nasal washings and aspirates are unacceptable for Xpert Xpress SARS-CoV-2/FLU/RSV testing.  Fact Sheet for Patients: BloggerCourse.com  Fact Sheet for Healthcare Providers: SeriousBroker.it  This test is not yet approved or cleared by the [place] FDA and has been authorized for detection and/or diagnosis of SARS-CoV-2 by FDA under an Emergency Use Authorization (EUA). This EUA will remain in effect (meaning this test can be used) for the duration of the COVID-19 declaration under Section 564(b)(1) of the Act, 21 U.S.C. section 360bbb-3(b)(1), unless the authorization is terminated or revoked.  Performed at [facility], [address], [place], [place] [address]   Blood Culture (routine x 2)     Status: None (Preliminary result)   Collection Time: 07/30/20  4:27 PM   Specimen: BLOOD  Result Value Ref Range Status   Specimen Description BLOOD LEFT ANTECUBITAL  Final   Special Requests   Final    BOTTLES DRAWN AEROBIC AND ANAEROBIC Blood Culture adequate volume   Culture   Final    NO GROWTH 3 DAYS Performed at [facility], [address], [place], [place] [address]    Report Status PENDING  Incomplete  Blood Culture (routine x 2)     Status: None (Preliminary result)   Collection Time: 07/30/20  4:27 PM   Specimen: BLOOD LEFT HAND  Result Value Ref Range Status   Specimen Description BLOOD LEFT HAND  Final   Special Requests   Final    BOTTLES DRAWN AEROBIC AND  ANAEROBIC Blood Culture results may not be optimal due to an inadequate volume of blood received in culture bottles   Culture   Final    NO GROWTH 3 DAYS Performed at [facility], [address] [place], [place] [address]    Report Status PENDING  Incomplete  MRSA PCR Screening     Status: None   Collection Time: 07/31/20 10:39 PM   Specimen: Nasal Mucosa; Nasopharyngeal  Result Value Ref Range Status   MRSA by PCR NEGATIVE NEGATIVE Final    Comment:        The  GeneXpert MRSA Assay (FDA approved for NASAL specimens only), is one component of a comprehensive MRSA colonization surveillance program. It is not intended to diagnose MRSA infection nor to guide or monitor treatment for MRSA infections. Performed at [facility], [address], [place], [place] [address]          Radiology Studies: No results found.      Scheduled Meds: . Chlorhexidine Gluconate Cloth  6 each Topical Daily  . cycloSPORINE  1 drop Both Eyes BID  . ezetimibe  10 mg Oral Daily  . feeding supplement  237 mL Oral Q24H  . furosemide  40 mg Intravenous Q12H  . magnesium oxide  400 mg Oral Daily  . metoprolol tartrate  12.5 mg Oral BID  . multivitamin with minerals  1 tablet Oral Daily  . pantoprazole  40 mg Oral Daily  . phenytoin  200 mg Oral BID WC  . potassium chloride  40 mEq Oral Daily  . rosuvastatin  40 mg Oral QHS  . senna  1 tablet Oral BID  . sodium chloride flush  3 mL Intravenous Q12H  . warfarin  7 mg Oral ONCE-1600  . Warfarin - Pharmacist Dosing Inpatient   Does not apply q1600   Continuous Infusions: . sodium chloride            Glade Lloyd, MD [facility] 08/03/2020, 7:17 AM

## 2020-08-03 NOTE — Plan of Care (Signed)

## 2020-08-03 NOTE — Care Management Important Message (Signed)
Important Message  Patient Details  Name: James Villegas MRN: [id number] Date of Birth: 18-Dec-1955   Medicare Important Message Given:  Yes     Renie Ora 08/03/2020, 3:55 PM

## 2020-08-03 NOTE — Progress Notes (Signed)
Progress Note  Patient Name: James Villegas Date of Encounter: 08/03/2020  [facility] Cardiologist: Tobias Alexander, MD   Subjective   Feeling well.  He feels that his breathing is finally starting to improve.  Inpatient Medications    Scheduled Meds: . cycloSPORINE  1 drop Both Eyes BID  . ezetimibe  10 mg Oral Daily  . feeding supplement  237 mL Oral Q24H  . furosemide  40 mg Intravenous Q12H  . magnesium oxide  400 mg Oral Daily  . metoprolol tartrate  12.5 mg Oral BID  . multivitamin with minerals  1 tablet Oral Daily  . pantoprazole  40 mg Oral Daily  . phenytoin  200 mg Oral BID WC  . potassium chloride  40 mEq Oral Daily  . rosuvastatin  40 mg Oral QHS  . senna  1 tablet Oral BID  . sodium chloride flush  3 mL Intravenous Q12H  . warfarin  7 mg Oral ONCE-1600  . Warfarin - Pharmacist Dosing Inpatient   Does not apply q1600   Continuous Infusions: . sodium chloride     PRN Meds: sodium chloride, acetaminophen **OR** acetaminophen, bisacodyl, polyethylene glycol, sodium chloride flush   Vital Signs    Vitals:   08/02/20 2327 08/03/20 0338 08/03/20 0548 08/03/20 0724  BP: 113/70 111/79  126/87  Pulse: 79 84  83  Resp: 18 19  19   Temp: 98 F (36.7 C) 98.1 F (36.7 C)  97.8 F (36.6 C)  TempSrc: Oral Oral  Oral  SpO2: 95% 95%  96%  Weight:   (!) 138.7 kg   Height:        Intake/Output Summary (Last 24 hours) at 08/03/2020 1053 Last data filed at 08/03/2020 0548 Gross per 24 hour  Intake 673 ml  Output 1350 ml  Net -677 ml   Last 3 Weights 08/03/2020 08/02/2020 07/30/2020  Weight (lbs) 305 lb 12.5 oz 310 lb 13.6 oz 298 lb 15.1 oz  Weight (kg) 138.7 kg 141 kg 135.6 kg      Telemetry    Atrial fibrillation.  Rates less than 100 bpm.- Personally Reviewed  ECG    N/A- Personally Reviewed  Physical Exam   VS:  BP 126/87 (BP Location: Left Arm)   Pulse 83   Temp 97.8 F (36.6 C) (Oral)   Resp 19   Ht 6\' 1"  (1.854 m)   Wt (!) 138.7 kg    SpO2 96%   BMI 40.34 kg/m  , BMI Body mass index is 40.34 kg/m. GENERAL: Chronically ill-appearing.  No acute distress. HEENT: Pupils equal round and reactive, fundi not visualized, oral mucosa unremarkable NECK:  + jugular venous distention, waveform within normal limits, carotid upstroke brisk and symmetric LUNGS:  Clear to auscultation bilaterally HEART:  RRR.  PMI not displaced or sustained,S1 and S2 within normal limits, no S3, no S4, no clicks, no rubs, no murmurs ABD:  Flat, positive bowel sounds normal in frequency in pitch, no bruits, no rebound, no guarding, no midline pulsatile mass, no hepatomegaly, no splenomegaly EXT:  2 plus pulses throughout, 2++ lower extremity edema, no cyanosis no clubbing SKIN:  No rashes no nodules NEURO:  Cranial nerves II through XII grossly intact, motor grossly intact throughout PSYCH:  Cognitively intact, oriented to person place and time   Labs    High Sensitivity Troponin:   Recent Labs  Lab 07/30/20 1627 07/30/20 1821 07/31/20 0100  TROPONINIHS 76* 83* 94*      Chemistry Recent Labs  Lab 07/30/20 1627 07/30/20 1645 07/31/20 0500 08/01/20 0044 08/02/20 0129 08/03/20 0021  NA 131*   < > 132* 139 139 140  K 4.5   < > 3.9 4.0 3.5 3.9  CL 88*   < > 89* 92* 90* 91*  CO2 32  --  34* 35* 39* 40*  GLUCOSE 124*   < > 93 94 92 100*  BUN 14   < > 13 10 10 12   CREATININE 0.75   < > 0.70 0.57* 0.65 0.54*  CALCIUM 8.4*  --  8.5* 8.5* 8.9 8.6*  PROT 6.2*  --  5.3* 5.4*  --   --   ALBUMIN 3.1*  --  2.6* 2.7*  --   --   AST 25  --  19 20  --   --   ALT 29  --  24 25  --   --   ALKPHOS 104  --  91 87  --   --   BILITOT 0.8  --  1.0 0.9  --   --   GFRNONAA >60  --  >60 >60 >60 >60  ANIONGAP 11  --  9 12 10 9    < > = values in this interval not displayed.     Hematology Recent Labs  Lab 07/31/20 0500 08/01/20 0044 08/02/20 0129  WBC 8.2 8.9 11.6*  RBC 4.65 4.66 4.92  HGB 14.1 14.5 14.8  HCT 45.0 44.1 47.0  MCV 96.8 94.6 95.5   MCH 30.3 31.1 30.1  MCHC 31.3 32.9 31.5  RDW 14.3 14.3 14.2  PLT 207 229 254    BNP Recent Labs  Lab 07/30/20 1627  BNP 960.3*     DDimer  Recent Labs  Lab 07/30/20 1627  DDIMER 0.57*     Radiology    No results found.  Cardiac Studies   Echo 07/31/20: 1. Left ventricular ejection fraction, by estimation, is 65 to 70%. The  left ventricle has normal function. The left ventricle has no regional  wall motion abnormalities. There is severe eccentric left ventricular  hypertrophy. Left ventricular diastolic  function could not be evaluated.  2. Right ventricular systolic function is normal. The right ventricular  size is normal. There is moderately elevated pulmonary artery systolic  pressure.  3. The mitral valve is normal in structure. Trivial mitral valve  regurgitation. No evidence of mitral stenosis.  4. The aortic valve is grossly normal. Aortic valve regurgitation is  trivial. No aortic stenosis is present.  5. Aortic dilatation noted. There is moderate dilatation of the ascending  aorta, measuring 47 mm.  6. The inferior vena cava is dilated in size with <50% respiratory  variability, suggesting right atrial pressure of 15 mmHg.   Patient Profile     Mr. Collister is a 65 year old man with prior stroke with residual right hemiparesis, wheelchair-bound nursing home resident, rectus sheath hematoma, coagulopathy, persistent atrial fibrillation on warfarin, ascending aortic aneurysm, hypertension, morbid obesity with obesity hypoventilation syndrome, and ascending aortic aneurysm admitted with hypoxic and hypercarbic respiratory failure in the setting of acute on chronic diastolic heart failure and obesity hypoventilation syndrome.     Assessment & Plan    # Hypoxic/hypercarbic respiratory failure: # OSA:  Needs sleep study with [facility] lift.  Query whether it is possible to get a CPAP/BiPAP with some basic settings or inpatient titration.  Understandably, this  is not a typical practice.  However may be reasonable for his circumstances if possible.  # Acute on chronic  diastolic heart failure:  BNP elevated to 960.  Likely underestimates his degree of volume overload 2/2 morbid obesity.  Diuresis is slowing.  He was only net -1.3 L yesterday.  We will increase furosemide to 80 mg twice daily.  He is still massively volume overloaded.  Unna boots were placed yesterday.   # Persistent atrial fibrillation:  Rates well-controlled.  Continue warfarin and metoprolol.  # Morbid obesity:  # Bed bound: # Prior stroke: Immobilized 2/2 morbid obesity and prior stroke with hemiparesis.  Unable to stand anymore 2/2 pain in L knee.   # Ascending aortic aneurysm: Noted to have a 4.7 cm ascending aortic aneurysm on echo yesterday.  Blood pressure control as above.  It was 4.5 cm on 08/2019.  He is scheduled for CT-A on 09/2020.     For questions or updates, please contact [facility] Please consult [email or web address] for contact info under        Signed, Chilton Si, MD  08/03/2020, 10:53 AM

## 2020-08-03 NOTE — Progress Notes (Signed)
ANTICOAGULATION CONSULT NOTE   Pharmacy Consult for Warfarin Indication: atrial fibrillation  Allergies  Allergen Reactions  . Fenofibrate Other (See Comments)    Per [facility]    Patient Measurements: Height: 6\' 1"  (185.4 cm) Weight: (!) 138.7 kg (305 lb 12.5 oz) IBW/kg (Calculated) : 79.9  Vital Signs: Temp: 98.1 F (36.7 C) (02/25 0338) Temp Source: Oral (02/25 0338) BP: 111/79 (02/25 0338) Pulse Rate: 84 (02/25 0338)  Labs: Recent Labs    08/01/20 0044 08/02/20 0129 08/03/20 0021  HGB 14.5 14.8  --   HCT 44.1 47.0  --   PLT 229 254  --   LABPROT 18.7* 16.9* 16.3*  INR 1.6* 1.4* 1.4*  CREATININE 0.57* 0.65 0.54*    Estimated Creatinine Clearance: 140 mL/min (A) (by C-G formula based on SCr of 0.54 mg/dL (L)).   Medical History: Past Medical History:  Diagnosis Date  . Acute on chronic congestive heart failure (HCC)   . Anemia, secondary    SECONDARY TO ACUTE BLOOD LOSS  . Anxiety disorder   . Bloody stool 08/29/2011   intermittent along with constipation.   . 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 Depression   . 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 Dysphagia   . Hyperlipidemia   . Hypertension   . Lupus anticoagulant disorder (HCC) 1990  . Morbid obesity (HCC)   . PFO (patent foramen ovale)    TEE 2/10: EF 60%, trivial AI, mild Ao root dilatation, mod PFO with R-L shunting, atrial septal aneurysm;   echo 7/12: EF 65-70%, grade 1 diast dysfxn, mild MR, LVOT showed severe obstruction  . Protein C deficiency (HCC)   . Protein S deficiency (HCC)   . Rectus sheath hematoma 7/12   required reversal of anticoagulation and c/b DVT req. IVC filter  . Seizure disorder (HCC)     Medications:  Scheduled:  . Chlorhexidine Gluconate Cloth  6 each Topical Daily  . cycloSPORINE  1 drop Both Eyes BID  . ezetimibe  10 mg Oral Daily  . feeding supplement   237 mL Oral Q24H  . furosemide  40 mg Intravenous Q12H  . magnesium oxide  400 mg Oral Daily  . metoprolol tartrate  12.5 mg Oral BID  . multivitamin with minerals  1 tablet Oral Daily  . pantoprazole  40 mg Oral Daily  . phenytoin  200 mg Oral BID WC  . potassium chloride  40 mEq Oral Daily  . rosuvastatin  40 mg Oral QHS  . senna  1 tablet Oral BID  . sodium chloride flush  3 mL Intravenous Q12H  . Warfarin - Pharmacist Dosing Inpatient   Does not apply q1600    Assessment: 19 yom admitted for AMS, on warfarin PTA for afib with last dose appearing to be 2/19 PTA. The nursing home [facility] is difficult to read with multiple changes in warfarin order. Most recent PTA regimen appears to be warfarin 3mg  daily. INR subtherapeutic at 1.7 on admit. Patient continues on phenytoin as PTA for hx of seizures (lvl therapeutic on admit).  INR remains subtherapeutic at 1.4, pt missed doses on 2/20 and 2/21 but would expected INR to begin to rise with continued boosted doses.   Goal of Therapy:  INR 2-3 Monitor platelets by anticoagulation protocol: Yes   Plan:  Warfarin 7mg  PO x1 tonight Daily protime   3/20  Malachi Carl, PharmD, BCPS, [facility] Clinical Pharmacist [phone] Please check AMION for all [facility] Pharmacy numbers 08/03/2020

## 2020-08-04 LAB — PROTIME-INR
INR: 1.5 — ABNORMAL HIGH (ref 0.8–1.2)
Prothrombin Time: 17.4 seconds — ABNORMAL HIGH (ref 11.4–15.2)

## 2020-08-04 LAB — CULTURE, BLOOD (ROUTINE X 2)
Culture: NO GROWTH
Culture: NO GROWTH
Special Requests: ADEQUATE

## 2020-08-04 LAB — BASIC METABOLIC PANEL
Anion gap: 7 (ref 5–15)
BUN: 11 mg/dL (ref 8–23)
CO2: 43 mmol/L — ABNORMAL HIGH (ref 22–32)
Calcium: 8.8 mg/dL — ABNORMAL LOW (ref 8.9–10.3)
Chloride: 90 mmol/L — ABNORMAL LOW (ref 98–111)
Creatinine, Ser: 0.58 mg/dL — ABNORMAL LOW (ref 0.61–1.24)
GFR, Estimated: >60 mL/min (ref >60)
Glucose, Bld: 99 mg/dL (ref 70–99)
Potassium: 4.1 mmol/L (ref 3.5–5.1)
Sodium: 140 mmol/L (ref 135–145)

## 2020-08-04 LAB — MAGNESIUM: Magnesium: 2.1 mg/dL (ref 1.7–2.4)

## 2020-08-04 MED ORDER — WARFARIN SODIUM 3 MG PO TABS
6.0000 mg | ORAL_TABLET | Freq: Once | ORAL | Status: AC
  Administered 2020-08-04: 6 mg via ORAL
  Filled 2020-08-04: qty 2

## 2020-08-04 NOTE — Progress Notes (Signed)
Progress Note  Patient Name: James Villegas Date of Encounter: 08/04/2020  [facility] Cardiologist: James Alexander, MD   Subjective   No CP; dyspnea improving  Inpatient Medications    Scheduled Meds: . cycloSPORINE  1 drop Both Eyes BID  . ezetimibe  10 mg Oral Daily  . feeding supplement  237 mL Oral Q24H  . furosemide  80 mg Intravenous Q12H  . magnesium oxide  400 mg Oral Daily  . metoprolol tartrate  12.5 mg Oral BID  . multivitamin with minerals  1 tablet Oral Daily  . mupirocin cream   Topical BID  . pantoprazole  40 mg Oral Daily  . phenytoin  200 mg Oral BID WC  . potassium chloride  40 mEq Oral Daily  . rosuvastatin  40 mg Oral QHS  . senna  1 tablet Oral BID  . sodium chloride flush  3 mL Intravenous Q12H  . Warfarin - Pharmacist Dosing Inpatient   Does not apply q1600   Continuous Infusions: . sodium chloride     PRN Meds: sodium chloride, acetaminophen **OR** acetaminophen, bisacodyl, polyethylene glycol, sodium chloride flush   Vital Signs    Vitals:   08/03/20 1939 08/03/20 2351 08/04/20 0300 08/04/20 0516  BP: 106/74 100/73 (!) 119/59   Pulse: 87 77 81   Resp: 20 19 18    Temp: 98 F (36.7 C) 98.2 F (36.8 C) 98.1 F (36.7 C)   TempSrc: Oral Oral Oral   SpO2: 96% 93% 93%   Weight:    (!) 136.4 kg  Height:        Intake/Output Summary (Last 24 hours) at 08/04/2020 0743 Last data filed at 08/03/2020 1941 Gross per 24 hour  Intake --  Output 1400 ml  Net -1400 ml   Last 3 Weights 08/04/2020 08/03/2020 08/02/2020  Weight (lbs) 300 lb 11.3 oz 305 lb 12.5 oz 310 lb 13.6 oz  Weight (kg) 136.4 kg 138.7 kg 141 kg      Telemetry    Atrial fibrillation - Personally Reviewed  Physical Exam   GEN: No acute distress. Obese  Neck: No JVD Cardiac: irregular Respiratory: Clear to auscultation bilaterally. GI: Soft, nontender, non-distended  MS: No edema Neuro:  Nonfocal  Psych: Normal affect   Labs    High Sensitivity Troponin:    Recent Labs  Lab 07/30/20 1627 07/30/20 1821 07/31/20 0100  TROPONINIHS 76* 83* 94*      Chemistry Recent Labs  Lab 07/30/20 1627 07/30/20 1645 07/31/20 0500 08/01/20 0044 08/02/20 0129 08/03/20 0021 08/04/20 0123  NA 131*   < > 132* 139 139 140 140  K 4.5   < > 3.9 4.0 3.5 3.9 4.1  CL 88*   < > 89* 92* 90* 91* 90*  CO2 32  --  34* 35* 39* 40* 43*  GLUCOSE 124*   < > 93 94 92 100* 99  BUN 14   < > 13 10 10 12 11   CREATININE 0.75   < > 0.70 0.57* 0.65 0.54* 0.58*  CALCIUM 8.4*  --  8.5* 8.5* 8.9 8.6* 8.8*  PROT 6.2*  --  5.3* 5.4*  --   --   --   ALBUMIN 3.1*  --  2.6* 2.7*  --   --   --   AST 25  --  19 20  --   --   --   ALT 29  --  24 25  --   --   --  ALKPHOS 104  --  91 87  --   --   --   BILITOT 0.8  --  1.0 0.9  --   --   --   GFRNONAA >60  --  >60 >60 >60 >60 >60  ANIONGAP 11  --  9 12 10 9 7    < > = values in this interval not displayed.     Hematology Recent Labs  Lab 07/31/20 0500 08/01/20 0044 08/02/20 0129  WBC 8.2 8.9 11.6*  RBC 4.65 4.66 4.92  HGB 14.1 14.5 14.8  HCT 45.0 44.1 47.0  MCV 96.8 94.6 95.5  MCH 30.3 31.1 30.1  MCHC 31.3 32.9 31.5  RDW 14.3 14.3 14.2  PLT 207 229 254    BNP Recent Labs  Lab 07/30/20 1627  BNP 960.3*     DDimer  Recent Labs  Lab 07/30/20 1627  DDIMER 0.57*     Patient Profile     James Villegas is a 65 year old man with prior stroke with residual right hemiparesis, wheelchair-bound nursing home resident, rectus sheath hematoma, coagulopathy, persistent atrial fibrillation on warfarin, ascending aortic aneurysm, hypertension, morbid obesity with obesity hypoventilation syndrome admitted with hypoxic and hypercarbic respiratory failure in the setting of acute on chronic diastolic heart failure and obesity hypoventilation syndrome.   Assessment & Plan    1 acute on chronic diastolic congestive heart failure-I/O -10552 since admission.  Symptomatically improving though remains volume overloaded.  Continue  Lasix at present dose.  Follow renal function.  2 permanent atrial fibrillation-heart rate is controlled.  Continue metoprolol at present dose.  Continue Coumadin with goal INR 2-3.  3 obstructive sleep apnea-we will need outpatient assistance with this issue.  4 ascending aortic aneurysm-patient has follow-up CTA April 2022.  However he would likely not be a good candidate for aortic root replacement.  5 morbid obesity  For questions or updates, please contact [facility] Please consult [email or web address] for contact info under        Signed, May 2022, MD  08/04/2020, 7:43 AM

## 2020-08-04 NOTE — Progress Notes (Signed)
Patient ID: James Villegas, male   DOB: 09-13-1955, 65 y.o.   MRN: [id number]  PROGRESS NOTE    James Villegas  WUJ:[id number] DOB: [birth year] DOA: 07/30/2020 PCP: Karna Dupes, MD   Brief Narrative:  65 year old male with history of unspecified CVA with right-sided weakness, A. fib on Coumadin, hypertension, obesity, bradycardia, chronic hypercapnic respiratory failure, seizure disorder, chronic diastolic CHF, hyperlipidemia presented with altered mental status from his nursing facility.  Patient was poorly responsive on presentation and was hypoxic down to 50% on room air requiring nonrebreather.  ABG showed hypercapnia and patient was started on BiPAP with some improvement in mental status.  He was given intravenous Lasix.  Assessment & Plan:   Acute on chronic respiratory failure with hypoxia and hypercapnia Acute on chronic diastolic CHF Acute metabolic encephalopathy Mild elevated troponins -Presented with altered mental status and hypoxia/hypercapnia requiring BiPAP and intravenous Lasix.  Chest x-ray on presentation showed pulmonary edema.  COVID-19 and flu testing were negative -Mental status has much improved.   -Currently on 4-4.5 L oxygen via nasal cannula -Troponins did not trend up.  Strict input and output.  Daily weights.  Fluid restriction.  Negative balance of 95621 cc since admission -Cardiology following: Currently on Lasix 80 mg IV every 12 hours.  Continue metoprolol -Diet as per SLP recommendations -Patient has had difficulty obtaining outpatient sleep study.  Chronic A. Fib/protein S and protein C deficiency -Continue Coumadin.  Monitor INR -Continue metoprolol as per cardiology -Currently rate controlled  Morbid obesity -Outpatient follow-up  Hyperlipidemia -Continue rosuvastatin  History of seizure disorder -Continue phenytoin.  No recent seizures reported  Generalized deconditioning -Overall prognosis is guarded to poor.  Palliative care  evaluation appreciated. -CODE STATUS has been changed back to DNR after palliative care discussion   DVT prophylaxis: Coumadin Code Status: DNR Family Communication: Spoke to sister on phone on 08/02/20 Disposition Plan: Status is: Inpatient  Remains inpatient appropriate because:Inpatient level of care appropriate due to severity of illness   Dispo: The patient is from: SNF              Anticipated d/c is to: SNF              Anticipated d/c date is: 2 days              Patient currently is not medically stable to d/c.   Difficult to place patient No  Consultants: Cardiology/palliative care  Procedures: None  Antimicrobials: None   Subjective: Patient seen and examined at bedside.  Poor historian.  No worsening shortness of breath, chest pain, nausea, vomiting or fever reported. Objective: Vitals:   08/03/20 1939 08/03/20 2351 08/04/20 0300 08/04/20 0516  BP: 106/74 100/73 (!) 119/59   Pulse: 87 77 81   Resp: 20 19 18    Temp: 98 F (36.7 C) 98.2 F (36.8 C) 98.1 F (36.7 C)   TempSrc: Oral Oral Oral   SpO2: 96% 93% 93%   Weight:    (!) 136.4 kg  Height:        Intake/Output Summary (Last 24 hours) at 08/04/2020 0733 Last data filed at 08/03/2020 1941 Gross per 24 hour  Intake --  Output 1400 ml  Net -1400 ml   Filed Weights   08/02/20 0417 08/03/20 0548 08/04/20 0516  Weight: (!) 141 kg (!) 138.7 kg (!) 136.4 kg    Examination:  General exam: No acute distress.  Looks chronically ill and deconditioned.  Looks older than stated age.  Currently on 4 to 4.5 L oxygen via nasal cannula oxygen  respiratory system: Bilateral decreased breath sounds at bases with scattered crackles cardiovascular system: S1-2 heard, rate controlled gastrointestinal system: Abdomen is morbidly obese, soft, distended and nontender.  Normal bowel sounds heard  extremities: No cyanosis; lower extremity edema present bilaterally  Central nervous system: Awake, still slow to respond but  answers some questions.  Poor historian.  No focal neurological deficits.  Moving extremities skin: No obvious petechiae psychiatry: Flat affect    Data Reviewed: I have personally reviewed following labs and imaging studies  CBC: Recent Labs  Lab 07/30/20 1627 07/30/20 1645 07/31/20 0500 08/01/20 0044 08/02/20 0129  WBC 9.5  --  8.2 8.9 11.6*  NEUTROABS 7.5  --  6.1 6.1 8.7*  HGB 15.1 16.3  16.7 14.1 14.5 14.8  HCT 47.8 48.0  49.0 45.0 44.1 47.0  MCV 96.4  --  96.8 94.6 95.5  PLT 279  --  207 229 254   Basic Metabolic Panel: Recent Labs  Lab 07/31/20 0500 08/01/20 0044 08/02/20 0129 08/03/20 0021 08/04/20 0123  NA 132* 139 139 140 140  K 3.9 4.0 3.5 3.9 4.1  CL 89* 92* 90* 91* 90*  CO2 34* 35* 39* 40* 43*  GLUCOSE 93 94 92 100* 99  BUN 13 10 10 12 11   CREATININE 0.70 0.57* 0.65 0.54* 0.58*  CALCIUM 8.5* 8.5* 8.9 8.6* 8.8*  MG 1.8 1.7 1.9 1.9 2.1  PHOS 3.7  --   --   --   --    GFR: Estimated Creatinine Clearance: 138.8 mL/min (A) (by C-G formula based on SCr of 0.58 mg/dL (L)). Liver Function Tests: Recent Labs  Lab 07/30/20 1627 07/31/20 0500 08/01/20 0044  AST 25 19 20   ALT 29 24 25   ALKPHOS 104 91 87  BILITOT 0.8 1.0 0.9  PROT 6.2* 5.3* 5.4*  ALBUMIN 3.1* 2.6* 2.7*   No results for input(s): LIPASE, AMYLASE in the last 168 hours. No results for input(s): AMMONIA in the last 168 hours. Coagulation Profile: Recent Labs  Lab 07/31/20 0500 08/01/20 0044 08/02/20 0129 08/03/20 0021 08/04/20 0123  INR 1.7* 1.6* 1.4* 1.4* 1.5*   Cardiac Enzymes: No results for input(s): CKTOTAL, CKMB, CKMBINDEX, TROPONINI in the last 168 hours. BNP (last 3 results) No results for input(s): PROBNP in the last 8760 hours. HbA1C: No results for input(s): HGBA1C in the last 72 hours. CBG: Recent Labs  Lab 07/31/20 2203  GLUCAP 106*   Lipid Profile: No results for input(s): CHOL, HDL, LDLCALC, TRIG, CHOLHDL, LDLDIRECT in the last 72 hours. Thyroid Function  Tests: No results for input(s): TSH, T4TOTAL, FREET4, T3FREE, THYROIDAB in the last 72 hours. Anemia Panel: No results for input(s): VITAMINB12, FOLATE, FERRITIN, TIBC, IRON, RETICCTPCT in the last 72 hours. Sepsis Labs: Recent Labs  Lab 07/30/20 1627  PROCALCITON <0.10  LATICACIDVEN 1.0    Recent Results (from the past 240 hour(s))  Resp Panel by RT-PCR (Flu A&B, Covid) Nasopharyngeal Swab     Status: None   Collection Time: 07/30/20  4:27 PM   Specimen: Nasopharyngeal Swab; Nasopharyngeal(NP) swabs in vial transport medium  Result Value Ref Range Status   SARS Coronavirus 2 by RT PCR NEGATIVE NEGATIVE Final    Comment: (NOTE) SARS-CoV-2 target nucleic acids are NOT DETECTED.  The SARS-CoV-2 RNA is generally detectable in upper respiratory specimens during the acute phase of infection. The lowest concentration of SARS-CoV-2 viral copies this assay can detect is 138 copies/mL. A  negative result does not preclude SARS-Cov-2 infection and should not be used as the sole basis for treatment or other patient management decisions. A negative result may occur with  improper specimen collection/handling, submission of specimen other than nasopharyngeal swab, presence of viral mutation(s) within the areas targeted by this assay, and inadequate number of viral copies(<138 copies/mL). A negative result must be combined with clinical observations, patient history, and epidemiological information. The expected result is Negative.  Fact Sheet for Patients:  BloggerCourse.com  Fact Sheet for Healthcare Providers:  SeriousBroker.it  This test is no t yet approved or cleared by the [place] FDA and  has been authorized for detection and/or diagnosis of SARS-CoV-2 by FDA under an Emergency Use Authorization (EUA). This EUA will remain  in effect (meaning this test can be used) for the duration of the COVID-19 declaration under Section  564(b)(1) of the Act, 21 U.S.C.section 360bbb-3(b)(1), unless the authorization is terminated  or revoked sooner.       Influenza A by PCR NEGATIVE NEGATIVE Final   Influenza B by PCR NEGATIVE NEGATIVE Final    Comment: (NOTE) The Xpert Xpress SARS-CoV-2/FLU/RSV plus assay is intended as an aid in the diagnosis of influenza from Nasopharyngeal swab specimens and should not be used as a sole basis for treatment. Nasal washings and aspirates are unacceptable for Xpert Xpress SARS-CoV-2/FLU/RSV testing.  Fact Sheet for Patients: BloggerCourse.com  Fact Sheet for Healthcare Providers: SeriousBroker.it  This test is not yet approved or cleared by the [place] FDA and has been authorized for detection and/or diagnosis of SARS-CoV-2 by FDA under an Emergency Use Authorization (EUA). This EUA will remain in effect (meaning this test can be used) for the duration of the COVID-19 declaration under Section 564(b)(1) of the Act, 21 U.S.C. section 360bbb-3(b)(1), unless the authorization is terminated or revoked.  Performed at [facility], [address], [place], [place] [address]   Blood Culture (routine x 2)     Status: None (Preliminary result)   Collection Time: 07/30/20  4:27 PM   Specimen: BLOOD  Result Value Ref Range Status   Specimen Description BLOOD LEFT ANTECUBITAL  Final   Special Requests   Final    BOTTLES DRAWN AEROBIC AND ANAEROBIC Blood Culture adequate volume   Culture   Final    NO GROWTH 4 DAYS Performed at [facility], [address] [place], [place] [address]    Report Status PENDING  Incomplete  Blood Culture (routine x 2)     Status: None (Preliminary result)   Collection Time: 07/30/20  4:27 PM   Specimen: BLOOD LEFT HAND  Result Value Ref Range Status   Specimen Description BLOOD LEFT HAND  Final   Special Requests   Final    BOTTLES DRAWN AEROBIC AND ANAEROBIC Blood Culture results  may not be optimal due to an inadequate volume of blood received in culture bottles   Culture   Final    NO GROWTH 4 DAYS Performed at [facility], [address] [place], [place] [address]    Report Status PENDING  Incomplete  MRSA PCR Screening     Status: None   Collection Time: 07/31/20 10:39 PM   Specimen: Nasal Mucosa; Nasopharyngeal  Result Value Ref Range Status   MRSA by PCR NEGATIVE NEGATIVE Final    Comment:        The GeneXpert MRSA Assay (FDA approved for NASAL specimens only), is one component of a comprehensive MRSA  colonization surveillance program. It is not intended to diagnose MRSA infection nor to guide or monitor treatment for MRSA infections. Performed at [facility], [address] [place], [place] [address]          Radiology Studies: No results found.      Scheduled Meds: . cycloSPORINE  1 drop Both Eyes BID  . ezetimibe  10 mg Oral Daily  . feeding supplement  237 mL Oral Q24H  . furosemide  80 mg Intravenous Q12H  . magnesium oxide  400 mg Oral Daily  . metoprolol tartrate  12.5 mg Oral BID  . multivitamin with minerals  1 tablet Oral Daily  . mupirocin cream   Topical BID  . pantoprazole  40 mg Oral Daily  . phenytoin  200 mg Oral BID WC  . potassium chloride  40 mEq Oral Daily  . rosuvastatin  40 mg Oral QHS  . senna  1 tablet Oral BID  . sodium chloride flush  3 mL Intravenous Q12H  . Warfarin - Pharmacist Dosing Inpatient   Does not apply q1600   Continuous Infusions: . sodium chloride            Glade Lloyd, MD [facility] 08/04/2020, 7:33 AM

## 2020-08-04 NOTE — Progress Notes (Signed)
ANTICOAGULATION CONSULT NOTE - Follow Up Consult  Pharmacy Consult for Coumadin Indication: afib and CVA, protein S and protein C deficiency.  Allergies  Allergen Reactions  . Fenofibrate Other (See Comments)    Per [facility]    Patient Measurements: Height: 6\' 1"  (185.4 cm) Weight: (!) 136.4 kg (300 lb 11.3 oz) IBW/kg (Calculated) : 79.9  Vital Signs: Temp: 98.2 F (36.8 C) (02/26 0910) Temp Source: Oral (02/26 0910) BP: 119/73 (02/26 0910) Pulse Rate: 93 (02/26 0910)  Labs: Recent Labs    08/02/20 0129 08/03/20 0021 08/04/20 0123  HGB 14.8  --   --   HCT 47.0  --   --   PLT 254  --   --   LABPROT 16.9* 16.3* 17.4*  INR 1.4* 1.4* 1.5*  CREATININE 0.65 0.54* 0.58*    Estimated Creatinine Clearance: 138.8 mL/min (A) (by C-G formula based on SCr of 0.58 mg/dL (L)).   Assessment: Anticoag: warfarin pta for afib and CVA, protein S and protein C deficiency. Missed doses 2/20 and 2/21. INR 1.5. -PTA dose - 3mg  daily (last 2/19 PTA). Admit INR 1.7  Goal of Therapy:  INR 2-3 Monitor platelets by anticoagulation protocol: Yes   Plan:  -Warf 6mg  po x 1 tonight. Daily INR   Lealer Marsland S. , PharmD, BCPS Clinical Staff Pharmacist [email or web address] 3/19 [place] 08/04/2020,12:29 PM

## 2020-08-05 LAB — PROTIME-INR
INR: 1.6 — ABNORMAL HIGH (ref 0.8–1.2)
Prothrombin Time: 18.5 seconds — ABNORMAL HIGH (ref 11.4–15.2)

## 2020-08-05 LAB — MAGNESIUM: Magnesium: 2.3 mg/dL (ref 1.7–2.4)

## 2020-08-05 LAB — BASIC METABOLIC PANEL
Anion gap: 9 (ref 5–15)
BUN: 15 mg/dL (ref 8–23)
CO2: 42 mmol/L — ABNORMAL HIGH (ref 22–32)
Calcium: 8.6 mg/dL — ABNORMAL LOW (ref 8.9–10.3)
Chloride: 86 mmol/L — ABNORMAL LOW (ref 98–111)
Creatinine, Ser: 0.55 mg/dL — ABNORMAL LOW (ref 0.61–1.24)
GFR, Estimated: >60 mL/min (ref >60)
Glucose, Bld: 110 mg/dL — ABNORMAL HIGH (ref 70–99)
Potassium: 4.1 mmol/L (ref 3.5–5.1)
Sodium: 137 mmol/L (ref 135–145)

## 2020-08-05 MED ORDER — WARFARIN SODIUM 7.5 MG PO TABS
7.5000 mg | ORAL_TABLET | Freq: Once | ORAL | Status: AC
  Administered 2020-08-05: 7.5 mg via ORAL
  Filled 2020-08-05: qty 1

## 2020-08-05 NOTE — Progress Notes (Signed)
Progress Note  Patient Name: James Villegas Date of Encounter: 08/05/2020  [facility] Cardiologist: James Alexander, MD   Subjective   Pt denies CP; no dyspnea  Inpatient Medications    Scheduled Meds: . cycloSPORINE  1 drop Both Eyes BID  . ezetimibe  10 mg Oral Daily  . feeding supplement  237 mL Oral Q24H  . furosemide  80 mg Intravenous Q12H  . magnesium oxide  400 mg Oral Daily  . metoprolol tartrate  12.5 mg Oral BID  . multivitamin with minerals  1 tablet Oral Daily  . mupirocin cream   Topical BID  . pantoprazole  40 mg Oral Daily  . phenytoin  200 mg Oral BID WC  . potassium chloride  40 mEq Oral Daily  . rosuvastatin  40 mg Oral QHS  . senna  1 tablet Oral BID  . sodium chloride flush  3 mL Intravenous Q12H  . Warfarin - Pharmacist Dosing Inpatient   Does not apply q1600   Continuous Infusions: . sodium chloride     PRN Meds: sodium chloride, acetaminophen **OR** acetaminophen, bisacodyl, polyethylene glycol, sodium chloride flush   Vital Signs    Vitals:   08/05/20 0224 08/05/20 0425 08/05/20 0427 08/05/20 0623  BP: 122/73 113/66    Pulse: 73 85 87 84  Resp: 20 16 (!) 22 18  Temp: 98.3 F (36.8 C)     TempSrc: Oral     SpO2: 95% (!) 88% 97% 95%  Weight: (!) 136.4 kg     Height:        Intake/Output Summary (Last 24 hours) at 08/05/2020 0711 Last data filed at 08/05/2020 6063 Gross per 24 hour  Intake 720 ml  Output 1850 ml  Net -1130 ml   Last 3 Weights 08/05/2020 08/04/2020 08/03/2020  Weight (lbs) 300 lb 11.3 oz 300 lb 11.3 oz 305 lb 12.5 oz  Weight (kg) 136.4 kg 136.4 kg 138.7 kg      Telemetry    Atrial fibrillation, rate controlled - Personally Reviewed  Physical Exam   GEN: No acute distress. WD Obese  Neck: Supple Cardiac: irregular, no murmur Respiratory: CTA GI: Soft, obese  MS: No edema Neuro:  hemiparetic Psych: Normal affect   Labs    High Sensitivity Troponin:   Recent Labs  Lab 07/30/20 1627 07/30/20 1821  07/31/20 0100  TROPONINIHS 76* 83* 94*      Chemistry Recent Labs  Lab 07/30/20 1627 07/30/20 1645 07/31/20 0500 08/01/20 0044 08/02/20 0129 08/03/20 0021 08/04/20 0123 08/05/20 0103  NA 131*   < > 132* 139   < > 140 140 137  K 4.5   < > 3.9 4.0   < > 3.9 4.1 4.1  CL 88*   < > 89* 92*   < > 91* 90* 86*  CO2 32  --  34* 35*   < > 40* 43* 42*  GLUCOSE 124*   < > 93 94   < > 100* 99 110*  BUN 14   < > 13 10   < > 12 11 15   CREATININE 0.75   < > 0.70 0.57*   < > 0.54* 0.58* 0.55*  CALCIUM 8.4*  --  8.5* 8.5*   < > 8.6* 8.8* 8.6*  PROT 6.2*  --  5.3* 5.4*  --   --   --   --   ALBUMIN 3.1*  --  2.6* 2.7*  --   --   --   --  AST 25  --  19 20  --   --   --   --   ALT 29  --  24 25  --   --   --   --   ALKPHOS 104  --  91 87  --   --   --   --   BILITOT 0.8  --  1.0 0.9  --   --   --   --   GFRNONAA >60  --  >60 >60   < > >60 >60 >60  ANIONGAP 11  --  9 12   < > 9 7 9    < > = values in this interval not displayed.     Hematology Recent Labs  Lab 07/31/20 0500 08/01/20 0044 08/02/20 0129  WBC 8.2 8.9 11.6*  RBC 4.65 4.66 4.92  HGB 14.1 14.5 14.8  HCT 45.0 44.1 47.0  MCV 96.8 94.6 95.5  MCH 30.3 31.1 30.1  MCHC 31.3 32.9 31.5  RDW 14.3 14.3 14.2  PLT 207 229 254    BNP Recent Labs  Lab 07/30/20 1627  BNP 960.3*     DDimer  Recent Labs  Lab 07/30/20 1627  DDIMER 0.57*     Patient Profile     James Villegas is a 65 year old man with prior stroke with residual right hemiparesis, wheelchair-bound nursing home resident, rectus sheath hematoma, coagulopathy, persistent atrial fibrillation on warfarin, ascending aortic aneurysm, hypertension, morbid obesity with obesity hypoventilation syndrome admitted with hypoxic and hypercarbic respiratory failure in the setting of acute on chronic diastolic heart failure and obesity hypoventilation syndrome.   Assessment & Plan    1 acute on chronic diastolic congestive heart failure-I/O -1130.  Wt 136.4 kg. Symptoms  continue to improve.  We will continue IV Lasix today and likely transition to oral Demadex tomorrow.  Continue to follow renal function.  2 permanent atrial fibrillation-heart rate is controlled.  Continue metoprolol.  Continue Coumadin with goal INR 2-3.  3 obstructive sleep apnea-we will need outpatient assistance with this issue.  4 ascending aortic aneurysm-patient has follow-up CTA April 2022.  However he would likely not be a good candidate for aortic root replacement.  5 morbid obesity  For questions or updates, please contact [facility] Please consult [email or web address] for contact info under        Signed, May 2022, MD  08/05/2020, 7:11 AM

## 2020-08-05 NOTE — Progress Notes (Signed)
ANTICOAGULATION CONSULT NOTE - Follow Up Consult  Pharmacy Consult for Coumadin Indication: afib and CVA, protein S and protein C deficiency.  Allergies  Allergen Reactions  . Fenofibrate Other (See Comments)    Per [facility]    Patient Measurements: Height: 6\' 1"  (185.4 cm) Weight: (!) 136.4 kg (300 lb 11.3 oz) IBW/kg (Calculated) : 79.9  Vital Signs: Temp: 98.5 F (36.9 C) (02/27 0803) Temp Source: Oral (02/27 0803) BP: 130/97 (02/27 0803) Pulse Rate: 84 (02/27 0803)  Labs: Recent Labs    08/03/20 0021 08/04/20 0123 08/05/20 0103  LABPROT 16.3* 17.4* 18.5*  INR 1.4* 1.5* 1.6*  CREATININE 0.54* 0.58* 0.55*    Estimated Creatinine Clearance: 138.8 mL/min (A) (by C-G formula based on SCr of 0.55 mg/dL (L)).   Assessment: Anticoag: warfarin pta for afib and CVA, protein S and protein C deficiency. Missed doses 2/20 and 2/21. INR 1.6. -PTA dose - 3mg  daily (last 2/19 PTA). Admit INR 1.7  Goal of Therapy:  INR 2-3 Monitor platelets by anticoagulation protocol: Yes   Plan:  -Warf 7.5mg  po x 1 tonight. Requiring much higher doses than PTA. Daily INR   Teryn Gust S. , PharmD, BCPS Clinical Staff Pharmacist [email or web address] 3/19, Merilynn Finland 08/05/2020,10:15 AM

## 2020-08-05 NOTE — Plan of Care (Signed)

## 2020-08-05 NOTE — Progress Notes (Signed)
Patient ID: James Villegas, male   DOB: 1956-05-03, 65 y.o.   MRN: [id number]  PROGRESS NOTE    JAISON PETRAGLIA  AJO:[id number] DOB: May 17, 1956 DOA: 07/30/2020 PCP: Karna Dupes, MD   Brief Narrative:  65 year old male with history of unspecified CVA with right-sided weakness, A. fib on Coumadin, hypertension, obesity, bradycardia, chronic hypercapnic respiratory failure, seizure disorder, chronic diastolic CHF, hyperlipidemia presented with altered mental status from his nursing facility.  Patient was poorly responsive on presentation and was hypoxic down to 50% on room air requiring nonrebreather.  ABG showed hypercapnia and patient was started on BiPAP with some improvement in mental status.  He was given intravenous Lasix.  Assessment & Plan:   Acute on chronic respiratory failure with hypoxia and hypercapnia Acute on chronic diastolic CHF Acute metabolic encephalopathy Mild elevated troponins -Presented with altered mental status and hypoxia/hypercapnia requiring BiPAP and intravenous Lasix.  Chest x-ray on presentation showed pulmonary edema.  COVID-19 and flu testing were negative -Mental status has much improved.   -Currently on 4L oxygen via nasal cannula -Troponins did not trend up.  Strict input and output.  Daily weights.  Fluid restriction.  Negative balance of 94709 cc since admission -Cardiology following: Currently on Lasix 80 mg IV every 12 hours.  Continue metoprolol -Diet as per SLP recommendations -Patient has had difficulty obtaining outpatient sleep study.  Chronic A. Fib/protein S and protein C deficiency -Continue Coumadin.  Monitor INR. Still subtherapeutic -Continue metoprolol as per cardiology -Currently rate controlled  Morbid obesity -Outpatient follow-up  Hyperlipidemia -Continue rosuvastatin  History of seizure disorder -Continue phenytoin.  No recent seizures reported  Generalized deconditioning -Overall prognosis is guarded to poor.   Palliative care evaluation appreciated. -CODE STATUS has been changed back to DNR after palliative care discussion   DVT prophylaxis: Coumadin Code Status: DNR Family Communication: Spoke to sister on phone on 08/02/20 Disposition Plan: Status is: Inpatient  Remains inpatient appropriate because:Inpatient level of care appropriate due to severity of illness   Dispo: The patient is from: SNF              Anticipated d/c is to: SNF              Anticipated d/c date is: 2 days              Patient currently is not medically stable to d/c.   Difficult to place patient No  Consultants: Cardiology/palliative care  Procedures: None  Antimicrobials: None   Subjective: Patient seen and examined at bedside.  Poor historian. Denies any overnight fever, vomiting, abdominal pain, diarrhea or chest pain.  Objective: Vitals:   08/05/20 0224 08/05/20 0425 08/05/20 0427 08/05/20 0623  BP: 122/73 113/66    Pulse: 73 85 87 84  Resp: 20 16 (!) 22 18  Temp: 98.3 F (36.8 C)     TempSrc: Oral     SpO2: 95% (!) 88% 97% 95%  Weight: (!) 136.4 kg     Height:        Intake/Output Summary (Last 24 hours) at 08/05/2020 0735 Last data filed at 08/05/2020 6283 Gross per 24 hour  Intake 720 ml  Output 1850 ml  Net -1130 ml   Filed Weights   08/03/20 0548 08/04/20 0516 08/05/20 0224  Weight: (!) 138.7 kg (!) 136.4 kg (!) 136.4 kg    Examination:  General exam:  Looks chronically ill and deconditioned.  Looks older than stated age. On 4 L nasal cannula currently. No acute  distress. Poor historian Respiratory system: Decreased breath sounds at bases bilaterally with some crackles  cardiovascular system: S1-2 heard, rate controlled gastrointestinal system: Abdomen is morbidly obese, soft, distended and nontender. Bowel sounds are heard extremities: Bilateral lower extremity edema present; no cyanosis    Data Reviewed: I have personally reviewed following labs and imaging  studies  CBC: Recent Labs  Lab 07/30/20 1627 07/30/20 1645 07/31/20 0500 08/01/20 0044 08/02/20 0129  WBC 9.5  --  8.2 8.9 11.6*  NEUTROABS 7.5  --  6.1 6.1 8.7*  HGB 15.1 16.3  16.7 14.1 14.5 14.8  HCT 47.8 48.0  49.0 45.0 44.1 47.0  MCV 96.4  --  96.8 94.6 95.5  PLT 279  --  207 229 254   Basic Metabolic Panel: Recent Labs  Lab 07/31/20 0500 08/01/20 0044 08/02/20 0129 08/03/20 0021 08/04/20 0123 08/05/20 0103  NA 132* 139 139 140 140 137  K 3.9 4.0 3.5 3.9 4.1 4.1  CL 89* 92* 90* 91* 90* 86*  CO2 34* 35* 39* 40* 43* 42*  GLUCOSE 93 94 92 100* 99 110*  BUN 13 10 10 12 11 15   CREATININE 0.70 0.57* 0.65 0.54* 0.58* 0.55*  CALCIUM 8.5* 8.5* 8.9 8.6* 8.8* 8.6*  MG 1.8 1.7 1.9 1.9 2.1 2.3  PHOS 3.7  --   --   --   --   --    GFR: Estimated Creatinine Clearance: 138.8 mL/min (A) (by C-G formula based on SCr of 0.55 mg/dL (L)). Liver Function Tests: Recent Labs  Lab 07/30/20 1627 07/31/20 0500 08/01/20 0044  AST 25 19 20   ALT 29 24 25   ALKPHOS 104 91 87  BILITOT 0.8 1.0 0.9  PROT 6.2* 5.3* 5.4*  ALBUMIN 3.1* 2.6* 2.7*   No results for input(s): LIPASE, AMYLASE in the last 168 hours. No results for input(s): AMMONIA in the last 168 hours. Coagulation Profile: Recent Labs  Lab 08/01/20 0044 08/02/20 0129 08/03/20 0021 08/04/20 0123 08/05/20 0103  INR 1.6* 1.4* 1.4* 1.5* 1.6*   Cardiac Enzymes: No results for input(s): CKTOTAL, CKMB, CKMBINDEX, TROPONINI in the last 168 hours. BNP (last 3 results) No results for input(s): PROBNP in the last 8760 hours. HbA1C: No results for input(s): HGBA1C in the last 72 hours. CBG: Recent Labs  Lab 07/31/20 2203  GLUCAP 106*   Lipid Profile: No results for input(s): CHOL, HDL, LDLCALC, TRIG, CHOLHDL, LDLDIRECT in the last 72 hours. Thyroid Function Tests: No results for input(s): TSH, T4TOTAL, FREET4, T3FREE, THYROIDAB in the last 72 hours. Anemia Panel: No results for input(s): VITAMINB12, FOLATE,  FERRITIN, TIBC, IRON, RETICCTPCT in the last 72 hours. Sepsis Labs: Recent Labs  Lab 07/30/20 1627  PROCALCITON <0.10  LATICACIDVEN 1.0    Recent Results (from the past 240 hour(s))  Resp Panel by RT-PCR (Flu A&B, Covid) Nasopharyngeal Swab     Status: None   Collection Time: 07/30/20  4:27 PM   Specimen: Nasopharyngeal Swab; Nasopharyngeal(NP) swabs in vial transport medium  Result Value Ref Range Status   SARS Coronavirus 2 by RT PCR NEGATIVE NEGATIVE Final    Comment: (NOTE) SARS-CoV-2 target nucleic acids are NOT DETECTED.  The SARS-CoV-2 RNA is generally detectable in upper respiratory specimens during the acute phase of infection. The lowest concentration of SARS-CoV-2 viral copies this assay can detect is 138 copies/mL. A negative result does not preclude SARS-Cov-2 infection and should not be used as the sole basis for treatment or other patient management decisions. A negative result may  occur with  improper specimen collection/handling, submission of specimen other than nasopharyngeal swab, presence of viral mutation(s) within the areas targeted by this assay, and inadequate number of viral copies(<138 copies/mL). A negative result must be combined with clinical observations, patient history, and epidemiological information. The expected result is Negative.  Fact Sheet for Patients:  BloggerCourse.com  Fact Sheet for Healthcare Providers:  SeriousBroker.it  This test is no t yet approved or cleared by the [place] FDA and  has been authorized for detection and/or diagnosis of SARS-CoV-2 by FDA under an Emergency Use Authorization (EUA). This EUA will remain  in effect (meaning this test can be used) for the duration of the COVID-19 declaration under Section 564(b)(1) of the Act, 21 U.S.C.section 360bbb-3(b)(1), unless the authorization is terminated  or revoked sooner.       Influenza A by PCR NEGATIVE  NEGATIVE Final   Influenza B by PCR NEGATIVE NEGATIVE Final    Comment: (NOTE) The Xpert Xpress SARS-CoV-2/FLU/RSV plus assay is intended as an aid in the diagnosis of influenza from Nasopharyngeal swab specimens and should not be used as a sole basis for treatment. Nasal washings and aspirates are unacceptable for Xpert Xpress SARS-CoV-2/FLU/RSV testing.  Fact Sheet for Patients: BloggerCourse.com  Fact Sheet for Healthcare Providers: SeriousBroker.it  This test is not yet approved or cleared by the [place] FDA and has been authorized for detection and/or diagnosis of SARS-CoV-2 by FDA under an Emergency Use Authorization (EUA). This EUA will remain in effect (meaning this test can be used) for the duration of the COVID-19 declaration under Section 564(b)(1) of the Act, 21 U.S.C. section 360bbb-3(b)(1), unless the authorization is terminated or revoked.  Performed at [facility], [address] [place], [place] [address]   Blood Culture (routine x 2)     Status: None   Collection Time: 07/30/20  4:27 PM   Specimen: BLOOD  Result Value Ref Range Status   Specimen Description BLOOD LEFT ANTECUBITAL  Final   Special Requests   Final    BOTTLES DRAWN AEROBIC AND ANAEROBIC Blood Culture adequate volume   Culture   Final    NO GROWTH 5 DAYS Performed at [facility], [address] [place], [place] [address]    Report Status 08/04/2020 FINAL  Final  Blood Culture (routine x 2)     Status: None   Collection Time: 07/30/20  4:27 PM   Specimen: BLOOD LEFT HAND  Result Value Ref Range Status   Specimen Description BLOOD LEFT HAND  Final   Special Requests   Final    BOTTLES DRAWN AEROBIC AND ANAEROBIC Blood Culture results may not be optimal due to an inadequate volume of blood received in culture bottles   Culture   Final    NO GROWTH 5 DAYS Performed at [facility], [address], [place],  [place] [address]    Report Status 08/04/2020 FINAL  Final  MRSA PCR Screening     Status: None   Collection Time: 07/31/20 10:39 PM   Specimen: Nasal Mucosa; Nasopharyngeal  Result Value Ref Range Status   MRSA by PCR NEGATIVE NEGATIVE Final    Comment:        The GeneXpert MRSA Assay (FDA approved for NASAL specimens only), is one component of a comprehensive MRSA colonization surveillance program. It is not intended to diagnose MRSA infection nor to guide or monitor treatment for MRSA infections. Performed at [facility], [address] [place], [place] [address]          Radiology Studies: No results found.      Scheduled Meds: . cycloSPORINE  1 drop Both Eyes BID  . ezetimibe  10 mg Oral Daily  . feeding supplement  237 mL Oral Q24H  . furosemide  80 mg Intravenous Q12H  . magnesium oxide  400 mg Oral Daily  . metoprolol tartrate  12.5 mg Oral BID  . multivitamin with minerals  1 tablet Oral Daily  . mupirocin cream   Topical BID  . pantoprazole  40 mg Oral Daily  . phenytoin  200 mg Oral BID WC  . potassium chloride  40 mEq Oral Daily  . rosuvastatin  40 mg Oral QHS  . senna  1 tablet Oral BID  . sodium chloride flush  3 mL Intravenous Q12H  . Warfarin - Pharmacist Dosing Inpatient   Does not apply q1600   Continuous Infusions: . sodium chloride            Glade LloydKshitiz Adithi Gammon, MD [facility] 08/05/2020, 7:35 AM

## 2020-08-06 LAB — BASIC METABOLIC PANEL
Anion gap: 6 (ref 5–15)
BUN: 13 mg/dL (ref 8–23)
CO2: 45 mmol/L — ABNORMAL HIGH (ref 22–32)
Calcium: 9 mg/dL (ref 8.9–10.3)
Chloride: 88 mmol/L — ABNORMAL LOW (ref 98–111)
Creatinine, Ser: 0.53 mg/dL — ABNORMAL LOW (ref 0.61–1.24)
GFR, Estimated: >60 mL/min (ref >60)
Glucose, Bld: 94 mg/dL (ref 70–99)
Potassium: 4.1 mmol/L (ref 3.5–5.1)
Sodium: 139 mmol/L (ref 135–145)

## 2020-08-06 LAB — PROTIME-INR
INR: 1.6 — ABNORMAL HIGH (ref 0.8–1.2)
Prothrombin Time: 18.6 seconds — ABNORMAL HIGH (ref 11.4–15.2)

## 2020-08-06 LAB — SARS CORONAVIRUS 2 (TAT 6-24 HRS): SARS Coronavirus 2: NEGATIVE

## 2020-08-06 LAB — MAGNESIUM: Magnesium: 2.3 mg/dL (ref 1.7–2.4)

## 2020-08-06 MED ORDER — WARFARIN SODIUM 7.5 MG PO TABS
7.5000 mg | ORAL_TABLET | Freq: Once | ORAL | Status: AC
  Administered 2020-08-06: 7.5 mg via ORAL
  Filled 2020-08-06: qty 1

## 2020-08-06 MED ORDER — ENOXAPARIN SODIUM 150 MG/ML ~~LOC~~ SOLN
1.0000 mg/kg | Freq: Two times a day (BID) | SUBCUTANEOUS | Status: DC
  Administered 2020-08-06 – 2020-08-07 (×3): 135 mg via SUBCUTANEOUS
  Filled 2020-08-06 (×4): qty 0.9

## 2020-08-06 MED ORDER — DIPHENHYDRAMINE HCL 25 MG PO CAPS
50.0000 mg | ORAL_CAPSULE | Freq: Four times a day (QID) | ORAL | Status: DC | PRN
  Administered 2020-08-06: 50 mg via ORAL
  Filled 2020-08-06: qty 2

## 2020-08-06 MED ORDER — TORSEMIDE 20 MG PO TABS
20.0000 mg | ORAL_TABLET | Freq: Two times a day (BID) | ORAL | Status: DC
  Administered 2020-08-06 – 2020-08-07 (×3): 20 mg via ORAL
  Filled 2020-08-06 (×3): qty 1

## 2020-08-06 NOTE — Progress Notes (Signed)
ANTICOAGULATION CONSULT NOTE   Pharmacy Consult for Warfarin Indication: atrial fibrillation  Allergies  Allergen Reactions  . Fenofibrate Other (See Comments)    Per [facility]    Patient Measurements: Height: 6\' 1"  (185.4 cm) Weight: 134.1 kg (295 lb 10.2 oz) IBW/kg (Calculated) : 79.9  Vital Signs: Temp: 97.8 F (36.6 C) (02/28 0419) Temp Source: Oral (02/28 0419) BP: 119/69 (02/28 0419) Pulse Rate: 89 (02/28 0419)  Labs: Recent Labs    08/04/20 0123 08/05/20 0103 08/06/20 0042  LABPROT 17.4* 18.5* 18.6*  INR 1.5* 1.6* 1.6*  CREATININE 0.58* 0.55* 0.53*    Estimated Creatinine Clearance: 137.6 mL/min (A) (by C-G formula based on SCr of 0.53 mg/dL (L)).   Medical History: Past Medical History:  Diagnosis Date  . Acute on chronic congestive heart failure (HCC)   . Anemia, secondary    SECONDARY TO ACUTE BLOOD LOSS  . Anxiety disorder   . Bloody stool 08/29/2011   intermittent along with constipation.   . 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 Depression   . 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 Dysphagia   . Hyperlipidemia   . Hypertension   . Lupus anticoagulant disorder (HCC) 1990  . Morbid obesity (HCC)   . PFO (patent foramen ovale)    TEE 2/10: EF 60%, trivial AI, mild Ao root dilatation, mod PFO with R-L shunting, atrial septal aneurysm;   echo 7/12: EF 65-70%, grade 1 diast dysfxn, mild MR, LVOT showed severe obstruction  . Protein C deficiency (HCC)   . Protein S deficiency (HCC)   . Rectus sheath hematoma 7/12   required reversal of anticoagulation and c/b DVT req. IVC filter  . Seizure disorder (HCC)     Medications:  Scheduled:  . cycloSPORINE  1 drop Both Eyes BID  . ezetimibe  10 mg Oral Daily  . feeding supplement  237 mL Oral Q24H  . magnesium oxide  400 mg Oral Daily  . metoprolol tartrate  12.5 mg Oral BID  . multivitamin with  minerals  1 tablet Oral Daily  . mupirocin cream   Topical BID  . pantoprazole  40 mg Oral Daily  . phenytoin  200 mg Oral BID WC  . potassium chloride  40 mEq Oral Daily  . rosuvastatin  40 mg Oral QHS  . senna  1 tablet Oral BID  . sodium chloride flush  3 mL Intravenous Q12H  . torsemide  20 mg Oral BID  . Warfarin - Pharmacist Dosing Inpatient   Does not apply q1600    Assessment: 41 yom admitted for AMS, on warfarin PTA for afib with last dose appearing to be 2/19 PTA. The nursing home [facility] is difficult to read with multiple changes in warfarin order. Most recent PTA regimen appears to be warfarin 3mg  daily. INR subtherapeutic at 1.7 on admit. Patient continues on phenytoin as PTA for hx of seizures (lvl therapeutic on admit).  INR remains subtherapeutic at 1.6 but is now trending up. Pharmacy asked to add enoxaparin bridge with hx CVA.  Goal of Therapy:  INR 2-3 Monitor platelets by anticoagulation protocol: Yes   Plan:  Warfarin 7.5mg  PO x1 tonight Enoxaparin 1mg /kg q12h until INR >2 Daily protime, CBC tmrw   [facility], PharmD, BCPS, [facility] Clinical Pharmacist [phone] Please check AMION for all [facility] Pharmacy numbers 08/06/2020

## 2020-08-06 NOTE — Progress Notes (Signed)
Patient ID: James Villegas, male   DOB: [birth year], 65 y.o.   MRN: [id number]  PROGRESS NOTE    BRAYLYNN GHAN  YKD:[id number] DOB: 15-Mar-1956 DOA: 07/30/2020 PCP: Karna Dupes, MD   Brief Narrative:  65 year old male with history of unspecified CVA with right-sided weakness, A. fib on Coumadin, hypertension, obesity, bradycardia, chronic hypercapnic respiratory failure, seizure disorder, chronic diastolic CHF, hyperlipidemia presented with altered mental status from his nursing facility.  Patient was poorly responsive on presentation and was hypoxic down to 50% on room air requiring nonrebreather.  ABG showed hypercapnia and patient was started on BiPAP with some improvement in mental status.  He was given intravenous Lasix.  Assessment & Plan:   Acute on chronic respiratory failure with hypoxia and hypercapnia Acute on chronic diastolic CHF Acute metabolic encephalopathy Mild elevated troponins -Presented with altered mental status and hypoxia/hypercapnia requiring BiPAP and intravenous Lasix.  Chest x-ray on presentation showed pulmonary edema.  COVID-19 and flu testing were negative -Mental status has much improved.   -Currently on 4L oxygen via nasal cannula -Troponins did not trend up.  Strict input and output.  Daily weights.  Fluid restriction.  Negative balance of 39767 cc since admission -Cardiology following: Cardiology has with the patient to oral Demadex this morning and has signed off.  Continue metoprolol -Diet as per SLP recommendations -Patient has had difficulty obtaining outpatient sleep study.  Chronic A. Fib/protein S and protein C deficiency -Continue Coumadin.  Monitor INR. Still subtherapeutic -Continue metoprolol as per cardiology -Currently rate controlled  Morbid obesity -Outpatient follow-up  Hyperlipidemia -Continue rosuvastatin  History of seizure disorder -Continue phenytoin.  No recent seizures reported  Generalized deconditioning -Overall  prognosis is guarded to poor.  Palliative care evaluation appreciated. -CODE STATUS has been changed back to DNR after palliative care discussion   DVT prophylaxis: Coumadin Code Status: DNR Family Communication: Spoke to sister on phone on 08/02/20 Disposition Plan: Status is: Inpatient  Remains inpatient appropriate because:Inpatient level of care appropriate due to severity of illness.  Possible discharge tomorrow if remains stable on oral diuretics today.   Dispo: The patient is from: SNF              Anticipated d/c is to: SNF              Anticipated d/c date is: 1 day              Patient currently is not medically stable to d/c.   Difficult to place patient No  Consultants: Cardiology/palliative care  Procedures: None  Antimicrobials: None   Subjective: Patient seen and examined at bedside.  Poor historian.  Denies chest pain, fever, nausea, vomiting.  Shortness of breath is improving.   Objective: Vitals:   08/05/20 1725 08/05/20 1928 08/06/20 0017 08/06/20 0419  BP:  107/65 117/67 119/69  Pulse:  85 81 89  Resp: 16 20 20 17   Temp:  98.2 F (36.8 C) 98.6 F (37 C) 97.8 F (36.6 C)  TempSrc:  Oral Oral Oral  SpO2:  93% 92% 96%  Weight:    134.1 kg  Height:        Intake/Output Summary (Last 24 hours) at 08/06/2020 0714 Last data filed at 08/05/2020 2300 Gross per 24 hour  Intake 840 ml  Output 2000 ml  Net -1160 ml   Filed Weights   08/04/20 0516 08/05/20 0224 08/06/20 0419  Weight: (!) 136.4 kg (!) 136.4 kg 134.1 kg    Examination:  General  exam:  Looks chronically ill and deconditioned.  Looks older than stated age.  No distress.  Still on 4 L oxygen via nasal cannula.  Poor historian Respiratory system: Bilateral decreased breath sounds at bases with basilar crackles  cardiovascular system: Rate controlled, S1-S2 heard  gastrointestinal system: Abdomen is morbidly obese, soft, distended and nontender.  Normal bowel sounds heard  extremities: No  clubbing; lower extremity edema present bilaterally    Data Reviewed: I have personally reviewed following labs and imaging studies  CBC: Recent Labs  Lab 07/30/20 1627 07/30/20 1645 07/31/20 0500 08/01/20 0044 08/02/20 0129  WBC 9.5  --  8.2 8.9 11.6*  NEUTROABS 7.5  --  6.1 6.1 8.7*  HGB 15.1 16.3  16.7 14.1 14.5 14.8  HCT 47.8 48.0  49.0 45.0 44.1 47.0  MCV 96.4  --  96.8 94.6 95.5  PLT 279  --  207 229 254   Basic Metabolic Panel: Recent Labs  Lab 07/31/20 0500 08/01/20 0044 08/02/20 0129 08/03/20 0021 08/04/20 0123 08/05/20 0103 08/06/20 0042  NA 132*   < > 139 140 140 137 139  K 3.9   < > 3.5 3.9 4.1 4.1 4.1  CL 89*   < > 90* 91* 90* 86* 88*  CO2 34*   < > 39* 40* 43* 42* 45*  GLUCOSE 93   < > 92 100* 99 110* 94  BUN 13   < > 10 12 11 15 13   CREATININE 0.70   < > 0.65 0.54* 0.58* 0.55* 0.53*  CALCIUM 8.5*   < > 8.9 8.6* 8.8* 8.6* 9.0  MG 1.8   < > 1.9 1.9 2.1 2.3 2.3  PHOS 3.7  --   --   --   --   --   --    < > = values in this interval not displayed.   GFR: Estimated Creatinine Clearance: 137.6 mL/min (A) (by C-G formula based on SCr of 0.53 mg/dL (L)). Liver Function Tests: Recent Labs  Lab 07/30/20 1627 07/31/20 0500 08/01/20 0044  AST 25 19 20   ALT 29 24 25   ALKPHOS 104 91 87  BILITOT 0.8 1.0 0.9  PROT 6.2* 5.3* 5.4*  ALBUMIN 3.1* 2.6* 2.7*   No results for input(s): LIPASE, AMYLASE in the last 168 hours. No results for input(s): AMMONIA in the last 168 hours. Coagulation Profile: Recent Labs  Lab 08/02/20 0129 08/03/20 0021 08/04/20 0123 08/05/20 0103 08/06/20 0042  INR 1.4* 1.4* 1.5* 1.6* 1.6*   Cardiac Enzymes: No results for input(s): CKTOTAL, CKMB, CKMBINDEX, TROPONINI in the last 168 hours. BNP (last 3 results) No results for input(s): PROBNP in the last 8760 hours. HbA1C: No results for input(s): HGBA1C in the last 72 hours. CBG: Recent Labs  Lab 07/31/20 2203  GLUCAP 106*   Lipid Profile: No results for input(s):  CHOL, HDL, LDLCALC, TRIG, CHOLHDL, LDLDIRECT in the last 72 hours. Thyroid Function Tests: No results for input(s): TSH, T4TOTAL, FREET4, T3FREE, THYROIDAB in the last 72 hours. Anemia Panel: No results for input(s): VITAMINB12, FOLATE, FERRITIN, TIBC, IRON, RETICCTPCT in the last 72 hours. Sepsis Labs: Recent Labs  Lab 07/30/20 1627  PROCALCITON <0.10  LATICACIDVEN 1.0    Recent Results (from the past 240 hour(s))  Resp Panel by RT-PCR (Flu A&B, Covid) Nasopharyngeal Swab     Status: None   Collection Time: 07/30/20  4:27 PM   Specimen: Nasopharyngeal Swab; Nasopharyngeal(NP) swabs in vial transport medium  Result Value Ref Range Status  SARS Coronavirus 2 by RT PCR NEGATIVE NEGATIVE Final    Comment: (NOTE) SARS-CoV-2 target nucleic acids are NOT DETECTED.  The SARS-CoV-2 RNA is generally detectable in upper respiratory specimens during the acute phase of infection. The lowest concentration of SARS-CoV-2 viral copies this assay can detect is 138 copies/mL. A negative result does not preclude SARS-Cov-2 infection and should not be used as the sole basis for treatment or other patient management decisions. A negative result may occur with  improper specimen collection/handling, submission of specimen other than nasopharyngeal swab, presence of viral mutation(s) within the areas targeted by this assay, and inadequate number of viral copies(<138 copies/mL). A negative result must be combined with clinical observations, patient history, and epidemiological information. The expected result is Negative.  Fact Sheet for Patients:  BloggerCourse.comhttps://www.fda.gov/media/152166/download  Fact Sheet for Healthcare Providers:  SeriousBroker.ithttps://www.fda.gov/media/152162/download  This test is no t yet approved or cleared by the [place] FDA and  has been authorized for detection and/or diagnosis of SARS-CoV-2 by FDA under an Emergency Use Authorization (EUA). This EUA will remain  in effect (meaning  this test can be used) for the duration of the COVID-19 declaration under Section 564(b)(1) of the Act, 21 U.S.C.section 360bbb-3(b)(1), unless the authorization is terminated  or revoked sooner.       Influenza A by PCR NEGATIVE NEGATIVE Final   Influenza B by PCR NEGATIVE NEGATIVE Final    Comment: (NOTE) The Xpert Xpress SARS-CoV-2/FLU/RSV plus assay is intended as an aid in the diagnosis of influenza from Nasopharyngeal swab specimens and should not be used as a sole basis for treatment. Nasal washings and aspirates are unacceptable for Xpert Xpress SARS-CoV-2/FLU/RSV testing.  Fact Sheet for Patients: BloggerCourse.comhttps://www.fda.gov/media/152166/download  Fact Sheet for Healthcare Providers: SeriousBroker.ithttps://www.fda.gov/media/152162/download  This test is not yet approved or cleared by the [place] FDA and has been authorized for detection and/or diagnosis of SARS-CoV-2 by FDA under an Emergency Use Authorization (EUA). This EUA will remain in effect (meaning this test can be used) for the duration of the COVID-19 declaration under Section 564(b)(1) of the Act, 21 U.S.C. section 360bbb-3(b)(1), unless the authorization is terminated or revoked.  Performed at [facility], [address] [place], [place] [address]   Blood Culture (routine x 2)     Status: None   Collection Time: 07/30/20  4:27 PM   Specimen: BLOOD  Result Value Ref Range Status   Specimen Description BLOOD LEFT ANTECUBITAL  Final   Special Requests   Final    BOTTLES DRAWN AEROBIC AND ANAEROBIC Blood Culture adequate volume   Culture   Final    NO GROWTH 5 DAYS Performed at [facility], [address] [place], [place] [address]    Report Status 08/04/2020 FINAL  Final  Blood Culture (routine x 2)     Status: None   Collection Time: 07/30/20  4:27 PM   Specimen: BLOOD LEFT HAND  Result Value Ref Range Status   Specimen Description BLOOD LEFT HAND  Final   Special Requests   Final    BOTTLES DRAWN  AEROBIC AND ANAEROBIC Blood Culture results may not be optimal due to an inadequate volume of blood received in culture bottles   Culture   Final    NO GROWTH 5 DAYS Performed at [facility], [address] [place], [place] [address]    Report Status 08/04/2020 FINAL  Final  MRSA PCR Screening     Status: None   Collection Time:  07/31/20 10:39 PM   Specimen: Nasal Mucosa; Nasopharyngeal  Result Value Ref Range Status   MRSA by PCR NEGATIVE NEGATIVE Final    Comment:        The GeneXpert MRSA Assay (FDA approved for NASAL specimens only), is one component of a comprehensive MRSA colonization surveillance program. It is not intended to diagnose MRSA infection nor to guide or monitor treatment for MRSA infections. Performed at [facility], [address], [place], [place] [address]          Radiology Studies: No results found.      Scheduled Meds: . cycloSPORINE  1 drop Both Eyes BID  . ezetimibe  10 mg Oral Daily  . feeding supplement  237 mL Oral Q24H  . furosemide  80 mg Intravenous Q12H  . magnesium oxide  400 mg Oral Daily  . metoprolol tartrate  12.5 mg Oral BID  . multivitamin with minerals  1 tablet Oral Daily  . mupirocin cream   Topical BID  . pantoprazole  40 mg Oral Daily  . phenytoin  200 mg Oral BID WC  . potassium chloride  40 mEq Oral Daily  . rosuvastatin  40 mg Oral QHS  . senna  1 tablet Oral BID  . sodium chloride flush  3 mL Intravenous Q12H  . Warfarin - Pharmacist Dosing Inpatient   Does not apply q1600   Continuous Infusions: . sodium chloride            Glade Lloyd, MD [facility] 08/06/2020, 7:14 AM

## 2020-08-06 NOTE — Progress Notes (Signed)
Cardiology follow up has been arranged. 

## 2020-08-06 NOTE — Progress Notes (Addendum)
Progress Note  Patient Name: James Villegas Date of Encounter: 08/06/2020  [facility] Cardiologist: Tobias Alexander, MD   Subjective   No dyspnea or CP  Inpatient Medications    Scheduled Meds: . cycloSPORINE  1 drop Both Eyes BID  . ezetimibe  10 mg Oral Daily  . feeding supplement  237 mL Oral Q24H  . furosemide  80 mg Intravenous Q12H  . magnesium oxide  400 mg Oral Daily  . metoprolol tartrate  12.5 mg Oral BID  . multivitamin with minerals  1 tablet Oral Daily  . mupirocin cream   Topical BID  . pantoprazole  40 mg Oral Daily  . phenytoin  200 mg Oral BID WC  . potassium chloride  40 mEq Oral Daily  . rosuvastatin  40 mg Oral QHS  . senna  1 tablet Oral BID  . sodium chloride flush  3 mL Intravenous Q12H  . Warfarin - Pharmacist Dosing Inpatient   Does not apply q1600   Continuous Infusions: . sodium chloride     PRN Meds: sodium chloride, acetaminophen **OR** acetaminophen, bisacodyl, polyethylene glycol, sodium chloride flush   Vital Signs    Vitals:   08/05/20 1725 08/05/20 1928 08/06/20 0017 08/06/20 0419  BP:  107/65 117/67 119/69  Pulse:  85 81 89  Resp: 16 20 20 17   Temp:  98.2 F (36.8 C) 98.6 F (37 C) 97.8 F (36.6 C)  TempSrc:  Oral Oral Oral  SpO2:  93% 92% 96%  Weight:    134.1 kg  Height:        Intake/Output Summary (Last 24 hours) at 08/06/2020 0704 Last data filed at 08/05/2020 2300 Gross per 24 hour  Intake 840 ml  Output 2000 ml  Net -1160 ml   Last 3 Weights 08/06/2020 08/05/2020 08/04/2020  Weight (lbs) 295 lb 10.2 oz 300 lb 11.3 oz 300 lb 11.3 oz  Weight (kg) 134.1 kg 136.4 kg 136.4 kg      Telemetry    Atrial fibrillation, rate controlled - Personally Reviewed  Physical Exam   GEN: WD obese Neck: JVP difficult to assess Cardiac: irregular, no gallop Respiratory: CTA; no wheeze GI: Soft, obese, no masses MS: No edema Neuro:  hemiparetic, no change Psych: Flat affect   Labs    High Sensitivity Troponin:    Recent Labs  Lab 07/30/20 1627 07/30/20 1821 07/31/20 0100  TROPONINIHS 76* 83* 94*      Chemistry Recent Labs  Lab 07/30/20 1627 07/30/20 1645 07/31/20 0500 08/01/20 0044 08/02/20 0129 08/04/20 0123 08/05/20 0103 08/06/20 0042  NA 131*   < > 132* 139   < > 140 137 139  K 4.5   < > 3.9 4.0   < > 4.1 4.1 4.1  CL 88*   < > 89* 92*   < > 90* 86* 88*  CO2 32  --  34* 35*   < > 43* 42* 45*  GLUCOSE 124*   < > 93 94   < > 99 110* 94  BUN 14   < > 13 10   < > 11 15 13   CREATININE 0.75   < > 0.70 0.57*   < > 0.58* 0.55* 0.53*  CALCIUM 8.4*  --  8.5* 8.5*   < > 8.8* 8.6* 9.0  PROT 6.2*  --  5.3* 5.4*  --   --   --   --   ALBUMIN 3.1*  --  2.6* 2.7*  --   --   --   --  AST 25  --  19 20  --   --   --   --   ALT 29  --  24 25  --   --   --   --   ALKPHOS 104  --  91 87  --   --   --   --   BILITOT 0.8  --  1.0 0.9  --   --   --   --   GFRNONAA >60  --  >60 >60   < > >60 >60 >60  ANIONGAP 11  --  9 12   < > 7 9 6    < > = values in this interval not displayed.     Hematology Recent Labs  Lab 07/31/20 0500 08/01/20 0044 08/02/20 0129  WBC 8.2 8.9 11.6*  RBC 4.65 4.66 4.92  HGB 14.1 14.5 14.8  HCT 45.0 44.1 47.0  MCV 96.8 94.6 95.5  MCH 30.3 31.1 30.1  MCHC 31.3 32.9 31.5  RDW 14.3 14.3 14.2  PLT 207 229 254    BNP Recent Labs  Lab 07/30/20 1627  BNP 960.3*     DDimer  Recent Labs  Lab 07/30/20 1627  DDIMER 0.57*     Patient Profile     Mr. Sigal is a 65 year old man with prior stroke with residual right hemiparesis, wheelchair-bound nursing home resident, rectus sheath hematoma, coagulopathy, persistent atrial fibrillation on warfarin, ascending aortic aneurysm, hypertension, morbid obesity with obesity hypoventilation syndrome admitted with hypoxic and hypercarbic respiratory failure in the setting of acute on chronic diastolic heart failure and obesity hypoventilation syndrome.   Assessment & Plan    1 acute on chronic diastolic congestive heart  failure-I/O -1160.  Wt 134.1 kg.  Symptoms have resolved and he states his breathing is back to normal.  Discontinue IV Lasix and treat with Demadex 20 mg twice daily.  He will take an additional 20 mg daily for weight gain of 2 to 3 pounds.  He will need BMET 1 week following discharge.    2 permanent atrial fibrillation-heart rate is controlled.  Continue metoprolol.  Continue Coumadin with goal INR 2-3.  Given history of CVA would add Lovenox until INR 2 or greater.  3 obstructive sleep apnea-we will need outpatient assistance with this issue.  4 ascending aortic aneurysm-patient has follow-up CTA April 2022.  However he would likely not be a good candidate for aortic root replacement.  5 morbid obesity  Patient can be discharged on present medications from a cardiac standpoint.  Check INR, potassium and renal function in 1 week.  Follow-up with APP 2 weeks.  Cardiology will sign off.  Please call with questions.  For questions or updates, please contact [facility] Please consult [email or web address] for contact info under        Signed, May 2022, MD  08/06/2020, 7:04 AM

## 2020-08-07 LAB — CBC
HCT: 41.8 % (ref 39.0–52.0)
Hemoglobin: 13.5 g/dL (ref 13.0–17.0)
MCH: 30.8 pg (ref 26.0–34.0)
MCHC: 32.3 g/dL (ref 30.0–36.0)
MCV: 95.2 fL (ref 80.0–100.0)
Platelets: 241 10*3/uL (ref 150–400)
RBC: 4.39 MIL/uL (ref 4.22–5.81)
RDW: 13.4 % (ref 11.5–15.5)
WBC: 7.6 10*3/uL (ref 4.0–10.5)
nRBC: 0 % (ref 0.0–0.2)

## 2020-08-07 LAB — BASIC METABOLIC PANEL
Anion gap: 10 (ref 5–15)
BUN: 15 mg/dL (ref 8–23)
CO2: 40 mmol/L — ABNORMAL HIGH (ref 22–32)
Calcium: 8.7 mg/dL — ABNORMAL LOW (ref 8.9–10.3)
Chloride: 89 mmol/L — ABNORMAL LOW (ref 98–111)
Creatinine, Ser: 0.51 mg/dL — ABNORMAL LOW (ref 0.61–1.24)
GFR, Estimated: >60 mL/min (ref >60)
Glucose, Bld: 99 mg/dL (ref 70–99)
Potassium: 4.3 mmol/L (ref 3.5–5.1)
Sodium: 139 mmol/L (ref 135–145)

## 2020-08-07 LAB — PROTIME-INR
INR: 2.3 — ABNORMAL HIGH (ref 0.8–1.2)
Prothrombin Time: 24.5 seconds — ABNORMAL HIGH (ref 11.4–15.2)

## 2020-08-07 LAB — MAGNESIUM: Magnesium: 2.3 mg/dL (ref 1.7–2.4)

## 2020-08-07 MED ORDER — POTASSIUM CHLORIDE CRYS ER 20 MEQ PO TBCR: 40.0000 meq | EXTENDED_RELEASE_TABLET | Freq: Every day | ORAL | 0 refills | Status: DC

## 2020-08-07 MED ORDER — WARFARIN SODIUM 5 MG PO TABS: 5.0000 mg | ORAL_TABLET | Freq: Every day | ORAL | 0 refills | Status: DC

## 2020-08-07 MED ORDER — TORSEMIDE 20 MG PO TABS: 20.0000 mg | ORAL_TABLET | Freq: Two times a day (BID) | ORAL | 0 refills | Status: DC

## 2020-08-07 NOTE — Discharge Summary (Signed)
Physician Discharge Summary  James Villegas ZOX:[id number] DOB: 05-Jan-1956 DOA: 07/30/2020  PCP: Karna Dupes, MD  Admit date: 07/30/2020 Discharge date: 08/07/2020  Admitted From: SNF Disposition: SNF  Recommendations for Outpatient Follow-up:  1. Follow up with SNF provider at earliest convenience with repeat CBC/BMP/INR.  Coumadin dose to be adjusted according to INR levels at SNF 2. Outpatient follow-up with cardiology 3. Outpatient follow-up with palliative care 4. Follow up in ED if symptoms worsen or new appear   Home Health: No Equipment/Devices: Oxygen via nasal cannula  Discharge Condition: Poor CODE STATUS: DNR Diet recommendation: Heart healthy/fluid restriction of up to 1200 cc a day  Brief/Interim Summary: 65 year old male with history of unspecified CVA with right-sided weakness, A. fib on Coumadin, hypertension, obesity, bradycardia, chronic hypercapnic respiratory failure, seizure disorder, chronic diastolic CHF, hyperlipidemia presented with altered mental status from his nursing facility.  Patient was poorly responsive on presentation and was hypoxic down to 50% on room air requiring nonrebreather.  ABG showed hypercapnia and patient was started on BiPAP with some improvement in mental status.  He was given intravenous Lasix.  During the hospitalization, he was treated with intravenous diuretics.  Cardiology was consulted.  His condition gradually improved.  He has had good diuresis.  Cardiology has signed off and started him on oral Demadex with outpatient follow-up with cardiology.Marland Kitchen  He will be discharged back to SNF today.  Discharge Diagnoses:   Acute on chronic respiratory failure with hypoxia and hypercapnia Acute on chronic diastolic CHF Acute metabolic encephalopathy Mild elevated troponins -Presented with altered mental status and hypoxia/hypercapnia requiring BiPAP and intravenous Lasix.  Chest x-ray on presentation showed pulmonary edema.  COVID-19 and  flu testing were negative -Mental status has much improved.   -Currently on 4-5L oxygen via nasal cannula -Troponins did not trend up.  Strict input and output.  Daily weights.  Fluid restriction.  Negative balance of 81191 cc since admission -Intravenous Lasix has already been switched to oral Demadex by cardiology on 08/06/2020.  Cardiology has signed off and recommend outpatient cardiology follow-up. Continue metoprolol.  Spironolactone has not been restarted by cardiology -Diet as per SLP recommendations -Patient has had difficulty obtaining outpatient sleep study. -Currently remains stable.  Discharge to SNF today. -Some of the sedatives will remain on hold including gabapentin/trazodone  Chronic A. Fib/protein S and protein C deficiency -Continue Coumadin.  Monitor INR.  Dose to be adjusted according to INR levels at SNF -Continue metoprolol as per cardiology -Currently rate controlled  Morbid obesity -Outpatient follow-up  Hyperlipidemia -Continue rosuvastatin  History of seizure disorder -Continue phenytoin.  No recent seizures reported  Generalized deconditioning -Overall prognosis is guarded to poor.  Palliative care evaluation appreciated. -CODE STATUS has been changed back to DNR after palliative care discussion -Recommend continued outpatient palliative care follow-up  Discharge Instructions  Discharge Instructions    Amb Referral to Palliative Care   Complete by: As directed    Goals of care   Ambulatory referral to Cardiology   Complete by: As directed    Hospital follow-up   Diet - low sodium heart healthy   Complete by: As directed    Fluid restriction of up to 1200 cc a day   Discharge wound care:   Complete by: As directed    Wound care  Every shift      Comments: Wash buttocks with no rinse spray, thoroughly dry. Apply antifungal powder (green and white bottle in clean utility) -- especially to the abrasions on  the right buttock.   Increase  activity slowly   Complete by: As directed      Allergies as of 08/07/2020      Reactions   Fenofibrate Other (See Comments)   Per MAR      Medication List    STOP taking these medications   albuterol (2.5 MG/3ML) 0.083% nebulizer solution Commonly known as: PROVENTIL   furosemide 10 MG/ML injection Commonly known as: LASIX   furosemide 40 MG tablet Commonly known as: LASIX   gabapentin 400 MG capsule Commonly known as: NEURONTIN   spironolactone 25 MG tablet Commonly known as: ALDACTONE   traZODone 50 MG tablet Commonly known as: DESYREL     TAKE these medications   acetaminophen 325 MG tablet Commonly known as: TYLENOL Take 650 mg by mouth every 6 (six) hours as needed for mild pain or headache. What changed: Another medication with the same name was removed. Continue taking this medication, and follow the directions you see here.   cycloSPORINE 0.05 % ophthalmic emulsion Commonly known as: RESTASIS Place 1 drop into both eyes 2 (two) times daily.   ezetimibe 10 MG tablet Commonly known as: ZETIA Take 10 mg by mouth daily.   fish oil-omega-3 fatty acids 1000 MG capsule Take 1 g by mouth 2 (two) times daily with a meal.   metoprolol tartrate 25 MG tablet Commonly known as: LOPRESSOR Take 0.5 tablets (12.5 mg total) by mouth 2 (two) times daily. What changed: when to take this   omeprazole 40 MG capsule Commonly known as: PRILOSEC Take 1 capsule (40 mg total) by mouth daily. What changed: when to take this   OXYGEN Inhale into the lungs See admin instructions. Nightly 9p-8a   phenytoin 100 MG ER capsule Commonly known as: DILANTIN Take 200 mg by mouth 2 (two) times daily with a meal.   potassium chloride SA 20 MEQ tablet Commonly known as: KLOR-CON Take 2 tablets (40 mEq total) by mouth daily. Start taking on: August 08, 2020   REFRESH OPTIVE ADVANCED OP Place 1 drop into both eyes 4 (four) times daily. Wait 3-5 minutes between eye meds    rosuvastatin 40 MG tablet Commonly known as: CRESTOR Take 40 mg by mouth at bedtime.   torsemide 20 MG tablet Commonly known as: DEMADEX Take 1 tablet (20 mg total) by mouth 2 (two) times daily.   venlafaxine 75 MG tablet Commonly known as: EFFEXOR Take 75 mg by mouth daily.   warfarin 5 MG tablet Commonly known as: Coumadin Take as directed. If you are unsure how to take this medication, talk to your nurse or doctor. Original instructions: Take 1 tablet (5 mg total) by mouth daily for 10 days. What changed:   medication strength  how much to take  when to take this            Discharge Care Instructions  (From admission, onward)         Start     Ordered   08/07/20 0000  Discharge wound care:       Comments: Wound care  Every shift      Comments: Wash buttocks with no rinse spray, thoroughly dry. Apply antifungal powder (green and white bottle in clean utility) -- especially to the abrasions on the right buttock.   08/07/20 0915          Allergies  Allergen Reactions  . Fenofibrate Other (See Comments)    Per [facility]    Consultations:  Cardiology/palliative care  Procedures/Studies: DG Chest Port 1 View  Result Date: 07/30/2020 CLINICAL DATA:  Shortness of breath EXAM: PORTABLE CHEST 1 VIEW COMPARISON:  02/20/2020 FINDINGS: Cardiomegaly. Unchanged prominence of the pulmonary vasculature and mild, diffuse interstitial opacity. The visualized skeletal structures are unremarkable. IMPRESSION: Cardiomegaly with unchanged prominence of the pulmonary vasculature and mild, diffuse interstitial opacity, most consistent with edema. No focal airspace opacity. Electronically Signed   By: Lauralyn Primes M.D.   On: 07/30/2020 16:35   ECHOCARDIOGRAM COMPLETE  Result Date: 07/31/2020    ECHOCARDIOGRAM REPORT   Patient Name:   James Villegas Date of Exam: 07/31/2020 Medical Rec #:  [id number]      Height:       73.0 in Accession #:    [id number]     Weight:       298.9 lb  Date of Birth:  Mar 17, 1956     BSA:          2.552 m Patient Age:    64 years       BP:           112/88 mmHg Patient Gender: M              HR:           81 bpm. Exam Location:  Inpatient Procedure: 2D Echo, Cardiac Doppler, Color Doppler and Intracardiac            Opacification Agent Indications:    I50.40* Unspecified combined systolic (congestive) and diastolic                 (congestive) heart failure  History:        Patient has prior history of Echocardiogram examinations, most                 recent 08/24/2019. CHF, Abnormal ECG, Stroke, Arrythmias:Atrial                 Fibrillation, Signs/Symptoms:Altered Mental Status; Risk                 Factors:Hypertension and Dyslipidemia. Seizure. PFO.  Sonographer:    Sheralyn Boatman RDCS Referring Phys: [id number] ANASTASSIA DOUTOVA  Sonographer Comments: Technically difficult study due to poor echo windows and patient is morbidly obese. Image acquisition challenging due to patient body habitus. IMPRESSIONS  1. Left ventricular ejection fraction, by estimation, is 65 to 70%. The left ventricle has normal function. The left ventricle has no regional wall motion abnormalities. There is severe eccentric left ventricular hypertrophy. Left ventricular diastolic function could not be evaluated.  2. Right ventricular systolic function is normal. The right ventricular size is normal. There is moderately elevated pulmonary artery systolic pressure.  3. The mitral valve is normal in structure. Trivial mitral valve regurgitation. No evidence of mitral stenosis.  4. The aortic valve is grossly normal. Aortic valve regurgitation is trivial. No aortic stenosis is present.  5. Aortic dilatation noted. There is moderate dilatation of the ascending aorta, measuring 47 mm.  6. The inferior vena cava is dilated in size with <50% respiratory variability, suggesting right atrial pressure of 15 mmHg. Comparison(s): No significant change from prior study. FINDINGS  Left Ventricle: Left  ventricular ejection fraction, by estimation, is 65 to 70%. The left ventricle has normal function. The left ventricle has no regional wall motion abnormalities. Definity contrast agent was given IV to delineate the left ventricular  endocardial borders. The left ventricular internal cavity size was normal in size. There is severe eccentric left ventricular  hypertrophy. Left ventricular diastolic function could not be evaluated due to atrial fibrillation. Left ventricular diastolic function could not be evaluated. Right Ventricle: The right ventricular size is normal. Right vetricular wall thickness was not well visualized. Right ventricular systolic function is normal. There is moderately elevated pulmonary artery systolic pressure. The tricuspid regurgitant velocity is 2.91 m/s, and with an assumed right atrial pressure of 15 mmHg, the estimated right ventricular systolic pressure is 48.9 mmHg. Left Atrium: Left atrial size was normal in size. Right Atrium: Right atrial size was normal in size. Pericardium: There is no evidence of pericardial effusion. Mitral Valve: The mitral valve is normal in structure. Trivial mitral valve regurgitation. No evidence of mitral valve stenosis. Tricuspid Valve: The tricuspid valve is normal in structure. Tricuspid valve regurgitation is mild . No evidence of tricuspid stenosis. Aortic Valve: The aortic valve is grossly normal. Aortic valve regurgitation is trivial. No aortic stenosis is present. Pulmonic Valve: The pulmonic valve was not well visualized. Pulmonic valve regurgitation is not visualized. Aorta: Aortic dilatation noted. There is moderate dilatation of the ascending aorta, measuring 47 mm. Venous: The inferior vena cava is dilated in size with less than 50% respiratory variability, suggesting right atrial pressure of 15 mmHg. IAS/Shunts: The atrial septum is grossly normal.  LEFT VENTRICLE PLAX 2D LVIDd:         4.10 cm LVIDs:         2.90 cm LV PW:         1.90 cm LV  IVS:        2.10 cm LVOT diam:     1.90 cm LV SV:         56 LV SV Index:   22 LVOT Area:     2.84 cm  LV Volumes (MOD) LV vol d, MOD A2C: 69.9 ml LV vol d, MOD A4C: 68.0 ml LV vol s, MOD A2C: 15.4 ml LV vol s, MOD A4C: 22.2 ml LV SV MOD A2C:     54.5 ml LV SV MOD A4C:     68.0 ml LV SV MOD BP:      48.6 ml RIGHT VENTRICLE            IVC RV S prime:     8.73 cm/s  IVC diam: 2.80 cm TAPSE (M-mode): 2.1 cm LEFT ATRIUM             Index       RIGHT ATRIUM           Index LA diam:        3.60 cm 1.41 cm/m  RA Area:     15.60 cm LA Vol (A2C):   40.4 ml 15.83 ml/m RA Volume:   44.70 ml  17.51 ml/m LA Vol (A4C):   45.2 ml 17.71 ml/m LA Biplane Vol: 47.1 ml 18.45 ml/m  AORTIC VALVE LVOT Vmax:   123.00 cm/s LVOT Vmean:  86.100 cm/s LVOT VTI:    0.196 m  AORTA Ao Root diam: 4.10 cm Ao Asc diam:  4.40 cm MITRAL VALVE               TRICUSPID VALVE MV Area (PHT): 4.12 cm    TR Peak grad:   33.9 mmHg MV Decel Time: 184 msec    TR Vmax:        291.00 cm/s MV E velocity: 73.40 cm/s  SHUNTS                            Systemic VTI:  0.20 m                            Systemic Diam: 1.90 cm Jodelle RedBridgette Christopher MD Electronically signed by Jodelle RedBridgette Christopher MD Signature Date/Time: 07/31/2020/3:59:17 PM    Final        Subjective: Patient seen and examined at bedside.  He is okay to go back to SNF today.  Denies worsening shortness of breath or chest pain.  No overnight fever or vomiting reported  Discharge Exam: Vitals:   08/07/20 0515 08/07/20 0830  BP:    Pulse:    Resp:    Temp:  (!) 97.5 F (36.4 C)  SpO2: 90%     General: Pt is alert, awake, not in acute distress.  Currently on 5 L oxygen by nasal cannula.  Chronically ill and deconditioned. Cardiovascular: rate controlled, S1/S2 + Respiratory: bilateral decreased breath sounds at bases Abdominal: Soft, obese, NT, ND, bowel sounds + Extremities: Bilateral lower extremity edema; no cyanosis    The results of  significant diagnostics from this hospitalization (including imaging, microbiology, ancillary and laboratory) are listed below for reference.     Microbiology: Recent Results (from the past 240 hour(s))  Resp Panel by RT-PCR (Flu A&B, Covid) Nasopharyngeal Swab     Status: None   Collection Time: 07/30/20  4:27 PM   Specimen: Nasopharyngeal Swab; Nasopharyngeal(NP) swabs in vial transport medium  Result Value Ref Range Status   SARS Coronavirus 2 by RT PCR NEGATIVE NEGATIVE Final    Comment: (NOTE) SARS-CoV-2 target nucleic acids are NOT DETECTED.  The SARS-CoV-2 RNA is generally detectable in upper respiratory specimens during the acute phase of infection. The lowest concentration of SARS-CoV-2 viral copies this assay can detect is 138 copies/mL. A negative result does not preclude SARS-Cov-2 infection and should not be used as the sole basis for treatment or other patient management decisions. A negative result may occur with  improper specimen collection/handling, submission of specimen other than nasopharyngeal swab, presence of viral mutation(s) within the areas targeted by this assay, and inadequate number of viral copies(<138 copies/mL). A negative result must be combined with clinical observations, patient history, and epidemiological information. The expected result is Negative.  Fact Sheet for Patients:  BloggerCourse.comhttps://www.fda.gov/media/152166/download  Fact Sheet for Healthcare Providers:  SeriousBroker.ithttps://www.fda.gov/media/152162/download  This test is no t yet approved or cleared by the [place] FDA and  has been authorized for detection and/or diagnosis of SARS-CoV-2 by FDA under an Emergency Use Authorization (EUA). This EUA will remain  in effect (meaning this test can be used) for the duration of the COVID-19 declaration under Section 564(b)(1) of the Act, 21 U.S.C.section 360bbb-3(b)(1), unless the authorization is terminated  or revoked sooner.       Influenza A by  PCR NEGATIVE NEGATIVE Final   Influenza B by PCR NEGATIVE NEGATIVE Final    Comment: (NOTE) The Xpert Xpress SARS-CoV-2/FLU/RSV plus assay is intended as an aid in the diagnosis of influenza from Nasopharyngeal swab specimens and should not be used as a sole basis for treatment. Nasal washings and aspirates are unacceptable for Xpert Xpress SARS-CoV-2/FLU/RSV testing.  Fact Sheet for Patients: BloggerCourse.comhttps://www.fda.gov/media/152166/download  Fact Sheet for Healthcare Providers: SeriousBroker.ithttps://www.fda.gov/media/152162/download  This test is not yet approved or cleared by the [place] FDA  and has been authorized for detection and/or diagnosis of SARS-CoV-2 by FDA under an Emergency Use Authorization (EUA). This EUA will remain in effect (meaning this test can be used) for the duration of the COVID-19 declaration under Section 564(b)(1) of the Act, 21 U.S.C. section 360bbb-3(b)(1), unless the authorization is terminated or revoked.  Performed at [facility], [address] [place], [place] [address]   Blood Culture (routine x 2)     Status: None   Collection Time: 07/30/20  4:27 PM   Specimen: BLOOD  Result Value Ref Range Status   Specimen Description BLOOD LEFT ANTECUBITAL  Final   Special Requests   Final    BOTTLES DRAWN AEROBIC AND ANAEROBIC Blood Culture adequate volume   Culture   Final    NO GROWTH 5 DAYS Performed at [facility], [address], [place], [place] [address]    Report Status 08/04/2020 FINAL  Final  Blood Culture (routine x 2)     Status: None   Collection Time: 07/30/20  4:27 PM   Specimen: BLOOD LEFT HAND  Result Value Ref Range Status   Specimen Description BLOOD LEFT HAND  Final   Special Requests   Final    BOTTLES DRAWN AEROBIC AND ANAEROBIC Blood Culture results may not be optimal due to an inadequate volume of blood received in culture bottles   Culture   Final    NO GROWTH 5 DAYS Performed at [facility], [address]  [place], [place] [address]    Report Status 08/04/2020 FINAL  Final  MRSA PCR Screening     Status: None   Collection Time: 07/31/20 10:39 PM   Specimen: Nasal Mucosa; Nasopharyngeal  Result Value Ref Range Status   MRSA by PCR NEGATIVE NEGATIVE Final    Comment:        The GeneXpert MRSA Assay (FDA approved for NASAL specimens only), is one component of a comprehensive MRSA colonization surveillance program. It is not intended to diagnose MRSA infection nor to guide or monitor treatment for MRSA infections. Performed at [facility], [address], [place], [place] [address]   SARS CORONAVIRUS 2 (TAT 6-24 HRS) Nasopharyngeal     Status: None   Collection Time: 08/06/20  9:25 AM   Specimen: Nasopharyngeal  Result Value Ref Range Status   SARS Coronavirus 2 NEGATIVE NEGATIVE Final    Comment: (NOTE) SARS-CoV-2 target nucleic acids are NOT DETECTED.  The SARS-CoV-2 RNA is generally detectable in upper and lower respiratory specimens during the acute phase of infection. Negative results do not preclude SARS-CoV-2 infection, do not rule out co-infections with other pathogens, and should not be used as the sole basis for treatment or other patient management decisions. Negative results must be combined with clinical observations, patient history, and epidemiological information. The expected result is Negative.  Fact Sheet for Patients: HairSlick.no  Fact Sheet for Healthcare Providers: quierodirigir.com  This test is not yet approved or cleared by the [place] FDA and  has been authorized for detection and/or diagnosis of SARS-CoV-2 by FDA under an Emergency Use Authorization (EUA). This EUA will remain  in effect (meaning this test can be used) for the duration of the COVID-19 declaration under Se ction 564(b)(1) of the Act, 21 U.S.C. section 360bbb-3(b)(1), unless the authorization is terminated or revoked  sooner.  Performed at [facility], [address], [place], [place] [address]      Labs: BNP (last 3  results) Recent Labs    02/20/20 1002 07/30/20 1627  BNP 670.0* 960.3*   Basic Metabolic Panel: Recent Labs  Lab 08/03/20 0021 08/04/20 0123 08/05/20 0103 08/06/20 0042 08/07/20 0101  NA 140 140 137 139 139  K 3.9 4.1 4.1 4.1 4.3  CL 91* 90* 86* 88* 89*  CO2 40* 43* 42* 45* 40*  GLUCOSE 100* 99 110* 94 99  BUN 12 11 15 13 15   CREATININE 0.54* 0.58* 0.55* 0.53* 0.51*  CALCIUM 8.6* 8.8* 8.6* 9.0 8.7*  MG 1.9 2.1 2.3 2.3 2.3   Liver Function Tests: Recent Labs  Lab 08/01/20 0044  AST 20  ALT 25  ALKPHOS 87  BILITOT 0.9  PROT 5.4*  ALBUMIN 2.7*   No results for input(s): LIPASE, AMYLASE in the last 168 hours. No results for input(s): AMMONIA in the last 168 hours. CBC: Recent Labs  Lab 08/01/20 0044 08/02/20 0129 08/07/20 0101  WBC 8.9 11.6* 7.6  NEUTROABS 6.1 8.7*  --   HGB 14.5 14.8 13.5  HCT 44.1 47.0 41.8  MCV 94.6 95.5 95.2  PLT 229 254 241   Cardiac Enzymes: No results for input(s): CKTOTAL, CKMB, CKMBINDEX, TROPONINI in the last 168 hours. BNP: Invalid input(s): POCBNP CBG: Recent Labs  Lab 07/31/20 2203  GLUCAP 106*   D-Dimer No results for input(s): DDIMER in the last 72 hours. Hgb A1c No results for input(s): HGBA1C in the last 72 hours. Lipid Profile No results for input(s): CHOL, HDL, LDLCALC, TRIG, CHOLHDL, LDLDIRECT in the last 72 hours. Thyroid function studies No results for input(s): TSH, T4TOTAL, T3FREE, THYROIDAB in the last 72 hours.  Invalid input(s): FREET3 Anemia work up No results for input(s): VITAMINB12, FOLATE, FERRITIN, TIBC, IRON, RETICCTPCT in the last 72 hours. Urinalysis    Component Value Date/Time   COLORURINE AMBER (A) 07/30/2020 1821   APPEARANCEUR HAZY (A) 07/30/2020 1821   LABSPEC 1.021 07/30/2020 1821   PHURINE 5.0 07/30/2020 1821   GLUCOSEU NEGATIVE 07/30/2020 1821   HGBUR NEGATIVE  07/30/2020 1821   BILIRUBINUR NEGATIVE 07/30/2020 1821   KETONESUR NEGATIVE 07/30/2020 1821   PROTEINUR 30 (A) 07/30/2020 1821   UROBILINOGEN 1.0 12/28/2010 1809   NITRITE NEGATIVE 07/30/2020 1821   LEUKOCYTESUR NEGATIVE 07/30/2020 1821   Sepsis Labs Invalid input(s): PROCALCITONIN,  WBC,  LACTICIDVEN Microbiology Recent Results (from the past 240 hour(s))  Resp Panel by RT-PCR (Flu A&B, Covid) Nasopharyngeal Swab     Status: None   Collection Time: 07/30/20  4:27 PM   Specimen: Nasopharyngeal Swab; Nasopharyngeal(NP) swabs in vial transport medium  Result Value Ref Range Status   SARS Coronavirus 2 by RT PCR NEGATIVE NEGATIVE Final    Comment: (NOTE) SARS-CoV-2 target nucleic acids are NOT DETECTED.  The SARS-CoV-2 RNA is generally detectable in upper respiratory specimens during the acute phase of infection. The lowest concentration of SARS-CoV-2 viral copies this assay can detect is 138 copies/mL. A negative result does not preclude SARS-Cov-2 infection and should not be used as the sole basis for treatment or other patient management decisions. A negative result may occur with  improper specimen collection/handling, submission of specimen other than nasopharyngeal swab, presence of viral mutation(s) within the areas targeted by this assay, and inadequate number of viral copies(<138 copies/mL). A negative result must be combined with clinical observations, patient history, and epidemiological information. The expected result is Negative.  Fact Sheet for Patients:  08/01/20  Fact Sheet for Healthcare Providers:  BloggerCourse.com  This test is no t yet approved  or cleared by the [place] and  has been authorized for detection and/or diagnosis of SARS-CoV-2 by FDA under an Emergency Use Authorization (EUA). This EUA will remain  in effect (meaning this test can be used) for the duration of the COVID-19  declaration under Section 564(b)(1) of the Act, 21 U.S.C.section 360bbb-3(b)(1), unless the authorization is terminated  or revoked sooner.       Influenza A by PCR NEGATIVE NEGATIVE Final   Influenza B by PCR NEGATIVE NEGATIVE Final    Comment: (NOTE) The Xpert Xpress SARS-CoV-2/FLU/RSV plus assay is intended as an aid in the diagnosis of influenza from Nasopharyngeal swab specimens and should not be used as a sole basis for treatment. Nasal washings and aspirates are unacceptable for Xpert Xpress SARS-CoV-2/FLU/RSV testing.  Fact Sheet for Patients: BloggerCourse.com  Fact Sheet for Healthcare Providers: SeriousBroker.it  This test is not yet approved or cleared by the [place] FDA and has been authorized for detection and/or diagnosis of SARS-CoV-2 by FDA under an Emergency Use Authorization (EUA). This EUA will remain in effect (meaning this test can be used) for the duration of the COVID-19 declaration under Section 564(b)(1) of the Act, 21 U.S.C. section 360bbb-3(b)(1), unless the authorization is terminated or revoked.  Performed at [facility], [address], [place], [place] [address]   Blood Culture (routine x 2)     Status: None   Collection Time: 07/30/20  4:27 PM   Specimen: BLOOD  Result Value Ref Range Status   Specimen Description BLOOD LEFT ANTECUBITAL  Final   Special Requests   Final    BOTTLES DRAWN AEROBIC AND ANAEROBIC Blood Culture adequate volume   Culture   Final    NO GROWTH 5 DAYS Performed at [facility], [address], [place], [place] [address]    Report Status 08/04/2020 FINAL  Final  Blood Culture (routine x 2)     Status: None   Collection Time: 07/30/20  4:27 PM   Specimen: BLOOD LEFT HAND  Result Value Ref Range Status   Specimen Description BLOOD LEFT HAND  Final   Special Requests   Final    BOTTLES DRAWN AEROBIC AND ANAEROBIC Blood Culture results may not be  optimal due to an inadequate volume of blood received in culture bottles   Culture   Final    NO GROWTH 5 DAYS Performed at [facility], [address] [place], [place] [address]    Report Status 08/04/2020 FINAL  Final  MRSA PCR Screening     Status: None   Collection Time: 07/31/20 10:39 PM   Specimen: Nasal Mucosa; Nasopharyngeal  Result Value Ref Range Status   MRSA by PCR NEGATIVE NEGATIVE Final    Comment:        The GeneXpert MRSA Assay (FDA approved for NASAL specimens only), is one component of a comprehensive MRSA colonization surveillance program. It is not intended to diagnose MRSA infection nor to guide or monitor treatment for MRSA infections. Performed at [facility], [address], [place], [place] [address]   SARS CORONAVIRUS 2 (TAT 6-24 HRS) Nasopharyngeal     Status: None   Collection Time: 08/06/20  9:25 AM   Specimen: Nasopharyngeal  Result Value Ref Range Status   SARS Coronavirus 2 NEGATIVE NEGATIVE Final    Comment: (NOTE) SARS-CoV-2 target nucleic acids are NOT DETECTED.  The SARS-CoV-2 RNA is generally detectable in upper and lower respiratory specimens during the acute  phase of infection. Negative results do not preclude SARS-CoV-2 infection, do not rule out co-infections with other pathogens, and should not be used as the sole basis for treatment or other patient management decisions. Negative results must be combined with clinical observations, patient history, and epidemiological information. The expected result is Negative.  Fact Sheet for Patients: HairSlick.no  Fact Sheet for Healthcare Providers: quierodirigir.com  This test is not yet approved or cleared by the [place] FDA and  has been authorized for detection and/or diagnosis of SARS-CoV-2 by FDA under an Emergency Use Authorization (EUA). This EUA will remain  in effect (meaning this test can be used) for  the duration of the COVID-19 declaration under Se ction 564(b)(1) of the Act, 21 U.S.C. section 360bbb-3(b)(1), unless the authorization is terminated or revoked sooner.  Performed at [facility], [address], [place], [place] [address]      Time coordinating discharge: 35 minutes  SIGNED:   Glade Lloyd, MD  [facility] 08/07/2020, 9:15 AM

## 2020-08-07 NOTE — Progress Notes (Signed)
Daily Progress Note   Patient Name: James Villegas       Date: 08/07/2020 DOB: 30-Jun-1955  Age: 65 y.o. MRN#: [id number] Attending Physician: No att. providers found Primary Care Physician: Raymondo Band, MD Admit Date: 07/30/2020  Reason for Follow-up: continued GOC discussion  Subjective: Patient with no acute complaints. He is glad to be discharging back to SNF today. I inquired if he had been able to read any of the "Hard Choices" book I had provided a few days ago; he states that he had. Discussed wanting to provide him with adequate time to consider his wishes and not feel rushed on any decisions.   We reviewed each section of the MOST form in detail. Provided him an opportunity to ask any questions - he does not have any at this time.   MOST form was completed at bedside. The patient outlined his wishes for the following treatment decisions:  Cardiopulmonary Resuscitation: Do Not Attempt Resuscitation (DNR/No CPR)  Medical Interventions: Comfort Measures: Keep clean, warm, and dry. Use medication by any route, positioning, wound care, and other measures to relieve pain and suffering. Use oxygen, suction and manual treatment of airway obstruction as needed for comfort. Do not transfer to the hospital unless comfort needs cannot be met in current location.  Antibiotics: Antibiotics if indicated  IV Fluids: No IV fluids (provide other measures to ensure comfort)  Feeding Tube: No feeding tube     Length of Stay: 8  Current Medications: Scheduled Meds:  . cycloSPORINE  1 drop Both Eyes BID  . enoxaparin (LOVENOX) injection  1 mg/kg Subcutaneous Q12H  . ezetimibe  10 mg Oral Daily  . feeding supplement  237 mL Oral Q24H  . magnesium oxide  400 mg Oral Daily  . metoprolol  tartrate  12.5 mg Oral BID  . multivitamin with minerals  1 tablet Oral Daily  . mupirocin cream   Topical BID  . pantoprazole  40 mg Oral Daily  . phenytoin  200 mg Oral BID WC  . potassium chloride  40 mEq Oral Daily  . rosuvastatin  40 mg Oral QHS  . senna  1 tablet Oral BID  . sodium chloride flush  3 mL Intravenous Q12H  . torsemide  20 mg Oral BID  . Warfarin - Pharmacist Dosing Inpatient  Does not apply q1600    Continuous Infusions: . sodium chloride      PRN Meds: sodium chloride, acetaminophen **OR** acetaminophen, bisacodyl, diphenhydrAMINE, polyethylene glycol, sodium chloride flush  Physical Exam Vitals reviewed.  Constitutional:      General: He is not in acute distress.    Appearance: He is obese.  Cardiovascular:     Rate and Rhythm: Normal rate. Rhythm irregularly irregular.     Pulses: Normal pulses.     Comments: A-fib Pulmonary:     Effort: Pulmonary effort is normal.  Neurological:     Mental Status: He is alert and oriented to person, place, and time.     Comments: Right-sided hemiplegia  Psychiatric:        Mood and Affect: Affect is flat.             Vital Signs: BP 116/72 (BP Location: Left Arm)   Pulse 81   Temp (!) 97.5 F (36.4 C) (Oral)   Resp 18   Ht $R'6\' 1"'jZ$  (1.854 m)   Wt 133.2 kg   SpO2 92%   BMI 38.74 kg/m  SpO2: SpO2: 92 % O2 Device: O2 Device: Nasal Cannula  Intake/output summary:   Intake/Output Summary (Last 24 hours) at 08/07/2020 1417 Last data filed at 08/07/2020 1207 Gross per 24 hour  Intake 360 ml  Output 250 ml  Net 110 ml   LBM: Last BM Date: 08/06/20 Baseline Weight: Weight: 135.6 kg Most recent weight: Weight: 133.2 kg       Palliative Assessment/Data: PPS 30%     Palliative Care Assessment & Plan    HPI/Patient Profile:64 y.o.malewith past medical history of CVA with right sided weakness, atrial fibrillation on Coumadin, HTN, and chronic diastolic CHF who presented to the emergency department  from [facility] 2/21/2022with altered mental status. ED Course:He was poorly responsive on arrival and hypoxic down to 50% requiring NRB mask. ABG showed hypercapnia and patient was started on BiPAP with improved mental status. Chest x-ray showed cardiomegaly and mild diffuse interstitial opacity most consistent with edema. He was admitted to [facility] for acute chronic respiratory failure with hypoxia and hypercapnia and acute on chronic CHF.   Assessment: - acute on chronic respiratory failure with hypoxia and hypercapnia (improving) - acute on chronic diastolic CHF - acute metabolic encephalopathy - chronic A-fib - remote history of CVA with residual right hemiplegia - generalized deconditioning   Recommendations/Plan:  DNR/DNI as previously documented  MOST form completed, copy made to be scanned into EMR, original placed with packet to accompany patient at discharge  Patient discharging to SNF today  Goals of Care and Additional Recommendations:  Limitations on Scope of Treatment: No Artificial Feeding and No IV Fluids  Code Status:  DNR/DNI  Prognosis:   Unable to determine, overall poor  Discharge Planning:  SNF  Care plan was discussed with nursing  Thank you for allowing the Palliative Medicine Team to assist in the care of this patient.   Total Time 25 minutes Prolonged Time Billed  no       Greater than 50%  of this time was spent counseling and coordinating care related to the above assessment and plan.  Lavena Bullion, NP  Please contact Palliative Medicine Team phone at [phone] for questions and concerns.

## 2020-08-07 NOTE — TOC Transition Note (Addendum)
Transition of Care [facility]) - CM/SW Discharge Note   Patient Details  Name: James Villegas MRN: [id number] Date of Birth: [birth year]  Transition [facility]) CM/SW Contact:  Erin Sons, LCSW Phone Number: 08/07/2020, 11:05 AM   Clinical Narrative:     Patient will DC to: [facility] Anticipated DC date: 08/07/20 Transport by: Sharin Mons    Per MD patient ready for DC to [facility]. RN, patient, and facility notified of DC. Discharge Summary and FL2 sent to facility. RN to call report prior to discharge [phone]). DC packet on chart. Ambulance transport requested for patient.   CSW will sign off for now as social work intervention is no longer needed. Please consult [place] again if new needs arise.   Final next level of care: Skilled Nursing Facility Barriers to Discharge: No Barriers Identified   Patient Goals and CMS Choice        Discharge Placement              Patient chooses bed at: [facility] Patient to be transferred to facility by: [facility]   Patient and family notified of of transfer: 08/07/20  Discharge Plan and Services                                     Social Determinants of Health (SDOH) Interventions     Readmission Risk Interventions No flowsheet data found.

## 2020-08-08 DIAGNOSIS — M6281 Muscle weakness (generalized): Secondary | ICD-10-CM | POA: Diagnosis not present

## 2020-08-08 DIAGNOSIS — R293 Abnormal posture: Secondary | ICD-10-CM | POA: Diagnosis not present

## 2020-08-08 DIAGNOSIS — Z79899 Other long term (current) drug therapy: Secondary | ICD-10-CM | POA: Diagnosis not present

## 2020-08-08 DIAGNOSIS — E662 Morbid (severe) obesity with alveolar hypoventilation: Secondary | ICD-10-CM | POA: Diagnosis not present

## 2020-08-08 DIAGNOSIS — J9602 Acute respiratory failure with hypercapnia: Secondary | ICD-10-CM | POA: Diagnosis not present

## 2020-08-08 DIAGNOSIS — I5033 Acute on chronic diastolic (congestive) heart failure: Secondary | ICD-10-CM | POA: Diagnosis not present

## 2020-08-08 DIAGNOSIS — J9610 Chronic respiratory failure, unspecified whether with hypoxia or hypercapnia: Secondary | ICD-10-CM | POA: Diagnosis not present

## 2020-08-08 DIAGNOSIS — J9621 Acute and chronic respiratory failure with hypoxia: Secondary | ICD-10-CM | POA: Diagnosis not present

## 2020-08-08 DIAGNOSIS — I5020 Unspecified systolic (congestive) heart failure: Secondary | ICD-10-CM | POA: Diagnosis not present

## 2020-08-08 DIAGNOSIS — G9341 Metabolic encephalopathy: Secondary | ICD-10-CM | POA: Diagnosis not present

## 2020-08-09 DIAGNOSIS — G9341 Metabolic encephalopathy: Secondary | ICD-10-CM | POA: Diagnosis not present

## 2020-08-09 DIAGNOSIS — M6281 Muscle weakness (generalized): Secondary | ICD-10-CM | POA: Diagnosis not present

## 2020-08-09 DIAGNOSIS — R293 Abnormal posture: Secondary | ICD-10-CM | POA: Diagnosis not present

## 2020-08-09 DIAGNOSIS — I5033 Acute on chronic diastolic (congestive) heart failure: Secondary | ICD-10-CM | POA: Diagnosis not present

## 2020-08-09 DIAGNOSIS — J9621 Acute and chronic respiratory failure with hypoxia: Secondary | ICD-10-CM | POA: Diagnosis not present

## 2020-08-10 DIAGNOSIS — J9602 Acute respiratory failure with hypercapnia: Secondary | ICD-10-CM | POA: Diagnosis not present

## 2020-08-10 DIAGNOSIS — M6281 Muscle weakness (generalized): Secondary | ICD-10-CM | POA: Diagnosis not present

## 2020-08-10 DIAGNOSIS — G9341 Metabolic encephalopathy: Secondary | ICD-10-CM | POA: Diagnosis not present

## 2020-08-10 DIAGNOSIS — R600 Localized edema: Secondary | ICD-10-CM | POA: Diagnosis not present

## 2020-08-10 DIAGNOSIS — R293 Abnormal posture: Secondary | ICD-10-CM | POA: Diagnosis not present

## 2020-08-10 DIAGNOSIS — J9621 Acute and chronic respiratory failure with hypoxia: Secondary | ICD-10-CM | POA: Diagnosis not present

## 2020-08-10 DIAGNOSIS — I5032 Chronic diastolic (congestive) heart failure: Secondary | ICD-10-CM | POA: Diagnosis not present

## 2020-08-10 DIAGNOSIS — I5033 Acute on chronic diastolic (congestive) heart failure: Secondary | ICD-10-CM | POA: Diagnosis not present

## 2020-08-10 DIAGNOSIS — R5381 Other malaise: Secondary | ICD-10-CM | POA: Diagnosis not present

## 2020-08-13 DIAGNOSIS — I5033 Acute on chronic diastolic (congestive) heart failure: Secondary | ICD-10-CM | POA: Diagnosis not present

## 2020-08-13 DIAGNOSIS — M6281 Muscle weakness (generalized): Secondary | ICD-10-CM | POA: Diagnosis not present

## 2020-08-13 DIAGNOSIS — R293 Abnormal posture: Secondary | ICD-10-CM | POA: Diagnosis not present

## 2020-08-13 DIAGNOSIS — J9621 Acute and chronic respiratory failure with hypoxia: Secondary | ICD-10-CM | POA: Diagnosis not present

## 2020-08-13 DIAGNOSIS — G9341 Metabolic encephalopathy: Secondary | ICD-10-CM | POA: Diagnosis not present

## 2020-08-14 ENCOUNTER — Non-Acute Institutional Stay: Payer: Self-pay | Admitting: Hospice

## 2020-08-14 ENCOUNTER — Other Ambulatory Visit: Payer: Self-pay

## 2020-08-14 DIAGNOSIS — M6281 Muscle weakness (generalized): Secondary | ICD-10-CM | POA: Diagnosis not present

## 2020-08-14 DIAGNOSIS — I5033 Acute on chronic diastolic (congestive) heart failure: Secondary | ICD-10-CM | POA: Diagnosis not present

## 2020-08-14 DIAGNOSIS — G9341 Metabolic encephalopathy: Secondary | ICD-10-CM | POA: Diagnosis not present

## 2020-08-14 DIAGNOSIS — Z515 Encounter for palliative care: Secondary | ICD-10-CM

## 2020-08-14 DIAGNOSIS — J9621 Acute and chronic respiratory failure with hypoxia: Secondary | ICD-10-CM | POA: Diagnosis not present

## 2020-08-14 DIAGNOSIS — R293 Abnormal posture: Secondary | ICD-10-CM | POA: Diagnosis not present

## 2020-08-14 DIAGNOSIS — I5032 Chronic diastolic (congestive) heart failure: Secondary | ICD-10-CM | POA: Diagnosis not present

## 2020-08-14 DIAGNOSIS — R0600 Dyspnea, unspecified: Secondary | ICD-10-CM

## 2020-08-14 DIAGNOSIS — R0609 Other forms of dyspnea: Secondary | ICD-10-CM

## 2020-08-14 NOTE — Progress Notes (Signed)
PATIENT NAME: James Villegas [address] [place] [address] [phone] (home)  DOB: Dec 15, 1955 MRN: [id number]  PRIMARY CARE PROVIDER:    Karna Dupes, MD,  [address] [place] [address] [phone]  REFERRING PROVIDER:   Karna Dupes, MD [address] [place],  [place] [address] [phone]  RESPONSIBLE PARTY: Self/Sister - [name] [phone]  CHIEF COMPLAIN: Initial palliative care visit/shortness of breath  Visit is to build trust and highlight Palliative Medicine as specialized medical care for people living with serious illness, aimed at facilitating better quality of life through symptoms relief, assisting with advance care plan and establishing goals of care.  Discussion on the difference between Palliative and Hospice care.  Visit consisted of counseling and education dealing with the complex and emotionally intense issues of symptom management and palliative care in the setting of serious and potentially life-threatening illness. Palliative care team will continue to support patient, patient's family, and medical team.  RECOMMENDATIONS/PLAN:   Advance Care Planning: Our advance care planning conversation included a discussion about  the value and importance of advance care planning, exploration of goals of care in the event of a sudden injury or illness, identification and preparation of a healthcare agent and review and updating or creation of an advance directive document. Provided general support, encouragement and ample emotional support.  CODE STATUS: Patient is a Do Not Resuscitate  GOALS OF CARE: Goals of care include to maximize quality of life and symptom management.  MOST selections include comfort measures, antibiotics if indicated, no IV fluids, no feeding tube.  Both signed DNR and MOST form uploaded to [facility] health electronic medical record epic.  I spent 46  minutes providing this initial consultation.  More than 50% of the time in this consultation was spent on counseling patient and coordinating communication. -------------------------------------------------------------------------------------------------------------------------------------- Symptom Management/Plan:  Shortness of breath: Encourage cool environment, air circulation with fan if applicable, continue oxygen supplementation prn as ordered.  Encourage slow deep breathing.  Continue Torsemide, low sodium heart healthy diet/fluid restriction of up to 1200cc a day. Routine CBC BMP to monitor electrolytes. Palliative will continue to monitor for symptom management/decline and make recommendations as needed. Return 6 weeks or prn. Encouraged to call provider sooner with any concerns.   PPS: 40%, gets around in a motor wheel chair  Family /Caregiver/Community Supports: Patient in SNF for ongoing care  HISTORY OF PRESENT ILLNESS:  James Villegas is a 65 y.o. male with multiple medical problems including shortness of breath related to acute on diastolic congestive heart failure exacerbation/chronic respiratory failure, recent hospitalization 2/21 - 08/07/2020 for altered mental status/SOB/hypoxia/hypercapnia; was treated with intravenous diuretics and BIPAP; discharged to SNF on improvement for ongoing care. Shortness of breath is chronic, intermittent with flare ups, alleviated with oxygen supplementation and diuretics and worsened with fluid overload.  History of Afib on coumadin, R hemiplegia hemiparesis following CVA, chronic diastolic heart failure. History obtained from review of EMR, discussion with patient/family. Review and summarization of Epic records shows history from other than patient. Rest of 10 point ROS asked and negative.  Palliative Care was asked to follow this patient by consultation request of Karna Dupes, MD to help address complex decision making in the context of goals of care.   HOSPICE ELIGIBILITY/DIAGNOSIS:  TBD  PAST MEDICAL HISTORY:  Past Medical History:  Diagnosis Date  . Acute on chronic congestive heart failure (HCC)   . Anemia, secondary  SECONDARY TO ACUTE BLOOD LOSS  . Anxiety disorder   . Bloody stool 08/29/2011   intermittent along with constipation.   . 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 Depression   . 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 Dysphagia   . Hyperlipidemia   . Hypertension   . Lupus anticoagulant disorder (HCC) 1990  . Morbid obesity (HCC)   . PFO (patent foramen ovale)    TEE 2/10: EF 60%, trivial AI, mild Ao root dilatation, mod PFO with R-L shunting, atrial septal aneurysm;   echo 7/12: EF 65-70%, grade 1 diast dysfxn, mild MR, LVOT showed severe obstruction  . Protein C deficiency (HCC)   . Protein S deficiency (HCC)   . Rectus sheath hematoma 7/12   required reversal of anticoagulation and c/b DVT req. IVC filter  . Seizure disorder [facility])      SOCIAL HX:  Social History   Tobacco Use  . Smoking status: Former Smoker    Packs/day: 1.00    Years: 15.00    Pack years: 15.00    Types: Cigarettes, Cigars    Quit date: 2010    Years since quitting: 12.1  . Smokeless tobacco: Never Used  Substance Use Topics  . 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 Sleep apnea Sister     Review of lab tests/diagnostics   No results for input(s): WBC, HGB, HCT, PLT, MCV in the last 168 hours. No results for input(s): NA, K, CL, CO2, BUN, CREATININE, GLUCOSE in the last 168 hours. Latest GFR by Cockcroft Gault (not valid in AKI or ESRD) Estimated Creatinine Clearance: 137 mL/min (A) (by C-G formula based on SCr of 0.51 mg/dL (L)). No results for input(s): AST, ALT, ALKPHOS, GGT in the last 168 hours.  Invalid input(s): TBILI, CONJBILI, ALB, TOTALPROTEIN No components found  for: ALB No results for input(s): APTT, INR in the last 168 hours.  Invalid input(s): PTPATIENT No results for input(s): BNP, PROBNP in the last 168 hours.  ALLERGIES:  Allergies  Allergen Reactions  . Fenofibrate Other (See Comments)    Per MAR      PERTINENT MEDICATIONS:  Outpatient Encounter Medications as of 08/14/2020  Medication Sig  . acetaminophen (TYLENOL) 325 MG tablet Take 650 mg by mouth every 6 (six) hours as needed for mild pain or headache.   . Carboxymeth-Glycerin-Polysorb (REFRESH OPTIVE ADVANCED OP) Place 1 drop into both eyes 4 (four) times daily. Wait 3-5 minutes between eye meds  . cycloSPORINE (RESTASIS) 0.05 % ophthalmic emulsion Place 1 drop into both eyes 2 (two) times daily.  Marland Kitchen ezetimibe (ZETIA) 10 MG tablet Take 10 mg by mouth daily.  . fish oil-omega-3 fatty acids 1000 MG capsule Take 1 g by mouth 2 (two) times daily with a meal.  . metoprolol tartrate (LOPRESSOR) 25 MG tablet Take 0.5 tablets (12.5 mg total) by mouth 2 (two) times daily. (Patient taking differently: Take 12.5 mg by mouth 2 (two) times daily with a meal.)  . omeprazole (PRILOSEC) 40 MG capsule Take 1 capsule (40 mg total) by mouth daily. (Patient taking differently: Take 40 mg by mouth daily at 6 (six) AM.)  . OXYGEN Inhale into the lungs See admin instructions. Nightly 9p-8a  . phenytoin (DILANTIN) 100 MG ER  capsule Take 200 mg by mouth 2 (two) times daily with a meal.  . potassium chloride SA (KLOR-CON) 20 MEQ tablet Take 2 tablets (40 mEq total) by mouth daily.  . rosuvastatin (CRESTOR) 40 MG tablet Take 40 mg by mouth at bedtime.  . torsemide (DEMADEX) 20 MG tablet Take 1 tablet (20 mg total) by mouth 2 (two) times daily.  Marland Kitchen venlafaxine (EFFEXOR) 75 MG tablet Take 75 mg by mouth daily.  Marland Kitchen warfarin (COUMADIN) 5 MG tablet Take 1 tablet (5 mg total) by mouth daily for 10 days.   No facility-administered encounter medications on file as of 08/14/2020.    ROS  General: NAD Constitution:  Denies fever/chills EYES: denies vision changes ENMT: denies Xerostomia, dysphagia Cardiovascular: denies chest pain Pulmonary: denies  cough, denies dyspnea Abdomen: endorses fair appetite, denies constipation or diarrhea GU: denies dysuria MSK:  endorses ROM limitations, no falls reported Skin: denies rashes. Nursing reports open area to RUQ of abdomen and redness to sacrum; followed by facility wound nurse Neurological: endorses weakness, denies pain, denies insomnia Psych: Endorses positive mood Heme/lymph/immuno: denies bruises, no abnormal bleeding   PHYSICAL EXAM  Height:   6 feet 1 inch    Weight: 301 Ibs Constitutional: In no acute distress, well developed and well nourished Cardiovascular: regular rate and rhythm; no edema in BLE Pulmonary: no cough, no increased work of breathing, normal respiratory effort on oxygen supplementation 4L/Min Abdomen: soft, non tender, positive bowel sounds in all quadrants GU:  no suprapubic tenderness Eyes: Normal lids, no discharge, sclera anicteric ENMT: Moist mucous membranes Musculoskeletal: limited range of motion, gets around on his motorized wheelchair  Skin: no rash to visible skin Psych: non-anxious affect Neurological: Weakness but otherwise non focal Heme/lymph/immuno: no bruises, no bleeding  Thank you for the opportunity to participate in the care of James Villegas Please call our office at [phone] if we can be of additional assistance.  Note: Portions of this note were generated with Scientist, clinical (histocompatibility and immunogenetics). Dictation errors may occur despite best attempts at proofreading.  Rosaura Carpenter, NP

## 2020-08-15 DIAGNOSIS — I5020 Unspecified systolic (congestive) heart failure: Secondary | ICD-10-CM | POA: Diagnosis not present

## 2020-08-15 DIAGNOSIS — R293 Abnormal posture: Secondary | ICD-10-CM | POA: Diagnosis not present

## 2020-08-15 DIAGNOSIS — G9341 Metabolic encephalopathy: Secondary | ICD-10-CM | POA: Diagnosis not present

## 2020-08-15 DIAGNOSIS — M6281 Muscle weakness (generalized): Secondary | ICD-10-CM | POA: Diagnosis not present

## 2020-08-15 DIAGNOSIS — J9621 Acute and chronic respiratory failure with hypoxia: Secondary | ICD-10-CM | POA: Diagnosis not present

## 2020-08-15 DIAGNOSIS — I5033 Acute on chronic diastolic (congestive) heart failure: Secondary | ICD-10-CM | POA: Diagnosis not present

## 2020-08-16 DIAGNOSIS — R293 Abnormal posture: Secondary | ICD-10-CM | POA: Diagnosis not present

## 2020-08-16 DIAGNOSIS — M6281 Muscle weakness (generalized): Secondary | ICD-10-CM | POA: Diagnosis not present

## 2020-08-16 DIAGNOSIS — I5033 Acute on chronic diastolic (congestive) heart failure: Secondary | ICD-10-CM | POA: Diagnosis not present

## 2020-08-16 DIAGNOSIS — G9341 Metabolic encephalopathy: Secondary | ICD-10-CM | POA: Diagnosis not present

## 2020-08-16 DIAGNOSIS — J9621 Acute and chronic respiratory failure with hypoxia: Secondary | ICD-10-CM | POA: Diagnosis not present

## 2020-08-17 DIAGNOSIS — J9621 Acute and chronic respiratory failure with hypoxia: Secondary | ICD-10-CM | POA: Diagnosis not present

## 2020-08-17 DIAGNOSIS — M6281 Muscle weakness (generalized): Secondary | ICD-10-CM | POA: Diagnosis not present

## 2020-08-17 DIAGNOSIS — G9341 Metabolic encephalopathy: Secondary | ICD-10-CM | POA: Diagnosis not present

## 2020-08-17 DIAGNOSIS — I5033 Acute on chronic diastolic (congestive) heart failure: Secondary | ICD-10-CM | POA: Diagnosis not present

## 2020-08-17 DIAGNOSIS — R293 Abnormal posture: Secondary | ICD-10-CM | POA: Diagnosis not present

## 2020-08-20 DIAGNOSIS — D6859 Other primary thrombophilia: Secondary | ICD-10-CM | POA: Diagnosis not present

## 2020-08-20 DIAGNOSIS — I5033 Acute on chronic diastolic (congestive) heart failure: Secondary | ICD-10-CM | POA: Diagnosis not present

## 2020-08-20 DIAGNOSIS — R293 Abnormal posture: Secondary | ICD-10-CM | POA: Diagnosis not present

## 2020-08-20 DIAGNOSIS — J9612 Chronic respiratory failure with hypercapnia: Secondary | ICD-10-CM | POA: Diagnosis not present

## 2020-08-20 DIAGNOSIS — J9621 Acute and chronic respiratory failure with hypoxia: Secondary | ICD-10-CM | POA: Diagnosis not present

## 2020-08-20 DIAGNOSIS — R791 Abnormal coagulation profile: Secondary | ICD-10-CM | POA: Diagnosis not present

## 2020-08-20 DIAGNOSIS — I4891 Unspecified atrial fibrillation: Secondary | ICD-10-CM | POA: Diagnosis not present

## 2020-08-20 DIAGNOSIS — G9341 Metabolic encephalopathy: Secondary | ICD-10-CM | POA: Diagnosis not present

## 2020-08-20 DIAGNOSIS — M6281 Muscle weakness (generalized): Secondary | ICD-10-CM | POA: Diagnosis not present

## 2020-08-21 DIAGNOSIS — I5033 Acute on chronic diastolic (congestive) heart failure: Secondary | ICD-10-CM | POA: Diagnosis not present

## 2020-08-21 DIAGNOSIS — R293 Abnormal posture: Secondary | ICD-10-CM | POA: Diagnosis not present

## 2020-08-21 DIAGNOSIS — M6281 Muscle weakness (generalized): Secondary | ICD-10-CM | POA: Diagnosis not present

## 2020-08-21 DIAGNOSIS — J9621 Acute and chronic respiratory failure with hypoxia: Secondary | ICD-10-CM | POA: Diagnosis not present

## 2020-08-21 DIAGNOSIS — G9341 Metabolic encephalopathy: Secondary | ICD-10-CM | POA: Diagnosis not present

## 2020-08-22 DIAGNOSIS — I5033 Acute on chronic diastolic (congestive) heart failure: Secondary | ICD-10-CM | POA: Diagnosis not present

## 2020-08-22 DIAGNOSIS — J9621 Acute and chronic respiratory failure with hypoxia: Secondary | ICD-10-CM | POA: Diagnosis not present

## 2020-08-22 DIAGNOSIS — M6281 Muscle weakness (generalized): Secondary | ICD-10-CM | POA: Diagnosis not present

## 2020-08-22 DIAGNOSIS — R293 Abnormal posture: Secondary | ICD-10-CM | POA: Diagnosis not present

## 2020-08-22 DIAGNOSIS — G9341 Metabolic encephalopathy: Secondary | ICD-10-CM | POA: Diagnosis not present

## 2020-08-23 DIAGNOSIS — J9621 Acute and chronic respiratory failure with hypoxia: Secondary | ICD-10-CM | POA: Diagnosis not present

## 2020-08-23 DIAGNOSIS — I5033 Acute on chronic diastolic (congestive) heart failure: Secondary | ICD-10-CM | POA: Diagnosis not present

## 2020-08-23 DIAGNOSIS — G9341 Metabolic encephalopathy: Secondary | ICD-10-CM | POA: Diagnosis not present

## 2020-08-23 DIAGNOSIS — M6281 Muscle weakness (generalized): Secondary | ICD-10-CM | POA: Diagnosis not present

## 2020-08-23 DIAGNOSIS — R293 Abnormal posture: Secondary | ICD-10-CM | POA: Diagnosis not present

## 2020-08-24 DIAGNOSIS — R293 Abnormal posture: Secondary | ICD-10-CM | POA: Diagnosis not present

## 2020-08-24 DIAGNOSIS — G9341 Metabolic encephalopathy: Secondary | ICD-10-CM | POA: Diagnosis not present

## 2020-08-24 DIAGNOSIS — J9621 Acute and chronic respiratory failure with hypoxia: Secondary | ICD-10-CM | POA: Diagnosis not present

## 2020-08-24 DIAGNOSIS — I5033 Acute on chronic diastolic (congestive) heart failure: Secondary | ICD-10-CM | POA: Diagnosis not present

## 2020-08-24 DIAGNOSIS — M6281 Muscle weakness (generalized): Secondary | ICD-10-CM | POA: Diagnosis not present

## 2020-08-27 DIAGNOSIS — J9621 Acute and chronic respiratory failure with hypoxia: Secondary | ICD-10-CM | POA: Diagnosis not present

## 2020-08-27 DIAGNOSIS — G9341 Metabolic encephalopathy: Secondary | ICD-10-CM | POA: Diagnosis not present

## 2020-08-27 DIAGNOSIS — M6281 Muscle weakness (generalized): Secondary | ICD-10-CM | POA: Diagnosis not present

## 2020-08-27 DIAGNOSIS — R293 Abnormal posture: Secondary | ICD-10-CM | POA: Diagnosis not present

## 2020-08-27 DIAGNOSIS — I5033 Acute on chronic diastolic (congestive) heart failure: Secondary | ICD-10-CM | POA: Diagnosis not present

## 2020-08-28 DIAGNOSIS — J9621 Acute and chronic respiratory failure with hypoxia: Secondary | ICD-10-CM | POA: Diagnosis not present

## 2020-08-28 DIAGNOSIS — G9341 Metabolic encephalopathy: Secondary | ICD-10-CM | POA: Diagnosis not present

## 2020-08-28 DIAGNOSIS — M6281 Muscle weakness (generalized): Secondary | ICD-10-CM | POA: Diagnosis not present

## 2020-08-28 DIAGNOSIS — R293 Abnormal posture: Secondary | ICD-10-CM | POA: Diagnosis not present

## 2020-08-28 DIAGNOSIS — I5033 Acute on chronic diastolic (congestive) heart failure: Secondary | ICD-10-CM | POA: Diagnosis not present

## 2020-08-29 ENCOUNTER — Other Ambulatory Visit: Payer: Self-pay

## 2020-08-29 ENCOUNTER — Encounter: Payer: Self-pay | Admitting: Physician Assistant

## 2020-08-29 ENCOUNTER — Ambulatory Visit (INDEPENDENT_AMBULATORY_CARE_PROVIDER_SITE_OTHER): Payer: Medicare Other | Admitting: Physician Assistant

## 2020-08-29 VITALS — BP 106/59 | HR 83 | Ht 73.0 in | Wt 293.0 lb

## 2020-08-29 DIAGNOSIS — K5901 Slow transit constipation: Secondary | ICD-10-CM | POA: Diagnosis not present

## 2020-08-29 DIAGNOSIS — M6281 Muscle weakness (generalized): Secondary | ICD-10-CM | POA: Diagnosis not present

## 2020-08-29 DIAGNOSIS — I5032 Chronic diastolic (congestive) heart failure: Secondary | ICD-10-CM

## 2020-08-29 DIAGNOSIS — J9621 Acute and chronic respiratory failure with hypoxia: Secondary | ICD-10-CM | POA: Diagnosis not present

## 2020-08-29 DIAGNOSIS — I4821 Permanent atrial fibrillation: Secondary | ICD-10-CM | POA: Diagnosis not present

## 2020-08-29 DIAGNOSIS — G9341 Metabolic encephalopathy: Secondary | ICD-10-CM | POA: Diagnosis not present

## 2020-08-29 DIAGNOSIS — R6889 Other general symptoms and signs: Secondary | ICD-10-CM | POA: Diagnosis not present

## 2020-08-29 DIAGNOSIS — I5033 Acute on chronic diastolic (congestive) heart failure: Secondary | ICD-10-CM | POA: Diagnosis not present

## 2020-08-29 DIAGNOSIS — E662 Morbid (severe) obesity with alveolar hypoventilation: Secondary | ICD-10-CM | POA: Diagnosis not present

## 2020-08-29 DIAGNOSIS — R293 Abnormal posture: Secondary | ICD-10-CM | POA: Diagnosis not present

## 2020-08-29 DIAGNOSIS — R0689 Other abnormalities of breathing: Secondary | ICD-10-CM

## 2020-08-29 NOTE — Patient Instructions (Signed)
Medication Instructions:  Your physician recommends that you continue on your current medications as directed. Please refer to the Current Medication list given to you today.  *If you need a refill on your cardiac medications before your next appointment, please call your pharmacy*   Lab Work: None ordered  If you have labs (blood work) drawn today and your tests are completely normal, you will receive your results only by: Marland Kitchen MyChart Message (if you have MyChart) OR . A paper copy in the mail If you have any lab test that is abnormal or we need to change your treatment, we will call you to review the results.   Testing/Procedures: Your physician has recommended that you have a sleep study. This test records several body functions during sleep, including: brain activity, eye movement, oxygen and carbon dioxide blood levels, heart rate and rhythm, breathing rate and rhythm, the flow of air through your mouth and nose, snoring, body muscle movements, and chest and belly movement.  They will call you to schedule     Follow-Up: At [facility], you and your health needs are our priority.  As part of our continuing mission to provide you with exceptional heart care, we have created designated Provider Care Teams.  These Care Teams include your primary Cardiologist (physician) and Advanced Practice Providers (APPs -  Physician Assistants and Nurse Practitioners) who all work together to provide you with the care you need, when you need it.  We recommend signing up for the patient portal called "MyChart".  Sign up information is provided on this After Visit Summary.  MyChart is used to connect with patients for Virtual Visits (Telemedicine).  Patients are able to view lab/test results, encounter notes, upcoming appointments, etc.  Non-urgent messages can be sent to your provider as well.   To learn more about what you can do with MyChart, go to [email or web address].    Your next appointment:    4 month(s)  The format for your next appointment:   In Person  Provider:     Laurance Flatten, MD  (Dr. Kathreen Cornfield)    Other Instructions

## 2020-08-29 NOTE — Addendum Note (Signed)
Addended by: Bartholome Bill on: 08/29/2020 12:23 PM   Modules accepted: Orders

## 2020-08-29 NOTE — Progress Notes (Addendum)
Cardiology Office Note:    Date:  08/29/2020   ID:  James ForthDavid B Stawicki, DOB [birth year], MRN [id number]  PCP:  Karna DupesBlake, Khashana A, MD  [facility] Cardiologist:  Tobias AlexanderKatarina Nelson, MD  [facility] Electrophysiologist:  None   Chief Complaint: Hospital follow up   History of Present Illness:    James Villegas is a 65 y.o. 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  He was lost to f/u for a period of time and was [facility] and new onset atrial fibrillation/flutter in 2020. He was seen by Dr. Delton SeeNelson. Echocardiogram showed normal LV systolic function, mildly dilated LA. He was treated w/ IV diuretics and diuresed well.  Plan was for rate control strategy.  Was on Coumadin for anticoagulation.  Previously scheduled for sleep study but unable to complete at facility as [facility]  [facility] still [facility].   Admitted with hypoxic and hypercarbic respiratory failure in the setting of acute on chronic diastolic heart failure and obesity hypoventilation syndrome 07/2020. Diuresed well and discharged on Demadex 20mg  BID.   Here today for follow up. Resides at facility.  Uses 4 L oxygen 24/7.  His breathing is stable.  Denies chest pain, palpitation, orthopnea, PND, dizziness or melena.   Past Medical History:  Diagnosis Date  . Acute on chronic congestive heart failure (HCC)   . Anemia, secondary    SECONDARY TO ACUTE BLOOD LOSS  . Anxiety disorder   . Bloody stool 08/29/2011   intermittent along with constipation.   . CVA  (cerebral vascular accident) (HCC) 2010   large left MCA stroke with right hemiparesis  . Depression   . 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 Dysphagia   . Hyperlipidemia   . Hypertension   . Lupus anticoagulant disorder (HCC) 1990  . Morbid obesity (HCC)   . PFO (patent foramen ovale)    TEE 2/10: EF 60%, trivial AI, mild Ao root dilatation, mod PFO with R-L shunting, atrial septal aneurysm;   echo 7/12: EF 65-70%, grade 1 diast dysfxn, mild MR, LVOT showed severe obstruction  . Protein C deficiency (HCC)   . Protein S deficiency (HCC)   . Rectus sheath hematoma 7/12   required reversal of anticoagulation and c/b DVT req. IVC filter  . Seizure disorder [facility](HCC)     Past Surgical History:  Procedure Laterality Date  . dental extraction     multiple  . vena cavogram  12/2010   INFERIOR    Current Medications: Current Meds  Medication Sig  . acetaminophen (TYLENOL) 325 MG tablet Take 650 mg by mouth every 6 (six) hours as needed for mild pain or headache.   . Carboxymeth-Glycerin-Polysorb (REFRESH OPTIVE ADVANCED OP) Place 1 drop into both eyes 4 (four) times daily. Wait 3-5 minutes between eye meds  . cycloSPORINE (RESTASIS) 0.05 % ophthalmic emulsion Place 1 drop into both eyes 2 (two) times daily.  Marland Kitchen. ezetimibe (ZETIA) 10 MG tablet Take 10 mg by mouth daily.  . fish oil-omega-3 fatty acids 1000 MG capsule Take  1 g by mouth 2 (two) times daily with a meal.  . metoprolol tartrate (LOPRESSOR) 25 MG tablet Take 0.5 tablets (12.5 mg total) by mouth 2 (two) times daily.  Marland Kitchen omeprazole (PRILOSEC) 40 MG capsule Take 1 capsule (40 mg total) by mouth daily.  . OXYGEN Inhale into the lungs See admin instructions. Nightly 9p-8a  . phenytoin (DILANTIN) 100 MG ER capsule Take 200 mg by mouth 2 (two) times daily with a meal.  . potassium chloride SA (KLOR-CON) 20 MEQ tablet Take 2 tablets (40 mEq total) by mouth daily.  . rosuvastatin  (CRESTOR) 40 MG tablet Take 40 mg by mouth at bedtime.  . torsemide (DEMADEX) 20 MG tablet Take 1 tablet (20 mg total) by mouth 2 (two) times daily.  Marland Kitchen venlafaxine (EFFEXOR) 75 MG tablet Take 75 mg by mouth daily.     Allergies:   Fenofibrate   Social History   Socioeconomic History  . Marital status: Single    Spouse name: Not on file  . Number of children: Not on file  . Years of education: Not on file  . Highest education level: Not on file  Occupational History  . Not on file  Tobacco Use  . Smoking status: Former Smoker    Packs/day: 1.00    Years: 15.00    Pack years: 15.00    Types: Cigarettes, Cigars    Quit date: 2010    Years since quitting: 12.2  . Smokeless tobacco: Never Used  Vaping Use  . Vaping Use: Never used  Substance and Sexual Activity  . 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 Drug use: No  . Sexual activity: Not on file  Other Topics Concern  . Not on file  Social History Narrative  . Not on file   Social Determinants of Health   Financial Resource Strain: Not on file  Food Insecurity: Not on file  Transportation Needs: Not on file  Physical Activity: Not on file  Stress: Not on file  Social Connections: Not on file     Family History: The patient's family history includes Sleep apnea in his sister.    ROS:   Please see the history of present illness.    All other systems reviewed and are negative.   EKGs/Labs/Other Studies Reviewed:    The following studies were reviewed today:  Echo 07/11/20 1. Left ventricular ejection fraction, by estimation, is 65 to 70%. The  left ventricle has normal function. The left ventricle has no regional  wall motion abnormalities. There is severe eccentric left ventricular  hypertrophy. Left ventricular diastolic  function could not be evaluated.  2. Right ventricular systolic function is normal. The right ventricular  size is normal. There is moderately elevated pulmonary artery systolic   pressure.  3. The mitral valve is normal in structure. Trivial mitral valve  regurgitation. No evidence of mitral stenosis.  4. The aortic valve is grossly normal. Aortic valve regurgitation is  trivial. No aortic stenosis is present.  5. Aortic dilatation noted. There is moderate dilatation of the ascending  aorta, measuring 47 mm.  6. The inferior vena cava is dilated in size with <50% respiratory  variability, suggesting right atrial pressure of 15 mmHg.   EKG:  EKG is not ordered today.   Recent Labs: 07/30/2020: B Natriuretic Peptide 960.3 07/31/2020: TSH 0.459 08/01/2020: ALT 25 08/07/2020: BUN 15; Creatinine, Ser 0.51; Hemoglobin 13.5; Magnesium 2.3; Platelets 241; Potassium 4.3; Sodium 139  Recent  Lipid Panel    Component Value Date/Time   CHOL 207 (H) 07/15/2019 1055   TRIG 91 07/30/2020 1627   HDL 60 07/15/2019 1055   CHOLHDL 3.5 07/15/2019 1055   CHOLHDL 3.8 08/05/2018 0401   VLDL 27 08/05/2018 0401   LDLCALC 122 (H) 07/15/2019 1055     Risk Assessment/Calculations:    CHA2DS2-VASc Score = 4  This indicates a 4.8% annual risk of stroke. The patient's score is based upon: CHF History: Yes HTN History: Yes Diabetes History: No Stroke History: Yes Vascular Disease History: No Age Score: 0 Gender Score: 0   Physical Exam:    VS:  BP (!) 106/59 (BP Location: Other (Comment)) Comment (BP Location): left forearm  Pulse 83   Ht 6\' 1"  (1.854 m)   Wt 293 lb (132.9 kg)   SpO2 99%   BMI 38.66 kg/m     Wt Readings from Last 3 Encounters:  08/29/20 293 lb (132.9 kg)  08/07/20 293 lb 10.4 oz (133.2 kg)  02/27/20 299 lb 9.6 oz (135.9 kg)     GEN: Morbidly obese male in no acute distress on 4 L oxygen HEENT: Normal NECK: No JVD; No carotid bruits LYMPHATICS: No lymphadenopathy CARDIAC: Irregular, no murmurs, rubs, gallops RESPIRATORY:  Clear to auscultation without rales, wheezing or rhonchi  ABDOMEN: Soft, non-tender, non-distended MUSCULOSKELETAL: Trace  right lower extremity edema; No deformity  SKIN: Warm and dry NEUROLOGIC:  Alert and oriented x 3, right-sided weakness  PSYCHIATRIC:  Normal affect   ASSESSMENT AND PLAN:    1. Chronic combined CHF Volume status stable.  Weight down 2 pounds since discharge.  Weight 293 today.  Unable to get daily weight at facility secondary to body habitus and disability.  He requires 02/29/20 for lifting. Continue current medication  2.  Permanent atrial fibrillation Rate stable.  No bleeding issue.  Continue beta-blocker and Coumadin.  3.  Obstructive sleep apnea 4.  Obesity hypoventilation syndrome Currently on 4 L oxygen 24/7.  Previously unable to get sleep study done due to [facility] lift requirement.  Managed by [facility]. [facility].  Medication Adjustments/Labs and Tests Ordered: Current medicines are reviewed at length with the patient today.  Concerns regarding medicines are outlined above.  Orders Placed This Encounter  Procedures  . Split night study   No orders of the defined types were placed in this encounter.   Patient Instructions  Medication Instructions:  Your physician recommends that you continue on your current medications as directed. Please refer to the Current Medication list given to you today.  *If you need a refill on your cardiac medications before your next appointment, please call your pharmacy*   Lab Work: None ordered  If you have labs (blood work) drawn today and your tests are completely normal, you will receive your results only by: Craige Cotta MyChart Message (if you have MyChart) OR . A paper copy in the mail If you have any lab test that is abnormal or we need to change your treatment, we will call you to review the results.   Testing/Procedures: Your physician has recommended that you have a sleep study. This test records several body functions during sleep, including: brain activity, eye movement, oxygen and carbon dioxide blood levels, heart rate and rhythm, breathing  rate and rhythm, the flow of air through your mouth and nose, snoring, body muscle movements, and chest and belly movement.  They will call you to schedule     Follow-Up: At [facility], you and your  health needs are our priority.  As part of our continuing mission to provide you with exceptional heart care, we have created designated Provider Care Teams.  These Care Teams include your primary Cardiologist (physician) and Advanced Practice Providers (APPs -  Physician Assistants and Nurse Practitioners) who all work together to provide you with the care you need, when you need it.  We recommend signing up for the patient portal called "MyChart".  Sign up information is provided on this After Visit Summary.  MyChart is used to connect with patients for Virtual Visits (Telemedicine).  Patients are able to view lab/test results, encounter notes, upcoming appointments, etc.  Non-urgent messages can be sent to your provider as well.   To learn more about what you can do with MyChart, go to [email or web address].    Your next appointment:   4 month(s)  The format for your next appointment:   In Person  Provider:     Laurance Flatten, MD  (Dr. Delton See transfser)    Other Instructions      Signed, Manson Passey, PA  08/29/2020 12:02 PM    [facility]

## 2020-08-29 NOTE — Addendum Note (Signed)
Addended by: Burnetta Sabin on: 08/29/2020 12:11 PM   Modules accepted: Orders

## 2020-08-30 DIAGNOSIS — G9341 Metabolic encephalopathy: Secondary | ICD-10-CM | POA: Diagnosis not present

## 2020-08-30 DIAGNOSIS — R293 Abnormal posture: Secondary | ICD-10-CM | POA: Diagnosis not present

## 2020-08-30 DIAGNOSIS — J9621 Acute and chronic respiratory failure with hypoxia: Secondary | ICD-10-CM | POA: Diagnosis not present

## 2020-08-30 DIAGNOSIS — M6281 Muscle weakness (generalized): Secondary | ICD-10-CM | POA: Diagnosis not present

## 2020-08-30 DIAGNOSIS — I5033 Acute on chronic diastolic (congestive) heart failure: Secondary | ICD-10-CM | POA: Diagnosis not present

## 2020-08-31 DIAGNOSIS — I5033 Acute on chronic diastolic (congestive) heart failure: Secondary | ICD-10-CM | POA: Diagnosis not present

## 2020-08-31 DIAGNOSIS — R293 Abnormal posture: Secondary | ICD-10-CM | POA: Diagnosis not present

## 2020-08-31 DIAGNOSIS — J9621 Acute and chronic respiratory failure with hypoxia: Secondary | ICD-10-CM | POA: Diagnosis not present

## 2020-08-31 DIAGNOSIS — M6281 Muscle weakness (generalized): Secondary | ICD-10-CM | POA: Diagnosis not present

## 2020-08-31 DIAGNOSIS — G9341 Metabolic encephalopathy: Secondary | ICD-10-CM | POA: Diagnosis not present

## 2020-09-03 DIAGNOSIS — I5033 Acute on chronic diastolic (congestive) heart failure: Secondary | ICD-10-CM | POA: Diagnosis not present

## 2020-09-03 DIAGNOSIS — R293 Abnormal posture: Secondary | ICD-10-CM | POA: Diagnosis not present

## 2020-09-03 DIAGNOSIS — J9621 Acute and chronic respiratory failure with hypoxia: Secondary | ICD-10-CM | POA: Diagnosis not present

## 2020-09-03 DIAGNOSIS — G9341 Metabolic encephalopathy: Secondary | ICD-10-CM | POA: Diagnosis not present

## 2020-09-03 DIAGNOSIS — M6281 Muscle weakness (generalized): Secondary | ICD-10-CM | POA: Diagnosis not present

## 2020-09-04 DIAGNOSIS — R293 Abnormal posture: Secondary | ICD-10-CM | POA: Diagnosis not present

## 2020-09-04 DIAGNOSIS — G9341 Metabolic encephalopathy: Secondary | ICD-10-CM | POA: Diagnosis not present

## 2020-09-04 DIAGNOSIS — J9621 Acute and chronic respiratory failure with hypoxia: Secondary | ICD-10-CM | POA: Diagnosis not present

## 2020-09-04 DIAGNOSIS — I5033 Acute on chronic diastolic (congestive) heart failure: Secondary | ICD-10-CM | POA: Diagnosis not present

## 2020-09-04 DIAGNOSIS — M6281 Muscle weakness (generalized): Secondary | ICD-10-CM | POA: Diagnosis not present

## 2020-09-05 DIAGNOSIS — I5032 Chronic diastolic (congestive) heart failure: Secondary | ICD-10-CM | POA: Diagnosis not present

## 2020-09-05 DIAGNOSIS — R635 Abnormal weight gain: Secondary | ICD-10-CM | POA: Diagnosis not present

## 2020-09-05 DIAGNOSIS — S31109A Unspecified open wound of abdominal wall, unspecified quadrant without penetration into peritoneal cavity, initial encounter: Secondary | ICD-10-CM | POA: Diagnosis not present

## 2020-09-10 DIAGNOSIS — I480 Paroxysmal atrial fibrillation: Secondary | ICD-10-CM | POA: Diagnosis not present

## 2020-09-10 DIAGNOSIS — I69359 Hemiplegia and hemiparesis following cerebral infarction affecting unspecified side: Secondary | ICD-10-CM | POA: Diagnosis not present

## 2020-09-10 DIAGNOSIS — E7849 Other hyperlipidemia: Secondary | ICD-10-CM | POA: Diagnosis not present

## 2020-09-10 DIAGNOSIS — J9612 Chronic respiratory failure with hypercapnia: Secondary | ICD-10-CM | POA: Diagnosis not present

## 2020-09-10 DIAGNOSIS — I5032 Chronic diastolic (congestive) heart failure: Secondary | ICD-10-CM | POA: Diagnosis not present

## 2020-09-10 DIAGNOSIS — D6859 Other primary thrombophilia: Secondary | ICD-10-CM | POA: Diagnosis not present

## 2020-09-10 DIAGNOSIS — G4089 Other seizures: Secondary | ICD-10-CM | POA: Diagnosis not present

## 2020-09-12 DIAGNOSIS — A084 Viral intestinal infection, unspecified: Secondary | ICD-10-CM | POA: Diagnosis not present

## 2020-09-12 DIAGNOSIS — I480 Paroxysmal atrial fibrillation: Secondary | ICD-10-CM | POA: Diagnosis not present

## 2020-09-12 DIAGNOSIS — R002 Palpitations: Secondary | ICD-10-CM | POA: Diagnosis not present

## 2020-09-13 ENCOUNTER — Other Ambulatory Visit: Payer: Self-pay

## 2020-09-13 ENCOUNTER — Ambulatory Visit (HOSPITAL_COMMUNITY)
Admission: RE | Admit: 2020-09-13 | Discharge: 2020-09-13 | Disposition: A | Discharge status: Home / Self Care | Payer: Medicare Other | Source: Ambulatory Visit | Attending: Cardiology | Admitting: Cardiology

## 2020-09-13 DIAGNOSIS — I7121 Aneurysm of the ascending aorta, without rupture: Secondary | ICD-10-CM

## 2020-09-13 DIAGNOSIS — R911 Solitary pulmonary nodule: Secondary | ICD-10-CM | POA: Diagnosis not present

## 2020-09-13 DIAGNOSIS — I712 Thoracic aortic aneurysm, without rupture: Secondary | ICD-10-CM | POA: Insufficient documentation

## 2020-09-13 DIAGNOSIS — J9811 Atelectasis: Secondary | ICD-10-CM | POA: Diagnosis not present

## 2020-09-13 LAB — POCT I-STAT CREATININE: Creatinine, Ser: 0.6 mg/dL — ABNORMAL LOW (ref 0.61–1.24)

## 2020-09-13 MED ORDER — IOHEXOL 350 MG/ML SOLN
100.0000 mL | Freq: Once | INTRAVENOUS | Status: AC | PRN
  Administered 2020-09-13: 100 mL via INTRAVENOUS

## 2020-09-14 ENCOUNTER — Ambulatory Visit: Payer: Medicare Other | Admitting: Pulmonary Disease

## 2020-09-17 ENCOUNTER — Telehealth: Payer: Self-pay | Admitting: *Deleted

## 2020-09-17 DIAGNOSIS — I7121 Aneurysm of the ascending aorta, without rupture: Secondary | ICD-10-CM

## 2020-09-17 DIAGNOSIS — I712 Thoracic aortic aneurysm, without rupture: Secondary | ICD-10-CM

## 2020-09-17 NOTE — Telephone Encounter (Signed)
James Sprague, MD  09/14/2020 12:26 PM EDT      CT scan shows that the ascending aorta is mildly dilated at 4.2cm. We will follow with yearly CTA and MRA.      Spoke with the pt and his Sister James Villegas on the phone, and endorsed to them both the pts CT Angio Chest Aorta results and recommendations per Dr. Shari Prows, for him to have this repeated in ONE YEAR, to follow-up on ascending aortic dilation of 4.2 cm noted on this study.  Informed both the pt and his sister that I will go ahead and place the order in the system and send a message to our [facility] Schedulers to call him back and arrange for this test to be done in one year.  Pt states our scheduling dept will need to call his Nursing Home [facility], to arrange this appt, for they provide his transportation. Also note will need to be placed to [facility]-CT to draw his ISTAT Creatinine same day as he comes for this repeat study, due to pt being unable to commute back and forth to the office for labs, for he is in a SNF [place].  ISTAT creatinine was done at the bedside with this previous study.  Both pt and sister James Villegas verbalized understanding and agrees with this plan. Contact number for [facility] is [phone].

## 2020-10-01 DIAGNOSIS — G40909 Epilepsy, unspecified, not intractable, without status epilepticus: Secondary | ICD-10-CM | POA: Diagnosis not present

## 2020-10-01 DIAGNOSIS — D6859 Other primary thrombophilia: Secondary | ICD-10-CM | POA: Diagnosis not present

## 2020-10-01 DIAGNOSIS — J9612 Chronic respiratory failure with hypercapnia: Secondary | ICD-10-CM | POA: Diagnosis not present

## 2020-10-01 DIAGNOSIS — E785 Hyperlipidemia, unspecified: Secondary | ICD-10-CM | POA: Diagnosis not present

## 2020-10-03 DIAGNOSIS — I4891 Unspecified atrial fibrillation: Secondary | ICD-10-CM | POA: Diagnosis not present

## 2020-10-03 DIAGNOSIS — J9612 Chronic respiratory failure with hypercapnia: Secondary | ICD-10-CM | POA: Diagnosis not present

## 2020-10-03 DIAGNOSIS — Z7901 Long term (current) use of anticoagulants: Secondary | ICD-10-CM | POA: Diagnosis not present

## 2020-10-03 DIAGNOSIS — S31109D Unspecified open wound of abdominal wall, unspecified quadrant without penetration into peritoneal cavity, subsequent encounter: Secondary | ICD-10-CM | POA: Diagnosis not present

## 2020-10-04 DIAGNOSIS — Z23 Encounter for immunization: Secondary | ICD-10-CM | POA: Diagnosis not present

## 2020-10-29 DIAGNOSIS — Z79899 Other long term (current) drug therapy: Secondary | ICD-10-CM | POA: Diagnosis not present

## 2020-10-31 DIAGNOSIS — J9612 Chronic respiratory failure with hypercapnia: Secondary | ICD-10-CM | POA: Diagnosis not present

## 2020-10-31 DIAGNOSIS — D6859 Other primary thrombophilia: Secondary | ICD-10-CM | POA: Diagnosis not present

## 2020-10-31 DIAGNOSIS — E662 Morbid (severe) obesity with alveolar hypoventilation: Secondary | ICD-10-CM | POA: Diagnosis not present

## 2020-11-07 DIAGNOSIS — H2513 Age-related nuclear cataract, bilateral: Secondary | ICD-10-CM | POA: Diagnosis not present

## 2020-11-07 DIAGNOSIS — H524 Presbyopia: Secondary | ICD-10-CM | POA: Diagnosis not present

## 2020-11-07 DIAGNOSIS — H43813 Vitreous degeneration, bilateral: Secondary | ICD-10-CM | POA: Diagnosis not present

## 2020-11-07 DIAGNOSIS — H04123 Dry eye syndrome of bilateral lacrimal glands: Secondary | ICD-10-CM | POA: Diagnosis not present

## 2020-11-12 DIAGNOSIS — I69359 Hemiplegia and hemiparesis following cerebral infarction affecting unspecified side: Secondary | ICD-10-CM | POA: Diagnosis not present

## 2020-11-12 DIAGNOSIS — J9612 Chronic respiratory failure with hypercapnia: Secondary | ICD-10-CM | POA: Diagnosis not present

## 2020-11-12 DIAGNOSIS — L89301 Pressure ulcer of unspecified buttock, stage 1: Secondary | ICD-10-CM | POA: Diagnosis not present

## 2020-11-12 DIAGNOSIS — G40909 Epilepsy, unspecified, not intractable, without status epilepticus: Secondary | ICD-10-CM | POA: Diagnosis not present

## 2020-11-12 DIAGNOSIS — I5032 Chronic diastolic (congestive) heart failure: Secondary | ICD-10-CM | POA: Diagnosis not present

## 2020-11-12 DIAGNOSIS — I4891 Unspecified atrial fibrillation: Secondary | ICD-10-CM | POA: Diagnosis not present

## 2020-11-12 DIAGNOSIS — E785 Hyperlipidemia, unspecified: Secondary | ICD-10-CM | POA: Diagnosis not present

## 2020-11-15 DIAGNOSIS — G8101 Flaccid hemiplegia affecting right dominant side: Secondary | ICD-10-CM | POA: Diagnosis not present

## 2020-11-15 DIAGNOSIS — J9620 Acute and chronic respiratory failure, unspecified whether with hypoxia or hypercapnia: Secondary | ICD-10-CM | POA: Diagnosis not present

## 2020-11-15 DIAGNOSIS — I6930 Unspecified sequelae of cerebral infarction: Secondary | ICD-10-CM | POA: Diagnosis not present

## 2020-11-15 DIAGNOSIS — I69359 Hemiplegia and hemiparesis following cerebral infarction affecting unspecified side: Secondary | ICD-10-CM | POA: Diagnosis not present

## 2020-11-16 DIAGNOSIS — I6930 Unspecified sequelae of cerebral infarction: Secondary | ICD-10-CM | POA: Diagnosis not present

## 2020-11-16 DIAGNOSIS — J9620 Acute and chronic respiratory failure, unspecified whether with hypoxia or hypercapnia: Secondary | ICD-10-CM | POA: Diagnosis not present

## 2020-11-16 DIAGNOSIS — I69359 Hemiplegia and hemiparesis following cerebral infarction affecting unspecified side: Secondary | ICD-10-CM | POA: Diagnosis not present

## 2020-11-16 DIAGNOSIS — G8101 Flaccid hemiplegia affecting right dominant side: Secondary | ICD-10-CM | POA: Diagnosis not present

## 2020-11-19 DIAGNOSIS — I6930 Unspecified sequelae of cerebral infarction: Secondary | ICD-10-CM | POA: Diagnosis not present

## 2020-11-19 DIAGNOSIS — I69359 Hemiplegia and hemiparesis following cerebral infarction affecting unspecified side: Secondary | ICD-10-CM | POA: Diagnosis not present

## 2020-11-19 DIAGNOSIS — J9620 Acute and chronic respiratory failure, unspecified whether with hypoxia or hypercapnia: Secondary | ICD-10-CM | POA: Diagnosis not present

## 2020-11-19 DIAGNOSIS — G8101 Flaccid hemiplegia affecting right dominant side: Secondary | ICD-10-CM | POA: Diagnosis not present

## 2020-11-20 DIAGNOSIS — I6930 Unspecified sequelae of cerebral infarction: Secondary | ICD-10-CM | POA: Diagnosis not present

## 2020-11-20 DIAGNOSIS — J9620 Acute and chronic respiratory failure, unspecified whether with hypoxia or hypercapnia: Secondary | ICD-10-CM | POA: Diagnosis not present

## 2020-11-20 DIAGNOSIS — G8101 Flaccid hemiplegia affecting right dominant side: Secondary | ICD-10-CM | POA: Diagnosis not present

## 2020-11-20 DIAGNOSIS — I69359 Hemiplegia and hemiparesis following cerebral infarction affecting unspecified side: Secondary | ICD-10-CM | POA: Diagnosis not present

## 2020-11-21 DIAGNOSIS — G8101 Flaccid hemiplegia affecting right dominant side: Secondary | ICD-10-CM | POA: Diagnosis not present

## 2020-11-21 DIAGNOSIS — J9620 Acute and chronic respiratory failure, unspecified whether with hypoxia or hypercapnia: Secondary | ICD-10-CM | POA: Diagnosis not present

## 2020-11-21 DIAGNOSIS — I69359 Hemiplegia and hemiparesis following cerebral infarction affecting unspecified side: Secondary | ICD-10-CM | POA: Diagnosis not present

## 2020-11-21 DIAGNOSIS — I6930 Unspecified sequelae of cerebral infarction: Secondary | ICD-10-CM | POA: Diagnosis not present

## 2020-11-22 DIAGNOSIS — G8101 Flaccid hemiplegia affecting right dominant side: Secondary | ICD-10-CM | POA: Diagnosis not present

## 2020-11-22 DIAGNOSIS — I69359 Hemiplegia and hemiparesis following cerebral infarction affecting unspecified side: Secondary | ICD-10-CM | POA: Diagnosis not present

## 2020-11-22 DIAGNOSIS — J9620 Acute and chronic respiratory failure, unspecified whether with hypoxia or hypercapnia: Secondary | ICD-10-CM | POA: Diagnosis not present

## 2020-11-22 DIAGNOSIS — I6930 Unspecified sequelae of cerebral infarction: Secondary | ICD-10-CM | POA: Diagnosis not present

## 2020-11-23 DIAGNOSIS — J9620 Acute and chronic respiratory failure, unspecified whether with hypoxia or hypercapnia: Secondary | ICD-10-CM | POA: Diagnosis not present

## 2020-11-23 DIAGNOSIS — G8101 Flaccid hemiplegia affecting right dominant side: Secondary | ICD-10-CM | POA: Diagnosis not present

## 2020-11-23 DIAGNOSIS — I69359 Hemiplegia and hemiparesis following cerebral infarction affecting unspecified side: Secondary | ICD-10-CM | POA: Diagnosis not present

## 2020-11-23 DIAGNOSIS — I6930 Unspecified sequelae of cerebral infarction: Secondary | ICD-10-CM | POA: Diagnosis not present

## 2020-11-26 DIAGNOSIS — I6930 Unspecified sequelae of cerebral infarction: Secondary | ICD-10-CM | POA: Diagnosis not present

## 2020-11-26 DIAGNOSIS — G8101 Flaccid hemiplegia affecting right dominant side: Secondary | ICD-10-CM | POA: Diagnosis not present

## 2020-11-26 DIAGNOSIS — I69359 Hemiplegia and hemiparesis following cerebral infarction affecting unspecified side: Secondary | ICD-10-CM | POA: Diagnosis not present

## 2020-11-26 DIAGNOSIS — J9620 Acute and chronic respiratory failure, unspecified whether with hypoxia or hypercapnia: Secondary | ICD-10-CM | POA: Diagnosis not present

## 2020-11-27 DIAGNOSIS — G8101 Flaccid hemiplegia affecting right dominant side: Secondary | ICD-10-CM | POA: Diagnosis not present

## 2020-11-27 DIAGNOSIS — I6930 Unspecified sequelae of cerebral infarction: Secondary | ICD-10-CM | POA: Diagnosis not present

## 2020-11-27 DIAGNOSIS — B351 Tinea unguium: Secondary | ICD-10-CM | POA: Diagnosis not present

## 2020-11-27 DIAGNOSIS — J9620 Acute and chronic respiratory failure, unspecified whether with hypoxia or hypercapnia: Secondary | ICD-10-CM | POA: Diagnosis not present

## 2020-11-27 DIAGNOSIS — I69359 Hemiplegia and hemiparesis following cerebral infarction affecting unspecified side: Secondary | ICD-10-CM | POA: Diagnosis not present

## 2020-11-27 DIAGNOSIS — I739 Peripheral vascular disease, unspecified: Secondary | ICD-10-CM | POA: Diagnosis not present

## 2020-11-28 DIAGNOSIS — J9620 Acute and chronic respiratory failure, unspecified whether with hypoxia or hypercapnia: Secondary | ICD-10-CM | POA: Diagnosis not present

## 2020-11-28 DIAGNOSIS — I69359 Hemiplegia and hemiparesis following cerebral infarction affecting unspecified side: Secondary | ICD-10-CM | POA: Diagnosis not present

## 2020-11-28 DIAGNOSIS — I6930 Unspecified sequelae of cerebral infarction: Secondary | ICD-10-CM | POA: Diagnosis not present

## 2020-11-28 DIAGNOSIS — G8101 Flaccid hemiplegia affecting right dominant side: Secondary | ICD-10-CM | POA: Diagnosis not present

## 2020-11-29 ENCOUNTER — Non-Acute Institutional Stay: Payer: Medicare Other | Admitting: Hospice

## 2020-11-29 ENCOUNTER — Other Ambulatory Visit: Payer: Self-pay

## 2020-11-29 DIAGNOSIS — I6930 Unspecified sequelae of cerebral infarction: Secondary | ICD-10-CM | POA: Diagnosis not present

## 2020-11-29 DIAGNOSIS — I5032 Chronic diastolic (congestive) heart failure: Secondary | ICD-10-CM

## 2020-11-29 DIAGNOSIS — J9620 Acute and chronic respiratory failure, unspecified whether with hypoxia or hypercapnia: Secondary | ICD-10-CM | POA: Diagnosis not present

## 2020-11-29 DIAGNOSIS — G8101 Flaccid hemiplegia affecting right dominant side: Secondary | ICD-10-CM | POA: Diagnosis not present

## 2020-11-29 DIAGNOSIS — R0602 Shortness of breath: Secondary | ICD-10-CM | POA: Diagnosis not present

## 2020-11-29 DIAGNOSIS — I69359 Hemiplegia and hemiparesis following cerebral infarction affecting unspecified side: Secondary | ICD-10-CM | POA: Diagnosis not present

## 2020-11-29 DIAGNOSIS — Z515 Encounter for palliative care: Secondary | ICD-10-CM | POA: Diagnosis not present

## 2020-11-29 NOTE — Progress Notes (Signed)
Therapist, nutritional Palliative Care Consult Note Telephone: [phone]  Fax: [phone]  PATIENT NAME: James Villegas DOB: May 01, 1956 MRN: [id number]  PRIMARY CARE PROVIDER:   Karna Dupes, MD Karna Dupes, MD [address] [place],  [place] [address]  REFERRING PROVIDER: Karna Dupes, MD Karna Dupes, MD [address] [place],  [place] [address]   RESPONSIBLE PARTY: Self/Sister [name] [phone] Contact Information     Name Relation Home Work Mobile   [place] Sister [phone]  [phone]       Visit is to build trust and highlight Palliative Medicine as specialized medical care for people living with serious illness, aimed at facilitating better quality of life through symptoms relief, assisting with advance care planning and complex medical decision making. This is a follow up visit.  RECOMMENDATIONS/PLAN:    CODE STATUS: Patient is a Do Not Resuscitate   GOALS OF CARE: Goals of care include to maximize quality of life and symptom management.   MOST selections include comfort measures antibiotics if indicated, no IV fluids, no feeding tube.  Both signed DNR and MOST form uploaded to [facility] health electronic medical record epic.  Visit consisted of counseling and education dealing with the complex and emotionally intense issues of symptom management and palliative care in the setting of serious and potentially life-threatening illness. Palliative care team will continue to support patient, patient's family, and medical team.  Symptom management/Plan:  Shortness of breath: Managed with continuous oxygen supplementation  3L/Min. CHF:  Education provided on Fluid restriction 1580ml/24 hours, no added salt, Elevate BLE. Continue Torsemide as ordered.  Afib: Continue Coumadin and INR check as ordered.  Comfort measures. Follow up: Palliative care will continue to follow for complex medical decision  making, advance care planning, and clarification of goals. Return 6 weeks or prn. Encouraged to call provider sooner with any concerns.  CHIEF COMPLAINT: Palliative follow up  HISTORY OF PRESENT ILLNESS:  James Villegas a 65 y.o. male with multiple medical problems including shortness of breath related to chronic diastolic CHF, History of Afib on coumadin, R hemiplegia hemiparesis following CVA, Depression.  Patient denies difficulty breathing, pain or discomfort; denies depressed mood.  History obtained from review of EMR, discussion with primary team, family and/or patient. Records reviewed and summarized above. All 10 point systems reviewed and are negative except as documented in history of present illness above  Review and summarization of Epic records shows history from other than patient.   Palliative Care was asked to follow this patient o help address complex decision making in the context of advance care planning and goals of care clarification.   PHYSICAL EXAM  General: In no acute distress, appropriately dressed Cardiovascular: regular rate and rhythm; non pitting edema to right lower ankle Pulmonary: no cough, no increased work of breathing, normal respiratory effort on 3 L of oxygen per minute Abdomen: soft, non tender, no guarding, positive bowel sounds in all quadrants GU:  no suprapubic tenderness Eyes: Normal lids, no discharge ENMT: Moist mucous membranes Musculoskeletal:  weakness, gets around with power scooter Skin: no rash to visible skin, warm without cyanosis,  Psych: non-anxious affect Neurological: Weakness but otherwise non focal Heme/lymph/immuno: no bruises, no bleeding  PERTINENT MEDICATIONS:  Outpatient Encounter Medications as of 11/29/2020  Medication Sig   acetaminophen (TYLENOL) 325 MG tablet Take 650 mg by mouth every 6 (six) hours as needed for mild pain or headache.  Carboxymeth-Glycerin-Polysorb (REFRESH OPTIVE ADVANCED OP) Place 1 drop into both  eyes 4 (four) times daily. Wait 3-5 minutes between eye meds   cycloSPORINE (RESTASIS) 0.05 % ophthalmic emulsion Place 1 drop into both eyes 2 (two) times daily.   ezetimibe (ZETIA) 10 MG tablet Take 10 mg by mouth daily.   fish oil-omega-3 fatty acids 1000 MG capsule Take 1 g by mouth 2 (two) times daily with a meal.   metoprolol tartrate (LOPRESSOR) 25 MG tablet Take 0.5 tablets (12.5 mg total) by mouth 2 (two) times daily.   omeprazole (PRILOSEC) 40 MG capsule Take 1 capsule (40 mg total) by mouth daily.   OXYGEN Inhale into the lungs See admin instructions. Nightly 9p-8a   phenytoin (DILANTIN) 100 MG ER capsule Take 200 mg by mouth 2 (two) times daily with a meal.   potassium chloride SA (KLOR-CON) 20 MEQ tablet Take 2 tablets (40 mEq total) by mouth daily.   rosuvastatin (CRESTOR) 40 MG tablet Take 40 mg by mouth at bedtime.   torsemide (DEMADEX) 20 MG tablet Take 1 tablet (20 mg total) by mouth 2 (two) times daily.   venlafaxine (EFFEXOR) 75 MG tablet Take 75 mg by mouth daily.   warfarin (COUMADIN) 5 MG tablet Take 1 tablet (5 mg total) by mouth daily for 10 days.   No facility-administered encounter medications on file as of 11/29/2020.    HOSPICE ELIGIBILITY/DIAGNOSIS: TBD  PAST MEDICAL HISTORY:  Past Medical History:  Diagnosis Date   Acute on chronic congestive heart failure (HCC)    Anemia, secondary    SECONDARY TO ACUTE BLOOD LOSS   Anxiety disorder    Bloody stool 08/29/2011   intermittent along with constipation.    CVA (cerebral vascular accident) (HCC) 2010   large left MCA stroke with right hemiparesis   Depression    DVT of lower extremity (deep venous thrombosis) (HCC)    RIGHT LOWER; s/p IVC filter 7/12   Dysphagia    Hyperlipidemia    Hypertension    Lupus anticoagulant disorder (HCC) 1990   Morbid obesity (HCC)    PFO (patent foramen ovale)    TEE 2/10: EF 60%, trivial AI, mild Ao root dilatation, mod PFO with R-L shunting, atrial septal aneurysm;   echo  7/12: EF 65-70%, grade 1 diast dysfxn, mild MR, LVOT showed severe obstruction   Protein C deficiency (HCC)    Protein S deficiency (HCC)    Rectus sheath hematoma 7/12   required reversal of anticoagulation and c/b DVT req. IVC filter   Seizure disorder (HCC)      Review lab tests/diagnostics No results for input(s): WBC, HGB, HCT, PLT, MCV in the last 168 hours. No results for input(s): NA, K, CL, CO2, BUN, CREATININE, GLUCOSE in the last 168 hours. CrCl cannot be calculated (Patient's most recent lab result is older than the maximum 21 days allowed.).  ALLERGIES:  Allergies  Allergen Reactions   Fenofibrate Other (See Comments)    Per MAR      I spent 50 minutes providing this consultation; this includes time spent with patient/family, chart review and documentation. More than 50% of the time in this consultation was spent on counseling and coordinating communication   Thank you for the opportunity to participate in the care of James Villegas Please call our office at [phone] if we can be of additional assistance.  Note: Portions of this note were generated with Scientist, clinical (histocompatibility and immunogenetics). Dictation errors may occur despite best attempts at proofreading.  Teodoro Spray, NP

## 2020-12-03 DIAGNOSIS — I69359 Hemiplegia and hemiparesis following cerebral infarction affecting unspecified side: Secondary | ICD-10-CM | POA: Diagnosis not present

## 2020-12-03 DIAGNOSIS — G8101 Flaccid hemiplegia affecting right dominant side: Secondary | ICD-10-CM | POA: Diagnosis not present

## 2020-12-03 DIAGNOSIS — J9620 Acute and chronic respiratory failure, unspecified whether with hypoxia or hypercapnia: Secondary | ICD-10-CM | POA: Diagnosis not present

## 2020-12-03 DIAGNOSIS — I6930 Unspecified sequelae of cerebral infarction: Secondary | ICD-10-CM | POA: Diagnosis not present

## 2020-12-04 DIAGNOSIS — G8101 Flaccid hemiplegia affecting right dominant side: Secondary | ICD-10-CM | POA: Diagnosis not present

## 2020-12-04 DIAGNOSIS — I69359 Hemiplegia and hemiparesis following cerebral infarction affecting unspecified side: Secondary | ICD-10-CM | POA: Diagnosis not present

## 2020-12-04 DIAGNOSIS — J9620 Acute and chronic respiratory failure, unspecified whether with hypoxia or hypercapnia: Secondary | ICD-10-CM | POA: Diagnosis not present

## 2020-12-04 DIAGNOSIS — I6930 Unspecified sequelae of cerebral infarction: Secondary | ICD-10-CM | POA: Diagnosis not present

## 2020-12-05 DIAGNOSIS — I69359 Hemiplegia and hemiparesis following cerebral infarction affecting unspecified side: Secondary | ICD-10-CM | POA: Diagnosis not present

## 2020-12-05 DIAGNOSIS — I6930 Unspecified sequelae of cerebral infarction: Secondary | ICD-10-CM | POA: Diagnosis not present

## 2020-12-05 DIAGNOSIS — J9620 Acute and chronic respiratory failure, unspecified whether with hypoxia or hypercapnia: Secondary | ICD-10-CM | POA: Diagnosis not present

## 2020-12-05 DIAGNOSIS — G8101 Flaccid hemiplegia affecting right dominant side: Secondary | ICD-10-CM | POA: Diagnosis not present

## 2020-12-06 DIAGNOSIS — I6930 Unspecified sequelae of cerebral infarction: Secondary | ICD-10-CM | POA: Diagnosis not present

## 2020-12-06 DIAGNOSIS — G8101 Flaccid hemiplegia affecting right dominant side: Secondary | ICD-10-CM | POA: Diagnosis not present

## 2020-12-06 DIAGNOSIS — J9620 Acute and chronic respiratory failure, unspecified whether with hypoxia or hypercapnia: Secondary | ICD-10-CM | POA: Diagnosis not present

## 2020-12-06 DIAGNOSIS — I69359 Hemiplegia and hemiparesis following cerebral infarction affecting unspecified side: Secondary | ICD-10-CM | POA: Diagnosis not present

## 2020-12-07 DIAGNOSIS — I69359 Hemiplegia and hemiparesis following cerebral infarction affecting unspecified side: Secondary | ICD-10-CM | POA: Diagnosis not present

## 2020-12-07 DIAGNOSIS — J9620 Acute and chronic respiratory failure, unspecified whether with hypoxia or hypercapnia: Secondary | ICD-10-CM | POA: Diagnosis not present

## 2020-12-07 DIAGNOSIS — G8101 Flaccid hemiplegia affecting right dominant side: Secondary | ICD-10-CM | POA: Diagnosis not present

## 2020-12-07 DIAGNOSIS — I6930 Unspecified sequelae of cerebral infarction: Secondary | ICD-10-CM | POA: Diagnosis not present

## 2020-12-10 DIAGNOSIS — I6930 Unspecified sequelae of cerebral infarction: Secondary | ICD-10-CM | POA: Diagnosis not present

## 2020-12-10 DIAGNOSIS — I69359 Hemiplegia and hemiparesis following cerebral infarction affecting unspecified side: Secondary | ICD-10-CM | POA: Diagnosis not present

## 2020-12-10 DIAGNOSIS — J9620 Acute and chronic respiratory failure, unspecified whether with hypoxia or hypercapnia: Secondary | ICD-10-CM | POA: Diagnosis not present

## 2020-12-10 DIAGNOSIS — G8101 Flaccid hemiplegia affecting right dominant side: Secondary | ICD-10-CM | POA: Diagnosis not present

## 2020-12-11 DIAGNOSIS — I6930 Unspecified sequelae of cerebral infarction: Secondary | ICD-10-CM | POA: Diagnosis not present

## 2020-12-11 DIAGNOSIS — J9620 Acute and chronic respiratory failure, unspecified whether with hypoxia or hypercapnia: Secondary | ICD-10-CM | POA: Diagnosis not present

## 2020-12-11 DIAGNOSIS — G8101 Flaccid hemiplegia affecting right dominant side: Secondary | ICD-10-CM | POA: Diagnosis not present

## 2020-12-11 DIAGNOSIS — I69359 Hemiplegia and hemiparesis following cerebral infarction affecting unspecified side: Secondary | ICD-10-CM | POA: Diagnosis not present

## 2020-12-12 DIAGNOSIS — U071 COVID-19: Secondary | ICD-10-CM | POA: Diagnosis not present

## 2020-12-12 DIAGNOSIS — R3989 Other symptoms and signs involving the genitourinary system: Secondary | ICD-10-CM | POA: Diagnosis not present

## 2020-12-25 DIAGNOSIS — M25561 Pain in right knee: Secondary | ICD-10-CM | POA: Diagnosis not present

## 2020-12-25 DIAGNOSIS — M1711 Unilateral primary osteoarthritis, right knee: Secondary | ICD-10-CM | POA: Diagnosis not present

## 2020-12-25 DIAGNOSIS — S8254XA Nondisplaced fracture of medial malleolus of right tibia, initial encounter for closed fracture: Secondary | ICD-10-CM | POA: Diagnosis not present

## 2020-12-26 DIAGNOSIS — S99922A Unspecified injury of left foot, initial encounter: Secondary | ICD-10-CM | POA: Diagnosis not present

## 2020-12-26 DIAGNOSIS — R262 Difficulty in walking, not elsewhere classified: Secondary | ICD-10-CM | POA: Diagnosis not present

## 2020-12-26 DIAGNOSIS — S82899A Other fracture of unspecified lower leg, initial encounter for closed fracture: Secondary | ICD-10-CM | POA: Diagnosis not present

## 2020-12-27 DIAGNOSIS — I69359 Hemiplegia and hemiparesis following cerebral infarction affecting unspecified side: Secondary | ICD-10-CM | POA: Diagnosis not present

## 2020-12-27 DIAGNOSIS — I6930 Unspecified sequelae of cerebral infarction: Secondary | ICD-10-CM | POA: Diagnosis not present

## 2020-12-27 DIAGNOSIS — G8101 Flaccid hemiplegia affecting right dominant side: Secondary | ICD-10-CM | POA: Diagnosis not present

## 2020-12-27 DIAGNOSIS — J9620 Acute and chronic respiratory failure, unspecified whether with hypoxia or hypercapnia: Secondary | ICD-10-CM | POA: Diagnosis not present

## 2020-12-31 DIAGNOSIS — S82871A Displaced pilon fracture of right tibia, initial encounter for closed fracture: Secondary | ICD-10-CM | POA: Diagnosis not present

## 2021-01-04 DIAGNOSIS — S82301D Unspecified fracture of lower end of right tibia, subsequent encounter for closed fracture with routine healing: Secondary | ICD-10-CM | POA: Diagnosis not present

## 2021-01-04 DIAGNOSIS — I69359 Hemiplegia and hemiparesis following cerebral infarction affecting unspecified side: Secondary | ICD-10-CM | POA: Diagnosis not present

## 2021-01-09 DIAGNOSIS — G40909 Epilepsy, unspecified, not intractable, without status epilepticus: Secondary | ICD-10-CM | POA: Diagnosis not present

## 2021-01-09 DIAGNOSIS — J9612 Chronic respiratory failure with hypercapnia: Secondary | ICD-10-CM | POA: Diagnosis not present

## 2021-01-09 DIAGNOSIS — I5032 Chronic diastolic (congestive) heart failure: Secondary | ICD-10-CM | POA: Diagnosis not present

## 2021-01-09 DIAGNOSIS — E662 Morbid (severe) obesity with alveolar hypoventilation: Secondary | ICD-10-CM | POA: Diagnosis not present

## 2021-01-16 DIAGNOSIS — S8254XD Nondisplaced fracture of medial malleolus of right tibia, subsequent encounter for closed fracture with routine healing: Secondary | ICD-10-CM | POA: Diagnosis not present

## 2021-01-16 DIAGNOSIS — R293 Abnormal posture: Secondary | ICD-10-CM | POA: Diagnosis not present

## 2021-01-17 DIAGNOSIS — S8254XD Nondisplaced fracture of medial malleolus of right tibia, subsequent encounter for closed fracture with routine healing: Secondary | ICD-10-CM | POA: Diagnosis not present

## 2021-01-17 DIAGNOSIS — R293 Abnormal posture: Secondary | ICD-10-CM | POA: Diagnosis not present

## 2021-01-18 DIAGNOSIS — S8254XD Nondisplaced fracture of medial malleolus of right tibia, subsequent encounter for closed fracture with routine healing: Secondary | ICD-10-CM | POA: Diagnosis not present

## 2021-01-18 DIAGNOSIS — R293 Abnormal posture: Secondary | ICD-10-CM | POA: Diagnosis not present

## 2021-01-21 DIAGNOSIS — S8254XD Nondisplaced fracture of medial malleolus of right tibia, subsequent encounter for closed fracture with routine healing: Secondary | ICD-10-CM | POA: Diagnosis not present

## 2021-01-21 DIAGNOSIS — R293 Abnormal posture: Secondary | ICD-10-CM | POA: Diagnosis not present

## 2021-01-22 DIAGNOSIS — S8254XD Nondisplaced fracture of medial malleolus of right tibia, subsequent encounter for closed fracture with routine healing: Secondary | ICD-10-CM | POA: Diagnosis not present

## 2021-01-22 DIAGNOSIS — R293 Abnormal posture: Secondary | ICD-10-CM | POA: Diagnosis not present

## 2021-01-23 DIAGNOSIS — R293 Abnormal posture: Secondary | ICD-10-CM | POA: Diagnosis not present

## 2021-01-23 DIAGNOSIS — S8254XD Nondisplaced fracture of medial malleolus of right tibia, subsequent encounter for closed fracture with routine healing: Secondary | ICD-10-CM | POA: Diagnosis not present

## 2021-01-23 DIAGNOSIS — S82871A Displaced pilon fracture of right tibia, initial encounter for closed fracture: Secondary | ICD-10-CM | POA: Diagnosis not present

## 2021-01-24 DIAGNOSIS — R293 Abnormal posture: Secondary | ICD-10-CM | POA: Diagnosis not present

## 2021-01-24 DIAGNOSIS — S8254XD Nondisplaced fracture of medial malleolus of right tibia, subsequent encounter for closed fracture with routine healing: Secondary | ICD-10-CM | POA: Diagnosis not present

## 2021-01-25 DIAGNOSIS — S8254XD Nondisplaced fracture of medial malleolus of right tibia, subsequent encounter for closed fracture with routine healing: Secondary | ICD-10-CM | POA: Diagnosis not present

## 2021-01-25 DIAGNOSIS — R293 Abnormal posture: Secondary | ICD-10-CM | POA: Diagnosis not present

## 2021-01-25 DIAGNOSIS — S82301D Unspecified fracture of lower end of right tibia, subsequent encounter for closed fracture with routine healing: Secondary | ICD-10-CM | POA: Diagnosis not present

## 2021-01-25 DIAGNOSIS — S31109D Unspecified open wound of abdominal wall, unspecified quadrant without penetration into peritoneal cavity, subsequent encounter: Secondary | ICD-10-CM | POA: Diagnosis not present

## 2021-01-28 DIAGNOSIS — S8254XD Nondisplaced fracture of medial malleolus of right tibia, subsequent encounter for closed fracture with routine healing: Secondary | ICD-10-CM | POA: Diagnosis not present

## 2021-01-28 DIAGNOSIS — R293 Abnormal posture: Secondary | ICD-10-CM | POA: Diagnosis not present

## 2021-01-29 DIAGNOSIS — S8254XD Nondisplaced fracture of medial malleolus of right tibia, subsequent encounter for closed fracture with routine healing: Secondary | ICD-10-CM | POA: Diagnosis not present

## 2021-01-29 DIAGNOSIS — R293 Abnormal posture: Secondary | ICD-10-CM | POA: Diagnosis not present

## 2021-01-30 DIAGNOSIS — S8254XD Nondisplaced fracture of medial malleolus of right tibia, subsequent encounter for closed fracture with routine healing: Secondary | ICD-10-CM | POA: Diagnosis not present

## 2021-01-30 DIAGNOSIS — R293 Abnormal posture: Secondary | ICD-10-CM | POA: Diagnosis not present

## 2021-01-31 DIAGNOSIS — S8254XD Nondisplaced fracture of medial malleolus of right tibia, subsequent encounter for closed fracture with routine healing: Secondary | ICD-10-CM | POA: Diagnosis not present

## 2021-01-31 DIAGNOSIS — R293 Abnormal posture: Secondary | ICD-10-CM | POA: Diagnosis not present

## 2021-02-01 DIAGNOSIS — L603 Nail dystrophy: Secondary | ICD-10-CM | POA: Diagnosis not present

## 2021-02-01 DIAGNOSIS — I739 Peripheral vascular disease, unspecified: Secondary | ICD-10-CM | POA: Diagnosis not present

## 2021-02-01 DIAGNOSIS — S8254XD Nondisplaced fracture of medial malleolus of right tibia, subsequent encounter for closed fracture with routine healing: Secondary | ICD-10-CM | POA: Diagnosis not present

## 2021-02-01 DIAGNOSIS — R293 Abnormal posture: Secondary | ICD-10-CM | POA: Diagnosis not present

## 2021-02-04 DIAGNOSIS — S8254XD Nondisplaced fracture of medial malleolus of right tibia, subsequent encounter for closed fracture with routine healing: Secondary | ICD-10-CM | POA: Diagnosis not present

## 2021-02-04 DIAGNOSIS — R293 Abnormal posture: Secondary | ICD-10-CM | POA: Diagnosis not present

## 2021-02-05 DIAGNOSIS — S8254XD Nondisplaced fracture of medial malleolus of right tibia, subsequent encounter for closed fracture with routine healing: Secondary | ICD-10-CM | POA: Diagnosis not present

## 2021-02-05 DIAGNOSIS — R293 Abnormal posture: Secondary | ICD-10-CM | POA: Diagnosis not present

## 2021-02-06 DIAGNOSIS — R293 Abnormal posture: Secondary | ICD-10-CM | POA: Diagnosis not present

## 2021-02-06 DIAGNOSIS — S8254XD Nondisplaced fracture of medial malleolus of right tibia, subsequent encounter for closed fracture with routine healing: Secondary | ICD-10-CM | POA: Diagnosis not present

## 2021-02-13 DIAGNOSIS — M25571 Pain in right ankle and joints of right foot: Secondary | ICD-10-CM | POA: Diagnosis not present

## 2021-02-13 DIAGNOSIS — S82871D Displaced pilon fracture of right tibia, subsequent encounter for closed fracture with routine healing: Secondary | ICD-10-CM | POA: Diagnosis not present

## 2021-03-06 DIAGNOSIS — L989 Disorder of the skin and subcutaneous tissue, unspecified: Secondary | ICD-10-CM | POA: Diagnosis not present

## 2021-03-06 DIAGNOSIS — Z7901 Long term (current) use of anticoagulants: Secondary | ICD-10-CM | POA: Diagnosis not present

## 2021-03-06 DIAGNOSIS — R0989 Other specified symptoms and signs involving the circulatory and respiratory systems: Secondary | ICD-10-CM | POA: Diagnosis not present

## 2021-03-08 DIAGNOSIS — E08311 Diabetes mellitus due to underlying condition with unspecified diabetic retinopathy with macular edema: Secondary | ICD-10-CM | POA: Diagnosis not present

## 2021-03-11 DIAGNOSIS — I5032 Chronic diastolic (congestive) heart failure: Secondary | ICD-10-CM | POA: Diagnosis not present

## 2021-03-11 DIAGNOSIS — I69359 Hemiplegia and hemiparesis following cerebral infarction affecting unspecified side: Secondary | ICD-10-CM | POA: Diagnosis not present

## 2021-03-11 DIAGNOSIS — J9612 Chronic respiratory failure with hypercapnia: Secondary | ICD-10-CM | POA: Diagnosis not present

## 2021-03-11 DIAGNOSIS — R0989 Other specified symptoms and signs involving the circulatory and respiratory systems: Secondary | ICD-10-CM | POA: Diagnosis not present

## 2021-03-13 DIAGNOSIS — I10 Essential (primary) hypertension: Secondary | ICD-10-CM | POA: Diagnosis not present

## 2021-03-13 DIAGNOSIS — E119 Type 2 diabetes mellitus without complications: Secondary | ICD-10-CM | POA: Diagnosis not present

## 2021-03-13 DIAGNOSIS — E785 Hyperlipidemia, unspecified: Secondary | ICD-10-CM | POA: Diagnosis not present

## 2021-03-13 DIAGNOSIS — D649 Anemia, unspecified: Secondary | ICD-10-CM | POA: Diagnosis not present

## 2021-03-19 DIAGNOSIS — L0291 Cutaneous abscess, unspecified: Secondary | ICD-10-CM | POA: Diagnosis not present

## 2021-03-19 DIAGNOSIS — L02211 Cutaneous abscess of abdominal wall: Secondary | ICD-10-CM | POA: Diagnosis not present

## 2021-03-27 DIAGNOSIS — M25571 Pain in right ankle and joints of right foot: Secondary | ICD-10-CM | POA: Diagnosis not present

## 2021-03-27 DIAGNOSIS — S82871D Displaced pilon fracture of right tibia, subsequent encounter for closed fracture with routine healing: Secondary | ICD-10-CM | POA: Diagnosis not present

## 2021-04-10 DIAGNOSIS — R293 Abnormal posture: Secondary | ICD-10-CM | POA: Diagnosis not present

## 2021-04-10 DIAGNOSIS — M24551 Contracture, right hip: Secondary | ICD-10-CM | POA: Diagnosis not present

## 2021-04-10 DIAGNOSIS — I69351 Hemiplegia and hemiparesis following cerebral infarction affecting right dominant side: Secondary | ICD-10-CM | POA: Diagnosis not present

## 2021-04-11 DIAGNOSIS — I69351 Hemiplegia and hemiparesis following cerebral infarction affecting right dominant side: Secondary | ICD-10-CM | POA: Diagnosis not present

## 2021-04-11 DIAGNOSIS — R293 Abnormal posture: Secondary | ICD-10-CM | POA: Diagnosis not present

## 2021-04-11 DIAGNOSIS — M24551 Contracture, right hip: Secondary | ICD-10-CM | POA: Diagnosis not present

## 2021-04-15 DIAGNOSIS — I69351 Hemiplegia and hemiparesis following cerebral infarction affecting right dominant side: Secondary | ICD-10-CM | POA: Diagnosis not present

## 2021-04-15 DIAGNOSIS — Z79899 Other long term (current) drug therapy: Secondary | ICD-10-CM | POA: Diagnosis not present

## 2021-04-15 DIAGNOSIS — R293 Abnormal posture: Secondary | ICD-10-CM | POA: Diagnosis not present

## 2021-04-15 DIAGNOSIS — M24551 Contracture, right hip: Secondary | ICD-10-CM | POA: Diagnosis not present

## 2021-04-16 DIAGNOSIS — R293 Abnormal posture: Secondary | ICD-10-CM | POA: Diagnosis not present

## 2021-04-16 DIAGNOSIS — I69351 Hemiplegia and hemiparesis following cerebral infarction affecting right dominant side: Secondary | ICD-10-CM | POA: Diagnosis not present

## 2021-04-16 DIAGNOSIS — M24551 Contracture, right hip: Secondary | ICD-10-CM | POA: Diagnosis not present

## 2021-04-17 DIAGNOSIS — M24551 Contracture, right hip: Secondary | ICD-10-CM | POA: Diagnosis not present

## 2021-04-17 DIAGNOSIS — R770 Abnormality of albumin: Secondary | ICD-10-CM | POA: Diagnosis not present

## 2021-04-17 DIAGNOSIS — I69351 Hemiplegia and hemiparesis following cerebral infarction affecting right dominant side: Secondary | ICD-10-CM | POA: Diagnosis not present

## 2021-04-17 DIAGNOSIS — D6859 Other primary thrombophilia: Secondary | ICD-10-CM | POA: Diagnosis not present

## 2021-04-17 DIAGNOSIS — G40909 Epilepsy, unspecified, not intractable, without status epilepticus: Secondary | ICD-10-CM | POA: Diagnosis not present

## 2021-04-17 DIAGNOSIS — R293 Abnormal posture: Secondary | ICD-10-CM | POA: Diagnosis not present

## 2021-04-17 DIAGNOSIS — J9612 Chronic respiratory failure with hypercapnia: Secondary | ICD-10-CM | POA: Diagnosis not present

## 2021-04-18 DIAGNOSIS — M24551 Contracture, right hip: Secondary | ICD-10-CM | POA: Diagnosis not present

## 2021-04-18 DIAGNOSIS — I69351 Hemiplegia and hemiparesis following cerebral infarction affecting right dominant side: Secondary | ICD-10-CM | POA: Diagnosis not present

## 2021-04-18 DIAGNOSIS — R293 Abnormal posture: Secondary | ICD-10-CM | POA: Diagnosis not present

## 2021-04-22 DIAGNOSIS — R293 Abnormal posture: Secondary | ICD-10-CM | POA: Diagnosis not present

## 2021-04-22 DIAGNOSIS — I69351 Hemiplegia and hemiparesis following cerebral infarction affecting right dominant side: Secondary | ICD-10-CM | POA: Diagnosis not present

## 2021-04-22 DIAGNOSIS — Z23 Encounter for immunization: Secondary | ICD-10-CM | POA: Diagnosis not present

## 2021-04-22 DIAGNOSIS — M24551 Contracture, right hip: Secondary | ICD-10-CM | POA: Diagnosis not present

## 2021-04-23 DIAGNOSIS — R293 Abnormal posture: Secondary | ICD-10-CM | POA: Diagnosis not present

## 2021-04-23 DIAGNOSIS — M24551 Contracture, right hip: Secondary | ICD-10-CM | POA: Diagnosis not present

## 2021-04-23 DIAGNOSIS — I69351 Hemiplegia and hemiparesis following cerebral infarction affecting right dominant side: Secondary | ICD-10-CM | POA: Diagnosis not present

## 2021-04-24 DIAGNOSIS — R293 Abnormal posture: Secondary | ICD-10-CM | POA: Diagnosis not present

## 2021-04-24 DIAGNOSIS — I69351 Hemiplegia and hemiparesis following cerebral infarction affecting right dominant side: Secondary | ICD-10-CM | POA: Diagnosis not present

## 2021-04-24 DIAGNOSIS — M24551 Contracture, right hip: Secondary | ICD-10-CM | POA: Diagnosis not present

## 2021-04-25 DIAGNOSIS — M24551 Contracture, right hip: Secondary | ICD-10-CM | POA: Diagnosis not present

## 2021-04-25 DIAGNOSIS — I69351 Hemiplegia and hemiparesis following cerebral infarction affecting right dominant side: Secondary | ICD-10-CM | POA: Diagnosis not present

## 2021-04-25 DIAGNOSIS — R293 Abnormal posture: Secondary | ICD-10-CM | POA: Diagnosis not present

## 2021-04-26 DIAGNOSIS — I69351 Hemiplegia and hemiparesis following cerebral infarction affecting right dominant side: Secondary | ICD-10-CM | POA: Diagnosis not present

## 2021-04-26 DIAGNOSIS — R293 Abnormal posture: Secondary | ICD-10-CM | POA: Diagnosis not present

## 2021-04-26 DIAGNOSIS — M24551 Contracture, right hip: Secondary | ICD-10-CM | POA: Diagnosis not present

## 2021-05-06 DIAGNOSIS — I5032 Chronic diastolic (congestive) heart failure: Secondary | ICD-10-CM | POA: Diagnosis not present

## 2021-05-06 DIAGNOSIS — Z79899 Other long term (current) drug therapy: Secondary | ICD-10-CM | POA: Diagnosis not present

## 2021-05-06 DIAGNOSIS — I69351 Hemiplegia and hemiparesis following cerebral infarction affecting right dominant side: Secondary | ICD-10-CM | POA: Diagnosis not present

## 2021-05-06 DIAGNOSIS — E662 Morbid (severe) obesity with alveolar hypoventilation: Secondary | ICD-10-CM | POA: Diagnosis not present

## 2021-05-06 DIAGNOSIS — I4891 Unspecified atrial fibrillation: Secondary | ICD-10-CM | POA: Diagnosis not present

## 2021-05-06 DIAGNOSIS — F39 Unspecified mood [affective] disorder: Secondary | ICD-10-CM | POA: Diagnosis not present

## 2021-05-06 DIAGNOSIS — R609 Edema, unspecified: Secondary | ICD-10-CM | POA: Diagnosis not present

## 2021-05-06 DIAGNOSIS — G40909 Epilepsy, unspecified, not intractable, without status epilepticus: Secondary | ICD-10-CM | POA: Diagnosis not present

## 2021-05-06 DIAGNOSIS — J9612 Chronic respiratory failure with hypercapnia: Secondary | ICD-10-CM | POA: Diagnosis not present

## 2021-05-06 DIAGNOSIS — E785 Hyperlipidemia, unspecified: Secondary | ICD-10-CM | POA: Diagnosis not present

## 2021-05-06 DIAGNOSIS — D6859 Other primary thrombophilia: Secondary | ICD-10-CM | POA: Diagnosis not present

## 2021-05-27 DIAGNOSIS — D6859 Other primary thrombophilia: Secondary | ICD-10-CM | POA: Diagnosis not present

## 2021-05-27 DIAGNOSIS — F39 Unspecified mood [affective] disorder: Secondary | ICD-10-CM | POA: Diagnosis not present

## 2021-05-27 DIAGNOSIS — R030 Elevated blood-pressure reading, without diagnosis of hypertension: Secondary | ICD-10-CM | POA: Diagnosis not present

## 2021-05-27 DIAGNOSIS — R6889 Other general symptoms and signs: Secondary | ICD-10-CM | POA: Diagnosis not present

## 2021-06-06 ENCOUNTER — Non-Acute Institutional Stay: Payer: Medicare Other | Admitting: Hospice

## 2021-06-06 ENCOUNTER — Other Ambulatory Visit: Payer: Self-pay

## 2021-06-06 DIAGNOSIS — I5032 Chronic diastolic (congestive) heart failure: Secondary | ICD-10-CM | POA: Diagnosis not present

## 2021-06-06 DIAGNOSIS — F339 Major depressive disorder, recurrent, unspecified: Secondary | ICD-10-CM | POA: Diagnosis not present

## 2021-06-06 DIAGNOSIS — Z515 Encounter for palliative care: Secondary | ICD-10-CM

## 2021-06-06 DIAGNOSIS — R0602 Shortness of breath: Secondary | ICD-10-CM | POA: Diagnosis not present

## 2021-06-06 NOTE — Progress Notes (Signed)
Therapist, nutritional Palliative Care Consult Note Telephone: [phone]  Fax: [phone]  PATIENT NAME: James Villegas DOB: 10/22/1955 MRN: [id number]  PRIMARY CARE PROVIDER:   Karna Dupes, MD Karna Dupes, MD [address] [place],  [place] [address]  REFERRING PROVIDER: Karna Dupes, MD Karna Dupes, MD [address] [place],  [place] [address]   RESPONSIBLE PARTY: Self/Sister [name] [phone] Contact Information     Name Relation Home Work Mobile   Haben, Billingsley Sister [phone]  [phone]       Visit is to build trust and highlight Palliative Medicine as specialized medical care for people living with serious illness, aimed at facilitating better quality of life through symptoms relief, assisting with advance care planning and complex medical decision making. This is a follow up visit.  RECOMMENDATIONS/PLAN:    CODE STATUS: Patient is a Do Not Resuscitate   GOALS OF CARE: Goals of care include to maximize quality of life and symptom management.   MOST selections include comfort measures antibiotics if indicated, no IV fluids, no feeding tube.  Both signed DNR and MOST form uploaded to [facility] health electronic medical record epic.  Visit consisted of counseling and education dealing with the complex and emotionally intense issues of symptom management and palliative care in the setting of serious and potentially life-threatening illness. Palliative care team will continue to support patient, patient's family, and medical team.  Symptom management/Plan:  Shortness of breath: Continue with continuous oxygen supplementation  3L/Min. Manage fluid intake to prevent fluid overload.  CHF:  Education provided on Fluid restriction 1588ml/24 hours, no added salt, Elevate BLE. Continue Torsemide as ordered.  Depression: Managed with Venlafaxin. Psych consult as needed.  Follow up: Palliative care will continue  to follow for complex medical decision making, advance care planning, and clarification of goals. Return 6 weeks or prn. Encouraged to call provider sooner with any concerns.  CHIEF COMPLAINT: Palliative follow up  HISTORY OF PRESENT ILLNESS:  James Villegas a 65 y.o. male with multiple medical problems including shortness of breath related to chronic diastolic CHF, currently managed with Torsemide and oxygen supplementation. History of Afib on coumadin, R hemiplegia hemiparesis following CVA, Depression.  Patient denies difficulty breathing, pain or discomfort; denies depressed mood. Nursing with no concerns other than patient continues to drinks can of soda every day and he says that is his comfort measure. Encouraged nursing to continue to provide education on need for staying within his fluid limits to help prevent fluid overload. Nursing to put patient on dietary list for consult. History obtained from review of EMR, discussion with primary team, family and/or patient. Records reviewed and summarized above. All 10 point systems reviewed and are negative except as documented in history of present illness above  Review and summarization of Epic records shows history from other than patient.   Palliative Care was asked to follow this patient o help address complex decision making in the context of advance care planning and goals of care clarification.   PHYSICAL EXAM  General: In no acute distress, appropriately dressed, morbidly obese Cardiovascular: regular rate and rhythm; non pitting edema to right lower ankle Pulmonary: no cough, no increased work of breathing, normal respiratory effort on 3 L of oxygen per minute Abdomen: soft, non tender, no guarding, positive bowel sounds in all quadrants GU:  no suprapubic tenderness Eyes: Normal lids, no discharge ENMT: Moist mucous membranes Musculoskeletal:  weakness,  gets around with power scooter, non ambulatory Skin: no rash to visible skin, warm  without cyanosis,  Psych: non-anxious affect Neurological: Weakness but otherwise non focal Heme/lymph/immuno: no bruises, no bleeding  PERTINENT MEDICATIONS:  Outpatient Encounter Medications as of 06/06/2021  Medication Sig   acetaminophen (TYLENOL) 325 MG tablet Take 650 mg by mouth every 6 (six) hours as needed for mild pain or headache.    Carboxymeth-Glycerin-Polysorb (REFRESH OPTIVE ADVANCED OP) Place 1 drop into both eyes 4 (four) times daily. Wait 3-5 minutes between eye meds   cycloSPORINE (RESTASIS) 0.05 % ophthalmic emulsion Place 1 drop into both eyes 2 (two) times daily.   ezetimibe (ZETIA) 10 MG tablet Take 10 mg by mouth daily.   fish oil-omega-3 fatty acids 1000 MG capsule Take 1 g by mouth 2 (two) times daily with a meal.   metoprolol tartrate (LOPRESSOR) 25 MG tablet Take 0.5 tablets (12.5 mg total) by mouth 2 (two) times daily.   omeprazole (PRILOSEC) 40 MG capsule Take 1 capsule (40 mg total) by mouth daily.   OXYGEN Inhale into the lungs See admin instructions. Nightly 9p-8a   phenytoin (DILANTIN) 100 MG ER capsule Take 200 mg by mouth 2 (two) times daily with a meal.   potassium chloride SA (KLOR-CON) 20 MEQ tablet Take 2 tablets (40 mEq total) by mouth daily.   rosuvastatin (CRESTOR) 40 MG tablet Take 40 mg by mouth at bedtime.   torsemide (DEMADEX) 20 MG tablet Take 1 tablet (20 mg total) by mouth 2 (two) times daily.   venlafaxine (EFFEXOR) 75 MG tablet Take 75 mg by mouth daily.   warfarin (COUMADIN) 5 MG tablet Take 1 tablet (5 mg total) by mouth daily for 10 days.   No facility-administered encounter medications on file as of 06/06/2021.    HOSPICE ELIGIBILITY/DIAGNOSIS: TBD  PAST MEDICAL HISTORY:  Past Medical History:  Diagnosis Date   Acute on chronic congestive heart failure (HCC)    Anemia, secondary    SECONDARY TO ACUTE BLOOD LOSS   Anxiety disorder    Bloody stool 08/29/2011   intermittent along with constipation.    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 IVC filter   Seizure disorder (HCC)      Review lab tests/diagnostics No results for input(s): WBC, HGB, HCT, PLT, MCV in the last 168 hours. No results for input(s): NA, K, CL, CO2, BUN, CREATININE, GLUCOSE in the last 168 hours. CrCl cannot be calculated (Patient's most recent lab result is older than the maximum 21 days allowed.).  ALLERGIES:  Allergies  Allergen Reactions   Fenofibrate Other (See Comments)    Per MAR      I spent 50 minutes providing this consultation; this includes time spent with patient/family, chart  review and documentation. More than 50% of the time in this consultation was spent on counseling and coordinating communication   Thank you for the opportunity to participate in the care of MAURION WALKOWIAK Please call our office at [phone] if we can be of additional assistance.  Note: Portions of this note were generated with Scientist, clinical (histocompatibility and immunogenetics). Dictation errors may occur despite best attempts at proofreading.  Rosaura Carpenter, NP

## 2021-06-07 DIAGNOSIS — L989 Disorder of the skin and subcutaneous tissue, unspecified: Secondary | ICD-10-CM | POA: Diagnosis not present

## 2021-07-09 DIAGNOSIS — M24541 Contracture, right hand: Secondary | ICD-10-CM | POA: Diagnosis not present

## 2021-07-09 DIAGNOSIS — I69351 Hemiplegia and hemiparesis following cerebral infarction affecting right dominant side: Secondary | ICD-10-CM | POA: Diagnosis not present

## 2021-07-09 DIAGNOSIS — R293 Abnormal posture: Secondary | ICD-10-CM | POA: Diagnosis not present

## 2021-07-09 DIAGNOSIS — F331 Major depressive disorder, recurrent, moderate: Secondary | ICD-10-CM | POA: Diagnosis not present

## 2021-07-09 DIAGNOSIS — M24551 Contracture, right hip: Secondary | ICD-10-CM | POA: Diagnosis not present

## 2021-07-10 DIAGNOSIS — M24541 Contracture, right hand: Secondary | ICD-10-CM | POA: Diagnosis not present

## 2021-07-10 DIAGNOSIS — I69351 Hemiplegia and hemiparesis following cerebral infarction affecting right dominant side: Secondary | ICD-10-CM | POA: Diagnosis not present

## 2021-07-10 DIAGNOSIS — R293 Abnormal posture: Secondary | ICD-10-CM | POA: Diagnosis not present

## 2021-07-10 DIAGNOSIS — M24551 Contracture, right hip: Secondary | ICD-10-CM | POA: Diagnosis not present

## 2021-07-11 DIAGNOSIS — I69351 Hemiplegia and hemiparesis following cerebral infarction affecting right dominant side: Secondary | ICD-10-CM | POA: Diagnosis not present

## 2021-07-11 DIAGNOSIS — M24551 Contracture, right hip: Secondary | ICD-10-CM | POA: Diagnosis not present

## 2021-07-11 DIAGNOSIS — R293 Abnormal posture: Secondary | ICD-10-CM | POA: Diagnosis not present

## 2021-07-11 DIAGNOSIS — M24541 Contracture, right hand: Secondary | ICD-10-CM | POA: Diagnosis not present

## 2021-07-12 DIAGNOSIS — I69351 Hemiplegia and hemiparesis following cerebral infarction affecting right dominant side: Secondary | ICD-10-CM | POA: Diagnosis not present

## 2021-07-12 DIAGNOSIS — R293 Abnormal posture: Secondary | ICD-10-CM | POA: Diagnosis not present

## 2021-07-12 DIAGNOSIS — M24551 Contracture, right hip: Secondary | ICD-10-CM | POA: Diagnosis not present

## 2021-07-12 DIAGNOSIS — M24541 Contracture, right hand: Secondary | ICD-10-CM | POA: Diagnosis not present

## 2021-07-15 DIAGNOSIS — M24551 Contracture, right hip: Secondary | ICD-10-CM | POA: Diagnosis not present

## 2021-07-15 DIAGNOSIS — I69351 Hemiplegia and hemiparesis following cerebral infarction affecting right dominant side: Secondary | ICD-10-CM | POA: Diagnosis not present

## 2021-07-15 DIAGNOSIS — M24541 Contracture, right hand: Secondary | ICD-10-CM | POA: Diagnosis not present

## 2021-07-15 DIAGNOSIS — R293 Abnormal posture: Secondary | ICD-10-CM | POA: Diagnosis not present

## 2021-07-16 DIAGNOSIS — R293 Abnormal posture: Secondary | ICD-10-CM | POA: Diagnosis not present

## 2021-07-16 DIAGNOSIS — M24551 Contracture, right hip: Secondary | ICD-10-CM | POA: Diagnosis not present

## 2021-07-16 DIAGNOSIS — M24541 Contracture, right hand: Secondary | ICD-10-CM | POA: Diagnosis not present

## 2021-07-16 DIAGNOSIS — I69351 Hemiplegia and hemiparesis following cerebral infarction affecting right dominant side: Secondary | ICD-10-CM | POA: Diagnosis not present

## 2021-07-17 DIAGNOSIS — M24541 Contracture, right hand: Secondary | ICD-10-CM | POA: Diagnosis not present

## 2021-07-17 DIAGNOSIS — R293 Abnormal posture: Secondary | ICD-10-CM | POA: Diagnosis not present

## 2021-07-17 DIAGNOSIS — M24551 Contracture, right hip: Secondary | ICD-10-CM | POA: Diagnosis not present

## 2021-07-17 DIAGNOSIS — I69351 Hemiplegia and hemiparesis following cerebral infarction affecting right dominant side: Secondary | ICD-10-CM | POA: Diagnosis not present

## 2021-07-18 DIAGNOSIS — M24551 Contracture, right hip: Secondary | ICD-10-CM | POA: Diagnosis not present

## 2021-07-18 DIAGNOSIS — R293 Abnormal posture: Secondary | ICD-10-CM | POA: Diagnosis not present

## 2021-07-18 DIAGNOSIS — M24541 Contracture, right hand: Secondary | ICD-10-CM | POA: Diagnosis not present

## 2021-07-18 DIAGNOSIS — I69351 Hemiplegia and hemiparesis following cerebral infarction affecting right dominant side: Secondary | ICD-10-CM | POA: Diagnosis not present

## 2021-07-19 DIAGNOSIS — M24541 Contracture, right hand: Secondary | ICD-10-CM | POA: Diagnosis not present

## 2021-07-19 DIAGNOSIS — I69351 Hemiplegia and hemiparesis following cerebral infarction affecting right dominant side: Secondary | ICD-10-CM | POA: Diagnosis not present

## 2021-07-19 DIAGNOSIS — M24551 Contracture, right hip: Secondary | ICD-10-CM | POA: Diagnosis not present

## 2021-07-19 DIAGNOSIS — R293 Abnormal posture: Secondary | ICD-10-CM | POA: Diagnosis not present

## 2021-07-22 DIAGNOSIS — M24551 Contracture, right hip: Secondary | ICD-10-CM | POA: Diagnosis not present

## 2021-07-22 DIAGNOSIS — I69351 Hemiplegia and hemiparesis following cerebral infarction affecting right dominant side: Secondary | ICD-10-CM | POA: Diagnosis not present

## 2021-07-22 DIAGNOSIS — R293 Abnormal posture: Secondary | ICD-10-CM | POA: Diagnosis not present

## 2021-07-22 DIAGNOSIS — M24541 Contracture, right hand: Secondary | ICD-10-CM | POA: Diagnosis not present

## 2021-07-23 DIAGNOSIS — I69351 Hemiplegia and hemiparesis following cerebral infarction affecting right dominant side: Secondary | ICD-10-CM | POA: Diagnosis not present

## 2021-07-23 DIAGNOSIS — M24541 Contracture, right hand: Secondary | ICD-10-CM | POA: Diagnosis not present

## 2021-07-23 DIAGNOSIS — R293 Abnormal posture: Secondary | ICD-10-CM | POA: Diagnosis not present

## 2021-07-23 DIAGNOSIS — M24551 Contracture, right hip: Secondary | ICD-10-CM | POA: Diagnosis not present

## 2021-07-24 DIAGNOSIS — M24541 Contracture, right hand: Secondary | ICD-10-CM | POA: Diagnosis not present

## 2021-07-24 DIAGNOSIS — R293 Abnormal posture: Secondary | ICD-10-CM | POA: Diagnosis not present

## 2021-07-24 DIAGNOSIS — I69351 Hemiplegia and hemiparesis following cerebral infarction affecting right dominant side: Secondary | ICD-10-CM | POA: Diagnosis not present

## 2021-07-24 DIAGNOSIS — M24551 Contracture, right hip: Secondary | ICD-10-CM | POA: Diagnosis not present

## 2021-07-25 DIAGNOSIS — I739 Peripheral vascular disease, unspecified: Secondary | ICD-10-CM | POA: Diagnosis not present

## 2021-07-25 DIAGNOSIS — L602 Onychogryphosis: Secondary | ICD-10-CM | POA: Diagnosis not present

## 2021-07-25 DIAGNOSIS — L603 Nail dystrophy: Secondary | ICD-10-CM | POA: Diagnosis not present

## 2021-07-29 DIAGNOSIS — D6859 Other primary thrombophilia: Secondary | ICD-10-CM | POA: Diagnosis not present

## 2021-07-29 DIAGNOSIS — I4891 Unspecified atrial fibrillation: Secondary | ICD-10-CM | POA: Diagnosis not present

## 2021-07-30 DIAGNOSIS — F331 Major depressive disorder, recurrent, moderate: Secondary | ICD-10-CM | POA: Diagnosis not present

## 2021-08-07 DIAGNOSIS — Z20822 Contact with and (suspected) exposure to covid-19: Secondary | ICD-10-CM | POA: Diagnosis not present

## 2021-08-13 DIAGNOSIS — F331 Major depressive disorder, recurrent, moderate: Secondary | ICD-10-CM | POA: Diagnosis not present

## 2021-08-27 DIAGNOSIS — F331 Major depressive disorder, recurrent, moderate: Secondary | ICD-10-CM | POA: Diagnosis not present

## 2021-08-28 DIAGNOSIS — M79641 Pain in right hand: Secondary | ICD-10-CM | POA: Diagnosis not present

## 2021-08-28 DIAGNOSIS — S60221A Contusion of right hand, initial encounter: Secondary | ICD-10-CM | POA: Diagnosis not present

## 2021-09-02 DIAGNOSIS — L723 Sebaceous cyst: Secondary | ICD-10-CM | POA: Diagnosis not present

## 2021-09-02 DIAGNOSIS — R609 Edema, unspecified: Secondary | ICD-10-CM | POA: Diagnosis not present

## 2021-09-02 DIAGNOSIS — I69351 Hemiplegia and hemiparesis following cerebral infarction affecting right dominant side: Secondary | ICD-10-CM | POA: Diagnosis not present

## 2021-09-02 DIAGNOSIS — I4891 Unspecified atrial fibrillation: Secondary | ICD-10-CM | POA: Diagnosis not present

## 2021-09-02 DIAGNOSIS — J9612 Chronic respiratory failure with hypercapnia: Secondary | ICD-10-CM | POA: Diagnosis not present

## 2021-09-02 DIAGNOSIS — E662 Morbid (severe) obesity with alveolar hypoventilation: Secondary | ICD-10-CM | POA: Diagnosis not present

## 2021-09-02 DIAGNOSIS — D6859 Other primary thrombophilia: Secondary | ICD-10-CM | POA: Diagnosis not present

## 2021-09-02 DIAGNOSIS — G40909 Epilepsy, unspecified, not intractable, without status epilepticus: Secondary | ICD-10-CM | POA: Diagnosis not present

## 2021-09-02 DIAGNOSIS — E785 Hyperlipidemia, unspecified: Secondary | ICD-10-CM | POA: Diagnosis not present

## 2021-09-02 DIAGNOSIS — I5032 Chronic diastolic (congestive) heart failure: Secondary | ICD-10-CM | POA: Diagnosis not present

## 2021-09-02 DIAGNOSIS — K5901 Slow transit constipation: Secondary | ICD-10-CM | POA: Diagnosis not present

## 2021-09-02 DIAGNOSIS — F39 Unspecified mood [affective] disorder: Secondary | ICD-10-CM | POA: Diagnosis not present

## 2021-09-16 ENCOUNTER — Inpatient Hospital Stay (HOSPITAL_COMMUNITY): Payer: Medicare Other

## 2021-09-16 ENCOUNTER — Inpatient Hospital Stay (HOSPITAL_COMMUNITY)
Admission: EM | Admit: 2021-09-16 | Discharge: 2021-09-21 | DRG: 291 | Disposition: A | Discharge status: Other Acute Inpatient Hospital | Payer: Medicare Other | Source: Skilled Nursing Facility | Attending: Family Medicine | Admitting: Family Medicine

## 2021-09-16 ENCOUNTER — Emergency Department (HOSPITAL_COMMUNITY): Payer: Medicare Other

## 2021-09-16 ENCOUNTER — Other Ambulatory Visit: Payer: Self-pay

## 2021-09-16 DIAGNOSIS — K219 Gastro-esophageal reflux disease without esophagitis: Secondary | ICD-10-CM | POA: Diagnosis present

## 2021-09-16 DIAGNOSIS — J9621 Acute and chronic respiratory failure with hypoxia: Secondary | ICD-10-CM | POA: Diagnosis present

## 2021-09-16 DIAGNOSIS — F32A Depression, unspecified: Secondary | ICD-10-CM | POA: Diagnosis present

## 2021-09-16 DIAGNOSIS — Z86718 Personal history of other venous thrombosis and embolism: Secondary | ICD-10-CM | POA: Diagnosis not present

## 2021-09-16 DIAGNOSIS — Z8679 Personal history of other diseases of the circulatory system: Secondary | ICD-10-CM

## 2021-09-16 DIAGNOSIS — I509 Heart failure, unspecified: Secondary | ICD-10-CM | POA: Diagnosis not present

## 2021-09-16 DIAGNOSIS — I77810 Thoracic aortic ectasia: Secondary | ICD-10-CM | POA: Diagnosis present

## 2021-09-16 DIAGNOSIS — J9622 Acute and chronic respiratory failure with hypercapnia: Secondary | ICD-10-CM | POA: Diagnosis not present

## 2021-09-16 DIAGNOSIS — J80 Acute respiratory distress syndrome: Secondary | ICD-10-CM | POA: Diagnosis not present

## 2021-09-16 DIAGNOSIS — R069 Unspecified abnormalities of breathing: Secondary | ICD-10-CM | POA: Diagnosis not present

## 2021-09-16 DIAGNOSIS — R0902 Hypoxemia: Secondary | ICD-10-CM | POA: Diagnosis not present

## 2021-09-16 DIAGNOSIS — D6862 Lupus anticoagulant syndrome: Secondary | ICD-10-CM | POA: Diagnosis present

## 2021-09-16 DIAGNOSIS — Z87891 Personal history of nicotine dependence: Secondary | ICD-10-CM

## 2021-09-16 DIAGNOSIS — Q2112 Patent foramen ovale: Secondary | ICD-10-CM

## 2021-09-16 DIAGNOSIS — F419 Anxiety disorder, unspecified: Secondary | ICD-10-CM | POA: Diagnosis present

## 2021-09-16 DIAGNOSIS — I517 Cardiomegaly: Secondary | ICD-10-CM | POA: Diagnosis not present

## 2021-09-16 DIAGNOSIS — Z7901 Long term (current) use of anticoagulants: Secondary | ICD-10-CM

## 2021-09-16 DIAGNOSIS — I110 Hypertensive heart disease with heart failure: Principal | ICD-10-CM | POA: Diagnosis present

## 2021-09-16 DIAGNOSIS — Z79899 Other long term (current) drug therapy: Secondary | ICD-10-CM

## 2021-09-16 DIAGNOSIS — Z6841 Body Mass Index (BMI) 40.0 and over, adult: Secondary | ICD-10-CM

## 2021-09-16 DIAGNOSIS — D6859 Other primary thrombophilia: Secondary | ICD-10-CM | POA: Diagnosis not present

## 2021-09-16 DIAGNOSIS — R0602 Shortness of breath: Secondary | ICD-10-CM | POA: Diagnosis not present

## 2021-09-16 DIAGNOSIS — E876 Hypokalemia: Secondary | ICD-10-CM | POA: Diagnosis present

## 2021-09-16 DIAGNOSIS — G40909 Epilepsy, unspecified, not intractable, without status epilepticus: Secondary | ICD-10-CM

## 2021-09-16 DIAGNOSIS — L89321 Pressure ulcer of left buttock, stage 1: Secondary | ICD-10-CM | POA: Diagnosis not present

## 2021-09-16 DIAGNOSIS — I4510 Unspecified right bundle-branch block: Secondary | ICD-10-CM | POA: Diagnosis present

## 2021-09-16 DIAGNOSIS — I6930 Unspecified sequelae of cerebral infarction: Secondary | ICD-10-CM | POA: Diagnosis not present

## 2021-09-16 DIAGNOSIS — E785 Hyperlipidemia, unspecified: Secondary | ICD-10-CM | POA: Diagnosis not present

## 2021-09-16 DIAGNOSIS — R531 Weakness: Secondary | ICD-10-CM | POA: Diagnosis not present

## 2021-09-16 DIAGNOSIS — I69351 Hemiplegia and hemiparesis following cerebral infarction affecting right dominant side: Secondary | ICD-10-CM

## 2021-09-16 DIAGNOSIS — E66813 Obesity, class 3: Secondary | ICD-10-CM | POA: Diagnosis present

## 2021-09-16 DIAGNOSIS — I482 Chronic atrial fibrillation, unspecified: Secondary | ICD-10-CM | POA: Diagnosis not present

## 2021-09-16 DIAGNOSIS — R42 Dizziness and giddiness: Secondary | ICD-10-CM | POA: Diagnosis not present

## 2021-09-16 DIAGNOSIS — R778 Other specified abnormalities of plasma proteins: Secondary | ICD-10-CM | POA: Diagnosis not present

## 2021-09-16 DIAGNOSIS — Z888 Allergy status to other drugs, medicaments and biological substances status: Secondary | ICD-10-CM

## 2021-09-16 DIAGNOSIS — E871 Hypo-osmolality and hyponatremia: Secondary | ICD-10-CM | POA: Diagnosis present

## 2021-09-16 DIAGNOSIS — I5033 Acute on chronic diastolic (congestive) heart failure: Secondary | ICD-10-CM | POA: Diagnosis not present

## 2021-09-16 DIAGNOSIS — Z66 Do not resuscitate: Secondary | ICD-10-CM | POA: Diagnosis present

## 2021-09-16 DIAGNOSIS — L89311 Pressure ulcer of right buttock, stage 1: Secondary | ICD-10-CM | POA: Diagnosis not present

## 2021-09-16 DIAGNOSIS — Z7401 Bed confinement status: Secondary | ICD-10-CM | POA: Diagnosis not present

## 2021-09-16 DIAGNOSIS — R609 Edema, unspecified: Secondary | ICD-10-CM | POA: Diagnosis not present

## 2021-09-16 DIAGNOSIS — R0603 Acute respiratory distress: Secondary | ICD-10-CM | POA: Diagnosis not present

## 2021-09-16 DIAGNOSIS — Z9981 Dependence on supplemental oxygen: Secondary | ICD-10-CM

## 2021-09-16 DIAGNOSIS — I499 Cardiac arrhythmia, unspecified: Secondary | ICD-10-CM | POA: Diagnosis not present

## 2021-09-16 LAB — ECHOCARDIOGRAM COMPLETE
AR max vel: 3.64 cm2
AV Peak grad: 9.5 mmHg
Ao pk vel: 1.54 m/s
Height: 67 in
S' Lateral: 3.6 cm
Weight: 4684.33 oz

## 2021-09-16 LAB — I-STAT ARTERIAL BLOOD GAS, ED
Acid-Base Excess: 15 mmol/L — ABNORMAL HIGH (ref 0.0–2.0)
Bicarbonate: 47.3 mmol/L — ABNORMAL HIGH (ref 20.0–28.0)
Calcium, Ion: 1.21 mmol/L (ref 1.15–1.40)
HCT: 38 % — ABNORMAL LOW (ref 39.0–52.0)
Hemoglobin: 12.9 g/dL — ABNORMAL LOW (ref 13.0–17.0)
O2 Saturation: 93 %
Patient temperature: 98
Potassium: 4.4 mmol/L (ref 3.5–5.1)
Sodium: 131 mmol/L — ABNORMAL LOW (ref 135–145)
TCO2: >50 mmol/L — ABNORMAL HIGH (ref 22–32)
pCO2 arterial: 107.9 mmHg (ref 32–48)
pH, Arterial: 7.248 — ABNORMAL LOW (ref 7.35–7.45)
pO2, Arterial: 82 mmHg — ABNORMAL LOW (ref 83–108)

## 2021-09-16 LAB — CBC WITH DIFFERENTIAL/PLATELET
Abs Immature Granulocytes: 0.15 10*3/uL — ABNORMAL HIGH (ref 0.00–0.07)
Basophils Absolute: 0 10*3/uL (ref 0.0–0.1)
Basophils Relative: 0 %
Eosinophils Absolute: 0 10*3/uL (ref 0.0–0.5)
Eosinophils Relative: 0 %
HCT: 40.2 % (ref 39.0–52.0)
Hemoglobin: 12.9 g/dL — ABNORMAL LOW (ref 13.0–17.0)
Immature Granulocytes: 2 %
Lymphocytes Relative: 5 %
Lymphs Abs: 0.5 10*3/uL — ABNORMAL LOW (ref 0.7–4.0)
MCH: 31.2 pg (ref 26.0–34.0)
MCHC: 32.1 g/dL (ref 30.0–36.0)
MCV: 97.3 fL (ref 80.0–100.0)
Monocytes Absolute: 0.8 10*3/uL (ref 0.1–1.0)
Monocytes Relative: 8 %
Neutro Abs: 8.5 10*3/uL — ABNORMAL HIGH (ref 1.7–7.7)
Neutrophils Relative %: 85 %
Platelets: 260 10*3/uL (ref 150–400)
RBC: 4.13 MIL/uL — ABNORMAL LOW (ref 4.22–5.81)
RDW: 13.7 % (ref 11.5–15.5)
WBC: 9.9 10*3/uL (ref 4.0–10.5)
nRBC: 0 % (ref 0.0–0.2)

## 2021-09-16 LAB — PROTIME-INR
INR: 3.4 — ABNORMAL HIGH (ref 0.8–1.2)
Prothrombin Time: 34.1 seconds — ABNORMAL HIGH (ref 11.4–15.2)

## 2021-09-16 LAB — COMPREHENSIVE METABOLIC PANEL
ALT: 15 U/L (ref 0–44)
AST: 20 U/L (ref 15–41)
Albumin: 3.2 g/dL — ABNORMAL LOW (ref 3.5–5.0)
Alkaline Phosphatase: 108 U/L (ref 38–126)
Anion gap: 9 (ref 5–15)
BUN: 8 mg/dL (ref 8–23)
CO2: 38 mmol/L — ABNORMAL HIGH (ref 22–32)
Calcium: 8.6 mg/dL — ABNORMAL LOW (ref 8.9–10.3)
Chloride: 85 mmol/L — ABNORMAL LOW (ref 98–111)
Creatinine, Ser: 0.66 mg/dL (ref 0.61–1.24)
GFR, Estimated: >60 mL/min (ref >60)
Glucose, Bld: 152 mg/dL — ABNORMAL HIGH (ref 70–99)
Potassium: 4.3 mmol/L (ref 3.5–5.1)
Sodium: 132 mmol/L — ABNORMAL LOW (ref 135–145)
Total Bilirubin: 1.1 mg/dL (ref 0.3–1.2)
Total Protein: 6.7 g/dL (ref 6.5–8.1)

## 2021-09-16 LAB — BLOOD GAS, ARTERIAL
Acid-Base Excess: 12.7 mmol/L — ABNORMAL HIGH (ref 0.0–2.0)
Bicarbonate: 45 mmol/L — ABNORMAL HIGH (ref 20.0–28.0)
Drawn by: 34560
O2 Saturation: 99.9 %
Patient temperature: 36.7
pCO2 arterial: 109 mmHg (ref 32–48)
pH, Arterial: 7.22 — ABNORMAL LOW (ref 7.35–7.45)
pO2, Arterial: 146 mmHg — ABNORMAL HIGH (ref 83–108)

## 2021-09-16 LAB — TROPONIN I (HIGH SENSITIVITY)
Troponin I (High Sensitivity): 35 ng/L — ABNORMAL HIGH (ref <18)
Troponin I (High Sensitivity): 33 ng/L — ABNORMAL HIGH (ref <18)

## 2021-09-16 LAB — PROCALCITONIN: Procalcitonin: 0.13 ng/mL

## 2021-09-16 LAB — HIV ANTIBODY (ROUTINE TESTING W REFLEX): HIV Screen 4th Generation wRfx: NONREACTIVE

## 2021-09-16 LAB — BRAIN NATRIURETIC PEPTIDE: B Natriuretic Peptide: 270.4 pg/mL — ABNORMAL HIGH (ref 0.0–100.0)

## 2021-09-16 MED ORDER — POTASSIUM CHLORIDE CRYS ER 20 MEQ PO TBCR
40.0000 meq | EXTENDED_RELEASE_TABLET | Freq: Every day | ORAL | Status: DC
  Administered 2021-09-17 – 2021-09-18 (×2): 40 meq via ORAL
  Filled 2021-09-16 (×2): qty 2

## 2021-09-16 MED ORDER — VENLAFAXINE HCL ER 37.5 MG PO CP24
37.5000 mg | ORAL_CAPSULE | Freq: Every day | ORAL | Status: DC
  Administered 2021-09-17 – 2021-09-20 (×4): 37.5 mg via ORAL
  Filled 2021-09-16 (×6): qty 1

## 2021-09-16 MED ORDER — FUROSEMIDE 10 MG/ML IJ SOLN
40.0000 mg | Freq: Once | INTRAMUSCULAR | Status: AC
  Administered 2021-09-16: 40 mg via INTRAVENOUS
  Filled 2021-09-16: qty 4

## 2021-09-16 MED ORDER — WARFARIN SODIUM 2.5 MG PO TABS: 2.5000 mg | ORAL_TABLET | Freq: Once | ORAL | Status: DC

## 2021-09-16 MED ORDER — FUROSEMIDE 10 MG/ML IJ SOLN
40.0000 mg | Freq: Two times a day (BID) | INTRAMUSCULAR | Status: DC
  Administered 2021-09-16 – 2021-09-17 (×2): 40 mg via INTRAVENOUS
  Filled 2021-09-16 (×2): qty 4

## 2021-09-16 MED ORDER — ALBUTEROL SULFATE (2.5 MG/3ML) 0.083% IN NEBU: 2.5000 mg | INHALATION_SOLUTION | RESPIRATORY_TRACT | Status: DC | PRN

## 2021-09-16 MED ORDER — METOPROLOL TARTRATE 12.5 MG HALF TABLET
12.5000 mg | ORAL_TABLET | Freq: Two times a day (BID) | ORAL | Status: DC
  Administered 2021-09-17 – 2021-09-21 (×9): 12.5 mg via ORAL
  Filled 2021-09-16 (×9): qty 1

## 2021-09-16 MED ORDER — CYCLOSPORINE 0.05 % OP EMUL
1.0000 [drp] | Freq: Two times a day (BID) | OPHTHALMIC | Status: DC
  Administered 2021-09-16 – 2021-09-21 (×11): 1 [drp] via OPHTHALMIC
  Filled 2021-09-16 (×13): qty 30

## 2021-09-16 MED ORDER — POLYVINYL ALCOHOL 1.4 % OP SOLN
Freq: Four times a day (QID) | OPHTHALMIC | Status: DC
  Administered 2021-09-17 – 2021-09-18 (×2): 2 [drp] via OPHTHALMIC
  Administered 2021-09-19 – 2021-09-20 (×2): 1 [drp] via OPHTHALMIC
  Filled 2021-09-16: qty 15

## 2021-09-16 MED ORDER — WARFARIN - PHARMACIST DOSING INPATIENT: Freq: Every day | Status: DC

## 2021-09-16 MED ORDER — TRAZODONE HCL 50 MG PO TABS
50.0000 mg | ORAL_TABLET | Freq: Every day | ORAL | Status: DC
Start: 2021-09-16 — End: 2021-09-21
  Administered 2021-09-17 – 2021-09-20 (×4): 50 mg via ORAL
  Filled 2021-09-16 (×4): qty 1

## 2021-09-16 MED ORDER — ACETAMINOPHEN 325 MG PO TABS: 650.0000 mg | ORAL_TABLET | ORAL | Status: DC | PRN

## 2021-09-16 MED ORDER — SODIUM CHLORIDE 0.9 % IV SOLN
200.0000 mg | Freq: Two times a day (BID) | INTRAVENOUS | Status: DC
  Administered 2021-09-16 – 2021-09-17 (×4): 200 mg via INTRAVENOUS
  Filled 2021-09-16 (×8): qty 4

## 2021-09-16 MED ORDER — SODIUM CHLORIDE 0.9 % IV SOLN: 250.0000 mL | INTRAVENOUS | Status: DC | PRN

## 2021-09-16 MED ORDER — PANTOPRAZOLE SODIUM 40 MG PO TBEC
40.0000 mg | DELAYED_RELEASE_TABLET | Freq: Every day | ORAL | Status: DC
  Administered 2021-09-17 – 2021-09-21 (×5): 40 mg via ORAL
  Filled 2021-09-16 (×5): qty 1

## 2021-09-16 MED ORDER — LORAZEPAM 2 MG/ML IJ SOLN
0.5000 mg | Freq: Once | INTRAMUSCULAR | Status: AC
  Administered 2021-09-16: 0.5 mg via INTRAVENOUS
  Filled 2021-09-16: qty 1

## 2021-09-16 MED ORDER — SODIUM CHLORIDE 0.9% FLUSH: 3.0000 mL | INTRAVENOUS | Status: DC | PRN

## 2021-09-16 MED ORDER — ROSUVASTATIN CALCIUM 20 MG PO TABS
40.0000 mg | ORAL_TABLET | Freq: Every day | ORAL | Status: DC
  Administered 2021-09-17 – 2021-09-20 (×4): 40 mg via ORAL
  Filled 2021-09-16 (×4): qty 2

## 2021-09-16 MED ORDER — EZETIMIBE 10 MG PO TABS
10.0000 mg | ORAL_TABLET | Freq: Every day | ORAL | Status: DC
  Administered 2021-09-17 – 2021-09-21 (×5): 10 mg via ORAL
  Filled 2021-09-16 (×5): qty 1

## 2021-09-16 MED ORDER — ONDANSETRON HCL 4 MG/2ML IJ SOLN: 4.0000 mg | Freq: Four times a day (QID) | INTRAMUSCULAR | Status: DC | PRN

## 2021-09-16 MED ORDER — PHENYTOIN SODIUM EXTENDED 100 MG PO CAPS
200.0000 mg | ORAL_CAPSULE | Freq: Two times a day (BID) | ORAL | Status: DC
  Filled 2021-09-16: qty 2

## 2021-09-16 MED ORDER — SODIUM CHLORIDE 0.9% FLUSH
3.0000 mL | Freq: Two times a day (BID) | INTRAVENOUS | Status: DC
  Administered 2021-09-16 – 2021-09-21 (×10): 3 mL via INTRAVENOUS

## 2021-09-16 NOTE — Assessment & Plan Note (Addendum)
Echocardiogram with LV EF 70 to 75%, severe LV hypertrophy at the septum. Preserved RV systolic function. Moderate dilatation of the aortic root 45 mm.  ? ?Good urine output appreciated ?Systolic blood pressure 97-105 mmHg.  ?Continue to improve edema with a negative fluid balance since admission of -11.568 ml.  ? ?Transitioning to p.o. torsemide ?-Spironolactone added ?Continue with empagliflozin, metoprolol, ?  ?

## 2021-09-16 NOTE — ED Notes (Signed)
Pt changed, pericare performed, linens changed and pt hooked up to purewick ?

## 2021-09-16 NOTE — Progress Notes (Signed)
Patient transported off the floor on BiPAP from ED to 3E11. No complications at this time. RT will continue to monitor. ?

## 2021-09-16 NOTE — H&P (Signed)
[place] ?History and Physical  ? ? ?Patient: James Villegas XLK:[id number] DOB: Oct 23, 1955 ?DOA: 09/16/2021 ?DOS: the patient was seen and examined on 09/16/2021 ?PCP: Raymondo Band, MD  ?Patient coming from: SNF via EMS ? ?Chief Complaint:  ?Chief Complaint  ?Patient presents with  ? Shortness of Breath  ? ?HPI: James Villegas is a 67 y.o. male with medical history significant of hypertension, hyperlipidemia, chronic atrial fibrillation on anticoagulation, CVA with residual right-sided hemiplegia, chronic diastolic CHF last EF 36-64%, protein S/C deficiency, chronic respiratory failure with hypoxia and hypercapnia, and seizure disorder who presents from a skilled nursing facility for shortness of breath.  History is limited at this time due to patient being lethargic on BiPAP after receiving Ativan for trying to take the BiPAP mask off.  Last hospitalized in 07/2020 for acute on chronic respiratory failure with hypoxia and hypercapnia with acute on chronic diastolic congestive heart failure exacerbation.  Patient improved with diuresis and BiPAP.  His sister notes that he is on oxygen at night and does not use a CPAP or BIPAP mask at night. He is not able to transfer on his own, and therefore was never able to have a formal sleep study in the outpatient setting from the nursing facility. ? ?Upon admission into the emergency department patient was noted to be afebrile, tachycardic, tachypneic, and hypoxic with O2 saturations down into the 80s on nonrebreather.  ABG significant for pH-7.22, PCO2-109, and PaO2 146.  Labs significant for hemoglobin 12.9, sodium 132, CO2 38, BNP 270.4, high-sensitivity troponin 35-> 33, and INR of 3.4.  Chest x-ray noted cardiomegaly with peripheral vascular congestion and and subpleural interstitial edema concerning for CHF with small pleural effusions and asymmetric patchy opacities of the left lung.  Patient was placed on BiPAP with improvement in his work of breathing and given  Lasix 40 mg IV. ? ?Review of Systems: unable to review all systems due to the inability of the patient to answer questions. ?Past Medical History:  ?Diagnosis Date  ? Acute on chronic congestive heart failure ([place])   ? Anemia, secondary   ? SECONDARY TO ACUTE BLOOD LOSS  ? Anxiety disorder   ? Bloody stool 08/29/2011  ? intermittent along with constipation.   ? CVA (cerebral vascular accident) ([place]) 2010  ? 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 Depression   ? DVT of lower extremity (deep venous thrombosis) ([place])   ? RIGHT LOWER; s/p IVC filter 7/12  ? Dysphagia   ? Hyperlipidemia   ? Hypertension   ? Lupus anticoagulant disorder ([place]) 1990  ? Morbid obesity ([place])   ? PFO (patent foramen ovale)   ? TEE 2/10: EF 60%, trivial AI, mild Ao root dilatation, mod PFO with R-L shunting, atrial septal aneurysm;   echo 7/12: EF 65-70%, grade 1 diast dysfxn, mild MR, LVOT showed severe obstruction  ? Protein C deficiency ([place])   ? Protein S deficiency ([place])   ? Rectus sheath hematoma 7/12  ? required reversal of anticoagulation and c/b DVT req. IVC filter  ? Seizure disorder ([place])   ? ?Past Surgical History:  ?Procedure Laterality Date  ? dental extraction    ? multiple  ? vena cavogram  12/2010  ? INFERIOR  ? ?Social History:  reports that he quit smoking about 13 years ago. His smoking use included cigarettes and cigars. He has a 15.00 pack-year smoking history. He has never used smokeless tobacco. He reports that he does not drink alcohol and  does not use drugs. ? ?Allergies  ?Allergen Reactions  ? Fenofibrate Other (See Comments)  ?  Per MAR  ? ? ?Family History  ?Problem Relation Age of Onset  ? Sleep apnea Sister   ? ? ?Prior to Admission medications   ?Medication Sig Start Date End Date Taking? Authorizing Provider  ?acetaminophen (TYLENOL) 325 MG tablet Take 650 mg by mouth every 6 (six) hours as needed for mild pain or headache.     [provider]  ?Carboxymeth-Glycerin-Polysorb (REFRESH OPTIVE  ADVANCED OP) Place 1 drop into both eyes 4 (four) times daily. Wait 3-5 minutes between eye meds    [provider]  ?cycloSPORINE (RESTASIS) 0.05 % ophthalmic emulsion Place 1 drop into both eyes 2 (two) times daily.    [provider]  ?ezetimibe (ZETIA) 10 MG tablet Take 10 mg by mouth daily.    [provider]  ?fish oil-omega-3 fatty acids 1000 MG capsule Take 1 g by mouth 2 (two) times daily with a meal.    [provider]  ?metoprolol tartrate (LOPRESSOR) 25 MG tablet Take 0.5 tablets (12.5 mg total) by mouth 2 (two) times daily. 12/28/18   Lyda Jester M, PA-C  ?omeprazole (PRILOSEC) 40 MG capsule Take 1 capsule (40 mg total) by mouth daily. 02/28/20   Eugenie Filler, MD  ?OXYGEN Inhale into the lungs See admin instructions. Nightly 9p-8a    [provider]  ?phenytoin (DILANTIN) 100 MG ER capsule Take 200 mg by mouth 2 (two) times daily with a meal.    [provider]  ?potassium chloride SA (KLOR-CON) 20 MEQ tablet Take 2 tablets (40 mEq total) by mouth daily. 08/08/20   Aline August, MD  ?rosuvastatin (CRESTOR) 40 MG tablet Take 40 mg by mouth at bedtime.    [provider]  ?torsemide (DEMADEX) 20 MG tablet Take 1 tablet (20 mg total) by mouth 2 (two) times daily. 08/07/20   Aline August, MD  ?venlafaxine (EFFEXOR) 75 MG tablet Take 75 mg by mouth daily. 07/15/18   [provider]  ?warfarin (COUMADIN) 5 MG tablet Take 1 tablet (5 mg total) by mouth daily for 10 days. 08/07/20 08/17/20  Aline August, MD  ? ? ?Physical Exam: ?Vitals:  ? 09/16/21 0530 09/16/21 0545 09/16/21 0600 09/16/21 0615  ?BP: (!) 157/79 (!) 141/82 (!) 144/93 134/73  ?Pulse: (!) 101 (!) 106 67 92  ?Resp: (!) 23 (!) _0 ?Temp:      ?TempSrc:      ?SpO2: 91% 93% 93% 94%  ?Weight:      ?Height:      ? ?Constitutional: Morbidly obese male currently not easily arousable at this time ?Eyes: PERRL, lids and conjunctivae normal ?ENMT: Mucous membranes are  moist.   ?Neck: Increased neck circumference, supple, unable to assess for JVD at this ?Respiratory: Decreased overall aeration with some rales noted on the lower lung fields.  No significant wheezes appreciated at this time.  Currently on BiPAP ?Cardiovascular: Irregular irregular with at least 3+ pitting bilateral lower extremity edema, and of the right upper extremity.    ?Abdomen: Protuberant abdomen.  Bowel sounds appreciated. ?Musculoskeletal: no clubbing / cyanosis. No joint deformity upper and lower extremities.  ?Skin: no rashes, lesions, ulcers. No induration ?Neurologic: Patient has known right-sided hemiplegia from prior stroke. ?Psychiatric: Unable to assess ? ?Data Reviewed: ? ?EKG reveals atrial fibrillation at 91 bpm this tracing.  All labs, imaging, and pertinent records reviewed as noted above in HPI. ? ?  Assessment and Plan: ?Acute on chronic respiratory failure with hypoxia and hypercapnia (HCC) secondary to acute on chronic diastolic CHF exacerbation ?On admission patient was noted to have O2 saturations in the 80s on nonrebreather.  Initial ABG noted pH-7.22, PCO2-109, and PaO2 146 and concern for respiratory acidosis.  BNP elevated at 270.4.  Chest x-ray noted concern for congestive heart failure exacerbation with possibility of infiltrate.  Last echocardiogram revealed EF of 65 to 70% with indeterminate diastolic dysfunction in 1694.  Patient was placed on BiPAP and given Lasix 40 mg IV. ?-Admit to a progressive bed ?-Continuous pulse oximetry ?-BiPAP continuously and nightly once able to be weaned off ?-Strict I&O's and daily weights ?-Check procalcitonin ?-Lasix 40 mg IV twice daily ?-Check echocardiogram ?-Continue beta-blocker ?-Breathing treatments as needed  ?-Consider need to formally consult cardiology if warranted based off echocardiogram in a.m. ? ?Elevated troponin ?Chronic.  High-sensitivity troponin 35-> 33.  Suspect secondary to demand in setting of CHF exacerbation and his  enzymes trending down. ? ?Atrial fibrillation, chronic (HCC) on chronic anticoagulation  protein S and C deficiency ?Patient in chronic atrial fibrillation on anticoagulation.  INR supratherapeutic at 3.4. ?-Continue met

## 2021-09-16 NOTE — Assessment & Plan Note (Addendum)
Right sided hemiparesis, patient has been not ambulatory ?Plan to continue PT and OT ?Continue Coumadin ?

## 2021-09-16 NOTE — TOC Progression Note (Signed)
Transition of Care (TOC) - Progression Note  ? ? ?Patient Details  ?Name: James Villegas ?MRN: [id number] ?Date of Birth: 04-14-1956 ? ?Transition of Care (TOC) CM/SW Contact  ?Leone Haven, RN ?Phone Number: ?09/16/2021, 5:53 PM ? ?Clinical Narrative:    ? ?Transition of Care (TOC) Screening Note ? ? ?Patient Details  ?Name: James Villegas ?Date of Birth: 07/01/1955 ? ? ?Transition of Care (TOC) CM/SW Contact:    ?Leone Haven, RN ?Phone Number: ?09/16/2021, 5:53 PM ? ? ? ?Transition of Care Department [facility]) has reviewed patient and no TOC needs have been identified at this time. We will continue to monitor patient advancement through interdisciplinary progression rounds. If new patient transition needs arise, please place a TOC consult. ?  ? ? ?  ?  ? ?Expected Discharge Plan and Services ?  ?  ?  ?  ?  ?                ?  ?  ?  ?  ?  ?  ?  ?  ?  ?  ? ? ?Social Determinants of Health (SDOH) Interventions ?  ? ?Readmission Risk Interventions ?   ? View : No data to display.  ?  ?  ?  ? ? ?

## 2021-09-16 NOTE — Assessment & Plan Note (Addendum)
Protein S deficiency ?Continue anticoagulation with warfarin ?Is therapeutic,  ?

## 2021-09-16 NOTE — Progress Notes (Signed)
Heart Failure Navigator Progress Note ? ?Following this hospitalization to assess for HV TOC readiness.  ? ?Echo pending?  ?On Bi-pap ? ?Earnestine Leys, BSN, RN ?Heart Failure Nurse Navigator ?Secure Chat Only  ?

## 2021-09-16 NOTE — Assessment & Plan Note (Addendum)
Hypokalemia.  ?Repleted orally ?Patient is responding well to diuresis, renal function with serum cr at 0,55 with K at 4,2 and serum bicarbonate at 41.  ?Continue diuresis with torsemide and spironolactone.  ? ? ?

## 2021-09-16 NOTE — Assessment & Plan Note (Addendum)
Continue with pantoprazole/.  

## 2021-09-16 NOTE — Assessment & Plan Note (Addendum)
Continue phenytoin ?No clinical signs of active seizures.  ?

## 2021-09-16 NOTE — Assessment & Plan Note (Addendum)
On metoprolol for rate control.  ?Anticoagulation with warfarin.  ?INR is 1.6  >>2.0 today  ?

## 2021-09-16 NOTE — Progress Notes (Addendum)
ANTICOAGULATION CONSULT NOTE - Initial Consult ? ?Pharmacy Consult for warfarin ?Indication: atrial fibrillation ? ?Allergies  ?Allergen Reactions  ? Fenofibrate Other (See Comments)  ?  Per MAR  ? ? ?Patient Measurements: ?Height: 5\' 7"  (170.2 cm) ?Weight: 132.8 kg (292 lb 12.3 oz) ?IBW/kg (Calculated) : 66.1 ? ? ?Vital Signs: ?Temp: 98 ?F (36.7 ?C) (04/10 0331) ?Temp Source: Oral (04/10 0331) ?BP: 125/73 (04/10 0945) ?Pulse Rate: 89 (04/10 1051) ? ?Labs: ?Recent Labs  ?  09/16/21 ?0423 09/16/21 ?[id number] 09/16/21 ?1048  ?HGB 12.9*  --  12.9*  ?HCT 40.2  --  38.0*  ?PLT 260  --   --   ?LABPROT 34.1*  --   --   ?INR 3.4*  --   --   ?CREATININE 0.66  --   --   ?TROPONINIHS 35* 33*  --   ? ? ?Estimated Creatinine Clearance: 124.1 mL/min (by C-G formula based on SCr of 0.66 mg/dL). ? ? ?Medical History: ?Past Medical History:  ?Diagnosis Date  ? Acute on chronic congestive heart failure ([place])   ? Anemia, secondary   ? SECONDARY TO ACUTE BLOOD LOSS  ? Anxiety disorder   ? Bloody stool 08/29/2011  ? intermittent along with constipation.   ? CVA (cerebral vascular accident) ([place]) 2010  ? 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 Depression   ? DVT of lower extremity (deep venous thrombosis) ([place])   ? RIGHT LOWER; s/p IVC filter 7/12  ? Dysphagia   ? Hyperlipidemia   ? Hypertension   ? Lupus anticoagulant disorder ([place]) 1990  ? Morbid obesity ([place])   ? PFO (patent foramen ovale)   ? TEE 2/10: EF 60%, trivial AI, mild Ao root dilatation, mod PFO with R-L shunting, atrial septal aneurysm;   echo 7/12: EF 65-70%, grade 1 diast dysfxn, mild MR, LVOT showed severe obstruction  ? Protein C deficiency ([place])   ? Protein S deficiency ([place])   ? Rectus sheath hematoma 7/12  ? required reversal of anticoagulation and c/b DVT req. IVC filter  ? Seizure disorder ([place])   ? ? ?Assessment: ?50 YOM presenting with SOB on BiPAP, hx of afib on warfarin PTA with last dose taken 4/9.  INR on admission slightly supratherapeutic at 3.4, H/H  12.9/38, plts wnl ? ?PTA dosing: 5mg  daily ? ?Goal of Therapy:  ?INR 2-3 ?Monitor platelets by anticoagulation protocol: Yes ?  ?Plan:  ?Warfarin 2.5 mg PO x 1 today ?Daily INR, s/s bleeding ? ?Bertis Ruddy, PharmD ?Clinical Pharmacist ?ED Pharmacist Phone # [phone] ?09/16/2021 11:10 AM ? ?Addendum: ?Patient is on bipap. Will hold warfarin for today and check INR in the morning. ? ?Salome Arnt, PharmD, BCPS ?Clinical Pharmacist ?Please see AMION for all pharmacy numbers ?09/16/2021 1:18 PM ? ?

## 2021-09-16 NOTE — Assessment & Plan Note (Addendum)
BMI 45.85 kg/m? ?Patient will benefit from aggressive weight loss ?May be evaluated by PCP and general surgery for gastric bypass ?

## 2021-09-16 NOTE — ED Notes (Signed)
Pt taken off BiPap for 2nd time. Pt educated about keeping BiPap on after first time taking BiPap off. MD aware  ?

## 2021-09-16 NOTE — ED Provider Notes (Signed)
?[place] ?Provider Note ? ? ?CSN: [id number] ?Arrival date & time: 09/16/21  0330 ? ?  ? ?History ? ?Chief Complaint  ?Patient presents with  ? Shortness of Breath  ? ? ?James Villegas is a 66 y.o. male with history of hypertension, hyperlipidemia, atrial fibrillation anticoagulated on warfarin, stroke with right hemiplegia, CHF, and hypercapnia in the past who presents from his SNF for shortness of breath. ? ?Diaphoretic with significant increased work of breathing, hypoxic to the 80s on nonrebreather at time of my presentation to the bedside.  He is currently receiving a nebulized albuterol treatment at time of my arrival though this was requested to be discontinued after I examined the patient and found him to be fluid overloaded on exam. ? ?Patient with significant swelling in his lower extremities though he states that this  happens frequently for him. ? ?I personally reviewed his medical records.  In addition to theOf listed problems he has history of protein CNS deficiency, PFO, seizure disorder, and acsending aortic aneurysm. ? ? ? ? HPI ? ?  ? ?Home Medications ?Prior to Admission medications   ?Medication Sig Start Date End Date Taking? Authorizing Provider  ?acetaminophen (TYLENOL) 325 MG tablet Take 650 mg by mouth every 6 (six) hours as needed for mild pain or headache.     [provider]  ?Carboxymeth-Glycerin-Polysorb (REFRESH OPTIVE ADVANCED OP) Place 1 drop into both eyes 4 (four) times daily. Wait 3-5 minutes between eye meds    [provider]  ?cycloSPORINE (RESTASIS) 0.05 % ophthalmic emulsion Place 1 drop into both eyes 2 (two) times daily.    [provider]  ?ezetimibe (ZETIA) 10 MG tablet Take 10 mg by mouth daily.    [provider]  ?fish oil-omega-3 fatty acids 1000 MG capsule Take 1 g by mouth 2 (two) times daily with a meal.    [provider]  ?metoprolol tartrate (LOPRESSOR) 25 MG tablet Take 0.5  tablets (12.5 mg total) by mouth 2 (two) times daily. 12/28/18   Lyda Jester M, PA-C  ?omeprazole (PRILOSEC) 40 MG capsule Take 1 capsule (40 mg total) by mouth daily. 02/28/20   Eugenie Filler, MD  ?OXYGEN Inhale into the lungs See admin instructions. Nightly 9p-8a    [provider]  ?phenytoin (DILANTIN) 100 MG ER capsule Take 200 mg by mouth 2 (two) times daily with a meal.    [provider]  ?potassium chloride SA (KLOR-CON) 20 MEQ tablet Take 2 tablets (40 mEq total) by mouth daily. 08/08/20   Aline August, MD  ?rosuvastatin (CRESTOR) 40 MG tablet Take 40 mg by mouth at bedtime.    [provider]  ?torsemide (DEMADEX) 20 MG tablet Take 1 tablet (20 mg total) by mouth 2 (two) times daily. 08/07/20   Aline August, MD  ?venlafaxine (EFFEXOR) 75 MG tablet Take 75 mg by mouth daily. 07/15/18   [provider]  ?warfarin (COUMADIN) 5 MG tablet Take 1 tablet (5 mg total) by mouth daily for 10 days. 08/07/20 08/17/20  Aline August, MD  ?   ? ?Allergies    ?Fenofibrate   ? ?Review of Systems   ?Review of Systems  ?Unable to perform ROS: Acuity of condition  ?Respiratory:  Positive for shortness of breath.   ? ?Physical Exam ?Updated Vital Signs ?BP 134/73   Pulse 92   Temp 98 ?F (36.7 ?C) (Oral)   Resp 17   Ht 5\' 7"  (1.702 m)  Wt 132.8 kg   SpO2 94%   BMI 45.85 kg/m?  ?Physical Exam ?Vitals and nursing note reviewed.  ?Constitutional:   ?   General: He is in acute distress.  ?   Appearance: He is morbidly obese. He is toxic-appearing and diaphoretic.  ?   Interventions: Face mask in place.  ?   Comments: On NRB mask with respiratory distress  ?HENT:  ?   Head: Normocephalic and atraumatic.  ?   Nose: Nose normal.  ?   Mouth/Throat:  ?   Mouth: Mucous membranes are moist.  ?   Pharynx: Oropharynx is clear. Uvula midline. No oropharyngeal exudate or posterior oropharyngeal erythema.  ?Eyes:  ?   General: Lids are normal. Vision grossly intact.     ?   Right eye: No  discharge.     ?   Left eye: No discharge.  ?   Extraocular Movements: Extraocular movements intact.  ?   Conjunctiva/sclera: Conjunctivae normal.  ?   Pupils: Pupils are equal, round, and reactive to light.  ?Neck:  ?   Trachea: Trachea and phonation normal.  ?Cardiovascular:  ?   Rate and Rhythm: Regular rhythm. Tachycardia present.  ?   Pulses: Normal pulses.  ?   Heart sounds: Normal heart sounds. No murmur heard. ?   Comments: Significant nonpitting pedal and pretibial edema ?Pulmonary:  ?   Effort: Tachypnea, accessory muscle usage and respiratory distress present.  ?   Breath sounds: Examination of the right-middle field reveals decreased breath sounds and rales. Examination of the left-middle field reveals decreased breath sounds and rales. Examination of the right-lower field reveals decreased breath sounds and rales. Examination of the left-lower field reveals decreased breath sounds and rales. Decreased breath sounds and rales present. No wheezing.  ?Chest:  ?   Chest wall: No mass, swelling, tenderness or edema.  ?Abdominal:  ?   General: Bowel sounds are normal. There is no distension.  ?   Palpations: Abdomen is soft.  ?   Tenderness: There is no abdominal tenderness.  ?Musculoskeletal:     ?   General: No deformity.  ?   Cervical back: Normal range of motion. No rigidity.  ?   Right lower leg: 4+ Edema present.  ?   Left lower leg: 4+ Edema present.  ?Lymphadenopathy:  ?   Cervical: No cervical adenopathy.  ?Skin: ?   General: Skin is warm.  ?   Capillary Refill: Capillary refill takes less than 2 seconds.  ?Neurological:  ?   General: No focal deficit present.  ?   Mental Status: He is alert and oriented to person, place, and time. Mental status is at baseline.  ?   GCS: GCS eye subscore is 2. GCS verbal subscore is 5. GCS motor subscore is 6.  ?   Comments: Slow to answer questions,   ?Psychiatric:     ?   Mood and Affect: Mood normal.  ? ? ?ED Results / Procedures / Treatments   ?Labs ?(all labs  ordered are listed, but only abnormal results are displayed) ?Labs Reviewed  ?COMPREHENSIVE METABOLIC PANEL - Abnormal; Notable for the following components:  ?    Result Value  ? Sodium 132 (*)   ? Chloride 85 (*)   ? CO2 38 (*)   ? Glucose, Bld 152 (*)   ? Calcium 8.6 (*)   ? Albumin 3.2 (*)   ? All other components within normal limits  ?CBC WITH DIFFERENTIAL/PLATELET - Abnormal; Notable  for the following components:  ? RBC 4.13 (*)   ? Hemoglobin 12.9 (*)   ? Neutro Abs 8.5 (*)   ? Lymphs Abs 0.5 (*)   ? Abs Immature Granulocytes 0.15 (*)   ? All other components within normal limits  ?BRAIN NATRIURETIC PEPTIDE - Abnormal; Notable for the following components:  ? B Natriuretic Peptide 270.4 (*)   ? All other components within normal limits  ?PROTIME-INR - Abnormal; Notable for the following components:  ? Prothrombin Time 34.1 (*)   ? INR 3.4 (*)   ? All other components within normal limits  ?BLOOD GAS, ARTERIAL - Abnormal; Notable for the following components:  ? pH, Arterial 7.22 (*)   ? pCO2 arterial 109 (*)   ? pO2, Arterial 146 (*)   ? Bicarbonate 45.0 (*)   ? Acid-Base Excess 12.7 (*)   ? All other components within normal limits  ?TROPONIN I (HIGH SENSITIVITY) - Abnormal; Notable for the following components:  ? Troponin I (High Sensitivity) 35 (*)   ? All other components within normal limits  ?TROPONIN I (HIGH SENSITIVITY) - Abnormal; Notable for the following components:  ? Troponin I (High Sensitivity) 33 (*)   ? All other components within normal limits  ?RESP PANEL BY RT-PCR (FLU A&B, COVID) ARPGX2  ?URINALYSIS, ROUTINE W REFLEX MICROSCOPIC  ?I-STAT ARTERIAL BLOOD GAS, ED  ? ? ?EKG ?EKG Interpretation ? ?Date/Time:  Monday September 16 2021 03:31:26 EDT ?Ventricular Rate:  91 ?PR Interval:    ?QRS Duration: 144 ?QT Interval:  343 ?QTC Calculation: 422 ?R Axis:   95 ?Text Interpretation: Atrial fibrillation RBBB and LPFB Repol abnrm suggests ischemia, diffuse leads When compared with ECG of 07/30/2020,  No significant change was found Confirmed by Delora Fuel ([id number]) on 09/16/2021 3:51:27 AM ? ?Radiology ?DG Chest Portable 1 View ? ?Result Date: 09/16/2021 ?CLINICAL DATA:  Shortness of breath, dizziness, history of h

## 2021-09-16 NOTE — Assessment & Plan Note (Addendum)
Chronic.  High-sensitivity troponin 35-> 33.  Suspect secondary to demand in setting of CHF exacerbation and his enzymes trending down. ?

## 2021-09-16 NOTE — Assessment & Plan Note (Addendum)
Statin therapy with atorvastatin ?

## 2021-09-17 DIAGNOSIS — E871 Hypo-osmolality and hyponatremia: Secondary | ICD-10-CM | POA: Diagnosis not present

## 2021-09-17 DIAGNOSIS — Z86718 Personal history of other venous thrombosis and embolism: Secondary | ICD-10-CM | POA: Diagnosis not present

## 2021-09-17 DIAGNOSIS — J9621 Acute and chronic respiratory failure with hypoxia: Secondary | ICD-10-CM | POA: Diagnosis not present

## 2021-09-17 DIAGNOSIS — I482 Chronic atrial fibrillation, unspecified: Secondary | ICD-10-CM | POA: Diagnosis not present

## 2021-09-17 LAB — BASIC METABOLIC PANEL
Anion gap: 9 (ref 5–15)
BUN: 12 mg/dL (ref 8–23)
CO2: 39 mmol/L — ABNORMAL HIGH (ref 22–32)
Calcium: 8.7 mg/dL — ABNORMAL LOW (ref 8.9–10.3)
Chloride: 88 mmol/L — ABNORMAL LOW (ref 98–111)
Creatinine, Ser: 0.65 mg/dL (ref 0.61–1.24)
GFR, Estimated: >60 mL/min (ref >60)
Glucose, Bld: 88 mg/dL (ref 70–99)
Potassium: 3.6 mmol/L (ref 3.5–5.1)
Sodium: 136 mmol/L (ref 135–145)

## 2021-09-17 LAB — PROTIME-INR
INR: 3.7 — ABNORMAL HIGH (ref 0.8–1.2)
Prothrombin Time: 36.6 seconds — ABNORMAL HIGH (ref 11.4–15.2)

## 2021-09-17 LAB — MAGNESIUM: Magnesium: 1.9 mg/dL (ref 1.7–2.4)

## 2021-09-17 MED ORDER — FUROSEMIDE 10 MG/ML IJ SOLN
60.0000 mg | Freq: Two times a day (BID) | INTRAMUSCULAR | Status: DC
Start: 2021-09-17 — End: 2021-09-20
  Administered 2021-09-17 – 2021-09-20 (×6): 60 mg via INTRAVENOUS
  Filled 2021-09-17 (×6): qty 6

## 2021-09-17 NOTE — Progress Notes (Signed)
Heart Failure Nurse Navigator Progress Note ? ?Attempted to interview patient regarding HV TOC readiness/appropriateness. Pt currently still on BiPAP, watching TV. Per bedside RN pt AOx4, which is an improvement from AM shift report.  ? ?Will continue to follow to complete interview process. ? ?Pricilla Holm, MSN, RN ?Heart Failure Nurse Navigator ?

## 2021-09-17 NOTE — Evaluation (Signed)
Physical Therapy Evaluation ?Patient Details ?Name: James Villegas ?MRN: [id number] ?DOB: August 12, 1955 ?Today's Date: 09/17/2021 ? ?History of Present Illness ? Patient is a 66 y/o male who presents on 4/10 with SOB. Found to have working diagnosis of decompensated heart failure. PMH includes CVA with right sided weakness, A-fib, HTN, obesity, bradycardia, seizure disorder, CHF.  ?Clinical Impression ? Patient resides at [facility] and requires total A for hoyer lift to his power chair for mobility. Pt reports being at his baseline level of function. No longer requiring BiPAP and back on 4L/min 02 [place] with Sp02 in 90s.Total A of 2 to reposition in bed for proper positioning to eat lunch this date. PT recommends pt return to his facility at discharge. Encouraged RN to use hoyer lift to transfer pt to recliner chair during hospital stay. Given pt is at baseline PT signing off at this time.  ?   ? ?Recommendations for follow up therapy are one component of a multi-disciplinary discharge planning process, led by the attending physician.  Recommendations may be updated based on patient status, additional functional criteria and insurance authorization. ? ?Follow Up Recommendations Skilled nursing-short term rehab (<3 hours/day) ? ?  ?Assistance Recommended at Discharge Frequent or constant Supervision/Assistance  ?Patient can return home with the following ?   ? ?  ?Equipment Recommendations None recommended by PT  ?Recommendations for Other Services ?    ?  ?Functional Status Assessment Patient has not had a recent decline in their functional status  ? ?  ?Precautions / Restrictions Precautions ?Precautions: Other (comment) ?Precaution Comments: Right hemi at baseline, watch 02 ?Restrictions ?Weight Bearing Restrictions: No  ? ?  ? ?Mobility ? Bed Mobility ?Overal bed mobility: Needs Assistance ?  ?  ?  ?  ?  ?  ?General bed mobility comments: Total A to reposition pt in bed for optimal position to eat lunch ?   ? ?Transfers ?  ?  ?  ?  ?  ?  ?  ?  ?  ?  ?  ? ?Ambulation/Gait ?  ?  ?  ?  ?  ?  ?  ?  ? ?Stairs ?  ?  ?  ?  ?  ? ?Wheelchair Mobility ?  ? ?Modified Rankin (Stroke Patients Only) ?  ? ?  ? ?Balance   ?  ?  ?  ?  ?  ?  ?  ?  ?  ?  ?  ?  ?  ?  ?  ?  ?  ?  ?   ? ? ? ?Pertinent Vitals/Pain Pain Assessment ?Pain Assessment: No/denies pain  ? ? ?Home Living Family/patient expects to be discharged to:: Skilled nursing facility ?  ?  ?  ?  ?  ?  ?  ?  ?  ?Additional Comments: Resides at [facility] for the past 1.5 years.  ?  ?Prior Function Prior Level of Function : Needs assist ?  ?  ?  ?  ?  ?  ?Mobility Comments: hoyer lift to transfer to power w/c, able to mobilize with power w/c using LUE ?ADLs Comments: Assist for ADLs. ?  ? ? ?Hand Dominance  ? Dominant Hand: Left ? ?  ?Extremity/Trunk Assessment  ? Upper Extremity Assessment ?Upper Extremity Assessment: Defer to OT evaluation ?  ? ?Lower Extremity Assessment ?Lower Extremity Assessment: LLE deficits/detail ?RLE Deficits / Details: Dense hemi- no active movement, swelling present. Can feel light senstion on plantar surface of foot and pressure  to RUE but no light touch. ?RLE Sensation: decreased light touch;decreased proprioception ?RLE Coordination: decreased gross motor;decreased fine motor ?LLE Deficits / Details: Able to wiggle toes, move ankle and can perform heel slide partially. ?LLE Sensation: WNL ?  ? ?   ?Communication  ? Communication: Expressive difficulties (baseline per previous notes)  ?Cognition Arousal/Alertness: Awake/alert ?Behavior During Therapy: Flat affect ?Overall Cognitive Status: Within Functional Limits for tasks assessed ?  ?  ?  ?  ?  ?  ?  ?  ?  ?  ?  ?  ?  ?  ?  ?  ?General Comments: Expressive speech deficits but otherwise appears WFLs. ?  ?  ? ?  ?General Comments General comments (skin integrity, edema, etc.): Pt on 4L/min 02 [place] at baseline. ? ?  ?Exercises    ? ?Assessment/Plan  ?  ?PT Assessment Patient does not need any  further PT services  ?PT Problem List   ? ?   ?  ?PT Treatment Interventions     ? ?PT Goals (Current goals can be found in the Care Plan section)  ?Acute Rehab PT Goals ?Patient Stated Goal: eat some food ?PT Goal Formulation: All assessment and education complete, DC therapy ? ?  ?Frequency   ?  ? ? ?Co-evaluation   ?  ?  ?  ?  ? ? ?  ?AM-PAC PT "6 Clicks" Mobility  ?Outcome Measure Help needed turning from your back to your side while in a flat bed without using bedrails?: Total ?Help needed moving from lying on your back to sitting on the side of a flat bed without using bedrails?: Total ?Help needed moving to and from a bed to a chair (including a wheelchair)?: Total ?Help needed standing up from a chair using your arms (e.g., wheelchair or bedside chair)?: Total ?Help needed to walk in hospital room?: Total ?Help needed climbing 3-5 steps with a railing? : Total ?6 Click Score: 6 ? ?  ?End of Session Equipment Utilized During Treatment: Oxygen ?Activity Tolerance: Patient tolerated treatment well ?Patient left: in bed;with call bell/phone within reach;with bed alarm set ?Nurse Communication: Need for lift equipment ?PT Visit Diagnosis: Hemiplegia and hemiparesis ?Hemiplegia - Right/Left: Right ?Hemiplegia - dominant/non-dominant: Non-dominant ?Hemiplegia - caused by: Cerebral infarction (hx of cVA) ?  ? ?Time: [id number] ?PT Time Calculation (min) (ACUTE ONLY): 12 min ? ? ?Charges:   PT Evaluation ?$PT Eval Moderate Complexity: 1 Mod ?  ?  ?   ? ? ?Marisa Severin, PT, DPT ?Acute Rehabilitation Services ?Secure chat preferred ?Office [phone] ? ? ? ? ?[place] ?09/17/2021, 3:14 PM ? ?

## 2021-09-17 NOTE — Hospital Course (Addendum)
James Villegas was admitted to the hospital with the working diagnosis of decompensated heart failure.  ? ?66 yo male with the past medical history of hypertension, dyslipidemia, atrial fibrillation, CVA with residual right hemiplegia, protein C and S deficiency and heart failure who presented with dyspnea. Patient was transferred to the ED from skill nursing facility due to respiratory distress. Limited history due to patient critically ill. He was placed on non invasive mechanical ventilation in the ED, because respiratory failure, his oxygenation was in the 80's on non rebreather mask, blood pressure 157/79, HR 101, RR 23, 02 saturation 94% on Bipap. Lungs with decreased ventilation, positive rales, heart with S1 and S2 present, irregularly irregular, with no gallops, abdomen protuberant and lower extremity with +++ pitting edema bilaterally.  ? ?ABG 7.22/ 109/ 146/ 45/ 99%.  ? ?Na 132, K 4.3 CL 85, bicarbonate 38, glucose 152, bun 8 cr 0,6 ?BNP 270 ?Wbc 9.9 hgb 12.9 plt 260 ? ?Chest radiograph with cardiomegaly bilateral interstitial infiltrates, with hilar vascular congestion, more dense infiltrates on the left lower and left upper lobe.  ? ?EKG 91 bpm, right axis deviation with right bundle branch block, atrial fibrillation with no significant ST segment or T wave changes.  ? ?Patient placed on aggressive diuresis with improvement in his volume status.  ?

## 2021-09-17 NOTE — Evaluation (Signed)
Occupational Therapy Evaluation and Discharge ?Patient Details ?Name: James Villegas ?MRN: [id number] ?DOB: 1955/09/19 ?Today's Date: 09/17/2021 ? ? ?History of Present Illness 66 y/o male who presents on 4/10 with SOB. Found to have working diagnosis of decompensated heart failure. PMH includes CVA with right sided weakness, A-fib, HTN, obesity, bradycardia, seizure disorder, CHF.  ? ?Clinical Impression ?  ?PTA, pt was at [facility] and requiring assistance for ADLs from staff. Use of hoyer lift to/from wheelchair. Pt was not receiving therapies at [facility]. Pt currently presenting at baseline and requiring set up for self feeding and grooming; Total A for bathing,dressing, and toileting. Recommend dc to [facility] once medically stable per physician. All acute OT needs met and will sign off.  ?   ? ?Recommendations for follow up therapy are one component of a multi-disciplinary discharge planning process, led by the attending physician.  Recommendations may be updated based on patient status, additional functional criteria and insurance authorization.  ? ?Follow Up Recommendations ? Long-term institutional care without follow-up therapy  ?  ?Assistance Recommended at Discharge    ?Patient can return home with the following   ? ?  ?Functional Status Assessment ?    ?Equipment Recommendations ? None recommended by OT  ?  ?Recommendations for Other Services   ? ? ?  ?Precautions / Restrictions Precautions ?Precautions: Other (comment) ?Precaution Comments: Right hemi at baseline, watch 02 ?Restrictions ?Weight Bearing Restrictions: No  ? ?  ? ?Mobility Bed Mobility ?Overal bed mobility: Needs Assistance ?  ?  ?  ?  ?  ?  ?General bed mobility comments: Total A to reposition pt in bed for optimal position to eat lunch ?  ? ?Transfers ?  ?  ?  ?  ?  ?  ?  ?  ?  ?General transfer comment: Defer ?  ? ?  ?Balance   ?  ?  ?  ?  ?  ?  ?  ?  ?  ?  ?  ?  ?  ?  ?  ?  ?  ?  ?   ? ?ADL either performed or assessed with  clinical judgement  ? ?ADL Overall ADL's : At baseline ?  ?  ?  ?  ?  ?  ?  ?  ?  ?  ?  ?  ?  ?  ?  ?  ?  ?  ?  ?General ADL Comments: Assist to set up for feeding (lunch at bed level with bed in chair position) and Total A for bathing, dressing, and toileting.  ? ? ? ?Vision   ?   ?   ?Perception   ?  ?Praxis   ?  ? ?Pertinent Vitals/Pain Pain Assessment ?Pain Assessment: No/denies pain  ? ? ? ?Hand Dominance Left ?  ?Extremity/Trunk Assessment Upper Extremity Assessment ?Upper Extremity Assessment: RUE deficits/detail ?RUE Deficits / Details: R hemi paresis. No acitve movement. Poor light touch sensation ?RUE Coordination: decreased fine motor;decreased gross motor ?  ?Lower Extremity Assessment ?Lower Extremity Assessment: LLE deficits/detail ?RLE Deficits / Details: Dense hemi- no active movement, swelling present. Can feel light senstion on plantar surface of foot and pressure to RUE but no light touch. ?RLE Sensation: decreased light touch;decreased proprioception ?RLE Coordination: decreased gross motor;decreased fine motor ?LLE Deficits / Details: Able to wiggle toes, move ankle and can perform heel slide partially. ?LLE Sensation: WNL ?  ?  ?  ?Communication Communication ?Communication: Expressive difficulties (baseline per previous notes) ?  ?  Cognition Arousal/Alertness: Awake/alert ?Behavior During Therapy: Flat affect ?Overall Cognitive Status: Within Functional Limits for tasks assessed ?  ?  ?  ?  ?  ?  ?  ?  ?  ?  ?  ?  ?  ?  ?  ?  ?General Comments: Expressive speech deficits but otherwise appears WFLs. ?  ?  ?General Comments  Pt on 4L/min 02 [place] at baseline. ? ?  ?Exercises   ?  ?Shoulder Instructions    ? ? ?Home Living Family/patient expects to be discharged to:: Skilled nursing facility ?  ?  ?  ?  ?  ?  ?  ?  ?  ?  ?  ?  ?  ?  ?  ?  ?Additional Comments: Resides at [facility] for the past 1.5 years. ?  ? ?  ?Prior Functioning/Environment Prior Level of Function : Needs assist ?  ?  ?  ?  ?  ?   ?Mobility Comments: hoyer lift to transfer to power w/c, able to mobilize with power w/c using LUE ?ADLs Comments: Assist for ADLs. ?  ? ?  ?  ?OT Problem List: Decreased strength;Decreased range of motion;Impaired balance (sitting and/or standing) ?  ?   ?OT Treatment/Interventions:    ?  ?OT Goals(Current goals can be found in the care plan section) Acute Rehab OT Goals ?Patient Stated Goal: Return to [facility] ?OT Goal Formulation: All assessment and education complete, DC therapy  ?OT Frequency:   ?  ? ?Co-evaluation   ?  ?  ?  ?  ? ?  ?AM-PAC OT "6 Clicks" Daily Activity     ?Outcome Measure Help from another person eating meals?: A Little ?Help from another person taking care of personal grooming?: A Little ?Help from another person toileting, which includes using toliet, bedpan, or urinal?: Total ?Help from another person bathing (including washing, rinsing, drying)?: Total ?Help from another person to put on and taking off regular upper body clothing?: Total ?Help from another person to put on and taking off regular lower body clothing?: Total ?6 Click Score: 10 ?  ?End of Session Equipment Utilized During Treatment: Oxygen ?Nurse Communication: Mobility status ? ?Activity Tolerance: Patient tolerated treatment well ?Patient left: in bed;with call bell/phone within reach ? ?OT Visit Diagnosis: Unsteadiness on feet (R26.81);Other abnormalities of gait and mobility (R26.89);Muscle weakness (generalized) (M62.81)  ?              ?Time: 3353-3174 ?OT Time Calculation (min): 11 min ?Charges:  OT General Charges ?$OT Visit: 1 Visit ?OT Evaluation ?$OT Eval Moderate Complexity: 1 Mod ? ?Kenson Groh MSOT, OTR/L ?Acute Rehab ?Pager: [phone] ?Office: [phone] ? ?Mohd Clemons M Argie Applegate ?09/17/2021, 4:07 PM ?

## 2021-09-17 NOTE — Social Work (Signed)
Pt is from [facility], CSW visited pt in room to discuss DC plan. Pt was resting on Bipap and not responding to CSW attempts to speak about DC plan.  ?CSW attempted to contact [facility] admissions to provide follow up up on pt status on returning, no answer CSW will follow up and continue to follow pt for DC planning needs.  ?

## 2021-09-17 NOTE — Progress Notes (Signed)
Heart Failure Navigator Progress Note ? ?Assessed for Heart & Vascular TOC clinic readiness.  ?Patient does not meet criteria due to non-ambulatory status s/p CVA with R hemiplegia. From long-term SNF, [facility]. Pt would not benefit from [facility] clinic. Progressed off BiPAP to 7L HFNC by RRT.  ? ?Navigator available for educational resources. ? ?Pricilla Holm, MSN, RN ?Heart Failure Nurse Navigator ? ? ?

## 2021-09-17 NOTE — Progress Notes (Signed)
Pt removed from BIPAP and placed on 7L HFNC. Sats are currently 96%. Pt is tolerating well at this time.  ?

## 2021-09-17 NOTE — Progress Notes (Signed)
ANTICOAGULATION CONSULT NOTE  ? ?Pharmacy Consult for warfarin ?Indication: atrial fibrillation ? ?Allergies  ?Allergen Reactions  ? Fenofibrate Other (See Comments)  ?  Per MAR  ? ? ?Patient Measurements: ?Height: 5\' 7"  (170.2 cm) ?Weight: (!) 143.5 kg (316 lb 5.8 oz) ?IBW/kg (Calculated) : 66.1 ? ? ?Vital Signs: ?Temp: 98.2 ?F (36.8 ?C) (04/11 0800) ?Temp Source: Oral (04/11 0800) ?BP: 103/58 (04/11 0800) ?Pulse Rate: 95 (04/11 0800) ? ?Labs: ?Recent Labs  ?  09/16/21 ?0423 09/16/21 ?[id number] 09/16/21 ?1048 09/17/21 ?0337  ?HGB 12.9*  --  12.9*  --   ?HCT 40.2  --  38.0*  --   ?PLT 260  --   --   --   ?LABPROT 34.1*  --   --  36.6*  ?INR 3.4*  --   --  3.7*  ?CREATININE 0.66  --   --  0.65  ?TROPONINIHS 35* 33*  --   --   ? ? ? ?Estimated Creatinine Clearance: 129.8 mL/min (by C-G formula based on SCr of 0.65 mg/dL). ? ? ?Medical History: ?Past Medical History:  ?Diagnosis Date  ? Acute on chronic congestive heart failure ([place])   ? Anemia, secondary   ? SECONDARY TO ACUTE BLOOD LOSS  ? Anxiety disorder   ? Bloody stool 08/29/2011  ? intermittent along with constipation.   ? CVA (cerebral vascular accident) ([place]) 2010  ? 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 Depression   ? DVT of lower extremity (deep venous thrombosis) ([place])   ? RIGHT LOWER; s/p IVC filter 7/12  ? Dysphagia   ? Hyperlipidemia   ? Hypertension   ? Lupus anticoagulant disorder ([place]) 1990  ? Morbid obesity ([place])   ? PFO (patent foramen ovale)   ? TEE 2/10: EF 60%, trivial AI, mild Ao root dilatation, mod PFO with R-L shunting, atrial septal aneurysm;   echo 7/12: EF 65-70%, grade 1 diast dysfxn, mild MR, LVOT showed severe obstruction  ? 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 Protein S deficiency ([place])   ? Rectus sheath hematoma 7/12  ? required reversal of anticoagulation and c/b DVT req. IVC filter  ? Seizure disorder ([place])   ? ? ?Assessment: ?71 YOM presenting with SOB on BiPAP, hx of afib on warfarin PTA with last dose taken 4/9.  INR on admission  slightly supratherapeutic at 3.4, H/H 12.9/38, plts wnl. Warfarin dose was held on 4/10 with new NPO status. INR today (4/11) is remaining supratherapeutic at 3.7. No new CBC today but RN was messaged and patient showing no s/sx of bleeding. Will continue to monitor closely. ? ?PTA dosing: 5mg  daily ? ?Goal of Therapy:  ?INR 2-3 ?Monitor platelets by anticoagulation protocol: Yes ?  ?Plan:  ?Hold warfarin for today ?Daily INR, CBC, s/s bleeding, PO intake ? ?Criselda Starke Anastasovites BS ?Pharmacy Student ?09/17/2021 10:17 AM ? ?

## 2021-09-17 NOTE — Progress Notes (Signed)
?Progress Note ? ? ?Patient: James Villegas UXN:[id number] DOB: Apr 18, 1956 DOA: 09/16/2021     1 ?DOS: the patient was seen and examined on 09/17/2021 ?  ?Brief hospital course: ?James Villegas was admitted to the hospital with the working diagnosis of decompensated heart failure.  ? ?66 yo male with the past medical history of hypertension, dyslipidemia, atrial fibrillation, CVA with residual right hemiplegia, protein C and S deficiency and heart failure who presented with dyspnea. Patient was transferred to the ED from skill nursing facility due to respiratory distress. Limited history due to patient critically ill. He was placed on non invasive mechanical ventilation in the ED, because respiratory failure, his oxygenation was in the 80's on non rebreather mask, blood pressure 157/79, HR 101, RR 23, 02 saturation 94% on Bipap. Lungs with decreased ventilation, positive rales, heart with S1 and S2 present, irregularly irregular, with no gallops, abdomen protuberant and lower extremity with +++ pitting edema bilaterally.  ? ?ABG 7.22/ 109/ 146/ 45/ 99%.  ? ?Na 132, K 4.3 CL 85, bicarbonate 38, glucose 152, bun 8 cr 0,6 ?BNP 270 ?Wbc 9.9 hgb 12.9 plt 260 ? ?Chest radiograph with cardiomegaly bilateral interstitial infiltrates, with hilar vascular congestion, more dense infiltrates on the left lower and left upper lobe.  ? ?EKG 91 bpm, right axis deviation with right bundle branch block, atrial fibrillation with no significant ST segment or T wave changes.  ? ? ? ? ?Assessment and Plan: ?* Acute on chronic respiratory failure with hypoxia and hypercapnia (HCC) secondary to acute on chronic diastolic CHF exacerbation ?Echocardiogram with LV EF 70 to 75%, severe LV hypertrophy at the septum. Preserved RV systolic function. Moderate dilatation of the aortic root 45 mm.  ? ?Patient continue with hypervolemia ?Urine output over last 24 hrs is 1,150 ml. ?Systolic blood pressure 103 to 110 mmHg.  ? ?Plan to continue diuresis with  IV furosemide, will increase dose to 60 mg IV q12 hrs to target further negative fluid balance.  ? ?  ? ?Atrial fibrillation, chronic (HCC) on chronic anticoagulation  protein S and C deficiency ?James Villegas controlled atrial fibrillation, continue with metoprolol and anticoagulation with warfarin.  ?INR is 3.7  ? ?Hyponatremia ?Patient continue with hypervolemia ?Follow up Na is 136, K is 3,6 and serum bicarbonate at 39, cr is 0,65. ? ?Plan to continue diuresis with furosemide, follow up renal function in am.  ? ?History of DVT (deep vein thrombosis) ?Protein S deficiency ?Continue anticoagulation with warfarin ?INR is 3,7  ? ?History of CVA with residual deficit ?Right sided hemiparesis, patient has been not ambulatory ?Plan to continue PT and OT ?Neuro checks per unit protocol and anticoagulation for atrial fibrillation.  ? ?Seizure disorder (HCC) ?Continue phenytoin ?No clinical signs of active seizures.  ? ?Hyperlipidemia ?Statin therapy with atorvastatin ? ?GERD (gastroesophageal reflux disease) ?Continue with pantoprazole.  ? ?Obesity, Class III, BMI 40-49.9 (morbid obesity) (HCC) ?BMI 45.85 kg/m? ? ? ? ? ?  ? ?Subjective: Patient is feeling better, he is more awake and alert, positive 02 desaturation when off bipap. He dose not remember the events that leaded to his hospitalization  ? ?Physical Exam: ?Vitals:  ? 09/17/21 0757 09/17/21 0800 09/17/21 1027 09/17/21 1127  ?BP:  (!) 103/58 111/62   ?Pulse: (!) 107 95 88 86  ?Resp: (!) 27 (!) 23 (!) 24 (!) 23  ?Temp:  98.2 ?F (36.8 ?C) 99.2 ?F (37.3 ?C)   ?TempSrc:  Oral Axillary   ?SpO2: 91% 97% 97% 98%  ?  Weight:      ?Height:      ? ? ?Neurology awake and alert, chronic right hemiparesis ?ENT with mild pallor ?Cardiovascular with S1 and S2 present irregularly irregular with no gallops or murmurs ?Neck with no JVD (wide neck) ?Positive lower extremity edema +++ pitting bilaterally up to the thighs.  ?Respiratory with rales at the dependent zones, with no wheezing or  rhonchi ?Abdomen protuberant but not distended ? ?Data Reviewed: ? ? ? ?Family Communication: no family at the bedside  ? ?Disposition: ?Status is: Inpatient ?Remains inpatient appropriate because: heart failure  ? Planned Discharge Destination: Home ? ? ? ?Author: ?Coralie Keens, MD ?09/17/2021 12:24 PM ? ?For on call review [email or web address].  ?

## 2021-09-18 DIAGNOSIS — E871 Hypo-osmolality and hyponatremia: Secondary | ICD-10-CM | POA: Diagnosis not present

## 2021-09-18 DIAGNOSIS — Z86718 Personal history of other venous thrombosis and embolism: Secondary | ICD-10-CM | POA: Diagnosis not present

## 2021-09-18 DIAGNOSIS — I482 Chronic atrial fibrillation, unspecified: Secondary | ICD-10-CM | POA: Diagnosis not present

## 2021-09-18 DIAGNOSIS — J9621 Acute and chronic respiratory failure with hypoxia: Secondary | ICD-10-CM | POA: Diagnosis not present

## 2021-09-18 LAB — PROTIME-INR
INR: 2 — ABNORMAL HIGH (ref 0.8–1.2)
Prothrombin Time: 22.4 seconds — ABNORMAL HIGH (ref 11.4–15.2)

## 2021-09-18 LAB — BASIC METABOLIC PANEL
Anion gap: 5 (ref 5–15)
BUN: 9 mg/dL (ref 8–23)
CO2: 40 mmol/L — ABNORMAL HIGH (ref 22–32)
Calcium: 8.4 mg/dL — ABNORMAL LOW (ref 8.9–10.3)
Chloride: 97 mmol/L — ABNORMAL LOW (ref 98–111)
Creatinine, Ser: 0.57 mg/dL — ABNORMAL LOW (ref 0.61–1.24)
GFR, Estimated: >60 mL/min (ref >60)
Glucose, Bld: 90 mg/dL (ref 70–99)
Potassium: 3.2 mmol/L — ABNORMAL LOW (ref 3.5–5.1)
Sodium: 142 mmol/L (ref 135–145)

## 2021-09-18 MED ORDER — POTASSIUM CHLORIDE CRYS ER 20 MEQ PO TBCR
40.0000 meq | EXTENDED_RELEASE_TABLET | Freq: Once | ORAL | Status: AC
Start: 2021-09-18 — End: 2021-09-18
  Administered 2021-09-18: 40 meq via ORAL
  Filled 2021-09-18: qty 2

## 2021-09-18 MED ORDER — PHENYTOIN SODIUM EXTENDED 100 MG PO CAPS
200.0000 mg | ORAL_CAPSULE | Freq: Two times a day (BID) | ORAL | Status: DC
  Administered 2021-09-18 – 2021-09-21 (×7): 200 mg via ORAL
  Filled 2021-09-18 (×8): qty 2

## 2021-09-18 MED ORDER — WARFARIN SODIUM 5 MG PO TABS
5.0000 mg | ORAL_TABLET | Freq: Once | ORAL | Status: AC
  Administered 2021-09-18: 5 mg via ORAL
  Filled 2021-09-18: qty 1

## 2021-09-18 NOTE — TOC Initial Note (Signed)
Transition of Care (TOC) - Initial/Assessment Note  ? ? ?Patient Details  ?Name: James Villegas ?MRN: [id number] ?Date of Birth: Jan 05, 1956 ? ?Transition of Care (TOC) CM/SW Contact:    ?[place], LCSW ?Phone Number: ?09/18/2021, 10:12 AM ? ?Clinical Narrative:         ? ?CSW contacted [place] with [place] and confirmed that pt is LTC at [facility]. She notified CSW that she is on vacation this week so TOC would need to just call the facility at [place].          ? ? ?Expected Discharge Plan: [place] ?Barriers to Discharge: Continued Medical Work up ? ? ? ? ?Expected Discharge Plan and Services ?Expected Discharge Plan: [place] ?  ?  ?  ?Living arrangements for the past 2 months: [place] ?                ?  ?  ?  ?  ?  ?  ?  ?  ?  ?  ? ?Prior Living Arrangements/Services ?Living arrangements for the past 2 months: [place] ?Lives with:: Facility Resident ?  ?       ?  ?  ? ?Admission diagnosis:  Respiratory distress [R06.03] ?Acute exacerbation of CHF (congestive heart failure) ([place]) [I50.9] ?Patient Active Problem List  ? Diagnosis Date Noted  ? Acute on chronic respiratory failure with hypoxia and hypercapnia (HCC) secondary to acute on chronic diastolic CHF exacerbation 09/16/2021  ? Hyponatremia 09/16/2021  ? History of DVT (deep vein thrombosis) 09/16/2021  ? GERD (gastroesophageal reflux disease) 09/16/2021  ? Aneurysm of ascending aorta (HCC)   ? Hypoxia   ? CHF exacerbation ([place]) 07/30/2020  ? Acute on chronic diastolic CHF (congestive heart failure) ([place]) 07/30/2020  ? Acute on chronic respiratory failure with hypercapnia ([place]) 07/30/2020  ? Pressure injury of skin 02/22/2020  ? AKI (acute kidney injury) ([place])   ? HCAP (healthcare-associated pneumonia)   ? Acute metabolic encephalopathy   ? Hypercoagulable state ([place])   ? Seizure disorder ([place])   ? Chronic diastolic CHF (congestive heart failure) ([place])   ? Atrial fibrillation, chronic (HCC) on  chronic anticoagulation  protein S and C deficiency   ? History of CVA with residual deficit   ? Hyperlipidemia   ? Acute respiratory failure ([place]) 02/20/2020  ? CAP (community acquired pneumonia) 02/20/2020  ? Obesity, Class III, BMI 40-49.9 (morbid obesity) ([place]) 02/20/2020  ? Bradycardia 02/20/2020  ? Hyperkalemia 02/20/2020  ? Hypercapnic respiratory failure ([place]) 02/20/2020  ? Acute on chronic congestive heart failure ([place])   ? New onset atrial fibrillation ([place])   ? Respiratory failure ([place]) 08/02/2018  ? Anemia of other chronic disease 11/23/2013  ? Pain in joint, ankle and foot 11/14/2013  ? Other convulsions 10/19/2013  ? Noninfectious gastroenteritis and colitis 07/29/2013  ? Left upper quadrant abdominal mass 07/28/2013  ? Other specified disease of white blood cells 07/16/2013  ? Ingrowing toenail with infection 05/20/2013  ? Late effects of cerebrovascular disease 10/15/2012  ? Pure hypercholesterolemia 10/15/2012  ? Essential hypertension, benign 10/15/2012  ? Hereditary and idiopathic peripheral neuropathy 10/15/2012  ? Cerebral infarction ([place]) 08/29/2011  ? DVT (deep venous thrombosis) ([place]) 08/29/2011  ? Bloody stool 08/29/2011  ? PFO (patent foramen ovale) 02/18/2011  ? ?PCP:  Raymondo Band, MD ?Pharmacy:  No Pharmacies Listed ? ? ? ?Social Determinants of Health (SDOH) Interventions ?  ? ?Readmission Risk Interventions ?   ?  View : No data to display.  ?  ?  ?  ? ? ? ?

## 2021-09-18 NOTE — Progress Notes (Signed)
?Progress Note ? ? ?Patient: James Villegas [id number] DOB: 1955-10-06 DOA: 09/16/2021     2 ?DOS: the patient was seen and examined on 09/18/2021 ?  ?Brief hospital course: ?Mr. Santopietro was admitted to the hospital with the working diagnosis of decompensated heart failure.  ? ?66 yo male with the past medical history of hypertension, dyslipidemia, atrial fibrillation, CVA with residual right hemiplegia, protein C and S deficiency and heart failure who presented with dyspnea. Patient was transferred to the ED from skill nursing facility due to respiratory distress. Limited history due to patient critically ill. He was placed on non invasive mechanical ventilation in the ED, because respiratory failure, his oxygenation was in the 80's on non rebreather mask, blood pressure 157/79, HR 101, RR 23, 02 saturation 94% on Bipap. Lungs with decreased ventilation, positive rales, heart with S1 and S2 present, irregularly irregular, with no gallops, abdomen protuberant and lower extremity with +++ pitting edema bilaterally.  ? ?ABG 7.22/ 109/ 146/ 45/ 99%.  ? ?Na 132, K 4.3 CL 85, bicarbonate 38, glucose 152, bun 8 cr 0,6 ?BNP 270 ?Wbc 9.9 hgb 12.9 plt 260 ? ?Chest radiograph with cardiomegaly bilateral interstitial infiltrates, with hilar vascular congestion, more dense infiltrates on the left lower and left upper lobe.  ? ?EKG 91 bpm, right axis deviation with right bundle branch block, atrial fibrillation with no significant ST segment or T wave changes.  ? ?Patient placed on diuresis.  ? ?Assessment and Plan: ?* Acute on chronic respiratory failure with hypoxia and hypercapnia (HCC) secondary to acute on chronic diastolic CHF exacerbation ?Echocardiogram with LV EF 70 to 75%, severe LV hypertrophy at the septum. Preserved RV systolic function. Moderate dilatation of the aortic root 45 mm.  ? ?Urine output over last 24 hrs is 5,050 ml. ?Systolic blood pressure 88 to 1129 mmHg.  ? ?Continuee diuresis with 60 mg IV q12 hrs  of furosemide.  ?Close blood pressure monitoring.  ? ?  ? ?Atrial fibrillation, chronic (HCC) on chronic anticoagulation  protein S and C deficiency ?Continue rate control with metoprolol ?Anticoagulation with warfarin.  ?INR is 2,0  ? ?Hyponatremia ?Improving volume status but not back to baseline. ?Continue diuresis with furosemide ?Today renal function with serum cr at 0,57, K is 3,2 and serum bicarbonate is 40. ? ?Plan to continue close monitoring of renal function and electrolytes.  ?Add 80 meq Kcl in 2 divided doses.  ?Check Mg in am.  ? ?History of DVT (deep vein thrombosis) ?Protein S deficiency ?Continue anticoagulation with warfarin ?Is therapeutic, continue to follow with pharmacy protocol.  ? ?History of CVA with residual deficit ?Right sided hemiparesis, patient has been not ambulatory ?Plan to continue PT and OT ?Neuro checks per unit protocol and anticoagulation for atrial fibrillation.  ? ?Seizure disorder ([place]) ?Continue phenytoin ?No clinical signs of active seizures.  ? ?Hyperlipidemia ?Statin therapy with atorvastatin ? ?GERD (gastroesophageal reflux disease) ?Continue with pantoprazole.  ? ?Obesity, Class III, BMI 40-49.9 (morbid obesity) ([place]) ?BMI 45.85 kg/m? ? ? ? ? ?  ? ?Subjective: Patient with improvement in edema and dyspnea but not back to baseline ? ? ?Physical Exam: ?Vitals:  ? 09/18/21 0305 09/18/21 0308 09/18/21 0732 09/18/21 0736  ?BP:  (!) 108/51 111/67   ?Pulse:  70  77  ?Resp:  18 19 (!) 22  ?Temp:  98.4 ?F (36.9 ?C) (!) 97.3 ?F (36.3 ?C)   ?TempSrc:  Axillary Axillary   ?SpO2:  95%  95%  ?Weight: (!) 143.7 kg     ?  Height:      ? ?Neurology awake and alert ?ENT with no pallor ?Cardiovascular with S1 and S2 present with no gallops, rubs or murmur ?No JVD wide neck  ?Positive lower extremity edema +++ pitting bilaterally ?Respiratory with rales at bases.  ?Abdomen not distended, but protuberant.  ?Data Reviewed: ? ? ? ?Family Communication: no family at the bedside   ? ?Disposition: ?Status is: Inpatient ?Remains inpatient appropriate because: heart failure  ? Planned Discharge Destination: Home and Skilled nursing facility ? ?Author: ?Tawni Millers, MD ?09/18/2021 2:24 PM ? ?For on call review [email or web address].  ?

## 2021-09-18 NOTE — Progress Notes (Signed)
ANTICOAGULATION CONSULT NOTE  ? ?Pharmacy Consult for warfarin ?Indication: atrial fibrillation ? ?Allergies  ?Allergen Reactions  ? Fenofibrate Other (See Comments)  ?  Per MAR  ? ? ?Patient Measurements: ?Height: 5\' 7"  (170.2 cm) ?Weight: (!) 143.7 kg (316 lb 12.8 oz) ?IBW/kg (Calculated) : 66.1 ? ? ?Vital Signs: ?Temp: 97.3 ?F (36.3 ?C) (04/12 0732) ?Temp Source: Axillary (04/12 0732) ?BP: 111/67 (04/12 0732) ?Pulse Rate: 77 (04/12 0736) ? ?Labs: ?Recent Labs  ?  09/16/21 ?0423 09/16/21 ?[id number] 09/16/21 ?1048 09/17/21 ?[id number] 09/18/21 ?0431  ?HGB 12.9*  --  12.9*  --   --   ?HCT 40.2  --  38.0*  --   --   ?PLT 260  --   --   --   --   ?LABPROT 34.1*  --   --  36.6* 22.4*  ?INR 3.4*  --   --  3.7* 2.0*  ?CREATININE 0.66  --   --  0.65 0.57*  ?TROPONINIHS 35* 33*  --   --   --   ? ? ? ?Estimated Creatinine Clearance: 129.8 mL/min (A) (by C-G formula based on SCr of 0.57 mg/dL (L)). ? ? ?Medical History: ?Past Medical History:  ?Diagnosis Date  ? Acute on chronic congestive heart failure ([place])   ? Anemia, secondary   ? SECONDARY TO ACUTE BLOOD LOSS  ? Anxiety disorder   ? Bloody stool 08/29/2011  ? intermittent along with constipation.   ? CVA (cerebral vascular accident) ([place]) 2010  ? 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 Depression   ? DVT of lower extremity (deep venous thrombosis) ([place])   ? RIGHT LOWER; s/p IVC filter 7/12  ? Dysphagia   ? Hyperlipidemia   ? Hypertension   ? Lupus anticoagulant disorder ([place]) 1990  ? Morbid obesity ([place])   ? PFO (patent foramen ovale)   ? TEE 2/10: EF 60%, trivial AI, mild Ao root dilatation, mod PFO with R-L shunting, atrial septal aneurysm;   echo 7/12: EF 65-70%, grade 1 diast dysfxn, mild MR, LVOT showed severe obstruction  ? 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 Protein S deficiency ([place])   ? Rectus sheath hematoma 7/12  ? required reversal of anticoagulation and c/b DVT req. IVC filter  ? Seizure disorder ([place])   ? ? ?Assessment: ?James Villegas presenting with SOB on BiPAP, hx of  afib on warfarin PTA with last dose taken 4/9.  INR on admission slightly supratherapeutic at 3.4, H/H 12.9/38, plts wnl. Warfarin dose was held on 4/10 with new NPO status.  ? ?PTA dosing: 5mg  daily ? ?INR down to 2 today s/p doses held, back on cardiac diet ? ?Goal of Therapy:  ?INR 2-3 ?Monitor platelets by anticoagulation protocol: Yes ?  ?Plan:  ?Warfarin 5mg  PO x 1 today ?Daily INR, s/s bleeding ? ?Bertis Ruddy, PharmD ?Clinical Pharmacist ?ED Pharmacist Phone # [phone] ?09/18/2021 7:James AM ? ? ? ?

## 2021-09-18 NOTE — Plan of Care (Signed)
0

## 2021-09-18 NOTE — Progress Notes (Signed)
Pt resting well on nasal cannula not showing any signs of distress.  No BIPAP placed at this time. ?

## 2021-09-18 NOTE — Progress Notes (Signed)
Pt states he will only wear BIPAP tonight if its necessary.  I advised we will assess him at bedtime and determine then. ?

## 2021-09-18 NOTE — Progress Notes (Signed)
Pt removed from BIPAP and placed on 6L [place]. Sats are 94% and WOB WNL. RT will continue to monitor. ?

## 2021-09-19 DIAGNOSIS — I482 Chronic atrial fibrillation, unspecified: Secondary | ICD-10-CM | POA: Diagnosis not present

## 2021-09-19 DIAGNOSIS — J9621 Acute and chronic respiratory failure with hypoxia: Secondary | ICD-10-CM | POA: Diagnosis not present

## 2021-09-19 DIAGNOSIS — Z86718 Personal history of other venous thrombosis and embolism: Secondary | ICD-10-CM | POA: Diagnosis not present

## 2021-09-19 DIAGNOSIS — E871 Hypo-osmolality and hyponatremia: Secondary | ICD-10-CM | POA: Diagnosis not present

## 2021-09-19 LAB — PROTIME-INR
INR: 1.7 — ABNORMAL HIGH (ref 0.8–1.2)
Prothrombin Time: 19.4 seconds — ABNORMAL HIGH (ref 11.4–15.2)

## 2021-09-19 LAB — BASIC METABOLIC PANEL
Anion gap: 7 (ref 5–15)
BUN: 9 mg/dL (ref 8–23)
CO2: 36 mmol/L — ABNORMAL HIGH (ref 22–32)
Calcium: 8.5 mg/dL — ABNORMAL LOW (ref 8.9–10.3)
Chloride: 99 mmol/L (ref 98–111)
Creatinine, Ser: 0.62 mg/dL (ref 0.61–1.24)
GFR, Estimated: >60 mL/min (ref >60)
Glucose, Bld: 94 mg/dL (ref 70–99)
Potassium: 3.9 mmol/L (ref 3.5–5.1)
Sodium: 142 mmol/L (ref 135–145)

## 2021-09-19 LAB — CBC
HCT: 37.5 % — ABNORMAL LOW (ref 39.0–52.0)
Hemoglobin: 11.8 g/dL — ABNORMAL LOW (ref 13.0–17.0)
MCH: 30.9 pg (ref 26.0–34.0)
MCHC: 31.5 g/dL (ref 30.0–36.0)
MCV: 98.2 fL (ref 80.0–100.0)
Platelets: 227 10*3/uL (ref 150–400)
RBC: 3.82 MIL/uL — ABNORMAL LOW (ref 4.22–5.81)
RDW: 13.9 % (ref 11.5–15.5)
WBC: 8.9 10*3/uL (ref 4.0–10.5)
nRBC: 0 % (ref 0.0–0.2)

## 2021-09-19 LAB — GLUCOSE, CAPILLARY: Glucose-Capillary: 91 mg/dL (ref 70–99)

## 2021-09-19 LAB — MAGNESIUM: Magnesium: 2 mg/dL (ref 1.7–2.4)

## 2021-09-19 MED ORDER — WARFARIN SODIUM 7.5 MG PO TABS
7.5000 mg | ORAL_TABLET | Freq: Once | ORAL | Status: AC
  Administered 2021-09-19: 7.5 mg via ORAL
  Filled 2021-09-19: qty 1

## 2021-09-19 MED ORDER — EMPAGLIFLOZIN 10 MG PO TABS
10.0000 mg | ORAL_TABLET | Freq: Every day | ORAL | Status: DC
  Administered 2021-09-19 – 2021-09-21 (×3): 10 mg via ORAL
  Filled 2021-09-19 (×3): qty 1

## 2021-09-19 MED ORDER — POTASSIUM CHLORIDE CRYS ER 20 MEQ PO TBCR
40.0000 meq | EXTENDED_RELEASE_TABLET | Freq: Once | ORAL | Status: AC
  Administered 2021-09-19: 40 meq via ORAL
  Filled 2021-09-19: qty 2

## 2021-09-19 NOTE — Care Management Important Message (Signed)
Important Message ? ?Patient Details  ?Name: James Villegas ?MRN: [id number] ?Date of Birth: January 02, 1956 ? ? ?Medicare Important Message Given:  Yes ? ? ? ? ?Shelda Altes ?09/19/2021, 9:40 AM ?

## 2021-09-19 NOTE — Progress Notes (Signed)
?Progress Note ? ? ?Patient: James Villegas [id number] DOB: February 13, 1956 DOA: 09/16/2021     3 ?DOS: the patient was seen and examined on 09/19/2021 ?  ?Brief hospital course: ?Mr. Pirolli was admitted to the hospital with the working diagnosis of decompensated heart failure.  ? ?66 yo male with the past medical history of hypertension, dyslipidemia, atrial fibrillation, CVA with residual right hemiplegia, protein C and S deficiency and heart failure who presented with dyspnea. Patient was transferred to the ED from skill nursing facility due to respiratory distress. Limited history due to patient critically ill. He was placed on non invasive mechanical ventilation in the ED, because respiratory failure, his oxygenation was in the 80's on non rebreather mask, blood pressure 157/79, HR 101, RR 23, 02 saturation 94% on Bipap. Lungs with decreased ventilation, positive rales, heart with S1 and S2 present, irregularly irregular, with no gallops, abdomen protuberant and lower extremity with +++ pitting edema bilaterally.  ? ?ABG 7.22/ 109/ 146/ 45/ 99%.  ? ?Na 132, K 4.3 CL 85, bicarbonate 38, glucose 152, bun 8 cr 0,6 ?BNP 270 ?Wbc 9.9 hgb 12.9 plt 260 ? ?Chest radiograph with cardiomegaly bilateral interstitial infiltrates, with hilar vascular congestion, more dense infiltrates on the left lower and left upper lobe.  ? ?EKG 91 bpm, right axis deviation with right bundle branch block, atrial fibrillation with no significant ST segment or T wave changes.  ? ?Patient placed on diuresis.  ? ?Assessment and Plan: ?* Acute on chronic respiratory failure with hypoxia and hypercapnia (HCC) secondary to acute on chronic diastolic CHF exacerbation ?Echocardiogram with LV EF 70 to 75%, severe LV hypertrophy at the septum. Preserved RV systolic function. Moderate dilatation of the aortic root 45 mm.  ? ?Urine output over last 24 hrs is 3,000 ml. ?Systolic blood pressure [id number] to 112 mmHg.  ?Improved edema but not yet back to  baseline.  ? ?Continuee diuresis with 60 mg IV q12 hrs of furosemide. ?Continue with metoprolol and will add empagliflozin.  ?Close blood pressure monitoring.  ? ?  ? ?Atrial fibrillation, chronic (HCC) on chronic anticoagulation  protein S and C deficiency ?Continue rate control with metoprolol ?Anticoagulation with warfarin.  ?INR is 1.7   ? ?Hyponatremia ?Hypokalemia.  ? ?Renal function stable wit serum cr at 0,62, K is 3,9 and serum bicarbonate at 36. Mg is 2,0.  ? ?Plan to continue diuresis with furosemide.  ?Continue K supplementation with 40 meq and follow up renal function and electrolytes in am.  ? ?History of DVT (deep vein thrombosis) ?Protein S deficiency ?Continue anticoagulation with warfarin ?Is therapeutic, continue to follow with pharmacy protocol.  ? ?History of CVA with residual deficit ?Right sided hemiparesis, patient has been not ambulatory ?Plan to continue PT and OT ?Neuro checks per unit protocol and anticoagulation for atrial fibrillation.  ? ?Seizure disorder ([place]) ?Continue phenytoin ?No clinical signs of active seizures.  ? ?Hyperlipidemia ?Statin therapy with atorvastatin ? ?GERD (gastroesophageal reflux disease) ?Continue with pantoprazole.  ? ?Obesity, Class III, BMI 40-49.9 (morbid obesity) ([place]) ?BMI 45.85 kg/m? ? ? ? ? ?  ? ?Subjective: Patient is feeling better but not yet back to baseline, no chest pain or dyspnea.  ? ?Physical Exam: ?Vitals:  ? 09/19/21 0600 09/19/21 0745 09/19/21 0819 09/19/21 1000  ?BP: (!) 113/54 120/68  119/62  ?Pulse:  94  82  ?Resp:  18  18  ?Temp:  98 ?F (36.7 ?C)  98.3 ?F (36.8 ?C)  ?TempSrc:  Oral  Oral  ?  SpO2:  95% 95% 95%  ?Weight:      ?Height:      ? ?Neurology awake and alert, right hemiparesis  ?ENT with no pallor or icterus ?Cardiovascular with S1 and S2 present with no gallops or murmurs, no rubs ?Moderate JVD ?Positive lower extremity edema +++ bilaterally  ?Abdomen soft and not distended ? ?Data Reviewed: ?Family Communication: no family at  the bedside  ? ? ?Disposition: ?Status is: Inpatient ?Remains inpatient appropriate because: heart failure  ? Planned Discharge Destination: Skilled nursing facility ? ? ? ?Author: ?Tawni Millers, MD ?09/19/2021 2:07 PM ? ?For on call review [email or web address]  ?

## 2021-09-19 NOTE — Progress Notes (Signed)
ANTICOAGULATION CONSULT NOTE  ? ?Pharmacy Consult for warfarin ?Indication: atrial fibrillation ? ?Allergies  ?Allergen Reactions  ? Fenofibrate Other (See Comments)  ?  Per MAR  ? ? ?Patient Measurements: ?Height: 5\' 7"  (170.2 cm) ?Weight: (!) 139.4 kg (307 lb 5.1 oz) ?IBW/kg (Calculated) : 66.1 ? ? ?Vital Signs: ?Temp: 98 ?F (36.7 ?C) (04/13 0745) ?Temp Source: Oral (04/13 0745) ?BP: 120/68 (04/13 0745) ?Pulse Rate: 94 (04/13 0745) ? ?Labs: ?Recent Labs  ?  09/16/21 ?1048 09/17/21 ?[id number] 09/18/21 ?0431 09/19/21 ?0133  ?HGB 12.9*  --   --  11.8*  ?HCT 38.0*  --   --  37.5*  ?PLT  --   --   --  227  ?LABPROT  --  36.6* 22.4* 19.4*  ?INR  --  3.7* 2.0* 1.7*  ?CREATININE  --  0.65 0.57* 0.62  ? ? ? ?Estimated Creatinine Clearance: 127.5 mL/min (by C-G formula based on SCr of 0.62 mg/dL). ? ? ?Medical History: ?Past Medical History:  ?Diagnosis Date  ? Acute on chronic congestive heart failure ([place])   ? Anemia, secondary   ? SECONDARY TO ACUTE BLOOD LOSS  ? Anxiety disorder   ? Bloody stool 08/29/2011  ? intermittent along with constipation.   ? CVA (cerebral vascular accident) ([place]) 2010  ? 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 Depression   ? DVT of lower extremity (deep venous thrombosis) ([place])   ? RIGHT LOWER; s/p IVC filter 7/12  ? Dysphagia   ? Hyperlipidemia   ? Hypertension   ? Lupus anticoagulant disorder ([place]) 1990  ? Morbid obesity ([place])   ? PFO (patent foramen ovale)   ? TEE 2/10: EF 60%, trivial AI, mild Ao root dilatation, mod PFO with R-L shunting, atrial septal aneurysm;   echo 7/12: EF 65-70%, grade 1 diast dysfxn, mild MR, LVOT showed severe obstruction  ? 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 Protein S deficiency ([place])   ? Rectus sheath hematoma 7/12  ? required reversal of anticoagulation and c/b DVT req. IVC filter  ? Seizure disorder ([place])   ? ? ?Assessment: ?58 YOM presenting with SOB on BiPAP, hx of afib on warfarin PTA with last dose taken 4/9.  INR on admission slightly supratherapeutic at  3.4, H/H 11.8/37.5, plts wnl. Warfarin dose was held on 4/10 with new NPO status.  ? ?PTA dosing: 5mg  daily ? ?INR down to 1.7 today s/p doses held. Resumed dosing yesterday.  ? ?Goal of Therapy:  ?INR 2-3 ?Monitor platelets by anticoagulation protocol: Yes ?  ?Plan:  ?Warfarin 7.5mg  PO x 1 today ?Daily INR, s/s bleeding ? ?Erin Hearing PharmD., BCPS ?Clinical Pharmacist ?09/19/2021 10:26 AM ? ?

## 2021-09-19 NOTE — Progress Notes (Signed)
Pt states he does not want to wear the BiPAP tonight. He will call if he feels like he needs it. Will continue to monitor. ?

## 2021-09-20 DIAGNOSIS — J9621 Acute and chronic respiratory failure with hypoxia: Secondary | ICD-10-CM | POA: Diagnosis not present

## 2021-09-20 DIAGNOSIS — Z86718 Personal history of other venous thrombosis and embolism: Secondary | ICD-10-CM | POA: Diagnosis not present

## 2021-09-20 DIAGNOSIS — E871 Hypo-osmolality and hyponatremia: Secondary | ICD-10-CM | POA: Diagnosis not present

## 2021-09-20 DIAGNOSIS — I482 Chronic atrial fibrillation, unspecified: Secondary | ICD-10-CM | POA: Diagnosis not present

## 2021-09-20 LAB — BASIC METABOLIC PANEL
Anion gap: 4 — ABNORMAL LOW (ref 5–15)
BUN: 8 mg/dL (ref 8–23)
CO2: 41 mmol/L — ABNORMAL HIGH (ref 22–32)
Calcium: 8.6 mg/dL — ABNORMAL LOW (ref 8.9–10.3)
Chloride: 95 mmol/L — ABNORMAL LOW (ref 98–111)
Creatinine, Ser: 0.55 mg/dL — ABNORMAL LOW (ref 0.61–1.24)
GFR, Estimated: >60 mL/min (ref >60)
Glucose, Bld: 104 mg/dL — ABNORMAL HIGH (ref 70–99)
Potassium: 4.2 mmol/L (ref 3.5–5.1)
Sodium: 140 mmol/L (ref 135–145)

## 2021-09-20 LAB — PROTIME-INR
INR: 1.6 — ABNORMAL HIGH (ref 0.8–1.2)
Prothrombin Time: 18.8 seconds — ABNORMAL HIGH (ref 11.4–15.2)

## 2021-09-20 MED ORDER — SPIRONOLACTONE 25 MG PO TABS
25.0000 mg | ORAL_TABLET | Freq: Every day | ORAL | Status: DC
  Administered 2021-09-20 – 2021-09-21 (×2): 25 mg via ORAL
  Filled 2021-09-20 (×2): qty 1

## 2021-09-20 MED ORDER — TORSEMIDE 20 MG PO TABS
20.0000 mg | ORAL_TABLET | Freq: Two times a day (BID) | ORAL | Status: DC
  Administered 2021-09-20 – 2021-09-21 (×2): 20 mg via ORAL
  Filled 2021-09-20 (×2): qty 1

## 2021-09-20 MED ORDER — WARFARIN SODIUM 7.5 MG PO TABS
7.5000 mg | ORAL_TABLET | Freq: Once | ORAL | Status: AC
  Administered 2021-09-20: 7.5 mg via ORAL
  Filled 2021-09-20: qty 1

## 2021-09-20 NOTE — TOC Progression Note (Addendum)
Transition of Care (TOC) - Progression Note  ? ? ?Patient Details  ?Name: James Villegas ?MRN: [id number] ?Date of Birth: [birth year] ? ?Transition of Care (TOC) CM/SW Contact  ?Hermelinda Diegel Wynn Banker, LCSWA ?Phone Number: ?09/20/2021, 2:30 PM ? ?Clinical Narrative:    ?Pt is expected to DC over the weekend, pt is LTC at [facility]. CSW contacted Lacinda Axon to provide an update, no answer CSW left a VM.  ? ? ?Expected Discharge Plan: Long Term Nursing Home ?Barriers to Discharge: Continued Medical Work up ? ?Expected Discharge Plan and Services ?Expected Discharge Plan: Long Term Nursing Home ?  ?  ?  ?Living arrangements for the past 2 months: Skilled Nursing Facility ?                ?  ?  ?  ?  ?  ?  ?  ?  ?  ?  ? ? ?Social Determinants of Health (SDOH) Interventions ?  ? ?Readmission Risk Interventions ?   ? View : No data to display.  ?  ?  ?  ? ? ?

## 2021-09-20 NOTE — Progress Notes (Signed)
ANTICOAGULATION CONSULT NOTE  ? ?Pharmacy Consult for warfarin ?Indication: atrial fibrillation ? ?Allergies  ?Allergen Reactions  ? Fenofibrate Other (See Comments)  ?  Per MAR  ? ? ?Patient Measurements: ?Height: 5\' 7"  (170.2 cm) ?Weight: 133.1 kg (293 lb 6.9 oz) ?IBW/kg (Calculated) : 66.1 ? ? ?Vital Signs: ?Temp: 97.9 ?F (36.6 ?C) (04/14 0735) ?Temp Source: Oral (04/14 0735) ?BP: 130/79 (04/14 0735) ?Pulse Rate: 79 (04/14 0735) ? ?Labs: ?Recent Labs  ?  09/18/21 ?0431 09/19/21 ?0133 09/20/21 ?0352  ?HGB  --  11.8*  --   ?HCT  --  37.5*  --   ?PLT  --  227  --   ?LABPROT 22.4* 19.4* 18.8*  ?INR 2.0* 1.7* 1.6*  ?CREATININE 0.57* 0.62 0.55*  ? ? ? ?Estimated Creatinine Clearance: 124.2 mL/min (A) (by C-G formula based on SCr of 0.55 mg/dL (L)). ? ? ?Medical History: ?Past Medical History:  ?Diagnosis Date  ? Acute on chronic congestive heart failure ([place])   ? Anemia, secondary   ? SECONDARY TO ACUTE BLOOD LOSS  ? Anxiety disorder   ? Bloody stool 08/29/2011  ? intermittent along with constipation.   ? CVA (cerebral vascular accident) ([place]) 2010  ? 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 Depression   ? DVT of lower extremity (deep venous thrombosis) ([place])   ? RIGHT LOWER; s/p IVC filter 7/12  ? Dysphagia   ? Hyperlipidemia   ? Hypertension   ? Lupus anticoagulant disorder ([place]) 1990  ? Morbid obesity ([place])   ? PFO (patent foramen ovale)   ? TEE 2/10: EF 60%, trivial AI, mild Ao root dilatation, mod PFO with R-L shunting, atrial septal aneurysm;   echo 7/12: EF 65-70%, grade 1 diast dysfxn, mild MR, LVOT showed severe obstruction  ? 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 Protein S deficiency ([place])   ? Rectus sheath hematoma 7/12  ? required reversal of anticoagulation and c/b DVT req. IVC filter  ? Seizure disorder ([place])   ? ? ?Assessment: ?70 YOM presenting with SOB on BiPAP, hx of afib on warfarin PTA with last dose taken 4/9.  INR on admission slightly supratherapeutic at 3.4, H/H 11.8/37.5, plts wnl. Warfarin dose  was held on 4/10 with new NPO status.  ? ?PTA dosing: 5mg  daily ? ?INR down to 1.6 today s/p doses held. Resumed dosing 4/12.  ? ?Goal of Therapy:  ?INR 2-3 ?Monitor platelets by anticoagulation protocol: Yes ?  ?Plan:  ?Warfarin 7.5mg  PO x 1 again today ?Daily INR, s/s bleeding ? ?Erin Hearing PharmD., BCPS ?Clinical Pharmacist ?09/20/2021 10:17 AM ? ?

## 2021-09-20 NOTE — Progress Notes (Signed)
?Progress Note ? ? ?Patient: James Villegas [id number] DOB: 02-Oct-1955 DOA: 09/16/2021     4 ?DOS: the patient was seen and examined on 09/20/2021 ?  ?Brief hospital course: ?Mr. Murano was admitted to the hospital with the working diagnosis of decompensated heart failure.  ? ?66 yo male with the past medical history of hypertension, dyslipidemia, atrial fibrillation, CVA with residual right hemiplegia, protein C and S deficiency and heart failure who presented with dyspnea. Patient was transferred to the ED from skill nursing facility due to respiratory distress. Limited history due to patient critically ill. He was placed on non invasive mechanical ventilation in the ED, because respiratory failure, his oxygenation was in the 80's on non rebreather mask, blood pressure 157/79, HR 101, RR 23, 02 saturation 94% on Bipap. Lungs with decreased ventilation, positive rales, heart with S1 and S2 present, irregularly irregular, with no gallops, abdomen protuberant and lower extremity with +++ pitting edema bilaterally.  ? ?ABG 7.22/ 109/ 146/ 45/ 99%.  ? ?Na 132, K 4.3 CL 85, bicarbonate 38, glucose 152, bun 8 cr 0,6 ?BNP 270 ?Wbc 9.9 hgb 12.9 plt 260 ? ?Chest radiograph with cardiomegaly bilateral interstitial infiltrates, with hilar vascular congestion, more dense infiltrates on the left lower and left upper lobe.  ? ?EKG 91 bpm, right axis deviation with right bundle branch block, atrial fibrillation with no significant ST segment or T wave changes.  ? ?Patient placed on aggressive diuresis with improvement in his volume status.  ? ?Assessment and Plan: ?* Acute on chronic respiratory failure with hypoxia and hypercapnia (HCC) secondary to acute on chronic diastolic CHF exacerbation ?Echocardiogram with LV EF 70 to 75%, severe LV hypertrophy at the septum. Preserved RV systolic function. Moderate dilatation of the aortic root 45 mm.  ? ?Urine output over last 24 hrs is 3,050 ml. ?Systolic blood pressure 99991111 mmHg.   ?Continue to improve edema with a negative fluid balance since admission of -11.568 ml.  ? ?Plan to transition to oral diuretic therapy torsemide.  ?Continue with empagliflozin, metoprolol, and add spironolactone.  ?  ? ?Atrial fibrillation, chronic (HCC) on chronic anticoagulation  protein S and C deficiency ?On metoprolol for rate control.  ?Anticoagulation with warfarin.  ?INR is 1.6   ? ?Hyponatremia ?Hypokalemia.  ? ?Patient is responding well to diuresis, renal function with serum cr at 0,55 with K at 4,2 and serum bicarbonate at 41.  ?Continue diuresis with torsemide and spironolactone.  ? ? ? ?History of DVT (deep vein thrombosis) ?Protein S deficiency ?Continue anticoagulation with warfarin ?Is therapeutic, continue to follow with pharmacy protocol.  ? ?History of CVA with residual deficit ?Right sided hemiparesis, patient has been not ambulatory ?Plan to continue PT and OT ?Neuro checks per unit protocol and anticoagulation for atrial fibrillation.  ? ?Seizure disorder ([place]) ?Continue phenytoin ?No clinical signs of active seizures.  ? ?Hyperlipidemia ?Statin therapy with atorvastatin ? ?GERD (gastroesophageal reflux disease) ?Continue with pantoprazole.  ? ?Obesity, Class III, BMI 40-49.9 (morbid obesity) ([place]) ?BMI 45.85 kg/m? ? ? ? ? ?  ? ?Subjective: Patient is feeling better, dyspnea and edema are improving, no chest pain,  ? ?Physical Exam: ?Vitals:  ? 09/20/21 0000 09/20/21 0342 09/20/21 0735 09/20/21 1142  ?BP: (!) 98/56 94/60 130/79 109/72  ?Pulse: 85 (!) 127 79 95  ?Resp: 20 20 18 18   ?Temp: 98.5 ?F (36.9 ?C) 97.7 ?F (36.5 ?C) 97.9 ?F (36.6 ?C) 98 ?F (36.7 ?C)  ?TempSrc: Oral Oral Oral Oral  ?SpO2: 95%  94% 94% 98%  ?Weight:  133.1 kg    ?Height:      ? ?Neurology awake and alert, chronic right sided hemiparesis ?ENT with no pallor ?Cardiovascular with S1 and S2 present irregularly irregular with no gallops or murmurs ?Respiratory with no wheezing or rales ?Abdomen protuberant but not  distended ?Lower extremity edema ++ bilaterally  ?Data Reviewed: ? ? ? ?Family Communication: I spoke over the phone with the patient's sister about patient's  condition, plan of care, prognosis and all questions were addressed.  ? ?Disposition: ?Status is: Inpatient ?Remains inpatient appropriate because: heart failure  ? Planned Discharge Destination: Skilled nursing facility ? ? ? ?Author: ?Tawni Millers, MD ?09/20/2021 1:42 PM ? ?For on call review [email or web address].  ?

## 2021-09-20 NOTE — Discharge Instructions (Addendum)

## 2021-09-21 DIAGNOSIS — J9621 Acute and chronic respiratory failure with hypoxia: Secondary | ICD-10-CM | POA: Diagnosis not present

## 2021-09-21 DIAGNOSIS — J9622 Acute and chronic respiratory failure with hypercapnia: Secondary | ICD-10-CM | POA: Diagnosis not present

## 2021-09-21 LAB — BASIC METABOLIC PANEL
Anion gap: 5 (ref 5–15)
BUN: 6 mg/dL — ABNORMAL LOW (ref 8–23)
CO2: 41 mmol/L — ABNORMAL HIGH (ref 22–32)
Calcium: 8.6 mg/dL — ABNORMAL LOW (ref 8.9–10.3)
Chloride: 95 mmol/L — ABNORMAL LOW (ref 98–111)
Creatinine, Ser: 0.52 mg/dL — ABNORMAL LOW (ref 0.61–1.24)
GFR, Estimated: >60 mL/min (ref >60)
Glucose, Bld: 136 mg/dL — ABNORMAL HIGH (ref 70–99)
Potassium: 3.4 mmol/L — ABNORMAL LOW (ref 3.5–5.1)
Sodium: 141 mmol/L (ref 135–145)

## 2021-09-21 LAB — PROTIME-INR
INR: 2 — ABNORMAL HIGH (ref 0.8–1.2)
Prothrombin Time: 22.1 seconds — ABNORMAL HIGH (ref 11.4–15.2)

## 2021-09-21 LAB — MAGNESIUM: Magnesium: 2.1 mg/dL (ref 1.7–2.4)

## 2021-09-21 MED ORDER — SPIRONOLACTONE 25 MG PO TABS: 25.0000 mg | ORAL_TABLET | Freq: Every day | ORAL | 0 refills | Status: DC

## 2021-09-21 MED ORDER — POTASSIUM CHLORIDE CRYS ER 20 MEQ PO TBCR
40.0000 meq | EXTENDED_RELEASE_TABLET | Freq: Once | ORAL | Status: AC
  Administered 2021-09-21: 40 meq via ORAL
  Filled 2021-09-21: qty 2

## 2021-09-21 MED ORDER — EMPAGLIFLOZIN 10 MG PO TABS: 10.0000 mg | ORAL_TABLET | Freq: Every day | ORAL | 1 refills | Status: AC

## 2021-09-21 MED ORDER — WARFARIN SODIUM 5 MG PO TABS: 5.0000 mg | ORAL_TABLET | Freq: Once | ORAL | Status: DC

## 2021-09-21 NOTE — Progress Notes (Signed)
Report given to [facility] at [facility]. Patient waiting to be transported via [facility]. VM left to patient's sister [name], notifying her of patient's discharge today.  ?

## 2021-09-21 NOTE — Discharge Summary (Signed)
?Physician Discharge Summary ?  ?Patient: James Villegas MRN: [id number] DOB: [birth year]  ?Admit date:     09/16/2021  ?Discharge date: 09/21/21  ?Discharge Physician: Valeria Batman Uel Davidow  ? ?PCP: Raymondo Band, MD  ? ?Recommendations at discharge:  ? ?Follow-up with PCP and cardiologist in 1-2 weeks, ?Continue current medication as prescribed ?Continue monitoring daily weight, lower extremity edema, and symptoms such as increased in oxygen demand, shortness of breath ?Extra dose of Demadex 20 mg may be be given if >5 pounds gained in 24 hours or develop symptoms such as shortness of breath, increased O2 demand, edema ?Continue monitoring his electrolytes specifically potassium as he is on diuretics ?Monitor INR level, Coumadin may be adjusted to INR goal of 2-3 ? ? ?Discharge Diagnoses: ?Principal Problem: ?  Acute on chronic respiratory failure with hypoxia and hypercapnia (HCC) secondary to acute on chronic diastolic CHF exacerbation ?Active Problems: ?  Atrial fibrillation, chronic (HCC) on chronic anticoagulation  protein S and C deficiency ?  Hyponatremia ?  History of DVT (deep vein thrombosis) ?  History of CVA with residual deficit ?  Seizure disorder ([place]) ?  Hyperlipidemia ?  GERD (gastroesophageal reflux disease) ?  Obesity, Class III, BMI 40-49.9 (morbid obesity) ([place]) ? ?Resolved Problems: ?  * No resolved hospital problems. * ? ?Hospital Course: ?Mr. Rohs was admitted to the hospital with the working diagnosis of decompensated heart failure.  ? ?66 yo male with the past medical history of hypertension, dyslipidemia, atrial fibrillation, CVA with residual right hemiplegia, protein C and S deficiency and heart failure who presented with dyspnea. Patient was transferred to the ED from skill nursing facility due to respiratory distress. Limited history due to patient critically ill. He was placed on non invasive mechanical ventilation in the ED, because respiratory failure, his oxygenation was in the  80's on non rebreather mask, blood pressure 157/79, HR 101, RR 23, 02 saturation 94% on Bipap. Lungs with decreased ventilation, positive rales, heart with S1 and S2 present, irregularly irregular, with no gallops, abdomen protuberant and lower extremity with +++ pitting edema bilaterally.  ? ?ABG 7.22/ 109/ 146/ 45/ 99%.  ? ?Na 132, K 4.3 CL 85, bicarbonate 38, glucose 152, bun 8 cr 0,6 ?BNP 270 ?Wbc 9.9 hgb 12.9 plt 260 ? ?Chest radiograph with cardiomegaly bilateral interstitial infiltrates, with hilar vascular congestion, more dense infiltrates on the left lower and left upper lobe.  ? ?EKG 91 bpm, right axis deviation with right bundle branch block, atrial fibrillation with no significant ST segment or T wave changes.  ? ?Patient placed on aggressive diuresis with improvement in his volume status.  ? ?Assessment and Plan: ?* Acute on chronic respiratory failure with hypoxia and hypercapnia (HCC) secondary to acute on chronic diastolic CHF exacerbation ?Echocardiogram with LV EF 70 to 75%, severe LV hypertrophy at the septum. Preserved RV systolic function. Moderate dilatation of the aortic root 45 mm.  ? ?Good urine output appreciated ?Systolic blood pressure 99991111 mmHg.  ?Continue to improve edema with a negative fluid balance since admission of -11.568 ml.  ? ?Transitioning to p.o. torsemide ?-Spironolactone added ?Continue with empagliflozin, metoprolol, ?  ? ?Atrial fibrillation, chronic (HCC) on chronic anticoagulation  protein S and C deficiency ?On metoprolol for rate control.  ?Anticoagulation with warfarin.  ?INR is 1.6  >>2.0 today  ? ?Hyponatremia ?Hypokalemia.  ?Repleted orally ?Patient is responding well to diuresis, renal function with serum cr at 0,55 with K at 4,2 and serum bicarbonate at 41.  ?  Continue diuresis with torsemide and spironolactone.  ? ? ? ?History of DVT (deep vein thrombosis) ?Protein S deficiency ?Continue anticoagulation with warfarin ?Is therapeutic,  ? ?History of CVA with  residual deficit ?Right sided hemiparesis, patient has been not ambulatory ?Plan to continue PT and OT ?Continue Coumadin ? ?Seizure disorder ([place]) ?Continue phenytoin ?No clinical signs of active seizures.  ? ?Hyperlipidemia ?Statin therapy with atorvastatin ? ?GERD (gastroesophageal reflux disease) ?Continue with pantoprazole.  ? ?Obesity, Class III, BMI 40-49.9 (morbid obesity) ([place]) ?BMI 45.85 kg/m? ?Patient will benefit from aggressive weight loss ?May be evaluated by PCP and general surgery for gastric bypass ? ? ? ? ? ?Consultants: none  ?Procedures performed:  ?Disposition: Previous nursing home ?Diet recommendation:  ?Discharge Diet Orders (From admission, onward)  ? ?  Start     Ordered  ? 09/21/21 0000  Diet - low sodium heart healthy       ? 09/21/21 1149  ? ?  ?  ? ?  ? ?Cardiac diet ?DISCHARGE MEDICATION: ?Allergies as of 09/21/2021   ? ?   Reactions  ? Fenofibrate Other (See Comments)  ? Per MAR  ? ?  ? ?  ?Medication List  ?  ? ?TAKE these medications   ? ?acetaminophen 325 MG tablet ?Commonly known as: TYLENOL ?Take 650 mg by mouth every 6 (six) hours as needed for mild pain or headache. ?  ?cycloSPORINE 0.05 % ophthalmic emulsion ?Commonly known as: RESTASIS ?Place 1 drop into both eyes 2 (two) times daily. ?  ?empagliflozin 10 MG Tabs tablet ?Commonly known as: JARDIANCE ?Take 1 tablet (10 mg total) by mouth daily. ?Start taking on: September 22, 2021 ?  ?ezetimibe 10 MG tablet ?Commonly known as: ZETIA ?Take 10 mg by mouth daily. ?  ?fish oil-omega-3 fatty acids 1000 MG capsule ?Take 1,000 mg by mouth 2 (two) times daily with a meal. ?  ?metoprolol tartrate 25 MG tablet ?Commonly known as: LOPRESSOR ?Take 0.5 tablets (12.5 mg total) by mouth 2 (two) times daily. ?  ?omeprazole 20 MG capsule ?Commonly known as: PRILOSEC ?Take 20 mg by mouth daily. ?What changed: Another medication with the same name was removed. Continue taking this medication, and follow the directions you see here. ?  ?OXYGEN ?Inhale  into the lungs See admin instructions. Nightly 9p-8a ?  ?phenytoin 100 MG ER capsule ?Commonly known as: DILANTIN ?Take 200 mg by mouth 2 (two) times daily with a meal. ?  ?potassium chloride SA 20 MEQ tablet ?Commonly known as: KLOR-CON M ?Take 2 tablets (40 mEq total) by mouth daily. ?  ?Refresh Optive Advanced 0.5-1-0.5 % Soln ?Generic drug: Carboxymeth-Glycerin-Polysorb ?Place 1 drop into both eyes in the morning, at noon, in the evening, and at bedtime. Wait 3-5 minutes between eye medications ?  ?rosuvastatin 40 MG tablet ?Commonly known as: CRESTOR ?Take 40 mg by mouth at bedtime. ?  ?spironolactone 25 MG tablet ?Commonly known as: ALDACTONE ?Take 1 tablet (25 mg total) by mouth daily. ?Start taking on: September 22, 2021 ?  ?torsemide 20 MG tablet ?Commonly known as: DEMADEX ?Take 1 tablet (20 mg total) by mouth 2 (two) times daily. ?  ?traZODone 50 MG tablet ?Commonly known as: DESYREL ?Take 50 mg by mouth at bedtime. ?  ?venlafaxine XR 37.5 MG 24 hr capsule ?Commonly known as: EFFEXOR-XR ?Take 37.5 mg by mouth at bedtime. ?  ?warfarin 5 MG tablet ?Commonly known as: Coumadin ?Take as directed. If you are unsure how to take this medication, talk  to your nurse or doctor. ?Original instructions: Take 1 tablet (5 mg total) by mouth daily for 10 days. ?  ? ?  ? ?  ?  ? ? ?  ?Discharge Care Instructions  ?(From admission, onward)  ?  ? ? ?  ? ?  Start     Ordered  ? 09/21/21 0000  Discharge wound care:       ?Comments: Continue wound care per wound care RN instructions ?Right buttocks ?Alternate weight, and rotation in bed every 2 hours to avoid any pressure ulcers  ? 09/21/21 1149  ? ?  ?  ? ?  ? ? ?Discharge Exam: ?Filed Weights  ? 09/19/21 0400 09/20/21 0342 09/21/21 0400  ?Weight: (!) 139.4 kg 133.1 kg 133.8 kg  ? ? ? ? ?Physical Exam: ?  ?General:  AAO x 3,  cooperative, no distress; morbidly obese male  ?HEENT:  Normocephalic, PERRL, otherwise with in Normal limits   ?Neuro:  CNII-XII intact. , normal motor  and sensation, reflexes intact   ?Lungs:   Clear to auscultation BL, Respirations unlabored,  ?No wheezes / crackles  ?Cardio:    S1/S2, RRR, No murmure, No Rubs or Gallops   ?Abdomen:  Obese, soft, non-

## 2021-09-21 NOTE — TOC Transition Note (Signed)
Transition of Care (TOC) - CM/SW Discharge Note ? ? ?Patient Details  ?Name: James Villegas ?MRN: [id number] ?Date of Birth: 01/09/1956 ? ?Transition of Care (TOC) CM/SW Contact:  ?Patrice Paradise, LCSW ?Phone Number: [phone] ?09/21/2021, 1:39 PM ? ? ?Clinical Narrative:    ?Patient will DC to:?[facility] ?Anticipated DC date:?09/21/2021 ?Family notified:?susan ?Transport by: Sharin Mons ?  ?Per MD patient ready for DC to [facility] RN, patient, patient's family, and facility notified of DC. Discharge Summary sent to facility by fax and over the hub.. RN given number for report  [phone] ask for [employer].. DC packet on chart. Ambulance transport requested for patient.  ? ?CSW signing off. ?  ?Judd Lien, LCSW ?[phone]  ? ? ?Final next level of care: Long Term Acute Care (LTAC) ?Barriers to Discharge: Barriers Resolved ? ? ?Patient Goals and CMS Choice ?  ?  ?  ? ?Discharge Placement ?  ?           ?Patient chooses bed at: [facility] ?Patient to be transferred to facility by: [facility] ?Name of family member notified: susan ?Patient and family notified of of transfer: 09/21/21 ? ?Discharge Plan and Services ?  ?  ?           ?  ?  ?  ?  ?  ?  ?  ?  ?  ?  ? ?Social Determinants of Health (SDOH) Interventions ?  ? ? ?Readmission Risk Interventions ?   ? View : No data to display.  ?  ?  ?  ? ? ? ? ? ?

## 2021-09-21 NOTE — Progress Notes (Signed)
PT Cancellation Note ? ?Patient Details ?Name: James Villegas ?MRN: [id number] ?DOB: [birth year] ? ? ?Cancelled Treatment:    Reason Eval/Treat Not Completed: PT screened, no needs identified, will sign off. Per prior PT eval 09/17/21, pt TAx2 and at baseline. Pt is planning to d/c back to his LTC SNF. No further acute PT needs identified, will sign off. ? ?Raymond Gurney, PT, DPT ?Acute Rehabilitation Services  ?Pager: [phone] ?Office: [phone] ? ? ? ?[name] ?09/21/2021, 2:35 PM ? ? ?

## 2021-09-21 NOTE — Progress Notes (Signed)
ANTICOAGULATION CONSULT NOTE  ? ?Pharmacy Consult for warfarin ?Indication: atrial fibrillation ? ?Allergies  ?Allergen Reactions  ? Fenofibrate Other (See Comments)  ?  Per MAR  ? ? ?Patient Measurements: ?Height: 5\' 7"  (170.2 cm) ?Weight: 133.8 kg (294 lb 15.6 oz) ?IBW/kg (Calculated) : 66.1 ? ? ?Vital Signs: ?Temp: 98.1 ?F (36.7 ?C) (04/15 [id number]) ?Temp Source: Oral (04/15 [id number]) ?BP: 135/77 (04/15 0800) ?Pulse Rate: 91 (04/15 0722) ? ?Labs: ?Recent Labs  ?  09/19/21 ?0133 09/20/21 ?0352 09/21/21 ?0346  ?HGB 11.8*  --   --   ?HCT 37.5*  --   --   ?PLT 227  --   --   ?LABPROT 19.4* 18.8* 22.1*  ?INR 1.7* 1.6* 2.0*  ?CREATININE 0.62 0.55* 0.52*  ? ? ? ?Estimated Creatinine Clearance: 124.6 mL/min (A) (by C-G formula based on SCr of 0.52 mg/dL (L)). ? ? ?Medical History: ?Past Medical History:  ?Diagnosis Date  ? Acute on chronic congestive heart failure ([place])   ? Anemia, secondary   ? SECONDARY TO ACUTE BLOOD LOSS  ? Anxiety disorder   ? Bloody stool 08/29/2011  ? intermittent along with constipation.   ? CVA (cerebral vascular accident) ([place]) 2010  ? 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 Depression   ? DVT of lower extremity (deep venous thrombosis) ([place])   ? RIGHT LOWER; s/p IVC filter 7/12  ? Dysphagia   ? Hyperlipidemia   ? Hypertension   ? Lupus anticoagulant disorder ([place]) 1990  ? Morbid obesity ([place])   ? PFO (patent foramen ovale)   ? TEE 2/10: EF 60%, trivial AI, mild Ao root dilatation, mod PFO with R-L shunting, atrial septal aneurysm;   echo 7/12: EF 65-70%, grade 1 diast dysfxn, mild MR, LVOT showed severe obstruction  ? 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 Protein S deficiency ([place])   ? Rectus sheath hematoma 7/12  ? required reversal of anticoagulation and c/b DVT req. IVC filter  ? Seizure disorder ([place])   ? ? ?Assessment: ?35 YOM presenting with SOB on BiPAP, hx of afib on warfarin PTA with last dose taken 4/9.  INR on admission slightly supratherapeutic at 3.4, H/H 11.8/37.5, plts wnl. Warfarin  dose was held on 4/10 with new NPO status.  ? ?PTA dosing: 5mg  daily ? ?INR therapeutic at 2 today. CBC is stable ? ?Goal of Therapy:  ?INR 2-3 ?Monitor platelets by anticoagulation protocol: Yes ?  ?Plan:  ?Warfarin 5mg  PO x1 today ?Daily INR, s/s bleeding ? ?Thank you for including pharmacy in the care of this patient. ? ?Zenaida Deed, PharmD ?PGY1 Acute Care Pharmacy Resident  ?Phone: [phone] ?09/21/2021  10:15 AM ? ?Please check [email or web address] for unit-specific pharmacy phone numbers. ? ? ?

## 2021-09-23 DIAGNOSIS — E785 Hyperlipidemia, unspecified: Secondary | ICD-10-CM | POA: Diagnosis not present

## 2021-09-23 DIAGNOSIS — S82301D Unspecified fracture of lower end of right tibia, subsequent encounter for closed fracture with routine healing: Secondary | ICD-10-CM | POA: Diagnosis not present

## 2021-09-23 DIAGNOSIS — G40909 Epilepsy, unspecified, not intractable, without status epilepticus: Secondary | ICD-10-CM | POA: Diagnosis not present

## 2021-09-23 DIAGNOSIS — E662 Morbid (severe) obesity with alveolar hypoventilation: Secondary | ICD-10-CM | POA: Diagnosis not present

## 2021-09-23 DIAGNOSIS — I69351 Hemiplegia and hemiparesis following cerebral infarction affecting right dominant side: Secondary | ICD-10-CM | POA: Diagnosis not present

## 2021-09-23 DIAGNOSIS — R609 Edema, unspecified: Secondary | ICD-10-CM | POA: Diagnosis not present

## 2021-09-23 DIAGNOSIS — D6859 Other primary thrombophilia: Secondary | ICD-10-CM | POA: Diagnosis not present

## 2021-09-23 DIAGNOSIS — I4891 Unspecified atrial fibrillation: Secondary | ICD-10-CM | POA: Diagnosis not present

## 2021-09-23 DIAGNOSIS — J9620 Acute and chronic respiratory failure, unspecified whether with hypoxia or hypercapnia: Secondary | ICD-10-CM | POA: Diagnosis not present

## 2021-09-23 DIAGNOSIS — I5032 Chronic diastolic (congestive) heart failure: Secondary | ICD-10-CM | POA: Diagnosis not present

## 2021-09-23 DIAGNOSIS — G8191 Hemiplegia, unspecified affecting right dominant side: Secondary | ICD-10-CM | POA: Diagnosis not present

## 2021-09-23 DIAGNOSIS — M6281 Muscle weakness (generalized): Secondary | ICD-10-CM | POA: Diagnosis not present

## 2021-09-23 DIAGNOSIS — F39 Unspecified mood [affective] disorder: Secondary | ICD-10-CM | POA: Diagnosis not present

## 2021-09-23 DIAGNOSIS — I509 Heart failure, unspecified: Secondary | ICD-10-CM | POA: Diagnosis not present

## 2021-09-23 DIAGNOSIS — R293 Abnormal posture: Secondary | ICD-10-CM | POA: Diagnosis not present

## 2021-09-23 DIAGNOSIS — J9612 Chronic respiratory failure with hypercapnia: Secondary | ICD-10-CM | POA: Diagnosis not present

## 2021-09-23 DIAGNOSIS — I5033 Acute on chronic diastolic (congestive) heart failure: Secondary | ICD-10-CM | POA: Diagnosis not present

## 2021-09-24 DIAGNOSIS — I5033 Acute on chronic diastolic (congestive) heart failure: Secondary | ICD-10-CM | POA: Diagnosis not present

## 2021-09-24 DIAGNOSIS — I5020 Unspecified systolic (congestive) heart failure: Secondary | ICD-10-CM | POA: Diagnosis not present

## 2021-09-24 DIAGNOSIS — S82301D Unspecified fracture of lower end of right tibia, subsequent encounter for closed fracture with routine healing: Secondary | ICD-10-CM | POA: Diagnosis not present

## 2021-09-24 DIAGNOSIS — M6281 Muscle weakness (generalized): Secondary | ICD-10-CM | POA: Diagnosis not present

## 2021-09-24 DIAGNOSIS — Z20822 Contact with and (suspected) exposure to covid-19: Secondary | ICD-10-CM | POA: Diagnosis not present

## 2021-09-24 DIAGNOSIS — G8191 Hemiplegia, unspecified affecting right dominant side: Secondary | ICD-10-CM | POA: Diagnosis not present

## 2021-09-24 DIAGNOSIS — R293 Abnormal posture: Secondary | ICD-10-CM | POA: Diagnosis not present

## 2021-09-24 DIAGNOSIS — J9620 Acute and chronic respiratory failure, unspecified whether with hypoxia or hypercapnia: Secondary | ICD-10-CM | POA: Diagnosis not present

## 2021-09-25 DIAGNOSIS — M6281 Muscle weakness (generalized): Secondary | ICD-10-CM | POA: Diagnosis not present

## 2021-09-25 DIAGNOSIS — S82301D Unspecified fracture of lower end of right tibia, subsequent encounter for closed fracture with routine healing: Secondary | ICD-10-CM | POA: Diagnosis not present

## 2021-09-25 DIAGNOSIS — I5033 Acute on chronic diastolic (congestive) heart failure: Secondary | ICD-10-CM | POA: Diagnosis not present

## 2021-09-25 DIAGNOSIS — J9620 Acute and chronic respiratory failure, unspecified whether with hypoxia or hypercapnia: Secondary | ICD-10-CM | POA: Diagnosis not present

## 2021-09-25 DIAGNOSIS — R293 Abnormal posture: Secondary | ICD-10-CM | POA: Diagnosis not present

## 2021-09-25 DIAGNOSIS — G8191 Hemiplegia, unspecified affecting right dominant side: Secondary | ICD-10-CM | POA: Diagnosis not present

## 2021-09-26 DIAGNOSIS — R293 Abnormal posture: Secondary | ICD-10-CM | POA: Diagnosis not present

## 2021-09-26 DIAGNOSIS — S82301D Unspecified fracture of lower end of right tibia, subsequent encounter for closed fracture with routine healing: Secondary | ICD-10-CM | POA: Diagnosis not present

## 2021-09-26 DIAGNOSIS — G8191 Hemiplegia, unspecified affecting right dominant side: Secondary | ICD-10-CM | POA: Diagnosis not present

## 2021-09-26 DIAGNOSIS — M6281 Muscle weakness (generalized): Secondary | ICD-10-CM | POA: Diagnosis not present

## 2021-09-26 DIAGNOSIS — I5033 Acute on chronic diastolic (congestive) heart failure: Secondary | ICD-10-CM | POA: Diagnosis not present

## 2021-09-26 DIAGNOSIS — J9620 Acute and chronic respiratory failure, unspecified whether with hypoxia or hypercapnia: Secondary | ICD-10-CM | POA: Diagnosis not present

## 2021-09-27 DIAGNOSIS — J9620 Acute and chronic respiratory failure, unspecified whether with hypoxia or hypercapnia: Secondary | ICD-10-CM | POA: Diagnosis not present

## 2021-09-27 DIAGNOSIS — R293 Abnormal posture: Secondary | ICD-10-CM | POA: Diagnosis not present

## 2021-09-27 DIAGNOSIS — I5033 Acute on chronic diastolic (congestive) heart failure: Secondary | ICD-10-CM | POA: Diagnosis not present

## 2021-09-27 DIAGNOSIS — G8191 Hemiplegia, unspecified affecting right dominant side: Secondary | ICD-10-CM | POA: Diagnosis not present

## 2021-09-27 DIAGNOSIS — M6281 Muscle weakness (generalized): Secondary | ICD-10-CM | POA: Diagnosis not present

## 2021-09-27 DIAGNOSIS — Z79899 Other long term (current) drug therapy: Secondary | ICD-10-CM | POA: Diagnosis not present

## 2021-09-27 DIAGNOSIS — I5020 Unspecified systolic (congestive) heart failure: Secondary | ICD-10-CM | POA: Diagnosis not present

## 2021-09-27 DIAGNOSIS — S82301D Unspecified fracture of lower end of right tibia, subsequent encounter for closed fracture with routine healing: Secondary | ICD-10-CM | POA: Diagnosis not present

## 2021-09-30 DIAGNOSIS — E662 Morbid (severe) obesity with alveolar hypoventilation: Secondary | ICD-10-CM | POA: Diagnosis not present

## 2021-09-30 DIAGNOSIS — G8191 Hemiplegia, unspecified affecting right dominant side: Secondary | ICD-10-CM | POA: Diagnosis not present

## 2021-09-30 DIAGNOSIS — J9612 Chronic respiratory failure with hypercapnia: Secondary | ICD-10-CM | POA: Diagnosis not present

## 2021-09-30 DIAGNOSIS — M6281 Muscle weakness (generalized): Secondary | ICD-10-CM | POA: Diagnosis not present

## 2021-09-30 DIAGNOSIS — Z79899 Other long term (current) drug therapy: Secondary | ICD-10-CM | POA: Diagnosis not present

## 2021-09-30 DIAGNOSIS — S82301D Unspecified fracture of lower end of right tibia, subsequent encounter for closed fracture with routine healing: Secondary | ICD-10-CM | POA: Diagnosis not present

## 2021-09-30 DIAGNOSIS — G40909 Epilepsy, unspecified, not intractable, without status epilepticus: Secondary | ICD-10-CM | POA: Diagnosis not present

## 2021-09-30 DIAGNOSIS — R293 Abnormal posture: Secondary | ICD-10-CM | POA: Diagnosis not present

## 2021-09-30 DIAGNOSIS — J9620 Acute and chronic respiratory failure, unspecified whether with hypoxia or hypercapnia: Secondary | ICD-10-CM | POA: Diagnosis not present

## 2021-09-30 DIAGNOSIS — D7589 Other specified diseases of blood and blood-forming organs: Secondary | ICD-10-CM | POA: Diagnosis not present

## 2021-09-30 DIAGNOSIS — I5033 Acute on chronic diastolic (congestive) heart failure: Secondary | ICD-10-CM | POA: Diagnosis not present

## 2021-10-01 DIAGNOSIS — J9620 Acute and chronic respiratory failure, unspecified whether with hypoxia or hypercapnia: Secondary | ICD-10-CM | POA: Diagnosis not present

## 2021-10-01 DIAGNOSIS — S82301D Unspecified fracture of lower end of right tibia, subsequent encounter for closed fracture with routine healing: Secondary | ICD-10-CM | POA: Diagnosis not present

## 2021-10-01 DIAGNOSIS — G8191 Hemiplegia, unspecified affecting right dominant side: Secondary | ICD-10-CM | POA: Diagnosis not present

## 2021-10-01 DIAGNOSIS — I5033 Acute on chronic diastolic (congestive) heart failure: Secondary | ICD-10-CM | POA: Diagnosis not present

## 2021-10-01 DIAGNOSIS — M6281 Muscle weakness (generalized): Secondary | ICD-10-CM | POA: Diagnosis not present

## 2021-10-01 DIAGNOSIS — I5020 Unspecified systolic (congestive) heart failure: Secondary | ICD-10-CM | POA: Diagnosis not present

## 2021-10-01 DIAGNOSIS — R293 Abnormal posture: Secondary | ICD-10-CM | POA: Diagnosis not present

## 2021-10-01 DIAGNOSIS — F331 Major depressive disorder, recurrent, moderate: Secondary | ICD-10-CM | POA: Diagnosis not present

## 2021-10-02 DIAGNOSIS — I5033 Acute on chronic diastolic (congestive) heart failure: Secondary | ICD-10-CM | POA: Diagnosis not present

## 2021-10-02 DIAGNOSIS — J9620 Acute and chronic respiratory failure, unspecified whether with hypoxia or hypercapnia: Secondary | ICD-10-CM | POA: Diagnosis not present

## 2021-10-02 DIAGNOSIS — R293 Abnormal posture: Secondary | ICD-10-CM | POA: Diagnosis not present

## 2021-10-02 DIAGNOSIS — G8191 Hemiplegia, unspecified affecting right dominant side: Secondary | ICD-10-CM | POA: Diagnosis not present

## 2021-10-02 DIAGNOSIS — Z79899 Other long term (current) drug therapy: Secondary | ICD-10-CM | POA: Diagnosis not present

## 2021-10-02 DIAGNOSIS — Z7901 Long term (current) use of anticoagulants: Secondary | ICD-10-CM | POA: Diagnosis not present

## 2021-10-02 DIAGNOSIS — S82301D Unspecified fracture of lower end of right tibia, subsequent encounter for closed fracture with routine healing: Secondary | ICD-10-CM | POA: Diagnosis not present

## 2021-10-02 DIAGNOSIS — M6281 Muscle weakness (generalized): Secondary | ICD-10-CM | POA: Diagnosis not present

## 2021-10-03 DIAGNOSIS — S82301D Unspecified fracture of lower end of right tibia, subsequent encounter for closed fracture with routine healing: Secondary | ICD-10-CM | POA: Diagnosis not present

## 2021-10-03 DIAGNOSIS — M6281 Muscle weakness (generalized): Secondary | ICD-10-CM | POA: Diagnosis not present

## 2021-10-03 DIAGNOSIS — J9620 Acute and chronic respiratory failure, unspecified whether with hypoxia or hypercapnia: Secondary | ICD-10-CM | POA: Diagnosis not present

## 2021-10-03 DIAGNOSIS — R293 Abnormal posture: Secondary | ICD-10-CM | POA: Diagnosis not present

## 2021-10-03 DIAGNOSIS — G8191 Hemiplegia, unspecified affecting right dominant side: Secondary | ICD-10-CM | POA: Diagnosis not present

## 2021-10-03 DIAGNOSIS — I5033 Acute on chronic diastolic (congestive) heart failure: Secondary | ICD-10-CM | POA: Diagnosis not present

## 2021-10-04 DIAGNOSIS — I5033 Acute on chronic diastolic (congestive) heart failure: Secondary | ICD-10-CM | POA: Diagnosis not present

## 2021-10-04 DIAGNOSIS — S82301D Unspecified fracture of lower end of right tibia, subsequent encounter for closed fracture with routine healing: Secondary | ICD-10-CM | POA: Diagnosis not present

## 2021-10-04 DIAGNOSIS — I5020 Unspecified systolic (congestive) heart failure: Secondary | ICD-10-CM | POA: Diagnosis not present

## 2021-10-04 DIAGNOSIS — J9620 Acute and chronic respiratory failure, unspecified whether with hypoxia or hypercapnia: Secondary | ICD-10-CM | POA: Diagnosis not present

## 2021-10-04 DIAGNOSIS — G8191 Hemiplegia, unspecified affecting right dominant side: Secondary | ICD-10-CM | POA: Diagnosis not present

## 2021-10-04 DIAGNOSIS — R293 Abnormal posture: Secondary | ICD-10-CM | POA: Diagnosis not present

## 2021-10-04 DIAGNOSIS — M6281 Muscle weakness (generalized): Secondary | ICD-10-CM | POA: Diagnosis not present

## 2021-10-07 DIAGNOSIS — S82301D Unspecified fracture of lower end of right tibia, subsequent encounter for closed fracture with routine healing: Secondary | ICD-10-CM | POA: Diagnosis not present

## 2021-10-07 DIAGNOSIS — J9620 Acute and chronic respiratory failure, unspecified whether with hypoxia or hypercapnia: Secondary | ICD-10-CM | POA: Diagnosis not present

## 2021-10-07 DIAGNOSIS — I5033 Acute on chronic diastolic (congestive) heart failure: Secondary | ICD-10-CM | POA: Diagnosis not present

## 2021-10-07 DIAGNOSIS — M6281 Muscle weakness (generalized): Secondary | ICD-10-CM | POA: Diagnosis not present

## 2021-10-07 DIAGNOSIS — R293 Abnormal posture: Secondary | ICD-10-CM | POA: Diagnosis not present

## 2021-10-07 DIAGNOSIS — G8191 Hemiplegia, unspecified affecting right dominant side: Secondary | ICD-10-CM | POA: Diagnosis not present

## 2021-10-08 DIAGNOSIS — S82301D Unspecified fracture of lower end of right tibia, subsequent encounter for closed fracture with routine healing: Secondary | ICD-10-CM | POA: Diagnosis not present

## 2021-10-08 DIAGNOSIS — R293 Abnormal posture: Secondary | ICD-10-CM | POA: Diagnosis not present

## 2021-10-08 DIAGNOSIS — G8191 Hemiplegia, unspecified affecting right dominant side: Secondary | ICD-10-CM | POA: Diagnosis not present

## 2021-10-08 DIAGNOSIS — I5033 Acute on chronic diastolic (congestive) heart failure: Secondary | ICD-10-CM | POA: Diagnosis not present

## 2021-10-08 DIAGNOSIS — J9620 Acute and chronic respiratory failure, unspecified whether with hypoxia or hypercapnia: Secondary | ICD-10-CM | POA: Diagnosis not present

## 2021-10-08 DIAGNOSIS — M6281 Muscle weakness (generalized): Secondary | ICD-10-CM | POA: Diagnosis not present

## 2021-10-09 DIAGNOSIS — J9620 Acute and chronic respiratory failure, unspecified whether with hypoxia or hypercapnia: Secondary | ICD-10-CM | POA: Diagnosis not present

## 2021-10-09 DIAGNOSIS — I5033 Acute on chronic diastolic (congestive) heart failure: Secondary | ICD-10-CM | POA: Diagnosis not present

## 2021-10-09 DIAGNOSIS — G8191 Hemiplegia, unspecified affecting right dominant side: Secondary | ICD-10-CM | POA: Diagnosis not present

## 2021-10-09 DIAGNOSIS — R293 Abnormal posture: Secondary | ICD-10-CM | POA: Diagnosis not present

## 2021-10-09 DIAGNOSIS — S82301D Unspecified fracture of lower end of right tibia, subsequent encounter for closed fracture with routine healing: Secondary | ICD-10-CM | POA: Diagnosis not present

## 2021-10-09 DIAGNOSIS — M6281 Muscle weakness (generalized): Secondary | ICD-10-CM | POA: Diagnosis not present

## 2021-10-10 DIAGNOSIS — J9620 Acute and chronic respiratory failure, unspecified whether with hypoxia or hypercapnia: Secondary | ICD-10-CM | POA: Diagnosis not present

## 2021-10-10 DIAGNOSIS — G8191 Hemiplegia, unspecified affecting right dominant side: Secondary | ICD-10-CM | POA: Diagnosis not present

## 2021-10-10 DIAGNOSIS — S82301D Unspecified fracture of lower end of right tibia, subsequent encounter for closed fracture with routine healing: Secondary | ICD-10-CM | POA: Diagnosis not present

## 2021-10-10 DIAGNOSIS — I5033 Acute on chronic diastolic (congestive) heart failure: Secondary | ICD-10-CM | POA: Diagnosis not present

## 2021-10-10 DIAGNOSIS — R293 Abnormal posture: Secondary | ICD-10-CM | POA: Diagnosis not present

## 2021-10-10 DIAGNOSIS — M6281 Muscle weakness (generalized): Secondary | ICD-10-CM | POA: Diagnosis not present

## 2021-10-11 DIAGNOSIS — J9620 Acute and chronic respiratory failure, unspecified whether with hypoxia or hypercapnia: Secondary | ICD-10-CM | POA: Diagnosis not present

## 2021-10-11 DIAGNOSIS — S82301D Unspecified fracture of lower end of right tibia, subsequent encounter for closed fracture with routine healing: Secondary | ICD-10-CM | POA: Diagnosis not present

## 2021-10-11 DIAGNOSIS — M6281 Muscle weakness (generalized): Secondary | ICD-10-CM | POA: Diagnosis not present

## 2021-10-11 DIAGNOSIS — I5033 Acute on chronic diastolic (congestive) heart failure: Secondary | ICD-10-CM | POA: Diagnosis not present

## 2021-10-11 DIAGNOSIS — G8191 Hemiplegia, unspecified affecting right dominant side: Secondary | ICD-10-CM | POA: Diagnosis not present

## 2021-10-11 DIAGNOSIS — R293 Abnormal posture: Secondary | ICD-10-CM | POA: Diagnosis not present

## 2021-10-14 DIAGNOSIS — M6281 Muscle weakness (generalized): Secondary | ICD-10-CM | POA: Diagnosis not present

## 2021-10-14 DIAGNOSIS — R293 Abnormal posture: Secondary | ICD-10-CM | POA: Diagnosis not present

## 2021-10-14 DIAGNOSIS — S82301D Unspecified fracture of lower end of right tibia, subsequent encounter for closed fracture with routine healing: Secondary | ICD-10-CM | POA: Diagnosis not present

## 2021-10-14 DIAGNOSIS — G8191 Hemiplegia, unspecified affecting right dominant side: Secondary | ICD-10-CM | POA: Diagnosis not present

## 2021-10-14 DIAGNOSIS — I5033 Acute on chronic diastolic (congestive) heart failure: Secondary | ICD-10-CM | POA: Diagnosis not present

## 2021-10-14 DIAGNOSIS — J9620 Acute and chronic respiratory failure, unspecified whether with hypoxia or hypercapnia: Secondary | ICD-10-CM | POA: Diagnosis not present

## 2021-10-15 DIAGNOSIS — F331 Major depressive disorder, recurrent, moderate: Secondary | ICD-10-CM | POA: Diagnosis not present

## 2021-10-16 DIAGNOSIS — L723 Sebaceous cyst: Secondary | ICD-10-CM | POA: Diagnosis not present

## 2021-10-16 DIAGNOSIS — R791 Abnormal coagulation profile: Secondary | ICD-10-CM | POA: Diagnosis not present

## 2021-10-16 DIAGNOSIS — Z7901 Long term (current) use of anticoagulants: Secondary | ICD-10-CM | POA: Diagnosis not present

## 2021-10-16 DIAGNOSIS — D6859 Other primary thrombophilia: Secondary | ICD-10-CM | POA: Diagnosis not present

## 2021-10-18 ENCOUNTER — Non-Acute Institutional Stay: Payer: Medicare Other | Admitting: Hospice

## 2021-10-18 DIAGNOSIS — R0602 Shortness of breath: Secondary | ICD-10-CM

## 2021-10-18 DIAGNOSIS — Z515 Encounter for palliative care: Secondary | ICD-10-CM | POA: Diagnosis not present

## 2021-10-18 DIAGNOSIS — F339 Major depressive disorder, recurrent, unspecified: Secondary | ICD-10-CM

## 2021-10-18 DIAGNOSIS — I5032 Chronic diastolic (congestive) heart failure: Secondary | ICD-10-CM

## 2021-10-18 NOTE — Progress Notes (Signed)
? ? ?Manufacturing engineer ?Community Palliative Care Consult Note ?Telephone: [phone]  ?Fax: [phone] ? ?PATIENT NAME: James Villegas ?DOB: August 11, 1955 ?MRN: [id number] ? ?PRIMARY CARE PROVIDER:   Raymondo Band, MD ?James Band, MD ?[address] ?[address] ?[place],  [place] [address] ? ?REFERRING PROVIDER: Raymondo Band, MD ?James Band, MD ?[address] ?[address] ?[place],  [place] [address] ? ? ?RESPONSIBLE PARTY: Self/Sister - James Villegas [phone] ?Contact Information   ? ? Name Relation Home Work Mobile  ? James Villegas Sister [phone]  [phone]  ? ?  ? ? ?Visit is to build trust and highlight Palliative Medicine as specialized medical care for people living with serious illness, aimed at facilitating better quality of life through symptoms relief, assisting with advance care planning and complex medical decision making. This is a follow up visit. ? ?RECOMMENDATIONS/PLAN:  ? ? ?CODE STATUS: Patient is a Do Not Resuscitate ?  ?GOALS OF CARE: Goals of care include to maximize quality of life and symptom management.   ?MOST selections include comfort measures ?antibiotics if indicated, no IV fluids, no feeding tube.  Both signed DNR and MOST form uploaded to [facility] record epic. ? ?Visit consisted of counseling and education dealing with the complex and emotionally intense issues of symptom management and palliative care in the setting of serious and potentially life-threatening illness. Palliative care team will continue to support patient, patient's family, and medical team. ? ?Symptom management/Plan:  ?Shortness of breath: Recent hospitalization last month for respiratory distress related to diastolic congestive heart failure; treated and discharged to SNF for ongoing care.  Continue with continuous oxygen supplementation  3L/Min. Adhere to salt/fluid limits prevent fluid overload.  ?CHF:  Education provided on Fluid restriction 1537ml/24 hours, no  added salt, Elevate BLE. Continue Torsemide as ordered.  Patient continues to drink every day.  Patient will benefit Dietary consult ?Depression: Managed with Venlafaxin.  Encouraged socialization and participation in facility activities.  Psych consult as needed.  ?Follow up: Palliative care will continue to follow for complex medical decision making, advance care planning, and clarification of goals. Return 6 weeks or prn. Encouraged to call provider sooner with any concerns. ? ?CHIEF COMPLAINT: Palliative follow up ? ?HISTORY OF PRESENT ILLNESS:  James Villegas a 66 y.o. 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  Patient reports oxygen supplementation is helpful; not interested in respiratory distress.  Patient reports he continues to a can of soda every day and he says that is his comfort measure. History obtained from review of EMR, discussion with primary team, family and/or patient. Records reviewed and summarized above. All 10 point systems reviewed and are negative except as documented in history of present illness above ? ?Review and summarization of Epic records shows history from other than patient.  ? ?Palliative Care was asked to follow this patient o help address complex decision making in the context of advance care planning and goals of care clarification.  ? ?PHYSICAL EXAM  ?General: In no acute distress, appropriately dressed, morbidly obese ?Cardiovascular: regular rate and rhythm; non pitting edema to right lower ankle ?Pulmonary: no cough, no increased work of breathing, normal respiratory effort on 3 L of oxygen per minute ?Abdomen: soft, non tender, no guarding, positive bowel sounds in all quadrants ?GU:  no suprapubic  tenderness ?Eyes: Normal lids, no  discharge ?ENMT: Moist mucous membranes ?Musculoskeletal:  weakness, gets around with power scooter, non ambulatory ?Skin: no rash to visible skin, warm without cyanosis,  ?Psych: non-anxious affect ?Neurological: Weakness but otherwise non focal ?Heme/lymph/immuno: no bruises, no bleeding ? ?PERTINENT MEDICATIONS:  ?Outpatient Encounter Medications as of 10/18/2021  ?Medication Sig  ? acetaminophen (TYLENOL) 325 MG tablet Take 650 mg by mouth every 6 (six) hours as needed for mild pain or headache.  (Patient not taking: Reported on 09/16/2021)  ? Carboxymeth-Glycerin-Polysorb (REFRESH OPTIVE ADVANCED) 0.5-1-0.5 % SOLN Place 1 drop into both eyes in the morning, at noon, in the evening, and at bedtime. Wait 3-5 minutes between eye medications  ? cycloSPORINE (RESTASIS) 0.05 % ophthalmic emulsion Place 1 drop into both eyes 2 (two) times daily.  ? empagliflozin (JARDIANCE) 10 MG TABS tablet Take 1 tablet (10 mg total) by mouth daily.  ? ezetimibe (ZETIA) 10 MG tablet Take 10 mg by mouth daily.  ? fish oil-omega-3 fatty acids 1000 MG capsule Take 1,000 mg by mouth 2 (two) times daily with a meal.  ? metoprolol tartrate (LOPRESSOR) 25 MG tablet Take 0.5 tablets (12.5 mg total) by mouth 2 (two) times daily.  ? omeprazole (PRILOSEC) 20 MG capsule Take 20 mg by mouth daily.  ? OXYGEN Inhale into the lungs See admin instructions. Nightly 9p-8a  ? phenytoin (DILANTIN) 100 MG ER capsule Take 200 mg by mouth 2 (two) times daily with a meal.  ? potassium chloride SA (KLOR-CON) 20 MEQ tablet Take 2 tablets (40 mEq total) by mouth daily.  ? rosuvastatin (CRESTOR) 40 MG tablet Take 40 mg by mouth at bedtime.  ? spironolactone (ALDACTONE) 25 MG tablet Take 1 tablet (25 mg total) by mouth daily.  ? torsemide (DEMADEX) 20 MG tablet Take 1 tablet (20 mg total) by mouth 2 (two) times daily.  ? traZODone (DESYREL) 50 MG tablet Take 50 mg by mouth at bedtime.  ? venlafaxine XR (EFFEXOR-XR) 37.5 MG 24 hr capsule Take 37.5 mg by mouth at  bedtime.  ? warfarin (COUMADIN) 5 MG tablet Take 1 tablet (5 mg total) by mouth daily for 10 days.  ? ?No facility-administered encounter medications on file as of 10/18/2021.  ? ? ?HOSPICE ELIGIBILITY/DIAGNOSIS: TBD ? ?PAST MEDICAL HISTORY:  ?Past Medical History:  ?Diagnosis Date  ? Acute on chronic congestive heart failure ([place])   ? Anemia, secondary   ? SECONDARY TO ACUTE BLOOD LOSS  ? Anxiety disorder   ? Bloody stool 08/29/2011  ? intermittent along with constipation.   ? CVA (cerebral vascular accident) ([place]) 2010  ? 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 Depression   ? DVT of lower extremity (deep venous thrombosis) ([place])   ? RIGHT LOWER; s/p IVC filter 7/12  ? Dysphagia   ? Hyperlipidemia   ? Hypertension   ? Lupus anticoagulant disorder ([place]) 1990  ? Morbid obesity ([place])   ? PFO (patent foramen ovale)   ? TEE 2/10: EF 60%, trivial AI, mild Ao root dilatation, mod PFO with R-L shunting, atrial septal aneurysm;   echo 7/12: EF 65-70%, grade 1 diast dysfxn, mild MR, LVOT showed severe obstruction  ? 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 Protein S deficiency ([place])   ? Rectus sheath hematoma 7/12  ? required reversal of anticoagulation and c/b DVT req. IVC filter  ? Seizure disorder ([place])   ?  ? ?Review lab tests/diagnostics ?No results for input(s): WBC, HGB, HCT, PLT,  MCV in the last 168 hours. ?No results for input(s): NA, K, CL, CO2, BUN, CREATININE, GLUCOSE in the last 168 hours. ?CrCl cannot be calculated (Patient's most recent lab result is older than the maximum 21 days allowed.). ? ?ALLERGIES:  ?Allergies  ?Allergen Reactions  ? Fenofibrate Other (See Comments)  ?  Per MAR  ?   ? ?I spent 50 minutes providing this consultation; this includes time spent with patient/family, chart review and documentation. More than 50% of the time in this consultation was spent on counseling and coordinating communication  ? ?Thank you for the opportunity to participate in the care of PUNIT BELLAIRE Please call our  office at [phone] if we can be of additional assistance. ? ?Note: Portions of this note were generated with Lobbyist. Dictation errors may occur despite best attempts at proofreading. ? ?Elsie Amis

## 2021-10-28 DIAGNOSIS — F331 Major depressive disorder, recurrent, moderate: Secondary | ICD-10-CM | POA: Diagnosis not present

## 2021-11-19 DIAGNOSIS — F331 Major depressive disorder, recurrent, moderate: Secondary | ICD-10-CM | POA: Diagnosis not present

## 2021-11-19 DIAGNOSIS — I739 Peripheral vascular disease, unspecified: Secondary | ICD-10-CM | POA: Diagnosis not present

## 2021-11-19 DIAGNOSIS — L600 Ingrowing nail: Secondary | ICD-10-CM | POA: Diagnosis not present

## 2021-11-19 DIAGNOSIS — L603 Nail dystrophy: Secondary | ICD-10-CM | POA: Diagnosis not present

## 2021-12-02 DIAGNOSIS — E785 Hyperlipidemia, unspecified: Secondary | ICD-10-CM | POA: Diagnosis not present

## 2021-12-02 DIAGNOSIS — J9612 Chronic respiratory failure with hypercapnia: Secondary | ICD-10-CM | POA: Diagnosis not present

## 2021-12-02 DIAGNOSIS — I5032 Chronic diastolic (congestive) heart failure: Secondary | ICD-10-CM | POA: Diagnosis not present

## 2021-12-02 DIAGNOSIS — R609 Edema, unspecified: Secondary | ICD-10-CM | POA: Diagnosis not present

## 2021-12-02 DIAGNOSIS — I4891 Unspecified atrial fibrillation: Secondary | ICD-10-CM | POA: Diagnosis not present

## 2021-12-02 DIAGNOSIS — K632 Fistula of intestine: Secondary | ICD-10-CM | POA: Diagnosis not present

## 2021-12-02 DIAGNOSIS — I69351 Hemiplegia and hemiparesis following cerebral infarction affecting right dominant side: Secondary | ICD-10-CM | POA: Diagnosis not present

## 2021-12-02 DIAGNOSIS — E662 Morbid (severe) obesity with alveolar hypoventilation: Secondary | ICD-10-CM | POA: Diagnosis not present

## 2021-12-02 DIAGNOSIS — F39 Unspecified mood [affective] disorder: Secondary | ICD-10-CM | POA: Diagnosis not present

## 2021-12-02 DIAGNOSIS — G40909 Epilepsy, unspecified, not intractable, without status epilepticus: Secondary | ICD-10-CM | POA: Diagnosis not present

## 2021-12-02 DIAGNOSIS — D6859 Other primary thrombophilia: Secondary | ICD-10-CM | POA: Diagnosis not present

## 2021-12-02 DIAGNOSIS — I509 Heart failure, unspecified: Secondary | ICD-10-CM | POA: Diagnosis not present

## 2021-12-03 DIAGNOSIS — F331 Major depressive disorder, recurrent, moderate: Secondary | ICD-10-CM | POA: Diagnosis not present

## 2021-12-17 DIAGNOSIS — F331 Major depressive disorder, recurrent, moderate: Secondary | ICD-10-CM | POA: Diagnosis not present

## 2021-12-25 DIAGNOSIS — I69351 Hemiplegia and hemiparesis following cerebral infarction affecting right dominant side: Secondary | ICD-10-CM | POA: Diagnosis not present

## 2021-12-25 DIAGNOSIS — I5022 Chronic systolic (congestive) heart failure: Secondary | ICD-10-CM | POA: Diagnosis not present

## 2021-12-25 DIAGNOSIS — R293 Abnormal posture: Secondary | ICD-10-CM | POA: Diagnosis not present

## 2021-12-25 DIAGNOSIS — M24551 Contracture, right hip: Secondary | ICD-10-CM | POA: Diagnosis not present

## 2021-12-25 DIAGNOSIS — M24531 Contracture, right wrist: Secondary | ICD-10-CM | POA: Diagnosis not present

## 2021-12-26 DIAGNOSIS — I5022 Chronic systolic (congestive) heart failure: Secondary | ICD-10-CM | POA: Diagnosis not present

## 2021-12-26 DIAGNOSIS — R293 Abnormal posture: Secondary | ICD-10-CM | POA: Diagnosis not present

## 2021-12-26 DIAGNOSIS — M24531 Contracture, right wrist: Secondary | ICD-10-CM | POA: Diagnosis not present

## 2021-12-26 DIAGNOSIS — M24551 Contracture, right hip: Secondary | ICD-10-CM | POA: Diagnosis not present

## 2021-12-26 DIAGNOSIS — I69351 Hemiplegia and hemiparesis following cerebral infarction affecting right dominant side: Secondary | ICD-10-CM | POA: Diagnosis not present

## 2021-12-27 DIAGNOSIS — I69351 Hemiplegia and hemiparesis following cerebral infarction affecting right dominant side: Secondary | ICD-10-CM | POA: Diagnosis not present

## 2021-12-27 DIAGNOSIS — M24531 Contracture, right wrist: Secondary | ICD-10-CM | POA: Diagnosis not present

## 2021-12-27 DIAGNOSIS — R293 Abnormal posture: Secondary | ICD-10-CM | POA: Diagnosis not present

## 2021-12-27 DIAGNOSIS — I5022 Chronic systolic (congestive) heart failure: Secondary | ICD-10-CM | POA: Diagnosis not present

## 2021-12-27 DIAGNOSIS — M24551 Contracture, right hip: Secondary | ICD-10-CM | POA: Diagnosis not present

## 2021-12-30 DIAGNOSIS — I69351 Hemiplegia and hemiparesis following cerebral infarction affecting right dominant side: Secondary | ICD-10-CM | POA: Diagnosis not present

## 2021-12-30 DIAGNOSIS — R293 Abnormal posture: Secondary | ICD-10-CM | POA: Diagnosis not present

## 2021-12-30 DIAGNOSIS — M24551 Contracture, right hip: Secondary | ICD-10-CM | POA: Diagnosis not present

## 2021-12-30 DIAGNOSIS — M24531 Contracture, right wrist: Secondary | ICD-10-CM | POA: Diagnosis not present

## 2021-12-30 DIAGNOSIS — I5022 Chronic systolic (congestive) heart failure: Secondary | ICD-10-CM | POA: Diagnosis not present

## 2021-12-31 DIAGNOSIS — M24531 Contracture, right wrist: Secondary | ICD-10-CM | POA: Diagnosis not present

## 2021-12-31 DIAGNOSIS — R293 Abnormal posture: Secondary | ICD-10-CM | POA: Diagnosis not present

## 2021-12-31 DIAGNOSIS — I69351 Hemiplegia and hemiparesis following cerebral infarction affecting right dominant side: Secondary | ICD-10-CM | POA: Diagnosis not present

## 2021-12-31 DIAGNOSIS — M24551 Contracture, right hip: Secondary | ICD-10-CM | POA: Diagnosis not present

## 2021-12-31 DIAGNOSIS — I5022 Chronic systolic (congestive) heart failure: Secondary | ICD-10-CM | POA: Diagnosis not present

## 2022-01-01 DIAGNOSIS — I69351 Hemiplegia and hemiparesis following cerebral infarction affecting right dominant side: Secondary | ICD-10-CM | POA: Diagnosis not present

## 2022-01-01 DIAGNOSIS — M24531 Contracture, right wrist: Secondary | ICD-10-CM | POA: Diagnosis not present

## 2022-01-01 DIAGNOSIS — R293 Abnormal posture: Secondary | ICD-10-CM | POA: Diagnosis not present

## 2022-01-01 DIAGNOSIS — M24551 Contracture, right hip: Secondary | ICD-10-CM | POA: Diagnosis not present

## 2022-01-01 DIAGNOSIS — I5022 Chronic systolic (congestive) heart failure: Secondary | ICD-10-CM | POA: Diagnosis not present

## 2022-01-01 DIAGNOSIS — F331 Major depressive disorder, recurrent, moderate: Secondary | ICD-10-CM | POA: Diagnosis not present

## 2022-01-02 DIAGNOSIS — R293 Abnormal posture: Secondary | ICD-10-CM | POA: Diagnosis not present

## 2022-01-02 DIAGNOSIS — M24551 Contracture, right hip: Secondary | ICD-10-CM | POA: Diagnosis not present

## 2022-01-02 DIAGNOSIS — I5022 Chronic systolic (congestive) heart failure: Secondary | ICD-10-CM | POA: Diagnosis not present

## 2022-01-02 DIAGNOSIS — M24531 Contracture, right wrist: Secondary | ICD-10-CM | POA: Diagnosis not present

## 2022-01-02 DIAGNOSIS — I69351 Hemiplegia and hemiparesis following cerebral infarction affecting right dominant side: Secondary | ICD-10-CM | POA: Diagnosis not present

## 2022-01-03 DIAGNOSIS — M24531 Contracture, right wrist: Secondary | ICD-10-CM | POA: Diagnosis not present

## 2022-01-03 DIAGNOSIS — I5022 Chronic systolic (congestive) heart failure: Secondary | ICD-10-CM | POA: Diagnosis not present

## 2022-01-03 DIAGNOSIS — M24551 Contracture, right hip: Secondary | ICD-10-CM | POA: Diagnosis not present

## 2022-01-03 DIAGNOSIS — R001 Bradycardia, unspecified: Secondary | ICD-10-CM | POA: Diagnosis not present

## 2022-01-03 DIAGNOSIS — R293 Abnormal posture: Secondary | ICD-10-CM | POA: Diagnosis not present

## 2022-01-03 DIAGNOSIS — I5032 Chronic diastolic (congestive) heart failure: Secondary | ICD-10-CM | POA: Diagnosis not present

## 2022-01-03 DIAGNOSIS — I69351 Hemiplegia and hemiparesis following cerebral infarction affecting right dominant side: Secondary | ICD-10-CM | POA: Diagnosis not present

## 2022-01-03 DIAGNOSIS — R0989 Other specified symptoms and signs involving the circulatory and respiratory systems: Secondary | ICD-10-CM | POA: Diagnosis not present

## 2022-01-06 DIAGNOSIS — M24531 Contracture, right wrist: Secondary | ICD-10-CM | POA: Diagnosis not present

## 2022-01-06 DIAGNOSIS — R293 Abnormal posture: Secondary | ICD-10-CM | POA: Diagnosis not present

## 2022-01-06 DIAGNOSIS — I5032 Chronic diastolic (congestive) heart failure: Secondary | ICD-10-CM | POA: Diagnosis not present

## 2022-01-06 DIAGNOSIS — I5022 Chronic systolic (congestive) heart failure: Secondary | ICD-10-CM | POA: Diagnosis not present

## 2022-01-06 DIAGNOSIS — I69351 Hemiplegia and hemiparesis following cerebral infarction affecting right dominant side: Secondary | ICD-10-CM | POA: Diagnosis not present

## 2022-01-06 DIAGNOSIS — R001 Bradycardia, unspecified: Secondary | ICD-10-CM | POA: Diagnosis not present

## 2022-01-06 DIAGNOSIS — M24551 Contracture, right hip: Secondary | ICD-10-CM | POA: Diagnosis not present

## 2022-01-07 DIAGNOSIS — M24531 Contracture, right wrist: Secondary | ICD-10-CM | POA: Diagnosis not present

## 2022-01-07 DIAGNOSIS — I5022 Chronic systolic (congestive) heart failure: Secondary | ICD-10-CM | POA: Diagnosis not present

## 2022-01-07 DIAGNOSIS — I69351 Hemiplegia and hemiparesis following cerebral infarction affecting right dominant side: Secondary | ICD-10-CM | POA: Diagnosis not present

## 2022-01-07 DIAGNOSIS — M24551 Contracture, right hip: Secondary | ICD-10-CM | POA: Diagnosis not present

## 2022-01-07 DIAGNOSIS — R293 Abnormal posture: Secondary | ICD-10-CM | POA: Diagnosis not present

## 2022-01-08 DIAGNOSIS — M24531 Contracture, right wrist: Secondary | ICD-10-CM | POA: Diagnosis not present

## 2022-01-08 DIAGNOSIS — D649 Anemia, unspecified: Secondary | ICD-10-CM | POA: Diagnosis not present

## 2022-01-08 DIAGNOSIS — I5022 Chronic systolic (congestive) heart failure: Secondary | ICD-10-CM | POA: Diagnosis not present

## 2022-01-08 DIAGNOSIS — E785 Hyperlipidemia, unspecified: Secondary | ICD-10-CM | POA: Diagnosis not present

## 2022-01-08 DIAGNOSIS — M24551 Contracture, right hip: Secondary | ICD-10-CM | POA: Diagnosis not present

## 2022-01-08 DIAGNOSIS — R293 Abnormal posture: Secondary | ICD-10-CM | POA: Diagnosis not present

## 2022-01-08 DIAGNOSIS — I69351 Hemiplegia and hemiparesis following cerebral infarction affecting right dominant side: Secondary | ICD-10-CM | POA: Diagnosis not present

## 2022-01-08 DIAGNOSIS — I10 Essential (primary) hypertension: Secondary | ICD-10-CM | POA: Diagnosis not present

## 2022-01-13 DIAGNOSIS — I509 Heart failure, unspecified: Secondary | ICD-10-CM | POA: Diagnosis not present

## 2022-01-13 DIAGNOSIS — R001 Bradycardia, unspecified: Secondary | ICD-10-CM | POA: Diagnosis not present

## 2022-01-13 DIAGNOSIS — J9612 Chronic respiratory failure with hypercapnia: Secondary | ICD-10-CM | POA: Diagnosis not present

## 2022-01-13 DIAGNOSIS — I4891 Unspecified atrial fibrillation: Secondary | ICD-10-CM | POA: Diagnosis not present

## 2022-01-13 DIAGNOSIS — Z7901 Long term (current) use of anticoagulants: Secondary | ICD-10-CM | POA: Diagnosis not present

## 2022-01-14 DIAGNOSIS — F331 Major depressive disorder, recurrent, moderate: Secondary | ICD-10-CM | POA: Diagnosis not present

## 2022-01-20 DIAGNOSIS — M24551 Contracture, right hip: Secondary | ICD-10-CM | POA: Diagnosis not present

## 2022-01-20 DIAGNOSIS — M24531 Contracture, right wrist: Secondary | ICD-10-CM | POA: Diagnosis not present

## 2022-01-20 DIAGNOSIS — R293 Abnormal posture: Secondary | ICD-10-CM | POA: Diagnosis not present

## 2022-01-20 DIAGNOSIS — Z9981 Dependence on supplemental oxygen: Secondary | ICD-10-CM | POA: Diagnosis not present

## 2022-01-20 DIAGNOSIS — E662 Morbid (severe) obesity with alveolar hypoventilation: Secondary | ICD-10-CM | POA: Diagnosis not present

## 2022-01-20 DIAGNOSIS — R7981 Abnormal blood-gas level: Secondary | ICD-10-CM | POA: Diagnosis not present

## 2022-01-20 DIAGNOSIS — I5022 Chronic systolic (congestive) heart failure: Secondary | ICD-10-CM | POA: Diagnosis not present

## 2022-01-20 DIAGNOSIS — J9612 Chronic respiratory failure with hypercapnia: Secondary | ICD-10-CM | POA: Diagnosis not present

## 2022-01-20 DIAGNOSIS — D6859 Other primary thrombophilia: Secondary | ICD-10-CM | POA: Diagnosis not present

## 2022-01-20 DIAGNOSIS — I69351 Hemiplegia and hemiparesis following cerebral infarction affecting right dominant side: Secondary | ICD-10-CM | POA: Diagnosis not present

## 2022-01-21 DIAGNOSIS — M24551 Contracture, right hip: Secondary | ICD-10-CM | POA: Diagnosis not present

## 2022-01-21 DIAGNOSIS — R293 Abnormal posture: Secondary | ICD-10-CM | POA: Diagnosis not present

## 2022-01-21 DIAGNOSIS — I5022 Chronic systolic (congestive) heart failure: Secondary | ICD-10-CM | POA: Diagnosis not present

## 2022-01-21 DIAGNOSIS — M24531 Contracture, right wrist: Secondary | ICD-10-CM | POA: Diagnosis not present

## 2022-01-21 DIAGNOSIS — I69351 Hemiplegia and hemiparesis following cerebral infarction affecting right dominant side: Secondary | ICD-10-CM | POA: Diagnosis not present

## 2022-01-22 DIAGNOSIS — R293 Abnormal posture: Secondary | ICD-10-CM | POA: Diagnosis not present

## 2022-01-22 DIAGNOSIS — I5022 Chronic systolic (congestive) heart failure: Secondary | ICD-10-CM | POA: Diagnosis not present

## 2022-01-22 DIAGNOSIS — M24551 Contracture, right hip: Secondary | ICD-10-CM | POA: Diagnosis not present

## 2022-01-22 DIAGNOSIS — M24531 Contracture, right wrist: Secondary | ICD-10-CM | POA: Diagnosis not present

## 2022-01-22 DIAGNOSIS — I69351 Hemiplegia and hemiparesis following cerebral infarction affecting right dominant side: Secondary | ICD-10-CM | POA: Diagnosis not present

## 2022-01-23 ENCOUNTER — Encounter: Payer: Self-pay | Admitting: Physician Assistant

## 2022-01-23 ENCOUNTER — Ambulatory Visit (INDEPENDENT_AMBULATORY_CARE_PROVIDER_SITE_OTHER): Payer: Medicare Other | Admitting: Physician Assistant

## 2022-01-23 VITALS — BP 100/60 | HR 77 | Ht 73.0 in | Wt 302.0 lb

## 2022-01-23 DIAGNOSIS — M24551 Contracture, right hip: Secondary | ICD-10-CM | POA: Diagnosis not present

## 2022-01-23 DIAGNOSIS — R293 Abnormal posture: Secondary | ICD-10-CM | POA: Diagnosis not present

## 2022-01-23 DIAGNOSIS — I4821 Permanent atrial fibrillation: Secondary | ICD-10-CM

## 2022-01-23 DIAGNOSIS — R0689 Other abnormalities of breathing: Secondary | ICD-10-CM

## 2022-01-23 DIAGNOSIS — I5033 Acute on chronic diastolic (congestive) heart failure: Secondary | ICD-10-CM

## 2022-01-23 DIAGNOSIS — M24531 Contracture, right wrist: Secondary | ICD-10-CM | POA: Diagnosis not present

## 2022-01-23 DIAGNOSIS — I5022 Chronic systolic (congestive) heart failure: Secondary | ICD-10-CM | POA: Diagnosis not present

## 2022-01-23 DIAGNOSIS — I69351 Hemiplegia and hemiparesis following cerebral infarction affecting right dominant side: Secondary | ICD-10-CM | POA: Diagnosis not present

## 2022-01-23 IMAGING — US US ABDOMEN LIMITED
1 series · 13 of 25 positions shown · non-contrast
Comparison: CT abdomen pelvis 06/30/2013

CLINICAL DATA: Vomiting

EXAM:
ULTRASOUND ABDOMEN LIMITED RIGHT UPPER QUADRANT

[Series 1: us abdomen limited · 13 of 73 slices shown]
[im 1/73]
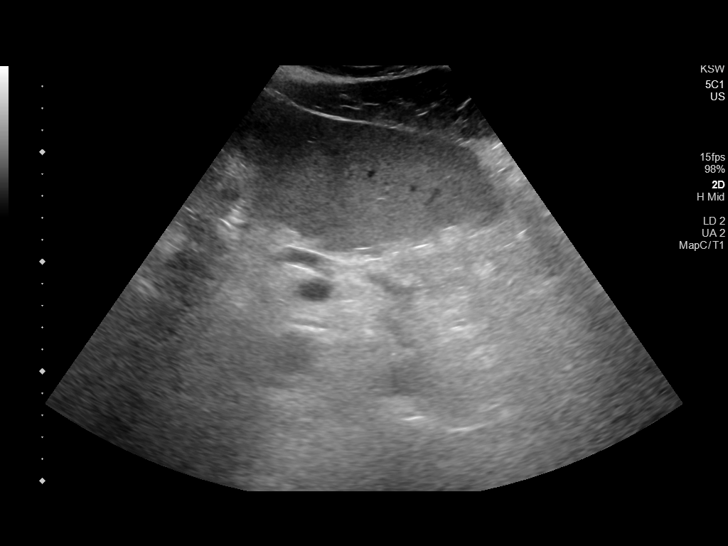
[im 7/73]
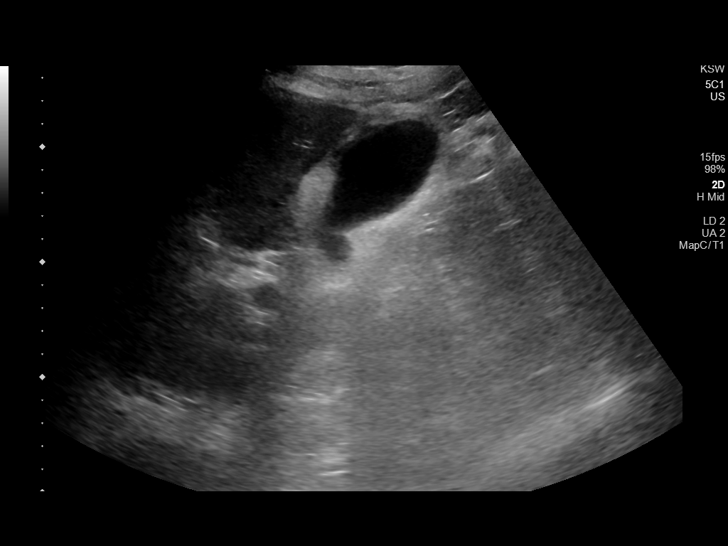
[im 13/73]
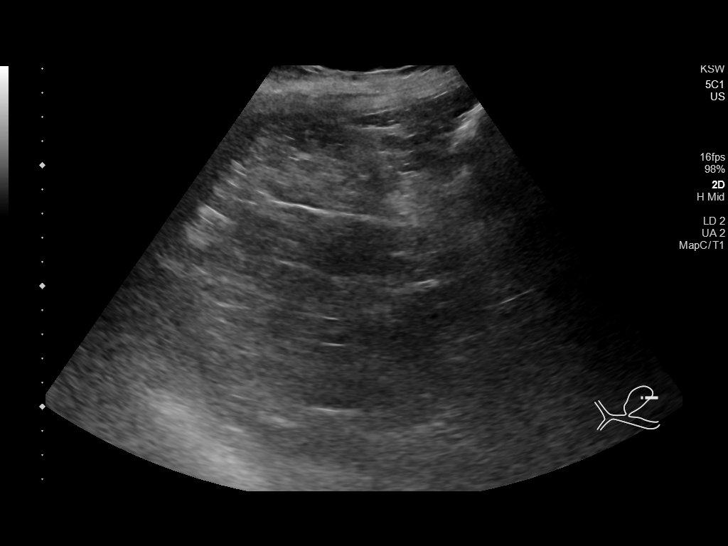
[im 19/73]
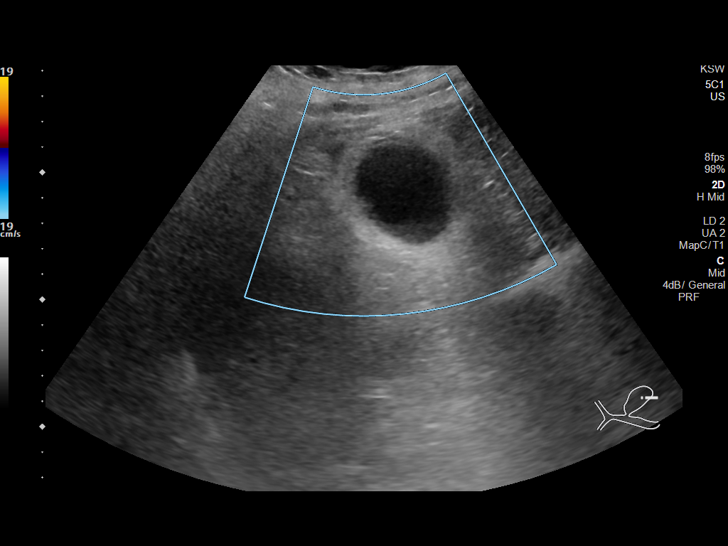
[im 25/73]
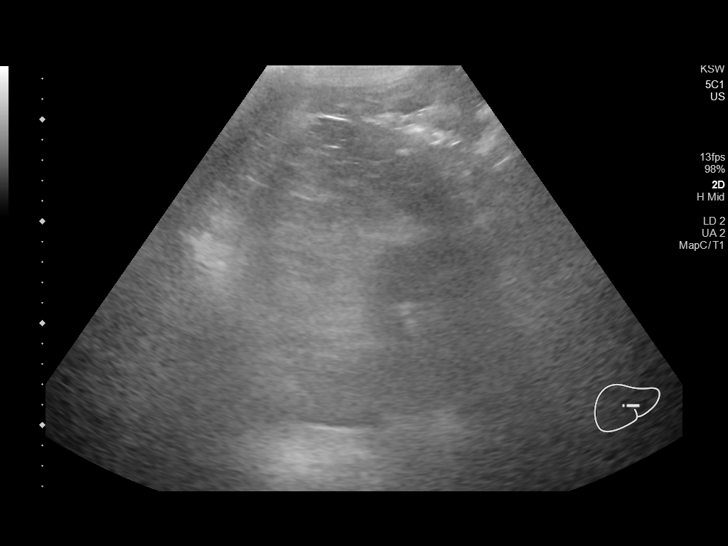
[im 31/73]
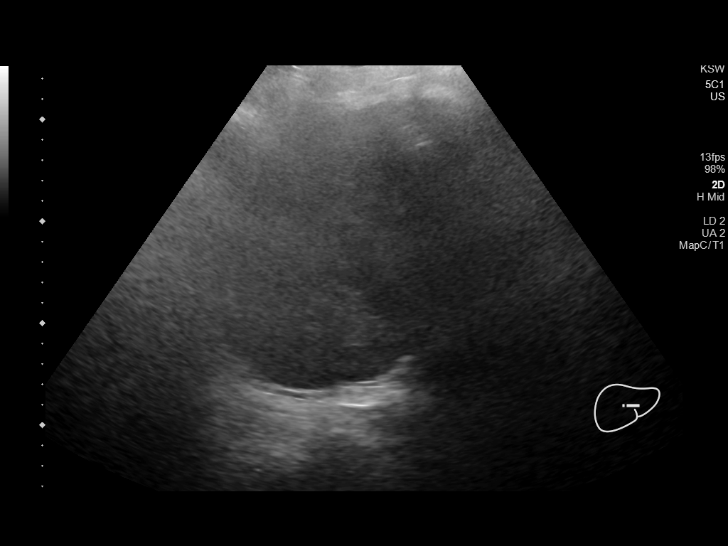
[im 37/73]
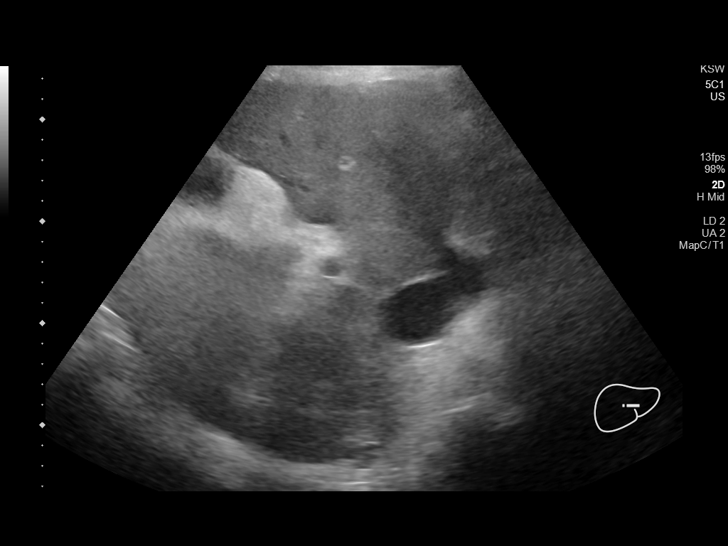
[im 43/73]
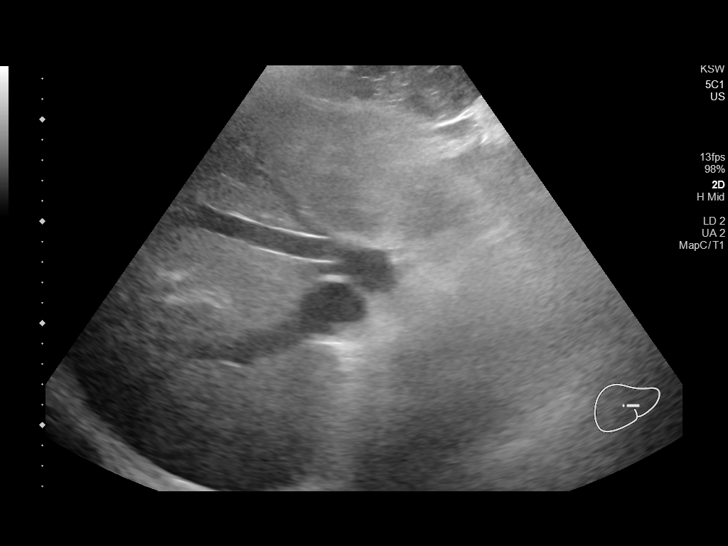
[im 49/73]
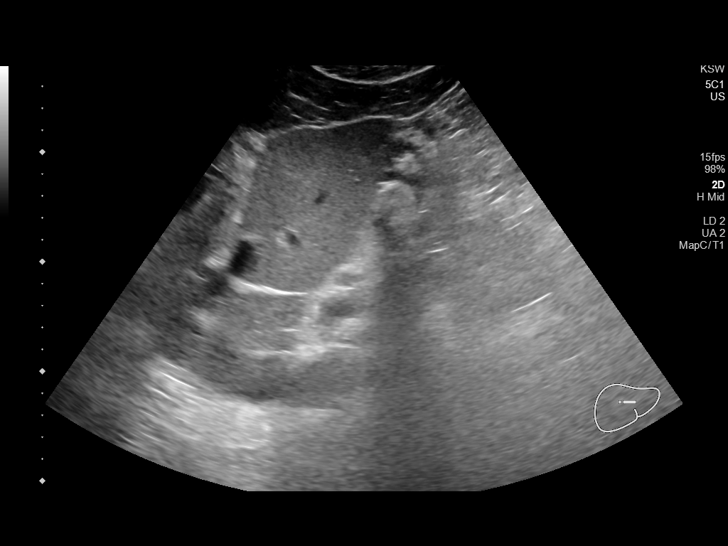
[im 55/73]
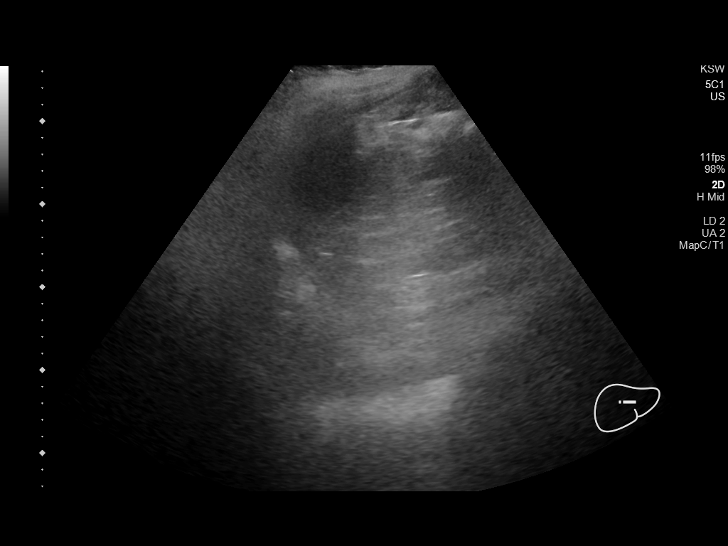
[im 61/73]
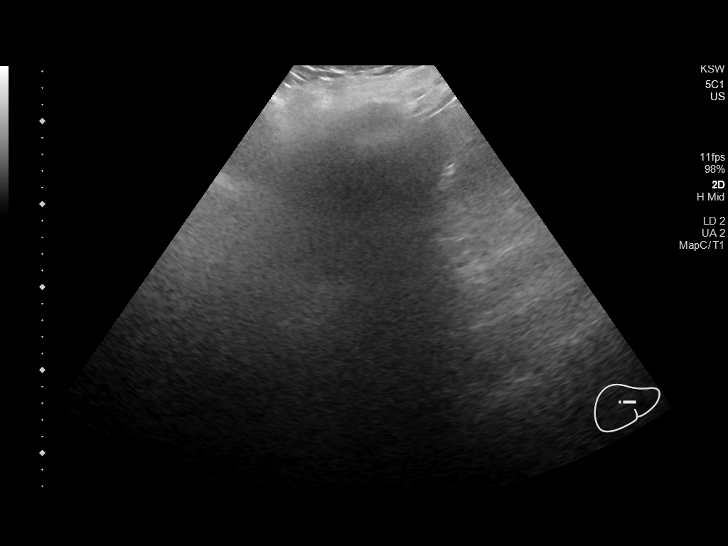
[im 67/73]
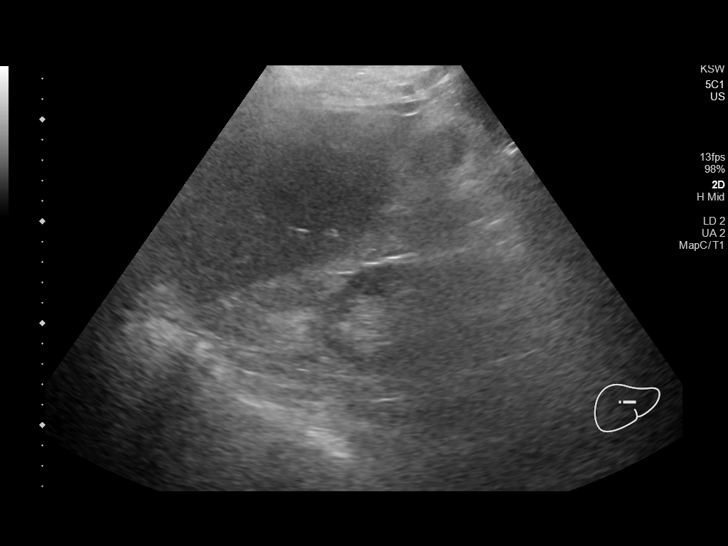
[im 73/73]
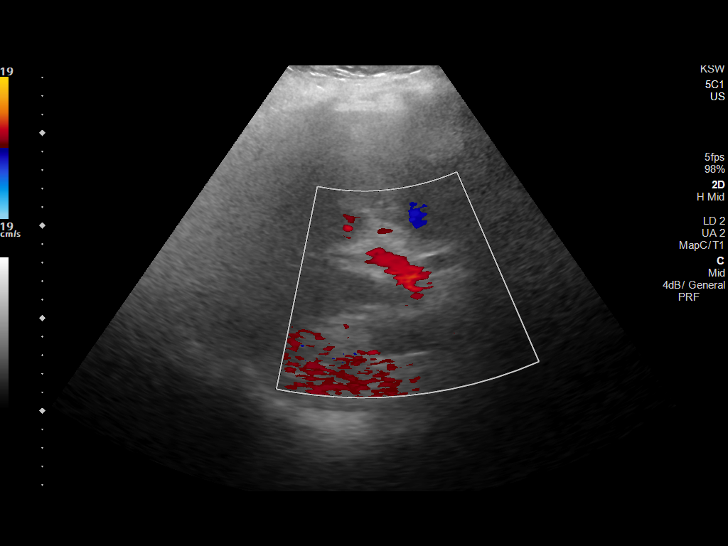

[13 of 25 positions shown; findings below may reference images not displayed]

FINDINGS: Gallbladder:

No gallstones within the gallbladder lumen. Gallbladder wall
thickening measuring up to 10 mm is identified. No pericholecystic
fluid. A negative sonographic Murphy sign was reported by
sonographer; however, please note that it is documented that patient
was not awake Moghal on BIPAP during the exam.

Common bile duct:

Diameter: 5 mm

Liver:

Normal in size. No focal lesion identified. Within normal limits in
parenchymal echogenicity. Portal vein is patent on color Doppler
imaging with normal direction of blood flow towards the liver.

Right kidney: Incompletely evaluated but measuring up to 11.8 cm in
length.

Pancreas not visualized.

Other: None.
IMPRESSION: Markedly limited right upper quadrant ultrasound due to patient's
mental status leading to difficulty in cooperation per the
sonographer notes.

Nonspecific marked gallbladder wall thickening up to 10 mm which can
be seen in chronic liver disease versus gallbladder disease. No
associated pericholecystic fluid or cholelithiasis identified.

Hepatic parenchyma grossly unremarkable.

## 2022-01-23 IMAGING — CT CT ANGIO CHEST
2 of 6 series · 18 of 36 positions shown · IV contrast (omnipaque)
Comparison: Chest CTA 09/13/2019.  Radiograph 02/20/2020.

CLINICAL DATA: Nausea and vomiting for 3 days. Found unresponsive
this morning with hypoxemia. High clinical suspicion for pulmonary
embolism.

EXAM:
CT ANGIOGRAPHY CHEST WITH CONTRAST
TECHNIQUE: Multidetector CT imaging of the chest was performed using the
standard protocol during bolus administration of intravenous
contrast. Multiplanar CT image reconstructions and MIPs were
obtained to evaluate the vascular anatomy.
CONTRAST:  100mL OMNIPAQUE IOHEXOL 350 MG/ML SOLN

[Series 5: thins · axial · 0.75mm/px · z∈[-249,+42]mm · 17 of 329 slices shown]
[im 19/329  lung]
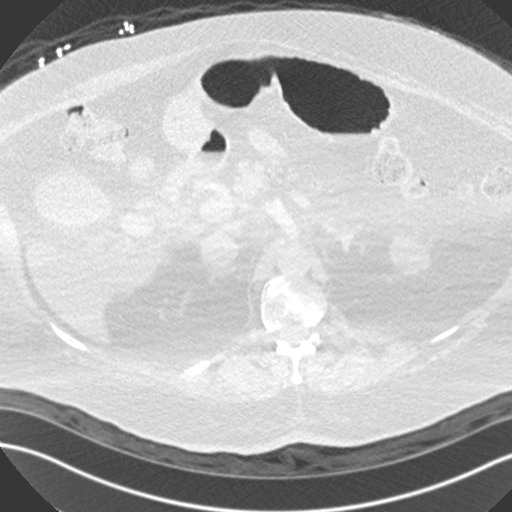
[im 37/329  mediastinal]
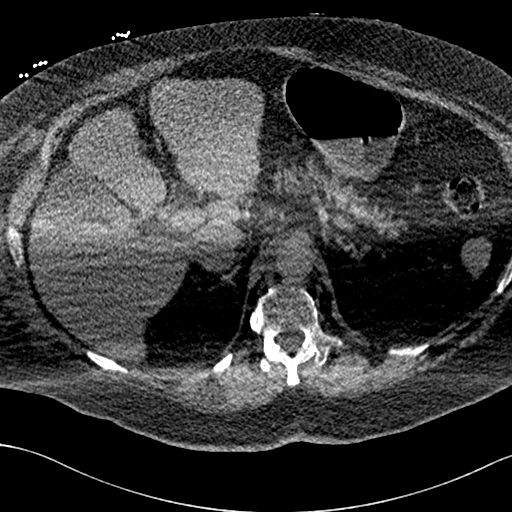
[im 55/329  lung]
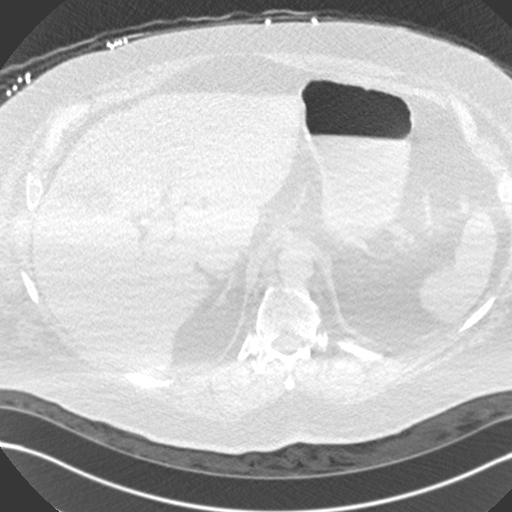
[im 73/329  mediastinal]
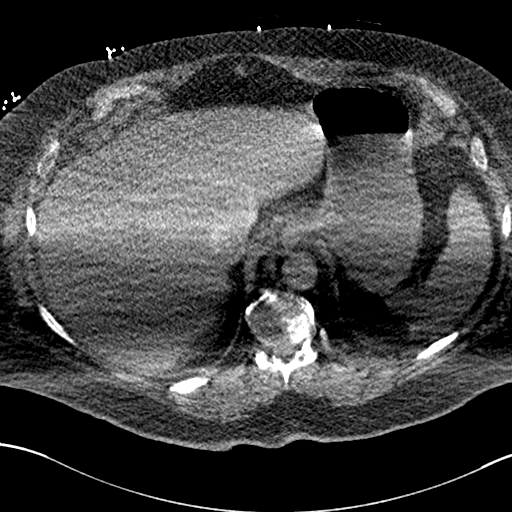
[im 92/329  lung]
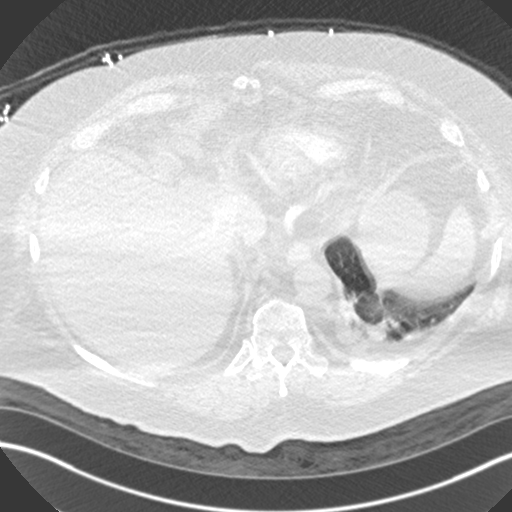
[im 110/329  mediastinal]
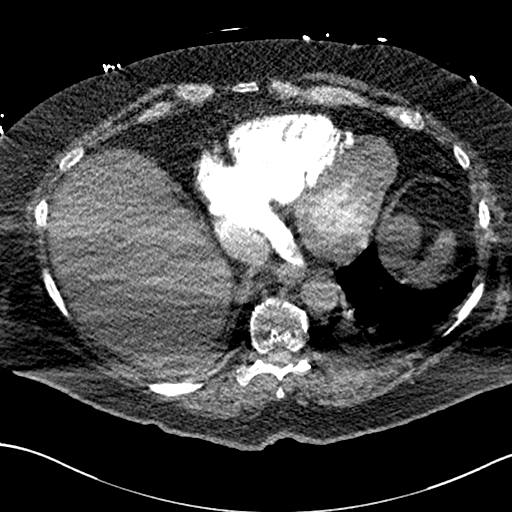
[im 128/329  lung]
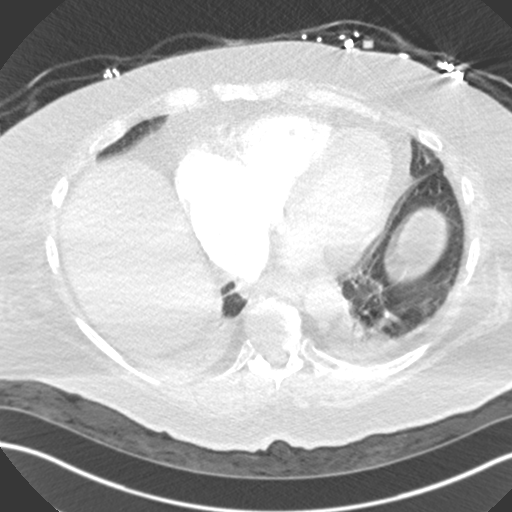
[im 146/329  mediastinal]
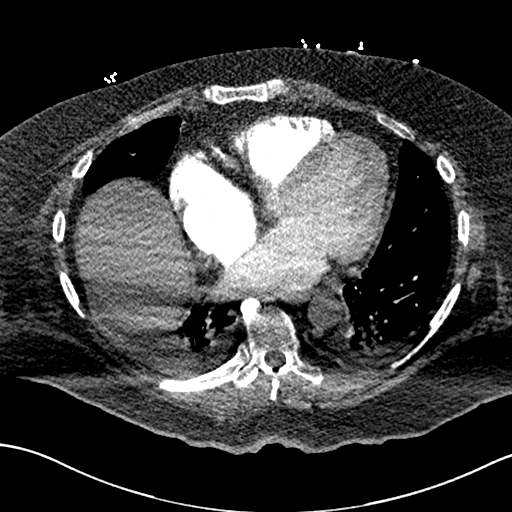
[im 165/329  lung]
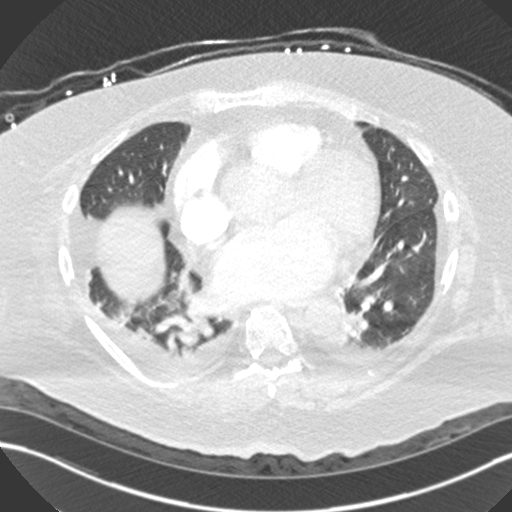
[im 183/329  mediastinal]
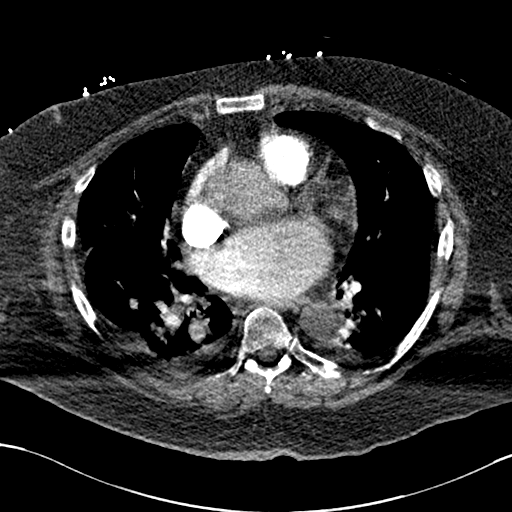
[im 201/329  lung]
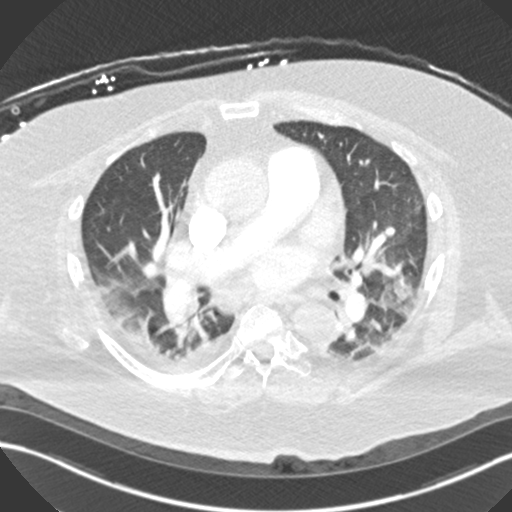
[im 219/329  mediastinal]
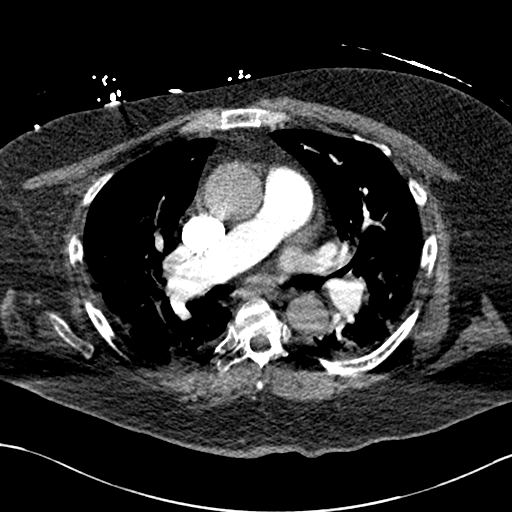
[im 237/329  lung]
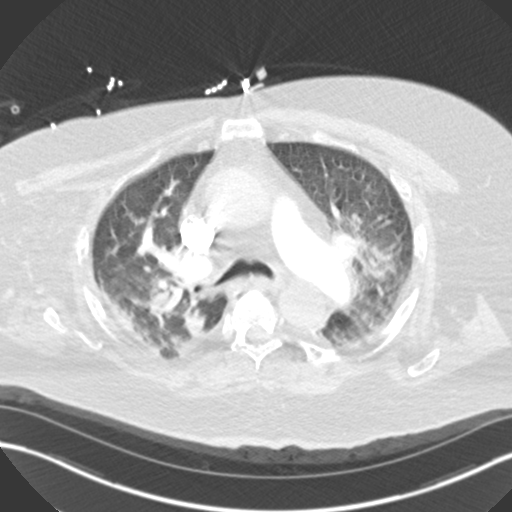
[im 256/329  mediastinal]
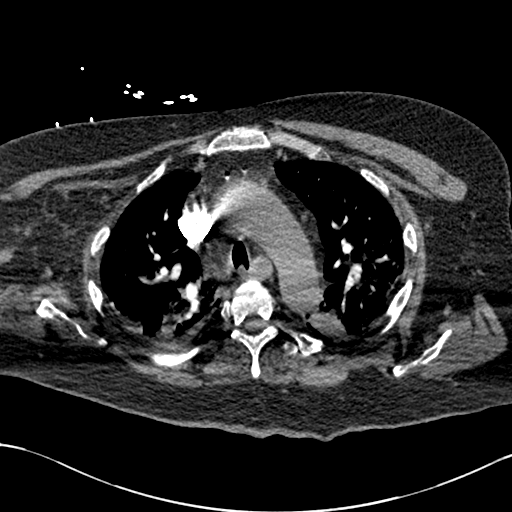
[im 274/329  lung]
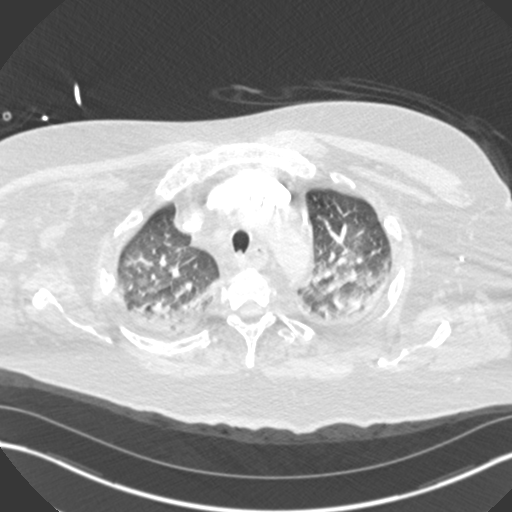
[im 292/329  mediastinal]
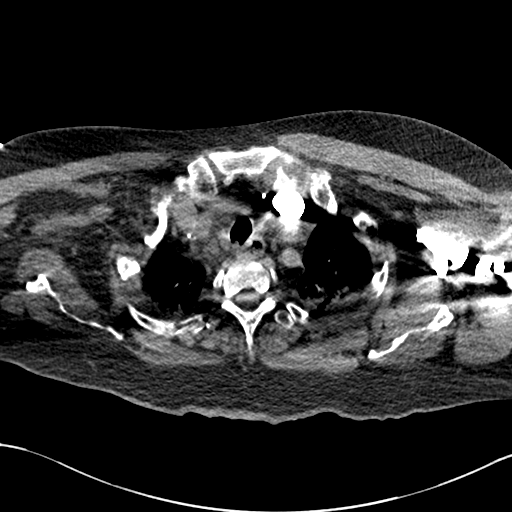
[im 310/329  lung]
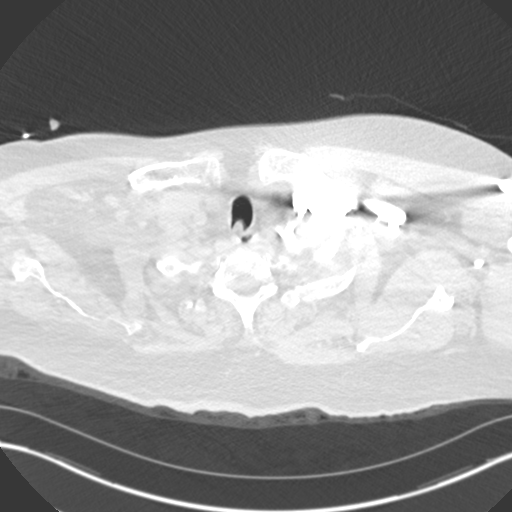

[Series 7: coronal mpr · coronal · 0.68mm/px · 1 of 151 slices shown]
[im 76/151  mediastinal]
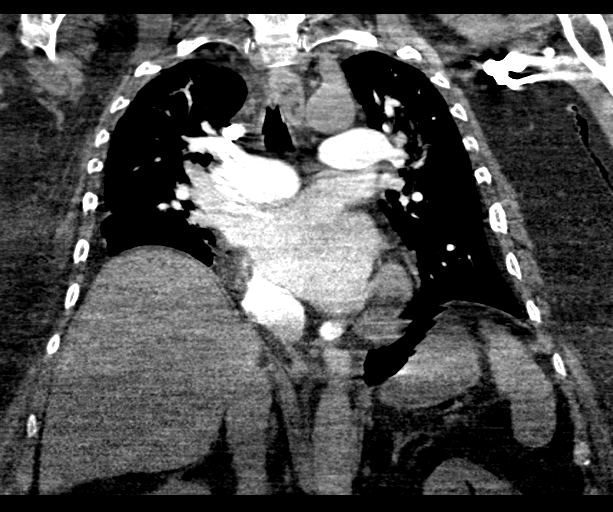

[18 of 36 positions shown; findings below may reference images not displayed]

FINDINGS: Cardiovascular: The pulmonary arteries are well opacified with
contrast to the level of the segmental branches. More peripheral
pulmonary arterial evaluation is limited by breathing artifact. No
central or segmental pulmonary emboli are identified. There is
limited opacification of the aorta which is grossly stable in
appearance. The ascending aorta measures up to 4.5 cm, similar to
previous study. The heart is mildly enlarged. There is no
pericardial effusion.

Mediastinum/Nodes: There are no enlarged mediastinal, hilar or
axillary lymph nodes. The thyroid gland, trachea and esophagus
demonstrate no significant findings.

Lungs/Pleura: New small dependent pleural effusions bilaterally with
associated dependent atelectasis in both lungs. In addition, there
are patchy ground-glass opacities within the aerated portions of
both lungs, demonstrating an upper lobe predominance. These
opacities may reflect multilobar pneumonia or edema. No
pneumothorax.

Upper abdomen: No acute findings are seen within the visualized
upper abdomen. Possible hepatic steatosis and changes of early
cirrhosis.

Musculoskeletal/Chest wall: There is no chest wall mass or
suspicious osseous finding.

Review of the MIP images confirms the above findings.
IMPRESSION: 1. No evidence of central or segmental pulmonary emboli. More
peripheral pulmonary arterial evaluation is limited by breathing
artifact. Consider correlation with lower extremity venous Doppler
ultrasound
2. New small dependent pleural effusions bilaterally with associated
dependent atelectasis in both lungs. Additional patchy ground-glass
opacities within the aerated portions of both lungs, as seen on
earlier radiographs, may reflect multilobar pneumonia or edema.
3. Stable mild dilatation of the ascending aorta.

## 2022-01-23 NOTE — Progress Notes (Signed)
Cardiology Office Note:    Date:  01/23/2022   ID:  James Villegas, DOB [birth year], MRN [id number]  PCP:  Raymondo Band, MD  [facility] Cardiologist:  Ena Dawley, MD >> will be Dr. Sydnee [facility]al [facility] Electrophysiologist:  None   Chief Complaint: 16 months follow up  History of Present Illness:    James Villegas is a 66 y.o. male with a hx of MCA CVA in 2010 with residual Rt heiparesis, wheelchair-bound nursing home resident, +PFO, spontaneous rectus sheath hematoma, underlying hypercoagulable disorder with Protein C and S def and + lupus anticoagulant on Coumadin, persistent atrial fibrillation, chronic diastolic heart failure, hypertension, chronic respiratory failure with hypoventilation syndrome, morbid obesity and ascending aortic aneurysm seen for hospital follow up.   He was lost to f/u for a period of time and was admitted to [facility] for CHF and new onset atrial fibrillation/flutter in 2020. He was seen by Dr. Meda Coffee.  Echocardiogram showed normal LV systolic function, mildly dilated LA. He was treated w/ IV diuretics and diuresed well.  Plan was for rate control strategy.  Was on Coumadin for anticoagulation.   Previously scheduled for sleep study but unable to complete at facility as he is required [facility] lift.  Referral made to [facility] for sleep study.  Admitted with hypoxic and hypercarbic respiratory failure in the setting of acute on chronic diastolic heart failure and obesity hypoventilation syndrome 07/2020. Diuresed well and discharged on Demadex 20mg  BID.   He was doing well on cardiac stand point when last seen by me on 08/2020.  Seen by Palliative care 10/18/21. He is DNR. Goals of care include to maximize quality of life and symptom management.    Here today for follow up from [facility] center.  Since seeing him last year, he has gained 10 pounds.  He is off metoprolol and reduce torsemide to 20 mg daily.  Has added Jardiance 10 mg.  He does Occupational  Therapy 1 hour each day.  Facility weighing him once a week.  He is on 4 L oxygen 24/7.  Has noted some lower extremity edema.  However able to lay flat.  No chest pain, palpitation or melena.    Past Medical History:  Diagnosis Date   Acute on chronic congestive heart failure (HCC)    Anemia, secondary    SECONDARY TO ACUTE BLOOD LOSS   Anxiety disorder    Bloody stool 08/29/2011   intermittent along with constipation.    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   Depression    DVT of lower extremity (deep venous thrombosis) ([place])    RIGHT LOWER; s/p IVC filter 7/12   Dysphagia    Hyperlipidemia    Hypertension    Lupus anticoagulant disorder ([place]) 1990   Morbid obesity (HCC)    PFO (patent foramen ovale)    TEE 2/10: EF 60%, trivial AI, mild Ao root dilatation, mod PFO with R-L shunting, atrial septal aneurysm;   echo 7/12: EF 65-70%, grade 1 diast dysfxn, mild MR, LVOT showed severe obstruction   Protein C deficiency (HCC)    Protein S deficiency (HCC)    Rectus sheath hematoma 7/12   required reversal of anticoagulation and c/b DVT req. IVC filter   Seizure disorder [facility])     Past Surgical History:  Procedure Laterality Date   dental extraction     multiple   vena cavogram  12/2010   INFERIOR  Current Medications: Current Meds  Medication Sig   acetaminophen (TYLENOL) 325 MG tablet Take 650 mg by mouth every 6 (six) hours as needed for mild pain or headache.   Carboxymeth-Glycerin-Polysorb (REFRESH OPTIVE ADVANCED) 0.5-1-0.5 % SOLN Place 1 drop into both eyes in the morning, at noon, in the evening, and at bedtime. Wait 3-5 minutes between eye medications   cycloSPORINE (RESTASIS) 0.05 % ophthalmic emulsion Place 1 drop into both eyes 2 (two) times daily.   empagliflozin (JARDIANCE) 10 MG TABS tablet Take by mouth daily.   ezetimibe (ZETIA) 10 MG tablet Take 10 mg by mouth daily.   fish oil-omega-3 fatty acids 1000  MG capsule Take 1,000 mg by mouth 2 (two) times daily with a meal.   omeprazole (PRILOSEC) 20 MG capsule Take 20 mg by mouth daily.   OXYGEN Inhale into the lungs See admin instructions. Nightly 9p-8a   phenytoin (DILANTIN) 100 MG ER capsule Take 200 mg by mouth 2 (two) times daily.   phenytoin (DILANTIN) 50 MG tablet Chew 50 mg by mouth daily.   potassium chloride SA (KLOR-CON) 20 MEQ tablet Take 2 tablets (40 mEq total) by mouth daily.   rosuvastatin (CRESTOR) 40 MG tablet Take 40 mg by mouth at bedtime.   spironolactone (ALDACTONE) 25 MG tablet Take 1 tablet (25 mg total) by mouth daily.   torsemide (DEMADEX) 20 MG tablet Take 20 mg by mouth every other day.   torsemide (DEMADEX) 20 MG tablet Take 20 mg by mouth 2 (two) times daily.   traZODone (DESYREL) 50 MG tablet Take 50 mg by mouth at bedtime.   venlafaxine XR (EFFEXOR-XR) 37.5 MG 24 hr capsule Take 37.5 mg by mouth at bedtime.   warfarin (COUMADIN) 1 MG tablet Take 0.5 mg by mouth. Tues/Thur/Sat/Sun   warfarin (COUMADIN) 4 MG tablet Take 4 mg by mouth. take 1 tab daily on Mon/Wed/Fri and tab4.5 on other days     Allergies:   Fenofibrate   Social History   Socioeconomic History   Marital status: Single    Spouse name: Not on file   Number of children: Not on file   Years of education: Not on file   Highest education level: Not on file  Occupational History   Not on file  Tobacco Use   Smoking status: Former    Packs/day: 1.00    Years: 15.00    Total pack years: 15.00    Types: Cigarettes, Cigars    Quit date: 2010    Years since quitting: 13.6   Smokeless tobacco: Never  Vaping Use   Vaping Use: Never used  Substance and Sexual Activity   Alcohol use: No    Comment: 10 years Sober    Drug use: No   Sexual activity: Not on file  Other Topics Concern   Not on file  Social History Narrative   Not on file   Social Determinants of Health   Financial Resource Strain: Not on file  Food Insecurity: Not on file   Transportation Needs: Not on file  Physical Activity: Not on file  Stress: Not on file  Social Connections: Not on file     Family History: The patient's family history includes Sleep apnea in his sister.    ROS:   Please see the history of present illness.    All other systems reviewed and are negative.   EKGs/Labs/Other Studies Reviewed:    The following studies were reviewed today:  Echo 07/11/20  1. Left ventricular ejection  fraction, by estimation, is 65 to 70%. The  left ventricle has normal function. The left ventricle has no regional  wall motion abnormalities. There is severe eccentric left ventricular  hypertrophy. Left ventricular diastolic  function could not be evaluated.   2. Right ventricular systolic function is normal. The right ventricular  size is normal. There is moderately elevated pulmonary artery systolic  pressure.   3. The mitral valve is normal in structure. Trivial mitral valve  regurgitation. No evidence of mitral stenosis.   4. The aortic valve is grossly normal. Aortic valve regurgitation is  trivial. No aortic stenosis is present.   5. Aortic dilatation noted. There is moderate dilatation of the ascending  aorta, measuring 47 mm.   6. The inferior vena cava is dilated in size with <50% respiratory  variability, suggesting right atrial pressure of 15 mmHg.   EKG:  EKG is not  ordered today.   Recent Labs: 09/16/2021: ALT 15; B Natriuretic Peptide 270.4 09/19/2021: Hemoglobin 11.8; Platelets 227 09/21/2021: BUN 6; Creatinine, Ser 0.52; Magnesium 2.1; Potassium 3.4; Sodium 141  Recent Lipid Panel    Component Value Date/Time   CHOL 207 (H) 07/15/2019 1055   TRIG 91 07/30/2020 1627   HDL 60 07/15/2019 1055   CHOLHDL 3.5 07/15/2019 1055   CHOLHDL 3.8 08/05/2018 0401   VLDL 27 08/05/2018 0401   LDLCALC 122 (H) 07/15/2019 1055    Physical Exam:    VS:  BP 100/60   Pulse 77   Ht 6\' 1"  (1.854 m)   Wt (!) 302 lb (137 kg)   SpO2 95%   BMI  39.84 kg/m     Wt Readings from Last 3 Encounters:  01/23/22 (!) 302 lb (137 kg)  09/21/21 294 lb 15.6 oz (133.8 kg)  08/29/20 293 lb (132.9 kg)     GEN:  Well nourished, well developed in no acute distress HEENT: Normal NECK: No JVD; No carotid bruits LYMPHATICS: No lymphadenopathy CARDIAC: IR IR , no murmurs, rubs, gallops RESPIRATORY: Faint basilar Rales ABDOMEN: Soft, non-tender, non-distended MUSCULOSKELETAL: Positive edema; No deformity  SKIN: Warm and dry NEUROLOGIC:  Alert and oriented x 3, R sided hemiparesis PSYCHIATRIC:  Normal affect   ASSESSMENT AND PLAN:   Acute on chronic combined CHF -10 pound weight gain since last office visit.  Has lower extremity edema.  No orthopnea.  Discussed with power of attorney over phone.  Worried about swelling in his leg.  Has crackles on exam.  Will increase torsemide to 20 mg twice daily for 3 days and then daily.  Recommending assessing  at least 2-3 times per week.  May need additional Lasix once a week if weighing him is challenging at facility.  Continue spironolactone and Jardiance. Low salt diet.    2.  Permanent atrial fibrillation Rate stable.  No bleeding issue.  Continue Coumadin.   3.  Obstructive sleep apnea 4.  Obesity hypoventilation syndrome Currently on 4 L oxygen 24/7.  Unable to get sleep study done due to challenge.   Medication Adjustments/Labs and Tests Ordered: Current medicines are reviewed at length with the patient today.  Concerns regarding medicines are outlined above.  No orders of the defined types were placed in this encounter.  No orders of the defined types were placed in this encounter.   Patient Instructions  Medication Instructions:  Your physician has recommended you make the following change in your medication:  1) INCREASE torsemide to 20 mg twice daily for three days then back to  20 mg once daily *If you need a refill on your cardiac medications before your next appointment, please  call your pharmacy*  Follow-Up: At [facility], you and your health needs are our priority.  As part of our continuing mission to provide you with exceptional heart care, we have created designated Provider Care Teams.  These Care Teams include your primary Cardiologist (physician) and Advanced Practice Providers (APPs -  Physician Assistants and Nurse Practitioners) who all work together to provide you with the care you need, when you need it.   Your next appointment:   6 month(s)  The format for your next appointment:   In Person  Provider:   Chelsea Aus, PA-C     Important Information About Sugar         Lorelei Pont, [place]  01/23/2022 10:17 AM    [facility]

## 2022-01-23 NOTE — Patient Instructions (Addendum)
Medication Instructions:  Your physician has recommended you make the following change in your medication:  1) INCREASE torsemide to 20 mg twice daily for three days then back to 20 mg once daily *If you need a refill on your cardiac medications before your next appointment, please call your pharmacy*  Follow-Up: At [facility], you and your health needs are our priority.  As part of our continuing mission to provide you with exceptional heart care, we have created designated Provider Care Teams.  These Care Teams include your primary Cardiologist (physician) and Advanced Practice Providers (APPs -  Physician Assistants and Nurse Practitioners) who all work together to provide you with the care you need, when you need it.   Your next appointment:   6 month(s)  The format for your next appointment:   In Person  Provider:   Chelsea Aus, PA-C     Important Information About Sugar

## 2022-01-24 DIAGNOSIS — E662 Morbid (severe) obesity with alveolar hypoventilation: Secondary | ICD-10-CM | POA: Diagnosis not present

## 2022-01-24 DIAGNOSIS — I69351 Hemiplegia and hemiparesis following cerebral infarction affecting right dominant side: Secondary | ICD-10-CM | POA: Diagnosis not present

## 2022-01-24 DIAGNOSIS — M24551 Contracture, right hip: Secondary | ICD-10-CM | POA: Diagnosis not present

## 2022-01-24 DIAGNOSIS — D6859 Other primary thrombophilia: Secondary | ICD-10-CM | POA: Diagnosis not present

## 2022-01-24 DIAGNOSIS — G40909 Epilepsy, unspecified, not intractable, without status epilepticus: Secondary | ICD-10-CM | POA: Diagnosis not present

## 2022-01-24 DIAGNOSIS — Z7901 Long term (current) use of anticoagulants: Secondary | ICD-10-CM | POA: Diagnosis not present

## 2022-01-24 DIAGNOSIS — I482 Chronic atrial fibrillation, unspecified: Secondary | ICD-10-CM | POA: Diagnosis not present

## 2022-01-24 DIAGNOSIS — Z6841 Body Mass Index (BMI) 40.0 and over, adult: Secondary | ICD-10-CM | POA: Diagnosis not present

## 2022-01-24 DIAGNOSIS — R293 Abnormal posture: Secondary | ICD-10-CM | POA: Diagnosis not present

## 2022-01-24 DIAGNOSIS — I5022 Chronic systolic (congestive) heart failure: Secondary | ICD-10-CM | POA: Diagnosis not present

## 2022-01-24 DIAGNOSIS — M24531 Contracture, right wrist: Secondary | ICD-10-CM | POA: Diagnosis not present

## 2022-01-24 DIAGNOSIS — Z9981 Dependence on supplemental oxygen: Secondary | ICD-10-CM | POA: Diagnosis not present

## 2022-01-24 DIAGNOSIS — I5032 Chronic diastolic (congestive) heart failure: Secondary | ICD-10-CM | POA: Diagnosis not present

## 2022-01-24 DIAGNOSIS — J9612 Chronic respiratory failure with hypercapnia: Secondary | ICD-10-CM | POA: Diagnosis not present

## 2022-01-27 DIAGNOSIS — I5022 Chronic systolic (congestive) heart failure: Secondary | ICD-10-CM | POA: Diagnosis not present

## 2022-01-27 DIAGNOSIS — M24551 Contracture, right hip: Secondary | ICD-10-CM | POA: Diagnosis not present

## 2022-01-27 DIAGNOSIS — I69351 Hemiplegia and hemiparesis following cerebral infarction affecting right dominant side: Secondary | ICD-10-CM | POA: Diagnosis not present

## 2022-01-27 DIAGNOSIS — M24531 Contracture, right wrist: Secondary | ICD-10-CM | POA: Diagnosis not present

## 2022-01-27 DIAGNOSIS — R293 Abnormal posture: Secondary | ICD-10-CM | POA: Diagnosis not present

## 2022-02-06 DIAGNOSIS — F331 Major depressive disorder, recurrent, moderate: Secondary | ICD-10-CM | POA: Diagnosis not present

## 2022-02-19 DIAGNOSIS — D6859 Other primary thrombophilia: Secondary | ICD-10-CM | POA: Diagnosis not present

## 2022-02-19 DIAGNOSIS — E662 Morbid (severe) obesity with alveolar hypoventilation: Secondary | ICD-10-CM | POA: Diagnosis not present

## 2022-02-19 DIAGNOSIS — Z7901 Long term (current) use of anticoagulants: Secondary | ICD-10-CM | POA: Diagnosis not present

## 2022-02-19 DIAGNOSIS — Z9981 Dependence on supplemental oxygen: Secondary | ICD-10-CM | POA: Diagnosis not present

## 2022-02-19 DIAGNOSIS — J9612 Chronic respiratory failure with hypercapnia: Secondary | ICD-10-CM | POA: Diagnosis not present

## 2022-02-19 DIAGNOSIS — R6889 Other general symptoms and signs: Secondary | ICD-10-CM | POA: Diagnosis not present

## 2022-02-19 DIAGNOSIS — I5032 Chronic diastolic (congestive) heart failure: Secondary | ICD-10-CM | POA: Diagnosis not present

## 2022-02-19 DIAGNOSIS — R61 Generalized hyperhidrosis: Secondary | ICD-10-CM | POA: Diagnosis not present

## 2022-02-20 DIAGNOSIS — L603 Nail dystrophy: Secondary | ICD-10-CM | POA: Diagnosis not present

## 2022-02-20 DIAGNOSIS — I739 Peripheral vascular disease, unspecified: Secondary | ICD-10-CM | POA: Diagnosis not present

## 2022-02-20 DIAGNOSIS — B351 Tinea unguium: Secondary | ICD-10-CM | POA: Diagnosis not present

## 2022-02-25 DIAGNOSIS — F331 Major depressive disorder, recurrent, moderate: Secondary | ICD-10-CM | POA: Diagnosis not present

## 2022-03-03 DIAGNOSIS — J9610 Chronic respiratory failure, unspecified whether with hypoxia or hypercapnia: Secondary | ICD-10-CM | POA: Diagnosis not present

## 2022-03-03 DIAGNOSIS — I4891 Unspecified atrial fibrillation: Secondary | ICD-10-CM | POA: Diagnosis not present

## 2022-03-03 DIAGNOSIS — D6859 Other primary thrombophilia: Secondary | ICD-10-CM | POA: Diagnosis not present

## 2022-03-03 DIAGNOSIS — E662 Morbid (severe) obesity with alveolar hypoventilation: Secondary | ICD-10-CM | POA: Diagnosis not present

## 2022-03-03 DIAGNOSIS — Z7901 Long term (current) use of anticoagulants: Secondary | ICD-10-CM | POA: Diagnosis not present

## 2022-03-03 DIAGNOSIS — F39 Unspecified mood [affective] disorder: Secondary | ICD-10-CM | POA: Diagnosis not present

## 2022-03-03 DIAGNOSIS — I69351 Hemiplegia and hemiparesis following cerebral infarction affecting right dominant side: Secondary | ICD-10-CM | POA: Diagnosis not present

## 2022-03-03 DIAGNOSIS — G40909 Epilepsy, unspecified, not intractable, without status epilepticus: Secondary | ICD-10-CM | POA: Diagnosis not present

## 2022-03-03 DIAGNOSIS — Z6841 Body Mass Index (BMI) 40.0 and over, adult: Secondary | ICD-10-CM | POA: Diagnosis not present

## 2022-03-03 DIAGNOSIS — I5032 Chronic diastolic (congestive) heart failure: Secondary | ICD-10-CM | POA: Diagnosis not present

## 2022-03-03 DIAGNOSIS — Z9981 Dependence on supplemental oxygen: Secondary | ICD-10-CM | POA: Diagnosis not present

## 2022-03-03 DIAGNOSIS — L723 Sebaceous cyst: Secondary | ICD-10-CM | POA: Diagnosis not present

## 2022-03-12 DIAGNOSIS — Z23 Encounter for immunization: Secondary | ICD-10-CM | POA: Diagnosis not present

## 2022-03-13 DIAGNOSIS — M24551 Contracture, right hip: Secondary | ICD-10-CM | POA: Diagnosis not present

## 2022-03-13 DIAGNOSIS — I5022 Chronic systolic (congestive) heart failure: Secondary | ICD-10-CM | POA: Diagnosis not present

## 2022-03-13 DIAGNOSIS — M24541 Contracture, right hand: Secondary | ICD-10-CM | POA: Diagnosis not present

## 2022-03-13 DIAGNOSIS — I69351 Hemiplegia and hemiparesis following cerebral infarction affecting right dominant side: Secondary | ICD-10-CM | POA: Diagnosis not present

## 2022-03-13 DIAGNOSIS — R293 Abnormal posture: Secondary | ICD-10-CM | POA: Diagnosis not present

## 2022-03-14 DIAGNOSIS — I5022 Chronic systolic (congestive) heart failure: Secondary | ICD-10-CM | POA: Diagnosis not present

## 2022-03-14 DIAGNOSIS — I69351 Hemiplegia and hemiparesis following cerebral infarction affecting right dominant side: Secondary | ICD-10-CM | POA: Diagnosis not present

## 2022-03-14 DIAGNOSIS — R293 Abnormal posture: Secondary | ICD-10-CM | POA: Diagnosis not present

## 2022-03-14 DIAGNOSIS — M24551 Contracture, right hip: Secondary | ICD-10-CM | POA: Diagnosis not present

## 2022-03-14 DIAGNOSIS — M24541 Contracture, right hand: Secondary | ICD-10-CM | POA: Diagnosis not present

## 2022-03-17 DIAGNOSIS — I69351 Hemiplegia and hemiparesis following cerebral infarction affecting right dominant side: Secondary | ICD-10-CM | POA: Diagnosis not present

## 2022-03-17 DIAGNOSIS — Z79899 Other long term (current) drug therapy: Secondary | ICD-10-CM | POA: Diagnosis not present

## 2022-03-17 DIAGNOSIS — M24541 Contracture, right hand: Secondary | ICD-10-CM | POA: Diagnosis not present

## 2022-03-17 DIAGNOSIS — I5022 Chronic systolic (congestive) heart failure: Secondary | ICD-10-CM | POA: Diagnosis not present

## 2022-03-17 DIAGNOSIS — R293 Abnormal posture: Secondary | ICD-10-CM | POA: Diagnosis not present

## 2022-03-17 DIAGNOSIS — F331 Major depressive disorder, recurrent, moderate: Secondary | ICD-10-CM | POA: Diagnosis not present

## 2022-03-17 DIAGNOSIS — M24551 Contracture, right hip: Secondary | ICD-10-CM | POA: Diagnosis not present

## 2022-03-18 DIAGNOSIS — R293 Abnormal posture: Secondary | ICD-10-CM | POA: Diagnosis not present

## 2022-03-18 DIAGNOSIS — I69351 Hemiplegia and hemiparesis following cerebral infarction affecting right dominant side: Secondary | ICD-10-CM | POA: Diagnosis not present

## 2022-03-18 DIAGNOSIS — M24541 Contracture, right hand: Secondary | ICD-10-CM | POA: Diagnosis not present

## 2022-03-18 DIAGNOSIS — M24551 Contracture, right hip: Secondary | ICD-10-CM | POA: Diagnosis not present

## 2022-03-18 DIAGNOSIS — I5022 Chronic systolic (congestive) heart failure: Secondary | ICD-10-CM | POA: Diagnosis not present

## 2022-03-18 DIAGNOSIS — F331 Major depressive disorder, recurrent, moderate: Secondary | ICD-10-CM | POA: Diagnosis not present

## 2022-03-19 DIAGNOSIS — I5022 Chronic systolic (congestive) heart failure: Secondary | ICD-10-CM | POA: Diagnosis not present

## 2022-03-19 DIAGNOSIS — I482 Chronic atrial fibrillation, unspecified: Secondary | ICD-10-CM | POA: Diagnosis not present

## 2022-03-19 DIAGNOSIS — M24551 Contracture, right hip: Secondary | ICD-10-CM | POA: Diagnosis not present

## 2022-03-19 DIAGNOSIS — I69351 Hemiplegia and hemiparesis following cerebral infarction affecting right dominant side: Secondary | ICD-10-CM | POA: Diagnosis not present

## 2022-03-19 DIAGNOSIS — R293 Abnormal posture: Secondary | ICD-10-CM | POA: Diagnosis not present

## 2022-03-19 DIAGNOSIS — M24541 Contracture, right hand: Secondary | ICD-10-CM | POA: Diagnosis not present

## 2022-03-20 DIAGNOSIS — R293 Abnormal posture: Secondary | ICD-10-CM | POA: Diagnosis not present

## 2022-03-20 DIAGNOSIS — M24551 Contracture, right hip: Secondary | ICD-10-CM | POA: Diagnosis not present

## 2022-03-20 DIAGNOSIS — M24541 Contracture, right hand: Secondary | ICD-10-CM | POA: Diagnosis not present

## 2022-03-20 DIAGNOSIS — I69351 Hemiplegia and hemiparesis following cerebral infarction affecting right dominant side: Secondary | ICD-10-CM | POA: Diagnosis not present

## 2022-03-20 DIAGNOSIS — I5022 Chronic systolic (congestive) heart failure: Secondary | ICD-10-CM | POA: Diagnosis not present

## 2022-03-21 DIAGNOSIS — R293 Abnormal posture: Secondary | ICD-10-CM | POA: Diagnosis not present

## 2022-03-21 DIAGNOSIS — M24551 Contracture, right hip: Secondary | ICD-10-CM | POA: Diagnosis not present

## 2022-03-21 DIAGNOSIS — I69351 Hemiplegia and hemiparesis following cerebral infarction affecting right dominant side: Secondary | ICD-10-CM | POA: Diagnosis not present

## 2022-03-21 DIAGNOSIS — I5022 Chronic systolic (congestive) heart failure: Secondary | ICD-10-CM | POA: Diagnosis not present

## 2022-03-21 DIAGNOSIS — M24541 Contracture, right hand: Secondary | ICD-10-CM | POA: Diagnosis not present

## 2022-03-24 DIAGNOSIS — R293 Abnormal posture: Secondary | ICD-10-CM | POA: Diagnosis not present

## 2022-03-24 DIAGNOSIS — M24551 Contracture, right hip: Secondary | ICD-10-CM | POA: Diagnosis not present

## 2022-03-24 DIAGNOSIS — M24541 Contracture, right hand: Secondary | ICD-10-CM | POA: Diagnosis not present

## 2022-03-24 DIAGNOSIS — I69351 Hemiplegia and hemiparesis following cerebral infarction affecting right dominant side: Secondary | ICD-10-CM | POA: Diagnosis not present

## 2022-03-24 DIAGNOSIS — I5022 Chronic systolic (congestive) heart failure: Secondary | ICD-10-CM | POA: Diagnosis not present

## 2022-03-25 DIAGNOSIS — R293 Abnormal posture: Secondary | ICD-10-CM | POA: Diagnosis not present

## 2022-03-25 DIAGNOSIS — M24541 Contracture, right hand: Secondary | ICD-10-CM | POA: Diagnosis not present

## 2022-03-25 DIAGNOSIS — M24551 Contracture, right hip: Secondary | ICD-10-CM | POA: Diagnosis not present

## 2022-03-25 DIAGNOSIS — I5022 Chronic systolic (congestive) heart failure: Secondary | ICD-10-CM | POA: Diagnosis not present

## 2022-03-25 DIAGNOSIS — I69351 Hemiplegia and hemiparesis following cerebral infarction affecting right dominant side: Secondary | ICD-10-CM | POA: Diagnosis not present

## 2022-03-26 DIAGNOSIS — I69351 Hemiplegia and hemiparesis following cerebral infarction affecting right dominant side: Secondary | ICD-10-CM | POA: Diagnosis not present

## 2022-03-26 DIAGNOSIS — I5022 Chronic systolic (congestive) heart failure: Secondary | ICD-10-CM | POA: Diagnosis not present

## 2022-03-26 DIAGNOSIS — F331 Major depressive disorder, recurrent, moderate: Secondary | ICD-10-CM | POA: Diagnosis not present

## 2022-03-26 DIAGNOSIS — M24551 Contracture, right hip: Secondary | ICD-10-CM | POA: Diagnosis not present

## 2022-03-26 DIAGNOSIS — M24541 Contracture, right hand: Secondary | ICD-10-CM | POA: Diagnosis not present

## 2022-03-26 DIAGNOSIS — R293 Abnormal posture: Secondary | ICD-10-CM | POA: Diagnosis not present

## 2022-03-26 DIAGNOSIS — I482 Chronic atrial fibrillation, unspecified: Secondary | ICD-10-CM | POA: Diagnosis not present

## 2022-04-01 DIAGNOSIS — F331 Major depressive disorder, recurrent, moderate: Secondary | ICD-10-CM | POA: Diagnosis not present

## 2022-04-02 DIAGNOSIS — F331 Major depressive disorder, recurrent, moderate: Secondary | ICD-10-CM | POA: Diagnosis not present

## 2022-04-07 DIAGNOSIS — F331 Major depressive disorder, recurrent, moderate: Secondary | ICD-10-CM | POA: Diagnosis not present

## 2022-04-07 DIAGNOSIS — I482 Chronic atrial fibrillation, unspecified: Secondary | ICD-10-CM | POA: Diagnosis not present

## 2022-04-07 DIAGNOSIS — L98492 Non-pressure chronic ulcer of skin of other sites with fat layer exposed: Secondary | ICD-10-CM | POA: Diagnosis not present

## 2022-04-09 DIAGNOSIS — L089 Local infection of the skin and subcutaneous tissue, unspecified: Secondary | ICD-10-CM | POA: Diagnosis not present

## 2022-04-09 DIAGNOSIS — Z6835 Body mass index (BMI) 35.0-35.9, adult: Secondary | ICD-10-CM | POA: Diagnosis not present

## 2022-04-09 DIAGNOSIS — R634 Abnormal weight loss: Secondary | ICD-10-CM | POA: Diagnosis not present

## 2022-04-09 DIAGNOSIS — S31109A Unspecified open wound of abdominal wall, unspecified quadrant without penetration into peritoneal cavity, initial encounter: Secondary | ICD-10-CM | POA: Diagnosis not present

## 2022-04-14 DIAGNOSIS — F331 Major depressive disorder, recurrent, moderate: Secondary | ICD-10-CM | POA: Diagnosis not present

## 2022-04-16 DIAGNOSIS — Z23 Encounter for immunization: Secondary | ICD-10-CM | POA: Diagnosis not present

## 2022-04-21 DIAGNOSIS — F331 Major depressive disorder, recurrent, moderate: Secondary | ICD-10-CM | POA: Diagnosis not present

## 2022-04-22 DIAGNOSIS — F331 Major depressive disorder, recurrent, moderate: Secondary | ICD-10-CM | POA: Diagnosis not present

## 2022-04-28 DIAGNOSIS — F331 Major depressive disorder, recurrent, moderate: Secondary | ICD-10-CM | POA: Diagnosis not present

## 2022-05-12 DIAGNOSIS — I10 Essential (primary) hypertension: Secondary | ICD-10-CM | POA: Diagnosis not present

## 2022-05-12 DIAGNOSIS — F331 Major depressive disorder, recurrent, moderate: Secondary | ICD-10-CM | POA: Diagnosis not present

## 2022-05-12 DIAGNOSIS — E119 Type 2 diabetes mellitus without complications: Secondary | ICD-10-CM | POA: Diagnosis not present

## 2022-05-14 DIAGNOSIS — E785 Hyperlipidemia, unspecified: Secondary | ICD-10-CM | POA: Diagnosis not present

## 2022-05-14 DIAGNOSIS — I509 Heart failure, unspecified: Secondary | ICD-10-CM | POA: Diagnosis not present

## 2022-05-14 DIAGNOSIS — Z66 Do not resuscitate: Secondary | ICD-10-CM | POA: Diagnosis not present

## 2022-05-16 ENCOUNTER — Non-Acute Institutional Stay: Payer: Medicare Other | Admitting: Hospice

## 2022-05-16 DIAGNOSIS — Z515 Encounter for palliative care: Secondary | ICD-10-CM | POA: Diagnosis not present

## 2022-05-16 DIAGNOSIS — I5032 Chronic diastolic (congestive) heart failure: Secondary | ICD-10-CM

## 2022-05-16 DIAGNOSIS — R0602 Shortness of breath: Secondary | ICD-10-CM

## 2022-05-16 DIAGNOSIS — G40909 Epilepsy, unspecified, not intractable, without status epilepticus: Secondary | ICD-10-CM | POA: Diagnosis not present

## 2022-05-16 DIAGNOSIS — F339 Major depressive disorder, recurrent, unspecified: Secondary | ICD-10-CM | POA: Diagnosis not present

## 2022-05-16 NOTE — Progress Notes (Signed)
Therapist, nutritional Palliative Care Consult Note Telephone: [phone]  Fax: [phone]  PATIENT NAME: James Villegas DOB: 1956-03-31 MRN: [id number]  PRIMARY CARE PROVIDER:   Karna Dupes, MD Karna Dupes, MD [address] [place],  [place] [address]  REFERRING PROVIDER: Karna Dupes, MD Karna Dupes, MD [address] [place],  [place] [address]   RESPONSIBLE PARTY: Self/Sister [name] [phone] Contact Information     Name Relation Home Work Mobile   Jamaar, Howes Sister [phone]  [phone]       Visit is to build trust and highlight Palliative Medicine as specialized medical care for people living with serious illness, aimed at facilitating better quality of life through symptoms relief, assisting with advance care planning and complex medical decision making. This is a follow up visit.  RECOMMENDATIONS/PLAN:    CODE STATUS: Patient is a Do Not Resuscitate   GOALS OF CARE: Goals of care include to maximize quality of life and symptom management.   MOST selections include comfort measures antibiotics if indicated, no IV fluids, no feeding tube.  Both signed DNR and MOST form uploaded to [facility] health electronic medical record epic.  Visit consisted of counseling and education dealing with the complex and emotionally intense issues of symptom management and palliative care in the setting of serious and potentially life-threatening illness. Palliative care team will continue to support patient, patient's family, and medical team.  Symptom management/Plan:  Seizure disorder: Continue Dilantin and Phenytoin as ordered. Seizure precautions.  Shortness of breath: related to diastolic congestive heart failure; Continue with continuous oxygen supplementation  4L/Min. Adhere to salt/fluid limits prevent fluid overload.  CHF:  Education provided on Fluid restriction 1574ml/24 hours, no added salt, Elevate BLE.  Continue Torsemide as ordered. Ace wrap to right lower leg for edema.  Depression: Managed with Venlafaxin.  Encouraged socialization and participation in facility activities.  Psych following. Follow up: Palliative care will continue to follow for complex medical decision making, advance care planning, and clarification of goals. Return 6 weeks or prn. Encouraged to call provider sooner with any concerns.  CHIEF COMPLAINT: Palliative follow up  HISTORY OF PRESENT ILLNESS:  James Villegas a 66 y.o. 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 History obtained from review of EMR, discussion with primary team, family and/or patient. Records reviewed and summarized above. All 10 point systems reviewed and are negative except as documented in history of present illness above Review and summarization of Epic records shows history from other than patient.  I reviewed, as needed, available labs, patient records, imaging, studies and related documents from the EMR.  Palliative Care was asked to follow this patient o help address complex decision making in the context of advance care planning and goals of care clarification.    PERTINENT MEDICATIONS:  Outpatient Encounter Medications as of 05/16/2022  Medication Sig   acetaminophen (TYLENOL) 325 MG tablet Take 650 mg by mouth every 6 (six) hours as needed for mild pain or headache.   Carboxymeth-Glycerin-Polysorb (REFRESH OPTIVE ADVANCED) 0.5-1-0.5 % SOLN Place 1 drop into both eyes in the morning, at noon, in the evening, and at bedtime. Wait 3-5 minutes between eye medications   cycloSPORINE (RESTASIS) 0.05 % ophthalmic emulsion Place 1 drop into both eyes 2 (two) times daily.  empagliflozin (JARDIANCE) 10 MG TABS tablet Take by  mouth daily.   ezetimibe (ZETIA) 10 MG tablet Take 10 mg by mouth daily.   fish oil-omega-3 fatty acids 1000 MG capsule Take 1,000 mg by mouth 2 (two) times daily with a meal.   omeprazole (PRILOSEC) 20 MG capsule Take 20 mg by mouth daily.   OXYGEN Inhale into the lungs See admin instructions. Nightly 9p-8a   phenytoin (DILANTIN) 100 MG ER capsule Take 200 mg by mouth 2 (two) times daily.   phenytoin (DILANTIN) 50 MG tablet Chew 50 mg by mouth daily.   potassium chloride SA (KLOR-CON) 20 MEQ tablet Take 2 tablets (40 mEq total) by mouth daily.   rosuvastatin (CRESTOR) 40 MG tablet Take 40 mg by mouth at bedtime.   spironolactone (ALDACTONE) 25 MG tablet Take 1 tablet (25 mg total) by mouth daily.   torsemide (DEMADEX) 20 MG tablet Take 20 mg by mouth every other day.   torsemide (DEMADEX) 20 MG tablet Take 20 mg by mouth 2 (two) times daily.   traZODone (DESYREL) 50 MG tablet Take 50 mg by mouth at bedtime.   venlafaxine XR (EFFEXOR-XR) 37.5 MG 24 hr capsule Take 37.5 mg by mouth at bedtime.   warfarin (COUMADIN) 1 MG tablet Take 0.5 mg by mouth. Tues/Thur/Sat/Sun   warfarin (COUMADIN) 4 MG tablet Take 4 mg by mouth. take 1 tab daily on Mon/Wed/Fri and tab4.5 on other days   No facility-administered encounter medications on file as of 05/16/2022.    HOSPICE ELIGIBILITY/DIAGNOSIS: TBD  PAST MEDICAL HISTORY:  Past Medical History:  Diagnosis Date   Acute on chronic congestive heart failure (HCC)    Anemia, secondary    SECONDARY TO ACUTE BLOOD LOSS   Anxiety disorder    Bloody stool 08/29/2011   intermittent along with constipation.    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 IVC filter   Seizure disorder (HCC)      Review lab tests/diagnostics No results for input(s): "WBC", "HGB", "HCT", "PLT", "MCV" in the last 168 hours. No results for input(s): "NA", "K", "CL", "CO2", "BUN", "CREATININE", "GLUCOSE" in the last 168 hours. CrCl cannot be calculated (Patient's most recent lab result is older than the maximum 21 days allowed.).  ALLERGIES:  Allergies  Allergen Reactions   Fenofibrate Other (See Comments)    Per MAR      I spent 45 minutes providing this consultation; this includes time spent with patient/family, chart review and documentation. More than 50% of the time in this consultation was spent on counseling and coordinating communication   Thank you for the opportunity to participate in the care of JOMARI BARTNIK Please call our office at [phone] if we can be of additional assistance.  Note: Portions of this note were generated with Scientist, clinical (histocompatibility and immunogenetics). Dictation errors may occur despite best attempts at proofreading.  Rosaura Carpenter, NP

## 2022-05-19 DIAGNOSIS — F331 Major depressive disorder, recurrent, moderate: Secondary | ICD-10-CM | POA: Diagnosis not present

## 2022-05-22 DIAGNOSIS — E662 Morbid (severe) obesity with alveolar hypoventilation: Secondary | ICD-10-CM | POA: Diagnosis not present

## 2022-05-22 DIAGNOSIS — R293 Abnormal posture: Secondary | ICD-10-CM | POA: Diagnosis not present

## 2022-05-22 DIAGNOSIS — I69351 Hemiplegia and hemiparesis following cerebral infarction affecting right dominant side: Secondary | ICD-10-CM | POA: Diagnosis not present

## 2022-05-22 DIAGNOSIS — M24551 Contracture, right hip: Secondary | ICD-10-CM | POA: Diagnosis not present

## 2022-05-22 DIAGNOSIS — M24541 Contracture, right hand: Secondary | ICD-10-CM | POA: Diagnosis not present

## 2022-05-22 DIAGNOSIS — I5022 Chronic systolic (congestive) heart failure: Secondary | ICD-10-CM | POA: Diagnosis not present

## 2022-05-23 DIAGNOSIS — I5022 Chronic systolic (congestive) heart failure: Secondary | ICD-10-CM | POA: Diagnosis not present

## 2022-05-23 DIAGNOSIS — M24541 Contracture, right hand: Secondary | ICD-10-CM | POA: Diagnosis not present

## 2022-05-23 DIAGNOSIS — I69351 Hemiplegia and hemiparesis following cerebral infarction affecting right dominant side: Secondary | ICD-10-CM | POA: Diagnosis not present

## 2022-05-23 DIAGNOSIS — E662 Morbid (severe) obesity with alveolar hypoventilation: Secondary | ICD-10-CM | POA: Diagnosis not present

## 2022-05-23 DIAGNOSIS — R293 Abnormal posture: Secondary | ICD-10-CM | POA: Diagnosis not present

## 2022-05-23 DIAGNOSIS — M24551 Contracture, right hip: Secondary | ICD-10-CM | POA: Diagnosis not present

## 2022-05-26 DIAGNOSIS — F331 Major depressive disorder, recurrent, moderate: Secondary | ICD-10-CM | POA: Diagnosis not present

## 2022-05-26 DIAGNOSIS — M24551 Contracture, right hip: Secondary | ICD-10-CM | POA: Diagnosis not present

## 2022-05-26 DIAGNOSIS — I69351 Hemiplegia and hemiparesis following cerebral infarction affecting right dominant side: Secondary | ICD-10-CM | POA: Diagnosis not present

## 2022-05-26 DIAGNOSIS — I5022 Chronic systolic (congestive) heart failure: Secondary | ICD-10-CM | POA: Diagnosis not present

## 2022-05-26 DIAGNOSIS — E662 Morbid (severe) obesity with alveolar hypoventilation: Secondary | ICD-10-CM | POA: Diagnosis not present

## 2022-05-26 DIAGNOSIS — M24541 Contracture, right hand: Secondary | ICD-10-CM | POA: Diagnosis not present

## 2022-05-26 DIAGNOSIS — R293 Abnormal posture: Secondary | ICD-10-CM | POA: Diagnosis not present

## 2022-05-27 DIAGNOSIS — M24541 Contracture, right hand: Secondary | ICD-10-CM | POA: Diagnosis not present

## 2022-05-27 DIAGNOSIS — I69351 Hemiplegia and hemiparesis following cerebral infarction affecting right dominant side: Secondary | ICD-10-CM | POA: Diagnosis not present

## 2022-05-27 DIAGNOSIS — E662 Morbid (severe) obesity with alveolar hypoventilation: Secondary | ICD-10-CM | POA: Diagnosis not present

## 2022-05-27 DIAGNOSIS — R293 Abnormal posture: Secondary | ICD-10-CM | POA: Diagnosis not present

## 2022-05-27 DIAGNOSIS — M24551 Contracture, right hip: Secondary | ICD-10-CM | POA: Diagnosis not present

## 2022-05-27 DIAGNOSIS — I5022 Chronic systolic (congestive) heart failure: Secondary | ICD-10-CM | POA: Diagnosis not present

## 2022-05-28 DIAGNOSIS — R293 Abnormal posture: Secondary | ICD-10-CM | POA: Diagnosis not present

## 2022-05-28 DIAGNOSIS — M24541 Contracture, right hand: Secondary | ICD-10-CM | POA: Diagnosis not present

## 2022-05-28 DIAGNOSIS — E662 Morbid (severe) obesity with alveolar hypoventilation: Secondary | ICD-10-CM | POA: Diagnosis not present

## 2022-05-28 DIAGNOSIS — I69351 Hemiplegia and hemiparesis following cerebral infarction affecting right dominant side: Secondary | ICD-10-CM | POA: Diagnosis not present

## 2022-05-28 DIAGNOSIS — I5022 Chronic systolic (congestive) heart failure: Secondary | ICD-10-CM | POA: Diagnosis not present

## 2022-05-28 DIAGNOSIS — M24551 Contracture, right hip: Secondary | ICD-10-CM | POA: Diagnosis not present

## 2022-05-29 DIAGNOSIS — I69351 Hemiplegia and hemiparesis following cerebral infarction affecting right dominant side: Secondary | ICD-10-CM | POA: Diagnosis not present

## 2022-05-29 DIAGNOSIS — I5022 Chronic systolic (congestive) heart failure: Secondary | ICD-10-CM | POA: Diagnosis not present

## 2022-05-29 DIAGNOSIS — R293 Abnormal posture: Secondary | ICD-10-CM | POA: Diagnosis not present

## 2022-05-29 DIAGNOSIS — E662 Morbid (severe) obesity with alveolar hypoventilation: Secondary | ICD-10-CM | POA: Diagnosis not present

## 2022-05-29 DIAGNOSIS — M24541 Contracture, right hand: Secondary | ICD-10-CM | POA: Diagnosis not present

## 2022-05-29 DIAGNOSIS — M24551 Contracture, right hip: Secondary | ICD-10-CM | POA: Diagnosis not present

## 2022-05-30 DIAGNOSIS — M24551 Contracture, right hip: Secondary | ICD-10-CM | POA: Diagnosis not present

## 2022-05-30 DIAGNOSIS — E662 Morbid (severe) obesity with alveolar hypoventilation: Secondary | ICD-10-CM | POA: Diagnosis not present

## 2022-05-30 DIAGNOSIS — M24541 Contracture, right hand: Secondary | ICD-10-CM | POA: Diagnosis not present

## 2022-05-30 DIAGNOSIS — R293 Abnormal posture: Secondary | ICD-10-CM | POA: Diagnosis not present

## 2022-05-30 DIAGNOSIS — I5022 Chronic systolic (congestive) heart failure: Secondary | ICD-10-CM | POA: Diagnosis not present

## 2022-05-30 DIAGNOSIS — I69351 Hemiplegia and hemiparesis following cerebral infarction affecting right dominant side: Secondary | ICD-10-CM | POA: Diagnosis not present

## 2022-06-03 DIAGNOSIS — R293 Abnormal posture: Secondary | ICD-10-CM | POA: Diagnosis not present

## 2022-06-03 DIAGNOSIS — M24551 Contracture, right hip: Secondary | ICD-10-CM | POA: Diagnosis not present

## 2022-06-03 DIAGNOSIS — M24541 Contracture, right hand: Secondary | ICD-10-CM | POA: Diagnosis not present

## 2022-06-03 DIAGNOSIS — E662 Morbid (severe) obesity with alveolar hypoventilation: Secondary | ICD-10-CM | POA: Diagnosis not present

## 2022-06-03 DIAGNOSIS — I69351 Hemiplegia and hemiparesis following cerebral infarction affecting right dominant side: Secondary | ICD-10-CM | POA: Diagnosis not present

## 2022-06-03 DIAGNOSIS — I5022 Chronic systolic (congestive) heart failure: Secondary | ICD-10-CM | POA: Diagnosis not present

## 2022-06-04 DIAGNOSIS — R293 Abnormal posture: Secondary | ICD-10-CM | POA: Diagnosis not present

## 2022-06-04 DIAGNOSIS — I5022 Chronic systolic (congestive) heart failure: Secondary | ICD-10-CM | POA: Diagnosis not present

## 2022-06-04 DIAGNOSIS — M24551 Contracture, right hip: Secondary | ICD-10-CM | POA: Diagnosis not present

## 2022-06-04 DIAGNOSIS — I69351 Hemiplegia and hemiparesis following cerebral infarction affecting right dominant side: Secondary | ICD-10-CM | POA: Diagnosis not present

## 2022-06-04 DIAGNOSIS — E662 Morbid (severe) obesity with alveolar hypoventilation: Secondary | ICD-10-CM | POA: Diagnosis not present

## 2022-06-04 DIAGNOSIS — M24541 Contracture, right hand: Secondary | ICD-10-CM | POA: Diagnosis not present

## 2022-06-12 DIAGNOSIS — F331 Major depressive disorder, recurrent, moderate: Secondary | ICD-10-CM | POA: Diagnosis not present

## 2022-06-16 DIAGNOSIS — E46 Unspecified protein-calorie malnutrition: Secondary | ICD-10-CM | POA: Diagnosis not present

## 2022-06-16 DIAGNOSIS — Z9981 Dependence on supplemental oxygen: Secondary | ICD-10-CM | POA: Diagnosis not present

## 2022-06-16 DIAGNOSIS — I482 Chronic atrial fibrillation, unspecified: Secondary | ICD-10-CM | POA: Diagnosis not present

## 2022-06-16 DIAGNOSIS — I69351 Hemiplegia and hemiparesis following cerebral infarction affecting right dominant side: Secondary | ICD-10-CM | POA: Diagnosis not present

## 2022-06-16 DIAGNOSIS — F39 Unspecified mood [affective] disorder: Secondary | ICD-10-CM | POA: Diagnosis not present

## 2022-06-16 DIAGNOSIS — J9610 Chronic respiratory failure, unspecified whether with hypoxia or hypercapnia: Secondary | ICD-10-CM | POA: Diagnosis not present

## 2022-06-16 DIAGNOSIS — D6859 Other primary thrombophilia: Secondary | ICD-10-CM | POA: Diagnosis not present

## 2022-06-16 DIAGNOSIS — I5032 Chronic diastolic (congestive) heart failure: Secondary | ICD-10-CM | POA: Diagnosis not present

## 2022-06-16 DIAGNOSIS — Z6839 Body mass index (BMI) 39.0-39.9, adult: Secondary | ICD-10-CM | POA: Diagnosis not present

## 2022-06-16 DIAGNOSIS — S90219A Contusion of unspecified great toe with damage to nail, initial encounter: Secondary | ICD-10-CM | POA: Diagnosis not present

## 2022-06-16 DIAGNOSIS — E662 Morbid (severe) obesity with alveolar hypoventilation: Secondary | ICD-10-CM | POA: Diagnosis not present

## 2022-06-16 DIAGNOSIS — L723 Sebaceous cyst: Secondary | ICD-10-CM | POA: Diagnosis not present

## 2022-06-16 DIAGNOSIS — G40909 Epilepsy, unspecified, not intractable, without status epilepticus: Secondary | ICD-10-CM | POA: Diagnosis not present

## 2022-06-16 DIAGNOSIS — Z7901 Long term (current) use of anticoagulants: Secondary | ICD-10-CM | POA: Diagnosis not present

## 2022-06-17 ENCOUNTER — Non-Acute Institutional Stay: Payer: Medicare Other | Admitting: Hospice

## 2022-06-17 DIAGNOSIS — G40909 Epilepsy, unspecified, not intractable, without status epilepticus: Secondary | ICD-10-CM

## 2022-06-17 DIAGNOSIS — Z515 Encounter for palliative care: Secondary | ICD-10-CM

## 2022-06-17 DIAGNOSIS — F339 Major depressive disorder, recurrent, unspecified: Secondary | ICD-10-CM | POA: Diagnosis not present

## 2022-06-17 DIAGNOSIS — R0602 Shortness of breath: Secondary | ICD-10-CM | POA: Diagnosis not present

## 2022-06-17 DIAGNOSIS — I5032 Chronic diastolic (congestive) heart failure: Secondary | ICD-10-CM

## 2022-06-17 NOTE — Progress Notes (Signed)
Therapist, nutritional Palliative Care Consult Note Telephone: [phone]  Fax: [phone]  PATIENT NAME: James Villegas DOB: Jul 15, 1955 MRN: [id number]  PRIMARY CARE PROVIDER:   Karna Dupes, MD Karna Dupes, MD [address] [place],  [place] [address]  REFERRING PROVIDER: Karna Dupes, MD Karna Dupes, MD [address] [place],  [place] [address]   RESPONSIBLE PARTY: Self/Sister [name] [phone] Contact Information     Name Relation Home Work Mobile   Jaan, Fischel Sister [phone]  [phone]       Visit is to build trust and highlight Palliative Medicine as specialized medical care for people living with serious illness, aimed at facilitating better quality of life through symptoms relief, assisting with advance care planning and complex medical decision making. This is a follow up visit.  RECOMMENDATIONS/PLAN:    CODE STATUS: Patient is a Do Not Resuscitate   GOALS OF CARE: Goals of care include to maximize quality of life and symptom management.   MOST selections include comfort measures antibiotics if indicated, no IV fluids, no feeding tube.  Both signed DNR and MOST form uploaded to [facility] health electronic medical record epic.  Visit consisted of counseling and education dealing with the complex and emotionally intense issues of symptom management and palliative care in the setting of serious and potentially life-threatening illness. Palliative care team will continue to support patient, patient's family, and medical team.  Symptom management/Plan:  Seizure disorder: No report of seizures since last visit.  Continue Dilantin and Phenytoin as ordered. Seizure precautions.  Shortness of breath: related to diastolic congestive heart failure; Continue with continuous oxygen supplementation  4L/Min. Adhere to salt/fluid limits prevent fluid overload.  CHF: Patient continues to drink Pepsi.  Education  reiterated on Fluid restriction 1562ml/24 hours, no added salt, Elevate BLE. Continue Torsemide as ordered. Ace wrap to right lower leg for edema.  Depression: Reports good mood.  Continue with Venlafaxin.  Encouraged socialization and participation in facility activities.  Psych following. Follow up: Palliative care will continue to follow for complex medical decision making, advance care planning, and clarification of goals. Return 6 weeks or prn. Encouraged to call provider sooner with any concerns.  CHIEF COMPLAINT: Palliative follow up  HISTORY OF PRESENT ILLNESS:  James Villegas a 67 y.o. 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 History obtained from review of EMR, discussion with primary team, family and/or patient. Records reviewed and summarized above. All 10 point systems reviewed and are negative except as documented in history of present illness above Review and summarization of Epic records shows history from other than patient.  I reviewed, as needed, available labs, patient records, imaging, studies and related documents from the EMR.  Palliative Care was asked to follow this patient o help address complex decision making in the context of advance care planning and goals of care clarification.    PERTINENT MEDICATIONS:  Outpatient Encounter Medications as of 06/17/2022  Medication Sig   acetaminophen (TYLENOL) 325 MG tablet Take 650 mg by mouth every 6 (six) hours as needed for mild pain or headache.   Carboxymeth-Glycerin-Polysorb (REFRESH OPTIVE ADVANCED) 0.5-1-0.5 % SOLN Place 1 drop into both eyes in the morning, at noon, in the evening, and at bedtime. Wait 3-5 minutes between eye medications  cycloSPORINE (RESTASIS) 0.05 % ophthalmic emulsion Place 1  drop into both eyes 2 (two) times daily.   empagliflozin (JARDIANCE) 10 MG TABS tablet Take by mouth daily.   ezetimibe (ZETIA) 10 MG tablet Take 10 mg by mouth daily.   fish oil-omega-3 fatty acids 1000 MG capsule Take 1,000 mg by mouth 2 (two) times daily with a meal.   omeprazole (PRILOSEC) 20 MG capsule Take 20 mg by mouth daily.   OXYGEN Inhale into the lungs See admin instructions. Nightly 9p-8a   phenytoin (DILANTIN) 100 MG ER capsule Take 200 mg by mouth 2 (two) times daily.   phenytoin (DILANTIN) 50 MG tablet Chew 50 mg by mouth daily.   potassium chloride SA (KLOR-CON) 20 MEQ tablet Take 2 tablets (40 mEq total) by mouth daily.   rosuvastatin (CRESTOR) 40 MG tablet Take 40 mg by mouth at bedtime.   spironolactone (ALDACTONE) 25 MG tablet Take 1 tablet (25 mg total) by mouth daily.   torsemide (DEMADEX) 20 MG tablet Take 20 mg by mouth every other day.   torsemide (DEMADEX) 20 MG tablet Take 20 mg by mouth 2 (two) times daily.   traZODone (DESYREL) 50 MG tablet Take 50 mg by mouth at bedtime.   venlafaxine XR (EFFEXOR-XR) 37.5 MG 24 hr capsule Take 37.5 mg by mouth at bedtime.   warfarin (COUMADIN) 1 MG tablet Take 0.5 mg by mouth. Tues/Thur/Sat/Sun   warfarin (COUMADIN) 4 MG tablet Take 4 mg by mouth. take 1 tab daily on Mon/Wed/Fri and tab4.5 on other days   No facility-administered encounter medications on file as of 06/17/2022.    HOSPICE ELIGIBILITY/DIAGNOSIS: TBD  PAST MEDICAL HISTORY:  Past Medical History:  Diagnosis Date   Acute on chronic congestive heart failure (HCC)    Anemia, secondary    SECONDARY TO ACUTE BLOOD LOSS   Anxiety disorder    Bloody stool 08/29/2011   intermittent along with constipation.    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   Depression    DVT of lower extremity (deep venous thrombosis) ([place])    RIGHT LOWER; s/p IVC filter 7/12   Dysphagia    Hyperlipidemia    Hypertension    Lupus  anticoagulant disorder ([place]) 1990   Morbid obesity (HCC)    PFO (patent foramen ovale)    TEE 2/10: EF 60%, trivial AI, mild Ao root dilatation, mod PFO with R-L shunting, atrial septal aneurysm;   echo 7/12: EF 65-70%, grade 1 diast dysfxn, mild MR, LVOT showed severe obstruction   Protein C deficiency (HCC)    Protein S deficiency (HCC)    Rectus sheath hematoma 7/12   required reversal of anticoagulation and c/b DVT req. IVC filter   Seizure disorder (HCC)      ALLERGIES:  Allergies  Allergen Reactions   Fenofibrate Other (See Comments)    Per MAR      I spent 35 minutes providing this consultation; this includes time spent with patient/family, chart review and documentation. More than 50% of the time in this consultation was spent on counseling and coordinating communication   Thank you for the opportunity to participate in the care of James Villegas Please call our office at [phone] if we can be of additional assistance.  Note: Portions of this note were generated with Lobbyist. Dictation errors may occur despite best attempts at proofreading.  Teodoro Spray, NP

## 2022-07-01 DIAGNOSIS — H04123 Dry eye syndrome of bilateral lacrimal glands: Secondary | ICD-10-CM | POA: Diagnosis not present

## 2022-07-01 DIAGNOSIS — H2513 Age-related nuclear cataract, bilateral: Secondary | ICD-10-CM | POA: Diagnosis not present

## 2022-07-10 DIAGNOSIS — H2513 Age-related nuclear cataract, bilateral: Secondary | ICD-10-CM | POA: Diagnosis not present

## 2022-07-22 ENCOUNTER — Non-Acute Institutional Stay: Payer: Medicare Other | Admitting: Hospice

## 2022-07-22 DIAGNOSIS — R0602 Shortness of breath: Secondary | ICD-10-CM | POA: Diagnosis not present

## 2022-07-22 DIAGNOSIS — G40909 Epilepsy, unspecified, not intractable, without status epilepticus: Secondary | ICD-10-CM | POA: Diagnosis not present

## 2022-07-22 DIAGNOSIS — Z515 Encounter for palliative care: Secondary | ICD-10-CM | POA: Diagnosis not present

## 2022-07-22 DIAGNOSIS — F339 Major depressive disorder, recurrent, unspecified: Secondary | ICD-10-CM

## 2022-07-22 DIAGNOSIS — I5032 Chronic diastolic (congestive) heart failure: Secondary | ICD-10-CM

## 2022-07-22 NOTE — Progress Notes (Signed)
[facility] Consult Note Telephone: [phone]  Fax: [phone]  PATIENT NAME: James Villegas DOB: [birth year] MRN: [id number]  PRIMARY CARE PROVIDER:   Raymondo Band, MD Raymondo Band, MD [address] [place],  [place] [address]  REFERRING PROVIDER: Raymondo Band, MD Raymondo Band, MD [address] [place],  [place] [address]   RESPONSIBLE PARTY: Self/Sister - Susan [address]     Name Relation Home Work Glendora Sister [phone]  [phone]       Visit is to build trust and highlight Palliative Medicine as specialized medical care for people living with serious illness, aimed at facilitating better quality of life through symptoms relief, assisting with advance care planning and complex medical decision making. This is a follow up visit. Review of ACP today. NP called Manuela Schwartz and updated her on visit; also discussed patient's upcoming eye cataract surgery as requested by patient.  Advance Care Planning: Our advance care planning conversation included a discussion about:    The value and importance of advance care planning  Difference between Hospice and Palliative care Exploration of goals of care in the event of a sudden injury or illness  Identification and preparation of a healthcare agent  Review and updating or creation of an  advance directive document . Decision not to resuscitate or to de-escalate disease focused treatments due to poor prognosis.  CODE STATUS: Patient affirmed he is a Do Not Resuscitate.  GOALS OF CARE: Goals of care include to maximize quality of life and symptom management.   MOST selections include comfort measures antibiotics if indicated, no IV fluids, no feeding tube.  Both signed DNR and MOST form uploaded to [facility] record epic.  Visit consisted of counseling and education dealing with the complex  and emotionally intense issues of symptom management and palliative care in the setting of serious and potentially life-threatening illness. Palliative care team will continue to support patient, patient's family, and medical team.  I spent 20 minutes providing this initial consultation. More than 50% of the time in this consultation was spent on counseling patient and coordinating communication. --------------------------------------------------------------------------------------------------------------------------------------   RECOMMENDATIONS/PLAN:   Symptom management/Plan:  Seizure disorder: Continue Dilantin and Phenytoin as ordered. Seizure precautions.  Routine CBC CMP.  Dilantin level if needed. Shortness of breath: Oxygen dependent, related to diastolic congestive heart failure; continue with continuous oxygen supplementation  4L/Min. Adhere to salt/fluid limits to prevent fluid overload.  CHF: severe, requiring oxygen supplementation.  Education on need to use oxygen as ordered; reiterated on Fluid restriction 1516m/24 hours; patient reports he has significantly cut down drinking soda.  No added salt, Elevate BLE. Continue Torsemide as ordered. Ace wrap to right lower leg for edema.  Depression: Recurrent, continue with Venlafaxin.  Encourage socialization and participation in facility activities.  Psych following. Follow up: Palliative care will continue to follow for complex medical decision making, advance care planning, and clarification of goals. Return 6 weeks or prn. Encouraged to call provider sooner with any concerns.  CHIEF COMPLAINT: Palliative follow up  HISTORY OF PRESENT ILLNESS:  James MOROa 67y.o. male with multiple morbidities requiring close monitoring/management with high risk of complications and morbidity: Chronic diastolic CHF, chronic respiratory failure with hypoxia secondary to diastolic CHF right hemiplegia/hemiparesis secondary to CVA depression, A-fib,  seizure disorder. History obtained from review of EMR, discussion with primary team, family and/or patient.  Records reviewed and summarized above. All 10 point systems reviewed and are negative except as documented in history of present illness above Review and summarization of Epic records shows history from other than patient.  Patient reports plan to go for cataract eye surgery at [facility] and wanted NP to explain it to his sister and answer questions.  NP called Vinnie Level and provided education on what cataract surgery is about; answered all her questions to her satisfaction. I reviewed, as needed, available labs, patient records, imaging, studies and related documents from the EMR.  Palliative Care was asked to follow this patient o help address complex decision making in the context of advance care planning and goals of care clarification.    PERTINENT MEDICATIONS:  Outpatient Encounter Medications as of 07/22/2022  Medication Sig   acetaminophen (TYLENOL) 325 MG tablet Take 650 mg by mouth every 6 (six) hours as needed for mild pain or headache.   Carboxymeth-Glycerin-Polysorb (REFRESH OPTIVE ADVANCED) 0.5-1-0.5 % SOLN Place 1 drop into both eyes in the morning, at noon, in the evening, and at bedtime. Wait 3-5 minutes between eye medications   cycloSPORINE (RESTASIS) 0.05 % ophthalmic emulsion Place 1 drop into both eyes 2 (two) times daily.   empagliflozin (JARDIANCE) 10 MG TABS tablet Take by mouth daily.   ezetimibe (ZETIA) 10 MG tablet Take 10 mg by mouth daily.   fish oil-omega-3 fatty acids 1000 MG capsule Take 1,000 mg by mouth 2 (two) times daily with a meal.   omeprazole (PRILOSEC) 20 MG capsule Take 20 mg by mouth daily.   OXYGEN Inhale into the lungs See admin instructions. Nightly 9p-8a   phenytoin (DILANTIN) 100 MG ER capsule Take 200 mg by mouth 2 (two) times daily.   phenytoin (DILANTIN) 50 MG tablet Chew 50 mg by mouth daily.    potassium chloride SA (KLOR-CON) 20 MEQ tablet Take 2 tablets (40 mEq total) by mouth daily.   rosuvastatin (CRESTOR) 40 MG tablet Take 40 mg by mouth at bedtime.   spironolactone (ALDACTONE) 25 MG tablet Take 1 tablet (25 mg total) by mouth daily.   torsemide (DEMADEX) 20 MG tablet Take 20 mg by mouth every other day.   torsemide (DEMADEX) 20 MG tablet Take 20 mg by mouth 2 (two) times daily.   traZODone (DESYREL) 50 MG tablet Take 50 mg by mouth at bedtime.   venlafaxine XR (EFFEXOR-XR) 37.5 MG 24 hr capsule Take 37.5 mg by mouth at bedtime.   warfarin (COUMADIN) 1 MG tablet Take 0.5 mg by mouth. Tues/Thur/Sat/Sun   warfarin (COUMADIN) 4 MG tablet Take 4 mg by mouth. take 1 tab daily on Mon/Wed/Fri and tab4.5 on other days   No facility-administered encounter medications on file as of 07/22/2022.    HOSPICE ELIGIBILITY/DIAGNOSIS: TBD  PAST MEDICAL HISTORY:  Past Medical History:  Diagnosis Date   Acute on chronic congestive heart failure (HCC)    Anemia, secondary    SECONDARY TO ACUTE BLOOD LOSS   Anxiety disorder    Bloody stool 08/29/2011   intermittent along with constipation.    CVA (cerebral vascular accident) ([place]) 2010   large left MCA stroke with right hemiparesis   Depression    DVT of lower extremity (deep venous thrombosis) ([place])    RIGHT LOWER; s/p IVC filter 7/12   Dysphagia    Hyperlipidemia    Hypertension    Lupus anticoagulant disorder ([place]) 1990   Morbid obesity (HCC)    PFO (patent foramen  ovale)    TEE 2/10: EF 60%, trivial AI, mild Ao root dilatation, mod PFO with R-L shunting, atrial septal aneurysm;   echo 7/12: EF 65-70%, grade 1 diast dysfxn, mild MR, LVOT showed severe obstruction   Protein C deficiency (HCC)    Protein S deficiency (HCC)    Rectus sheath hematoma 7/12   required reversal of anticoagulation and c/b DVT req. IVC filter   Seizure disorder (HCC)      ALLERGIES:  Allergies  Allergen Reactions   Fenofibrate Other (See Comments)     Per MAR      Thank you for the opportunity to participate in the care of James Villegas Please call our office at [phone] if we can be of additional assistance.  Note: Portions of this note were generated with Lobbyist. Dictation errors may occur despite best attempts at proofreading.  Teodoro Spray, NP

## 2022-07-29 ENCOUNTER — Telehealth: Payer: Self-pay | Admitting: Nurse Practitioner

## 2022-07-29 NOTE — Telephone Encounter (Signed)
Spoke with the receptionist at [facility] and requested to speak with a Camera operator.   Left a message stating the patient has an appt schedule for Friday with Jaquelyn Bitter.    Hulen Skains back to see if I could speak with a charge nurse. I was transferred to the charge nurse and after the phone rang for 5 minutes a nurse answered.   Spoke with the nurse, gave her appt time, date and location of office with office name. She states she will make a note and patient will be at his appointment on Friday.

## 2022-07-29 NOTE — Telephone Encounter (Signed)
Patient called in regarding 2/23 appointment with Ambrose Pancoast, NP. He received the automated reminder call for the appointment and stated that this information needs to be relayed to the nursing facility, [facility], because they arrange all of his appointments. There is no mention of the appointment having been scheduled by the facility in the appointment notes. Patient stated that if they are not aware of the appointment then he will not come in. I asked if he had a phone number for their facility and he did not. I was able to look up the number online [phone]) and I ended the call with the patient. I called the [facility] and the phone rang for several minutes then I received an automated, "call cannot be completed.." I tried calling again and this time someone answered and transferred me to a nurses station's voicemail. I left a message with the appointment information and advised that they will need to call back to confirm or reschedule if necessary--FYI?

## 2022-07-31 DIAGNOSIS — F330 Major depressive disorder, recurrent, mild: Secondary | ICD-10-CM | POA: Diagnosis not present

## 2022-07-31 NOTE — Progress Notes (Addendum)
Office Visit    Patient Name: James Villegas Date of Encounter: 08/01/2022  Primary Care Provider:  Raymondo Band, MD Primary Cardiologist:  None Primary Electrophysiologist: None  Chief Complaint    James Villegas is a 67 y.o. 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  Past Medical History    Past Medical History:  Diagnosis Date   Acute on chronic congestive heart failure (HCC)    Anemia, secondary    SECONDARY TO ACUTE BLOOD LOSS   Anxiety disorder    Bloody stool 08/29/2011   intermittent along with constipation.    CV[place]re[place] vascular [place]) [place] [place]   large left MCA stroke with right hemiparesis   Depression    [place]    RIGHT LOWER; s/p IVC filter 7/12   Dysphagia    Hyperlipidemia    Hypertension    Lupus anticoagulant disorder ([facility] 1990   Morbid obesity (HCC)    PFO (patent foramen ovale)    TEE 2/10: EF 60%, trivial AI, mild Ao root dilatation, mod PFO with R-L shunting, atrial septal aneurysm;   echo 7/12: EF 65-70%, grade 1 diast dysfxn, mild MR, LVOT showed severe obstruction   Protein C deficiency (HCC)    Protein S deficiency (HCC)    Rectus sheath hematoma 7/12   required reversal of anticoagulation and c/b DVT req. IVC filter   Seizure disorder ([facility]    Past Surgical History:  Procedure Laterality Date   dental extraction     multiple   vena cavogram  12/2010   INFERIOR    Allergies  Allergies  Allergen Reactions   Fenofibrate Other (See Comments)    Per MAR    History of Present Illness    James Villegas is a 67year old male with the above mention past medical history who presents today for 258-monthollow-up of  congestive heart failure.  James Villegas previously seen in 2012 by Dr. CoBurt Knackor discussion of PFO closure following left MCA stroke in 2010.  He was lost to follow-up until 2020 when he was seen by Dr. NeMeda Coffeeuring a admission for CHF exacerbation and atrial flutter new onset in the setting of hypercapnic respiratory failure.  2D echo was completed showing normal LV systolic function with mildly dilated LA.  He was started on IV Lasix with 18.4 L diuresed and plans to manage PFO medically.  [facility]  [facility] to altered mental status and hypoxia.  He was found to be hypoxic at 50% on room air and was treated with IV Lasix and DuoNeb with improvement.  Patient's BNP was elevated at 960 and he diuresed 4.3 L and was placed in [place] boots.  2D echo was completed with EF of 65-70% and no RWMA with severe eccentric LVH normal RV systolic function with moderately elevated pulmonary artery systolic pressure moderately dilated ascending aortic aneurysm of 47 mm.  Patient had improvement to his breathing and was discharged on Demadex 20 mg twice daily and was scheduled for outpatient sleep study.  He was also scheduled for follow-up CT scan due to aortic aneurysm.  He was last seen on  01/23/2022 for overdue follow-up.  He was recently seen by palliative care 10/18/2021 and was made a DNR.  During his follow-up patient had gained 10 pounds and was off of his metoprolol for reduction to his torsemide dose.  He was currently taking Jardiance 10 mg participating in OT for 1 hour/day.  Patient's torsemide was increased to 20 mg twice daily due to lower extremity swelling sleep study was unable to be done due to body habitus.  Patient would require a Hoyer lift and was referred to [facility] for further management.  James Villegas today for 43-monthfollow-up of congestive heart failure along.  Since last being seen in the office patient reports that he has not  had any new cardiac complaints and blood pressure today is well-controlled at 112/62 and heart rate is 52 bpm.  EKG was obtained and patient is in permanent atrial fibrillation with controlled rate.  He is tolerating his medications without any adverse reactions.  He is euvolemic with chronic lower extremity swelling and weights have been maintained since his previous visit in November with no increase per our scales.  He reports that his facility minimizes and moderates his salt intake.  He has good urinary output with his Demadex 20 mg twice daily.  His current GDMT is hindered by his bradycardia.  He has been able to complete a sleep study due to body habitus and accommodating facilities.  We will order a home sleep study today for restratification and diagnosis of possible sleep apnea..  Patient denies chest pain, palpitations, dyspnea, PND, orthopnea, nausea, vomiting, dizziness, syncope, edema, weight gain, or early satiety.  Home Medications    Current Outpatient Medications  Medication Sig Dispense Refill   acetaminophen (TYLENOL) 325 MG tablet Take 650 mg by mouth every 6 (six) hours as needed for mild pain or headache.     Carboxymeth-Glycerin-Polysorb (REFRESH OPTIVE ADVANCED) 0.5-1-0.5 % SOLN Place 1 drop into both eyes in the morning, at noon, in the evening, and at bedtime. Wait 3-5 minutes between eye medications     cycloSPORINE (RESTASIS) 0.05 % ophthalmic emulsion Place 1 drop into both eyes 2 (two) times daily.     empagliflozin (JARDIANCE) 10 MG TABS tablet Take by mouth daily.     ezetimibe (ZETIA) 10 MG tablet Take 10 mg by mouth daily.     fish oil-omega-3 fatty acids 1000 MG capsule Take 1,000 mg by mouth 2 (two) times daily with a meal.     omeprazole (PRILOSEC) 20 MG capsule Take 20 mg by mouth daily.     OXYGEN Inhale into the lungs See admin instructions. Nightly 9p-8a     phenytoin (DILANTIN) 100 MG ER capsule Take 200 mg by mouth 2 (two) times daily.     phenytoin  (DILANTIN) 50 MG tablet Chew 50 mg by mouth daily.     potassium chloride SA (KLOR-CON) 20 MEQ tablet Take 2 tablets (40 mEq total) by mouth daily. 30 tablet 0   rosuvastatin (CRESTOR) 40 MG tablet Take 40 mg by mouth at bedtime.     torsemide (DEMADEX) 20 MG tablet Take 20 mg by mouth 2 (two) times daily.     traZODone (DESYREL) 50 MG tablet Take 50 mg by mouth at bedtime.     venlafaxine XR (EFFEXOR-XR) 37.5 MG 24 hr capsule Take 37.5 mg by mouth at bedtime.     warfarin (COUMADIN) 1 MG tablet Take 0.5 mg by mouth. Tues/Thur/Sat/Sun     warfarin (COUMADIN) 4 MG tablet Take 4  mg by mouth. take 1 tab daily on Mon/Wed/Fri and tab4.5 on other days     losartan (COZAAR) 25 MG tablet Take 25 mg by mouth daily. Pt takes 0.5 mg daily     spironolactone (ALDACTONE) 25 MG tablet Take 1 tablet (25 mg total) by mouth daily. 60 tablet 0   No current facility-administered medications for this visit.     Review of Systems  Please see the history of present illness.    (+) Chronic lower extremity edema (+) Increase shortness of breath with activity.  All other systems reviewed and are otherwise negative except as noted above.  Physical Exam    Wt Readings from Last 3 Encounters:  08/01/22 (!) 302 lb (137 kg)  01/23/22 (!) 302 lb (137 kg)  09/21/21 294 lb 15.6 oz (133.8 kg)   VS: Vitals:   08/01/22 0856  BP: 112/62  Pulse: (!) 52  SpO2: 96%  ,Body mass index is 39.84 kg/m.  Constitutional:      Appearance: Healthy appearance. Not in distress.  Neck:     Vascular: JVD normal.  Pulmonary:     Effort: Pulmonary effort is normal.     Breath sounds: No wheezing. No rales. Diminished in the bases Cardiovascular:     Irregularly irregular normal S1. Normal S2.      Murmurs: There is no murmur.  Edema:    Peripheral edema absent.  Abdominal:     Palpations: Abdomen is soft non tender. There is no hepatomegaly.  Skin:    General: Skin is warm and dry.  Neurological:     General: No  focal deficit present.     Mental Status: Alert and oriented to person, place and time.     Cranial Nerves: Cranial nerves are intact.  EKG/LABS/Other Studies Reviewed    ECG personally reviewed by me today -atrial fibrillation with slow ventricular response and right bundle branch block with rate of 52 bpm no acute changes consistent with previous EKG.  Risk Assessment/Calculations:    CHA2DS2-VASc Score = 4   This indicates a 4.8% annual risk of stroke. The patient's score is based upon: CHF History: 1 HTN History: 0 Diabetes History: 0 Stroke History: 2 Vascular Disease History: 1 Age Score: 0 Gender Score: 0     STOP-Bang Score:  7        Lab Results  Component Value Date   WBC 8.9 09/19/2021   HGB 11.8 (L) 09/19/2021   HCT 37.5 (L) 09/19/2021   MCV 98.2 09/19/2021   PLT 227 09/19/2021   Lab Results  Component Value Date   CREATININE 0.52 (L) 09/21/2021   BUN 6 (L) 09/21/2021   NA 141 09/21/2021   K 3.4 (L) 09/21/2021   CL 95 (L) 09/21/2021   CO2 41 (H) 09/21/2021   Lab Results  Component Value Date   ALT 15 09/16/2021   AST 20 09/16/2021   ALKPHOS 108 09/16/2021   BILITOT 1.1 09/16/2021   Lab Results  Component Value Date   CHOL 207 (H) 07/15/2019   HDL 60 07/15/2019   LDLCALC 122 (H) 07/15/2019   TRIG 91 07/30/2020   CHOLHDL 3.5 07/15/2019    Lab Results  Component Value Date   HGBA1C 5.4 12/16/2010    Assessment & Plan     Addendum: I connected with James Villegas and provided an update as she is his power of attorney.  She had no further questions and was very thankful for the update.  I advised her to contact our office if she had any further questions at this time.  1.  HFpEF: -2D echo completed 09/2021 with hyperdynamic EF of 70-75% and no RWMA with severe hypertrophy of the LV moderately dilated LA with trivial MVR -Today patient is euvolemic with chronic lower extremity swelling. -Continue current GDMT with Jardiance  10 mg daily, spironolactone 25 mg daily, torsemide milligrams daily -Low sodium diet, fluid restriction <2L, and daily weights encouraged. Educated to contact our office for weight gain of 2 lbs overnight or 5 lbs in one week.   2.  History of CVA: -s/p left MCA stroke 2010 with residual right hemiparesis -Continue GDMT with Zetia 10 mg daily and rosuvastatin 40 mg daily -Continue Coumadin per Coumadin clinic  3.  Ascending aortic aneurysm: -2D echo from 2023 showing aneurysm diameter with reduction from 47 mm to 45 mm. -Patient will have repeat surveillance 2D echo in 6 months  4.  Permanent atrial fibrillation: -Today patient's rate is controlled at 52 bpm with slow ventricular response -Patient reports no occurrences of palpitations or bouts of tachycardia. -Continue Coumadin per Coumadin clinic. -CHA2DS2-VASc Score = 4 [CHF History: 1, HTN History: 0, Diabetes History: 0, Stroke History: 2, Vascular Disease History: 1, Age Score: 0, Gender Score: 0].  Therefore, the patient's annual risk of stroke is 4.8 %.      5.  Obesity hypoventilation syndrome: -Patient's BMI is 39.84 kg/m  -Currently followed by Dr. Halford Chessman   6.  History of sleep disturbance: -Patient with previous request for sleep study however unable to have complete due to body habitus and no available left at facility. -Patient will have home sleep study completed for STOP-BANG of 7  Sleep Apnea Evaluation  [place]  Today's Date: 08/01/2022   Patient Name: James Villegas        DOB: June 26, 1955       Height:  '6\' 1"'$  (1.854 m)     Weight: (!) 302 lb (137 kg)  BMI: Body mass index is 39.84 kg/m.    Referring Provider: Ambrose Pancoast, NP   STOP-BANG RISK ASSESSMENT       08/01/2022    9:44 AM  STOP-BANG  Do you snore loudly? Yes  Do you often feel tired, fatigued, or sleepy during the daytime? Yes  Has anyone observed you stop breathing during sleep? No  Do you have (or are you being  treated for) high blood pressure? Yes  Recent BMI (Calculated) 39.85  Is BMI greater than 35 kg/m2? 1=Yes  Age older than 65 years old? 1=Yes  Has large neck size > 40 cm (15.7 in, large male shirt size, large male collar size > 16) Yes  Gender - Male 1=Yes  STOP-Bang Total Score 7      If STOP-BANG Score ?3 OR two clinical symptoms - patient qualifies for WatchPAT (CPT 95800)      Sleep study ordered due to two (2) of the following clinical symptoms/diagnoses:  Excessive daytime sleepiness G47.10  Gastroesophageal reflux K21.9  Nocturia R35.1  Morning Headaches G44.221  Difficulty concentrating R41.840  Memory problems or poor judgment G31.84  Personality changes or irritability R45.4  Loud snoring R06.83  Depression F32.9  Unrefreshed by sleep G47.8  Impotence N52.9  History of high blood pressure R03.0  Insomnia G47.00  Sleep Disordered Breathing or Sleep Apnea ICD G47.33     Disposition: Follow-up with None or APP in 6 months   Medication Adjustments/Labs and Tests Ordered:  Current medicines are reviewed at length with the patient today.  Concerns regarding medicines are outlined above.   Signed, Mable Fill, Marissa Nestle, NP 08/01/2022, 10:28 AM [facility]  Note:  This document was prepared using Dragon voice recognition software and may include unintentional dictation errors.

## 2022-08-01 ENCOUNTER — Telehealth: Payer: Self-pay | Admitting: *Deleted

## 2022-08-01 ENCOUNTER — Telehealth: Payer: Self-pay | Admitting: Nurse Practitioner

## 2022-08-01 ENCOUNTER — Encounter: Payer: Self-pay | Admitting: Nurse Practitioner

## 2022-08-01 ENCOUNTER — Ambulatory Visit: Payer: Medicare Other | Attending: Nurse Practitioner | Admitting: Nurse Practitioner

## 2022-08-01 VITALS — BP 112/62 | HR 52 | Ht 73.0 in | Wt 302.0 lb

## 2022-08-01 DIAGNOSIS — I7121 Aneurysm of the ascending aorta, without rupture: Secondary | ICD-10-CM | POA: Diagnosis not present

## 2022-08-01 DIAGNOSIS — I639 Cerebral infarction, unspecified: Secondary | ICD-10-CM | POA: Insufficient documentation

## 2022-08-01 DIAGNOSIS — E662 Morbid (severe) obesity with alveolar hypoventilation: Secondary | ICD-10-CM | POA: Diagnosis not present

## 2022-08-01 DIAGNOSIS — Q2112 Patent foramen ovale: Secondary | ICD-10-CM | POA: Diagnosis not present

## 2022-08-01 DIAGNOSIS — I5032 Chronic diastolic (congestive) heart failure: Secondary | ICD-10-CM | POA: Diagnosis not present

## 2022-08-01 DIAGNOSIS — I4821 Permanent atrial fibrillation: Secondary | ICD-10-CM | POA: Insufficient documentation

## 2022-08-01 DIAGNOSIS — G479 Sleep disorder, unspecified: Secondary | ICD-10-CM | POA: Insufficient documentation

## 2022-08-01 LAB — CBC

## 2022-08-01 NOTE — Telephone Encounter (Signed)
Ambrose Pancoast, NP ordered an James Villegas study today while the pt was in the office. Pt agreeable to plan of care for and not open the box until he has been called with the PIN#.

## 2022-08-01 NOTE — Patient Instructions (Addendum)
Medication Instructions:  Your physician recommends that you continue on your current medications as directed. Please refer to the Current Medication list given to you today. *If you need a refill on your cardiac medications before your next appointment, please call your pharmacy*   Lab Work: TODAY-BMET & CBC If you have labs (blood work) drawn today and your tests are completely normal, you will receive your results only by: [place] (if you have MyChart) OR A paper copy in the mail If you have any lab test that is abnormal or we need to change your treatment, we will call you to review the results.   Testing/Procedures: [place]amar   Follow-Up: At [facility], you and your health needs are our priority.  As part of our continuing mission to provide you with exceptional heart care, we have created designated Provider Care Teams.  These Care Teams include your primary Cardiologist (physician) and Advanced Practice Providers (APPs -  Physician Assistants and Nurse Practitioners) who all work together to provide you with the care you need, when you need it.  We recommend signing up for the patient portal called "MyChart".  Sign up information is provided on this After Visit Summary.  MyChart is used to connect with patients for Virtual Visits (Telemedicine).  Patients are able to view lab/test results, encounter notes, upcoming appointments, etc.  Non-urgent messages can be sent to your provider as well.   To learn more about what you can do with MyChart, go to [email or web address].    Your next appointment:   6 month(s)  Provider:   Freada Bergeron, MD    Other Instructions

## 2022-08-01 NOTE — Telephone Encounter (Signed)
Facility is calling with question concerning the patient appt he had today. When calling ask for the patient nurse. Please advise advise

## 2022-08-01 NOTE — Telephone Encounter (Signed)
Attempted to reach the facility two times but no answer and unable to reach anyone or unable to leave a message.

## 2022-08-01 NOTE — Telephone Encounter (Signed)
Attempted to reach out to facility again but no answer.

## 2022-08-02 LAB — BASIC METABOLIC PANEL
BUN/Creatinine Ratio: 20 (ref 10–24)
BUN: 12 mg/dL (ref 8–27)
CO2: 38 mmol/L — ABNORMAL HIGH (ref 20–29)
Calcium: 9 mg/dL (ref 8.6–10.2)
Chloride: 94 mmol/L — ABNORMAL LOW (ref 96–106)
Creatinine, Ser: 0.6 mg/dL — ABNORMAL LOW (ref 0.76–1.27)
Glucose: 107 mg/dL — ABNORMAL HIGH (ref 70–99)
Potassium: 4.5 mmol/L (ref 3.5–5.2)
Sodium: 143 mmol/L (ref 134–144)
eGFR: 108 mL/min/{1.73_m2} (ref >59)

## 2022-08-02 LAB — CBC
Hematocrit: 39.2 % (ref 37.5–51.0)
Hemoglobin: 13.2 g/dL (ref 13.0–17.7)
MCH: 31 pg (ref 26.6–33.0)
MCHC: 33.7 g/dL (ref 31.5–35.7)
MCV: 92 fL (ref 79–97)
Platelets: 227 10*3/uL (ref 150–450)
RBC: 4.26 x10E6/uL (ref 4.14–5.80)
RDW: 12.9 % (ref 11.6–15.4)
WBC: 6.1 10*3/uL (ref 3.4–10.8)

## 2022-08-04 DIAGNOSIS — I69351 Hemiplegia and hemiparesis following cerebral infarction affecting right dominant side: Secondary | ICD-10-CM | POA: Diagnosis not present

## 2022-08-04 DIAGNOSIS — M24541 Contracture, right hand: Secondary | ICD-10-CM | POA: Diagnosis not present

## 2022-08-04 DIAGNOSIS — I5022 Chronic systolic (congestive) heart failure: Secondary | ICD-10-CM | POA: Diagnosis not present

## 2022-08-04 NOTE — Telephone Encounter (Signed)
Left detailed message for the facility to contact the office.   Left message on the patients voicemail requesting he have the facility contact the office.

## 2022-08-04 NOTE — Telephone Encounter (Signed)
Received a call back from the nurse who states the patient told her we requested someone from the facility contact our office.   I advised her that the patient was seen in the office recently and I was returning a call. I also informed her that I was trying to reach a nurse in their facility send them a copy of the patients most recent lab results with Ernest's instructions.   She states she never got the multiple messages I left and was calling to follow up for the patient.   Fax number given to send over a copy of the labs and recommendations.  [phone].

## 2022-08-04 NOTE — Telephone Encounter (Signed)
I received a message that [facility] was calling about Itamar sleep study. I called the facility back and was transferred but no answered. What I understood from the message that I received was SNF was calling about the PIN#.

## 2022-08-04 NOTE — Telephone Encounter (Signed)
Prior Authorization for [facility] sent to MEDICARE via Phone. Reference # . NO PA REQ

## 2022-08-04 NOTE — Telephone Encounter (Signed)
ADDENDUM: phone # for SNF is [phone]

## 2022-08-05 DIAGNOSIS — I5022 Chronic systolic (congestive) heart failure: Secondary | ICD-10-CM | POA: Diagnosis not present

## 2022-08-05 DIAGNOSIS — M24541 Contracture, right hand: Secondary | ICD-10-CM | POA: Diagnosis not present

## 2022-08-05 DIAGNOSIS — I69351 Hemiplegia and hemiparesis following cerebral infarction affecting right dominant side: Secondary | ICD-10-CM | POA: Diagnosis not present

## 2022-08-05 NOTE — Telephone Encounter (Signed)
I did s/w the pt and told him the sleep study has been approved. He asked me to call the [facility] [address] and s/w the nurse to let them know so they can help him set up the sleep study.   I called the [facility], the phone a multitude of times and then disconnected.

## 2022-08-06 DIAGNOSIS — M24541 Contracture, right hand: Secondary | ICD-10-CM | POA: Diagnosis not present

## 2022-08-06 DIAGNOSIS — I69351 Hemiplegia and hemiparesis following cerebral infarction affecting right dominant side: Secondary | ICD-10-CM | POA: Diagnosis not present

## 2022-08-06 DIAGNOSIS — I5022 Chronic systolic (congestive) heart failure: Secondary | ICD-10-CM | POA: Diagnosis not present

## 2022-08-07 DIAGNOSIS — I5022 Chronic systolic (congestive) heart failure: Secondary | ICD-10-CM | POA: Diagnosis not present

## 2022-08-07 DIAGNOSIS — M24541 Contracture, right hand: Secondary | ICD-10-CM | POA: Diagnosis not present

## 2022-08-07 DIAGNOSIS — I69351 Hemiplegia and hemiparesis following cerebral infarction affecting right dominant side: Secondary | ICD-10-CM | POA: Diagnosis not present

## 2022-08-07 NOTE — Telephone Encounter (Signed)
I have tried a number of times to reach nursing staff at [facility] as it was brought to my attention that they need to know how to use the Itamar study. I cannot ever get someone to answer the [facility] phone. The phone will ring multiple times then hang up. I will let Ambrose Pancoast, NP know. Maybe the type of sleep study may need to be changed to in lab, as kept falling asleep while in the office the day I was going over the set up instructions. Pt not able to carry out the study on his own without help. I cannot reach any one at SNF.

## 2022-08-07 NOTE — Telephone Encounter (Signed)
I s/w DPR on file, pt's sister Manuela Schwartz. I d/w her about the sleep study that we gave her brother (the pt) the other day when he was in the office with Ambrose Pancoast, NP. I explained that I have been trying to reach a nurse on the [facility] at [facility] but no one ever answers. She tells me that she has a very hard tim getting anyone at the SNF to answer the phone. Manuela Schwartz was given the information about the sleep study and I gave her the PIN# [id number] as pt has been approved. See previous notes. I have asked Manuela Schwartz if she could please let me know if she need when the nursing staff can help him do the sleep study. I advised to have done ASAP so we let her know what results and recommendations are. Manuela Schwartz thanked me for the help in this matter. I gave her my direct line [phone], ok to leave vm if needed.

## 2022-08-08 DIAGNOSIS — M24541 Contracture, right hand: Secondary | ICD-10-CM | POA: Diagnosis not present

## 2022-08-08 DIAGNOSIS — I5022 Chronic systolic (congestive) heart failure: Secondary | ICD-10-CM | POA: Diagnosis not present

## 2022-08-08 DIAGNOSIS — I69351 Hemiplegia and hemiparesis following cerebral infarction affecting right dominant side: Secondary | ICD-10-CM | POA: Diagnosis not present

## 2022-08-11 DIAGNOSIS — I69351 Hemiplegia and hemiparesis following cerebral infarction affecting right dominant side: Secondary | ICD-10-CM | POA: Diagnosis not present

## 2022-08-11 DIAGNOSIS — M24541 Contracture, right hand: Secondary | ICD-10-CM | POA: Diagnosis not present

## 2022-08-11 DIAGNOSIS — I5022 Chronic systolic (congestive) heart failure: Secondary | ICD-10-CM | POA: Diagnosis not present

## 2022-08-12 DIAGNOSIS — I5022 Chronic systolic (congestive) heart failure: Secondary | ICD-10-CM | POA: Diagnosis not present

## 2022-08-12 DIAGNOSIS — I10 Essential (primary) hypertension: Secondary | ICD-10-CM | POA: Diagnosis not present

## 2022-08-12 DIAGNOSIS — I5040 Unspecified combined systolic (congestive) and diastolic (congestive) heart failure: Secondary | ICD-10-CM | POA: Diagnosis not present

## 2022-08-12 DIAGNOSIS — M24541 Contracture, right hand: Secondary | ICD-10-CM | POA: Diagnosis not present

## 2022-08-12 DIAGNOSIS — I69351 Hemiplegia and hemiparesis following cerebral infarction affecting right dominant side: Secondary | ICD-10-CM | POA: Diagnosis not present

## 2022-08-13 DIAGNOSIS — I5022 Chronic systolic (congestive) heart failure: Secondary | ICD-10-CM | POA: Diagnosis not present

## 2022-08-13 DIAGNOSIS — M24541 Contracture, right hand: Secondary | ICD-10-CM | POA: Diagnosis not present

## 2022-08-13 DIAGNOSIS — I69351 Hemiplegia and hemiparesis following cerebral infarction affecting right dominant side: Secondary | ICD-10-CM | POA: Diagnosis not present

## 2022-08-14 DIAGNOSIS — I69351 Hemiplegia and hemiparesis following cerebral infarction affecting right dominant side: Secondary | ICD-10-CM | POA: Diagnosis not present

## 2022-08-14 DIAGNOSIS — I5022 Chronic systolic (congestive) heart failure: Secondary | ICD-10-CM | POA: Diagnosis not present

## 2022-08-14 DIAGNOSIS — M24541 Contracture, right hand: Secondary | ICD-10-CM | POA: Diagnosis not present

## 2022-08-15 DIAGNOSIS — I69351 Hemiplegia and hemiparesis following cerebral infarction affecting right dominant side: Secondary | ICD-10-CM | POA: Diagnosis not present

## 2022-08-15 DIAGNOSIS — Z7901 Long term (current) use of anticoagulants: Secondary | ICD-10-CM | POA: Diagnosis not present

## 2022-08-15 DIAGNOSIS — M24541 Contracture, right hand: Secondary | ICD-10-CM | POA: Diagnosis not present

## 2022-08-15 DIAGNOSIS — I5022 Chronic systolic (congestive) heart failure: Secondary | ICD-10-CM | POA: Diagnosis not present

## 2022-08-18 DIAGNOSIS — E0800 Diabetes mellitus due to underlying condition with hyperosmolarity without nonketotic hyperglycemic-hyperosmolar coma (NKHHC): Secondary | ICD-10-CM | POA: Diagnosis not present

## 2022-08-18 DIAGNOSIS — I10 Essential (primary) hypertension: Secondary | ICD-10-CM | POA: Diagnosis not present

## 2022-08-18 DIAGNOSIS — R0602 Shortness of breath: Secondary | ICD-10-CM | POA: Diagnosis not present

## 2022-08-18 DIAGNOSIS — I69351 Hemiplegia and hemiparesis following cerebral infarction affecting right dominant side: Secondary | ICD-10-CM | POA: Diagnosis not present

## 2022-08-18 DIAGNOSIS — I5022 Chronic systolic (congestive) heart failure: Secondary | ICD-10-CM | POA: Diagnosis not present

## 2022-08-18 DIAGNOSIS — R0989 Other specified symptoms and signs involving the circulatory and respiratory systems: Secondary | ICD-10-CM | POA: Diagnosis not present

## 2022-08-18 DIAGNOSIS — M24541 Contracture, right hand: Secondary | ICD-10-CM | POA: Diagnosis not present

## 2022-08-19 ENCOUNTER — Inpatient Hospital Stay (HOSPITAL_COMMUNITY)
Admission: EM | Admit: 2022-08-19 | Discharge: 2022-08-26 | DRG: 189 | Disposition: A | Discharge status: Skilled Nursing Facility | Payer: Medicare Other | Source: Skilled Nursing Facility | Attending: Internal Medicine | Admitting: Internal Medicine

## 2022-08-19 ENCOUNTER — Emergency Department (HOSPITAL_COMMUNITY): Payer: Medicare Other

## 2022-08-19 DIAGNOSIS — Z7901 Long term (current) use of anticoagulants: Secondary | ICD-10-CM | POA: Diagnosis not present

## 2022-08-19 DIAGNOSIS — F331 Major depressive disorder, recurrent, moderate: Secondary | ICD-10-CM | POA: Diagnosis not present

## 2022-08-19 DIAGNOSIS — J9600 Acute respiratory failure, unspecified whether with hypoxia or hypercapnia: Secondary | ICD-10-CM | POA: Diagnosis not present

## 2022-08-19 DIAGNOSIS — I69322 Dysarthria following cerebral infarction: Secondary | ICD-10-CM

## 2022-08-19 DIAGNOSIS — I69351 Hemiplegia and hemiparesis following cerebral infarction affecting right dominant side: Secondary | ICD-10-CM | POA: Diagnosis not present

## 2022-08-19 DIAGNOSIS — G40909 Epilepsy, unspecified, not intractable, without status epilepticus: Secondary | ICD-10-CM | POA: Diagnosis present

## 2022-08-19 DIAGNOSIS — I4821 Permanent atrial fibrillation: Secondary | ICD-10-CM | POA: Diagnosis present

## 2022-08-19 DIAGNOSIS — J9622 Acute and chronic respiratory failure with hypercapnia: Secondary | ICD-10-CM | POA: Diagnosis present

## 2022-08-19 DIAGNOSIS — R569 Unspecified convulsions: Secondary | ICD-10-CM | POA: Diagnosis not present

## 2022-08-19 DIAGNOSIS — K219 Gastro-esophageal reflux disease without esophagitis: Secondary | ICD-10-CM | POA: Diagnosis not present

## 2022-08-19 DIAGNOSIS — Z7401 Bed confinement status: Secondary | ICD-10-CM | POA: Diagnosis not present

## 2022-08-19 DIAGNOSIS — Z7984 Long term (current) use of oral hypoglycemic drugs: Secondary | ICD-10-CM | POA: Diagnosis not present

## 2022-08-19 DIAGNOSIS — G9341 Metabolic encephalopathy: Secondary | ICD-10-CM | POA: Diagnosis present

## 2022-08-19 DIAGNOSIS — R0602 Shortness of breath: Secondary | ICD-10-CM | POA: Diagnosis not present

## 2022-08-19 DIAGNOSIS — D6859 Other primary thrombophilia: Secondary | ICD-10-CM | POA: Diagnosis not present

## 2022-08-19 DIAGNOSIS — I69391 Dysphagia following cerebral infarction: Secondary | ICD-10-CM | POA: Diagnosis not present

## 2022-08-19 DIAGNOSIS — Z79899 Other long term (current) drug therapy: Secondary | ICD-10-CM | POA: Diagnosis not present

## 2022-08-19 DIAGNOSIS — Z888 Allergy status to other drugs, medicaments and biological substances status: Secondary | ICD-10-CM | POA: Diagnosis not present

## 2022-08-19 DIAGNOSIS — Z66 Do not resuscitate: Secondary | ICD-10-CM | POA: Diagnosis present

## 2022-08-19 DIAGNOSIS — I482 Chronic atrial fibrillation, unspecified: Secondary | ICD-10-CM | POA: Diagnosis not present

## 2022-08-19 DIAGNOSIS — Z87891 Personal history of nicotine dependence: Secondary | ICD-10-CM | POA: Diagnosis not present

## 2022-08-19 DIAGNOSIS — G459 Transient cerebral ischemic attack, unspecified: Secondary | ICD-10-CM | POA: Diagnosis not present

## 2022-08-19 DIAGNOSIS — Z9981 Dependence on supplemental oxygen: Secondary | ICD-10-CM | POA: Diagnosis not present

## 2022-08-19 DIAGNOSIS — J9602 Acute respiratory failure with hypercapnia: Secondary | ICD-10-CM | POA: Diagnosis not present

## 2022-08-19 DIAGNOSIS — E662 Morbid (severe) obesity with alveolar hypoventilation: Secondary | ICD-10-CM | POA: Diagnosis not present

## 2022-08-19 DIAGNOSIS — Q2112 Patent foramen ovale: Secondary | ICD-10-CM

## 2022-08-19 DIAGNOSIS — M329 Systemic lupus erythematosus, unspecified: Secondary | ICD-10-CM | POA: Diagnosis present

## 2022-08-19 DIAGNOSIS — R112 Nausea with vomiting, unspecified: Secondary | ICD-10-CM | POA: Diagnosis present

## 2022-08-19 DIAGNOSIS — Z86718 Personal history of other venous thrombosis and embolism: Secondary | ICD-10-CM | POA: Diagnosis not present

## 2022-08-19 DIAGNOSIS — I5032 Chronic diastolic (congestive) heart failure: Secondary | ICD-10-CM | POA: Diagnosis not present

## 2022-08-19 DIAGNOSIS — I7121 Aneurysm of the ascending aorta, without rupture: Secondary | ICD-10-CM | POA: Diagnosis present

## 2022-08-19 DIAGNOSIS — I5022 Chronic systolic (congestive) heart failure: Secondary | ICD-10-CM | POA: Diagnosis not present

## 2022-08-19 DIAGNOSIS — Z8673 Personal history of transient ischemic attack (TIA), and cerebral infarction without residual deficits: Secondary | ICD-10-CM

## 2022-08-19 DIAGNOSIS — Z8616 Personal history of COVID-19: Secondary | ICD-10-CM | POA: Diagnosis not present

## 2022-08-19 DIAGNOSIS — J9601 Acute respiratory failure with hypoxia: Secondary | ICD-10-CM | POA: Diagnosis not present

## 2022-08-19 DIAGNOSIS — J9620 Acute and chronic respiratory failure, unspecified whether with hypoxia or hypercapnia: Secondary | ICD-10-CM | POA: Diagnosis not present

## 2022-08-19 DIAGNOSIS — D6862 Lupus anticoagulant syndrome: Secondary | ICD-10-CM | POA: Diagnosis present

## 2022-08-19 DIAGNOSIS — I509 Heart failure, unspecified: Secondary | ICD-10-CM | POA: Diagnosis not present

## 2022-08-19 DIAGNOSIS — E875 Hyperkalemia: Secondary | ICD-10-CM | POA: Diagnosis not present

## 2022-08-19 DIAGNOSIS — I5033 Acute on chronic diastolic (congestive) heart failure: Secondary | ICD-10-CM | POA: Diagnosis present

## 2022-08-19 DIAGNOSIS — J9621 Acute and chronic respiratory failure with hypoxia: Principal | ICD-10-CM

## 2022-08-19 DIAGNOSIS — N390 Urinary tract infection, site not specified: Secondary | ICD-10-CM | POA: Diagnosis not present

## 2022-08-19 DIAGNOSIS — E785 Hyperlipidemia, unspecified: Secondary | ICD-10-CM | POA: Diagnosis present

## 2022-08-19 DIAGNOSIS — Z1152 Encounter for screening for COVID-19: Secondary | ICD-10-CM

## 2022-08-19 DIAGNOSIS — J9612 Chronic respiratory failure with hypercapnia: Secondary | ICD-10-CM | POA: Diagnosis not present

## 2022-08-19 DIAGNOSIS — R531 Weakness: Secondary | ICD-10-CM | POA: Diagnosis not present

## 2022-08-19 DIAGNOSIS — I110 Hypertensive heart disease with heart failure: Secondary | ICD-10-CM | POA: Diagnosis present

## 2022-08-19 DIAGNOSIS — F39 Unspecified mood [affective] disorder: Secondary | ICD-10-CM | POA: Diagnosis not present

## 2022-08-19 DIAGNOSIS — J90 Pleural effusion, not elsewhere classified: Secondary | ICD-10-CM | POA: Diagnosis not present

## 2022-08-19 DIAGNOSIS — J9610 Chronic respiratory failure, unspecified whether with hypoxia or hypercapnia: Secondary | ICD-10-CM | POA: Diagnosis not present

## 2022-08-19 DIAGNOSIS — S82301D Unspecified fracture of lower end of right tibia, subsequent encounter for closed fracture with routine healing: Secondary | ICD-10-CM | POA: Diagnosis not present

## 2022-08-19 DIAGNOSIS — R0902 Hypoxemia: Secondary | ICD-10-CM | POA: Diagnosis not present

## 2022-08-19 DIAGNOSIS — J811 Chronic pulmonary edema: Secondary | ICD-10-CM | POA: Diagnosis not present

## 2022-08-19 LAB — COMPREHENSIVE METABOLIC PANEL
ALT: 13 U/L (ref 0–44)
AST: 11 U/L — ABNORMAL LOW (ref 15–41)
Albumin: 2.5 g/dL — ABNORMAL LOW (ref 3.5–5.0)
Alkaline Phosphatase: 87 U/L (ref 38–126)
Anion gap: 10 (ref 5–15)
BUN: 8 mg/dL (ref 8–23)
CO2: 32 mmol/L (ref 22–32)
Calcium: 7.3 mg/dL — ABNORMAL LOW (ref 8.9–10.3)
Chloride: 102 mmol/L (ref 98–111)
Creatinine, Ser: 0.48 mg/dL — ABNORMAL LOW (ref 0.61–1.24)
GFR, Estimated: >60 mL/min (ref >60)
Glucose, Bld: 133 mg/dL — ABNORMAL HIGH (ref 70–99)
Potassium: 3.6 mmol/L (ref 3.5–5.1)
Sodium: 144 mmol/L (ref 135–145)
Total Bilirubin: 0.2 mg/dL — ABNORMAL LOW (ref 0.3–1.2)
Total Protein: 5.1 g/dL — ABNORMAL LOW (ref 6.5–8.1)

## 2022-08-19 LAB — BRAIN NATRIURETIC PEPTIDE
B Natriuretic Peptide: 233.8 pg/mL — ABNORMAL HIGH (ref 0.0–100.0)
B Natriuretic Peptide: 219 pg/mL — ABNORMAL HIGH (ref 0.0–100.0)

## 2022-08-19 LAB — BLOOD GAS, ARTERIAL
Acid-Base Excess: 13.1 mmol/L — ABNORMAL HIGH (ref 0.0–2.0)
Bicarbonate: 48.1 mmol/L — ABNORMAL HIGH (ref 20.0–28.0)
O2 Saturation: 99.3 %
Patient temperature: 36.6
pCO2 arterial: >123 mmHg (ref 32–48)
pH, Arterial: 7.17 — CL (ref 7.35–7.45)
pO2, Arterial: 115 mmHg — ABNORMAL HIGH (ref 83–108)

## 2022-08-19 LAB — PROTIME-INR
INR: 2.2 — ABNORMAL HIGH (ref 0.8–1.2)
Prothrombin Time: 24.3 seconds — ABNORMAL HIGH (ref 11.4–15.2)

## 2022-08-19 LAB — TROPONIN I (HIGH SENSITIVITY)
Troponin I (High Sensitivity): 21 ng/L — ABNORMAL HIGH (ref <18)
Troponin I (High Sensitivity): 20 ng/L — ABNORMAL HIGH (ref <18)

## 2022-08-19 LAB — CBC WITH DIFFERENTIAL/PLATELET
Abs Immature Granulocytes: 0.08 10*3/uL — ABNORMAL HIGH (ref 0.00–0.07)
Basophils Absolute: 0 10*3/uL (ref 0.0–0.1)
Basophils Relative: 0 %
Eosinophils Absolute: 0 10*3/uL (ref 0.0–0.5)
Eosinophils Relative: 0 %
HCT: 37.5 % — ABNORMAL LOW (ref 39.0–52.0)
Hemoglobin: 11.4 g/dL — ABNORMAL LOW (ref 13.0–17.0)
Immature Granulocytes: 1 %
Lymphocytes Relative: 4 %
Lymphs Abs: 0.4 10*3/uL — ABNORMAL LOW (ref 0.7–4.0)
MCH: 31 pg (ref 26.0–34.0)
MCHC: 30.4 g/dL (ref 30.0–36.0)
MCV: 101.9 fL — ABNORMAL HIGH (ref 80.0–100.0)
Monocytes Absolute: 0.8 10*3/uL (ref 0.1–1.0)
Monocytes Relative: 10 %
Neutro Abs: 7.5 10*3/uL (ref 1.7–7.7)
Neutrophils Relative %: 85 %
Platelets: 182 10*3/uL (ref 150–400)
RBC: 3.68 MIL/uL — ABNORMAL LOW (ref 4.22–5.81)
RDW: 13.7 % (ref 11.5–15.5)
WBC: 8.8 10*3/uL (ref 4.0–10.5)
nRBC: 0 % (ref 0.0–0.2)

## 2022-08-19 LAB — RESP PANEL BY RT-PCR (RSV, FLU A&B, COVID)  RVPGX2
Influenza A by PCR: NEGATIVE
Influenza B by PCR: NEGATIVE
Resp Syncytial Virus by PCR: NEGATIVE
SARS Coronavirus 2 by RT PCR: NEGATIVE

## 2022-08-19 LAB — GLUCOSE, CAPILLARY
Glucose-Capillary: 119 mg/dL — ABNORMAL HIGH (ref 70–99)
Glucose-Capillary: 90 mg/dL (ref 70–99)

## 2022-08-19 LAB — PHENYTOIN LEVEL, TOTAL: Phenytoin Lvl: 16.7 ug/mL (ref 10.0–20.0)

## 2022-08-19 LAB — PROCALCITONIN: Procalcitonin: <0.1 ng/mL

## 2022-08-19 MED ORDER — SODIUM CHLORIDE 0.9 % IV SOLN
500.0000 mg | Freq: Once | INTRAVENOUS | Status: AC
  Administered 2022-08-19: 500 mg via INTRAVENOUS
  Filled 2022-08-19: qty 5

## 2022-08-19 MED ORDER — ORAL CARE MOUTH RINSE
15.0000 mL | OROMUCOSAL | Status: DC
  Administered 2022-08-19 – 2022-08-26 (×23): 15 mL via OROMUCOSAL

## 2022-08-19 MED ORDER — SODIUM CHLORIDE 0.9 % IV SOLN
2.0000 g | INTRAVENOUS | Status: DC
  Administered 2022-08-19: 2 g via INTRAVENOUS
  Filled 2022-08-19: qty 20

## 2022-08-19 MED ORDER — ORAL CARE MOUTH RINSE: 15.0000 mL | OROMUCOSAL | Status: DC | PRN

## 2022-08-19 MED ORDER — HEPARIN SODIUM (PORCINE) 5000 UNIT/ML IJ SOLN
5000.0000 [IU] | Freq: Three times a day (TID) | INTRAMUSCULAR | Status: DC
  Administered 2022-08-19 – 2022-08-20 (×3): 5000 [IU] via SUBCUTANEOUS
  Filled 2022-08-19 (×3): qty 1

## 2022-08-19 MED ORDER — FUROSEMIDE 10 MG/ML IJ SOLN
40.0000 mg | Freq: Once | INTRAMUSCULAR | Status: AC
  Administered 2022-08-19: 40 mg via INTRAVENOUS
  Filled 2022-08-19: qty 4

## 2022-08-19 MED ORDER — DEXTROSE 5 % IV SOLN
250.0000 mg | INTRAVENOUS | Status: DC
  Filled 2022-08-19: qty 2.5

## 2022-08-19 MED ORDER — DOCUSATE SODIUM 100 MG PO CAPS: 100.0000 mg | ORAL_CAPSULE | Freq: Two times a day (BID) | ORAL | Status: DC | PRN

## 2022-08-19 MED ORDER — POLYETHYLENE GLYCOL 3350 17 G PO PACK: 17.0000 g | PACK | Freq: Every day | ORAL | Status: DC | PRN

## 2022-08-19 MED ORDER — ONDANSETRON HCL 4 MG/2ML IJ SOLN
4.0000 mg | Freq: Once | INTRAMUSCULAR | Status: AC
  Administered 2022-08-19: 4 mg via INTRAVENOUS
  Filled 2022-08-19: qty 2

## 2022-08-19 NOTE — ED Notes (Signed)
Patient desaturated on BIPAP to 81%, MD made aware. Sister spoke to him through the phone.

## 2022-08-19 NOTE — H&P (Addendum)
NAME:  James Villegas, MRN:  [id number], DOB:  Jun 20, 1955, LOS: 0 ADMISSION DATE:  08/19/2022, CONSULTATION DATE:  3/12 REFERRING MD:  Dr. Lanae Boast, CHIEF COMPLAINT:  AMS, respiratory failure   History of Present Illness:  67 year old male with past medical history as below, which is significant for stroke with residual right-sided hemiplegia, dysphagia, lupus, CHF, atrial fibrillation, hypercoagulable disorder with protein C and protein S deficiencies.  And suspected OSA/OHS, although patient had never received a formal sleep study despite multiple efforts.  The patient resides in SNF from where he presented to [facility] ED on 3/12 with complaints of shortness of breath and vomiting.  While in the emergency department the patient removed his oxygen and had oxygen saturations dipped to as low as 35% at which point he became minimally responsive.  He was placed on high flow O2 and ABG was done demonstrating significant hypercarbic failure.  The patient had previously stated wishes for DNR, however, available family members were not so sure.  PCCM was consulted for consideration of mechanical ventilation.  Pertinent  Medical History   has a past medical history of Acute on chronic congestive heart failure ([place]), Anemia, secondary, Anxiety disorder, Bloody stool (08/29/2011), CVA (cerebral vascular accident) ([place]) (2010), Depression, DVT of lower extremity (deep venous thrombosis) ([place]), Dysphagia, Hyperlipidemia, Hypertension, Lupus anticoagulant disorder ([place]) (1990), Morbid obesity ([place]), PFO (patent foramen ovale), Protein C deficiency ([place]), Protein S deficiency ([place]), Rectus sheath hematoma (7/12), and Seizure disorder ([place]).   Significant Hospital Events: Including procedures, antibiotic start and stop dates in addition to other pertinent events   3/12 admit to ICU  Interim History / Subjective:    Objective   Blood pressure (!) 145/66, pulse 76, temperature 97.8 F (36.6 C), temperature  source Oral, resp. rate (!) 24, height '6\' 1"'$  (1.854 m), weight (!) 137.9 kg, SpO2 91 %.    FiO2 (%):  [35 %] 35 %  No intake or output data in the 24 hours ending 08/19/22 1353 Filed Weights   08/19/22 1003  Weight: (!) 137.9 kg    Examination: General: Morbidly obese middle-age male HENT: Normocephalic, atraumatic, pupils bilaterally 6 mm and responsive Lungs: Distant but clear bilateral breath sounds Cardiovascular: Irregularly irregular, no murmurs rubs gallops.  Rate controlled Abdomen: Soft, nontender, nondistended Extremities: 1+ pitting edema bilateral ankles Neuro: Obtunded with minimal response to sternal rub  Resolved Hospital Problem list     Assessment & Plan:   Acute respiratory failure with hypoxia and hypercarbia: Etiology not entirely certain but decompensated OSA/OHS certainly playing a role.  Chest x-ray describing pulmonary edema, but considering his body habitus hard to know for sure.  Blood work and vital signs not obviously pointing to infection. - Long discussion with family. DNR and MOST indicating comfort measures only, but more recently the patient has been admitted on BiPAP.  - Start BiPAP as a temporary means to improve hypercarbia.  - If no improvement the patient would not want intubation, and if intubated would be unlikely to liberate.  - Can try some gentle diuresis - Empiric antibiotics - Admit to ICU for close follow up.   Permanent atrial fibrillation HTN HFpEF - Hold warfarin while NPO, may need to start heparin depending on course. INR 2.2 on admit.  - holding home losartan, aldactone, torsemide - Diuresis as above  History of CVA - holding zetia while NPO  Ascending aortic aneurysm  - outpatient surveillance   GOC: patient has signed DNR and MOST forms indicated comfort  measures only, but has since allowed other treatments such as BiPAP acutely. Dr. Lamonte Sakai discussed with the patient's sister, who indicated she would be open to trying  BiPAP again as a temporary measure to see if it can help him improve quickly, if not we will pursue comfort measures.   Best Practice (right click and "Reselect all SmartList Selections" daily)   Diet/type: NPO DVT prophylaxis: prophylactic heparin  if he tolerates BiPAP and improves will start heprain drip vs continuing warfarin.  GI prophylaxis: PPI Lines: N/A Foley:  N/A Code Status:  DNR Last date of multidisciplinary goals of care discussion '[ ]'$   Labs   CBC: Recent Labs  Lab 08/19/22 1010  WBC 8.8  NEUTROABS 7.5  HGB 11.4*  HCT 37.5*  MCV 101.9*  PLT [id number]    Basic Metabolic Panel: Recent Labs  Lab 08/19/22 1010  NA 144  K 3.6  CL 102  CO2 32  GLUCOSE 133*  BUN 8  CREATININE 0.48*  CALCIUM 7.3*   GFR: Estimated Creatinine Clearance: 136 mL/min (A) (by C-G formula based on SCr of 0.48 mg/dL (L)). Recent Labs  Lab 08/19/22 1010  WBC 8.8    Liver Function Tests: Recent Labs  Lab 08/19/22 1010  AST 11*  ALT 13  ALKPHOS 87  BILITOT 0.2*  PROT 5.1*  ALBUMIN 2.5*   No results for input(s): "LIPASE", "AMYLASE" in the last 168 hours. No results for input(s): "AMMONIA" in the last 168 hours.  ABG    Component Value Date/Time   PHART 7.17 (LL) 08/19/2022 1227   PCO2ART >123 (HH) 08/19/2022 1227   PO2ART 115 (H) 08/19/2022 1227   HCO3 48.1 (H) 08/19/2022 1227   TCO2 >50 (H) 09/16/2021 1048   O2SAT 99.3 08/19/2022 1227     Coagulation Profile: Recent Labs  Lab 08/19/22 1010  INR 2.2*    Cardiac Enzymes: No results for input(s): "CKTOTAL", "CKMB", "CKMBINDEX", "TROPONINI" in the last 168 hours.  HbA1C: Hgb A1c MFr Bld  Date/Time Value Ref Range Status  12/16/2010 05:50 AM 5.4 <5.7 % Final    Comment:    (NOTE)                                                                       According to the ADA Clinical Practice Recommendations for 2011, when HbA1c is used as a screening test:  >=6.5%   Diagnostic of Diabetes Mellitus           (if  abnormal result is confirmed) 5.7-6.4%   Increased risk of developing Diabetes Mellitus References:Diagnosis and Classification of Diabetes Mellitus,Diabetes Care,2011,34(Suppl 1):S62-S69 and Standards of Medical Care in         Diabetes - 2011,Diabetes [id number] (Suppl 1):S11-S61.  07/15/2008 01:20 PM  4.6 - 6.1 % Final   5.0 (NOTE)   The ADA recommends the following therapeutic goal for glycemic   control related to Hgb A1C measurement:   Goal of Therapy:   < 7.0% Hgb A1C   Reference: American Diabetes Association: Clinical Practice   Recommendations 2008, Diabetes Care,  2008, 31:(Suppl 1).    CBG: No results for input(s): "GLUCAP" in the last 168 hours.  Review of Systems:   Patient is encephalopathic and/or intubated. Therefore history  has been obtained from chart review.    Past Medical History:  He,  has a past medical history of Acute on chronic congestive heart failure ([place]), Anemia, secondary, Anxiety disorder, Bloody stool (08/29/2011), CVA (cerebral vascular accident) ([place]) (2010), Depression, DVT of lower extremity (deep venous thrombosis) ([place]), Dysphagia, Hyperlipidemia, Hypertension, Lupus anticoagulant disorder ([place]) (1990), Morbid obesity ([place]), PFO (patent foramen ovale), Protein C deficiency ([place]), Protein S deficiency ([place]), Rectus sheath hematoma (7/12), and Seizure disorder ([place]).   Surgical History:   Past Surgical History:  Procedure Laterality Date   dental extraction     multiple   vena cavogram  12/2010   INFERIOR     Social History:   reports that he quit smoking about 14 years ago. His smoking use included cigarettes and cigars. He has a 15.00 pack-year smoking history. He has never used smokeless tobacco. He reports that he does not drink alcohol and does not use drugs.   Family History:  His family history includes Sleep apnea in his sister.   Allergies Allergies  Allergen Reactions   Fenofibrate Other (See Comments)    Per MAR     Home  Medications  Prior to Admission medications   Medication Sig Start Date End Date Taking? Authorizing Provider  acetaminophen (TYLENOL) 325 MG tablet Take 650 mg by mouth every 6 (six) hours as needed for mild pain or headache.    [provider]  Carboxymeth-Glycerin-Polysorb (REFRESH OPTIVE ADVANCED) 0.5-1-0.5 % SOLN Place 1 drop into both eyes in the morning, at noon, in the evening, and at bedtime. Wait 3-5 minutes between eye medications    [provider]  cycloSPORINE (RESTASIS) 0.05 % ophthalmic emulsion Place 1 drop into both eyes 2 (two) times daily.    [provider]  empagliflozin (JARDIANCE) 10 MG TABS tablet Take by mouth daily.    [provider]  ezetimibe (ZETIA) 10 MG tablet Take 10 mg by mouth daily.    [provider]  fish oil-omega-3 fatty acids 1000 MG capsule Take 1,000 mg by mouth 2 (two) times daily with a meal.    [provider]  losartan (COZAAR) 25 MG tablet Take 25 mg by mouth daily. Pt takes 0.5 mg daily    [provider]  omeprazole (PRILOSEC) 20 MG capsule Take 20 mg by mouth daily. 08/01/21   [provider]  OXYGEN Inhale into the lungs See admin instructions. Nightly 9p-8a    [provider]  phenytoin (DILANTIN) 100 MG ER capsule Take 200 mg by mouth 2 (two) times daily.    [provider]  phenytoin (DILANTIN) 50 MG tablet Chew 50 mg by mouth daily.    [provider]  potassium chloride SA (KLOR-CON) 20 MEQ tablet Take 2 tablets (40 mEq total) by mouth daily. 08/08/20   Aline August, MD  rosuvastatin (CRESTOR) 40 MG tablet Take 40 mg by mouth at bedtime.    [provider]  spironolactone (ALDACTONE) 25 MG tablet Take 1 tablet (25 mg total) by mouth daily. 09/22/21 01/23/22  Shahmehdi, Valeria Batman, MD  torsemide (DEMADEX) 20 MG tablet Take 20 mg by mouth 2 (two) times daily.    [provider]  traZODone (DESYREL) 50 MG tablet Take 50 mg by mouth at  bedtime. 08/01/21   [provider]  venlafaxine XR (EFFEXOR-XR) 37.5 MG 24 hr capsule Take 37.5 mg by mouth at bedtime.    [provider]  warfarin (COUMADIN) 1 MG tablet Take 0.5 mg  by mouth. Tues/Thur/Sat/Sun 08/05/21   [provider]  warfarin (COUMADIN) 4 MG tablet Take 4 mg by mouth. take 1 tab daily on Mon/Wed/Fri and tab4.5 on other days    [provider]     Critical care time: 44 minutes     [name], AGACNP-BC [place] for personal pager PCCM on call pager [phone] until 7pm. Please call Elink 7p-7a. [phone]  08/19/2022 2:20 PM

## 2022-08-19 NOTE — ED Provider Notes (Signed)
[facility] Provider Note   CSN: [id number] Arrival date & time: 08/19/22  [id number]     History  Chief Complaint  Patient presents with   Shortness of Breath    James Villegas is a 67 y.o. male.   Shortness of Breath Patient denies that shortness of breath.  Some nausea vomiting.  Reported began the last couple days.  History of chronic congestive heart failure.  On chronic oxygen.  Reportedly more hypoxic.  Also has had some nausea and vomiting.  Somewhat difficult to get history from patient.  Is on Depakote and Coumadin.    Past Medical History:  Diagnosis Date   Acute on chronic congestive heart failure (HCC)    Anemia, secondary    SECONDARY TO ACUTE BLOOD LOSS   Anxiety disorder    Bloody stool 08/29/2011   intermittent along with constipation.    CVA (cerebral vascular accident) ([place]) 2010   large left MCA stroke with right hemiparesis   Depression    DVT of lower extremity (deep venous thrombosis) ([place])    RIGHT LOWER; s/p IVC filter 7/12   Dysphagia    Hyperlipidemia    Hypertension    Lupus anticoagulant disorder ([place]) 1990   Morbid obesity (HCC)    PFO (patent foramen ovale)    TEE 2/10: EF 60%, trivial AI, mild Ao root dilatation, mod PFO with R-L shunting, atrial septal aneurysm;   echo 7/12: EF 65-70%, grade 1 diast dysfxn, mild MR, LVOT showed severe obstruction   Protein C deficiency (HCC)    Protein S deficiency (HCC)    Rectus sheath hematoma 7/12   required reversal of anticoagulation and c/b DVT req. IVC filter   Seizure disorder ([place])     Home Medications Prior to Admission medications   Medication Sig Start Date End Date Taking? Authorizing Provider  acetaminophen (TYLENOL) 325 MG tablet Take 650 mg by mouth every 6 (six) hours as needed for mild pain or headache.    [provider]  Carboxymeth-Glycerin-Polysorb (REFRESH OPTIVE ADVANCED) 0.5-1-0.5 % SOLN Place 1 drop into both eyes in the  morning, at noon, in the evening, and at bedtime. Wait 3-5 minutes between eye medications    [provider]  cycloSPORINE (RESTASIS) 0.05 % ophthalmic emulsion Place 1 drop into both eyes 2 (two) times daily.    [provider]  empagliflozin (JARDIANCE) 10 MG TABS tablet Take by mouth daily.    [provider]  ezetimibe (ZETIA) 10 MG tablet Take 10 mg by mouth daily.    [provider]  fish oil-omega-3 fatty acids 1000 MG capsule Take 1,000 mg by mouth 2 (two) times daily with a meal.    [provider]  losartan (COZAAR) 25 MG tablet Take 25 mg by mouth daily. Pt takes 0.5 mg daily    [provider]  omeprazole (PRILOSEC) 20 MG capsule Take 20 mg by mouth daily. 08/01/21   [provider]  OXYGEN Inhale into the lungs See admin instructions. Nightly 9p-8a    [provider]  phenytoin (DILANTIN) 100 MG ER capsule Take 200 mg by mouth 2 (two) times daily.    [provider]  phenytoin (DILANTIN) 50 MG tablet Chew 50 mg by mouth daily.    [provider]  potassium chloride SA (KLOR-CON) 20 MEQ tablet Take 2 tablets (40 mEq total) by mouth daily. 08/08/20   Aline August, MD  rosuvastatin (CRESTOR) 40 MG tablet Take  40 mg by mouth at bedtime.    [provider]  spironolactone (ALDACTONE) 25 MG tablet Take 1 tablet (25 mg total) by mouth daily. 09/22/21 01/23/22  Shahmehdi, Valeria Batman, MD  torsemide (DEMADEX) 20 MG tablet Take 20 mg by mouth 2 (two) times daily.    [provider]  traZODone (DESYREL) 50 MG tablet Take 50 mg by mouth at bedtime. 08/01/21   [provider]  venlafaxine XR (EFFEXOR-XR) 37.5 MG 24 hr capsule Take 37.5 mg by mouth at bedtime.    [provider]  warfarin (COUMADIN) 1 MG tablet Take 0.5 mg by mouth. Tues/Thur/Sat/Sun 08/05/21   [provider]  warfarin (COUMADIN) 4 MG tablet Take 4 mg by mouth. take 1 tab daily on Mon/Wed/Fri and tab4.5 on  other days    [provider]      Allergies    Fenofibrate    Review of Systems   Review of Systems  Respiratory:  Positive for shortness of breath.     Physical Exam Updated Vital Signs BP (!) 156/92   Pulse 84   Temp 97.8 F (36.6 C) (Oral)   Resp (!) 23   Ht '6\' 1"'$  (1.854 m)   Wt (!) 137.9 kg   SpO2 94%   BMI 40.11 kg/m  Physical Exam Vitals and nursing note reviewed.  Constitutional:      Appearance: He is well-developed. He is obese.  Cardiovascular:     Rate and Rhythm: Normal rate. Rhythm irregular.  Pulmonary:     Comments: Mildly harsh breath sounds bilaterally. Chest:     Chest wall: No tenderness.  Abdominal:     Tenderness: There is abdominal tenderness.     Comments: Mild abdominal tenderness without rebound or guarding.  Limited somewhat by body habitus.  Musculoskeletal:     Comments: Some chronic edema bilateral lower extremities.     ED Results / Procedures / Treatments   Labs (all labs ordered are listed, but only abnormal results are displayed) Labs Reviewed  BRAIN NATRIURETIC PEPTIDE - Abnormal; Notable for the following components:      Result Value   B Natriuretic Peptide 233.8 (*)    All other components within normal limits  COMPREHENSIVE METABOLIC PANEL - Abnormal; Notable for the following components:   Glucose, Bld 133 (*)    Creatinine, Ser 0.48 (*)    Calcium 7.3 (*)    Total Protein 5.1 (*)    Albumin 2.5 (*)    AST 11 (*)    Total Bilirubin 0.2 (*)    All other components within normal limits  CBC WITH DIFFERENTIAL/PLATELET - Abnormal; Notable for the following components:   RBC 3.68 (*)    Hemoglobin 11.4 (*)    HCT 37.5 (*)    MCV 101.9 (*)    Lymphs Abs 0.4 (*)    Abs Immature Granulocytes 0.08 (*)    All other components within normal limits  PROTIME-INR - Abnormal; Notable for the following components:   Prothrombin Time 24.3 (*)    INR 2.2 (*)    All other components within normal limits  BLOOD GAS,  ARTERIAL - Abnormal; Notable for the following components:   pH, Arterial 7.17 (*)    pCO2 arterial >123 (*)    pO2, Arterial 115 (*)    Bicarbonate 48.1 (*)    Acid-Base Excess 13.1 (*)    All other components within normal limits  TROPONIN I (HIGH SENSITIVITY) - Abnormal; Notable for the following components:  Troponin I (High Sensitivity) 21 (*)    All other components within normal limits  RESP PANEL BY RT-PCR (RSV, FLU A&B, COVID)  RVPGX2  PHENYTOIN LEVEL, TOTAL  PROCALCITONIN  BRAIN NATRIURETIC PEPTIDE  I-STAT ARTERIAL BLOOD GAS, ED  I-STAT ARTERIAL BLOOD GAS, ED  TROPONIN I (HIGH SENSITIVITY)    EKG EKG Interpretation  Date/Time:  Tuesday August 19 2022 11:37:31 EDT Ventricular Rate:  84 PR Interval:    QRS Duration: 150 QT Interval:  344 QTC Calculation: 407 R Axis:   103 Text Interpretation: Atrial fibrillation RBBB and LPFB ST depr, consider ischemia, inferior leads Confirmed by Davonna Belling [phone]) on 08/19/2022 12:07:20 PM  Radiology DG Chest Portable 1 View  Result Date: 08/19/2022 CLINICAL DATA:  Shortness of breath. EXAM: PORTABLE CHEST 1 VIEW COMPARISON:  Chest x-ray dated September 16, 2021. FINDINGS: Unchanged cardiomegaly, pulmonary vascular congestion, and mild diffuse interstitial thickening. Unchanged small bilateral pleural effusions. No pneumothorax. No acute osseous abnormality. IMPRESSION: 1. Unchanged mild congestive heart failure. Electronically Signed   By: Titus Dubin M.D.   On: 08/19/2022 11:00    Procedures Procedures    Medications Ordered in ED Medications  docusate sodium (COLACE) capsule 100 mg (has no administration in time range)  polyethylene glycol (MIRALAX / GLYCOLAX) packet 17 g (has no administration in time range)  heparin injection 5,000 Units (has no administration in time range)  ondansetron (ZOFRAN) injection 4 mg (4 mg Intravenous Given 08/19/22 1022)    ED Course/ Medical Decision Making/ A&P                              Medical Decision Making Amount and/or Complexity of Data Reviewed Labs: ordered. Radiology: ordered.  Risk Prescription drug management. Decision regarding hospitalization.   Patient with shortness of breath nausea and vomiting.  History of chronic hypoxia and chronic respiratory failure.  Will get x-ray and basic blood work.  Differential diagnosis includes COPD, CHF, pneumonia, infection.  Reviewed previous cardiology note.  Chest x-ray shows some CHF.  Independently interpreted by me.  However patient was in the room and removed his oxygen.  Found to have sats of 35%.  Started back on oxygen and bagged back up.  Patient's ABG is returned.  Severe CO2 retention.  Mental status continued to decline.  Discussed with patient's sister and would reportedly want intubation was needed.  However review of patient's MOST form mentioned comfort care.  May have component of CHF with some volume overload.  Discussed with Dr. Lamonte Sakai from critical care.  He had a long discussion with patient's sister.  Will give BiPAP despite the risks of aspiration as a potential bridge since patient does not want intubation.  Patient still DNR. CRITICAL CARE Performed by: Davonna Belling Total critical care time: 30 minutes Critical care time was exclusive of separately billable procedures and treating other patients. Critical care was necessary to treat or prevent imminent or life-threatening deterioration. Critical care was time spent personally by me on the following activities: development of treatment plan with patient and/or surrogate as well as nursing, discussions with consultants, evaluation of patient's response to treatment, examination of patient, obtaining history from patient or surrogate, ordering and performing treatments and interventions, ordering and review of laboratory studies, ordering and review of radiographic studies, pulse oximetry and re-evaluation of patient's condition.         Final Clinical Impression(s) / ED Diagnoses Final diagnoses:  Acute on chronic respiratory  failure with hypoxia and hypercapnia [facility])    Rx / DC Orders ED Discharge Orders     None         Davonna Belling, MD 08/19/22 1352

## 2022-08-19 NOTE — Progress Notes (Signed)
RT assisted with patient transport from ED to [id number] without complications.

## 2022-08-19 NOTE — ED Notes (Addendum)
Primary Nurse went to check on patient and found patient with his oxygen off and his oxygen saturation of 32%. Nurse increased oxygen and placed on non-rebreather while preparing ambu bag. Pt minimally responsive and diaphoretic.  Nurse and respiratory then bagged patient back to oxygen saturation in the 90%s. MD made aware.  Bilateral breath sounds are diminished at this time.

## 2022-08-19 NOTE — ED Triage Notes (Signed)
Pt BIBGEMS for low oxygenation saturation of 80% on 2LPM at baseline. Patient reports SOB x1 day. Reports 80% oxygenation at nursing home. Bilateral lower extremity edema  18 g LAC 18g L forearm  Hx - CHF and recent dx PNA  35 etco2 144/88 92% oxygen at 6 lPM 96.6  Cbg 205

## 2022-08-19 NOTE — ED Notes (Signed)
MD assessed for gag reflex. None present at this time.

## 2022-08-19 NOTE — ED Notes (Deleted)
Patient desaturated on BIPAP to 81%, MD made aware. Sister spoke to him through the phone.

## 2022-08-19 NOTE — ED Notes (Signed)
Updated sister on patient status.

## 2022-08-19 NOTE — Progress Notes (Signed)
RT responded to patient with dropped sats. RN at bedside. Patient had removed [place], sats 35%. RT placed NRB on patient at 15L, sats improved to 77%. RT manually ventilated the patient with an ambu bag, sats recovered to 97% after 2 minutes. Patient placed back on [place] 15L, ED MD notified.

## 2022-08-20 DIAGNOSIS — J9601 Acute respiratory failure with hypoxia: Secondary | ICD-10-CM | POA: Diagnosis not present

## 2022-08-20 DIAGNOSIS — J9602 Acute respiratory failure with hypercapnia: Secondary | ICD-10-CM | POA: Diagnosis not present

## 2022-08-20 LAB — POCT I-STAT 7, (LYTES, BLD GAS, ICA,H+H)
Acid-Base Excess: 15 mmol/L — ABNORMAL HIGH (ref 0.0–2.0)
Bicarbonate: 41.9 mmol/L — ABNORMAL HIGH (ref 20.0–28.0)
Calcium, Ion: 1.19 mmol/L (ref 1.15–1.40)
HCT: 37 % — ABNORMAL LOW (ref 39.0–52.0)
Hemoglobin: 12.6 g/dL — ABNORMAL LOW (ref 13.0–17.0)
O2 Saturation: 92 %
Patient temperature: 98.6
Potassium: 3.3 mmol/L — ABNORMAL LOW (ref 3.5–5.1)
Sodium: 142 mmol/L (ref 135–145)
TCO2: 44 mmol/L — ABNORMAL HIGH (ref 22–32)
pCO2 arterial: 57.9 mmHg — ABNORMAL HIGH (ref 32–48)
pH, Arterial: 7.468 — ABNORMAL HIGH (ref 7.35–7.45)
pO2, Arterial: 64 mmHg — ABNORMAL LOW (ref 83–108)

## 2022-08-20 LAB — BASIC METABOLIC PANEL
Anion gap: 13 (ref 5–15)
BUN: 14 mg/dL (ref 8–23)
CO2: 33 mmol/L — ABNORMAL HIGH (ref 22–32)
Calcium: 9 mg/dL (ref 8.9–10.3)
Chloride: 95 mmol/L — ABNORMAL LOW (ref 98–111)
Creatinine, Ser: 0.73 mg/dL (ref 0.61–1.24)
GFR, Estimated: >60 mL/min (ref >60)
Glucose, Bld: 81 mg/dL (ref 70–99)
Potassium: 4.2 mmol/L (ref 3.5–5.1)
Sodium: 141 mmol/L (ref 135–145)

## 2022-08-20 LAB — CBC
HCT: 41.6 % (ref 39.0–52.0)
Hemoglobin: 13.1 g/dL (ref 13.0–17.0)
MCH: 31 pg (ref 26.0–34.0)
MCHC: 31.5 g/dL (ref 30.0–36.0)
MCV: 98.3 fL (ref 80.0–100.0)
Platelets: 185 10*3/uL (ref 150–400)
RBC: 4.23 MIL/uL (ref 4.22–5.81)
RDW: 13.4 % (ref 11.5–15.5)
WBC: 6.9 10*3/uL (ref 4.0–10.5)
nRBC: 0 % (ref 0.0–0.2)

## 2022-08-20 LAB — PHOSPHORUS
Phosphorus: 1.5 mg/dL — ABNORMAL LOW (ref 2.5–4.6)
Phosphorus: 4.2 mg/dL (ref 2.5–4.6)

## 2022-08-20 LAB — PROTIME-INR
INR: 2.2 — ABNORMAL HIGH (ref 0.8–1.2)
Prothrombin Time: 23.9 seconds — ABNORMAL HIGH (ref 11.4–15.2)

## 2022-08-20 LAB — MAGNESIUM: Magnesium: 2.2 mg/dL (ref 1.7–2.4)

## 2022-08-20 LAB — MRSA NEXT GEN BY PCR, NASAL: MRSA by PCR Next Gen: NOT DETECTED

## 2022-08-20 MED ORDER — EZETIMIBE 10 MG PO TABS
10.0000 mg | ORAL_TABLET | Freq: Every day | ORAL | Status: DC
  Administered 2022-08-20 – 2022-08-26 (×7): 10 mg via ORAL
  Filled 2022-08-20 (×7): qty 1

## 2022-08-20 MED ORDER — WARFARIN - PHARMACIST DOSING INPATIENT: Freq: Every day | Status: DC

## 2022-08-20 MED ORDER — DOXYCYCLINE HYCLATE 100 MG PO TABS
100.0000 mg | ORAL_TABLET | Freq: Two times a day (BID) | ORAL | Status: AC
  Administered 2022-08-20 – 2022-08-23 (×8): 100 mg via ORAL
  Filled 2022-08-20 (×9): qty 1

## 2022-08-20 MED ORDER — CHLORHEXIDINE GLUCONATE CLOTH 2 % EX PADS
6.0000 | MEDICATED_PAD | Freq: Every day | CUTANEOUS | Status: DC
  Administered 2022-08-20 – 2022-08-26 (×6): 6 via TOPICAL

## 2022-08-20 MED ORDER — SODIUM PHOSPHATES 45 MMOLE/15ML IV SOLN
45.0000 mmol | Freq: Once | INTRAVENOUS | Status: AC
  Administered 2022-08-20: 45 mmol via INTRAVENOUS
  Filled 2022-08-20: qty 15

## 2022-08-20 MED ORDER — WARFARIN SODIUM 2 MG PO TABS
2.0000 mg | ORAL_TABLET | Freq: Once | ORAL | Status: AC
  Administered 2022-08-20: 2 mg via ORAL
  Filled 2022-08-20: qty 1

## 2022-08-20 MED ORDER — TORSEMIDE 20 MG PO TABS
20.0000 mg | ORAL_TABLET | Freq: Every day | ORAL | Status: DC
  Administered 2022-08-20: 20 mg via ORAL
  Filled 2022-08-20: qty 1

## 2022-08-20 NOTE — Progress Notes (Signed)
  ANTICOAGULATION CONSULT NOTE - Initial Consult  Pharmacy Consult for warfarin Indication:  Protein C and S def and + lupus anticoagulant, atrial fibrillation   Allergies  Allergen Reactions   Fenofibrate Other (See Comments)    Per MAR    Patient Measurements: Height: 6\' 1"  (185.4 cm) Weight: (!) 137.9 kg (304 lb) IBW/kg (Calculated) : 79.9   Vital Signs: Temp: 98.6 F (37 C) (03/13 0727) Temp Source: Axillary (03/13 0727) BP: 133/70 (03/13 1000) Pulse Rate: 92 (03/13 1000)  Labs: Recent Labs    08/19/22 1010 08/19/22 1508 08/20/22 0134 08/20/22 0851 08/20/22 0906  HGB 11.4*  --  13.1  --  12.6*  HCT 37.5*  --  41.6  --  37.0*  PLT 182  --  185  --   --   LABPROT 24.3*  --   --  23.9*  --   INR 2.2*  --   --  2.2*  --   CREATININE 0.48*  --  0.73  --   --   TROPONINIHS 21* 20*  --   --   --     Estimated Creatinine Clearance: 136 mL/min (by C-G formula based on SCr of 0.73 mg/dL).   Medical History: Past Medical History:  Diagnosis Date   Acute on chronic congestive heart failure (HCC)    Anemia, secondary    SECONDARY TO ACUTE BLOOD LOSS   Anxiety disorder    Bloody stool 08/29/2011   intermittent along with constipation.    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   Depression    DVT of lower extremity (deep venous thrombosis) ([place])    RIGHT LOWER; s/p IVC filter 7/12   Dysphagia    Hyperlipidemia    Hypertension    Lupus anticoagulant disorder ([place]) 1990   Morbid obesity (HCC)    PFO (patent foramen ovale)    TEE 2/10: EF 60%, trivial AI, mild Ao root dilatation, mod PFO with R-L shunting, atrial septal aneurysm;   echo 7/12: EF 65-70%, grade 1 diast dysfxn, mild MR, LVOT showed severe obstruction   Protein C deficiency (HCC)    Protein S deficiency (HCC)    Rectus sheath hematoma 7/12   required reversal of anticoagulation and c/b DVT req. IVC filter   Seizure disorder (HCC)      Assessment: 63 YOM with hx afib and protein C and protein S deficiency, lupus anticoagulant (PTT-LA) positive on warfarin PTA. Home regimen appears to be 4 mg daily except 4.5 mg MWF per recent outpatient cardiology visit on 08/01/22, however nursing home [facility] reflects regimen is 4.5 mg daily. INR 2.2 today therapeutic. CBC wnl. Last dose of warfarin prior to admission. Patient started on azithromycin and ceftriaxone for suspected CAP, transitioned to doxycycline 3/13 for 5 days total. Will closely watch INR given DDI.   Goal of Therapy:  INR 2-3 Monitor platelets by anticoagulation protocol: Yes   Plan:  Warfarin 2 mg x 1 Daily CBC, INR Monitor s/sx bleeding   Eliseo Gum, PharmD PGY1 Pharmacy Resident   08/20/2022  12:03 PM

## 2022-08-20 NOTE — Progress Notes (Signed)
[facility] ADULT ICU REPLACEMENT PROTOCOL   The patient does apply for the [facility] Adult ICU Electrolyte Replacment Protocol based on the criteria listed below:   1.Exclusion criteria: TCTS, ECMO, Dialysis, and Myasthenia Gravis patients 2. Is GFR >/= 30 ml/min? Yes.    Patient's GFR today is >60 3. Is SCr </= 2? Yes.   Patient's SCr is 0.73 mg/dL 4. Did SCr increase >/= 0.5 in 24 hours? No. 5.Pt's weight >40kg  Yes.   6. Abnormal electrolyte(s): phos 1.5  7. Electrolytes replaced per protocol 8.  Call MD STAT for K+ </= 2.5, Phos </= 1, or Mag </= 1 Physician:  protocol  Darlys Gales 08/20/2022 2:37 AM

## 2022-08-20 NOTE — Progress Notes (Signed)
NAME:  James Villegas, MRN:  [id number], DOB:  1956-04-25, LOS: 1 ADMISSION DATE:  08/19/2022, CONSULTATION DATE:  3/12 REFERRING MD:  Dr. Lanae Boast, CHIEF COMPLAINT:  AMS, respiratory failure   History of Present Illness:  67 year old male with past medical history as below, which is significant for stroke with residual right-sided hemiplegia, dysphagia, lupus, CHF, atrial fibrillation, hypercoagulable disorder with protein C and protein S deficiencies.  And suspected OSA/OHS, although patient had never received a formal sleep study despite multiple efforts.  The patient resides in SNF from where he presented to [facility], ED on 3/12 with complaints of shortness of breath and vomiting.  While in the emergency department the patient removed his oxygen and had oxygen saturations dipped to as low as 35% at which point he became minimally responsive.  He was placed on high flow O2 and ABG was done demonstrating significant hypercarbic failure.  The patient had previously stated wishes for DNR, however, available family members were not so sure.  PCCM was consulted for consideration of mechanical ventilation.  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   Significant Hospital Events: Including procedures, antibiotic start and stop dates in addition to other pertinent events   3/12 admit to ICU  Interim History / Subjective:    Objective   Blood pressure 133/70, pulse 92, temperature 98.6 F (37 C), temperature source  Axillary, resp. rate 20, height '6\' 1"'$  (1.854 m), weight (!) 137.9 kg, SpO2 97 %.    Vent Mode: PCV;BIPAP FiO2 (%):  [35 %-50 %] 50 % Set Rate:  [25 bmp-27 bmp] 27 bmp PEEP:  [5 cmH20-8 cmH20] 8 cmH20 Plateau Pressure:  [16 cmH20] 16 cmH20   Intake/Output Summary (Last 24 hours) at 08/20/2022 1047 Last data filed at 08/20/2022 1000 Gross per 24 hour  Intake 880.4 ml  Output 150 ml  Net 730.4 ml   Filed Weights   08/19/22 1003  Weight: (!) 137.9 kg    Examination: General: Morbidly obese middle-age male, resting in no acute distress HENT: Normocephalic, atraumatic. No scleral icterus.  Lungs: Normal work of breathing on BiPAP. Clear to auscultation bilaterally.  Cardiovascular: Irregularly irregular, no murmurs rubs gallops.  Rate controlled Abdomen: Soft, nontender, non-distended, normoactive bowel sounds. Extremities: Warm, dry. 1+ pitting edema bilateral ankles Neuro: Awake, alert, conversing appropriately.   Resolved Hospital Problem list     Assessment & Plan:  #Acute hypoxic, hypercarbic respiratory failure Most likely patient's underlying OSA/OHS contributing to his presentation symptoms. This morning pH has normalized and we were able to take off his BiPAP mask. I do not think his symptoms are necessarily consistent with an infection, but since he did improve with antibiotics we will continue with a short course of oral abx. He likely would benefit from nightly CPAP and sleep study in the outpatient setting.  - Start doxycycline '100mg'$  every 12h x4d - CPAP QHS - Outpatient sleep study - Transfer to med-tele  #Permanent atrial fibrillation #Hypertension #Chronic diastolic heart failure INR 2.2, still therapeutic. Given he is off the BiPAP mask and mentating well we will re-start his home warfarin. In addition, we  will re-start his home diuretics at half dose, would up-titrate if doing well by tomorrow.  - Warfarin per pharmacy - Torsemide '20mg'$  once daily  #History of  CVA - Re-start home Zetia  #Ascending aortic aneurysm  - outpatient surveillance   Best Practice (right click and "Reselect all SmartList Selections" daily)   Diet/type: Regular consistency (see orders) DVT prophylaxis: warfarin GI prophylaxis: N/A Lines: N/A Foley:  N/A Code Status:  DNR Last date of multidisciplinary goals of care discussion [D/w sister yesterday 3/12 ]  Labs   CBC: Recent Labs  Lab 08/19/22 1010 08/20/22 0134 08/20/22 0906  WBC 8.8 6.9  --   NEUTROABS 7.5  --   --   HGB 11.4* 13.1 12.6*  HCT 37.5* 41.6 37.0*  MCV 101.9* 98.3  --   PLT 182 185  --      Basic Metabolic Panel: Recent Labs  Lab 08/19/22 1010 08/20/22 0134 08/20/22 0906  NA 144 141 142  K 3.6 4.2 3.3*  CL 102 95*  --   CO2 32 33*  --   GLUCOSE 133* 81  --   BUN 8 14  --   CREATININE 0.48* 0.73  --   CALCIUM 7.3* 9.0  --   MG  --  2.2  --   PHOS  --  1.5*  --     GFR: Estimated Creatinine Clearance: 136 mL/min (by C-G formula based on SCr of 0.73 mg/dL). Recent Labs  Lab 08/19/22 1010 08/19/22 1508 08/20/22 0134  PROCALCITON  --  <0.10  --   WBC 8.8  --  6.9     Liver Function Tests: Recent Labs  Lab 08/19/22 1010  AST 11*  ALT 13  ALKPHOS 87  BILITOT 0.2*  PROT 5.1*  ALBUMIN 2.5*    No results for input(s): "LIPASE", "AMYLASE" in the last 168 hours. No results for input(s): "AMMONIA" in the last 168 hours.  ABG    Component Value Date/Time   PHART 7.468 (H) 08/20/2022 0906   PCO2ART 57.9 (H) 08/20/2022 0906   PO2ART 64 (L) 08/20/2022 0906   HCO3 41.9 (H) 08/20/2022 0906   TCO2 44 (H) 08/20/2022 0906   O2SAT 92 08/20/2022 0906     Coagulation Profile: Recent Labs  Lab 08/19/22 1010 08/20/22 0851  INR 2.2* 2.2*     Cardiac Enzymes: No results for input(s): "CKTOTAL", "CKMB", "CKMBINDEX", "TROPONINI" in the last 168 hours.  HbA1C: Hgb A1c MFr Bld  Date/Time Value Ref Range Status  12/16/2010 05:50 AM 5.4 <5.7 % Final    Comment:     (NOTE)                                                                       According to the ADA Clinical Practice Recommendations for 2011, when HbA1c is used as a screening test:  >=6.5%   Diagnostic of Diabetes Mellitus           (if abnormal result is confirmed) 5.7-6.4%   Increased risk of developing Diabetes Mellitus References:Diagnosis and Classification of Diabetes Mellitus,Diabetes [id number] 1):S62-S69 and Standards of Medical Care in         Diabetes - 2011,Diabetes [id number] (Suppl 1):S11-S61.  07/15/2008 01:20 PM  4.6 - 6.1 %  Final   5.0 (NOTE)   The ADA recommends the following therapeutic goal for glycemic   control related to Hgb A1C measurement:   Goal of Therapy:   < 7.0% Hgb A1C   Reference: American Diabetes Association: Clinical Practice   Recommendations 2008, Diabetes Care,  2008, 31:(Suppl 1).    CBG: Recent Labs  Lab 08/19/22 1528 08/19/22 1940  GLUCAP 119* 90    Review of Systems:   N/a   Past Medical History:  He,  has a past medical history of Acute on chronic congestive heart failure ([place]), Anemia, secondary, Anxiety disorder, Bloody stool (08/29/2011), CVA (cerebral vascular accident) ([place]) (2010), Depression, DVT of lower extremity (deep venous thrombosis) ([place]), Dysphagia, Hyperlipidemia, Hypertension, Lupus anticoagulant disorder ([place]) (1990), Morbid obesity ([place]), PFO (patent foramen ovale), Protein C deficiency ([place]), Protein S deficiency ([place]), Rectus sheath hematoma (7/12), and Seizure disorder ([place]).   Surgical History:   Past Surgical History:  Procedure Laterality Date   dental extraction     multiple   vena cavogram  12/2010   INFERIOR     Social History:   reports that he quit smoking about 14 years ago. His smoking use included cigarettes and cigars. He has a 15.00 pack-year smoking history. He has never used smokeless tobacco. He reports that he does not drink alcohol and does not use drugs.   Family History:  His  family history includes Sleep apnea in his sister.   Allergies Allergies  Allergen Reactions   Fenofibrate Other (See Comments)    Per MAR     Home Medications  Prior to Admission medications   Medication Sig Start Date End Date Taking? Authorizing Provider  acetaminophen (TYLENOL) 325 MG tablet Take 650 mg by mouth every 6 (six) hours as needed for mild pain or headache.    [provider]  Carboxymeth-Glycerin-Polysorb (REFRESH OPTIVE ADVANCED) 0.5-1-0.5 % SOLN Place 1 drop into both eyes in the morning, at noon, in the evening, and at bedtime. Wait 3-5 minutes between eye medications    [provider]  cycloSPORINE (RESTASIS) 0.05 % ophthalmic emulsion Place 1 drop into both eyes 2 (two) times daily.    [provider]  empagliflozin (JARDIANCE) 10 MG TABS tablet Take by mouth daily.    [provider]  ezetimibe (ZETIA) 10 MG tablet Take 10 mg by mouth daily.    [provider]  fish oil-omega-3 fatty acids 1000 MG capsule Take 1,000 mg by mouth 2 (two) times daily with a meal.    [provider]  losartan (COZAAR) 25 MG tablet Take 25 mg by mouth daily. Pt takes 0.5 mg daily    [provider]  omeprazole (PRILOSEC) 20 MG capsule Take 20 mg by mouth daily. 08/01/21   [provider]  OXYGEN Inhale into the lungs See admin instructions. Nightly 9p-8a    [provider]  phenytoin (DILANTIN) 100 MG ER capsule Take 200 mg by mouth 2 (two) times daily.    [provider]  phenytoin (DILANTIN) 50 MG tablet Chew 50 mg by mouth daily.    [provider]  potassium chloride SA (KLOR-CON) 20 MEQ tablet Take 2 tablets (40 mEq total) by mouth daily. 08/08/20   Aline August, MD  rosuvastatin (CRESTOR) 40 MG tablet Take 40 mg by mouth at bedtime.    [provider]  spironolactone (ALDACTONE) 25 MG tablet Take 1 tablet (25 mg total) by mouth daily. 09/22/21 01/23/22  Deatra James, MD  torsemide (DEMADEX) 20 MG tablet Take 20 mg by mouth 2 (two) times daily.    [provider]  traZODone (DESYREL) 50 MG tablet Take 50 mg by mouth at bedtime. 08/01/21   [provider]  venlafaxine XR (EFFEXOR-XR) 37.5 MG 24 hr capsule Take 37.5 mg by mouth at bedtime.    [provider]  warfarin (COUMADIN) 1 MG tablet Take 0.5 mg by mouth. Tues/Thur/Sat/Sun 08/05/21   [provider]  warfarin (COUMADIN) 4 MG tablet Take 4 mg by mouth. take 1 tab daily on Mon/Wed/Fri and tab4.5 on other days    [provider]    Sanjuan Dame, MD Internal Medicine PGY-3 Pager: [phone]

## 2022-08-20 NOTE — Progress Notes (Signed)
Pt does not want to go back on the BIPAP at this time, no distress noted, vitals are stable. PT is currently on HFNC (salter), tolerating well.

## 2022-08-21 ENCOUNTER — Inpatient Hospital Stay (HOSPITAL_COMMUNITY): Payer: Medicare Other

## 2022-08-21 DIAGNOSIS — J9601 Acute respiratory failure with hypoxia: Secondary | ICD-10-CM | POA: Diagnosis not present

## 2022-08-21 DIAGNOSIS — J9602 Acute respiratory failure with hypercapnia: Secondary | ICD-10-CM | POA: Diagnosis not present

## 2022-08-21 LAB — CBC
HCT: 38 % — ABNORMAL LOW (ref 39.0–52.0)
Hemoglobin: 12.2 g/dL — ABNORMAL LOW (ref 13.0–17.0)
MCH: 31 pg (ref 26.0–34.0)
MCHC: 32.1 g/dL (ref 30.0–36.0)
MCV: 96.4 fL (ref 80.0–100.0)
Platelets: 190 10*3/uL (ref 150–400)
RBC: 3.94 MIL/uL — ABNORMAL LOW (ref 4.22–5.81)
RDW: 13.8 % (ref 11.5–15.5)
WBC: 6.2 10*3/uL (ref 4.0–10.5)
nRBC: 0 % (ref 0.0–0.2)

## 2022-08-21 LAB — COMPREHENSIVE METABOLIC PANEL
ALT: 15 U/L (ref 0–44)
AST: 24 U/L (ref 15–41)
Albumin: 2.8 g/dL — ABNORMAL LOW (ref 3.5–5.0)
Alkaline Phosphatase: 88 U/L (ref 38–126)
Anion gap: 12 (ref 5–15)
BUN: 15 mg/dL (ref 8–23)
CO2: 34 mmol/L — ABNORMAL HIGH (ref 22–32)
Calcium: 8.4 mg/dL — ABNORMAL LOW (ref 8.9–10.3)
Chloride: 95 mmol/L — ABNORMAL LOW (ref 98–111)
Creatinine, Ser: 0.72 mg/dL (ref 0.61–1.24)
GFR, Estimated: >60 mL/min (ref >60)
Glucose, Bld: 87 mg/dL (ref 70–99)
Potassium: 3.2 mmol/L — ABNORMAL LOW (ref 3.5–5.1)
Sodium: 141 mmol/L (ref 135–145)
Total Bilirubin: 0.4 mg/dL (ref 0.3–1.2)
Total Protein: 5.7 g/dL — ABNORMAL LOW (ref 6.5–8.1)

## 2022-08-21 LAB — TSH: TSH: 1.55 u[IU]/mL (ref 0.350–4.500)

## 2022-08-21 LAB — PROTIME-INR
INR: 1.8 — ABNORMAL HIGH (ref 0.8–1.2)
Prothrombin Time: 20.7 seconds — ABNORMAL HIGH (ref 11.4–15.2)

## 2022-08-21 LAB — C-REACTIVE PROTEIN: CRP: 10.1 mg/dL — ABNORMAL HIGH (ref <1.0)

## 2022-08-21 LAB — BRAIN NATRIURETIC PEPTIDE: B Natriuretic Peptide: 156.1 pg/mL — ABNORMAL HIGH (ref 0.0–100.0)

## 2022-08-21 LAB — PROCALCITONIN: Procalcitonin: <0.1 ng/mL

## 2022-08-21 LAB — MAGNESIUM: Magnesium: 2.1 mg/dL (ref 1.7–2.4)

## 2022-08-21 LAB — PHENYTOIN LEVEL, TOTAL: Phenytoin Lvl: 14.5 ug/mL (ref 10.0–20.0)

## 2022-08-21 MED ORDER — CYCLOSPORINE 0.05 % OP EMUL
1.0000 [drp] | Freq: Two times a day (BID) | OPHTHALMIC | Status: DC
  Administered 2022-08-21 – 2022-08-26 (×11): 1 [drp] via OPHTHALMIC
  Filled 2022-08-21 (×13): qty 30

## 2022-08-21 MED ORDER — POLYVINYL ALCOHOL 1.4 % OP SOLN
1.0000 [drp] | Freq: Two times a day (BID) | OPHTHALMIC | Status: DC
  Administered 2022-08-21 – 2022-08-26 (×10): 1 [drp] via OPHTHALMIC
  Filled 2022-08-21: qty 15

## 2022-08-21 MED ORDER — VENLAFAXINE HCL ER 37.5 MG PO CP24
37.5000 mg | ORAL_CAPSULE | Freq: Every day | ORAL | Status: DC
  Administered 2022-08-21 – 2022-08-25 (×5): 37.5 mg via ORAL
  Filled 2022-08-21 (×7): qty 1

## 2022-08-21 MED ORDER — ROSUVASTATIN CALCIUM 20 MG PO TABS
40.0000 mg | ORAL_TABLET | Freq: Every day | ORAL | Status: DC
  Administered 2022-08-21 – 2022-08-25 (×5): 40 mg via ORAL
  Filled 2022-08-21 (×5): qty 2

## 2022-08-21 MED ORDER — MAGNESIUM SULFATE 2 GM/50ML IV SOLN
INTRAVENOUS | Status: AC
  Filled 2022-08-21: qty 100

## 2022-08-21 MED ORDER — PANTOPRAZOLE SODIUM 40 MG PO TBEC
40.0000 mg | DELAYED_RELEASE_TABLET | Freq: Every day | ORAL | Status: DC
  Administered 2022-08-21 – 2022-08-26 (×6): 40 mg via ORAL
  Filled 2022-08-21 (×6): qty 1

## 2022-08-21 MED ORDER — WARFARIN SODIUM 4 MG PO TABS
4.0000 mg | ORAL_TABLET | Freq: Once | ORAL | Status: AC
  Administered 2022-08-21: 4 mg via ORAL
  Filled 2022-08-21: qty 1

## 2022-08-21 MED ORDER — POTASSIUM CHLORIDE 10 MEQ/100ML IV SOLN
10.0000 meq | INTRAVENOUS | Status: AC
  Administered 2022-08-21 (×4): 10 meq via INTRAVENOUS
  Filled 2022-08-21 (×4): qty 100

## 2022-08-21 MED ORDER — TORSEMIDE 20 MG PO TABS
40.0000 mg | ORAL_TABLET | Freq: Two times a day (BID) | ORAL | Status: DC
  Administered 2022-08-21 – 2022-08-26 (×11): 40 mg via ORAL
  Filled 2022-08-21 (×11): qty 2

## 2022-08-21 MED ORDER — EMPAGLIFLOZIN 10 MG PO TABS
10.0000 mg | ORAL_TABLET | Freq: Every day | ORAL | Status: DC
  Administered 2022-08-21 – 2022-08-26 (×6): 10 mg via ORAL
  Filled 2022-08-21 (×6): qty 1

## 2022-08-21 MED ORDER — PHENYTOIN SODIUM EXTENDED 100 MG PO CAPS
200.0000 mg | ORAL_CAPSULE | Freq: Two times a day (BID) | ORAL | Status: DC
  Administered 2022-08-22 – 2022-08-26 (×9): 200 mg via ORAL
  Filled 2022-08-21 (×10): qty 2

## 2022-08-21 MED ORDER — POTASSIUM CHLORIDE CRYS ER 20 MEQ PO TBCR
20.0000 meq | EXTENDED_RELEASE_TABLET | ORAL | Status: AC
  Administered 2022-08-21 (×2): 20 meq via ORAL
  Filled 2022-08-21 (×2): qty 1

## 2022-08-21 MED ORDER — PHENYTOIN 50 MG PO CHEW
50.0000 mg | CHEWABLE_TABLET | Freq: Every day | ORAL | Status: DC
  Administered 2022-08-22 – 2022-08-25 (×4): 50 mg via ORAL
  Filled 2022-08-21 (×5): qty 1

## 2022-08-21 MED ORDER — SPIRONOLACTONE 25 MG PO TABS
25.0000 mg | ORAL_TABLET | Freq: Every day | ORAL | Status: DC
  Administered 2022-08-21 – 2022-08-26 (×6): 25 mg via ORAL
  Filled 2022-08-21 (×6): qty 1

## 2022-08-21 MED ORDER — POTASSIUM CHLORIDE CRYS ER 20 MEQ PO TBCR
40.0000 meq | EXTENDED_RELEASE_TABLET | Freq: Every day | ORAL | Status: DC
  Administered 2022-08-22 – 2022-08-26 (×5): 40 meq via ORAL
  Filled 2022-08-21 (×5): qty 2

## 2022-08-21 NOTE — Progress Notes (Signed)
Transfer from 23M  Arrived in bed, placed on tele monitoring vitals taken call bell in reach.

## 2022-08-21 NOTE — Progress Notes (Signed)
[facility] ADULT ICU REPLACEMENT PROTOCOL   The patient does apply for the [facility] Adult ICU Electrolyte Replacment Protocol based on the criteria listed below:   1.Exclusion criteria: TCTS, ECMO, Dialysis, and Myasthenia Gravis patients 2. Is GFR >/= 30 ml/min? Yes.    Patient's GFR today is >60 3. Is SCr </= 2? Yes.   Patient's SCr is 0.72 mg/dL 4. Did SCr increase >/= 0.5 in 24 hours? No 5.Pt's weight >40kg  Yes.   6. Abnormal electrolyte(s): potassium 3.2  7. Electrolytes replaced per protocol 8.  Call MD STAT for K+ </= 2.5, Phos </= 1, or Mag </= 1 Physician:  protocol  Darlys Gales 08/21/2022 4:00 AM

## 2022-08-21 NOTE — Progress Notes (Signed)
  ANTICOAGULATION CONSULT NOTE - Initial Consult  Pharmacy Consult for warfarin Indication:  Protein C and S def and + lupus anticoagulant, atrial fibrillation   Allergies  Allergen Reactions   Fenofibrate Other (See Comments)    Per MAR    Patient Measurements: Height: 6\' 1"  (185.4 cm) Weight: (!) 136.9 kg (301 lb 13 oz) IBW/kg (Calculated) : 79.9   Vital Signs: Temp: 98.4 F (36.9 C) (03/14 0734) Temp Source: Axillary (03/14 0734) BP: 131/81 (03/14 0500) Pulse Rate: 90 (03/14 0600)  Labs: Recent Labs    08/19/22 1010 08/19/22 1508 08/20/22 0134 08/20/22 0851 08/20/22 0906 08/21/22 0115  HGB 11.4*  --  13.1  --  12.6* 12.2*  HCT 37.5*  --  41.6  --  37.0* 38.0*  PLT 182  --  185  --   --  190  LABPROT 24.3*  --   --  23.9*  --  20.7*  INR 2.2*  --   --  2.2*  --  1.8*  CREATININE 0.48*  --  0.73  --   --  0.72  TROPONINIHS 21* 20*  --   --   --   --      Estimated Creatinine Clearance: 135.5 mL/min (by C-G formula based on SCr of 0.72 mg/dL).   Medical History: Past Medical History:  Diagnosis Date   Acute on chronic congestive heart failure (HCC)    Anemia, secondary    SECONDARY TO ACUTE BLOOD LOSS   Anxiety disorder    Bloody stool 08/29/2011   intermittent along with constipation.    CVA (cerebral vascular accident) ([place]) 2010   large left MCA stroke with right hemiparesis   Depression    DVT of lower extremity (deep venous thrombosis) ([place])    RIGHT LOWER; s/p IVC filter 7/12   Dysphagia    Hyperlipidemia    Hypertension    Lupus anticoagulant disorder ([place]) 1990   Morbid obesity (HCC)    PFO (patent foramen ovale)    TEE 2/10: EF 60%, trivial AI, mild Ao root dilatation, mod PFO with R-L shunting, atrial septal aneurysm;   echo 7/12: EF 65-70%, grade 1 diast dysfxn, mild MR, LVOT showed severe obstruction   Protein C deficiency (HCC)    Protein S deficiency (HCC)    Rectus sheath hematoma 7/12   required reversal of anticoagulation and c/b  DVT req. IVC filter   Seizure disorder (HCC)     Assessment: 65 YOM with hx afib and protein C and protein S deficiency, lupus anticoagulant (PTT-LA) positive on warfarin PTA. Home regimen appears to be 4 mg daily except 4.5 mg MWF per recent outpatient cardiology visit on 08/01/22, however nursing home [facility] reflects regimen is 4.5 mg daily.   INR 1.8 today subtherapeutic. Hgb 12.2 stable, PLT 190 wnl  Last dose of warfarin prior to admission. Patient started on azithromycin and ceftriaxone for suspected CAP, transitioned to doxycycline 3/13 for 5 days total. Will closely watch INR given DDI.   Goal of Therapy:  INR 2-3 Monitor platelets by anticoagulation protocol: Yes   Plan:  Warfarin 4 mg x 1 Daily CBC, INR Monitor s/sx bleeding   Eliseo Gum, PharmD PGY1 Pharmacy Resident   08/21/2022  9:06 AM

## 2022-08-21 NOTE — Evaluation (Signed)
One time Physical Therapy Evaluation Patient Details Name: James Villegas MRN: [id number] DOB: 09/15/1955 Today's Date: 08/21/2022  History of Present Illness  Patient is a 67 y.o. male presented to [facility] ED on 08/19/22 with c/o SOB and vomiting. While in ED, patient removed his oxygen and saturations dipped as low as 35%, at which point he became minimally responsive. Further workup suggestive of acute hypoxic and hypercapnic respiratory failure likely due to underlying OSA/OHS. He was admitted and treated with CPAP/BiPAP, after which he showed improvement. CXR showed patchy left lung opacities which may reflect pulmonary edema and increased left basilar linear opacity, favor atelectasis.  PMH: CVA with residual right-sided hemiplegia, dysphagia, lupus, CHF, afib, suspected OSA/OHS. He resides at a SNF.  Clinical Impression  Pt admitted with above diagnosis. Pt is at baseline and was lifted with hoyer lift for last 2 years.  Is not appropriate for PT and will sign off.  Asked nursing to lift pt OOB at least daily with Maxi sky.  Sign off.     Recommendations for follow up therapy are one component of a multi-disciplinary discharge planning process, led by the attending physician.  Recommendations may be updated based on patient status, additional functional criteria and insurance authorization.  Follow Up Recommendations Long-term institutional care without follow-up therapy Can patient physically be transported by private vehicle: No    Assistance Recommended at Discharge Frequent or constant Supervision/Assistance  Patient can return home with the following  Two people to help with walking and/or transfers;Two people to help with bathing/dressing/bathroom;Assistance with cooking/housework;Assist for transportation;Help with stairs or ramp for entrance    Equipment Recommendations None recommended by PT  Recommendations for Other Services       Functional Status Assessment Patient has not had  a recent decline in their functional status     Precautions / Restrictions Precautions Precautions: Fall Restrictions Weight Bearing Restrictions: Yes      Mobility  Bed Mobility Overal bed mobility: Needs Assistance Bed Mobility: Rolling Rolling:  (see below)         General bed mobility comments: Turns right with rail with Modif I; max assist to turn left    Transfers                   General transfer comment: Hasnt transferred physically in 2 years for safety - hoyer lift used    Ambulation/Gait               General Gait Details: N/A  Financial trader Rankin (Stroke Patients Only)       Balance                                             Pertinent Vitals/Pain Pain Assessment Pain Assessment: No/denies pain    Home Living Family/patient expects to be discharged to:: Skilled nursing facility                   Additional Comments: Resides at [facility] for the past 1.5 years.    Prior Function Prior Level of Function : Needs assist             Mobility Comments: hoyer lift to transfer to power w/c, able to mobilize with power w/c using LUE.  Pt sits up 11 am  to 4 pm daily ADLs Comments: Assist for ADLs.     Hand Dominance   Dominant Hand: Left    Extremity/Trunk Assessment   Upper Extremity Assessment Upper Extremity Assessment: Defer to OT evaluation    Lower Extremity Assessment Lower Extremity Assessment: RLE deficits/detail;LLE deficits/detail RLE Deficits / Details: no active movement LLE Deficits / Details: grossly 2+/5       Communication   Communication: Expressive difficulties (baseline per previous notes)  Cognition Arousal/Alertness: Awake/alert Behavior During Therapy: WFL for tasks assessed/performed Overall Cognitive Status: Within Functional Limits for tasks assessed                                          General  Comments General comments (skin integrity, edema, etc.): 92 bpm, 99% 8LO2, 128/70    Exercises     Assessment/Plan    PT Assessment All further PT needs can be met in the next venue of care  PT Problem List Decreased mobility;Decreased activity tolerance;Decreased safety awareness       PT Treatment Interventions      PT Goals (Current goals can be found in the Care Plan section)  Acute Rehab PT Goals PT Goal Formulation: All assessment and education complete, DC therapy    Frequency       Co-evaluation               AM-PAC PT "6 Clicks" Mobility  Outcome Measure Help needed turning from your back to your side while in a flat bed without using bedrails?: Total Help needed moving from lying on your back to sitting on the side of a flat bed without using bedrails?: Total Help needed moving to and from a bed to a chair (including a wheelchair)?: Total Help needed standing up from a chair using your arms (e.g., wheelchair or bedside chair)?: Total Help needed to walk in hospital room?: Total Help needed climbing 3-5 steps with a railing? : Total 6 Click Score: 6    End of Session Equipment Utilized During Treatment: Oxygen Activity Tolerance: Patient limited by fatigue Patient left: in bed;with call bell/phone within reach;with bed alarm set Nurse Communication: Mobility status;Need for lift equipment (use Maxi sky to get pt up daily) PT Visit Diagnosis: Muscle weakness (generalized) (M62.81)    Time: [id number] PT Time Calculation (min) (ACUTE ONLY): 26 min   Charges:   PT Evaluation $PT Eval Low Complexity: 1 Low PT Treatments $Therapeutic Activity: 8-22 mins        [facility] M,PT Acute Rehab Services [place] 08/21/2022, 1:56 PM

## 2022-08-21 NOTE — Evaluation (Signed)
Clinical/Bedside Swallow Evaluation Patient Details  Name: James Villegas MRN: [id number] Date of Birth: 1955-12-11  Today's Date: 08/21/2022 Time: SLP Start Time (ACUTE ONLY): 0950 SLP Stop Time (ACUTE ONLY): 1005 SLP Time Calculation (min) (ACUTE ONLY): 15 min  Past Medical History:  Past Medical History:  Diagnosis Date   Acute on chronic congestive heart failure (HCC)    Anemia, secondary    SECONDARY TO ACUTE BLOOD LOSS   Anxiety disorder    Bloody stool 08/29/2011   intermittent along with constipation.    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   Depression    DVT of lower extremity (deep venous thrombosis) ([place])    RIGHT LOWER; s/p IVC filter 7/12   Dysphagia    Hyperlipidemia    Hypertension    Lupus anticoagulant disorder ([place]) 1990   Morbid obesity (HCC)    PFO (patent foramen ovale)    TEE 2/10: EF 60%, trivial AI, mild Ao root dilatation, mod PFO with R-L shunting, atrial septal aneurysm;   echo 7/12: EF 65-70%, grade 1 diast dysfxn, mild MR, LVOT showed severe obstruction   Protein C deficiency (HCC)    Protein S deficiency (HCC)    Rectus sheath hematoma 7/12   required reversal of anticoagulation and c/b DVT req. IVC filter   Seizure disorder [facility])    Past Surgical History:  Past Surgical History:  Procedure Laterality Date   dental extraction     multiple   vena cavogram  12/2010   INFERIOR   HPI:  Patient is a 67 y.o. male with PMH: CVA with residual right-sided hemiplegia, dysphagia, lupus, CHF, afib, suspected OSA/OHS. He resides at a SNF. He presented to [facility] ED on 08/19/22 with c/o SOB and vomiting. While in ED, patient removed his oxygen and saturations dipped as low as 35%, at which point he became minimally responsive. Further workup suggestive of acute hypoxic and hypercapnic respiratory failure likely due to underlying OSA/OHS. He was admitted and treated with CPAP/BiPAP, after which he showed  improvement. CXR showed patchy left lung opacities which may reflect pulmonary edema and increased left basilar linear opacity, favor atelectasis.    Assessment / Plan / Recommendation  Clinical Impression  Patient is not currently presenting with clinical s/s of dysphagia as per this bedside swallow evaluation.  He denied any current or recent difficulties and his RN today informed SLP that patient swallowed his pills without difficulty and she also did not observe any difficulty with his PO intake at breakfast. He has been evaluated by SLP at bedside during three separate hospitalizations (2020, 2021 and 2023) each of those times, his oropharyngeal swallow function was assessed to be [facility]. He does have h/o GERD/esophageal deficits which is the likely cause or main contributor of his recent incidents of nausea and vomiting. Patient has been counseled previously by SLP not to lie down flat immediately after eating/drinking. During today's evaluation, patient was awake, alert and SpO2 above 95% and RR below in low to mid-20's. He consumed straw sips of water and as well as a cup of applesauce. No change in vitals observed, swallow initiation appeared timely and patient did not exhibit any overt s/s aspiration or penetration. As long as patient's oxygen saturations and RR are well maintained with supplemental oxygen, patient appears safe to continue with regular texture solids, thin liquids. SLP also advises patient to keep HOB elevated at least >30 degrees for at least 30 minutes after  eating/drinking. SLP not recommending further skilled treatment at this time. SLP Visit Diagnosis: Dysphagia, unspecified (R13.10)    Aspiration Risk  Mild aspiration risk    Diet Recommendation Regular;Thin liquid   Liquid Administration via: Cup;Straw Medication Administration: Whole meds with liquid Supervision: Patient able to self feed Compensations: Slow rate;Small sips/bites Postural Changes: Seated upright at 90  degrees;Remain upright for at least 30 minutes after po intake    Other  Recommendations Oral Care Recommendations: Oral care BID    Recommendations for follow up therapy are one component of a multi-disciplinary discharge planning process, led by the attending physician.  Recommendations may be updated based on patient status, additional functional criteria and insurance authorization.  Follow up Recommendations No SLP follow up      Assistance Recommended at Discharge    Functional Status Assessment Patient has not had a recent decline in their functional status  Frequency and Duration     N/A       Prognosis   N/A     Swallow Study   General Date of Onset: 08/20/22 HPI: Patient is a 67 y.o. male with PMH: CVA with residual right-sided hemiplegia, dysphagia, lupus, CHF, afib, suspected OSA/OHS. He resides at a SNF. He presented to [facility] ED on 08/19/22 with c/o SOB and vomiting. While in ED, patient removed his oxygen and saturations dipped as low as 35%, at which point he became minimally responsive. Further workup suggestive of acute hypoxic and hypercapnic respiratory failure likely due to underlying OSA/OHS. He was admitted and treated with CPAP/BiPAP, after which he showed improvement. CXR showed patchy left lung opacities which may reflect pulmonary edema and increased left basilar linear opacity, favor atelectasis. Type of Study: Bedside Swallow Evaluation Previous Swallow Assessment: during previous hospitalization, BSE in 2023, 2021, and in 2020 Diet Prior to this Study: Regular;Thin liquids (Level 0) Temperature Spikes Noted: No Respiratory Status: Nasal cannula History of Recent Intubation: No Behavior/Cognition: Alert;Cooperative;Pleasant mood Oral Cavity Assessment: Within Functional Limits Oral Care Completed by SLP: No Oral Cavity - Dentition: Adequate natural dentition Vision: Functional for self-feeding Self-Feeding Abilities: Able to feed self Patient Positioning:  Upright in bed Baseline Vocal Quality: Other (comment) (normal voice, decreased respiratory support which patient compensates for by pausing frequently) Volitional Cough: Strong Volitional Swallow: Able to elicit    Oral/Motor/Sensory Function Overall Oral Motor/Sensory Function: Mild impairment Facial ROM: Reduced right Facial Strength: Reduced right Velum: Within Functional Limits Mandible: Within Functional Limits   Ice Chips Ice chips: Not tested   Thin Liquid Thin Liquid: Within functional limits Presentation: Straw    Nectar Thick     Honey Thick     Puree Puree: Within functional limits Presentation: Self Fed;Spoon   Solid     Solid: Not tested Other Comments: Patient declined to have any soft solids at this time      Sonia Baller, MA, CCC-SLP Speech Therapy

## 2022-08-21 NOTE — Progress Notes (Signed)
PROGRESS NOTE                                                                                                                                                                                                             Patient Demographics:    James Villegas, is a 67 y.o. male, DOB - 1956-03-30, [id number]  Outpatient Primary MD for the patient is Raymondo Band, MD    LOS - 2  Admit date - 08/19/2022    Chief Complaint  Patient presents with   Shortness of Breath       Brief Narrative (HPI from H&P)   68 year old male with past medical history as below, which is significant for stroke with residual right-sided hemiplegia, dysphagia, lupus, CHF, atrial fibrillation, hypercoagulable disorder with protein C and protein S deficiencies. And suspected OSA/OHS, although patient had never received a formal sleep study despite multiple efforts. The patient resides in SNF from where he presented to [facility] ED on 3/12 with complaints of shortness of breath and vomiting. While in the emergency department the patient removed his oxygen and had oxygen saturations dipped to as low as 35% at which point he became minimally responsive.   His further workup was suggestive of acute hypoxic and hypercapnic respiratory failure likely due to underlying OSA OHS.  He was kept in the hospital treated with CPAP/BiPAP after which he showed improvement he was transitioned to my care on 08/21/2022 on day 2 of his hospital stay.   Subjective:    [place] today has, No headache, No chest pain, No abdominal pain - No Nausea, No new weakness tingling or numbness, no SOB   Assessment  & Plan :    Acute on chronic hypoxic and hypercapnic respiratory failure causing severe metabolic encephalopathy in the setting of underlying undiagnosed OSA/OHS.  Patient much improved after wearing BiPAP, ABGs have showed good compensation and improvement, daytime  oxygen and nighttime CPAP/BiPAP will be continued, outpatient sleep study.  Will like to see him qualify for noninvasive ventilatory device upon discharge to SNF.  He has been counseled on compliance.  He is agreeable.  Continue to monitor closely.  He understands that if he does not wear CPAP/BiPAP he will be a candidate for hospice.  And was discussed with patient and patient's sister over the phone.  They are in  agreement.  Morbid obesity with OSA OHS.  Plan as above.  PCP to follow for weight loss, BMI close to 40.  Acute on chronic diastolic CHF EF [id number] on recent echocardiogram.  Has evidence of fluid overload.  Placed on higher than home dose diuretics and potassium supplementation, diurese and monitor.  Chronic atrial fibrillation.  Mali vas 2 score of greater than 4.  Not on rate controlling agents, continue Coumadin per pharmacy.  History of hypercoagulable state with protein C&S deficiency.  Coumadin per pharmacy.  History of stroke with right-sided hemiplegia, dysphagia and dysarthria.  Speech to follow, PT OT, supportive care.  On Coumadin along with statin and Zetia for secondary prevention.  Dyslipidemia.  Statin and Zetia.  History of lupus.  Supportive care.  Seizure disorder.  Pharmacy monitoring Dilantin dose and levels.  They have been consulted by me on 08/21/2022 [facility]).       Condition -   Guarded  Family Communication  : Called Sister Manuela Schwartz [phone] on 08/21/2022  Code Status :  DNR  Consults  :  PCCM  PUD Prophylaxis : PPI   Procedures  :            Disposition Plan  :    Status is: Inpatient  DVT Prophylaxis  :    Coumadin  Lab Results  Component Value Date   INR 1.8 (H) 08/21/2022   INR 2.2 (H) 08/20/2022   INR 2.2 (H) 08/19/2022   PROTIME 36.0 (H) 12/27/2012   PROTIME 43.2 (H) 04/26/2012   PROTIME 32.4 (H) 12/29/2011    Lab Results  Component Value Date   PLT 190 08/21/2022    Diet :  Diet Order             Diet heart  healthy/carb modified Room service appropriate? Yes; Fluid consistency: Thin; Fluid restriction: 1500 mL Fluid  Diet effective now                    Inpatient Medications  Scheduled Meds:  Chlorhexidine Gluconate Cloth  6 each Topical Daily   cycloSPORINE  1 drop Both Eyes BID   doxycycline  100 mg Oral Q12H   empagliflozin  10 mg Oral Daily   ezetimibe  10 mg Oral Daily   mouth rinse  15 mL Mouth Rinse 4 times per day   pantoprazole  40 mg Oral Daily   polyvinyl alcohol  1 drop Both Eyes BID   [START ON 08/22/2022] potassium chloride SA  40 mEq Oral Daily   rosuvastatin  40 mg Oral QHS   spironolactone  25 mg Oral Daily   torsemide  40 mg Oral BID   venlafaxine XR  37.5 mg Oral QHS   Warfarin - Pharmacist Dosing Inpatient   Does not apply q1600   Continuous Infusions: PRN Meds:.docusate sodium, mouth rinse, polyethylene glycol    Objective:   Vitals:   08/21/22 0400 08/21/22 0500 08/21/22 0600 08/21/22 0734  BP: 118/85 131/81    Pulse: 90 86 90   Resp: 18 17 (!) 21   Temp:    98.4 F (36.9 C)  TempSrc:    Axillary  SpO2: 100% 99% 99%   Weight:  (!) 136.9 kg    Height:        Wt Readings from Last 3 Encounters:  08/21/22 (!) 136.9 kg  08/01/22 (!) 137 kg  01/23/22 (!) 137 kg     Intake/Output Summary (Last 24 hours) at 08/21/2022 [id number] Last data filed  at 08/21/2022 0834 Gross per 24 hour  Intake 1704 ml  Output 2050 ml  Net -346 ml     Physical Exam  Awake Alert, underlying right-sided weakness from previous stroke strength 1/5, underlying dysarthria from previous stroke, 2+ leg edema and left leg 3+ and right leg [place].AT,PERRAL Supple Neck, No JVD,   Symmetrical Chest wall movement, Good air movement bilaterally, CTAB RRR,No Gallops,Rubs or new Murmurs,  +ve B.Sounds, Abd Soft, No tenderness,   2+ leg edema and left leg 3+ and right leg    RN pressure injury documentation: Pressure Injury 02/20/20 Buttocks Mid;Right;Left Stage 2 -  Partial  thickness loss of dermis presenting as a shallow open injury with a red, pink wound bed without slough. (Active)  02/20/20 2100  Location: Buttocks  Location Orientation: Mid;Right;Left  Staging: Stage 2 -  Partial thickness loss of dermis presenting as a shallow open injury with a red, pink wound bed without slough.  Wound Description (Comments):   Present on Admission: Yes     Pressure Injury 09/16/21 Buttocks Right;Left Stage 1 -  Intact skin with non-blanchable redness of a localized area usually over a bony prominence. non-blanchable reddness (Active)  09/16/21 1155  Location: Buttocks  Location Orientation: Right;Left  Staging: Stage 1 -  Intact skin with non-blanchable redness of a localized area usually over a bony prominence.  Wound Description (Comments): non-blanchable reddness  Present on Admission: Yes      Data Review:    Recent Labs  Lab 08/19/22 1010 08/20/22 0134 08/20/22 0906 08/21/22 0115  WBC 8.8 6.9  --  6.2  HGB 11.4* 13.1 12.6* 12.2*  HCT 37.5* 41.6 37.0* 38.0*  PLT 182 185  --  190  MCV 101.9* 98.3  --  96.4  MCH 31.0 31.0  --  31.0  MCHC 30.4 31.5  --  32.1  RDW 13.7 13.4  --  13.8  LYMPHSABS 0.4*  --   --   --   MONOABS 0.8  --   --   --   EOSABS 0.0  --   --   --   BASOSABS 0.0  --   --   --     Recent Labs  Lab 08/19/22 1010 08/19/22 1034 08/19/22 1508 08/20/22 0134 08/20/22 0851 08/20/22 0906 08/21/22 0115  NA 144  --   --  141  --  142 141  K 3.6  --   --  4.2  --  3.3* 3.2*  CL 102  --   --  95*  --   --  95*  CO2 32  --   --  33*  --   --  34*  ANIONGAP 10  --   --  13  --   --  12  GLUCOSE 133*  --   --  81  --   --  87  BUN 8  --   --  14  --   --  15  CREATININE 0.48*  --   --  0.73  --   --  0.72  AST 11*  --   --   --   --   --  24  ALT 13  --   --   --   --   --  15  ALKPHOS 87  --   --   --   --   --  88  BILITOT 0.2*  --   --   --   --   --  0.4  ALBUMIN 2.5*  --   --   --   --   --  2.8*  PROCALCITON  --   --   <0.10  --   --   --   --   INR 2.2*  --   --   --  2.2*  --  1.8*  BNP  --  233.8* 219.0*  --   --   --   --   MG  --   --   --  2.2  --   --  2.1  CALCIUM 7.3*  --   --  9.0  --   --  8.4*      Recent Labs  Lab 08/19/22 1010 08/19/22 1034 08/19/22 1508 08/20/22 0134 08/20/22 0851 08/21/22 0115  PROCALCITON  --   --  <0.10  --   --   --   INR 2.2*  --   --   --  2.2* 1.8*  BNP  --  233.8* 219.0*  --   --   --   MG  --   --   --  2.2  --  2.1  CALCIUM 7.3*  --   --  9.0  --  8.4*      Radiology Reports DG Chest Portable 1 View  Result Date: 08/19/2022 CLINICAL DATA:  Shortness of breath. EXAM: PORTABLE CHEST 1 VIEW COMPARISON:  Chest x-ray dated September 16, 2021. FINDINGS: Unchanged cardiomegaly, pulmonary vascular congestion, and mild diffuse interstitial thickening. Unchanged small bilateral pleural effusions. No pneumothorax. No acute osseous abnormality. IMPRESSION: 1. Unchanged mild congestive heart failure. Electronically Signed   By: Titus Dubin M.D.   On: 08/19/2022 11:00      Signature  -   Lala Lund M.D on 08/21/2022 at 9:04 AM   -  To page go to [email or web address]

## 2022-08-22 DIAGNOSIS — J9601 Acute respiratory failure with hypoxia: Secondary | ICD-10-CM | POA: Diagnosis not present

## 2022-08-22 DIAGNOSIS — J9602 Acute respiratory failure with hypercapnia: Secondary | ICD-10-CM | POA: Diagnosis not present

## 2022-08-22 LAB — CBC
HCT: 42 % (ref 39.0–52.0)
Hemoglobin: 13.5 g/dL (ref 13.0–17.0)
MCH: 31 pg (ref 26.0–34.0)
MCHC: 32.1 g/dL (ref 30.0–36.0)
MCV: 96.3 fL (ref 80.0–100.0)
Platelets: 208 10*3/uL (ref 150–400)
RBC: 4.36 MIL/uL (ref 4.22–5.81)
RDW: 13.4 % (ref 11.5–15.5)
WBC: 7.3 10*3/uL (ref 4.0–10.5)
nRBC: 0 % (ref 0.0–0.2)

## 2022-08-22 LAB — BASIC METABOLIC PANEL
Anion gap: 10 (ref 5–15)
BUN: 13 mg/dL (ref 8–23)
CO2: 35 mmol/L — ABNORMAL HIGH (ref 22–32)
Calcium: 8.9 mg/dL (ref 8.9–10.3)
Chloride: 94 mmol/L — ABNORMAL LOW (ref 98–111)
Creatinine, Ser: 0.59 mg/dL — ABNORMAL LOW (ref 0.61–1.24)
GFR, Estimated: >60 mL/min (ref >60)
Glucose, Bld: 110 mg/dL — ABNORMAL HIGH (ref 70–99)
Potassium: 4 mmol/L (ref 3.5–5.1)
Sodium: 139 mmol/L (ref 135–145)

## 2022-08-22 LAB — PROTIME-INR
INR: 1.4 — ABNORMAL HIGH (ref 0.8–1.2)
Prothrombin Time: 17 seconds — ABNORMAL HIGH (ref 11.4–15.2)

## 2022-08-22 LAB — PROCALCITONIN: Procalcitonin: <0.1 ng/mL

## 2022-08-22 LAB — C-REACTIVE PROTEIN: CRP: 6.7 mg/dL — ABNORMAL HIGH (ref <1.0)

## 2022-08-22 LAB — MAGNESIUM: Magnesium: 2.1 mg/dL (ref 1.7–2.4)

## 2022-08-22 LAB — BRAIN NATRIURETIC PEPTIDE: B Natriuretic Peptide: 185.9 pg/mL — ABNORMAL HIGH (ref 0.0–100.0)

## 2022-08-22 MED ORDER — WARFARIN SODIUM 5 MG PO TABS
5.0000 mg | ORAL_TABLET | Freq: Once | ORAL | Status: AC
  Administered 2022-08-22: 5 mg via ORAL
  Filled 2022-08-22: qty 1

## 2022-08-22 MED ORDER — ENOXAPARIN SODIUM 150 MG/ML IJ SOSY
1.0000 mg/kg | PREFILLED_SYRINGE | Freq: Two times a day (BID) | INTRAMUSCULAR | Status: DC
  Administered 2022-08-22 (×2): 138 mg via SUBCUTANEOUS
  Filled 2022-08-22 (×3): qty 0.92

## 2022-08-22 NOTE — Progress Notes (Signed)
PROGRESS NOTE                                                                                                                                                                                                             Patient Demographics:    James Villegas, is a 67 y.o. male, DOB - Apr 24, 1956, [id number]  Outpatient Primary MD for the patient is Raymondo Band, MD    LOS - 3  Admit date - 08/19/2022    Chief Complaint  Patient presents with   Shortness of Breath       Brief Narrative (HPI from H&P)   67 year old male with past medical history as below, which is significant for stroke with residual right-sided hemiplegia, dysphagia, lupus, CHF, atrial fibrillation, hypercoagulable disorder with protein C and protein S deficiencies. And suspected OSA/OHS, although patient had never received a formal sleep study despite multiple efforts. The patient resides in SNF from where [facility] on [facility]/12 with complaints of shortness of breath and vomiting. While in the emergency department the patient removed his oxygen and had oxygen saturations dipped to as low as 35% at which point he became minimally responsive.   His further workup was suggestive of acute hypoxic and hypercapnic respiratory failure likely due to underlying OSA OHS.  He was kept in the hospital treated with CPAP/BiPAP after which he showed improvement he was transitioned to my care on 08/21/2022 on day 2 of his hospital stay.   Subjective:   Patient in bed, appears comfortable, denies any headache, no fever, no chest pain or pressure, no shortness of breath , no abdominal pain. No new focal weakness.   Assessment  & Plan :   Acute on chronic hypoxic and hypercapnic respiratory failure causing severe metabolic encephalopathy in the setting of underlying undiagnosed OSA/OHS.  Worsened by fluid overload due to CHF.  Patient much improved after  wearing BiPAP, ABGs have showed good compensation and improvement, daytime oxygen and nighttime CPAP/BiPAP will be continued, outpatient sleep study.  Will like to see him qualify for noninvasive ventilatory device upon discharge to SNF.  He has been counseled on compliance.  He is agreeable.  Also he is remarkably better after diuresis on 08/21/2022, on 08/21/2022 despite not wearing BiPAP as it was not provided to him by the RT patient is  doing remarkably better on 08/22/2022 suggesting that there is a large component of his respiratory failure was due to fluid overload in conjunction with OSA/OHS.  Staff has been updated to make sure CPAP/BiPAP is provided to him on 08/22/2022.  Morbid obesity with OSA OHS.  Plan as above.  PCP to follow for weight loss, BMI close to 40.  Acute on chronic diastolic CHF EF [id number] on recent echocardiogram.  Has evidence of fluid overload.  Placed on higher than home dose diuretics and potassium supplementation, diurese and monitor.  Improved with ongoing diuresis.  Chronic atrial fibrillation.  Mali vas 2 score of greater than 4.  Not on rate controlling agents, continue Coumadin per pharmacy.  History of hypercoagulable state with protein C&S deficiency.  Coumadin per pharmacy.  History of stroke with right-sided hemiplegia, dysphagia and dysarthria.  Speech to follow, PT OT, supportive care.  On Coumadin along with statin and Zetia for secondary prevention.  Dyslipidemia.  Statin and Zetia.  History of lupus.  Supportive care.  Seizure disorder.  Pharmacy monitoring Dilantin dose and levels.  They have been consulted by me on 08/21/2022 Hildred Alamin).       Condition -   Guarded  Family Communication  : Called Sister Manuela Schwartz [phone] on 08/21/2022, 08/22/22  Code Status :  DNR  Consults  :  PCCM  PUD Prophylaxis : PPI   Procedures  :            Disposition Plan  :    Status is: Inpatient  DVT Prophylaxis  :    Coumadin  Lab Results  Component Value  Date   INR 1.4 (H) 08/22/2022   INR 1.8 (H) 08/21/2022   INR 2.2 (H) 08/20/2022   PROTIME 36.0 (H) 12/27/2012   PROTIME 43.2 (H) 04/26/2012   PROTIME 32.4 (H) 12/29/2011    Lab Results  Component Value Date   PLT 208 08/22/2022    Diet :  Diet Order             Diet heart healthy/carb modified Room service appropriate? Yes; Fluid consistency: Thin; Fluid restriction: 1500 mL Fluid  Diet effective now                    Inpatient Medications  Scheduled Meds:  Chlorhexidine Gluconate Cloth  6 each Topical Daily   cycloSPORINE  1 drop Both Eyes BID   doxycycline  100 mg Oral Q12H   empagliflozin  10 mg Oral Daily   enoxaparin (LOVENOX) injection  1 mg/kg Subcutaneous Q12H   ezetimibe  10 mg Oral Daily   mouth rinse  15 mL Mouth Rinse 4 times per day   pantoprazole  40 mg Oral Daily   phenytoin  200 mg Oral BID   And   phenytoin  50 mg Oral QHS   polyvinyl alcohol  1 drop Both Eyes BID   potassium chloride SA  40 mEq Oral Daily   rosuvastatin  40 mg Oral QHS   spironolactone  25 mg Oral Daily   torsemide  40 mg Oral BID   venlafaxine XR  37.5 mg Oral QHS   warfarin  5 mg Oral ONCE-1600   Warfarin - Pharmacist Dosing Inpatient   Does not apply q1600   Continuous Infusions: PRN Meds:.docusate sodium, mouth rinse, polyethylene glycol    Objective:   Vitals:   08/21/22 1825 08/21/22 2153 08/22/22 0553 08/22/22 0822  BP: 125/81 117/64 125/70 104/67  Pulse: (!) 103 69 99 (!) 53  Resp: 18 19 18 18   Temp: 99.6 F (37.6 C) 98.4 F (36.9 C) 98.6 F (37 C) 98 F (36.7 C)  TempSrc: Oral  Oral Oral  SpO2: 96% 95% 99% 96%  Weight:      Height:        Wt Readings from Last 3 Encounters:  08/21/22 (!) 136.9 kg  08/01/22 (!) 137 kg  01/23/22 (!) 137 kg     Intake/Output Summary (Last 24 hours) at 08/22/2022 1002 Last data filed at 08/22/2022 [id number] Gross per 24 hour  Intake 780 ml  Output 7300 ml  Net -6520 ml     Physical Exam  Awake Alert, underlying  right-sided weakness from previous stroke strength 1/5, underlying dysarthria from previous stroke, 2+ leg edema and left leg 3+ and right leg [place].AT,PERRAL Supple Neck, No JVD,   Symmetrical Chest wall movement, Good air movement bilaterally, few rales RRR,No Gallops,Rubs or new Murmurs,  +ve B.Sounds, Abd Soft, No tenderness,   1+ leg edema and left leg 3+ and right leg    RN pressure injury documentation: Pressure Injury 02/20/20 Buttocks Mid;Right;Left Stage 2 -  Partial thickness loss of dermis presenting as a shallow open injury with a red, pink wound bed without slough. (Active)  02/20/20 2100  Location: Buttocks  Location Orientation: Mid;Right;Left  Staging: Stage 2 -  Partial thickness loss of dermis presenting as a shallow open injury with a red, pink wound bed without slough.  Wound Description (Comments):   Present on Admission: Yes     Pressure Injury 09/16/21 Buttocks Right;Left Stage 1 -  Intact skin with non-blanchable redness of a localized area usually over a bony prominence. non-blanchable reddness (Active)  09/16/21 1155  Location: Buttocks  Location Orientation: Right;Left  Staging: Stage 1 -  Intact skin with non-blanchable redness of a localized area usually over a bony prominence.  Wound Description (Comments): non-blanchable reddness  Present on Admission: Yes      Data Review:    Recent Labs  Lab 08/19/22 1010 08/20/22 0134 08/20/22 0906 08/21/22 0115 08/22/22 0807  WBC 8.8 6.9  --  6.2 7.3  HGB 11.4* 13.1 12.6* 12.2* 13.5  HCT 37.5* 41.6 37.0* 38.0* 42.0  PLT 182 185  --  190 208  MCV 101.9* 98.3  --  96.4 96.3  MCH 31.0 31.0  --  31.0 31.0  MCHC 30.4 31.5  --  32.1 32.1  RDW 13.7 13.4  --  13.8 13.4  LYMPHSABS 0.4*  --   --   --   --   MONOABS 0.8  --   --   --   --   EOSABS 0.0  --   --   --   --   BASOSABS 0.0  --   --   --   --     Recent Labs  Lab 08/19/22 1010 08/19/22 1034 08/19/22 1508 08/20/22 0134 08/20/22 0851  08/20/22 0906 08/21/22 0115 08/21/22 0929 08/22/22 0807  NA 144  --   --  141  --  142 141  --  139  K 3.6  --   --  4.2  --  3.3* 3.2*  --  4.0  CL 102  --   --  95*  --   --  95*  --  94*  CO2 32  --   --  33*  --   --  34*  --  35*  ANIONGAP 10  --   --  13  --   --  12  --  10  GLUCOSE 133*  --   --  81  --   --  87  --  110*  BUN 8  --   --  14  --   --  15  --  13  CREATININE 0.48*  --   --  0.73  --   --  0.72  --  0.59*  AST 11*  --   --   --   --   --  24  --   --   ALT 13  --   --   --   --   --  15  --   --   ALKPHOS 87  --   --   --   --   --  88  --   --   BILITOT 0.2*  --   --   --   --   --  0.4  --   --   ALBUMIN 2.5*  --   --   --   --   --  2.8*  --   --   CRP  --   --   --   --   --   --   --  10.1* 6.7*  PROCALCITON  --   --  <0.10  --   --   --   --  <0.10  --   INR 2.2*  --   --   --  2.2*  --  1.8*  --  1.4*  TSH  --   --   --   --   --   --   --  1.550  --   BNP  --  233.8* 219.0*  --   --   --   --  156.1* 185.9*  MG  --   --   --  2.2  --   --  2.1  --  2.1  CALCIUM 7.3*  --   --  9.0  --   --  8.4*  --  8.9     Recent Labs  Lab 08/19/22 1010 08/19/22 1034 08/19/22 1508 08/20/22 0134 08/20/22 0851 08/21/22 0115 08/21/22 0929 08/22/22 0807  CRP  --   --   --   --   --   --  10.1* 6.7*  PROCALCITON  --   --  <0.10  --   --   --  <0.10  --   INR 2.2*  --   --   --  2.2* 1.8*  --  1.4*  TSH  --   --   --   --   --   --  1.550  --   BNP  --  233.8* 219.0*  --   --   --  156.1* 185.9*  MG  --   --   --  2.2  --  2.1  --  2.1  CALCIUM 7.3*  --   --  9.0  --  8.4*  --  8.9     Radiology Reports DG Chest Port 1 View  Result Date: 08/21/2022 CLINICAL DATA:  Shortness of breath EXAM: PORTABLE CHEST 1 VIEW COMPARISON:  Chest radiograph dated 08/19/2022 FINDINGS: Normal lung volumes. Similar asymmetric patchy left lung opacities. Increased left basilar linear opacity. Similar blunting of the bilateral costophrenic angles. No pneumothorax. Similar  cardiomediastinal silhouette. The visualized skeletal structures are unremarkable. IMPRESSION: 1. Similar asymmetric patchy left lung opacities, which may reflect pulmonary  edema. Increased left basilar linear opacity, favor atelectasis. 2. Similar blunting of the bilateral costophrenic angles, likely pleural effusion. Electronically Signed   By: Darrin Nipper M.D.   On: 08/21/2022 09:47   DG Chest Portable 1 View  Result Date: 08/19/2022 CLINICAL DATA:  Shortness of breath. EXAM: PORTABLE CHEST 1 VIEW COMPARISON:  Chest x-ray dated September 16, 2021. FINDINGS: Unchanged cardiomegaly, pulmonary vascular congestion, and mild diffuse interstitial thickening. Unchanged small bilateral pleural effusions. No pneumothorax. No acute osseous abnormality. IMPRESSION: 1. Unchanged mild congestive heart failure. Electronically Signed   By: Titus Dubin M.D.   On: 08/19/2022 11:00      Signature  -   Lala Lund M.D on 08/22/2022 at 10:02 AM   -  To page go to [email or web address]

## 2022-08-22 NOTE — Progress Notes (Addendum)
ANTICOAGULATION CONSULT NOTE - follow-up  Pharmacy Consult for warfarin Indication:  Protein C and S def and + lupus anticoagulant, atrial fibrillation   Allergies  Allergen Reactions   Fenofibrate Other (See Comments)    Per MAR    Patient Measurements: Height: 6\' 1"  (185.4 cm) Weight: (!) 136.9 kg (301 lb 13 oz) IBW/kg (Calculated) : 79.9   Vital Signs: Temp: 98 F (36.7 C) (03/15 0822) Temp Source: Oral (03/15 0822) BP: 104/67 (03/15 0822) Pulse Rate: 53 (03/15 0822)  Labs: Recent Labs    08/19/22 1010 08/19/22 1508 08/20/22 0134 08/20/22 0851 08/20/22 0906 08/21/22 0115 08/22/22 0807  HGB 11.4*  --  13.1  --  12.6* 12.2* 13.5  HCT 37.5*  --  41.6  --  37.0* 38.0* 42.0  PLT 182  --  185  --   --  190 208  LABPROT 24.3*  --   --  23.9*  --  20.7* 17.0*  INR 2.2*  --   --  2.2*  --  1.8* 1.4*  CREATININE 0.48*  --  0.73  --   --  0.72 0.59*  TROPONINIHS 21* 20*  --   --   --   --   --      Estimated Creatinine Clearance: 135.5 mL/min (A) (by C-G formula based on SCr of 0.59 mg/dL (L)).   Medical History: Past Medical History:  Diagnosis Date   Acute on chronic congestive heart failure (HCC)    Anemia, secondary    SECONDARY TO ACUTE BLOOD LOSS   Anxiety disorder    Bloody stool 08/29/2011   intermittent along with constipation.    CVA (cerebral vascular accident) ([place]) 2010   large left MCA stroke with right hemiparesis   Depression    DVT of lower extremity (deep venous thrombosis) ([place])    RIGHT LOWER; s/p IVC filter 7/12   Dysphagia    Hyperlipidemia    Hypertension    Lupus anticoagulant disorder ([place]) 1990   Morbid obesity (HCC)    PFO (patent foramen ovale)    TEE 2/10: EF 60%, trivial AI, mild Ao root dilatation, mod PFO with R-L shunting, atrial septal aneurysm;   echo 7/12: EF 65-70%, grade 1 diast dysfxn, mild MR, LVOT showed severe obstruction   Protein C deficiency (HCC)    Protein S deficiency (HCC)    Rectus sheath hematoma 7/12    required reversal of anticoagulation and c/b DVT req. IVC filter   Seizure disorder (HCC)     Assessment: 46 YOM with hx afib and protein C and protein S deficiency, lupus anticoagulant (PTT-LA) positive on warfarin PTA. Home regimen appears to be 4 mg daily except 4.5 mg MWF per recent outpatient cardiology visit on 08/01/22, however nursing home [facility] reflects regimen is 4.5 mg daily.   INR 1.8 today subtherapeutic. Hgb 12.2 stable, PLT 190 wnl  Last dose of warfarin prior to admission. Patient started on azithromycin and ceftriaxone for suspected CAP, transitioned to doxycycline 3/13 for 5 days total. Will closely watch INR given DDI.   3/15 AM update: INR 1.4- subtherapeutic PLT / Hgb wnl  Goal of Therapy:  INR 2-3 Monitor platelets by anticoagulation protocol: Yes   Plan:  Warfarin 5 mg x 1 Add lovenox 1 mg/kg [place] q12h until INR is > 2.0 Daily CBC, INR Monitor s/sx bleeding   Vaughan Basta BS, PharmD, BCPS Clinical Pharmacist 08/22/2022 9:14 AM  Contact: [phone] after 3 PM  "Be curious, not judgmental..." -Jamal Maes

## 2022-08-22 NOTE — Progress Notes (Signed)
Patient refusing bi pap at this time.

## 2022-08-22 NOTE — Care Management Important Message (Signed)
Important Message  Patient Details  Name: James Villegas MRN: [id number] Date of Birth: 1955-10-20   Medicare Important Message Given:  Yes     Orbie Pyo 08/22/2022, 1:33 PM

## 2022-08-22 NOTE — TOC Progression Note (Signed)
Transition of Care [facility]) - Progression Note    Patient Details  Name: James Villegas MRN: [id number] Date of Birth: 08-28-1955  [facility] of [facility]) CM/SW Contact  Tom-Johnson, Renea Ee, RN Phone Number: 08/22/2022, 5:08 PM  Clinical Narrative:     [facility] order called in to [employer], Erasmo Downer will f/u with weekend CM if any needs.        Expected Discharge Plan and Services                                               Social Determinants of Health (SDOH) Interventions SDOH Screenings   Tobacco Use: Medium Risk (08/01/2022)    Readmission Risk Interventions     No data to display

## 2022-08-22 NOTE — TOC Progression Note (Signed)
Transition of Care [facility]) - Initial/Assessment Note    Patient Details  Name: James Villegas MRN: [id number] Date of Birth: 1955/10/01  Transition of Care [facility]) CM/SW Contact:    Milinda Antis, Catharine Phone Number: 08/22/2022, 10:26 AM  Clinical Narrative:                 LCSW contacted [facility] and verified that the patient is long term care at the facility and can return when medically ready.  TOC following.     Patient Goals and CMS Choice            Expected Discharge Plan and Services                                              Prior Living Arrangements/Services                       Activities of Daily Living      Permission Sought/Granted                  Emotional Assessment              Admission diagnosis:  Acute respiratory failure with hypoxia and hypercarbia (HCC) [J96.01, J96.02] Acute on chronic respiratory failure with hypoxia and hypercapnia (HCC) [id number], J96.22] Patient Active Problem List   Diagnosis Date Noted   Acute respiratory failure with hypoxia and hypercarbia (HCC) 08/19/2022   History of cardioembolic cerebrovascular accident (CVA) 08/19/2022   Acute on chronic respiratory failure with hypoxia and hypercapnia (HCC) secondary to acute on chronic diastolic CHF exacerbation 09/16/2021   Hyponatremia 09/16/2021   History of DVT (deep vein thrombosis) 09/16/2021   GERD (gastroesophageal reflux disease) 09/16/2021   Aneurysm of ascending aorta (HCC)    Hypoxia    CHF exacerbation ([place]) 07/30/2020   Acute on chronic diastolic CHF (congestive heart failure) ([place]) 07/30/2020   Acute on chronic respiratory failure with hypercapnia ([place]) 07/30/2020   Pressure injury of skin 02/22/2020   AKI (acute kidney injury) ([place])    HCAP (healthcare-associated pneumonia)    Acute metabolic encephalopathy    Hypercoagulable state ([place])    Seizure disorder ([place])    Chronic diastolic CHF (congestive heart failure)  ([place])    Atrial fibrillation, chronic ([place]) on chronic anticoagulation  protein S and C deficiency    History of CVA with residual deficit    Hyperlipidemia    Acute respiratory failure ([place]) 02/20/2020   CAP (community acquired pneumonia) 02/20/2020   Obesity, Class III, BMI 40-49.9 (morbid obesity) ([place]) 02/20/2020   Bradycardia 02/20/2020   Hyperkalemia 02/20/2020   Hypercapnic respiratory failure ([place]) 02/20/2020   Acute on chronic congestive heart failure (HCC)    Permanent atrial fibrillation ([place])    Respiratory failure ([place]) 08/02/2018   Anemia of other chronic disease 11/23/2013   Pain in joint, ankle and foot 11/14/2013   Other convulsions 10/19/2013   Noninfectious gastroenteritis and colitis 07/29/2013   Left upper quadrant abdominal mass 07/28/2013   Other specified disease of white blood cells 07/16/2013   Ingrowing toenail with infection 05/20/2013   Late effects of cerebrovascular disease 10/15/2012   Pure hypercholesterolemia 10/15/2012   Essential hypertension, benign 10/15/2012   Hereditary and idiopathic peripheral neuropathy 10/15/2012   Cerebral infarction ([place]) 08/29/2011   DVT (deep venous thrombosis) ([place]) 08/29/2011   Bloody stool  08/29/2011   PFO (patent foramen ovale) 02/18/2011   PCP:  Raymondo Band, MD Pharmacy:   [facility] [place] [facility] [address] [address] [place] [address] Phone: [phone] Fax: [phone]     Social Determinants of Health (SDOH) Social History: SDOH Screenings   Tobacco Use: Medium Risk (08/01/2022)   SDOH Interventions:     Readmission Risk Interventions     No data to display

## 2022-08-22 NOTE — Evaluation (Signed)
Occupational Therapy Evaluation Patient Details Name: James Villegas MRN: [id number] DOB: 1956/03/30 Today's Date: 08/22/2022   History of Present Illness Patient is a 67 y.o. male presented to [facility] ED on 08/19/22 with c/o SOB and vomiting. While in ED, patient removed his oxygen and saturations dipped as low as 35%, at which point he became minimally responsive. Further workup suggestive of acute hypoxic and hypercapnic respiratory failure likely due to underlying OSA/OHS. He was admitted and treated with CPAP/BiPAP, after which he showed improvement. CXR showed patchy left lung opacities which may reflect pulmonary edema and increased left basilar linear opacity, favor atelectasis.  PMH: CVA with residual right-sided hemiplegia, dysphagia, lupus, CHF, afib, suspected OSA/OHS. He resides at a SNF.   Clinical Impression   Pt is functioning at his baseline. He requires set up for self feeding and grooming and is dependent in bathing, dressing and toileting at his SNF. He is lifted to his power w/c and propels it throughout the facility independently. Recommend return to [place].      Recommendations for follow up therapy are one component of a multi-disciplinary discharge planning process, led by the attending physician.  Recommendations may be updated based on patient status, additional functional criteria and insurance authorization.   Follow Up Recommendations  Long-term institutional care without follow-up therapy     Assistance Recommended at Discharge    Patient can return home with the following Two people to help with walking and/or transfers;Two people to help with bathing/dressing/bathroom;Assistance with cooking/housework;Assistance with feeding;Direct supervision/assist for medications management;Direct supervision/assist for financial management;Assist for transportation;Help with stairs or ramp for entrance    Functional Status Assessment  Patient has not had a recent decline in  their functional status  Equipment Recommendations  None recommended by OT    Recommendations for Other Services       Precautions / Restrictions Precautions Precautions: Fall Restrictions Weight Bearing Restrictions: No      Mobility Bed Mobility Overal bed mobility: Needs Assistance Bed Mobility: Rolling           General bed mobility comments: Turns right with rail with Modif I; max assist to turn left    Transfers                   General transfer comment: hoyer lift      Balance                                           ADL either performed or assessed with clinical judgement   ADL Overall ADL's : At baseline                                             Vision Ability to See in Adequate Light: 0 Adequate Patient Visual Report: No change from baseline       Perception     Praxis      Pertinent Vitals/Pain Pain Assessment Pain Assessment: No/denies pain     Hand Dominance Left   Extremity/Trunk Assessment Upper Extremity Assessment Upper Extremity Assessment: RUE deficits/detail RUE Deficits / Details: baseline hemiplegia with impaired sensation RUE Sensation: decreased light touch;decreased proprioception RUE Coordination: decreased fine motor;decreased gross motor   Lower Extremity Assessment Lower Extremity Assessment: Defer to PT evaluation  Cervical / Trunk Assessment Cervical / Trunk Assessment: Other exceptions (obesity)   Communication Communication Communication: Expressive difficulties   Cognition Arousal/Alertness: Awake/alert Behavior During Therapy: WFL for tasks assessed/performed Overall Cognitive Status: Within Functional Limits for tasks assessed                                       General Comments       Exercises     Shoulder Instructions      Home Living Family/patient expects to be discharged to:: Skilled nursing facility                                  Additional Comments: Resides at [facility] for the past 1.5 years.      Prior Functioning/Environment Prior Level of Function : Needs assist             Mobility Comments: hoyer lift to transfer to power w/c, able to mobilize with power w/c using LUE.  Pt sits up 11 am to 4 pm daily ADLs Comments: self feeds and grooms, assisted for bathing, dressing and toileting        OT Problem List: Impaired balance (sitting and/or standing);Impaired UE functional use;Decreased strength      OT Treatment/Interventions:      OT Goals(Current goals can be found in the care plan section) Acute Rehab OT Goals OT Goal Formulation: With patient  OT Frequency:      Co-evaluation              AM-PAC OT "6 Clicks" Daily Activity     Outcome Measure Help from another person eating meals?: A Little Help from another person taking care of personal grooming?: A Little Help from another person toileting, which includes using toliet, bedpan, or urinal?: Total Help from another person bathing (including washing, rinsing, drying)?: Total Help from another person to put on and taking off regular upper body clothing?: Total Help from another person to put on and taking off regular lower body clothing?: Total 6 Click Score: 10   End of Session    Activity Tolerance: Patient tolerated treatment well Patient left: in bed;with call bell/phone within reach;with bed alarm set;with nursing/sitter in room  OT Visit Diagnosis: Hemiplegia and hemiparesis Hemiplegia - Right/Left: Right Hemiplegia - dominant/non-dominant: Non-Dominant Hemiplegia - caused by: Cerebral infarction                Time: 1013-1026 OT Time Calculation (min): 13 min Charges:  OT General Charges $OT Visit: 1 Visit OT Evaluation $OT Eval Moderate Complexity: 1 Mod  Cleta Alberts, OTR/L Acute Rehabilitation Services Office: [phone]   Malka So 08/22/2022, 11:15 AM

## 2022-08-22 NOTE — Plan of Care (Signed)

## 2022-08-23 DIAGNOSIS — J9602 Acute respiratory failure with hypercapnia: Secondary | ICD-10-CM | POA: Diagnosis not present

## 2022-08-23 DIAGNOSIS — J9601 Acute respiratory failure with hypoxia: Secondary | ICD-10-CM | POA: Diagnosis not present

## 2022-08-23 LAB — CBC
HCT: 41.8 % (ref 39.0–52.0)
Hemoglobin: 13.2 g/dL (ref 13.0–17.0)
MCH: 30.3 pg (ref 26.0–34.0)
MCHC: 31.6 g/dL (ref 30.0–36.0)
MCV: 95.9 fL (ref 80.0–100.0)
Platelets: 224 10*3/uL (ref 150–400)
RBC: 4.36 MIL/uL (ref 4.22–5.81)
RDW: 13.3 % (ref 11.5–15.5)
WBC: 7.1 10*3/uL (ref 4.0–10.5)
nRBC: 0 % (ref 0.0–0.2)

## 2022-08-23 LAB — BASIC METABOLIC PANEL
Anion gap: 10 (ref 5–15)
BUN: 12 mg/dL (ref 8–23)
CO2: 39 mmol/L — ABNORMAL HIGH (ref 22–32)
Calcium: 8.7 mg/dL — ABNORMAL LOW (ref 8.9–10.3)
Chloride: 91 mmol/L — ABNORMAL LOW (ref 98–111)
Creatinine, Ser: 0.52 mg/dL — ABNORMAL LOW (ref 0.61–1.24)
GFR, Estimated: >60 mL/min (ref >60)
Glucose, Bld: 95 mg/dL (ref 70–99)
Potassium: 3.9 mmol/L (ref 3.5–5.1)
Sodium: 140 mmol/L (ref 135–145)

## 2022-08-23 LAB — MAGNESIUM: Magnesium: 2 mg/dL (ref 1.7–2.4)

## 2022-08-23 LAB — PROCALCITONIN: Procalcitonin: <0.05 ng/mL

## 2022-08-23 LAB — PROTIME-INR
INR: 1.6 — ABNORMAL HIGH (ref 0.8–1.2)
Prothrombin Time: 18.7 seconds — ABNORMAL HIGH (ref 11.4–15.2)

## 2022-08-23 LAB — BRAIN NATRIURETIC PEPTIDE: B Natriuretic Peptide: 128.9 pg/mL — ABNORMAL HIGH (ref 0.0–100.0)

## 2022-08-23 LAB — C-REACTIVE PROTEIN: CRP: 4.2 mg/dL — ABNORMAL HIGH (ref <1.0)

## 2022-08-23 MED ORDER — ENOXAPARIN SODIUM 150 MG/ML IJ SOSY
140.0000 mg | PREFILLED_SYRINGE | Freq: Two times a day (BID) | INTRAMUSCULAR | Status: DC
  Administered 2022-08-23 – 2022-08-24 (×4): 140 mg via SUBCUTANEOUS
  Filled 2022-08-23 (×5): qty 0.94

## 2022-08-23 MED ORDER — WARFARIN SODIUM 5 MG PO TABS
5.0000 mg | ORAL_TABLET | Freq: Once | ORAL | Status: AC
  Administered 2022-08-23: 5 mg via ORAL
  Filled 2022-08-23: qty 1

## 2022-08-23 NOTE — Plan of Care (Signed)
  Problem: Education: Goal: Knowledge of General Education information will improve Description: Including pain rating scale, medication(s)/side effects and non-pharmacologic comfort measures Outcome: Progressing   Problem: Clinical Measurements: Goal: Ability to maintain clinical measurements within normal limits will improve Outcome: Progressing Goal: Will remain free from infection Outcome: Progressing   

## 2022-08-23 NOTE — Progress Notes (Signed)
[place] for warfarin Indication:  Protein C and S def and + lupus anticoagulant, atrial fibrillation   Allergies  Allergen Reactions   Fenofibrate Other (See Comments)    Per MAR    Patient Measurements: Height: 6\' 1"  (185.4 cm) Weight: (!) 136.9 kg (301 lb 13 oz) IBW/kg (Calculated) : 79.9  Vital Signs: Temp: 97.5 F (36.4 C) (03/16 0817) Temp Source: Oral (03/16 0817) BP: 103/67 (03/16 0817) Pulse Rate: 99 (03/16 0817)  Labs: Recent Labs    08/21/22 0115 08/22/22 0807 08/23/22 0042  HGB 12.2* 13.5 13.2  HCT 38.0* 42.0 41.8  PLT 190 208 224  LABPROT 20.7* 17.0* 18.7*  INR 1.8* 1.4* 1.6*  CREATININE 0.72 0.59* 0.52*    Estimated Creatinine Clearance: 135.5 mL/min (A) (by C-G formula based on SCr of 0.52 mg/dL (L)).   Medical History: Past Medical History:  Diagnosis Date   Acute on chronic congestive heart failure (HCC)    Anemia, secondary    SECONDARY TO ACUTE BLOOD LOSS   Anxiety disorder    Bloody stool 08/29/2011   intermittent along with constipation.    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 IVC filter   Seizure disorder (HCC)     Assessment: 58 YOM with hx afib and protein C and protein S deficiency, lupus anticoagulant (PTT-LA) positive on warfarin PTA. Home regimen appears  to be 4 mg daily except 4.5 mg MWF per recent cardiology visit on 08/01/22, however nursing home [facility] reflects regimen is 4.5 mg daily.   INR 1.8 today subtherapeutic. Hgb 12.2 stable, PLT 190 wnl  Last dose of warfarin prior to admission. Patient started on azithromycin and ceftriaxone for suspected CAP, transitioned to doxycycline 3/13 for 5 days total. Will closely watch INR given DDI.   INR 1.6  subtherapeutic but trending up. PLT / Hgb wnl.  Goal of Therapy:  INR 2-3 Monitor platelets by anticoagulation protocol: Yes   Plan:  Give warfarin 5 mg PO x1 dose Enoxaparin 1 mg/kg SQ Q12h until INR is > 2.0  Check INR daily while on warfarin Continue to monitor H&H and platelets   Thank you for allowing pharmacy to be a part of this patient's care.  Ardyth Harps, PharmD Clinical Pharmacist

## 2022-08-23 NOTE — TOC Initial Note (Signed)
Transition of Care [facility]) - Initial/Assessment Note    Patient Details  Name: James Villegas MRN: [id number] Date of Birth: 02/20/1956  [facility] CM/SW Contact:    Maebelle Munroe, RN Phone Number: 08/23/2022, 10:09 AM  Clinical Narrative: Received call from [phone] to discuss patient's Trilogy order. She voices that patient has declined use of the Bipap in the past and expresses concern about the ise of the Trilogy. This Case Manager shares that she will meet with the patient to discuss. TOC team engaged patient at beside to discuss Trilogy. James Villegas is pleasant and engaged. Explained the importance of the him using the machine and the benefits for his health. He agrees to accept and use the Trilogy. He shares that he will be returning to [place] for his recovery. He requests that his sister be updated with this information.   Returned call to Erasmo Downer to update her on the patient's acceptance and to move forward with filling the order. She voices patient will need additional lung function tests before the order can be filled. Contacted Dr. Candiss Norse to call [employer] and discuss. After discuss Dr. Candiss Norse place order for additional testing. Followed up with Erasmo Downer at [place]. TOC team will continue to monitor for additional needs.                  Expected Discharge Plan: Long Term Nursing Home Barriers to Discharge: Continued Medical Work up   Patient Goals and CMS Choice Patient states their goals for this hospitalization and ongoing recovery are:: Patient states he plans to return to [place].          Expected Discharge Plan and Services In-house Referral: Clinical Social Work Discharge Planning Services: CM Consult   Living arrangements for the past 2 months: [place]                 DME Arranged: Bipap (Trilogy) DME Agency: [employer] Date DME Agency Contacted: 08/23/22 Time DME Agency Contacted: 0930 Representative  spoke with at DME Agency: Erasmo [phone]            Prior Living Arrangements/Services Living arrangements for the past 2 months: [place]                     Activities of Daily Living      Permission Sought/Granted Permission sought to share information with : Family Supports (Sister- Reymond Barrus: [phone]) Permission granted to share information with : Yes, Verbal Permission Granted           Permission granted to share info w Contact Information: Yates [id number]  Emotional Assessment   Attitude/Demeanor/Rapport: Engaged Affect (typically observed): Pleasant, Calm Orientation: : Oriented to Self, Oriented to Place, Oriented to  Time, Oriented to Situation   Psych Involvement: No (comment)  Admission diagnosis:  Acute respiratory failure with hypoxia and hypercarbia ([place]) [J96.01, J96.02] Acute on chronic respiratory failure with hypoxia and hypercapnia (HCC) [id number], J96.22] Patient Active Problem List   Diagnosis Date Noted   Acute respiratory failure with hypoxia and hypercarbia ([place]) 08/19/2022   History of cardioembolic cerebrovascular accident (CVA) 08/19/2022   Acute on chronic respiratory failure with hypoxia and hypercapnia (HCC) secondary to acute on chronic diastolic CHF exacerbation 09/16/2021   Hyponatremia 09/16/2021   History of DVT (deep vein thrombosis) 09/16/2021   GERD (gastroesophageal reflux disease) 09/16/2021   Aneurysm of ascending aorta (HCC)    Hypoxia    CHF exacerbation ([place])  07/30/2020   Acute on chronic diastolic CHF (congestive heart failure) ([place]) 07/30/2020   Acute on chronic respiratory failure with hypercapnia ([place]) 07/30/2020   Pressure injury of skin 02/22/2020   AKI (acute kidney injury) ([place])    HCAP (healthcare-associated pneumonia)    Acute metabolic encephalopathy    Hypercoagulable state ([place])    Seizure disorder ([place])    Chronic diastolic CHF (congestive heart failure) ([place])     Atrial fibrillation, chronic ([place]) on chronic anticoagulation  protein S and C deficiency    History of CVA with residual deficit    Hyperlipidemia    Acute respiratory failure ([place]) 02/20/2020   CAP (community acquired pneumonia) 02/20/2020   Obesity, Class III, BMI 40-49.9 (morbid obesity) ([place]) 02/20/2020   Bradycardia 02/20/2020   Hyperkalemia 02/20/2020   Hypercapnic respiratory failure ([place]) 02/20/2020   Acute on chronic congestive heart failure (HCC)    Permanent atrial fibrillation (HCC)    Respiratory failure ([place]) 08/02/2018   Anemia of other chronic disease 11/23/2013   Pain in joint, ankle and foot 11/14/2013   Other convulsions 10/19/2013   Noninfectious gastroenteritis and colitis 07/29/2013   Left upper quadrant abdominal mass 07/28/2013   Other specified disease of white blood cells 07/16/2013   Ingrowing toenail with infection 05/20/2013   Late effects of cerebrovascular disease 10/15/2012   Pure hypercholesterolemia 10/15/2012   Essential hypertension, benign 10/15/2012   Hereditary and idiopathic peripheral neuropathy 10/15/2012   Cerebral infarction ([place]) 08/29/2011   DVT (deep venous thrombosis) ([place]) 08/29/2011   Bloody stool 08/29/2011   PFO (patent foramen ovale) 02/18/2011   PCP:  Raymondo Band, MD Pharmacy:   [facility] [place] [facility] [address] [place] [address] Phone: [phone] Fax: [phone]     Social Determinants of Health (SDOH) Social History: [place]: Low Risk  (08/23/2022)  Tobacco Use: Medium Risk (08/01/2022)   SDOH Interventions: Housing Interventions: Intervention Not Indicated   Readmission Risk Interventions     No data to display

## 2022-08-23 NOTE — Progress Notes (Addendum)
PROGRESS NOTE                                                                                                                                                                                                             Patient Demographics:    James Villegas, is a 67 y.o. male, DOB - Apr 13, 1956, [id number]  Outpatient Primary MD for the patient is Raymondo Band, MD    LOS - 4  Admit date - 08/19/2022    Chief Complaint  Patient presents with   Shortness of Breath       Brief Narrative (HPI from H&P)   67 year old male with past medical history as below, which is significant for stroke with residual right-sided hemiplegia, dysphagia, lupus, CHF, atrial fibrillation, hypercoagulable disorder with protein C and protein S deficiencies. And suspected OSA/OHS, although patient had never received a formal sleep study despite multiple efforts. The patient resides in SNF from where [facility] on [facility]/12 with complaints of shortness of breath and vomiting. While in the emergency department the patient removed his oxygen and had oxygen saturations dipped to as low as 35% at which point he became minimally responsive.   His further workup was suggestive of acute hypoxic and hypercapnic respiratory failure likely due to underlying OSA OHS.  He was kept in the hospital treated with CPAP/BiPAP after which he showed improvement he was transitioned to my care on 08/21/2022 on day 2 of his hospital stay.   Subjective:   Patient in bed, appears comfortable, denies any headache, no fever, no chest pain or pressure, no shortness of breath , no abdominal pain. No new focal weakness, refused BiPAP.   Assessment  & Plan :   Acute on chronic hypoxic and hypercapnic respiratory failure causing severe metabolic encephalopathy in the setting of underlying undiagnosed OSA/OHS.  Worsened by fluid overload due to CHF.  Patient much  improved after wearing BiPAP, ABGs have showed good compensation and improvement, daytime oxygen and nighttime CPAP/BiPAP will be continued, outpatient sleep study.  Will like to see him qualify for noninvasive ventilatory device upon discharge to SNF.  He has been counseled on compliance.  He is agreeable.  Also he is remarkably better after diuresis on 08/21/2022, on 08/21/2022 despite not wearing BiPAP as it was not provided to him by the RT  patient is doing remarkably better on 08/22/2022 suggesting that there is a large component of his respiratory failure was due to fluid overload in conjunction with OSA/OHS.  Unfortunately night of 08/22/2022 in early morning 08/23/2022 patient refused wearing BiPAP at night multiple times despite counseling, updated patient's sister, will try and offer it again this evening counseled the patient again.  Morbid obesity with OSA OHS.  Plan as above.  PCP to follow for weight loss, BMI close to 40.  Acute on chronic diastolic CHF EF [id number] on recent echocardiogram.  Has evidence of fluid overload.  Placed on higher than home dose diuretics and potassium supplementation, diurese and monitor.  Improved with ongoing diuresis.  Chronic atrial fibrillation.  Mali vas 2 score of greater than 4.  Not on rate controlling agents, continue Coumadin per pharmacy.  History of hypercoagulable state with protein C&S deficiency.  Coumadin/Lovenox per pharmacy.  History of stroke with right-sided hemiplegia, dysphagia and dysarthria.  Speech to follow, PT OT, supportive care.  On Coumadin along with statin and Zetia for secondary prevention.  Dyslipidemia.  Statin and Zetia.  History of lupus.  Supportive care.  Seizure disorder.  Pharmacy monitoring Dilantin dose and levels.  They have been consulted by me on 08/21/2022 Hildred Alamin).  Lab Results  Component Value Date   INR 1.6 (H) 08/23/2022   INR 1.4 (H) 08/22/2022   INR 1.8 (H) 08/21/2022   PROTIME 36.0 (H) 12/27/2012   PROTIME  43.2 (H) 04/26/2012   PROTIME 32.4 (H) 12/29/2011        Condition -   Guarded  Family Communication  : Called Sister Manuela Schwartz [phone] on 08/21/2022, 08/22/22, 08/23/22  Code Status :  DNR  Consults  :  PCCM  PUD Prophylaxis : PPI   Procedures  :            Disposition Plan  :    Status is: Inpatient  DVT Prophylaxis  :    Place TED hose Start: 08/22/22 1011Coumadin  Lab Results  Component Value Date   INR 1.6 (H) 08/23/2022   INR 1.4 (H) 08/22/2022   INR 1.8 (H) 08/21/2022   PROTIME 36.0 (H) 12/27/2012   PROTIME 43.2 (H) 04/26/2012   PROTIME 32.4 (H) 12/29/2011    Lab Results  Component Value Date   PLT 224 08/23/2022    Diet :  Diet Order             Diet heart healthy/carb modified Room service appropriate? Yes; Fluid consistency: Thin; Fluid restriction: 1500 mL Fluid  Diet effective now                    Inpatient Medications  Scheduled Meds:  Chlorhexidine Gluconate Cloth  6 each Topical Daily   cycloSPORINE  1 drop Both Eyes BID   doxycycline  100 mg Oral Q12H   empagliflozin  10 mg Oral Daily   enoxaparin (LOVENOX) injection  1 mg/kg Subcutaneous Q12H   ezetimibe  10 mg Oral Daily   mouth rinse  15 mL Mouth Rinse 4 times per day   pantoprazole  40 mg Oral Daily   phenytoin  200 mg Oral BID   And   phenytoin  50 mg Oral QHS   polyvinyl alcohol  1 drop Both Eyes BID   potassium chloride SA  40 mEq Oral Daily   rosuvastatin  40 mg Oral QHS   spironolactone  25 mg Oral Daily   torsemide  40 mg Oral BID  venlafaxine XR  37.5 mg Oral QHS   Warfarin - Pharmacist Dosing Inpatient   Does not apply q1600   Continuous Infusions: PRN Meds:.docusate sodium, mouth rinse, polyethylene glycol    Objective:   Vitals:   08/22/22 1741 08/22/22 1940 08/23/22 0500 08/23/22 0817  BP: 114/64 122/68 109/65 103/67  Pulse: 99 99 66 99  Resp: 17 18 18  (!) 21  Temp: 98.1 F (36.7 C) 98 F (36.7 C) 97.7 F (36.5 C) (!) 97.5 F (36.4 C)   TempSrc: Oral Oral Oral Oral  SpO2: 98%  98% 97%  Weight:      Height:        Wt Readings from Last 3 Encounters:  08/21/22 (!) 136.9 kg  08/01/22 (!) 137 kg  01/23/22 (!) 137 kg     Intake/Output Summary (Last 24 hours) at 08/23/2022 0821 Last data filed at 08/23/2022 0457 Gross per 24 hour  Intake 840 ml  Output 4550 ml  Net -3710 ml     Physical Exam  Awake Alert, underlying right-sided weakness from previous stroke strength 1/5, underlying dysarthria from previous stroke, 1+ leg edema and left leg 2+ and right leg Merlin.AT,PERRAL Supple Neck, No JVD,   Symmetrical Chest wall movement, Good air movement bilaterally, few rales RRR,No Gallops,Rubs or new Murmurs,  +ve B.Sounds, Abd Soft, No tenderness,   1+ leg edema and left leg 2+ and right leg    RN pressure injury documentation: Pressure Injury 02/20/20 Buttocks Mid;Right;Left Stage 2 -  Partial thickness loss of dermis presenting as a shallow open injury with a red, pink wound bed without slough. (Active)  02/20/20 2100  Location: Buttocks  Location Orientation: Mid;Right;Left  Staging: Stage 2 -  Partial thickness loss of dermis presenting as a shallow open injury with a red, pink wound bed without slough.  Wound Description (Comments):   Present on Admission: Yes     Pressure Injury 09/16/21 Buttocks Right;Left Stage 1 -  Intact skin with non-blanchable redness of a localized area usually over a bony prominence. non-blanchable reddness (Active)  09/16/21 1155  Location: Buttocks  Location Orientation: Right;Left  Staging: Stage 1 -  Intact skin with non-blanchable redness of a localized area usually over a bony prominence.  Wound Description (Comments): non-blanchable reddness  Present on Admission: Yes      Data Review:    Recent Labs  Lab 08/19/22 1010 08/20/22 0134 08/20/22 0906 08/21/22 0115 08/22/22 0807 08/23/22 0042  WBC 8.8 6.9  --  6.2 7.3 7.1  HGB 11.4* 13.1 12.6* 12.2* 13.5 13.2  HCT  37.5* 41.6 37.0* 38.0* 42.0 41.8  PLT 182 185  --  190 208 224  MCV 101.9* 98.3  --  96.4 96.3 95.9  MCH 31.0 31.0  --  31.0 31.0 30.3  MCHC 30.4 31.5  --  32.1 32.1 31.6  RDW 13.7 13.4  --  13.8 13.4 13.3  LYMPHSABS 0.4*  --   --   --   --   --   MONOABS 0.8  --   --   --   --   --   EOSABS 0.0  --   --   --   --   --   BASOSABS 0.0  --   --   --   --   --     Recent Labs  Lab 08/19/22 1010 08/19/22 1034 08/19/22 1508 08/20/22 0134 08/20/22 [id number] 08/20/22 0906 08/21/22 0115 08/21/22 0929 08/22/22 0807 08/23/22 0042  NA 144  --   --  141  --  142 141  --  139 140  K 3.6  --   --  4.2  --  3.3* 3.2*  --  4.0 3.9  CL 102  --   --  95*  --   --  95*  --  94* 91*  CO2 32  --   --  33*  --   --  34*  --  35* 39*  ANIONGAP 10  --   --  13  --   --  12  --  10 10  GLUCOSE 133*  --   --  81  --   --  87  --  110* 95  BUN 8  --   --  14  --   --  15  --  13 12  CREATININE 0.48*  --   --  0.73  --   --  0.72  --  0.59* 0.52*  AST 11*  --   --   --   --   --  24  --   --   --   ALT 13  --   --   --   --   --  15  --   --   --   ALKPHOS 87  --   --   --   --   --  88  --   --   --   BILITOT 0.2*  --   --   --   --   --  0.4  --   --   --   ALBUMIN 2.5*  --   --   --   --   --  2.8*  --   --   --   CRP  --   --   --   --   --   --   --  10.1* 6.7* 4.2*  PROCALCITON  --   --  <0.10  --   --   --   --  <0.10 <0.10 <0.05  INR 2.2*  --   --   --  2.2*  --  1.8*  --  1.4* 1.6*  TSH  --   --   --   --   --   --   --  1.550  --   --   BNP  --  233.8* 219.0*  --   --   --   --  156.1* 185.9* 128.9*  MG  --   --   --  2.2  --   --  2.1  --  2.1 2.0  CALCIUM 7.3*  --   --  9.0  --   --  8.4*  --  8.9 8.7*     Recent Labs  Lab 08/19/22 1010 08/19/22 1034 08/19/22 1508 08/20/22 0134 08/20/22 0851 08/21/22 0115 08/21/22 0929 08/22/22 0807 08/23/22 0042  CRP  --   --   --   --   --   --  10.1* 6.7* 4.2*  PROCALCITON  --   --  <0.10  --   --   --  <0.10 <0.10 <0.05  INR 2.2*  --   --    --  2.2* 1.8*  --  1.4* 1.6*  TSH  --   --   --   --   --   --  1.550  --   --   BNP  --  233.8* 219.0*  --   --   --  156.1* 185.9* 128.9*  MG  --   --   --  2.2  --  2.1  --  2.1 2.0  CALCIUM 7.3*  --   --  9.0  --  8.4*  --  8.9 8.7*     Radiology Reports DG Chest Port 1 View  Result Date: 08/21/2022 CLINICAL DATA:  Shortness of breath EXAM: PORTABLE CHEST 1 VIEW COMPARISON:  Chest radiograph dated 08/19/2022 FINDINGS: Normal lung volumes. Similar asymmetric patchy left lung opacities. Increased left basilar linear opacity. Similar blunting of the bilateral costophrenic angles. No pneumothorax. Similar cardiomediastinal silhouette. The visualized skeletal structures are unremarkable. IMPRESSION: 1. Similar asymmetric patchy left lung opacities, which may reflect pulmonary edema. Increased left basilar linear opacity, favor atelectasis. 2. Similar blunting of the bilateral costophrenic angles, likely pleural effusion. Electronically Signed   By: Darrin Nipper M.D.   On: 08/21/2022 09:47   DG Chest Portable 1 View  Result Date: 08/19/2022 CLINICAL DATA:  Shortness of breath. EXAM: PORTABLE CHEST 1 VIEW COMPARISON:  Chest x-ray dated September 16, 2021. FINDINGS: Unchanged cardiomegaly, pulmonary vascular congestion, and mild diffuse interstitial thickening. Unchanged small bilateral pleural effusions. No pneumothorax. No acute osseous abnormality. IMPRESSION: 1. Unchanged mild congestive heart failure. Electronically Signed   By: Titus Dubin M.D.   On: 08/19/2022 11:00      Signature  -   Lala Lund M.D on 08/23/2022 at 8:21 AM   -  To page go to [email or web address]

## 2022-08-24 DIAGNOSIS — J9602 Acute respiratory failure with hypercapnia: Secondary | ICD-10-CM | POA: Diagnosis not present

## 2022-08-24 DIAGNOSIS — J9601 Acute respiratory failure with hypoxia: Secondary | ICD-10-CM | POA: Diagnosis not present

## 2022-08-24 LAB — CULTURE, BLOOD (ROUTINE X 2)
Culture: NO GROWTH
Culture: NO GROWTH
Special Requests: ADEQUATE
Special Requests: ADEQUATE

## 2022-08-24 LAB — CBC
HCT: 42 % (ref 39.0–52.0)
Hemoglobin: 13.9 g/dL (ref 13.0–17.0)
MCH: 31.2 pg (ref 26.0–34.0)
MCHC: 33.1 g/dL (ref 30.0–36.0)
MCV: 94.2 fL (ref 80.0–100.0)
Platelets: 219 10*3/uL (ref 150–400)
RBC: 4.46 MIL/uL (ref 4.22–5.81)
RDW: 13.2 % (ref 11.5–15.5)
WBC: 8.1 10*3/uL (ref 4.0–10.5)
nRBC: 0 % (ref 0.0–0.2)

## 2022-08-24 LAB — PROTIME-INR
INR: 1.8 — ABNORMAL HIGH (ref 0.8–1.2)
Prothrombin Time: 20.5 seconds — ABNORMAL HIGH (ref 11.4–15.2)

## 2022-08-24 MED ORDER — WARFARIN SODIUM 6 MG PO TABS
6.0000 mg | ORAL_TABLET | Freq: Once | ORAL | Status: AC
  Administered 2022-08-24: 6 mg via ORAL
  Filled 2022-08-24: qty 1

## 2022-08-24 NOTE — Progress Notes (Signed)
[place] for warfarin Indication:  Protein C and S def and + lupus anticoagulant, atrial fibrillation   Allergies  Allergen Reactions   Fenofibrate Other (See Comments)    Per [facility]    Patient Measurements: Height: 6\' 1"  (185.4 cm) Weight: 112.2 kg (247 lb 6.4 oz) IBW/kg (Calculated) : 79.9  Vital Signs: Temp: 97.7 F (36.5 C) (03/17 0431) Temp Source: Oral (03/17 0431) BP: 108/83 (03/17 0431) Pulse Rate: 96 (03/17 0431)  Labs: Recent Labs    08/22/22 0807 08/23/22 0042 08/24/22 0300  HGB 13.5 13.2 13.9  HCT 42.0 41.8 42.0  PLT 208 224 219  LABPROT 17.0* 18.7* 20.5*  INR 1.4* 1.6* 1.8*  CREATININE 0.59* 0.52*  --     Estimated Creatinine Clearance: 122.4 mL/min (A) (by C-G formula based on SCr of 0.52 mg/dL (L)).   Medical History: Past Medical History:  Diagnosis Date   Acute on chronic congestive heart failure (HCC)    Anemia, secondary    SECONDARY TO ACUTE BLOOD LOSS   Anxiety disorder    Bloody stool 08/29/2011   intermittent along with constipation.    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   Depression    DVT of lower extremity (deep venous thrombosis) ([place])    RIGHT LOWER; s/p IVC filter 7/12   Dysphagia    Hyperlipidemia    Hypertension    Lupus anticoagulant disorder ([place]) 1990   Morbid obesity (HCC)    PFO (patent foramen ovale)    TEE 2/10: EF 60%, trivial AI, mild Ao root dilatation, mod PFO with R-L shunting, atrial septal aneurysm;   echo 7/12: EF 65-70%, grade 1 diast dysfxn, mild MR, LVOT showed severe obstruction   Protein C deficiency (HCC)    Protein S deficiency (HCC)    Rectus sheath hematoma 7/12   required reversal of anticoagulation and c/b DVT req. IVC filter   Seizure disorder (HCC)     Assessment: 25 YOM with hx afib and protein C and protein S deficiency, lupus anticoagulant (PTT-LA) positive on warfarin PTA. Home regimen appears to  be 4 mg daily except 4.5 mg MWF per recent cardiology visit on 08/01/22, however nursing home [facility] reflects regimen is 4.5 mg daily.   Patient started on azithromycin and ceftriaxone for suspected CAP, transitioned to doxycycline 3/13 for 5 days total. Will closely watch INR given DDI.   INR 1.8  subtherapeutic but trending up. PLT / Hgb wnl.  Goal of Therapy:  INR 2-3 Monitor platelets by anticoagulation protocol: Yes   Plan:  Give warfarin 6 mg PO x1 dose Enoxaparin 1 mg/kg SQ Q12h until INR is > 2.0  Check INR daily while on warfarin Continue to monitor H&H and platelets   Thank you for allowing pharmacy to be a part of this patient's care.  Ardyth Harps, PharmD Clinical Pharmacist

## 2022-08-24 NOTE — Progress Notes (Signed)
PROGRESS NOTE                                                                                                                                                                                                             Patient Demographics:    James Villegas, is a 67 y.o. 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 And suspected OSA/OHS, although patient had never received a formal sleep study despite multiple efforts. The patient resides in SNF from where [facility] on [facility]/12 with complaints of shortness of breath and vomiting. While in the emergency department the patient removed his oxygen and had oxygen saturations dipped to as low as 35% at which point he became minimally responsive.   His further workup was suggestive of acute hypoxic and hypercapnic respiratory failure likely due to underlying OSA OHS.  He was kept in the hospital treated with CPAP/BiPAP after which he showed improvement he was transitioned to my care on 08/21/2022 on day 2 of his hospital stay.   Subjective:   Patient in bed, appears comfortable, denies any headache, no fever, no chest pain or pressure, no shortness of breath , no abdominal pain. No new focal weakness.   Assessment  & Plan :   Acute on chronic hypoxic and hypercapnic respiratory failure causing severe metabolic encephalopathy in the setting of underlying undiagnosed OSA/OHS.  Worsened by fluid overload due to CHF.  Patient much improved after  wearing BiPAP, ABGs have showed good compensation and improvement, daytime oxygen and nighttime CPAP/BiPAP will be continued, outpatient sleep study.  Will like to see him qualify for noninvasive ventilatory device upon discharge to SNF.  He has been counseled on compliance.  He is agreeable.  Also he is remarkably better after diuresis on 08/21/2022, on 08/21/2022 despite not wearing BiPAP as it was not provided to him by the RT patient is  doing remarkably better on 08/22/2022 suggesting that there is a large component of his respiratory failure was due to fluid overload in conjunction with OSA/OHS.  He did wear his provided BiPAP on the night of 08/23/2022 and felt better, says he will do his best to be compliant at SNF as well.  We are looking for Trelegy machine to be approved by insurance.  Eventually outpatient sleep study.  Bedside FEV1 will be done on 08/25/2022  Overnight Pox - Dates: 08/23/2022-08/24/2022  Length: 5hrs 78mins Desats: 41 (7.2/hr) SpO2 < 89%: 2H 51M (35.2%)   Pulse Rate                              SpO2 Range: 69-116                        76-100% Avg: 92.7                                 89.7% STD DEV: 4.6                         5          Morbid obesity with OSA OHS.  Plan as above.  PCP to follow for weight loss, BMI close to 40.  Acute on chronic diastolic CHF EF [id number] on recent echocardiogram.  Has evidence of fluid overload.  Placed on higher than home dose diuretics and potassium supplementation, diurese and monitor.  Improved with ongoing diuresis.  Chronic atrial fibrillation.  Mali vas 2 score of greater than 4.  Not on rate controlling agents, continue Coumadin per pharmacy.  History of hypercoagulable state with protein C&S deficiency.  Coumadin/Lovenox per pharmacy.  History of stroke with right-sided hemiplegia, dysphagia and dysarthria.  Speech to follow, PT OT, supportive care.  On Coumadin along with statin and Zetia for secondary prevention.  Dyslipidemia.   Statin and Zetia.  History of lupus.  Supportive care.  Seizure disorder.  Pharmacy monitoring Dilantin dose and levels.  They have been consulted by me on 08/21/2022 Hildred Alamin).  Lab Results  Component Value Date   INR 1.8 (H) 08/24/2022   INR 1.6 (H) 08/23/2022   INR 1.4 (H) 08/22/2022   PROTIME 36.0 (H) 12/27/2012   PROTIME 43.2 (H) 04/26/2012   PROTIME 32.4 (H) 12/29/2011        Condition -   Guarded  Family Communication  : Called Sister Manuela Schwartz [phone] on 08/21/2022, 08/22/22, 08/23/22  Code Status :  DNR  Consults  :  PCCM  PUD Prophylaxis : PPI   Procedures  :            Disposition Plan  :    Status is: Inpatient  DVT Prophylaxis  :    Place TED hose Start: 08/22/22 1011Coumadin  Lab Results  Component Value Date   INR 1.8 (H) 08/24/2022   INR 1.6 (H) 08/23/2022   INR 1.4 (H) 08/22/2022   PROTIME 36.0 (H) 12/27/2012   PROTIME 43.2 (H) 04/26/2012   PROTIME 32.4 (H) 12/29/2011    Lab Results  Component Value Date   PLT 219 08/24/2022    Diet :  Diet Order             Diet heart healthy/carb modified Room service appropriate? Yes; Fluid consistency: Thin; Fluid restriction: 1500  mL Fluid  Diet effective now                    Inpatient Medications  Scheduled Meds:  Chlorhexidine Gluconate Cloth  6 each Topical Daily   cycloSPORINE  1 drop Both Eyes BID   empagliflozin  10 mg Oral Daily   enoxaparin (LOVENOX) injection  140 mg Subcutaneous Q12H   ezetimibe  10 mg Oral Daily   mouth rinse  15 mL Mouth Rinse 4 times per day   pantoprazole  40 mg Oral Daily   phenytoin  200 mg Oral BID   And   phenytoin  50 mg Oral QHS   polyvinyl alcohol  1 drop Both Eyes BID   potassium chloride SA  40 mEq Oral Daily   rosuvastatin  40 mg Oral QHS   spironolactone  25 mg Oral Daily   torsemide  40 mg Oral BID   venlafaxine XR  37.5 mg Oral QHS   warfarin  6 mg Oral ONCE-1600   Warfarin - Pharmacist Dosing Inpatient   Does not apply q1600    Continuous Infusions: PRN Meds:.docusate sodium, mouth rinse, polyethylene glycol    Objective:   Vitals:   08/23/22 2230 08/24/22 0431 08/24/22 0720 08/24/22 0917  BP:  108/83  121/72  Pulse: 90 96  (!) 102  Resp: 20 16    Temp:  97.7 F (36.5 C)    TempSrc:  Oral    SpO2: 97% 99%  96%  Weight:   129.2 kg   Height:        Wt Readings from Last 3 Encounters:  08/24/22 129.2 kg  08/01/22 (!) 137 kg  01/23/22 (!) 137 kg     Intake/Output Summary (Last 24 hours) at 08/24/2022 0939 Last data filed at 08/24/2022 0618 Gross per 24 hour  Intake 1210 ml  Output 4300 ml  Net -3090 ml     Physical Exam  Awake Alert, underlying right-sided weakness from previous stroke strength 1/5, underlying dysarthria from previous stroke, 1+ leg edema and left leg 2+ and right leg [place].AT,PERRAL Supple Neck, No JVD,   Symmetrical Chest wall movement, Good air movement bilaterally, few rales RRR,No Gallops,Rubs or new Murmurs,  +ve B.Sounds, Abd Soft, No tenderness,   1+ leg edema and left leg 2+ and right leg    RN pressure injury documentation: Pressure Injury 02/20/20 Buttocks Mid;Right;Left Stage 2 -  Partial thickness loss of dermis presenting as a shallow open injury with a red, pink wound bed without slough. (Active)  02/20/20 2100  Location: Buttocks  Location Orientation: Mid;Right;Left  Staging: Stage 2 -  Partial thickness loss of dermis presenting as a shallow open injury with a red, pink wound bed without slough.  Wound Description (Comments):   Present on Admission: Yes     Pressure Injury 09/16/21 Buttocks Right;Left Stage 1 -  Intact skin with non-blanchable redness of a localized area usually over a bony prominence. non-blanchable reddness (Active)  09/16/21 1155  Location: Buttocks  Location Orientation: Right;Left  Staging: Stage 1 -  Intact skin with non-blanchable redness of a localized area usually over a bony prominence.  Wound Description (Comments):  non-blanchable reddness  Present on Admission: Yes      Data Review:    Recent Labs  Lab 08/19/22 1010 08/20/22 0134 08/20/22 0906 08/21/22 0115 08/22/22 0807 08/23/22 0042 08/24/22 0300  WBC 8.8 6.9  --  6.2 7.3 7.1 8.1  HGB 11.4* 13.1 12.6* 12.2* 13.5 13.2  13.9  HCT 37.5* 41.6 37.0* 38.0* 42.0 41.8 42.0  PLT 182 185  --  190 208 224 219  MCV 101.9* 98.3  --  96.4 96.3 95.9 94.2  MCH 31.0 31.0  --  31.0 31.0 30.3 31.2  MCHC 30.4 31.5  --  32.1 32.1 31.6 33.1  RDW 13.7 13.4  --  13.8 13.4 13.3 13.2  LYMPHSABS 0.4*  --   --   --   --   --   --   MONOABS 0.8  --   --   --   --   --   --   EOSABS 0.0  --   --   --   --   --   --   BASOSABS 0.0  --   --   --   --   --   --     Recent Labs  Lab 08/19/22 1010 08/19/22 1034 08/19/22 1508 08/20/22 0134 08/20/22 0851 08/20/22 0906 08/21/22 0115 08/21/22 0929 08/22/22 0807 08/23/22 0042 08/24/22 0300  NA 144  --   --  141  --  142 141  --  139 140  --   K 3.6  --   --  4.2  --  3.3* 3.2*  --  4.0 3.9  --   CL 102  --   --  95*  --   --  95*  --  94* 91*  --   CO2 32  --   --  33*  --   --  34*  --  35* 39*  --   ANIONGAP 10  --   --  13  --   --  12  --  10 10  --   GLUCOSE 133*  --   --  81  --   --  87  --  110* 95  --   BUN 8  --   --  14  --   --  15  --  13 12  --   CREATININE 0.48*  --   --  0.73  --   --  0.72  --  0.59* 0.52*  --   AST 11*  --   --   --   --   --  24  --   --   --   --   ALT 13  --   --   --   --   --  15  --   --   --   --   ALKPHOS 87  --   --   --   --   --  88  --   --   --   --   BILITOT 0.2*  --   --   --   --   --  0.4  --   --   --   --   ALBUMIN 2.5*  --   --   --   --   --  2.8*  --   --   --   --   CRP  --   --   --   --   --   --   --  10.1* 6.7* 4.2*  --   PROCALCITON  --   --  <0.10  --   --   --   --  <0.10 <0.10 <0.05  --   INR 2.2*  --   --   --  2.2*  --  1.8*  --  1.4* 1.6* 1.8*  TSH  --   --   --   --   --   --   --  1.550  --   --   --   BNP  --  233.8* 219.0*  --   --    --   --  156.1* 185.9* 128.9*  --   MG  --   --   --  2.2  --   --  2.1  --  2.1 2.0  --   CALCIUM 7.3*  --   --  9.0  --   --  8.4*  --  8.9 8.7*  --      Recent Labs  Lab 08/19/22 1010 08/19/22 1034 08/19/22 1508 08/20/22 0134 08/20/22 0851 08/21/22 0115 08/21/22 0929 08/22/22 0807 08/23/22 0042 08/24/22 0300  CRP  --   --   --   --   --   --  10.1* 6.7* 4.2*  --   PROCALCITON  --   --  <0.10  --   --   --  <0.10 <0.10 <0.05  --   INR 2.2*  --   --   --  2.2* 1.8*  --  1.4* 1.6* 1.8*  TSH  --   --   --   --   --   --  1.550  --   --   --   BNP  --  233.8* 219.0*  --   --   --  156.1* 185.9* 128.9*  --   MG  --   --   --  2.2  --  2.1  --  2.1 2.0  --   CALCIUM 7.3*  --   --  9.0  --  8.4*  --  8.9 8.7*  --      Radiology Reports DG Chest Port 1 View  Result Date: 08/21/2022 CLINICAL DATA:  Shortness of breath EXAM: PORTABLE CHEST 1 VIEW COMPARISON:  Chest radiograph dated 08/19/2022 FINDINGS: Normal lung volumes. Similar asymmetric patchy left lung opacities. Increased left basilar linear opacity. Similar blunting of the bilateral costophrenic angles. No pneumothorax. Similar cardiomediastinal silhouette. The visualized skeletal structures are unremarkable. IMPRESSION: 1. Similar asymmetric patchy left lung opacities, which may reflect pulmonary edema. Increased left basilar linear opacity, favor atelectasis. 2. Similar blunting of the bilateral costophrenic angles, likely pleural effusion. Electronically Signed   By: Darrin Nipper M.D.   On: 08/21/2022 09:47      Signature  -   Lala Lund M.D on 08/24/2022 at 9:39 AM   -  To page go to [email or web address]

## 2022-08-24 NOTE — Progress Notes (Signed)
      Pt ID: [id number] Dates: 08/23/2022-08/24/2022 Length: 5hrs 9mins Desats: 41 (7.2/hr) SpO2 < 89%: 2H 33M (35.2%)  Pulse Rate   SpO2 Range: 69-116   76-100% Avg: 92.7   89.7% STD DEV: 4.6   5

## 2022-08-25 ENCOUNTER — Inpatient Hospital Stay (HOSPITAL_COMMUNITY): Payer: Medicare Other

## 2022-08-25 DIAGNOSIS — J9601 Acute respiratory failure with hypoxia: Secondary | ICD-10-CM | POA: Diagnosis not present

## 2022-08-25 DIAGNOSIS — J9602 Acute respiratory failure with hypercapnia: Secondary | ICD-10-CM | POA: Diagnosis not present

## 2022-08-25 LAB — CBC WITH DIFFERENTIAL/PLATELET
Abs Immature Granulocytes: 0.02 10*3/uL (ref 0.00–0.07)
Basophils Absolute: 0.1 10*3/uL (ref 0.0–0.1)
Basophils Relative: 1 %
Eosinophils Absolute: 0.6 10*3/uL — ABNORMAL HIGH (ref 0.0–0.5)
Eosinophils Relative: 9 %
HCT: 43.2 % (ref 39.0–52.0)
Hemoglobin: 14 g/dL (ref 13.0–17.0)
Immature Granulocytes: 0 %
Lymphocytes Relative: 14 %
Lymphs Abs: 0.9 10*3/uL (ref 0.7–4.0)
MCH: 30.5 pg (ref 26.0–34.0)
MCHC: 32.4 g/dL (ref 30.0–36.0)
MCV: 94.1 fL (ref 80.0–100.0)
Monocytes Absolute: 0.6 10*3/uL (ref 0.1–1.0)
Monocytes Relative: 9 %
Neutro Abs: 4.3 10*3/uL (ref 1.7–7.7)
Neutrophils Relative %: 67 %
Platelets: 201 10*3/uL (ref 150–400)
RBC: 4.59 MIL/uL (ref 4.22–5.81)
RDW: 13.2 % (ref 11.5–15.5)
WBC: 6.6 10*3/uL (ref 4.0–10.5)
nRBC: 0 % (ref 0.0–0.2)

## 2022-08-25 LAB — PULMONARY FUNCTION TEST
FEF 25-75 Pre: 0.33 L/sec
FEF2575-%Pred-Pre: 10 %
FEV1-%Pred-Pre: 31 %
FEV1-Pre: 1.21 L
FEV1FVC-%Pred-Pre: 70 %
FEV6-%Pred-Pre: 41 %
FEV6-Pre: 2.03 L
FEV6FVC-%Pred-Pre: 92 %
FVC-%Pred-Pre: 44 %
FVC-Pre: 2.29 L
Pre FEV1/FVC ratio: 53 %
Pre FEV6/FVC Ratio: 88 %

## 2022-08-25 LAB — PROTIME-INR
INR: 1.9 — ABNORMAL HIGH (ref 0.8–1.2)
Prothrombin Time: 21.8 seconds — ABNORMAL HIGH (ref 11.4–15.2)

## 2022-08-25 LAB — BASIC METABOLIC PANEL
Anion gap: 9 (ref 5–15)
BUN: 17 mg/dL (ref 8–23)
CO2: 36 mmol/L — ABNORMAL HIGH (ref 22–32)
Calcium: 8.7 mg/dL — ABNORMAL LOW (ref 8.9–10.3)
Chloride: 94 mmol/L — ABNORMAL LOW (ref 98–111)
Creatinine, Ser: 0.76 mg/dL (ref 0.61–1.24)
GFR, Estimated: >60 mL/min (ref >60)
Glucose, Bld: 86 mg/dL (ref 70–99)
Potassium: 3.9 mmol/L (ref 3.5–5.1)
Sodium: 139 mmol/L (ref 135–145)

## 2022-08-25 LAB — MAGNESIUM: Magnesium: 2.2 mg/dL (ref 1.7–2.4)

## 2022-08-25 LAB — BRAIN NATRIURETIC PEPTIDE: B Natriuretic Peptide: 50.2 pg/mL (ref 0.0–100.0)

## 2022-08-25 MED ORDER — ENOXAPARIN SODIUM 150 MG/ML IJ SOSY
130.0000 mg | PREFILLED_SYRINGE | Freq: Two times a day (BID) | INTRAMUSCULAR | Status: DC
  Administered 2022-08-25 (×2): 130 mg via SUBCUTANEOUS
  Filled 2022-08-25 (×3): qty 0.86

## 2022-08-25 MED ORDER — WARFARIN SODIUM 4 MG PO TABS
4.5000 mg | ORAL_TABLET | Freq: Once | ORAL | Status: AC
  Administered 2022-08-25: 4.5 mg via ORAL
  Filled 2022-08-25: qty 1

## 2022-08-25 NOTE — Progress Notes (Signed)
Respiratory Care Note Bedside PFT done today  FEV1 31%.  PFT Report is in patient chart

## 2022-08-25 NOTE — Plan of Care (Signed)
  Problem: Nutrition: Goal: Adequate nutrition will be maintained Outcome: Progressing   Problem: Coping: Goal: Level of anxiety will decrease Outcome: Progressing   Problem: Elimination: Goal: Will not experience complications related to bowel motility Outcome: Progressing Goal: Will not experience complications related to urinary retention Outcome: Progressing   

## 2022-08-25 NOTE — TOC Progression Note (Signed)
Transition of Care [facility]) - Initial/Assessment Note    Patient Details  Name: James Villegas MRN: [id number] Date of Birth: 30-Mar-1956  [facility]) CM/SW Contact:    Milinda Antis, LCSWA Phone Number: 08/25/2022, 11:03 AM  Clinical Narrative:                 LCSW informed the SNF [facility]) of possible d/c tomorrow.    TOC following.  Expected Discharge Plan: Long Term Nursing Home Barriers to Discharge: Continued Medical Work up   Patient Goals and CMS Choice Patient states their goals for this hospitalization and ongoing recovery are:: Patient states he plans to return to [place].          Expected Discharge Plan and Services In-house Referral: Clinical Social Work Discharge Planning Services: CM Consult   Living arrangements for the past 2 months: [place]                 DME Arranged: [employer] ([employer]) DME Agency: [employer] Date DME Agency Contacted: 08/23/22 Time DME Agency Contacted: 0930 Representative spoke with at DME Agency: Erasmo [phone]            Prior Living Arrangements/Services Living arrangements for the past 2 months: [place]                     Activities of Daily Living      Permission Sought/Granted Permission sought to share information with : Family Supports (Sister- Josiya June: [phone]) Permission granted to share information with : Yes, Verbal Permission Granted           Permission granted to share info w Contact Information: Altan [id number]  Emotional Assessment   Attitude/Demeanor/Rapport: Engaged Affect (typically observed): Pleasant, Calm Orientation: : Oriented to Self, Oriented to Place, Oriented to  Time, Oriented to Situation   Psych Involvement: No (comment)  Admission diagnosis:  Acute respiratory failure with hypoxia and hypercarbia (HCC) [J96.01, J96.02] Acute on chronic respiratory failure with hypoxia and hypercapnia (HCC)  [id number], J96.22] Patient Active Problem List   Diagnosis Date Noted   Acute respiratory failure with hypoxia and hypercarbia ([place]) 08/19/2022   History of cardioembolic cerebrovascular accident (CVA) 08/19/2022   Acute on chronic respiratory failure with hypoxia and hypercapnia (HCC) secondary to acute on chronic diastolic CHF exacerbation 09/16/2021   Hyponatremia 09/16/2021   History of DVT (deep vein thrombosis) 09/16/2021   GERD (gastroesophageal reflux disease) 09/16/2021   Aneurysm of ascending aorta (HCC)    Hypoxia    CHF exacerbation ([place]) 07/30/2020   Acute on chronic diastolic CHF (congestive heart failure) ([place]) 07/30/2020   Acute on chronic respiratory failure with hypercapnia ([place]) 07/30/2020   Pressure injury of skin 02/22/2020   AKI (acute kidney injury) ([place])    HCAP (healthcare-associated pneumonia)    Acute metabolic encephalopathy    Hypercoagulable state ([place])    Seizure disorder (HCC)    Chronic diastolic CHF (congestive heart failure) ([place])    Atrial fibrillation, chronic ([place]) on chronic anticoagulation  protein S and C deficiency    History of CVA with residual deficit    Hyperlipidemia    Acute respiratory failure ([place]) 02/20/2020   CAP (community acquired pneumonia) 02/20/2020   Obesity, Class III, BMI 40-49.9 (morbid obesity) ([place]) 02/20/2020   Bradycardia 02/20/2020   Hyperkalemia 02/20/2020   Hypercapnic respiratory failure ([place]) 02/20/2020   Acute on chronic congestive heart failure (HCC)    Permanent atrial fibrillation ([place])  Respiratory failure ([place]) 08/02/2018   Anemia of other chronic disease 11/23/2013   Pain in joint, ankle and foot 11/14/2013   Other convulsions 10/19/2013   Noninfectious gastroenteritis and colitis 07/29/2013   Left upper quadrant abdominal mass 07/28/2013   Other specified disease of white blood cells 07/16/2013   Ingrowing toenail with infection 05/20/2013   Late effects of cerebrovascular disease 10/15/2012   Pure  hypercholesterolemia 10/15/2012   Essential hypertension, benign 10/15/2012   Hereditary and idiopathic peripheral neuropathy 10/15/2012   Cerebral infarction ([place]) 08/29/2011   DVT (deep venous thrombosis) ([place]) 08/29/2011   Bloody stool 08/29/2011   PFO (patent foramen ovale) 02/18/2011   PCP:  Raymondo Band, MD Pharmacy:   [facility] [place] [facility] [place] [facility] [address] [place] [address] Phone: [phone] Fax: [phone]     Social Determinants of Health (SDOH) Social History: [place]: Low Risk  (08/23/2022)  Tobacco Use: Medium Risk (08/01/2022)   SDOH Interventions: Housing Interventions: Intervention Not Indicated   Readmission Risk Interventions     No data to display

## 2022-08-25 NOTE — Progress Notes (Signed)
PROGRESS NOTE                                                                                                                                                                                                             Patient Demographics:    James Villegas, is a 67 y.o. male, DOB - [birth year], QP:[id number]  Outpatient Primary MD for the patient is Raymondo Band, MD    LOS - 6  Admit date - 08/19/2022    Chief Complaint  Patient presents with   Shortness of Breath       Brief Narrative (HPI from H&P)   67 year old male with past medical history as below, which is significant for stroke with residual right-sided hemiplegia, dysphagia, lupus, CHF, atrial fibrillation, hypercoagulable disorder with protein C and protein S deficiencies. And suspected OSA/OHS, although patient had never received a formal sleep study despite multiple efforts. The patient resides in SNF from where he [facility] ED on 3/12 with complaints of shortness of breath and vomiting. While in the emergency department the patient removed his oxygen and had oxygen saturations dipped to as low as 35% at which point he became minimally responsive.   His further workup was suggestive of acute hypoxic and hypercapnic respiratory failure likely due to underlying OSA OHS.  He was kept in the hospital treated with CPAP/BiPAP after which he showed improvement he was transitioned to my care on 08/21/2022 on day 2 of his hospital stay.   Subjective:   Patient in bed, appears comfortable, denies any headache, no fever, no chest pain or pressure, no shortness of breath , no abdominal pain. No focal weakness.   Assessment  & Plan :   Acute on chronic hypoxic and hypercapnic respiratory failure causing severe metabolic encephalopathy in the setting of underlying undiagnosed OSA/OHS.  Worsened by fluid overload due to CHF.  Patient much improved after wearing  BiPAP, ABGs have showed good compensation and improvement, daytime oxygen and nighttime CPAP/BiPAP will be continued, outpatient sleep study.  Will like to see him qualify for noninvasive ventilatory device upon discharge to SNF.  He has been counseled on compliance.  He is agreeable.  Also he is remarkably better after diuresis on 08/21/2022, on 08/21/2022 despite not wearing BiPAP as it was not provided to him by the RT patient is doing  remarkably better on 08/22/2022 suggesting that there is a large component of his respiratory failure was due to fluid overload in conjunction with OSA/OHS.  He did wear his provided BiPAP on the night of 08/23/2022 and felt better, says he will do his best to be compliant at SNF as well.  We are looking for Trelegy machine to be approved by insurance.  Eventually outpatient sleep study.  Bedside FEV1 will be done on 08/25/2022, discussed with RT on 08/25/2022.  Overnight Pox - Dates: 08/23/2022-08/24/2022  Length: 5hrs 31mins Desats: 41 (7.2/hr) SpO2 < 89%: 2H 31M (35.2%)   Pulse Rate                              SpO2 Range: 69-116                        76-100% Avg: 92.7                                 89.7% STD DEV: 4.6                         5          Morbid obesity with OSA OHS.  Plan as above.  PCP to follow for weight loss, BMI close to 40.  Acute on chronic diastolic CHF EF [id number] on recent echocardiogram.  Has evidence of fluid overload.  Placed on higher than home dose diuretics and potassium supplementation, diurese and monitor.  Improved with ongoing diuresis.  Chronic atrial fibrillation.  Mali vas 2 score of greater than 4.  Not on rate controlling agents, continue Coumadin per pharmacy.  History of hypercoagulable state with protein C&S deficiency.  Coumadin/Lovenox per pharmacy.  History of stroke with right-sided hemiplegia, dysphagia and dysarthria.  Speech to follow, PT OT, supportive care.  On Coumadin along with statin and Zetia for secondary  prevention.  Dyslipidemia.  Statin and Zetia.  History of lupus.  Supportive care.  Seizure disorder.  Pharmacy monitoring Dilantin dose and levels.  They have been consulted by me on 08/21/2022 Hildred Alamin).  Lab Results  Component Value Date   INR 1.9 (H) 08/25/2022   INR 1.8 (H) 08/24/2022   INR 1.6 (H) 08/23/2022   PROTIME 36.0 (H) 12/27/2012   PROTIME 43.2 (H) 04/26/2012   PROTIME 32.4 (H) 12/29/2011        Condition -   Guarded  Family Communication  : Called Sister Manuela Schwartz [phone] on 08/21/2022, 08/22/22, 08/23/22  Code Status :  DNR  Consults  :  PCCM  PUD Prophylaxis : PPI   Procedures  :            Disposition Plan  :    Status is: Inpatient  DVT Prophylaxis  :    Place TED hose Start: 08/22/22 1011Coumadin  Lab Results  Component Value Date   INR 1.9 (H) 08/25/2022   INR 1.8 (H) 08/24/2022   INR 1.6 (H) 08/23/2022   PROTIME 36.0 (H) 12/27/2012   PROTIME 43.2 (H) 04/26/2012   PROTIME 32.4 (H) 12/29/2011    Lab Results  Component Value Date   PLT 201 08/25/2022    Diet :  Diet Order             Diet heart healthy/carb modified Room service appropriate? Yes; Fluid consistency:  Thin; Fluid restriction: 1500 mL Fluid  Diet effective now                    Inpatient Medications  Scheduled Meds:  Chlorhexidine Gluconate Cloth  6 each Topical Daily   cycloSPORINE  1 drop Both Eyes BID   empagliflozin  10 mg Oral Daily   enoxaparin (LOVENOX) injection  130 mg Subcutaneous Q12H   ezetimibe  10 mg Oral Daily   mouth rinse  15 mL Mouth Rinse 4 times per day   pantoprazole  40 mg Oral Daily   phenytoin  200 mg Oral BID   And   phenytoin  50 mg Oral QHS   polyvinyl alcohol  1 drop Both Eyes BID   potassium chloride SA  40 mEq Oral Daily   rosuvastatin  40 mg Oral QHS   spironolactone  25 mg Oral Daily   torsemide  40 mg Oral BID   venlafaxine XR  37.5 mg Oral QHS   warfarin  4.5 mg Oral ONCE-1600   Warfarin - Pharmacist Dosing  Inpatient   Does not apply q1600   Continuous Infusions: PRN Meds:.docusate sodium, mouth rinse, polyethylene glycol    Objective:   Vitals:   08/24/22 2124 08/25/22 0508 08/25/22 0842 08/25/22 1000  BP: 101/66 105/75 102/65   Pulse: 91 (!) 104 (!) 104   Resp: 18 17    Temp: 98.1 F (36.7 C) 98.3 F (36.8 C) 97.9 F (36.6 C)   TempSrc: Oral  Oral   SpO2: 97% 99% 99%   Weight:    113.3 kg  Height:        Wt Readings from Last 3 Encounters:  08/25/22 113.3 kg  08/01/22 (!) 137 kg  01/23/22 (!) 137 kg     Intake/Output Summary (Last 24 hours) at 08/25/2022 1016 Last data filed at 08/25/2022 [id number] Gross per 24 hour  Intake 720 ml  Output 3025 ml  Net -2305 ml     Physical Exam  Awake Alert, underlying right-sided weakness from previous stroke strength 1/5, underlying dysarthria from previous stroke, 1+ leg edema and left leg 2+ and right leg [place].AT,PERRAL Supple Neck, No JVD,   Symmetrical Chest wall movement, Good air movement bilaterally, few rales RRR,No Gallops,Rubs or new Murmurs,  +ve B.Sounds, Abd Soft, No tenderness,   1+ leg edema and left leg 2+ and right leg    RN pressure injury documentation: Pressure Injury 02/20/20 Buttocks Mid;Right;Left Stage 2 -  Partial thickness loss of dermis presenting as a shallow open injury with a red, pink wound bed without slough. (Active)  02/20/20 2100  Location: Buttocks  Location Orientation: Mid;Right;Left  Staging: Stage 2 -  Partial thickness loss of dermis presenting as a shallow open injury with a red, pink wound bed without slough.  Wound Description (Comments):   Present on Admission: Yes     Pressure Injury 09/16/21 Buttocks Right;Left Stage 1 -  Intact skin with non-blanchable redness of a localized area usually over a bony prominence. non-blanchable reddness (Active)  09/16/21 1155  Location: Buttocks  Location Orientation: Right;Left  Staging: Stage 1 -  Intact skin with non-blanchable redness of a  localized area usually over a bony prominence.  Wound Description (Comments): non-blanchable reddness  Present on Admission: Yes      Data Review:    Recent Labs  Lab 08/19/22 1010 08/20/22 0134 08/21/22 0115 08/22/22 0807 08/23/22 0042 08/24/22 0300 08/25/22 0439  WBC 8.8   < > 6.2  7.3 7.1 8.1 6.6  HGB 11.4*   < > 12.2* 13.5 13.2 13.9 14.0  HCT 37.5*   < > 38.0* 42.0 41.8 42.0 43.2  PLT 182   < > 190 208 224 219 201  MCV 101.9*   < > 96.4 96.3 95.9 94.2 94.1  MCH 31.0   < > 31.0 31.0 30.3 31.2 30.5  MCHC 30.4   < > 32.1 32.1 31.6 33.1 32.4  RDW 13.7   < > 13.8 13.4 13.3 13.2 13.2  LYMPHSABS 0.4*  --   --   --   --   --  0.9  MONOABS 0.8  --   --   --   --   --  0.6  EOSABS 0.0  --   --   --   --   --  0.6*  BASOSABS 0.0  --   --   --   --   --  0.1   < > = values in this interval not displayed.    Recent Labs  Lab 08/19/22 1010 08/19/22 1034 08/19/22 1508 08/20/22 0134 08/20/22 0851 08/20/22 0906 08/21/22 0115 08/21/22 0929 08/22/22 0807 08/23/22 0042 08/24/22 0300 08/25/22 0439  NA 144  --   --  141  --  142 141  --  139 140  --  139  K 3.6  --   --  4.2  --  3.3* 3.2*  --  4.0 3.9  --  3.9  CL 102  --   --  95*  --   --  95*  --  94* 91*  --  94*  CO2 32  --   --  33*  --   --  34*  --  35* 39*  --  36*  ANIONGAP 10  --   --  13  --   --  12  --  10 10  --  9  GLUCOSE 133*  --   --  81  --   --  87  --  110* 95  --  86  BUN 8  --   --  14  --   --  15  --  13 12  --  17  CREATININE 0.48*  --   --  0.73  --   --  0.72  --  0.59* 0.52*  --  0.76  AST 11*  --   --   --   --   --  24  --   --   --   --   --   ALT 13  --   --   --   --   --  15  --   --   --   --   --   ALKPHOS 87  --   --   --   --   --  88  --   --   --   --   --   BILITOT 0.2*  --   --   --   --   --  0.4  --   --   --   --   --   ALBUMIN 2.5*  --   --   --   --   --  2.8*  --   --   --   --   --   CRP  --   --   --   --   --   --   --  10.1* 6.7* 4.2*  --   --   PROCALCITON  --   --   <0.10  --   --   --   --  <0.10 <0.10 <0.05  --   --   INR 2.2*  --   --   --    < >  --  1.8*  --  1.4* 1.6* 1.8* 1.9*  TSH  --   --   --   --   --   --   --  1.550  --   --   --   --   BNP  --    < > 219.0*  --   --   --   --  156.1* 185.9* 128.9*  --  50.2  MG  --   --   --  2.2  --   --  2.1  --  2.1 2.0  --  2.2  CALCIUM 7.3*  --   --  9.0  --   --  8.4*  --  8.9 8.7*  --  8.7*   < > = values in this interval not displayed.     Recent Labs  Lab 08/19/22 1508 08/20/22 0134 08/20/22 0851 08/21/22 0115 08/21/22 0929 08/22/22 0807 08/23/22 0042 08/24/22 0300 08/25/22 0439  CRP  --   --   --   --  10.1* 6.7* 4.2*  --   --   PROCALCITON <0.10  --   --   --  <0.10 <0.10 <0.05  --   --   INR  --   --    < > 1.8*  --  1.4* 1.6* 1.8* 1.9*  TSH  --   --   --   --  1.550  --   --   --   --   BNP 219.0*  --   --   --  156.1* 185.9* 128.9*  --  50.2  MG  --  2.2  --  2.1  --  2.1 2.0  --  2.2  CALCIUM  --  9.0  --  8.4*  --  8.9 8.7*  --  8.7*   < > = values in this interval not displayed.     Radiology Reports No results found.    Signature  -   Lala Lund M.D on 08/25/2022 at 10:16 AM   -  To page go to [email or web address]

## 2022-08-25 NOTE — Plan of Care (Signed)
  Problem: Education: Goal: Knowledge of General Education information will improve Description: Including pain rating scale, medication(s)/side effects and non-pharmacologic comfort measures Outcome: Progressing   Problem: Nutrition: Goal: Adequate nutrition will be maintained Outcome: Progressing   Problem: Coping: Goal: Level of anxiety will decrease Outcome: Progressing   Problem: Elimination: Goal: Will not experience complications related to bowel motility Outcome: Progressing Goal: Will not experience complications related to urinary retention Outcome: Progressing   Problem: Pain Managment: Goal: General experience of comfort will improve Outcome: Progressing   Problem: Safety: Goal: Ability to remain free from injury will improve Outcome: Progressing   

## 2022-08-25 NOTE — TOC Progression Note (Signed)
Transition of Care [facility]) - Progression Note    Patient Details  Name: KAIRYN DESKIN MRN: [id number] Date of Birth: Aug 16, 1955  [facility] CM/SW Contact  Tom-Johnson, Renea Ee, RN Phone Number: 08/25/2022, 2:28 PM  Clinical Narrative:     Per LCSW, [facility] does not accept patient's on Trilogy but will accept BIPAP. MD notified and order changed to BIPAP. CM notified Kristin with [employer]. BIPAP Order Form placed in patient's chart for MD to sign. CM will continue to follow for needs.         Expected Discharge Plan: Long Term Nursing Home Barriers to Discharge: Continued Medical Work up  Expected Discharge Plan and Services In-house Referral: Clinical Social Work Discharge Planning Services: CM Consult   Living arrangements for the past 2 months: [place]                 DME Arranged: Bipap (Trilogy) DME Agency: [employer] Date DME Agency Contacted: 08/23/22 Time DME Agency Contacted: 0930 Representative spoke with at DME Agency: Erasmo [phone]             Social Determinants of Health ([place]) Interventions [place]: Low Risk  (08/23/2022)  Tobacco Use: Medium Risk (08/01/2022)    Readmission Risk Interventions     No data to display

## 2022-08-25 NOTE — Progress Notes (Signed)
[place] for warfarin Indication:  Protein C and S def and + lupus anticoagulant, atrial fibrillation   Allergies  Allergen Reactions   Fenofibrate Other (See Comments)    Per [facility]    Patient Measurements: Height: 6\' 1"  (185.4 cm) Weight: 129.2 kg (284 lb 14.4 oz) IBW/kg (Calculated) : 79.9  Vital Signs: Temp: 98.3 F (36.8 C) (03/18 0508) Temp Source: Oral (03/17 2124) BP: 105/75 (03/18 0508) Pulse Rate: 104 (03/18 0508)  Labs: Recent Labs    08/22/22 0807 08/23/22 0042 08/24/22 0300 08/25/22 0439  HGB 13.5 13.2 13.9 14.0  HCT 42.0 41.8 42.0 43.2  PLT 208 224 219 201  LABPROT 17.0* 18.7* 20.5* 21.8*  INR 1.4* 1.6* 1.8* 1.9*  CREATININE 0.59* 0.52*  --  0.76     Estimated Creatinine Clearance: 131.4 mL/min (by C-G formula based on SCr of 0.76 mg/dL).   Medical History: Past Medical History:  Diagnosis Date   Acute on chronic congestive heart failure (HCC)    Anemia, secondary    SECONDARY TO ACUTE BLOOD LOSS   Anxiety disorder    Bloody stool 08/29/2011   intermittent along with constipation.    CVA (cerebral vascular accident) ([place]) 2010   large left MCA stroke with right hemiparesis   Depression    DVT of lower extremity (deep venous thrombosis) ([place])    RIGHT LOWER; s/p IVC filter 7/12   Dysphagia    Hyperlipidemia    Hypertension    Lupus anticoagulant disorder ([place]) 1990   Morbid obesity (HCC)    PFO (patent foramen ovale)    TEE 2/10: EF 60%, trivial AI, mild Ao root dilatation, mod PFO with R-L shunting, atrial septal aneurysm;   echo 7/12: EF 65-70%, grade 1 diast dysfxn, mild MR, LVOT showed severe obstruction   Protein C deficiency (HCC)    Protein S deficiency (HCC)    Rectus sheath hematoma 7/12   required reversal of anticoagulation and c/b DVT req. IVC filter   Seizure disorder (HCC)     Assessment: James Villegas with hx afib and protein C and protein S deficiency, lupus anticoagulant (PTT-LA) positive  on warfarin PTA. Home regimen appears to be 4 mg daily except 4.5 mg MWF per recent cardiology visit on 08/01/22, however nursing home [facility] reflects regimen is 4.5 mg daily.   INR 1.9 - slightly subtherapeutic but has been trending up. CBC stable. Antibiotics completed 3/16.   Goal of Therapy:  INR 2-3 Monitor platelets by anticoagulation protocol: Yes   Plan:  Give warfarin 4.5 mg PO x1 dose (home dose) Enoxaparin 1 mg/kg SQ Q12h until INR is > 2  Check INR daily while on warfarin Continue to monitor H&H and platelets   Thank you for allowing pharmacy to be a part of this patient's care.  Dimple Nanas, PharmD, BCPS 08/25/2022 8:07 AM

## 2022-08-26 DIAGNOSIS — I509 Heart failure, unspecified: Secondary | ICD-10-CM | POA: Diagnosis not present

## 2022-08-26 DIAGNOSIS — Z7901 Long term (current) use of anticoagulants: Secondary | ICD-10-CM | POA: Diagnosis not present

## 2022-08-26 DIAGNOSIS — J9612 Chronic respiratory failure with hypercapnia: Secondary | ICD-10-CM | POA: Diagnosis not present

## 2022-08-26 DIAGNOSIS — I739 Peripheral vascular disease, unspecified: Secondary | ICD-10-CM | POA: Diagnosis not present

## 2022-08-26 DIAGNOSIS — Z7401 Bed confinement status: Secondary | ICD-10-CM | POA: Diagnosis not present

## 2022-08-26 DIAGNOSIS — R531 Weakness: Secondary | ICD-10-CM | POA: Diagnosis not present

## 2022-08-26 DIAGNOSIS — H259 Unspecified age-related cataract: Secondary | ICD-10-CM | POA: Diagnosis not present

## 2022-08-26 DIAGNOSIS — J9610 Chronic respiratory failure, unspecified whether with hypoxia or hypercapnia: Secondary | ICD-10-CM | POA: Diagnosis not present

## 2022-08-26 DIAGNOSIS — D6859 Other primary thrombophilia: Secondary | ICD-10-CM | POA: Diagnosis not present

## 2022-08-26 DIAGNOSIS — G4733 Obstructive sleep apnea (adult) (pediatric): Secondary | ICD-10-CM | POA: Diagnosis not present

## 2022-08-26 DIAGNOSIS — G459 Transient cerebral ischemic attack, unspecified: Secondary | ICD-10-CM | POA: Diagnosis not present

## 2022-08-26 DIAGNOSIS — K219 Gastro-esophageal reflux disease without esophagitis: Secondary | ICD-10-CM | POA: Diagnosis not present

## 2022-08-26 DIAGNOSIS — Z8616 Personal history of COVID-19: Secondary | ICD-10-CM | POA: Diagnosis not present

## 2022-08-26 DIAGNOSIS — J9601 Acute respiratory failure with hypoxia: Secondary | ICD-10-CM | POA: Diagnosis not present

## 2022-08-26 DIAGNOSIS — G40909 Epilepsy, unspecified, not intractable, without status epilepticus: Secondary | ICD-10-CM | POA: Diagnosis not present

## 2022-08-26 DIAGNOSIS — F339 Major depressive disorder, recurrent, unspecified: Secondary | ICD-10-CM | POA: Diagnosis not present

## 2022-08-26 DIAGNOSIS — H2513 Age-related nuclear cataract, bilateral: Secondary | ICD-10-CM | POA: Diagnosis not present

## 2022-08-26 DIAGNOSIS — Z8673 Personal history of transient ischemic attack (TIA), and cerebral infarction without residual deficits: Secondary | ICD-10-CM | POA: Diagnosis not present

## 2022-08-26 DIAGNOSIS — F331 Major depressive disorder, recurrent, moderate: Secondary | ICD-10-CM | POA: Diagnosis not present

## 2022-08-26 DIAGNOSIS — R0989 Other specified symptoms and signs involving the circulatory and respiratory systems: Secondary | ICD-10-CM | POA: Diagnosis not present

## 2022-08-26 DIAGNOSIS — I4891 Unspecified atrial fibrillation: Secondary | ICD-10-CM | POA: Diagnosis not present

## 2022-08-26 DIAGNOSIS — I482 Chronic atrial fibrillation, unspecified: Secondary | ICD-10-CM | POA: Diagnosis not present

## 2022-08-26 DIAGNOSIS — E875 Hyperkalemia: Secondary | ICD-10-CM | POA: Diagnosis not present

## 2022-08-26 DIAGNOSIS — J9600 Acute respiratory failure, unspecified whether with hypoxia or hypercapnia: Secondary | ICD-10-CM | POA: Diagnosis not present

## 2022-08-26 DIAGNOSIS — J9620 Acute and chronic respiratory failure, unspecified whether with hypoxia or hypercapnia: Secondary | ICD-10-CM | POA: Diagnosis not present

## 2022-08-26 DIAGNOSIS — L603 Nail dystrophy: Secondary | ICD-10-CM | POA: Diagnosis not present

## 2022-08-26 DIAGNOSIS — G8222 Paraplegia, incomplete: Secondary | ICD-10-CM | POA: Diagnosis not present

## 2022-08-26 DIAGNOSIS — I5022 Chronic systolic (congestive) heart failure: Secondary | ICD-10-CM | POA: Diagnosis not present

## 2022-08-26 DIAGNOSIS — I110 Hypertensive heart disease with heart failure: Secondary | ICD-10-CM | POA: Diagnosis not present

## 2022-08-26 DIAGNOSIS — H04123 Dry eye syndrome of bilateral lacrimal glands: Secondary | ICD-10-CM | POA: Diagnosis not present

## 2022-08-26 DIAGNOSIS — R569 Unspecified convulsions: Secondary | ICD-10-CM | POA: Diagnosis not present

## 2022-08-26 DIAGNOSIS — I5032 Chronic diastolic (congestive) heart failure: Secondary | ICD-10-CM | POA: Diagnosis not present

## 2022-08-26 DIAGNOSIS — E662 Morbid (severe) obesity with alveolar hypoventilation: Secondary | ICD-10-CM | POA: Diagnosis not present

## 2022-08-26 DIAGNOSIS — R0602 Shortness of breath: Secondary | ICD-10-CM | POA: Diagnosis not present

## 2022-08-26 DIAGNOSIS — J9602 Acute respiratory failure with hypercapnia: Secondary | ICD-10-CM | POA: Diagnosis not present

## 2022-08-26 DIAGNOSIS — Z515 Encounter for palliative care: Secondary | ICD-10-CM | POA: Diagnosis not present

## 2022-08-26 DIAGNOSIS — Z86718 Personal history of other venous thrombosis and embolism: Secondary | ICD-10-CM | POA: Diagnosis not present

## 2022-08-26 DIAGNOSIS — F39 Unspecified mood [affective] disorder: Secondary | ICD-10-CM | POA: Diagnosis not present

## 2022-08-26 DIAGNOSIS — Z9981 Dependence on supplemental oxygen: Secondary | ICD-10-CM | POA: Diagnosis not present

## 2022-08-26 DIAGNOSIS — S82301D Unspecified fracture of lower end of right tibia, subsequent encounter for closed fracture with routine healing: Secondary | ICD-10-CM | POA: Diagnosis not present

## 2022-08-26 DIAGNOSIS — I69351 Hemiplegia and hemiparesis following cerebral infarction affecting right dominant side: Secondary | ICD-10-CM | POA: Diagnosis not present

## 2022-08-26 DIAGNOSIS — Z6841 Body Mass Index (BMI) 40.0 and over, adult: Secondary | ICD-10-CM | POA: Diagnosis not present

## 2022-08-26 DIAGNOSIS — B351 Tinea unguium: Secondary | ICD-10-CM | POA: Diagnosis not present

## 2022-08-26 DIAGNOSIS — J9621 Acute and chronic respiratory failure with hypoxia: Secondary | ICD-10-CM | POA: Diagnosis not present

## 2022-08-26 DIAGNOSIS — J9622 Acute and chronic respiratory failure with hypercapnia: Secondary | ICD-10-CM | POA: Diagnosis not present

## 2022-08-26 LAB — CBC WITH DIFFERENTIAL/PLATELET
Abs Immature Granulocytes: 0.04 10*3/uL (ref 0.00–0.07)
Basophils Absolute: 0.1 10*3/uL (ref 0.0–0.1)
Basophils Relative: 1 %
Eosinophils Absolute: 0.6 10*3/uL — ABNORMAL HIGH (ref 0.0–0.5)
Eosinophils Relative: 9 %
HCT: 44.1 % (ref 39.0–52.0)
Hemoglobin: 14.6 g/dL (ref 13.0–17.0)
Immature Granulocytes: 1 %
Lymphocytes Relative: 17 %
Lymphs Abs: 1.2 10*3/uL (ref 0.7–4.0)
MCH: 30.9 pg (ref 26.0–34.0)
MCHC: 33.1 g/dL (ref 30.0–36.0)
MCV: 93.2 fL (ref 80.0–100.0)
Monocytes Absolute: 0.8 10*3/uL (ref 0.1–1.0)
Monocytes Relative: 10 %
Neutro Abs: 4.6 10*3/uL (ref 1.7–7.7)
Neutrophils Relative %: 62 %
Platelets: 228 10*3/uL (ref 150–400)
RBC: 4.73 MIL/uL (ref 4.22–5.81)
RDW: 13.1 % (ref 11.5–15.5)
WBC: 7.2 10*3/uL (ref 4.0–10.5)
nRBC: 0 % (ref 0.0–0.2)

## 2022-08-26 LAB — BASIC METABOLIC PANEL
Anion gap: 8 (ref 5–15)
BUN: 16 mg/dL (ref 8–23)
CO2: 37 mmol/L — ABNORMAL HIGH (ref 22–32)
Calcium: 8.7 mg/dL — ABNORMAL LOW (ref 8.9–10.3)
Chloride: 94 mmol/L — ABNORMAL LOW (ref 98–111)
Creatinine, Ser: 0.63 mg/dL (ref 0.61–1.24)
GFR, Estimated: >60 mL/min (ref >60)
Glucose, Bld: 83 mg/dL (ref 70–99)
Potassium: 3.8 mmol/L (ref 3.5–5.1)
Sodium: 139 mmol/L (ref 135–145)

## 2022-08-26 LAB — BRAIN NATRIURETIC PEPTIDE: B Natriuretic Peptide: 39.1 pg/mL (ref 0.0–100.0)

## 2022-08-26 LAB — MAGNESIUM: Magnesium: 2.3 mg/dL (ref 1.7–2.4)

## 2022-08-26 LAB — PROTIME-INR
INR: 2.1 — ABNORMAL HIGH (ref 0.8–1.2)
Prothrombin Time: 23.2 seconds — ABNORMAL HIGH (ref 11.4–15.2)

## 2022-08-26 MED ORDER — WARFARIN SODIUM 4 MG PO TABS
4.5000 mg | ORAL_TABLET | Freq: Once | ORAL | Status: DC
  Filled 2022-08-26: qty 1

## 2022-08-26 MED ORDER — TORSEMIDE 40 MG PO TABS: 40.0000 mg | ORAL_TABLET | Freq: Two times a day (BID) | ORAL | Status: DC

## 2022-08-26 MED ORDER — POTASSIUM CHLORIDE ER 20 MEQ PO TBCR: 20.0000 meq | EXTENDED_RELEASE_TABLET | Freq: Every day | ORAL | Status: DC

## 2022-08-26 NOTE — Progress Notes (Signed)
RT called to room by RN. Pt currently on cpap and Pt sats were 88%, so RT bled in 5L O2 into cpap and now Pt sats currently 93% and Pt is resting comfortably at this time.

## 2022-08-26 NOTE — Plan of Care (Signed)

## 2022-08-26 NOTE — Telephone Encounter (Signed)
I s/w Suzanna, RN at [facility] in regard to the [place] (watchpatone) sleep study. RN states she did not know anything about the sleep study to be done, see previous notes. I stated the pt was sent back to the SNF with the device and the app was placed on his phone for the sleep study. I asked RN would she be able to she if she can get his phone to see if we could go over instructions. I then placed on hold, RN returned to the line and said she could not find his phone. She tells me that the pt has been admitted to the hospital since 08/19/22. I asked if she would please call me when he is back at the facility so we can discuss about getting sleep study done. I gave my direct line [phone] James Villegas/Home Sleep Studies. RN is agreeable to plan of care. I will update the cardiologist as well.

## 2022-08-26 NOTE — Discharge Summary (Signed)
James BROUSE [id number] DOB: May 19, 1956 DOA: 08/19/2022  PCP: Raymondo Band, MD  Admit date: 08/19/2022  Discharge date: 08/26/2022  Admitted From: SNF   Disposition:  SNF   Recommendations for Outpatient Follow-up:   Follow up with PCP in 1-2 weeks  PCP Please obtain BMP/CBC, 2 view CXR in 1week,  (see Discharge instructions)   PCP Please follow up on the following pending results: Check CBC, magnesium, CMP, INR in 2 to 3 days.  Outpatient sleep study within 1 to 2 weeks of discharge.   Home Health: None   Equipment/Devices: None  Consultations: PCCM Discharge Condition: Stable    CODE STATUS: Full    Diet Recommendation: Heart Healthy, hydrate diet with 1.5 L fluid restriction, check CBGs q. ACH S.    Chief Complaint  Patient presents with   Shortness of Breath     Brief history of present illness from the day of admission and additional interim summary     67 year old male with past medical history as below, which is significant for stroke with residual right-sided hemiplegia, dysphagia, lupus, CHF, atrial fibrillation, hypercoagulable disorder with protein C and protein S deficiencies. And suspected OSA/OHS, although patient had never received a formal sleep study despite multiple efforts. The patient resides in SNF from where he [facility] ED on 3/12 with complaints of shortness of breath and vomiting. While in the emergency department the patient removed his oxygen and had oxygen saturations dipped to as low as 35% at which point he became minimally responsive.    His further workup was suggestive of acute hypoxic and hypercapnic respiratory failure likely due to underlying OSA OHS.  He was kept in the hospital treated with CPAP/BiPAP after which he showed improvement he was  transitioned to my care on 08/21/2022 on day 2 of his hospital stay.                                                                 Hospital Course   Acute on chronic hypoxic and hypercapnic respiratory failure causing severe metabolic encephalopathy in the setting of underlying undiagnosed OSA/OHS.  Worsened by fluid overload due to CHF.  Patient much improved after wearing BiPAP, ABGs have showed good compensation and improvement, daytime oxygen and nighttime CPAP/BiPAP will be continued, outpatient sleep study.  Will like to see him qualify for noninvasive ventilatory device upon discharge to SNF.  He has been counseled on compliance.  He is agreeable.  Also he is remarkably better after diuresis on 08/21/2022, on 08/21/2022 despite not wearing BiPAP as it was not provided to him by the RT patient is doing remarkably better on 08/22/2022 suggesting that there is a large component of his respiratory failure was due to fluid overload in conjunction with OSA/OHS.  He did wear  his provided BiPAP on the night of 08/23/2022 and felt better, says he will do his best to be compliant at SNF as well.    This time he is symptom-free close to his baseline will be discharged home with nighttime BiPAP which has been ordered along with daytime oxygen which he uses his home between 5 to 7 L on a continuous basis.  Will request SNF MD to please get an outpatient sleep study done within 1 to 2 weeks of discharge.  Bedside PFT done  FEV1 31%. PFT Report is in patient chart    Overnight Pox - Dates: 08/23/2022-08/24/2022   Length: 5hrs 46mins Desats: 41 (7.2/hr) SpO2 < 89%: 2H 31M (35.2%)   Pulse Rate                              SpO2 Range: 69-116                        76-100% Avg: 92.7                                 89.7% STD DEV: 4.6                         5          Morbid obesity with OSA OHS.  Plan as above.  PCP to follow for weight loss, BMI close to 40.   Acute on chronic diastolic CHF EF [id number] on recent  echocardiogram.  Has evidence of fluid overload.  Placed on higher than home dose diuretics and potassium supplementation, diurese and monitor.  Improved with diuresis Home diuretic dose increased, PCP to monitor BMP, magnesium on a close basis.   Chronic atrial fibrillation.  Mali vas 2 score of greater than 4.  Not on rate controlling agents, continue Coumadin per pharmacy.  INR now therapeutic.  Monitor at SNF.   History of hypercoagulable state with protein C&S deficiency.  Coumadin/Lovenox per pharmacy.   History of stroke with right-sided hemiplegia, dysphagia and dysarthria.  Speech to follow, PT OT, supportive care.  On Coumadin along with statin and Zetia for secondary prevention.   Dyslipidemia.  Statin and Zetia.   History of lupus.  Supportive care.   Seizure disorder.  Continue home dose SNF to monitor Dilantin levels intermittently.  Discharge diagnosis     Principal Problem:   Acute respiratory failure with hypoxia and hypercarbia (HCC) Active Problems:   Permanent atrial fibrillation (HCC)   History of cardioembolic cerebrovascular accident (CVA)    Discharge instructions    Discharge Instructions     Discharge instructions   Complete by: As directed    Follow with Primary MD Raymondo Band, MD in 7 days, get a referral for outpatient sleep study.  Wear your ordered BiPAP every night.  Get CBC, CMP, INR, magnesium, 2 view Chest X ray -  checked by SNF MD in 2 to 3 days.  Activity: As tolerated with Full fall precautions use walker/cane & assistance as needed  Disposition SNF  Diet: Heart Healthy low carbohydrate diet with strict 1500 cc fluid restriction per day, check CBGs q. ACH S.  Check your Weight same time everyday, if you gain over 2 pounds, or you develop in leg swelling, experience more shortness of breath or chest pain, call your  Primary MD immediately. Follow Cardiac Low Salt Diet and 1.5 lit/day fluid restriction.  Special Instructions: If you  have smoked or chewed Tobacco  in the last 2 yrs please stop smoking, stop any regular Alcohol  and or any Recreational drug use.  On your next visit with your primary care physician please Get Medicines reviewed and adjusted.  Please request your Prim.MD to go over all Hospital Tests and Procedure/Radiological results at the follow up, please get all Hospital records sent to your Prim MD by signing hospital release before you go home.  If you experience worsening of your admission symptoms, develop shortness of breath, life threatening emergency, suicidal or homicidal thoughts you must seek medical attention immediately by calling 911 or calling your MD immediately  if symptoms less severe.  You Must read complete instructions/literature along with all the possible adverse reactions/side effects for all the Medicines you take and that have been prescribed to you. Take any new Medicines after you have completely understood and accpet all the possible adverse reactions/side effects.   Increase activity slowly   Complete by: As directed    No wound care   Complete by: As directed        Discharge Medications   Allergies as of 08/26/2022       Reactions   Fenofibrate Other (See Comments)   Per MAR        Medication List     TAKE these medications    acetaminophen 325 MG tablet Commonly known as: TYLENOL Take 650 mg by mouth every 6 (six) hours as needed for mild pain, headache or fever.   cycloSPORINE 0.05 % ophthalmic emulsion Commonly known as: RESTASIS Place 1 drop into both eyes 2 (two) times daily.   ezetimibe 10 MG tablet Commonly known as: ZETIA Take 10 mg by mouth daily.   Jardiance 10 MG Tabs tablet Generic drug: empagliflozin Take by mouth daily.   losartan 25 MG tablet Commonly known as: COZAAR Take 12.5 mg by mouth daily.   omeprazole 20 MG capsule Commonly known as: PRILOSEC Take 20 mg by mouth daily.   OXYGEN Inhale into the lungs See admin  instructions. Nightly 9p-8a   phenytoin 100 MG ER capsule Commonly known as: DILANTIN Take 200 mg by mouth 2 (two) times daily.   phenytoin 50 MG tablet Commonly known as: DILANTIN Chew 50 mg by mouth at bedtime. Give with 200mg  capsules for total dose of 250mg  qhs   potassium chloride SA 20 MEQ tablet Commonly known as: KLOR-CON M Take 2 tablets (40 mEq total) by mouth daily.   Refresh Optive Advanced 0.5-1-0.5 % Soln Generic drug: Carboxymeth-Glycerin-Polysorb Place 1 drop into both eyes in the morning, at noon, in the evening, and at bedtime. Wait 3-5 minutes between eye medications   rosuvastatin 40 MG tablet Commonly known as: CRESTOR Take 40 mg by mouth at bedtime.   spironolactone 25 MG tablet Commonly known as: ALDACTONE Take 1 tablet (25 mg total) by mouth daily.   Torsemide 40 MG Tabs Take 40 mg by mouth 2 (two) times daily. What changed:  medication strength how much to take   traZODone 50 MG tablet Commonly known as: DESYREL Take 50 mg by mouth at bedtime.   venlafaxine XR 37.5 MG 24 hr capsule Commonly known as: EFFEXOR-XR Take 37.5 mg by mouth at bedtime.   warfarin 2.5 MG tablet Commonly known as: COUMADIN Take as directed. If you are unsure how to take this medication, talk to your nurse  or doctor. Original instructions: Take 2.5 mg by mouth daily. Give with the 2mg  to equal 4.5mg    warfarin 2 MG tablet Commonly known as: COUMADIN Take as directed. If you are unsure how to take this medication, talk to your nurse or doctor. Original instructions: Take 2 mg by mouth daily. Take with 2.5mg  tablet for total dose of 4.5mg          Follow-up Information     Raymondo Band, MD. Schedule an appointment as soon as possible for a visit in 1 week(s).   Specialty: Family Medicine Contact information: [address] [place] [address] [phone]         Chesley Mires, MD. Schedule an appointment as soon as possible for a visit in  1 week(s).   Specialties: Pulmonary Disease, Sleep Medicine Why: sleep study Contact information: [address] [place] [address] [phone]                 Major procedures and Radiology Reports - PLEASE review detailed and final reports thoroughly  -        DG Chest Port 1 View  Result Date: 08/21/2022 CLINICAL DATA:  Shortness of breath EXAM: PORTABLE CHEST 1 VIEW COMPARISON:  Chest radiograph dated 08/19/2022 FINDINGS: Normal lung volumes. Similar asymmetric patchy left lung opacities. Increased left basilar linear opacity. Similar blunting of the bilateral costophrenic angles. No pneumothorax. Similar cardiomediastinal silhouette. The visualized skeletal structures are unremarkable. IMPRESSION: 1. Similar asymmetric patchy left lung opacities, which may reflect pulmonary edema. Increased left basilar linear opacity, favor atelectasis. 2. Similar blunting of the bilateral costophrenic angles, likely pleural effusion. Electronically Signed   By: Darrin Nipper M.D.   On: 08/21/2022 09:47   DG Chest Portable 1 View  Result Date: 08/19/2022 CLINICAL DATA:  Shortness of breath. EXAM: PORTABLE CHEST 1 VIEW COMPARISON:  Chest x-ray dated September 16, 2021. FINDINGS: Unchanged cardiomegaly, pulmonary vascular congestion, and mild diffuse interstitial thickening. Unchanged small bilateral pleural effusions. No pneumothorax. No acute osseous abnormality. IMPRESSION: 1. Unchanged mild congestive heart failure. Electronically Signed   By: Titus Dubin M.D.   On: 08/19/2022 11:00    Micro Results    Recent Results (from the past 240 hour(s))  Resp panel by RT-PCR (RSV, Flu A&B, Covid) Anterior Nasal Swab     Status: None   Collection Time: 08/19/22 10:00 AM   Specimen: Anterior Nasal Swab  Result Value Ref Range Status   SARS Coronavirus 2 by RT PCR NEGATIVE NEGATIVE Final   Influenza A by PCR NEGATIVE NEGATIVE Final   Influenza B by PCR NEGATIVE NEGATIVE Final     Comment: (NOTE) The Xpert Xpress SARS-CoV-2/FLU/RSV plus assay is intended as an aid in the diagnosis of influenza from Nasopharyngeal swab specimens and should not be used as a sole basis for treatment. Nasal washings and aspirates are unacceptable for Xpert Xpress SARS-CoV-2/FLU/RSV testing.  Fact Sheet for Patients: EntrepreneurPulse.com.au  Fact Sheet for Healthcare Providers: IncredibleEmployment.be  This test is not yet approved or cleared by the [place] FDA and has been authorized for detection and/or diagnosis of SARS-CoV-2 by FDA under an Emergency Use Authorization (EUA). This EUA will remain in effect (meaning this test can be used) for the duration of the COVID-19 declaration under Section 564(b)(1) of the Act, 21 U.S.C. section 360bbb-3(b)(1), unless the authorization is terminated or revoked.     Resp Syncytial Virus by PCR NEGATIVE NEGATIVE Final    Comment: (NOTE) Fact  Sheet for Patients: EntrepreneurPulse.com.au  Fact Sheet for Healthcare Providers: IncredibleEmployment.be  This test is not yet approved or cleared by the [place] FDA and has been authorized for detection and/or diagnosis of SARS-CoV-2 by FDA under an Emergency Use Authorization (EUA). This EUA will remain in effect (meaning this test can be used) for the duration of the COVID-19 declaration under Section 564(b)(1) of the Act, 21 U.S.C. section 360bbb-3(b)(1), unless the authorization is terminated or revoked.  Performed at [facility], [place] [address] [place], [place] [address]   Culture, blood (Routine X 2) w Reflex to ID Panel     Status: None   Collection Time: 08/19/22  3:08 PM   Specimen: BLOOD LEFT HAND  Result Value Ref Range Status   Specimen Description BLOOD LEFT HAND  Final   Special Requests   Final    BOTTLES DRAWN AEROBIC ONLY Blood Culture adequate volume   Culture   Final    NO  GROWTH 5 DAYS Performed at [facility], [place] [address], [place], [place] [address]    Report Status 08/24/2022 FINAL  Final  Culture, blood (Routine X 2) w Reflex to ID Panel     Status: None   Collection Time: 08/19/22  3:14 PM   Specimen: BLOOD LEFT HAND  Result Value Ref Range Status   Specimen Description BLOOD LEFT HAND  Final   Special Requests   Final    BOTTLES DRAWN AEROBIC ONLY Blood Culture adequate volume   Culture   Final    NO GROWTH 5 DAYS Performed at [facility], [place] [address], [place], [place] [address]    Report Status 08/24/2022 FINAL  Final  MRSA Next Gen by PCR, Nasal     Status: None   Collection Time: 08/20/22  2:56 PM   Specimen: Nasal Mucosa; Nasal Swab  Result Value Ref Range Status   MRSA by PCR Next Gen NOT DETECTED NOT DETECTED Final    Comment: (NOTE) The GeneXpert MRSA Assay (FDA approved for NASAL specimens only), is one component of a comprehensive MRSA colonization surveillance program. It is not intended to diagnose MRSA infection nor to guide or monitor treatment for MRSA infections. Test performance is not FDA approved in patients less than 77 years old. Performed at [facility], [place] [address] [place], [place] [address]     Today   Subjective    Moussa Kowalchuk today has no headache,no chest abdominal pain,no new weakness tingling or numbness, feels much bette    Objective   Blood pressure 139/77, pulse 89, temperature 97.6 F (36.4 C), temperature source Oral, resp. rate 18, height 6\' 1"  (1.854 m), weight 113.3 kg, SpO2 93 %.   Intake/Output Summary (Last 24 hours) at 08/26/2022 0956 Last data filed at 08/26/2022 0804 Gross per 24 hour  Intake 460 ml  Output 2475 ml  Net -2015 ml    Exam  Awake Alert, underlying right-sided weakness from previous stroke strength 1/5, underlying dysarthria from previous stroke, 1+ leg edema and left leg 2+ and right leg [place].AT,PERRAL Supple Neck, No JVD,   Symmetrical  Chest wall movement, Good air movement bilaterally, few rales RRR,No Gallops,Rubs or new Murmurs,  +ve B.Sounds, Abd Soft, No tenderness,   1+ leg edema and left leg 2+ and right leg    Data Review   Recent Labs  Lab 08/19/22 1010 08/20/22 0134 08/22/22 0807 08/23/22 0042 08/24/22 0300 08/25/22 0439 08/26/22 0034  WBC 8.8   < >  7.3 7.1 8.1 6.6 7.2  HGB 11.4*   < > 13.5 13.2 13.9 14.0 14.6  HCT 37.5*   < > 42.0 41.8 42.0 43.2 44.1  PLT 182   < > 208 224 219 201 228  MCV 101.9*   < > 96.3 95.9 94.2 94.1 93.2  MCH 31.0   < > 31.0 30.3 31.2 30.5 30.9  MCHC 30.4   < > 32.1 31.6 33.1 32.4 33.1  RDW 13.7   < > 13.4 13.3 13.2 13.2 13.1  LYMPHSABS 0.4*  --   --   --   --  0.9 1.2  MONOABS 0.8  --   --   --   --  0.6 0.8  EOSABS 0.0  --   --   --   --  0.6* 0.6*  BASOSABS 0.0  --   --   --   --  0.1 0.1   < > = values in this interval not displayed.    Recent Labs  Lab 08/19/22 1010 08/19/22 1034 08/19/22 1508 08/20/22 0134 08/21/22 0115 08/21/22 0929 08/22/22 0807 08/23/22 0042 08/24/22 0300 08/25/22 0439 08/26/22 0034  NA 144  --   --    < > 141  --  139 140  --  139 139  K 3.6  --   --    < > 3.2*  --  4.0 3.9  --  3.9 3.8  CL 102  --   --    < > 95*  --  94* 91*  --  94* 94*  CO2 32  --   --    < > 34*  --  35* 39*  --  36* 37*  ANIONGAP 10  --   --    < > 12  --  10 10  --  9 8  GLUCOSE 133*  --   --    < > 87  --  110* 95  --  86 83  BUN 8  --   --    < > 15  --  13 12  --  17 16  CREATININE 0.48*  --   --    < > 0.72  --  0.59* 0.52*  --  0.76 0.63  AST 11*  --   --   --  24  --   --   --   --   --   --   ALT 13  --   --   --  15  --   --   --   --   --   --   ALKPHOS 87  --   --   --  88  --   --   --   --   --   --   BILITOT 0.2*  --   --   --  0.4  --   --   --   --   --   --   ALBUMIN 2.5*  --   --   --  2.8*  --   --   --   --   --   --   CRP  --   --   --   --   --  10.1* 6.7* 4.2*  --   --   --   PROCALCITON  --   --  <0.10  --   --  <0.10 <0.10 <0.05   --   --   --  INR 2.2*  --   --    < > 1.8*  --  1.4* 1.6* 1.8* 1.9* 2.1*  TSH  --   --   --   --   --  1.550  --   --   --   --   --   BNP  --    < > 219.0*  --   --  156.1* 185.9* 128.9*  --  50.2 39.1  MG  --   --   --    < > 2.1  --  2.1 2.0  --  2.2 2.3  CALCIUM 7.3*  --   --    < > 8.4*  --  8.9 8.7*  --  8.7* 8.7*   < > = values in this interval not displayed.     Total Time in preparing paper work, data evaluation and todays exam - 35 minutes  Signature  -    Lala Lund M.D on 08/26/2022 at 9:56 AM   -  To page go to [email or web address]

## 2022-08-26 NOTE — Progress Notes (Signed)
[facility] will set up cpap/bipap at facility. CM to notify Erasmo Downer when patient is leaving and meet him at the facility.

## 2022-08-26 NOTE — Progress Notes (Signed)
DISCHARGE NOTE HOME James Villegas to be discharged Skilled nursing facility per MD order. Diagnosis, treatment, medications, and follow up appointments discussed with the patient and Suzsana Mijangoes LPN at [facility]. Medication list explained in detail. Skin clean, dry and intact without evidence of skin break down, no evidence of skin tears noted. IV catheter discontinued intact. Site without signs and symptoms of complications. Dressing and pressure applied. Pt denies pain at the site currently. No complaints noted. VSS. Nop complaints of pain.    An After Visit Summary (AVS) was printed and given to the patient. Patient escorted via wheelchair, and discharged home via private auto.  Virgina Jock, RN

## 2022-08-26 NOTE — Discharge Instructions (Addendum)
Follow with Primary MD Raymondo Band, MD in 7 days, get a referral for outpatient sleep study.  Wear your ordered BiPAP every night.  Get CBC, CMP, INR, magnesium, 2 view Chest X ray -  checked by SNF MD in 2 to 3 days.  Activity: As tolerated with Full fall precautions use walker/cane & assistance as needed  Disposition SNF  Diet: Heart Healthy low carbohydrate diet with strict 1500 cc fluid restriction per day, check CBGs q. ACH S.  Check your Weight same time everyday, if you gain over 2 pounds, or you develop in leg swelling, experience more shortness of breath or chest pain, call your Primary MD immediately. Follow Cardiac Low Salt Diet and 1.5 lit/day fluid restriction.  Special Instructions: If you have smoked or chewed Tobacco  in the last 2 yrs please stop smoking, stop any regular Alcohol  and or any Recreational drug use.  On your next visit with your primary care physician please Get Medicines reviewed and adjusted.  Please request your Prim.MD to go over all Hospital Tests and Procedure/Radiological results at the follow up, please get all Hospital records sent to your Prim MD by signing hospital release before you go home.  If you experience worsening of your admission symptoms, develop shortness of breath, life threatening emergency, suicidal or homicidal thoughts you must seek medical attention immediately by calling 911 or calling your MD immediately  if symptoms less severe.  You Must read complete instructions/literature along with all the possible adverse reactions/side effects for all the Medicines you take and that have been prescribed to you. Take any new Medicines after you have completely understood and accpet all the possible adverse reactions/side effects.

## 2022-08-26 NOTE — Progress Notes (Addendum)
[place] for warfarin Indication:  Protein C and S def and + lupus anticoagulant, atrial fibrillation   Allergies  Allergen Reactions   Fenofibrate Other (See Comments)    Per [facility]    Patient Measurements: Height: 6\' 1"  (185.4 cm) Weight: 113.3 kg (249 lb 12.8 oz) IBW/kg (Calculated) : 79.9  Vital Signs: Temp: 98.9 F (37.2 C) (03/19 0518) BP: 115/72 (03/19 0518) Pulse Rate: 100 (03/19 0518)  Labs: Recent Labs    08/24/22 0300 08/25/22 0439 08/26/22 0034  HGB 13.9 14.0 14.6  HCT 42.0 43.2 44.1  PLT 219 201 228  LABPROT 20.5* 21.8* 23.2*  INR 1.8* 1.9* 2.1*  CREATININE  --  0.76 0.63     Estimated Creatinine Clearance: 123.1 mL/min (by C-G formula based on SCr of 0.63 mg/dL).   Medical History: Past Medical History:  Diagnosis Date   Acute on chronic congestive heart failure (HCC)    Anemia, secondary    SECONDARY TO ACUTE BLOOD LOSS   Anxiety disorder    Bloody stool 08/29/2011   intermittent along with constipation.    CVA (cerebral vascular accident) ([place]) 2010   large left MCA stroke with right hemiparesis   Depression    DVT of lower extremity (deep venous thrombosis) ([place])    RIGHT LOWER; s/p IVC filter 7/12   Dysphagia    Hyperlipidemia    Hypertension    Lupus anticoagulant disorder ([place]) 1990   Morbid obesity (HCC)    PFO (patent foramen ovale)    TEE 2/10: EF 60%, trivial AI, mild Ao root dilatation, mod PFO with R-L shunting, atrial septal aneurysm;   echo 7/12: EF 65-70%, grade 1 diast dysfxn, mild MR, LVOT showed severe obstruction   Protein C deficiency (HCC)    Protein S deficiency (HCC)    Rectus sheath hematoma 7/12   required reversal of anticoagulation and c/b DVT req. IVC filter   Seizure disorder (HCC)     Assessment: 50 YOM with hx afib and protein C and protein S deficiency, lupus anticoagulant (PTT-LA) positive on warfarin PTA. Home regimen appears to be 4 mg daily except 4.5 mg MWF per  recent cardiology visit on 08/01/22, however nursing home [facility] reflects regimen is 4.5 mg daily.   INR therapeutic at 2.1. Discontinue Lovenox. CBC stable.   Goal of Therapy:  INR 2-3 Monitor platelets by anticoagulation protocol: Yes   Plan:  Give warfarin 4.5 mg PO x1 dose (home dose) Discontinue Lovenox since INR > 2 Check INR daily while on warfarin Continue to monitor H&H and platelets Would plan to resume home regimen of warfarin 4.5mg  daily (per nursing home [facility]) and recheck INR Thurs-Fri   Thank you for allowing pharmacy to be a part of this patient's care.  Dimple Nanas, PharmD, BCPS 08/26/2022 7:22 AM

## 2022-08-26 NOTE — TOC Transition Note (Signed)
Transition of Care [facility]) - CM/SW Discharge Note   Patient Details  Name: James Villegas MRN: [id number] Date of Birth: 21-Apr-1956  Transition of Care [facility]) CM/SW Contact:  Tom-Johnson, Renea Ee, RN Phone Number: 08/26/2022, 11:58 AM   Clinical Narrative:     Patient is scheduled for discharge to [facility] today. Readmission Prevention Assessment done. RN to call report to [phone]). [employer] to setup BIPAP at [facility] upon patient's arrival.  Hospital f/u and discharge instructions on AVS.  PTAR scheduled for transportation at discharge. No further TOC needs noted.        Final next level of care: Long Term Acute Care (LTAC) Barriers to Discharge: Barriers Resolved   Patient Goals and CMS Choice CMS Medicare.gov Compare Post Acute Care list provided to:: Patient Choice offered to / list presented to : Patient  Discharge Placement                  Patient to be transferred to facility by: PTAR      Discharge Plan and Services Additional resources added to the After Visit Summary for   In-house Referral: Clinical Social Work Discharge Planning Services: CM Consult            DME Arranged: Bipap DME Agency: [employer] Date DME Agency Contacted: 08/25/22 Time DME Agency Contacted: 1000 Representative spoke with at DME Agency: Erasmo Downer HH Arranged: NA [place] Agency: NA        Social Determinants of Health ([place]) Interventions [place]: Low Risk  (08/23/2022)  Tobacco Use: Medium Risk (08/01/2022)     Readmission Risk Interventions    08/26/2022   11:56 AM  Readmission Risk Prevention Plan  Transportation Screening Complete  PCP or Specialist Appt within 5-7 Days Complete  Home Care Screening Complete  Medication Review (RN CM) Referral to Pharmacy

## 2022-08-29 DIAGNOSIS — R0602 Shortness of breath: Secondary | ICD-10-CM | POA: Diagnosis not present

## 2022-08-29 DIAGNOSIS — R0989 Other specified symptoms and signs involving the circulatory and respiratory systems: Secondary | ICD-10-CM | POA: Diagnosis not present

## 2022-09-01 DIAGNOSIS — Z7901 Long term (current) use of anticoagulants: Secondary | ICD-10-CM | POA: Diagnosis not present

## 2022-09-01 DIAGNOSIS — D6859 Other primary thrombophilia: Secondary | ICD-10-CM | POA: Diagnosis not present

## 2022-09-01 DIAGNOSIS — J9612 Chronic respiratory failure with hypercapnia: Secondary | ICD-10-CM | POA: Diagnosis not present

## 2022-09-01 DIAGNOSIS — E662 Morbid (severe) obesity with alveolar hypoventilation: Secondary | ICD-10-CM | POA: Diagnosis not present

## 2022-09-01 DIAGNOSIS — Z6841 Body Mass Index (BMI) 40.0 and over, adult: Secondary | ICD-10-CM | POA: Diagnosis not present

## 2022-09-01 DIAGNOSIS — Z9981 Dependence on supplemental oxygen: Secondary | ICD-10-CM | POA: Diagnosis not present

## 2022-09-01 DIAGNOSIS — I5032 Chronic diastolic (congestive) heart failure: Secondary | ICD-10-CM | POA: Diagnosis not present

## 2022-09-01 DIAGNOSIS — G40909 Epilepsy, unspecified, not intractable, without status epilepticus: Secondary | ICD-10-CM | POA: Diagnosis not present

## 2022-09-01 DIAGNOSIS — F39 Unspecified mood [affective] disorder: Secondary | ICD-10-CM | POA: Diagnosis not present

## 2022-09-01 DIAGNOSIS — I482 Chronic atrial fibrillation, unspecified: Secondary | ICD-10-CM | POA: Diagnosis not present

## 2022-09-01 DIAGNOSIS — I69351 Hemiplegia and hemiparesis following cerebral infarction affecting right dominant side: Secondary | ICD-10-CM | POA: Diagnosis not present

## 2022-09-05 NOTE — Telephone Encounter (Addendum)
I returned sister's call from earlier today. Pt's sister Manuela Schwartz wanted to update our office that the pt had been in the hospital from 3/12-3/19 and was d/c with a BiPap sent back to SNF with the pt. She said the bipap seems to be helping her brother breathe easier. I stated that I will update the ordering provider of the Itamar as we may not need to proceed with the study as pt is now on a bipap. I did tell the pt's sister who is in [place] that I did s/w the nurse at the SNF. The SNF nurse said she could not find the box with the Itamar device. I thanked Manuela Schwartz (pt's sister) for the update.   I am not sure how we will get the device back to our office as the pt is in SNF and his sister lives in [place].

## 2022-09-16 DIAGNOSIS — Z9981 Dependence on supplemental oxygen: Secondary | ICD-10-CM | POA: Diagnosis not present

## 2022-09-16 DIAGNOSIS — I4891 Unspecified atrial fibrillation: Secondary | ICD-10-CM | POA: Diagnosis not present

## 2022-09-16 DIAGNOSIS — Z8673 Personal history of transient ischemic attack (TIA), and cerebral infarction without residual deficits: Secondary | ICD-10-CM | POA: Diagnosis not present

## 2022-09-16 DIAGNOSIS — H04123 Dry eye syndrome of bilateral lacrimal glands: Secondary | ICD-10-CM | POA: Diagnosis not present

## 2022-09-16 DIAGNOSIS — G4733 Obstructive sleep apnea (adult) (pediatric): Secondary | ICD-10-CM | POA: Diagnosis not present

## 2022-09-16 DIAGNOSIS — H2513 Age-related nuclear cataract, bilateral: Secondary | ICD-10-CM | POA: Diagnosis not present

## 2022-09-16 DIAGNOSIS — G8222 Paraplegia, incomplete: Secondary | ICD-10-CM | POA: Diagnosis not present

## 2022-09-16 DIAGNOSIS — H259 Unspecified age-related cataract: Secondary | ICD-10-CM | POA: Diagnosis not present

## 2022-09-16 DIAGNOSIS — I509 Heart failure, unspecified: Secondary | ICD-10-CM | POA: Diagnosis not present

## 2022-09-17 ENCOUNTER — Non-Acute Institutional Stay: Payer: Medicare Other | Admitting: Hospice

## 2022-09-17 DIAGNOSIS — B351 Tinea unguium: Secondary | ICD-10-CM | POA: Diagnosis not present

## 2022-09-17 DIAGNOSIS — L603 Nail dystrophy: Secondary | ICD-10-CM | POA: Diagnosis not present

## 2022-09-17 DIAGNOSIS — Z515 Encounter for palliative care: Secondary | ICD-10-CM

## 2022-09-17 DIAGNOSIS — F339 Major depressive disorder, recurrent, unspecified: Secondary | ICD-10-CM

## 2022-09-17 DIAGNOSIS — I5032 Chronic diastolic (congestive) heart failure: Secondary | ICD-10-CM | POA: Diagnosis not present

## 2022-09-17 DIAGNOSIS — I739 Peripheral vascular disease, unspecified: Secondary | ICD-10-CM | POA: Diagnosis not present

## 2022-09-17 DIAGNOSIS — G40909 Epilepsy, unspecified, not intractable, without status epilepticus: Secondary | ICD-10-CM | POA: Diagnosis not present

## 2022-09-17 NOTE — Progress Notes (Signed)
[facility] Consult Note Telephone: [phone]  Fax: [phone]  PATIENT NAME: James Villegas Shek DOB: April 02, 1956 MRN: [id number]  PRIMARY CARE PROVIDER:   Karna DupesBlake, Khashana A, MD Karna DupesBlake, Khashana A, MD [address] [place],  [place] [id number]  REFERRING PROVIDER: Karna DupesBlake, Khashana A, MD Karna DupesBlake, Khashana A, MD [address] [place],  [place] [id number]   RESPONSIBLE PARTY: Self/Sister [name]- Susan [phone] Contact Information     Name Relation Home Work Mobile   Rob HickmanHarvey,Susan Sister [phone]  [phone]       Visit is to build trust and highlight Palliative Medicine as specialized medical care for people living with serious illness, aimed at facilitating better quality of life through symptoms relief, assisting with advance care planning and complex medical decision making. This is a follow up visit.  CODE STATUS: Patient is a Do Not Resuscitate.   GOALS OF CARE: Goals of care include to maximize quality of life and symptom management.   MOST selections include comfort measures antibiotics if indicated, no IV fluids, no feeding tube.  Both signed DNR and MOST form uploaded to [facility] electronic medical record epic.  Visit consisted of counseling and education dealing with the complex and emotionally intense issues of symptom management and palliative care in the setting of serious and potentially life-threatening illness. Palliative care team will continue to support patient, patient's family, and medical team.   RECOMMENDATIONS/PLAN:   Symptom management/Plan:  Seizure disorder: No seizure since last visit. Continue Dilantin and Phenytoin as ordered. Seizure precautions.  Routine CBC CMP.  Dilantin level if needed.  CHF: Continue Torsemide as ordered. Fluid restriction 152800ml/24 hours; no added salt. Patient reports continues to cut back on soda, now two bottles a day, goal ia a soda a day.  No added salt,  Elevate BLE. Education on need to use oxygen as ordered. Ace wrap to right lower leg for edema.   Depression: Improved, continue with Venlafaxin.  Encourage socialization and participation in facility activities.  Psych following.  Follow up: Palliative care will continue to follow for complex medical decision making, advance care planning, and clarification of goals. Return 6 weeks or prn. Encouraged to call provider sooner with any concerns.  CHIEF COMPLAINT: Palliative follow up  HISTORY OF PRESENT ILLNESS:  James Villegas Doan a 67 y.o. 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  He denies depression, enjoys Konrad FelixElla Fitzgerald [employer] song book; reads science fiction, word search/puzzles, participates in facility [employer] and other activities. History obtained from review of EMR, discussion with primary team, family and/or patient. Records reviewed and summarized above. All 10 point systems reviewed and are negative except as documented in history of present illness above Review and summarization of Epic records shows history from other than patient.  Patient reports [facility] and wanted NP to explain it to his sister and answer questions.  NP called Rosalita ChessmanSuzanne and provided education on what cataract surgery is about; answered all her questions to her satisfaction. I reviewed, as needed, available labs, patient records, imaging, studies and related documents from the EMR.  Palliative Care was asked to follow this patient o help address complex decision making in the context of advance care  planning and goals of care clarification.    PERTINENT MEDICATIONS:  Outpatient Encounter  Medications as of 09/17/2022  Medication Sig   acetaminophen (TYLENOL) 325 MG tablet Take 650 mg by mouth every 6 (six) hours as needed for mild pain, headache or fever.   Carboxymeth-Glycerin-Polysorb (REFRESH OPTIVE ADVANCED) 0.5-1-0.5 % SOLN Place 1 drop into both eyes in the morning, at noon, in the evening, and at bedtime. Wait 3-5 minutes between eye medications   cycloSPORINE (RESTASIS) 0.05 % ophthalmic emulsion Place 1 drop into both eyes 2 (two) times daily.   empagliflozin (JARDIANCE) 10 MG TABS tablet Take by mouth daily.   ezetimibe (ZETIA) 10 MG tablet Take 10 mg by mouth daily.   losartan (COZAAR) 25 MG tablet Take 12.5 mg by mouth daily.   omeprazole (PRILOSEC) 20 MG capsule Take 20 mg by mouth daily.   OXYGEN Inhale into the lungs See admin instructions. Nightly 9p-8a   phenytoin (DILANTIN) 100 MG ER capsule Take 200 mg by mouth 2 (two) times daily.   phenytoin (DILANTIN) 50 MG tablet Chew 50 mg by mouth at bedtime. Give with 200mg  capsules for total dose of 250mg  qhs   potassium chloride SA (KLOR-CON) 20 MEQ tablet Take 2 tablets (40 mEq total) by mouth daily.   rosuvastatin (CRESTOR) 40 MG tablet Take 40 mg by mouth at bedtime.   spironolactone (ALDACTONE) 25 MG tablet Take 1 tablet (25 mg total) by mouth daily.   torsemide 40 MG TABS Take 40 mg by mouth 2 (two) times daily.   traZODone (DESYREL) 50 MG tablet Take 50 mg by mouth at bedtime.   venlafaxine XR (EFFEXOR-XR) 37.5 MG 24 hr capsule Take 37.5 mg by mouth at bedtime.   warfarin (COUMADIN) 2 MG tablet Take 2 mg by mouth daily. Take with 2.5mg  tablet for total dose of 4.5mg    warfarin (COUMADIN) 2.5 MG tablet Take 2.5 mg by mouth daily. Give with the 2mg  to equal 4.5mg    No facility-administered encounter medications on file as of 09/17/2022.    HOSPICE ELIGIBILITY/DIAGNOSIS: TBD  PAST MEDICAL HISTORY:  Past Medical History:  Diagnosis Date   Acute on chronic congestive heart failure (HCC)    Anemia, secondary     SECONDARY TO ACUTE BLOOD LOSS   Anxiety disorder    Bloody stool 08/29/2011   intermittent along with constipation.    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 IVC filter   Seizure disorder (HCC)      ALLERGIES:  Allergies  Allergen Reactions   Fenofibrate Other (See Comments)    Per MAR     I spent  35 minutes providing this consultation; this includes time spent with patient/family, chart review and documentation. More than 50% of the time in  this consultation was spent on counseling and coordinating communication  Thank you for the opportunity to participate in the care of CHON STANDARD Please call our office at [phone] if we can be of additional assistance.  Note: Portions of this note were generated with Scientist, clinical (histocompatibility and immunogenetics). Dictation errors may occur despite best attempts at proofreading.  Rosaura Carpenter, NP

## 2022-09-22 DIAGNOSIS — D6859 Other primary thrombophilia: Secondary | ICD-10-CM | POA: Diagnosis not present

## 2022-09-22 DIAGNOSIS — I482 Chronic atrial fibrillation, unspecified: Secondary | ICD-10-CM | POA: Diagnosis not present

## 2022-09-22 DIAGNOSIS — R791 Abnormal coagulation profile: Secondary | ICD-10-CM | POA: Diagnosis not present

## 2022-09-22 DIAGNOSIS — F39 Unspecified mood [affective] disorder: Secondary | ICD-10-CM | POA: Diagnosis not present

## 2022-09-22 DIAGNOSIS — K219 Gastro-esophageal reflux disease without esophagitis: Secondary | ICD-10-CM | POA: Diagnosis not present

## 2022-09-22 DIAGNOSIS — Z9189 Other specified personal risk factors, not elsewhere classified: Secondary | ICD-10-CM | POA: Diagnosis not present

## 2022-10-01 DIAGNOSIS — H2513 Age-related nuclear cataract, bilateral: Secondary | ICD-10-CM | POA: Diagnosis not present

## 2022-10-06 DIAGNOSIS — Z01818 Encounter for other preprocedural examination: Secondary | ICD-10-CM | POA: Diagnosis not present

## 2022-10-06 DIAGNOSIS — R0902 Hypoxemia: Secondary | ICD-10-CM | POA: Diagnosis not present

## 2022-10-06 DIAGNOSIS — I4891 Unspecified atrial fibrillation: Secondary | ICD-10-CM | POA: Diagnosis not present

## 2022-10-06 DIAGNOSIS — H2513 Age-related nuclear cataract, bilateral: Secondary | ICD-10-CM | POA: Diagnosis not present

## 2022-10-06 DIAGNOSIS — Z9981 Dependence on supplemental oxygen: Secondary | ICD-10-CM | POA: Diagnosis not present

## 2022-10-06 DIAGNOSIS — Z86718 Personal history of other venous thrombosis and embolism: Secondary | ICD-10-CM | POA: Diagnosis not present

## 2022-10-06 DIAGNOSIS — Z7901 Long term (current) use of anticoagulants: Secondary | ICD-10-CM | POA: Diagnosis not present

## 2022-10-07 DIAGNOSIS — F331 Major depressive disorder, recurrent, moderate: Secondary | ICD-10-CM | POA: Diagnosis not present

## 2022-10-07 DIAGNOSIS — F39 Unspecified mood [affective] disorder: Secondary | ICD-10-CM | POA: Diagnosis not present

## 2022-10-09 NOTE — Progress Notes (Addendum)
Office Visit    Patient Name: James Villegas Date of Encounter: 10/10/2022  PCP:  James Dupes, MD   [facility]  Cardiologist:  James Sprague, MD  Advanced Practice Provider:  No care team member to display Electrophysiologist:  None   HPI    James Villegas is a 67 y.o. male with a past medical history of persistent atrial fibrillation, CVA status post left MCA stroke 2010 with residual right hemiparesis, ascending aortic aneurysm, positive PFO (on Coumadin) with rectus sheath hematoma, HFpEF, hypercoagulable disorder with protein c and s deficiency, positive lupus presents today for follow-up visit.  The patient was previously seen in 2012 by Dr. Excell Villegas for discussion of PFO closure following left MCA stroke in 2010.  He was lost to follow-up until 2020 when he was seen by Dr. Delton Villegas during an admission for CHF exacerbation and atrial flutter which was new onset in the setting of hypercapnic respiratory failure.  2D echocardiogram was completed showing normal LV systolic function with mildly dilated LA.  He was started on IV Lasix with diuresis and plans to manage PFO medically.  He was seen 2/22 for CHF exacerbation.  He was admitted from [facility] due to altered mental status and hypoxia.  He was found to be hypoxic at 50% on room air and was treated with IV Lasix and DuoNebs with improvement.  Patient's BNP was elevated at 960 and he was diuresed 4.3 L and was placed in Unna boots.  2D echo was completed with EF 65 to 70% with no RWMA with severe eccentric LVH, normal RV, systolic function with moderately elevated pulmonary artery systolic pressures and moderately dilated ascending aortic aneurysm 47 mm.  Patient had improvement in breathing and was discharged on Demadex 20 mg twice a day and was scheduled for outpatient sleep study.  He was also scheduled for follow-up CT scan due to aortic aneurysm.  He was seen 01/23/2022 for overdue  follow-up and was recently seen by palliative care (10/18/2021) and was made a DNR.  During his follow-up patient had gained 10 pounds and was off of his metoprolol for reduction to his torsemide dose.  Currently on Jardiance 10 mg participating in OT for 1 hour a day.  Patient's furosemide was increased to 20 mg twice a day due to lower extremity swelling.  Sleep study was unable to be done due to body habitus.  Patient will require a Hoyer lift and was referred to [facility] for further management.  He presented 08/01/2022 for 8-month follow-up for CHF.  No new cardiac complaints at that time.  Blood pressure 112/62 and heart rate 52 bpm.  EKG was obtained and patient is in permanent atrial fibrillation well-controlled.  Tolerating medications.  Euvolemic with chronic lower extremity swelling.  Weight has been maintained.  He reported that his facility minimizes and moderate salt intake.  Has good urine output.  GDMT limited by bradycardia.  Sleep study was ordered.  Today, he shares with me that he suffered a massive stroke and he cannot use the whole right side of his body.  He lives at [facility] home.  He has not had any recent surveillance of his aorta so we have ordered a CTA today.  His power of attorney is James Villegas and his sister he has asked me to call her today reiterating the plan.  We have also planned to order some cholesterol labs and he can get these done at  this facility.  Otherwise, doing well today.  Heart rate and blood pressure are stable. No other cardiac concerns  Reports no shortness of breath nor dyspnea on exertion. Reports no chest pain, pressure, or tightness. No edema, orthopnea, PND. Reports no palpitations.   Past Medical History    Past Medical History:  Diagnosis Date   Acute on chronic congestive heart failure (HCC)    Anemia, secondary    SECONDARY TO ACUTE BLOOD LOSS   Anxiety disorder    Bloody stool 08/29/2011   intermittent along with constipation.    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 IVC filter   Seizure disorder [facility])    Past Surgical History:  Procedure Laterality Date   dental extraction     multiple   vena cavogram  12/2010   INFERIOR    Allergies  Allergies  Allergen Reactions   Fenofibrate Other (Villegas Comments)    Per MAR     EKGs/Labs/Other Studies Reviewed:   The following studies were reviewed today: Cardiac Studies & Procedures       ECHOCARDIOGRAM  ECHOCARDIOGRAM COMPLETE 09/16/2021  Narrative ECHOCARDIOGRAM REPORT    Patient Name:   James Villegas Date of Exam: 09/16/2021 Medical Rec #:  [id number]      Height:       67.0 in Accession #:    [id number]     Weight:       292.8 lb Date of Birth:  [birth year]     BSA:          2.378 m Patient Age:    65 years       BP:           132/73 mmHg Patient Gender: M              HR:           85 bpm. Exam Location:  Inpatient  Procedure: 2D Echo  Indications:    CHF  History:        Patient has prior history of Echocardiogram examinations, most recent 07/31/2020. CHF; Arrythmias:Atrial Fibrillation.  Sonographer:    James Villegas Referring Phys: [id number] James Villegas   Sonographer Comments: Image acquisition challenging due to respiratory motion and Image acquisition challenging due to patient body  habitus. IMPRESSIONS   1. Limited study due to poor echo windows. 2. Left ventricular ejection fraction, by estimation, is 70 to 75%. The left ventricle has hyperdynamic function. The left ventricle has no regional wall motion abnormalities. There is severe hypertrophy of the LV septum. The rest of the LV segments demonstrate moderate concentric left ventricular hypertrophy. Diastolic function indeterminant due to Afib. 3. Right ventricular systolic function is normal. The right ventricular size is not well visualized.  4. Left atrial size was moderately dilated. 5. The mitral valve is grossly normal. Trivial mitral valve regurgitation. 6. The aortic valve is tricuspid. There is mild calcification of the aortic valve. There is mild thickening of the aortic valve. Aortic valve regurgitation is mild. Aortic valve sclerosis/calcification is present, without any evidence of aortic stenosis. 7. Aortic dilatation noted. There is moderate dilatation of the aortic root, measuring 45 mm. There is moderate dilatation of the ascending aorta, measuring 45 mm.  Comparison(s): Compared to prior TTE in 07/2020, there is no significant change. Last TTE with ascending aorta meausring 47mm.  FINDINGS Left Ventricle: Left ventricular ejection fraction, by estimation, is 70 to 75%. The left ventricle has hyperdynamic function. The left ventricle has no regional wall motion abnormalities. The left ventricular internal cavity size was normal in size. There is severe hypertrophy of the LV septum. The rest of the LV segments demonstrate moderate concentric left ventricular hypertrophy. Diastolic function indeterminant due to Afib.  Right Ventricle: The right ventricular size is not well visualized. Right vetricular wall thickness was not well visualized. Right ventricular systolic function is normal.  Left Atrium: Left atrial size was moderately dilated.  Right Atrium: Right atrial size was not well  visualized.  Pericardium: There is no evidence of pericardial effusion.  Mitral Valve: The mitral valve is grossly normal. There is mild thickening of the mitral valve leaflet(s). Trivial mitral valve regurgitation.  Tricuspid Valve: The tricuspid valve is normal in structure. Tricuspid valve regurgitation is mild.  Aortic Valve: The aortic valve is tricuspid. There is mild calcification of the aortic valve. There is mild thickening of the aortic valve. Aortic valve regurgitation is mild. Aortic valve sclerosis/calcification is present, without any evidence of aortic stenosis. Aortic valve peak gradient measures 9.5 mmHg.  Pulmonic Valve: The pulmonic valve was normal in structure. Pulmonic valve regurgitation is trivial.  Aorta: Aortic dilatation noted. There is moderate dilatation of the aortic root, measuring 45 mm. There is moderate dilatation of the ascending aorta, measuring 45 mm.  IAS/Shunts: The atrial septum is grossly normal.   LEFT VENTRICLE PLAX 2D LVIDd:         5.60 cm LVIDs:         3.60 cm LV PW:         1.50 cm LV IVS:        2.00 cm LVOT diam:     2.30 cm LV SV:         106 LV SV Index:   44 LVOT Area:     4.15 cm   LEFT ATRIUM              Index LA diam:        5.00 cm  2.10 cm/m LA Vol (A2C):   96.8 ml  40.71 ml/m LA Vol (A4C):   95.7 ml  40.25 ml/m LA Biplane Vol: 100.0 ml 42.06 ml/m AORTIC VALVE                 PULMONIC VALVE AV Area (Vmax): 3.64 cm     PV Vmax:       1.22 m/s AV Vmax:        154.00 cm/s  PV Peak grad:  6.0 mmHg AV Peak Grad:   9.5 mmHg LVOT Vmax:      135.00 cm/s LVOT Vmean:     73.900 cm/s LVOT VTI:       0.254 m  AORTA Ao Root diam: 4.50 cm Ao Asc diam:  4.50 cm  TRICUSPID VALVE TR Peak grad:   44.4 mmHg TR Vmax:        333.00 cm/s  SHUNTS Systemic VTI:  0.25 m Systemic Diam: 2.30 cm  Laurance Flatten MD Electronically signed by Laurance Flatten MD Signature Date/Time: 09/16/2021/11:21:58 AM    Final               EKG:  EKG is not ordered today.   Recent Labs: 08/21/2022: ALT 15; TSH 1.550 08/26/2022: B Natriuretic Peptide 39.1; BUN 16; Creatinine, Ser 0.63; Hemoglobin 14.6; Magnesium 2.3; Platelets 228; Potassium 3.8; Sodium 139  Recent Lipid Panel    Component Value Date/Time   CHOL 207 (H) 07/15/2019 1055   TRIG 91 07/30/2020 1627   HDL 60 07/15/2019 1055   CHOLHDL 3.5 07/15/2019 1055   CHOLHDL 3.8 08/05/2018 0401   VLDL 27 08/05/2018 0401   LDLCALC 122 (H) 07/15/2019 1055    Home Medications   Current Meds  Medication Sig   acetaminophen (TYLENOL) 325 MG tablet Take 650 mg by mouth every 6 (six) hours as needed for mild pain, headache or fever.   Carboxymeth-Glycerin-Polysorb (REFRESH OPTIVE ADVANCED) 0.5-1-0.5 % SOLN Place 1 drop into both eyes in the morning, at noon, in the evening, and at bedtime. Wait 3-5 minutes between eye medications   cycloSPORINE (RESTASIS) 0.05 % ophthalmic emulsion Place 1 drop into both eyes 2 (two) times daily.   empagliflozin (JARDIANCE) 10 MG TABS tablet Take by mouth daily.   ezetimibe (ZETIA) 10 MG tablet Take 10 mg by mouth daily.   losartan (COZAAR) 25 MG tablet Take 12.5 mg by mouth daily.   omeprazole (PRILOSEC) 20 MG capsule Take 20 mg by mouth daily.   OXYGEN Inhale into the lungs Villegas admin instructions. Nightly 9p-8a   phenytoin (DILANTIN) 100 MG ER capsule Take 200 mg by mouth 2 (two) times daily.   phenytoin (DILANTIN) 50 MG tablet Chew 50 mg by mouth at bedtime. Give with 200mg  capsules for total dose of 250mg  qhs   potassium chloride SA (KLOR-CON) 20 MEQ tablet Take 2 tablets (40 mEq total) by mouth daily.   rosuvastatin (CRESTOR) 40 MG tablet Take 40 mg by mouth at bedtime.   spironolactone (ALDACTONE) 25 MG tablet Take 1 tablet (25 mg total) by mouth daily.   torsemide 40 MG TABS Take 40 mg by mouth 2 (two) times daily.   traZODone (DESYREL) 50 MG tablet Take 50 mg by mouth at bedtime.   venlafaxine XR (EFFEXOR-XR) 37.5 MG 24 hr  capsule Take 37.5 mg by mouth at bedtime.   warfarin (COUMADIN) 2 MG tablet Take 2 mg by mouth daily. Take with 2.5mg  tablet for total dose of 4.5mg    warfarin (COUMADIN) 2.5 MG tablet Take 2.5 mg by mouth daily. Give with the 2mg  to equal 4.5mg      Review of Systems      All other systems reviewed and are otherwise negative except as noted above.  Physical Exam    VS:  BP (!) 104/58 (BP Location: Left Arm, Patient Position: Sitting, Cuff Size: Normal)   Pulse (!) 51   Ht 6\' 1"  (1.854 m)   Wt 247 lb (112 kg)   BMI 32.59 kg/m  , BMI Body mass index is 32.59 kg/m.  Wt Readings from Last 3 Encounters:  10/10/22 247 lb (112 kg)  08/25/22 249 lb 12.8 oz (113.3 kg)  08/01/22 (!) 302 lb (137 kg)     GEN: Well nourished, well developed, in no  acute distress. HEENT: normal. Neck: Supple, no JVD, carotid bruits, or masses. Cardiac: RRR, no murmurs, rubs, or gallops. No clubbing, cyanosis, edema.  (Left sided edema from stroke) Radials/PT 2+ and equal bilaterally.  Respiratory:  Respirations regular and unlabored, clear to auscultation on the left, diminished lung sounds on the right. GI: Soft, nontender, nondistended. MS: No deformity or atrophy. Skin: Warm and dry, no rash. Neuro:  Strength and sensation are intact. Psych: Normal affect.  Assessment & Plan    HFpEF -2 bottle drinks, he drinks water  -sometimes he has some swelling on his right side (from his stroke) -Encouraged to continue medications including Jardiance 10 mg daily, that he 10 mg daily, losartan 12.5 g daily, potassium 40 once daily, Crestor milligrams daily, spironolactone 25 mg daily, furosemide 40 mg twice a day, Coumadin per Coumadin clinic  History of CVA -Right side paralyzed, feb 8th 2010, weakness was the only symptom and couldn't stand up, he got out of bed and got himself ready.  2006 blood clots in his lung.  -he does not think he was on a blood thinner. After 6 months took him out warfarin  -he used  to walk and ride a bike all around town but now requires a wheelchair  Ascending aortic aneurysm -CTA ordered today -4.2 cm back then -asked to keep a close watch on BP  Permanent atrial fibrillation -sinus bradycardia today -no symptoms at this time -Continue current medication regimen, he is on Coumadin for blood thinner  Obesity hypoventilation syndrome -stays on 4L of Oxygen and uses Bipap at night. -stable at this time      Disposition: Follow up 6 months with James Sprague, MD or APP.  Signed, [name], PA-C 10/10/2022, 1:22 PM [facility]

## 2022-10-10 ENCOUNTER — Ambulatory Visit: Payer: Medicare Other | Attending: Physician Assistant | Admitting: Physician Assistant

## 2022-10-10 ENCOUNTER — Non-Acute Institutional Stay: Payer: Medicare Other | Admitting: Hospice

## 2022-10-10 ENCOUNTER — Encounter: Payer: Self-pay | Admitting: Physician Assistant

## 2022-10-10 VITALS — BP 104/58 | HR 51 | Ht 73.0 in | Wt 247.0 lb

## 2022-10-10 DIAGNOSIS — G40909 Epilepsy, unspecified, not intractable, without status epilepticus: Secondary | ICD-10-CM | POA: Diagnosis not present

## 2022-10-10 DIAGNOSIS — I7121 Aneurysm of the ascending aorta, without rupture: Secondary | ICD-10-CM | POA: Insufficient documentation

## 2022-10-10 DIAGNOSIS — F339 Major depressive disorder, recurrent, unspecified: Secondary | ICD-10-CM

## 2022-10-10 DIAGNOSIS — Z515 Encounter for palliative care: Secondary | ICD-10-CM | POA: Diagnosis not present

## 2022-10-10 DIAGNOSIS — G479 Sleep disorder, unspecified: Secondary | ICD-10-CM | POA: Insufficient documentation

## 2022-10-10 DIAGNOSIS — E662 Morbid (severe) obesity with alveolar hypoventilation: Secondary | ICD-10-CM | POA: Diagnosis not present

## 2022-10-10 DIAGNOSIS — I5032 Chronic diastolic (congestive) heart failure: Secondary | ICD-10-CM | POA: Insufficient documentation

## 2022-10-10 DIAGNOSIS — I5030 Unspecified diastolic (congestive) heart failure: Secondary | ICD-10-CM | POA: Insufficient documentation

## 2022-10-10 DIAGNOSIS — I639 Cerebral infarction, unspecified: Secondary | ICD-10-CM | POA: Insufficient documentation

## 2022-10-10 DIAGNOSIS — I4821 Permanent atrial fibrillation: Secondary | ICD-10-CM | POA: Diagnosis not present

## 2022-10-10 NOTE — Patient Instructions (Addendum)
Medication Instructions:  Your physician recommends that you continue on your current medications as directed. Please refer to the Current Medication list given to you today.   *If you need a refill on your cardiac medications before your next appointment, please call your pharmacy*   Lab Work: LABS REQUESTED BY FACILITIES LIPID AND LIVER    If you have labs (blood work) drawn today and your tests are completely normal, you will receive your results only by: MyChart Message (if you have MyChart) OR A paper copy in the mail If you have any lab test that is abnormal or we need to change your treatment, we will call you to review the results.   Testing/Procedures: Non-Cardiac CT Angiography (CTA), is a special type of CT scan that uses a computer to produce multi-dimensional views of major blood vessels throughout the body. In CT angiography, a contrast material is injected through an IV to help visualize the blood vessels       Follow-Up: At [facility], you and your health needs are our priority.  As part of our continuing mission to provide you with exceptional heart care, we have created designated Provider Care Teams.  These Care Teams include your primary Cardiologist (physician) and Advanced Practice Providers (APPs -  Physician Assistants and Nurse Practitioners) who all work together to provide you with the care you need, when you need it.  We recommend signing up for the patient portal called "MyChart".  Sign up information is provided on this After Visit Summary.  MyChart is used to connect with patients for Virtual Visits (Telemedicine).  Patients are able to view lab/test results, encounter notes, upcoming appointments, etc.  Non-urgent messages can be sent to your provider as well.   To learn more about what you can do with MyChart, go to [email or web address].    Your next appointment:   6 month(s)  Provider:   Meriam Sprague, MD     Other  Instructions  Low-Sodium Eating Plan Sodium, which is an element that makes up salt, helps you maintain a healthy balance of fluids in your body. Too much sodium can increase your blood pressure and cause fluid and waste to be held in your body. Your health care provider or dietitian may recommend following this plan if you have high blood pressure (hypertension), kidney disease, liver disease, or heart failure. Eating less sodium can help lower your blood pressure, reduce swelling, and protect your heart, liver, and kidneys. What are tips for following this plan? Reading food labels The Nutrition Facts label lists the amount of sodium in one serving of the food. If you eat more than one serving, you must multiply the listed amount of sodium by the number of servings. Choose foods with less than 140 mg of sodium per serving. Avoid foods with 300 mg of sodium or more per serving. Shopping  Look for lower-sodium products, often labeled as "low-sodium" or "no salt added." Always check the sodium content, even if foods are labeled as "unsalted" or "no salt added." Buy fresh foods. Avoid canned foods and pre-made or frozen meals. Avoid canned, cured, or processed meats. Buy breads that have less than 80 mg of sodium per slice. Cooking  Eat more home-cooked food and less restaurant, buffet, and fast food. Avoid adding salt when cooking. Use salt-free seasonings or herbs instead of table salt or sea salt. Check with your health care provider or pharmacist before using salt substitutes. Cook with plant-based oils, such as canola, sunflower,  or olive oil. Meal planning When eating at a restaurant, ask that your food be prepared with less salt or no salt, if possible. Avoid dishes labeled as brined, pickled, cured, smoked, or made with soy sauce, miso, or teriyaki sauce. Avoid foods that contain MSG (monosodium glutamate). MSG is sometimes added to [place] food, bouillon, and some canned foods. Make  meals that can be grilled, baked, poached, roasted, or steamed. These are generally made with less sodium. General information Most people on this plan should limit their sodium intake to 1,500-2,000 mg (milligrams) of sodium each day. What foods should I eat? Fruits Fresh, frozen, or canned fruit. Fruit juice. Vegetables Fresh or frozen vegetables. "No salt added" canned vegetables. "No salt added" tomato sauce and paste. Low-sodium or reduced-sodium tomato and vegetable juice. Grains Low-sodium cereals, including oats, puffed wheat and rice, and shredded wheat. Low-sodium crackers. Unsalted rice. Unsalted pasta. Low-sodium bread. Whole-grain breads and whole-grain pasta. Meats and other proteins Fresh or frozen (no salt added) meat, poultry, seafood, and fish. Low-sodium canned tuna and salmon. Unsalted nuts. Dried peas, beans, and lentils without added salt. Unsalted canned beans. Eggs. Unsalted nut butters. Dairy Milk. Soy milk. Cheese that is naturally low in sodium, such as ricotta cheese, fresh mozzarella, or Swiss cheese. Low-sodium or reduced-sodium cheese. Cream cheese. Yogurt. Seasonings and condiments Fresh and dried herbs and spices. Salt-free seasonings. Low-sodium mustard and ketchup. Sodium-free salad dressing. Sodium-free light mayonnaise. Fresh or refrigerated horseradish. Lemon juice. Vinegar. Other foods Homemade, reduced-sodium, or low-sodium soups. Unsalted popcorn and pretzels. Low-salt or salt-free chips. The items listed above may not be a complete list of foods and beverages you can eat. Contact a dietitian for more information. What foods should I avoid? Vegetables Sauerkraut, pickled vegetables, and relishes. Olives. [place] fries. Onion rings. Regular canned vegetables (not low-sodium or reduced-sodium). Regular canned tomato sauce and paste (not low-sodium or reduced-sodium). Regular tomato and vegetable juice (not low-sodium or reduced-sodium). Frozen vegetables in  sauces. Grains Instant hot cereals. Bread stuffing, pancake, and biscuit mixes. Croutons. Seasoned rice or pasta mixes. Noodle soup cups. Boxed or frozen macaroni and cheese. Regular salted crackers. Self-rising flour. Meats and other proteins Meat or fish that is salted, canned, smoked, spiced, or pickled. Precooked or cured meat, such as sausages or meat loaves. Berniece Salines. Ham. Pepperoni. Hot dogs. Corned beef. Chipped beef. Salt pork. Jerky. Pickled herring. Anchovies and sardines. Regular canned tuna. Salted nuts. Dairy Processed cheese and cheese spreads. Hard cheeses. Cheese curds. Blue cheese. Feta cheese. String cheese. Regular cottage cheese. Buttermilk. Canned milk. Fats and oils Salted butter. Regular margarine. Ghee. Bacon fat. Seasonings and condiments Onion salt, garlic salt, seasoned salt, table salt, and sea salt. Canned and packaged gravies. Worcestershire sauce. Tartar sauce. Barbecue sauce. Teriyaki sauce. Soy sauce, including reduced-sodium. Steak sauce. Fish sauce. Oyster sauce. Cocktail sauce. Horseradish that you find on the shelf. Regular ketchup and mustard. Meat flavorings and tenderizers. Bouillon cubes. Hot sauce. Pre-made or packaged marinades. Pre-made or packaged taco seasonings. Relishes. Regular salad dressings. Salsa. Other foods Salted popcorn and pretzels. Corn chips and puffs. Potato and tortilla chips. Canned or dried soups. Pizza. Frozen entrees and pot pies. The items listed above may not be a complete list of foods and beverages you should avoid. Contact a dietitian for more information. Summary Eating less sodium can help lower your blood pressure, reduce swelling, and protect your heart, liver, and kidneys. Most people on this plan should limit their sodium intake to 1,500-2,000 mg (milligrams) of sodium  each day. Canned, boxed, and frozen foods are high in sodium. Restaurant foods, fast foods, and pizza are also very high in sodium. You also get sodium by  adding salt to food. Try to cook at home, eat more fresh fruits and vegetables, and eat less fast food and canned, processed, or prepared foods. This information is not intended to replace advice given to you by your health care provider. Make sure you discuss any questions you have with your health care provider. Document Revised: 05/02/2019 Document Reviewed: 04/27/2019 Elsevier Patient Education  2023 [employer].

## 2022-10-10 NOTE — Progress Notes (Signed)
Therapist, nutritional Palliative Care Consult Note Telephone: [phone]  Fax: [phone]  PATIENT NAME: James Villegas DOB: 02/21/1956 MRN: [id number]  PRIMARY CARE PROVIDER:   Karna Dupes, MD Karna Dupes, MD [address] [place],  [place] [address]  REFERRING PROVIDER: Karna Dupes, MD Karna Dupes, MD [address] [place],  [place] [address]   RESPONSIBLE PARTY: Self/Sister [name] [phone] Contact Information     Name Relation Home Work Mobile   Ples, Frager Sister [phone]  [phone]       Visit is to build trust and highlight Palliative Medicine as specialized medical care for people living with serious illness, aimed at facilitating better quality of life through symptoms relief, assisting with advance care planning and complex medical decision making. This is a follow up visit. Patient was getting ready to go for his appointment with cardiologist.  CODE STATUS: Patient is a Do Not Resuscitate.   GOALS OF CARE: Goals of care include to maximize quality of life and symptom management.   MOST selections include comfort measures antibiotics if indicated, no IV fluids, no feeding tube.  Both signed DNR and MOST form uploaded to [facility] health electronic medical record epic.  Visit consisted of counseling and education dealing with the complex and emotionally intense issues of symptom management and palliative care in the setting of serious and potentially life-threatening illness. Palliative care team will continue to support patient, patient's family, and medical team.   RECOMMENDATIONS/PLAN:   Symptom management/Plan:   CHF: Patient  reported he has lost weight and his edema has gone down significantly.  Current weight 275 Ibs $/2024 302  Ibs 08/2022.  Follow up Cardiologist today. Currently on  Torsemide,  Fluid restriction 1569ml/24 hours; no added salt. Patient reports continues to cut back  on soda.  No added salt, Elevate BLE. Education on need to use oxygen as ordered. Ace wrap to right lower leg for edema.   Seizure disorder: No seizure since last visit. Continue Dilantin and Phenytoin as ordered. Seizure precautions.  Routine CBC CMP.  Dilantin level if needed.  Depression: Improved, continue with Venlafaxin.  Encourage socialization and participation in facility activities.  Psych following.  Follow up: Palliative care will continue to follow for complex medical decision making, advance care planning, and clarification of goals. Return 6 weeks or prn. Encouraged to call provider sooner with any concerns.  CHIEF COMPLAINT: Palliative follow up  HISTORY OF PRESENT ILLNESS:  James Villegas a 67 y.o. 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  He denies depression, enjoys Konrad [employer] book; reads science fiction, word search/puzzles, participates in facility [place] and other activities.  He said he will be good for follow-up appointment today with his cardiologist and that his cataract eye surgery is planned for the near future.  History obtained from review of EMR, discussion with primary team, family and/or patient. Records reviewed and summarized above. All 10 point systems reviewed and are negative except as documented in history of present illness above Review and summarization of Epic records shows history from other than patient.  Patient had reported the plan to go for cataract eye surgery at [facility]  I reviewed, as needed, available labs, patient records,  imaging, studies and related documents from the EMR.  Palliative Care was asked to follow this patient o help address  complex decision making in the context of advance care planning and goals of care clarification.    PERTINENT MEDICATIONS:  Outpatient Encounter Medications as of 10/10/2022  Medication Sig   acetaminophen (TYLENOL) 325 MG tablet Take 650 mg by mouth every 6 (six) hours as needed for mild pain, headache or fever.   Carboxymeth-Glycerin-Polysorb (REFRESH OPTIVE ADVANCED) 0.5-1-0.5 % SOLN Place 1 drop into both eyes in the morning, at noon, in the evening, and at bedtime. Wait 3-5 minutes between eye medications   cycloSPORINE (RESTASIS) 0.05 % ophthalmic emulsion Place 1 drop into both eyes 2 (two) times daily.   empagliflozin (JARDIANCE) 10 MG TABS tablet Take by mouth daily.   ezetimibe (ZETIA) 10 MG tablet Take 10 mg by mouth daily.   losartan (COZAAR) 25 MG tablet Take 12.5 mg by mouth daily.   omeprazole (PRILOSEC) 20 MG capsule Take 20 mg by mouth daily.   OXYGEN Inhale into the lungs See admin instructions. Nightly 9p-8a   phenytoin (DILANTIN) 100 MG ER capsule Take 200 mg by mouth 2 (two) times daily.   phenytoin (DILANTIN) 50 MG tablet Chew 50 mg by mouth at bedtime. Give with 200mg  capsules for total dose of 250mg  qhs   potassium chloride SA (KLOR-CON) 20 MEQ tablet Take 2 tablets (40 mEq total) by mouth daily.   rosuvastatin (CRESTOR) 40 MG tablet Take 40 mg by mouth at bedtime.   spironolactone (ALDACTONE) 25 MG tablet Take 1 tablet (25 mg total) by mouth daily.   torsemide 40 MG TABS Take 40 mg by mouth 2 (two) times daily.   traZODone (DESYREL) 50 MG tablet Take 50 mg by mouth at bedtime.   venlafaxine XR (EFFEXOR-XR) 37.5 MG 24 hr capsule Take 37.5 mg by mouth at bedtime.   warfarin (COUMADIN) 2 MG tablet Take 2 mg by mouth daily. Take with 2.5mg  tablet for total dose of 4.5mg    warfarin (COUMADIN) 2.5 MG tablet Take 2.5 mg by mouth daily. Give with the 2mg  to equal 4.5mg    No facility-administered encounter medications on file as of 10/10/2022.    HOSPICE ELIGIBILITY/DIAGNOSIS:  TBD  PAST MEDICAL HISTORY:  Past Medical History:  Diagnosis Date   Acute on chronic congestive heart failure (HCC)    Anemia, secondary    SECONDARY TO ACUTE BLOOD LOSS   Anxiety disorder    Bloody stool 08/29/2011   intermittent along with constipation.    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 IVC filter   Seizure disorder (HCC)      ALLERGIES:  Allergies  Allergen Reactions   Fenofibrate Other (See Comments)    Per [facility]  I spent  35 minutes providing this consultation; this includes time spent with patient/family, chart review and documentation. More than 50% of the time in this consultation was spent on counseling and coordinating communication  Thank you for the opportunity to participate in the care of EDUAR COUZENS Please call our office at [phone] if we can be of additional assistance.  Note: Portions of this note were generated with Scientist, clinical (histocompatibility and immunogenetics). Dictation errors may occur despite best attempts at proofreading.  Rosaura Carpenter, NP

## 2022-10-13 DIAGNOSIS — H269 Unspecified cataract: Secondary | ICD-10-CM | POA: Diagnosis not present

## 2022-10-13 DIAGNOSIS — H259 Unspecified age-related cataract: Secondary | ICD-10-CM | POA: Diagnosis not present

## 2022-10-13 DIAGNOSIS — H2513 Age-related nuclear cataract, bilateral: Secondary | ICD-10-CM | POA: Diagnosis not present

## 2022-10-14 DIAGNOSIS — H04123 Dry eye syndrome of bilateral lacrimal glands: Secondary | ICD-10-CM | POA: Diagnosis not present

## 2022-10-14 DIAGNOSIS — Z9842 Cataract extraction status, left eye: Secondary | ICD-10-CM | POA: Diagnosis not present

## 2022-10-14 DIAGNOSIS — Z48810 Encounter for surgical aftercare following surgery on the sense organs: Secondary | ICD-10-CM | POA: Diagnosis not present

## 2022-10-14 DIAGNOSIS — Z9841 Cataract extraction status, right eye: Secondary | ICD-10-CM | POA: Diagnosis not present

## 2022-10-20 DIAGNOSIS — G40909 Epilepsy, unspecified, not intractable, without status epilepticus: Secondary | ICD-10-CM | POA: Diagnosis not present

## 2022-10-20 DIAGNOSIS — I482 Chronic atrial fibrillation, unspecified: Secondary | ICD-10-CM | POA: Diagnosis not present

## 2022-10-20 DIAGNOSIS — J9612 Chronic respiratory failure with hypercapnia: Secondary | ICD-10-CM | POA: Diagnosis not present

## 2022-10-20 DIAGNOSIS — Z7901 Long term (current) use of anticoagulants: Secondary | ICD-10-CM | POA: Diagnosis not present

## 2022-10-20 DIAGNOSIS — R634 Abnormal weight loss: Secondary | ICD-10-CM | POA: Diagnosis not present

## 2022-10-20 DIAGNOSIS — I69351 Hemiplegia and hemiparesis following cerebral infarction affecting right dominant side: Secondary | ICD-10-CM | POA: Diagnosis not present

## 2022-10-20 DIAGNOSIS — D6859 Other primary thrombophilia: Secondary | ICD-10-CM | POA: Diagnosis not present

## 2022-10-20 DIAGNOSIS — Z9849 Cataract extraction status, unspecified eye: Secondary | ICD-10-CM | POA: Diagnosis not present

## 2022-10-20 DIAGNOSIS — Z6834 Body mass index (BMI) 34.0-34.9, adult: Secondary | ICD-10-CM | POA: Diagnosis not present

## 2022-10-20 DIAGNOSIS — F39 Unspecified mood [affective] disorder: Secondary | ICD-10-CM | POA: Diagnosis not present

## 2022-10-20 DIAGNOSIS — I5032 Chronic diastolic (congestive) heart failure: Secondary | ICD-10-CM | POA: Diagnosis not present

## 2022-10-20 DIAGNOSIS — E662 Morbid (severe) obesity with alveolar hypoventilation: Secondary | ICD-10-CM | POA: Diagnosis not present

## 2022-10-20 DIAGNOSIS — Z9981 Dependence on supplemental oxygen: Secondary | ICD-10-CM | POA: Diagnosis not present

## 2022-10-21 DIAGNOSIS — Z9841 Cataract extraction status, right eye: Secondary | ICD-10-CM | POA: Diagnosis not present

## 2022-10-21 DIAGNOSIS — H04123 Dry eye syndrome of bilateral lacrimal glands: Secondary | ICD-10-CM | POA: Diagnosis not present

## 2022-10-21 DIAGNOSIS — Z79899 Other long term (current) drug therapy: Secondary | ICD-10-CM | POA: Diagnosis not present

## 2022-10-21 DIAGNOSIS — Z9842 Cataract extraction status, left eye: Secondary | ICD-10-CM | POA: Diagnosis not present

## 2022-10-21 DIAGNOSIS — Z48810 Encounter for surgical aftercare following surgery on the sense organs: Secondary | ICD-10-CM | POA: Diagnosis not present

## 2022-10-24 ENCOUNTER — Non-Acute Institutional Stay: Payer: Medicare Other | Admitting: Hospice

## 2022-10-24 DIAGNOSIS — Z515 Encounter for palliative care: Secondary | ICD-10-CM | POA: Diagnosis not present

## 2022-10-24 DIAGNOSIS — I5032 Chronic diastolic (congestive) heart failure: Secondary | ICD-10-CM

## 2022-10-24 DIAGNOSIS — G40909 Epilepsy, unspecified, not intractable, without status epilepticus: Secondary | ICD-10-CM

## 2022-10-24 DIAGNOSIS — R051 Acute cough: Secondary | ICD-10-CM | POA: Diagnosis not present

## 2022-10-24 DIAGNOSIS — F339 Major depressive disorder, recurrent, unspecified: Secondary | ICD-10-CM | POA: Diagnosis not present

## 2022-10-24 NOTE — Progress Notes (Signed)
Therapist, nutritional Palliative Care Consult Note Telephone: [phone]  Fax: [phone]  PATIENT NAME: James Villegas DOB: Feb 06, 1956 MRN: [id number]  PRIMARY CARE PROVIDER:   Karna Dupes, MD Karna Dupes, MD [address] [place],  [place] [address]  REFERRING PROVIDER: Karna Dupes, MD Karna Dupes, MD [address] [place],  [place] [address]   RESPONSIBLE PARTY: Self/Sister [name] [phone] Contact Information     Name Relation Home Work Mobile   Luiscarlos, Fawcett Sister [phone]  [phone]       Visit is to build trust and highlight Palliative Medicine as specialized medical care for people living with serious illness, aimed at facilitating better quality of life through symptoms relief, assisting with advance care planning and complex medical decision making. This is a follow up visit.  CODE STATUS: Patient is a Do Not Resuscitate.   GOALS OF CARE: Goals of care include to maximize quality of life and symptom management.   MOST selections include comfort measures antibiotics if indicated, no IV fluids, no feeding tube.  Both signed DNR and MOST form uploaded to [facility] health electronic medical record epic.  Visit consisted of counseling and education dealing with the complex and emotionally intense issues of symptom management and palliative care in the setting of serious and potentially life-threatening illness. Palliative care team will continue to support patient, patient's family, and medical team.   RECOMMENDATIONS/PLAN:   Symptom management/Plan:  Cough: acute in the past 4 days, with clear phlegm, afebril, non toxic appearing.  Recommendation: Mucinex 600 mg BID x 5 days. Monitor VS TID x 5 days. Obtain Chest Xray, CBC BMP if continuing or worsening.  Nurse Lynnae Sandhoff to call PCP with orders.    CHF: Patient has lost weight and his edema has gone down significantly.  Current weight  268 Ibs  10/22/22 275 Ibs $/2024 302  Ibs 08/2022.   Currently on  Torsemide,  Fluid restriction 1540ml/24 hours; no added salt. Patient reports continues to cut back on soda.  No added salt, Elevate BLE. Education on need to use oxygen as ordered. Ace wrap to right lower leg for edema. Follow up Cardiologist as planned.   Seizure disorder: No seizure since last visit. Continue Dilantin and Phenytoin as ordered. Seizure precautions.  Routine CBC CMP.  Dilantin level if needed.  Depression: Improved, continue with Venlafaxin.  Encourage socialization and participation in facility activities.  Psych following.  Follow up: Palliative care will continue to follow for complex medical decision making, advance care planning, and clarification of goals. Return 6 weeks or prn. Encouraged to call provider sooner with any concerns.  CHIEF COMPLAINT: Palliative follow up  HISTORY OF PRESENT ILLNESS:  James Villegas a 67 y.o. 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  He reports occasional cough with clear phlegm, denies fever/chills, no pain/discomfort. History obtained from review of EMR, discussion with primary team, family and/or patient. Records reviewed and summarized above. All 10 point systems reviewed and are negative except as documented in history of present illness above Review and summarization of Epic records shows history from other than patient.  Patient had reported the plan to go for cataract eye surgery at [facility]  I reviewed, as needed, available labs, patient  records, imaging, studies and related documents from the EMR.  Palliative Care was asked to follow this patient o help address complex decision making in the  context of advance care planning and goals of care clarification.   PHYSICAL EXAM: GEN: in no acute distress  Cardiac: S1 S2, trace edema in BLE Respiratory: occasional cough with clear phlegm x 4 days, no wheezing, no adventitious lung sounds.  GI: soft, nontender, nondistended, + BS   MS: gets around with his scooter    PERTINENT MEDICATIONS:  Outpatient Encounter Medications as of 10/24/2022  Medication Sig   acetaminophen (TYLENOL) 325 MG tablet Take 650 mg by mouth every 6 (six) hours as needed for mild pain, headache or fever.   Carboxymeth-Glycerin-Polysorb (REFRESH OPTIVE ADVANCED) 0.5-1-0.5 % SOLN Place 1 drop into both eyes in the morning, at noon, in the evening, and at bedtime. Wait 3-5 minutes between eye medications   cycloSPORINE (RESTASIS) 0.05 % ophthalmic emulsion Place 1 drop into both eyes 2 (two) times daily.   empagliflozin (JARDIANCE) 10 MG TABS tablet Take by mouth daily.   ezetimibe (ZETIA) 10 MG tablet Take 10 mg by mouth daily.   losartan (COZAAR) 25 MG tablet Take 12.5 mg by mouth daily.   omeprazole (PRILOSEC) 20 MG capsule Take 20 mg by mouth daily.   OXYGEN Inhale into the lungs See admin instructions. Nightly 9p-8a   phenytoin (DILANTIN) 100 MG ER capsule Take 200 mg by mouth 2 (two) times daily.   phenytoin (DILANTIN) 50 MG tablet Chew 50 mg by mouth at bedtime. Give with 200mg  capsules for total dose of 250mg  qhs   potassium chloride SA (KLOR-CON) 20 MEQ tablet Take 2 tablets (40 mEq total) by mouth daily.   rosuvastatin (CRESTOR) 40 MG tablet Take 40 mg by mouth at bedtime.   spironolactone (ALDACTONE) 25 MG tablet Take 1 tablet (25 mg total) by mouth daily.   torsemide 40 MG TABS Take 40 mg by mouth 2 (two) times daily.   traZODone (DESYREL) 50 MG tablet Take 50 mg by mouth at bedtime.   venlafaxine XR (EFFEXOR-XR) 37.5 MG 24 hr capsule Take 37.5 mg by mouth at bedtime.   warfarin (COUMADIN) 2 MG tablet Take 2 mg by mouth daily. Take with 2.5mg  tablet  for total dose of 4.5mg    warfarin (COUMADIN) 2.5 MG tablet Take 2.5 mg by mouth daily. Give with the 2mg  to equal 4.5mg    No facility-administered encounter medications on file as of 10/24/2022.    HOSPICE ELIGIBILITY/DIAGNOSIS: TBD  PAST MEDICAL HISTORY:  Past Medical History:  Diagnosis Date   Acute on chronic congestive heart failure (HCC)    Anemia, secondary    SECONDARY TO ACUTE BLOOD LOSS   Anxiety disorder    Bloody stool 08/29/2011   intermittent along with constipation.    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 IVC filter   Seizure disorder (HCC)      ALLERGIES:  Allergies  Allergen Reactions   Fenofibrate Other (See Comments)    Per [facility]     I spent  35 minutes providing this consultation; this includes time spent with patient/family, chart review and documentation. More than 50% of the time in this consultation was spent on counseling and coordinating communication.  Thank you for the opportunity to participate in the care of KELVYN TOELLE Please call our office at [phone] if we can be of additional assistance.  Note: Portions of this note were generated with Scientist, clinical (histocompatibility and immunogenetics). Dictation errors may occur despite best attempts at proofreading.  Rosaura Carpenter, NP

## 2022-11-11 DIAGNOSIS — F331 Major depressive disorder, recurrent, moderate: Secondary | ICD-10-CM | POA: Diagnosis not present

## 2022-11-11 DIAGNOSIS — F39 Unspecified mood [affective] disorder: Secondary | ICD-10-CM | POA: Diagnosis not present

## 2022-11-19 ENCOUNTER — Non-Acute Institutional Stay: Payer: Medicare Other | Admitting: Hospice

## 2022-11-19 DIAGNOSIS — F339 Major depressive disorder, recurrent, unspecified: Secondary | ICD-10-CM

## 2022-11-19 DIAGNOSIS — I5032 Chronic diastolic (congestive) heart failure: Secondary | ICD-10-CM | POA: Diagnosis not present

## 2022-11-19 DIAGNOSIS — Z9981 Dependence on supplemental oxygen: Secondary | ICD-10-CM | POA: Diagnosis not present

## 2022-11-19 DIAGNOSIS — G40909 Epilepsy, unspecified, not intractable, without status epilepticus: Secondary | ICD-10-CM | POA: Diagnosis not present

## 2022-11-19 DIAGNOSIS — Z515 Encounter for palliative care: Secondary | ICD-10-CM | POA: Diagnosis not present

## 2022-11-19 DIAGNOSIS — I69351 Hemiplegia and hemiparesis following cerebral infarction affecting right dominant side: Secondary | ICD-10-CM | POA: Diagnosis not present

## 2022-11-19 DIAGNOSIS — K219 Gastro-esophageal reflux disease without esophagitis: Secondary | ICD-10-CM | POA: Diagnosis not present

## 2022-11-19 DIAGNOSIS — J9612 Chronic respiratory failure with hypercapnia: Secondary | ICD-10-CM | POA: Diagnosis not present

## 2022-11-19 DIAGNOSIS — I482 Chronic atrial fibrillation, unspecified: Secondary | ICD-10-CM | POA: Diagnosis not present

## 2022-11-19 NOTE — Progress Notes (Signed)
Therapist, nutritional Palliative Care Consult Note Telephone: [phone]  Fax: [phone]  PATIENT NAME: James Villegas DOB: 20-Apr-1956 MRN: [id number]  PRIMARY CARE PROVIDER:   Karna Dupes, MD James Dupes, MD [address] [place],  [place] [address]  REFERRING PROVIDER: Karna Dupes, MD James Dupes, MD [address] [place],  [place] [address]   RESPONSIBLE PARTY: Self/Sister James Villegas [phone] Contact Information     Name Relation Home Work Mobile   James Villegas Sister [phone]  [phone]       Visit is to build trust and highlight Palliative Medicine as specialized medical care for people living with serious illness, aimed at facilitating better quality of life through symptoms relief, assisting with advance care planning and complex medical decision making. This is a follow up visit.  CODE STATUS: Patient is a Do Not Resuscitate.   GOALS OF CARE: Goals of care include to maximize quality of life and symptom management.   MOST selections include comfort measures antibiotics if indicated, no IV fluids, no feeding tube.  Both signed DNR and MOST form uploaded to [facility] health electronic medical record epic.  Visit consisted of counseling and education dealing with the complex and emotionally intense issues of symptom management and palliative care in the setting of serious and potentially life-threatening illness. Palliative care team will continue to support patient, patient's family, and medical team.   RECOMMENDATIONS/PLAN:   Symptom management/Plan:  Cough: resolved.  CHF: Continue Torsemide as ordered,  Fluid restriction 1549ml/24 hours; no added salt. Patient reports continues to cut back on soda.  No added salt, Elevate BLE. Education on need to use oxygen as ordered. Ace wrap to right lower leg for edema. Monitor weight closely per facility protocol; report weight gain of 3 Ibs in a day or 5  Ibs in a week.  268 Ibs 10/22/22 275 Ibs 09/2022 302  Ibs 08/2022.   Follow up Cardiologist as planned.   Seizure disorder: No seizure since last visit. Continue Dilantin and Phenytoin as ordered.  Phenytoin level 8.4 08/29/22 Maintain Seizure precautions.   Routine CBC CMP Phenytoin level  Depression: recurrent;  continue with Venlafaxin.  Encourage socialization and participation in facility activities.  Psych following.  Follow up: Palliative care will continue to follow for complex medical decision making, advance care planning, and clarification of goals. Return 6 weeks or prn. Encouraged to call provider sooner with any concerns.  CHIEF COMPLAINT: Palliative follow up  HISTORY OF PRESENT ILLNESS:  James Villegas a 67 y.o. 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  He reports occasional cough with clear phlegm, denies fever/chills, no pain/discomfort. History obtained from review of EMR, discussion with primary team, family and/or patient. Records reviewed and summarized above. All 10 point systems reviewed and are negative except as documented in history of present illness above Review and summarization of Epic records shows history from other than patient.  Patient had reported the plan to go for cataract eye surgery at [facility]  I reviewed, as needed, available labs, patient records, imaging, studies and related documents from the EMR.  Palliative Care was asked to follow this patient o help address complex decision making in the context of advance care planning and goals of  care clarification.   PHYSICAL EXAM: GEN: in no acute distress  Cardiac: S1 S2, trace edema in BLE Respiratory: occasional cough  with clear phlegm x 4 days, no wheezing, no adventitious lung sounds.  GI: soft, nontender, nondistended, + BS   MS: gets around with his scooter    PERTINENT MEDICATIONS:  Outpatient Encounter Medications as of 11/19/2022  Medication Sig   acetaminophen (TYLENOL) 325 MG tablet Take 650 mg by mouth every 6 (six) hours as needed for mild pain, headache or fever.   Carboxymeth-Glycerin-Polysorb (REFRESH OPTIVE ADVANCED) 0.5-1-0.5 % SOLN Place 1 drop into both eyes in the morning, at noon, in the evening, and at bedtime. Wait 3-5 minutes between eye medications   cycloSPORINE (RESTASIS) 0.05 % ophthalmic emulsion Place 1 drop into both eyes 2 (two) times daily.   empagliflozin (JARDIANCE) 10 MG TABS tablet Take by mouth daily.   ezetimibe (ZETIA) 10 MG tablet Take 10 mg by mouth daily.   losartan (COZAAR) 25 MG tablet Take 12.5 mg by mouth daily.   omeprazole (PRILOSEC) 20 MG capsule Take 20 mg by mouth daily.   OXYGEN Inhale into the lungs See admin instructions. Nightly 9p-8a   phenytoin (DILANTIN) 100 MG ER capsule Take 200 mg by mouth 2 (two) times daily.   phenytoin (DILANTIN) 50 MG tablet Chew 50 mg by mouth at bedtime. Give with 200mg  capsules for total dose of 250mg  qhs   potassium chloride SA (KLOR-CON) 20 MEQ tablet Take 2 tablets (40 mEq total) by mouth daily.   rosuvastatin (CRESTOR) 40 MG tablet Take 40 mg by mouth at bedtime.   spironolactone (ALDACTONE) 25 MG tablet Take 1 tablet (25 mg total) by mouth daily.   torsemide 40 MG TABS Take 40 mg by mouth 2 (two) times daily.   traZODone (DESYREL) 50 MG tablet Take 50 mg by mouth at bedtime.   venlafaxine XR (EFFEXOR-XR) 37.5 MG 24 hr capsule Take 37.5 mg by mouth at bedtime.   warfarin (COUMADIN) 2 MG tablet Take 2 mg by mouth daily. Take with 2.5mg  tablet for total dose of 4.5mg    warfarin (COUMADIN) 2.5 MG tablet Take 2.5 mg by mouth daily. Give with the 2mg  to equal 4.5mg    No facility-administered encounter medications on file  as of 11/19/2022.    HOSPICE ELIGIBILITY/DIAGNOSIS: TBD  PAST MEDICAL HISTORY:  Past Medical History:  Diagnosis Date   Acute on chronic congestive heart failure (HCC)    Anemia, secondary    SECONDARY TO ACUTE BLOOD LOSS   Anxiety disorder    Bloody stool 08/29/2011   intermittent along with constipation.    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 IVC filter   Seizure disorder (HCC)      ALLERGIES:  Allergies  Allergen Reactions   Fenofibrate Other (See Comments)    Per MAR     I spent  35 minutes providing this consultation; this includes time spent with patient/family, chart review and documentation. More than 50% of the time in this consultation was spent on counseling and coordinating communication.  Thank you for the opportunity to participate in the care of LILIANA BRENTLINGER Please call our office at [phone] if we can be of additional assistance.  Note: Portions of this note were generated with Scientist, clinical (histocompatibility and immunogenetics). Dictation errors may occur despite best attempts at proofreading.  Rosaura Carpenter, NP

## 2022-11-24 DIAGNOSIS — B351 Tinea unguium: Secondary | ICD-10-CM | POA: Diagnosis not present

## 2022-11-24 DIAGNOSIS — I739 Peripheral vascular disease, unspecified: Secondary | ICD-10-CM | POA: Diagnosis not present

## 2022-12-12 DIAGNOSIS — M6281 Muscle weakness (generalized): Secondary | ICD-10-CM | POA: Diagnosis not present

## 2022-12-12 DIAGNOSIS — F39 Unspecified mood [affective] disorder: Secondary | ICD-10-CM | POA: Diagnosis not present

## 2022-12-12 DIAGNOSIS — Z9981 Dependence on supplemental oxygen: Secondary | ICD-10-CM | POA: Diagnosis not present

## 2022-12-12 DIAGNOSIS — R279 Unspecified lack of coordination: Secondary | ICD-10-CM | POA: Diagnosis not present

## 2022-12-12 DIAGNOSIS — I69351 Hemiplegia and hemiparesis following cerebral infarction affecting right dominant side: Secondary | ICD-10-CM | POA: Diagnosis not present

## 2022-12-15 DIAGNOSIS — R279 Unspecified lack of coordination: Secondary | ICD-10-CM | POA: Diagnosis not present

## 2022-12-15 DIAGNOSIS — M6281 Muscle weakness (generalized): Secondary | ICD-10-CM | POA: Diagnosis not present

## 2022-12-15 DIAGNOSIS — I69351 Hemiplegia and hemiparesis following cerebral infarction affecting right dominant side: Secondary | ICD-10-CM | POA: Diagnosis not present

## 2022-12-15 DIAGNOSIS — Z9981 Dependence on supplemental oxygen: Secondary | ICD-10-CM | POA: Diagnosis not present

## 2022-12-15 DIAGNOSIS — F39 Unspecified mood [affective] disorder: Secondary | ICD-10-CM | POA: Diagnosis not present

## 2022-12-16 DIAGNOSIS — M6281 Muscle weakness (generalized): Secondary | ICD-10-CM | POA: Diagnosis not present

## 2022-12-16 DIAGNOSIS — Z9981 Dependence on supplemental oxygen: Secondary | ICD-10-CM | POA: Diagnosis not present

## 2022-12-16 DIAGNOSIS — R279 Unspecified lack of coordination: Secondary | ICD-10-CM | POA: Diagnosis not present

## 2022-12-16 DIAGNOSIS — I69351 Hemiplegia and hemiparesis following cerebral infarction affecting right dominant side: Secondary | ICD-10-CM | POA: Diagnosis not present

## 2022-12-16 DIAGNOSIS — F39 Unspecified mood [affective] disorder: Secondary | ICD-10-CM | POA: Diagnosis not present

## 2022-12-17 DIAGNOSIS — R279 Unspecified lack of coordination: Secondary | ICD-10-CM | POA: Diagnosis not present

## 2022-12-17 DIAGNOSIS — F39 Unspecified mood [affective] disorder: Secondary | ICD-10-CM | POA: Diagnosis not present

## 2022-12-17 DIAGNOSIS — I69351 Hemiplegia and hemiparesis following cerebral infarction affecting right dominant side: Secondary | ICD-10-CM | POA: Diagnosis not present

## 2022-12-17 DIAGNOSIS — M6281 Muscle weakness (generalized): Secondary | ICD-10-CM | POA: Diagnosis not present

## 2022-12-17 DIAGNOSIS — Z9981 Dependence on supplemental oxygen: Secondary | ICD-10-CM | POA: Diagnosis not present

## 2022-12-18 DIAGNOSIS — F39 Unspecified mood [affective] disorder: Secondary | ICD-10-CM | POA: Diagnosis not present

## 2022-12-18 DIAGNOSIS — Z9981 Dependence on supplemental oxygen: Secondary | ICD-10-CM | POA: Diagnosis not present

## 2022-12-18 DIAGNOSIS — I69351 Hemiplegia and hemiparesis following cerebral infarction affecting right dominant side: Secondary | ICD-10-CM | POA: Diagnosis not present

## 2022-12-18 DIAGNOSIS — R279 Unspecified lack of coordination: Secondary | ICD-10-CM | POA: Diagnosis not present

## 2022-12-18 DIAGNOSIS — M6281 Muscle weakness (generalized): Secondary | ICD-10-CM | POA: Diagnosis not present

## 2022-12-19 DIAGNOSIS — R279 Unspecified lack of coordination: Secondary | ICD-10-CM | POA: Diagnosis not present

## 2022-12-19 DIAGNOSIS — Z9981 Dependence on supplemental oxygen: Secondary | ICD-10-CM | POA: Diagnosis not present

## 2022-12-19 DIAGNOSIS — M6281 Muscle weakness (generalized): Secondary | ICD-10-CM | POA: Diagnosis not present

## 2022-12-19 DIAGNOSIS — I69351 Hemiplegia and hemiparesis following cerebral infarction affecting right dominant side: Secondary | ICD-10-CM | POA: Diagnosis not present

## 2022-12-19 DIAGNOSIS — F39 Unspecified mood [affective] disorder: Secondary | ICD-10-CM | POA: Diagnosis not present

## 2022-12-22 DIAGNOSIS — Z9981 Dependence on supplemental oxygen: Secondary | ICD-10-CM | POA: Diagnosis not present

## 2022-12-22 DIAGNOSIS — I69351 Hemiplegia and hemiparesis following cerebral infarction affecting right dominant side: Secondary | ICD-10-CM | POA: Diagnosis not present

## 2022-12-22 DIAGNOSIS — R279 Unspecified lack of coordination: Secondary | ICD-10-CM | POA: Diagnosis not present

## 2022-12-22 DIAGNOSIS — F39 Unspecified mood [affective] disorder: Secondary | ICD-10-CM | POA: Diagnosis not present

## 2022-12-22 DIAGNOSIS — M6281 Muscle weakness (generalized): Secondary | ICD-10-CM | POA: Diagnosis not present

## 2022-12-23 DIAGNOSIS — I69351 Hemiplegia and hemiparesis following cerebral infarction affecting right dominant side: Secondary | ICD-10-CM | POA: Diagnosis not present

## 2022-12-23 DIAGNOSIS — F339 Major depressive disorder, recurrent, unspecified: Secondary | ICD-10-CM | POA: Diagnosis not present

## 2022-12-23 DIAGNOSIS — M6281 Muscle weakness (generalized): Secondary | ICD-10-CM | POA: Diagnosis not present

## 2022-12-23 DIAGNOSIS — F39 Unspecified mood [affective] disorder: Secondary | ICD-10-CM | POA: Diagnosis not present

## 2022-12-23 DIAGNOSIS — G40909 Epilepsy, unspecified, not intractable, without status epilepticus: Secondary | ICD-10-CM | POA: Diagnosis not present

## 2022-12-23 DIAGNOSIS — Z9981 Dependence on supplemental oxygen: Secondary | ICD-10-CM | POA: Diagnosis not present

## 2022-12-23 DIAGNOSIS — R279 Unspecified lack of coordination: Secondary | ICD-10-CM | POA: Diagnosis not present

## 2022-12-23 DIAGNOSIS — I5032 Chronic diastolic (congestive) heart failure: Secondary | ICD-10-CM | POA: Diagnosis not present

## 2022-12-23 DIAGNOSIS — Z515 Encounter for palliative care: Secondary | ICD-10-CM | POA: Diagnosis not present

## 2022-12-24 DIAGNOSIS — M6281 Muscle weakness (generalized): Secondary | ICD-10-CM | POA: Diagnosis not present

## 2022-12-24 DIAGNOSIS — Z9981 Dependence on supplemental oxygen: Secondary | ICD-10-CM | POA: Diagnosis not present

## 2022-12-24 DIAGNOSIS — F39 Unspecified mood [affective] disorder: Secondary | ICD-10-CM | POA: Diagnosis not present

## 2022-12-24 DIAGNOSIS — I69351 Hemiplegia and hemiparesis following cerebral infarction affecting right dominant side: Secondary | ICD-10-CM | POA: Diagnosis not present

## 2022-12-24 DIAGNOSIS — R279 Unspecified lack of coordination: Secondary | ICD-10-CM | POA: Diagnosis not present

## 2022-12-25 DIAGNOSIS — R279 Unspecified lack of coordination: Secondary | ICD-10-CM | POA: Diagnosis not present

## 2022-12-25 DIAGNOSIS — Z9981 Dependence on supplemental oxygen: Secondary | ICD-10-CM | POA: Diagnosis not present

## 2022-12-25 DIAGNOSIS — I69351 Hemiplegia and hemiparesis following cerebral infarction affecting right dominant side: Secondary | ICD-10-CM | POA: Diagnosis not present

## 2022-12-25 DIAGNOSIS — F39 Unspecified mood [affective] disorder: Secondary | ICD-10-CM | POA: Diagnosis not present

## 2022-12-25 DIAGNOSIS — M6281 Muscle weakness (generalized): Secondary | ICD-10-CM | POA: Diagnosis not present

## 2022-12-26 DIAGNOSIS — R279 Unspecified lack of coordination: Secondary | ICD-10-CM | POA: Diagnosis not present

## 2022-12-26 DIAGNOSIS — F39 Unspecified mood [affective] disorder: Secondary | ICD-10-CM | POA: Diagnosis not present

## 2022-12-26 DIAGNOSIS — M6281 Muscle weakness (generalized): Secondary | ICD-10-CM | POA: Diagnosis not present

## 2022-12-26 DIAGNOSIS — I69351 Hemiplegia and hemiparesis following cerebral infarction affecting right dominant side: Secondary | ICD-10-CM | POA: Diagnosis not present

## 2022-12-26 DIAGNOSIS — Z9981 Dependence on supplemental oxygen: Secondary | ICD-10-CM | POA: Diagnosis not present

## 2022-12-30 DIAGNOSIS — F39 Unspecified mood [affective] disorder: Secondary | ICD-10-CM | POA: Diagnosis not present

## 2022-12-30 DIAGNOSIS — R279 Unspecified lack of coordination: Secondary | ICD-10-CM | POA: Diagnosis not present

## 2022-12-30 DIAGNOSIS — I69351 Hemiplegia and hemiparesis following cerebral infarction affecting right dominant side: Secondary | ICD-10-CM | POA: Diagnosis not present

## 2022-12-30 DIAGNOSIS — Z9981 Dependence on supplemental oxygen: Secondary | ICD-10-CM | POA: Diagnosis not present

## 2022-12-30 DIAGNOSIS — M6281 Muscle weakness (generalized): Secondary | ICD-10-CM | POA: Diagnosis not present

## 2022-12-31 DIAGNOSIS — Z9981 Dependence on supplemental oxygen: Secondary | ICD-10-CM | POA: Diagnosis not present

## 2022-12-31 DIAGNOSIS — F39 Unspecified mood [affective] disorder: Secondary | ICD-10-CM | POA: Diagnosis not present

## 2022-12-31 DIAGNOSIS — M6281 Muscle weakness (generalized): Secondary | ICD-10-CM | POA: Diagnosis not present

## 2022-12-31 DIAGNOSIS — R279 Unspecified lack of coordination: Secondary | ICD-10-CM | POA: Diagnosis not present

## 2022-12-31 DIAGNOSIS — I69351 Hemiplegia and hemiparesis following cerebral infarction affecting right dominant side: Secondary | ICD-10-CM | POA: Diagnosis not present

## 2023-01-01 DIAGNOSIS — I69351 Hemiplegia and hemiparesis following cerebral infarction affecting right dominant side: Secondary | ICD-10-CM | POA: Diagnosis not present

## 2023-01-01 DIAGNOSIS — M6281 Muscle weakness (generalized): Secondary | ICD-10-CM | POA: Diagnosis not present

## 2023-01-01 DIAGNOSIS — R279 Unspecified lack of coordination: Secondary | ICD-10-CM | POA: Diagnosis not present

## 2023-01-01 DIAGNOSIS — F39 Unspecified mood [affective] disorder: Secondary | ICD-10-CM | POA: Diagnosis not present

## 2023-01-01 DIAGNOSIS — Z9981 Dependence on supplemental oxygen: Secondary | ICD-10-CM | POA: Diagnosis not present

## 2023-01-02 DIAGNOSIS — M6281 Muscle weakness (generalized): Secondary | ICD-10-CM | POA: Diagnosis not present

## 2023-01-02 DIAGNOSIS — F39 Unspecified mood [affective] disorder: Secondary | ICD-10-CM | POA: Diagnosis not present

## 2023-01-02 DIAGNOSIS — Z9981 Dependence on supplemental oxygen: Secondary | ICD-10-CM | POA: Diagnosis not present

## 2023-01-02 DIAGNOSIS — I69351 Hemiplegia and hemiparesis following cerebral infarction affecting right dominant side: Secondary | ICD-10-CM | POA: Diagnosis not present

## 2023-01-02 DIAGNOSIS — R279 Unspecified lack of coordination: Secondary | ICD-10-CM | POA: Diagnosis not present

## 2023-01-03 DIAGNOSIS — I69351 Hemiplegia and hemiparesis following cerebral infarction affecting right dominant side: Secondary | ICD-10-CM | POA: Diagnosis not present

## 2023-01-03 DIAGNOSIS — Z9981 Dependence on supplemental oxygen: Secondary | ICD-10-CM | POA: Diagnosis not present

## 2023-01-03 DIAGNOSIS — R279 Unspecified lack of coordination: Secondary | ICD-10-CM | POA: Diagnosis not present

## 2023-01-03 DIAGNOSIS — M6281 Muscle weakness (generalized): Secondary | ICD-10-CM | POA: Diagnosis not present

## 2023-01-03 DIAGNOSIS — F39 Unspecified mood [affective] disorder: Secondary | ICD-10-CM | POA: Diagnosis not present

## 2023-01-04 DIAGNOSIS — F39 Unspecified mood [affective] disorder: Secondary | ICD-10-CM | POA: Diagnosis not present

## 2023-01-04 DIAGNOSIS — M6281 Muscle weakness (generalized): Secondary | ICD-10-CM | POA: Diagnosis not present

## 2023-01-04 DIAGNOSIS — I69351 Hemiplegia and hemiparesis following cerebral infarction affecting right dominant side: Secondary | ICD-10-CM | POA: Diagnosis not present

## 2023-01-04 DIAGNOSIS — R279 Unspecified lack of coordination: Secondary | ICD-10-CM | POA: Diagnosis not present

## 2023-01-04 DIAGNOSIS — Z9981 Dependence on supplemental oxygen: Secondary | ICD-10-CM | POA: Diagnosis not present

## 2023-01-05 DIAGNOSIS — Z9981 Dependence on supplemental oxygen: Secondary | ICD-10-CM | POA: Diagnosis not present

## 2023-01-05 DIAGNOSIS — I69351 Hemiplegia and hemiparesis following cerebral infarction affecting right dominant side: Secondary | ICD-10-CM | POA: Diagnosis not present

## 2023-01-05 DIAGNOSIS — F39 Unspecified mood [affective] disorder: Secondary | ICD-10-CM | POA: Diagnosis not present

## 2023-01-05 DIAGNOSIS — R279 Unspecified lack of coordination: Secondary | ICD-10-CM | POA: Diagnosis not present

## 2023-01-05 DIAGNOSIS — M6281 Muscle weakness (generalized): Secondary | ICD-10-CM | POA: Diagnosis not present

## 2023-01-06 DIAGNOSIS — R279 Unspecified lack of coordination: Secondary | ICD-10-CM | POA: Diagnosis not present

## 2023-01-06 DIAGNOSIS — F331 Major depressive disorder, recurrent, moderate: Secondary | ICD-10-CM | POA: Diagnosis not present

## 2023-01-06 DIAGNOSIS — I69351 Hemiplegia and hemiparesis following cerebral infarction affecting right dominant side: Secondary | ICD-10-CM | POA: Diagnosis not present

## 2023-01-06 DIAGNOSIS — M6281 Muscle weakness (generalized): Secondary | ICD-10-CM | POA: Diagnosis not present

## 2023-01-06 DIAGNOSIS — F39 Unspecified mood [affective] disorder: Secondary | ICD-10-CM | POA: Diagnosis not present

## 2023-01-06 DIAGNOSIS — Z9981 Dependence on supplemental oxygen: Secondary | ICD-10-CM | POA: Diagnosis not present

## 2023-01-06 NOTE — Telephone Encounter (Signed)
Pt had appt with Jari Favre, [facility] 10/10/22. PAC mentions sleep study was not able to be done due to: Sleep study was unable to be done due to body habitus.

## 2023-01-07 DIAGNOSIS — E0829 Diabetes mellitus due to underlying condition with other diabetic kidney complication: Secondary | ICD-10-CM | POA: Diagnosis not present

## 2023-01-23 DIAGNOSIS — I5032 Chronic diastolic (congestive) heart failure: Secondary | ICD-10-CM | POA: Diagnosis not present

## 2023-01-23 DIAGNOSIS — G40909 Epilepsy, unspecified, not intractable, without status epilepticus: Secondary | ICD-10-CM | POA: Diagnosis not present

## 2023-01-23 DIAGNOSIS — F339 Major depressive disorder, recurrent, unspecified: Secondary | ICD-10-CM | POA: Diagnosis not present

## 2023-01-23 DIAGNOSIS — Z515 Encounter for palliative care: Secondary | ICD-10-CM | POA: Diagnosis not present

## 2023-01-27 DIAGNOSIS — I482 Chronic atrial fibrillation, unspecified: Secondary | ICD-10-CM | POA: Diagnosis not present

## 2023-01-27 DIAGNOSIS — D6859 Other primary thrombophilia: Secondary | ICD-10-CM | POA: Diagnosis not present

## 2023-01-27 DIAGNOSIS — J9612 Chronic respiratory failure with hypercapnia: Secondary | ICD-10-CM | POA: Diagnosis not present

## 2023-01-27 DIAGNOSIS — G40909 Epilepsy, unspecified, not intractable, without status epilepticus: Secondary | ICD-10-CM | POA: Diagnosis not present

## 2023-01-27 DIAGNOSIS — R052 Subacute cough: Secondary | ICD-10-CM | POA: Diagnosis not present

## 2023-01-27 DIAGNOSIS — I5032 Chronic diastolic (congestive) heart failure: Secondary | ICD-10-CM | POA: Diagnosis not present

## 2023-01-27 DIAGNOSIS — F39 Unspecified mood [affective] disorder: Secondary | ICD-10-CM | POA: Diagnosis not present

## 2023-01-27 DIAGNOSIS — Z7901 Long term (current) use of anticoagulants: Secondary | ICD-10-CM | POA: Diagnosis not present

## 2023-01-27 DIAGNOSIS — I69351 Hemiplegia and hemiparesis following cerebral infarction affecting right dominant side: Secondary | ICD-10-CM | POA: Diagnosis not present

## 2023-01-29 ENCOUNTER — Ambulatory Visit: Payer: Medicare Other | Admitting: Cardiology

## 2023-02-03 DIAGNOSIS — F39 Unspecified mood [affective] disorder: Secondary | ICD-10-CM | POA: Diagnosis not present

## 2023-02-03 DIAGNOSIS — F331 Major depressive disorder, recurrent, moderate: Secondary | ICD-10-CM | POA: Diagnosis not present

## 2023-02-18 DIAGNOSIS — I69351 Hemiplegia and hemiparesis following cerebral infarction affecting right dominant side: Secondary | ICD-10-CM | POA: Diagnosis not present

## 2023-02-18 DIAGNOSIS — Z7901 Long term (current) use of anticoagulants: Secondary | ICD-10-CM | POA: Diagnosis not present

## 2023-02-18 DIAGNOSIS — I482 Chronic atrial fibrillation, unspecified: Secondary | ICD-10-CM | POA: Diagnosis not present

## 2023-02-18 DIAGNOSIS — R791 Abnormal coagulation profile: Secondary | ICD-10-CM | POA: Diagnosis not present

## 2023-02-18 DIAGNOSIS — J449 Chronic obstructive pulmonary disease, unspecified: Secondary | ICD-10-CM | POA: Diagnosis not present

## 2023-03-03 DIAGNOSIS — G40909 Epilepsy, unspecified, not intractable, without status epilepticus: Secondary | ICD-10-CM | POA: Diagnosis not present

## 2023-03-03 DIAGNOSIS — F339 Major depressive disorder, recurrent, unspecified: Secondary | ICD-10-CM | POA: Diagnosis not present

## 2023-03-03 DIAGNOSIS — F331 Major depressive disorder, recurrent, moderate: Secondary | ICD-10-CM | POA: Diagnosis not present

## 2023-03-03 DIAGNOSIS — F39 Unspecified mood [affective] disorder: Secondary | ICD-10-CM | POA: Diagnosis not present

## 2023-03-03 DIAGNOSIS — I5032 Chronic diastolic (congestive) heart failure: Secondary | ICD-10-CM | POA: Diagnosis not present

## 2023-03-03 DIAGNOSIS — Z515 Encounter for palliative care: Secondary | ICD-10-CM | POA: Diagnosis not present

## 2023-03-12 DIAGNOSIS — Z23 Encounter for immunization: Secondary | ICD-10-CM | POA: Diagnosis not present

## 2023-03-12 NOTE — Progress Notes (Signed)
Office Visit    Patient Name: James Villegas Date of Encounter: 03/13/2023  PCP:  Karna Dupes, MD   [facility]  Cardiologist:  Meriam Sprague, MD  Advanced Practice Provider:  No care team member to display Electrophysiologist:  None   HPI    James Villegas is a 67 y.o. male with a past medical history of persistent atrial fibrillation, CVA status post left MCA stroke 2010 with residual right hemiparesis, ascending aortic aneurysm, positive PFO (on Coumadin) with rectus sheath hematoma, HFpEF, hypercoagulable disorder with protein c and s deficiency, positive lupus presents today for follow-up visit.  The patient was previously seen in 2012 by Dr. Excell Seltzer for discussion of PFO closure following left MCA stroke in 2010.  He was lost to follow-up until 2020 when he was seen by Dr. Delton See during an admission for CHF exacerbation and atrial flutter which was new onset in the setting of hypercapnic respiratory failure.  2D echocardiogram was completed showing normal LV systolic function with mildly dilated LA.  He was started on IV Lasix with diuresis and plans to manage PFO medically.  He was seen 2/22 for CHF exacerbation.  He was admitted from [facility] due to altered mental status and hypoxia.  He was found to be hypoxic at 50% on room air and was treated with IV Lasix and DuoNebs with improvement.  Patient's BNP was elevated at 960 and he was diuresed 4.3 L and was placed in Unna boots.  2D echo was completed with EF 65 to 70% with no RWMA with severe eccentric LVH, normal RV, systolic function with moderately elevated pulmonary artery systolic pressures and moderately dilated ascending aortic aneurysm 47 mm.  Patient had improvement in breathing and was discharged on Demadex 20 mg twice a day and was scheduled for outpatient sleep study.  He was also scheduled for follow-up CT scan due to aortic aneurysm.  He was seen 01/23/2022 for overdue  follow-up and was recently seen by palliative care (10/18/2021) and was made a DNR.  During his follow-up patient had gained 10 pounds and was off of his metoprolol for reduction to his torsemide dose.  Currently on Jardiance 10 mg participating in OT for 1 hour a day.  Patient's furosemide was increased to 20 mg twice a day due to lower extremity swelling.  Sleep study was unable to be done due to body habitus.  Patient will require a Hoyer lift and was referred to [facility] for further management.  He presented 08/01/2022 for 52-month follow-up for CHF.  No new cardiac complaints at that time.  Blood pressure 112/62 and heart rate 52 bpm.  EKG was obtained and patient is in permanent atrial fibrillation well-controlled.  Tolerating medications.  Euvolemic with chronic lower extremity swelling.  Weight has been maintained.  He reported that his facility minimizes and moderate salt intake.  Has good urine output.  GDMT limited by bradycardia.  Sleep study was ordered.  Last seen 10/10/2022, he shares with me that he suffered a massive stroke and he cannot use the whole right side of his body.  He lives at [facility] home.  He has not had any recent surveillance of his aorta so we have ordered a CTA today.  His power of attorney is [name] and his sister he has asked me to call her today reiterating the plan.  We have also planned to order some cholesterol labs and he can get these  done at this facility.  Otherwise, doing well today.  Heart rate and blood pressure are stable. No other cardiac concerns  Today, he tells me that overall he has been doing okay.  Heart rate and blood pressure stable.  No new chest pain or shortness of breath.  I also called [name] his sister and POA and updated her on the plan.  For some reason CTA for his ascending aortic aneurysm was not ordered back in May so, we have reordered today.  He also needs some updated labs.  The biggest thing that is bothering him today or his eyes are  itchy and irritated.  We have suggested the facility give him some eyedrops for comfort.  He also stated that he probably needs a colonoscopy and I agreed.  We took a look at a spot on his abdomen that looks like a poorly healed wound.  He states at 1 point was the cyst that was drained and now it is Hardened tissue buildup over it.  Suggested keeping it clean and dry and covered and showing it to his PCP.  Reports no shortness of breath nor dyspnea on exertion. Reports no chest pain, pressure, or tightness. No edema, orthopnea, PND. Reports no palpitations.   Past Medical History    Past Medical History:  Diagnosis Date   Acute on chronic congestive heart failure (HCC)    Anemia, secondary    SECONDARY TO ACUTE BLOOD LOSS   Anxiety disorder    Bloody stool 08/29/2011   intermittent along with constipation.    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 IVC filter   Seizure disorder [facility])    Past Surgical History:  Procedure Laterality Date   dental extraction     multiple   vena cavogram  12/2010   INFERIOR    Allergies  Allergies  Allergen Reactions   Fenofibrate Other (See Comments)    Per MAR     EKGs/Labs/Other Studies Reviewed:   The following studies were reviewed today: Cardiac Studies & Procedures        ECHOCARDIOGRAM  ECHOCARDIOGRAM COMPLETE 09/16/2021  Narrative ECHOCARDIOGRAM REPORT    Patient Name:   James Villegas Date of Exam: 09/16/2021 Medical Rec #:  [id number]      Height:       67.0 in Accession #:    [id number]     Weight:       292.8 lb Date of Birth:  1956/01/17     BSA:          2.378 m Patient Age:    65 years       BP:           132/73 mmHg Patient Gender: M  HR:           85 bpm. Exam Location:  Inpatient  Procedure: 2D Echo  Indications:    CHF  History:        Patient has prior history of Echocardiogram examinations, most recent 07/31/2020. CHF; Arrythmias:Atrial Fibrillation.  Sonographer:    Eduard Roux Referring Phys: [id number] RONDELL A SMITH   Sonographer Comments: Image acquisition challenging due to respiratory motion and Image acquisition challenging due to patient body habitus. IMPRESSIONS   1. Limited study due to poor echo windows. 2. Left ventricular ejection fraction, by estimation, is 70 to 75%. The left ventricle has hyperdynamic function. The left ventricle has no regional wall motion abnormalities. There is severe hypertrophy of the LV septum. The rest of the LV segments demonstrate moderate concentric left ventricular hypertrophy. Diastolic function indeterminant due to Afib. 3. Right ventricular systolic function is normal. The right ventricular size is not well visualized. 4. Left atrial size was moderately dilated. 5. The mitral valve is grossly normal. Trivial mitral valve regurgitation. 6. The aortic valve is tricuspid. There is mild calcification of the aortic valve. There is mild thickening of the aortic valve. Aortic valve regurgitation is mild. Aortic valve sclerosis/calcification is present, without any evidence of aortic stenosis. 7. Aortic dilatation noted. There is moderate dilatation of the aortic root, measuring 45 mm. There is moderate dilatation of the ascending aorta, measuring 45 mm.  Comparison(s):  Compared to prior TTE in 07/2020, there is no significant change. Last TTE with ascending aorta meausring 47mm.  FINDINGS Left Ventricle: Left ventricular ejection fraction, by estimation, is 70 to 75%. The left ventricle has hyperdynamic function. The left ventricle has no regional wall motion abnormalities. The left ventricular internal cavity size was normal in size. There is severe hypertrophy of the LV septum. The rest of the LV segments demonstrate moderate concentric left ventricular hypertrophy. Diastolic function indeterminant due to Afib.  Right Ventricle: The right ventricular size is not well visualized. Right vetricular wall thickness was not well visualized. Right ventricular systolic function is normal.  Left Atrium: Left atrial size was moderately dilated.  Right Atrium: Right atrial size was not well visualized.  Pericardium: There is no evidence of pericardial effusion.  Mitral Valve: The mitral valve is grossly normal. There is mild thickening of the mitral valve leaflet(s). Trivial mitral valve regurgitation.  Tricuspid Valve: The tricuspid valve is normal in structure. Tricuspid valve regurgitation is mild.  Aortic Valve: The aortic valve is tricuspid. There is mild calcification of the aortic valve. There is mild thickening of the aortic valve. Aortic valve regurgitation is mild. Aortic valve sclerosis/calcification is present, without any evidence of aortic stenosis. Aortic valve peak gradient measures 9.5 mmHg.  Pulmonic Valve: The pulmonic valve was normal in structure. Pulmonic valve regurgitation is trivial.  Aorta: Aortic dilatation noted. There is moderate dilatation of the aortic root, measuring 45 mm. There is moderate dilatation of the ascending aorta, measuring 45 mm.  IAS/Shunts: The atrial septum is grossly normal.   LEFT VENTRICLE PLAX 2D LVIDd:         5.60 cm LVIDs:         3.60 cm LV PW:         1.50 cm LV IVS:        2.00 cm LVOT diam:      2.30 cm LV SV:         106 LV SV Index:   44 LVOT Area:     4.15 cm  LEFT ATRIUM              Index LA diam:        5.00 cm  2.10 cm/m LA Vol (A2C):   96.8 ml  40.71 ml/m LA Vol (A4C):   95.7 ml  40.25 ml/m LA Biplane Vol: 100.0 ml 42.06 ml/m AORTIC VALVE                 PULMONIC VALVE AV Area (Vmax): 3.64 cm     PV Vmax:       1.22 m/s AV Vmax:        154.00 cm/s  PV Peak grad:  6.0 mmHg AV Peak Grad:   9.5 mmHg LVOT Vmax:      135.00 cm/s LVOT Vmean:     73.900 cm/s LVOT VTI:       0.254 m  AORTA Ao Root diam: 4.50 cm Ao Asc diam:  4.50 cm  TRICUSPID VALVE TR Peak grad:   44.4 mmHg TR Vmax:        333.00 cm/s  SHUNTS Systemic VTI:  0.25 m Systemic Diam: 2.30 cm  Laurance Flatten MD Electronically signed by Laurance Flatten MD Signature Date/Time: 09/16/2021/11:21:58 AM    Final              EKG:  EKG is not ordered today.   Recent Labs: 08/21/2022: ALT 15; TSH 1.550 08/26/2022: B Natriuretic Peptide 39.1; BUN 16; Creatinine, Ser 0.63; Hemoglobin 14.6; Magnesium 2.3; Platelets 228; Potassium 3.8; Sodium 139  Recent Lipid Panel    Component Value Date/Time   CHOL 207 (H) 07/15/2019 1055   TRIG 91 07/30/2020 1627   HDL 60 07/15/2019 1055   CHOLHDL 3.5 07/15/2019 1055   CHOLHDL 3.8 08/05/2018 0401   VLDL 27 08/05/2018 0401   LDLCALC 122 (H) 07/15/2019 1055    Home Medications   Current Meds  Medication Sig   acetaminophen (TYLENOL) 325 MG tablet Take 650 mg by mouth every 6 (six) hours as needed for mild pain, headache or fever.   Carboxymeth-Glycerin-Polysorb (REFRESH OPTIVE ADVANCED) 0.5-1-0.5 % SOLN Place 1 drop into both eyes in the morning, at noon, in the evening, and at bedtime. Wait 3-5 minutes between eye medications   cycloSPORINE (RESTASIS) 0.05 % ophthalmic emulsion Place 1 drop into both eyes 2 (two) times daily.   empagliflozin (JARDIANCE) 10 MG TABS tablet Take by mouth daily.   ezetimibe (ZETIA) 10 MG tablet Take 10 mg by mouth  daily.   losartan (COZAAR) 25 MG tablet Take 12.5 mg by mouth daily.   omeprazole (PRILOSEC) 20 MG capsule Take 20 mg by mouth daily.   OXYGEN Inhale into the lungs See admin instructions. Nightly 9p-8a   phenytoin (DILANTIN) 100 MG ER capsule Take 200 mg by mouth 2 (two) times daily.   phenytoin (DILANTIN) 50 MG tablet Chew 50 mg by mouth at bedtime. Give with 200mg  capsules for total dose of 250mg  qhs   potassium chloride SA (KLOR-CON) 20 MEQ tablet Take 2 tablets (40 mEq total) by mouth daily.   rosuvastatin (CRESTOR) 40 MG tablet Take 40 mg by mouth at bedtime.   torsemide 40 MG TABS Take 40 mg by mouth 2 (two) times daily.   traZODone (DESYREL) 50 MG tablet Take 50 mg by mouth at bedtime.   venlafaxine XR (EFFEXOR-XR) 37.5 MG 24 hr capsule Take 37.5 mg by mouth at bedtime.   warfarin (COUMADIN) 2 MG tablet Take 2 mg by mouth daily. Take with 2.5mg  tablet  for total dose of 4.5mg    warfarin (COUMADIN) 2.5 MG tablet Take 2.5 mg by mouth daily. Give with the 2mg  to equal 4.5mg      Review of Systems      All other systems reviewed and are otherwise negative except as noted above.  Physical Exam    VS:  BP 98/62   Pulse 60   Ht 6\' 1"  (1.854 m)   SpO2 95%   BMI 32.59 kg/m  , BMI Body mass index is 32.59 kg/m.  Wt Readings from Last 3 Encounters:  10/10/22 247 lb (112 kg)  08/25/22 249 lb 12.8 oz (113.3 kg)  08/01/22 (!) 302 lb (137 kg)     GEN: Well nourished, well developed, in no acute distress. HEENT: normal. Neck: Supple, no JVD, carotid bruits, or masses. Cardiac: RRR, no murmurs, rubs, or gallops. No clubbing, cyanosis, edema.  (Left sided edema from stroke) Radials/PT 2+ and equal bilaterally.  Respiratory:  Respirations regular and unlabored, clear to auscultation on the left, diminished lung sounds on the right. GI: Soft, nontender, nondistended. MS: No deformity or atrophy. Skin: Warm and dry, no rash. Neuro:  Strength and sensation are intact. Psych: Normal  affect.  Assessment & Plan    HFpEF -sometimes he has some swelling on his right side (from his stroke) -Encouraged to continue medications including Jardiance 10 mg daily, that he 10 mg daily, losartan 12.5 g daily, potassium 40 once daily, Crestor daily, spironolactone 25 mg daily, torsemide 40 mg twice a day, Coumadin per Coumadin clinic -last INR 2.1  History of CVA -Right side paralyzed, feb 8th 2010, weakness was the only symptom and couldn't stand up, he got out of bed and got himself ready.  2006 blood clots in his lung.  -he does not think he was on a blood thinner. After 6 months took him out warfarin  -he used to walk and ride a bike all around town but now requires a wheelchair  Ascending aortic aneurysm -CTA ordered today -4.2 cm back then -asked to keep a close watch on BP  Permanent atrial fibrillation -NSR today, HR 60 -no symptoms at this time -Continue current medication regimen, he is on Coumadin for blood thinner  Obesity hypoventilation syndrome -stays on 4L of Oxygen and uses Bipap at night. -stable at this time       Disposition: Follow up 6 months with Meriam Sprague, MD or APP.  Signed, [name], PA-C 03/13/2023, 12:01 PM [facility]

## 2023-03-13 ENCOUNTER — Encounter: Payer: Self-pay | Admitting: Physician Assistant

## 2023-03-13 ENCOUNTER — Ambulatory Visit: Payer: Medicare Other | Attending: Cardiology | Admitting: Physician Assistant

## 2023-03-13 VITALS — BP 98/62 | HR 60 | Ht 73.0 in

## 2023-03-13 DIAGNOSIS — I639 Cerebral infarction, unspecified: Secondary | ICD-10-CM | POA: Diagnosis not present

## 2023-03-13 DIAGNOSIS — R293 Abnormal posture: Secondary | ICD-10-CM | POA: Diagnosis not present

## 2023-03-13 DIAGNOSIS — M6281 Muscle weakness (generalized): Secondary | ICD-10-CM | POA: Diagnosis not present

## 2023-03-13 DIAGNOSIS — Q2112 Patent foramen ovale: Secondary | ICD-10-CM | POA: Insufficient documentation

## 2023-03-13 DIAGNOSIS — I7121 Aneurysm of the ascending aorta, without rupture: Secondary | ICD-10-CM | POA: Diagnosis not present

## 2023-03-13 DIAGNOSIS — E662 Morbid (severe) obesity with alveolar hypoventilation: Secondary | ICD-10-CM | POA: Diagnosis not present

## 2023-03-13 DIAGNOSIS — I5033 Acute on chronic diastolic (congestive) heart failure: Secondary | ICD-10-CM | POA: Insufficient documentation

## 2023-03-13 DIAGNOSIS — M25511 Pain in right shoulder: Secondary | ICD-10-CM | POA: Diagnosis not present

## 2023-03-13 NOTE — Patient Instructions (Signed)
Medication Instructions:   Your physician recommends that you continue on your current medications as directed. Please refer to the Current Medication list given to you today.   *If you need a refill on your cardiac medications before your next appointment, please call your pharmacy*   Lab Work:  BMET  LFT LIPIDS AND CBC AT FACILITY ORDERS SENT   If you have labs (blood work) drawn today and your tests are completely normal, you will receive your results only by: MyChart Message (if you have MyChart) OR A paper copy in the mail If you have any lab test that is abnormal or we need to change your treatment, we will call you to review the results.   Testing/Procedures:  Non-Cardiac CT Angiography (CTA), is a special type of CT scan that uses a computer to produce multi-dimensional views of major blood vessels throughout the body. In CT angiography, a contrast material is injected through an IV to help visualize the blood vessels     Follow-Up: At [facility], you and your health needs are our priority.  As part of our continuing mission to provide you with exceptional heart care, we have created designated Provider Care Teams.  These Care Teams include your primary Cardiologist (physician) and Advanced Practice Providers (APPs -  Physician Assistants and Nurse Practitioners) who all work together to provide you with the care you need, when you need it.  We recommend signing up for the patient portal called "MyChart".  Sign up information is provided on this After Visit Summary.  MyChart is used to connect with patients for Virtual Visits (Telemedicine).  Patients are able to view lab/test results, encounter notes, upcoming appointments, etc.  Non-urgent messages can be sent to your provider as well.   To learn more about what you can do with MyChart, go to [email or web address].    Your next appointment:   6 month(s)  Provider:    Izora Ribas   Other  Instructions Low-Sodium Eating Plan Salt (sodium) helps you keep a healthy balance of fluids in your body. Too much sodium can raise your blood pressure. It can also cause fluid and waste to be held in your body. Your health care provider or dietitian may recommend a low-sodium eating plan if you have high blood pressure (hypertension), kidney disease, liver disease, or heart failure. Eating less sodium can help lower your blood pressure and reduce swelling. It can also protect your heart, liver, and kidneys. What are tips for following this plan? Reading food labels  Check food labels for the amount of sodium per serving. If you eat more than one serving, you must multiply the listed amount by the number of servings. Choose foods with less than 140 milligrams (mg) of sodium per serving. Avoid foods with 300 mg of sodium or more per serving. Always check how much sodium is in a product, even if the label says "unsalted" or "no salt added." Shopping  Buy products labeled as "low-sodium" or "no salt added." Buy fresh foods. Avoid canned foods and pre-made or frozen meals. Avoid canned, cured, or processed meats. Buy breads that have less than 80 mg of sodium per slice. Cooking  Eat more home-cooked food. Try to eat less restaurant, buffet, and fast food. Try not to add salt when you cook. Use salt-free seasonings or herbs instead of table salt or sea salt. Check with your provider or pharmacist before using salt substitutes. Cook with plant-based oils, such as canola, sunflower, or olive oil. Meal planning  When eating at a restaurant, ask if your food can be made with less salt or no salt. Avoid dishes labeled as brined, pickled, cured, or smoked. Avoid dishes made with soy sauce, miso, or teriyaki sauce. Avoid foods that have monosodium glutamate (MSG) in them. MSG may be added to some restaurant food, sauces, soups, bouillon, and canned foods. Make meals that can be grilled, baked, poached,  roasted, or steamed. These are often made with less sodium. General information Try to limit your sodium intake to 1,500-2,300 mg each day, or the amount told by your provider. What foods should I eat? Fruits Fresh, frozen, or canned fruit. Fruit juice. Vegetables Fresh or frozen vegetables. "No salt added" canned vegetables. "No salt added" tomato sauce and paste. Low-sodium or reduced-sodium tomato and vegetable juice. Grains Low-sodium cereals, such as oats, puffed wheat and rice, and shredded wheat. Low-sodium crackers. Unsalted rice. Unsalted pasta. Low-sodium bread. Whole grain breads and whole grain pasta. Meats and other proteins Fresh or frozen meat, poultry, seafood, and fish. These should have no added salt. Low-sodium canned tuna and salmon. Unsalted nuts. Dried peas, beans, and lentils without added salt. Unsalted canned beans. Eggs. Unsalted nut butters. Dairy Milk. Soy milk. Cheese that is naturally low in sodium, such as ricotta cheese, fresh mozzarella, or Swiss cheese. Low-sodium or reduced-sodium cheese. Cream cheese. Yogurt. Seasonings and condiments Fresh and dried herbs and spices. Salt-free seasonings. Low-sodium mustard and ketchup. Sodium-free salad dressing. Sodium-free light mayonnaise. Fresh or refrigerated horseradish. Lemon juice. Vinegar. Other foods Homemade, reduced-sodium, or low-sodium soups. Unsalted popcorn and pretzels. Low-salt or salt-free chips. The items listed above may not be all the foods and drinks you can have. Talk to a dietitian to learn more. What foods should I avoid? Vegetables Sauerkraut, pickled vegetables, and relishes. Olives. [place] fries. Onion rings. Regular canned vegetables, except low-sodium or reduced-sodium items. Regular canned tomato sauce and paste. Regular tomato and vegetable juice. Frozen vegetables in sauces. Grains Instant hot cereals. Bread stuffing, pancake, and biscuit mixes. Croutons. Seasoned rice or pasta mixes.  Noodle soup cups. Boxed or frozen macaroni and cheese. Regular salted crackers. Self-rising flour. Meats and other proteins Meat or fish that is salted, canned, smoked, spiced, or pickled. Precooked or cured meat, such as sausages or meat loaves. Tomasa Blase. Ham. Pepperoni. Hot dogs. Corned beef. Chipped beef. Salt pork. Jerky. Pickled herring, anchovies, and sardines. Regular canned tuna. Salted nuts. Dairy Processed cheese and cheese spreads. Hard cheeses. Cheese curds. Blue cheese. Feta cheese. String cheese. Regular cottage cheese. Buttermilk. Canned milk. Fats and oils Salted butter. Regular margarine. Ghee. Bacon fat. Seasonings and condiments Onion salt, garlic salt, seasoned salt, table salt, and sea salt. Canned and packaged gravies. Worcestershire sauce. Tartar sauce. Barbecue sauce. Teriyaki sauce. Soy sauce, including reduced-sodium soy sauce. Steak sauce. Fish sauce. Oyster sauce. Cocktail sauce. Horseradish that you find on the shelf. Regular ketchup and mustard. Meat flavorings and tenderizers. Bouillon cubes. Hot sauce. Pre-made or packaged marinades. Pre-made or packaged taco seasonings. Relishes. Regular salad dressings. Salsa. Other foods Salted popcorn and pretzels. Corn chips and puffs. Potato and tortilla chips. Canned or dried soups. Pizza. Frozen entrees and pot pies. The items listed above may not be all the foods and drinks you should avoid. Talk to a dietitian to learn more. This information is not intended to replace advice given to you by your health care provider. Make sure you discuss any questions you have with your health care provider. Document Revised: 06/12/2022 Document Reviewed: 06/12/2022 Elsevier  Patient Education  2024 [employer]  Heart-Healthy Eating Plan Eating a healthy diet is important for the health of your heart. A heart-healthy eating plan includes: Eating less unhealthy fats. Eating more healthy fats. Eating less salt in your food. Salt is also  called sodium. Making other changes in your diet. Talk with your doctor or a diet specialist (dietitian) to create an eating plan that is right for you. What is my plan? Your doctor may recommend an eating plan that includes: Total fat: ______% or less of total calories a day. Saturated fat: ______% or less of total calories a day. Cholesterol: less than _________mg a day. Sodium: less than _________mg a day. What are tips for following this plan? Cooking Avoid frying your food. Try to bake, boil, grill, or broil it instead. You can also reduce fat by: Removing the skin from poultry. Removing all visible fats from meats. Steaming vegetables in water or broth. Meal planning  At meals, divide your plate into four equal parts: Fill one-half of your plate with vegetables and green salads. Fill one-fourth of your plate with whole grains. Fill one-fourth of your plate with lean protein foods. Eat 2-4 cups of vegetables per day. One cup of vegetables is: 1 cup (91 g) broccoli or cauliflower florets. 2 medium carrots. 1 large bell pepper. 1 large sweet potato. 1 large tomato. 1 medium white potato. 2 cups (150 g) raw leafy greens. Eat 1-2 cups of fruit per day. One cup of fruit is: 1 small apple 1 large banana 1 cup (237 g) mixed fruit, 1 large orange,  cup (82 g) dried fruit, 1 cup (240 mL) 100% fruit juice. Eat more foods that have soluble fiber. These are apples, broccoli, carrots, beans, peas, and barley. Try to get 20-30 g of fiber per day. Eat 4-5 servings of nuts, legumes, and seeds per week: 1 serving of dried beans or legumes equals  cup (90 g) cooked. 1 serving of nuts is  oz (12 almonds, 24 pistachios, or 7 walnut halves). 1 serving of seeds equals  oz (8 g). General information Eat more home-cooked food. Eat less restaurant, buffet, and fast food. Limit or avoid alcohol. Limit foods that are high in starch and sugar. Avoid fried foods. Lose weight if you are  overweight. Keep track of how much salt (sodium) you eat. This is important if you have high blood pressure. Ask your doctor to tell you more about this. Try to add vegetarian meals each week. Fats Choose healthy fats. These include olive oil and canola oil, flaxseeds, walnuts, almonds, and seeds. Eat more omega-3 fats. These include salmon, mackerel, sardines, tuna, flaxseed oil, and ground flaxseeds. Try to eat fish at least 2 times each week. Check food labels. Avoid foods with trans fats or high amounts of saturated fat. Limit saturated fats. These are often found in animal products, such as meats, butter, and cream. These are also found in plant foods, such as palm oil, palm kernel oil, and coconut oil. Avoid foods with partially hydrogenated oils in them. These have trans fats. Examples are stick margarine, some tub margarines, cookies, crackers, and other baked goods. What foods should I eat? Fruits All fresh, canned (in natural juice), or frozen fruits. Vegetables Fresh or frozen vegetables (raw, steamed, roasted, or grilled). Green salads. Grains Most grains. Choose whole wheat and whole grains most of the time. Rice and pasta, including brown rice and pastas made with whole wheat. Meats and other proteins Lean, well-trimmed beef, veal,  pork, and lamb. Chicken and [place] without skin. All fish and shellfish. Wild duck, rabbit, pheasant, and venison. Egg whites or low-cholesterol egg substitutes. Dried beans, peas, lentils, and tofu. Seeds and most nuts. Dairy Low-fat or nonfat cheeses, including ricotta and mozzarella. Skim or 1% milk that is liquid, powdered, or evaporated. Buttermilk that is made with low-fat milk. Nonfat or low-fat yogurt. Fats and oils Non-hydrogenated (trans-free) margarines. Vegetable oils, including soybean, sesame, sunflower, olive, peanut, safflower, corn, canola, and cottonseed. Salad dressings or mayonnaise made with a vegetable oil. Beverages Mineral  water. Coffee and tea. Diet carbonated beverages. Sweets and desserts Sherbet, gelatin, and fruit ice. Small amounts of dark chocolate. Limit all sweets and desserts. Seasonings and condiments All seasonings and condiments. The items listed above may not be a complete list of foods and drinks you can eat. Contact a dietitian for more options. What foods should I avoid? Fruits Canned fruit in heavy syrup. Fruit in cream or butter sauce. Fried fruit. Limit coconut. Vegetables Vegetables cooked in cheese, cream, or butter sauce. Fried vegetables. Grains Breads that are made with saturated or trans fats, oils, or whole milk. Croissants. Sweet rolls. Donuts. High-fat crackers, such as cheese crackers. Meats and other proteins Fatty meats, such as hot dogs, ribs, sausage, bacon, rib-eye roast or steak. High-fat deli meats, such as salami and bologna. Caviar. Domestic duck and goose. Organ meats, such as liver. Dairy Cream, sour cream, cream cheese, and creamed cottage cheese. Whole-milk cheeses. Whole or 2% milk that is liquid, evaporated, or condensed. Whole buttermilk. Cream sauce or high-fat cheese sauce. Yogurt that is made from whole milk. Fats and oils Meat fat, or shortening. Cocoa butter, hydrogenated oils, palm oil, coconut oil, palm kernel oil. Solid fats and shortenings, including bacon fat, salt pork, lard, and butter. Nondairy cream substitutes. Salad dressings with cheese or sour cream. Beverages Regular sodas and juice drinks with added sugar. Sweets and desserts Frosting. Pudding. Cookies. Cakes. Pies. Milk chocolate or white chocolate. Buttered syrups. Full-fat ice cream or ice cream drinks. The items listed above may not be a complete list of foods and drinks to avoid. Contact a dietitian for more information. Summary Heart-healthy meal planning includes eating less unhealthy fats, eating more healthy fats, and making other changes in your diet. Eat a balanced diet. This  includes fruits and vegetables, low-fat or nonfat dairy, lean protein, nuts and legumes, whole grains, and heart-healthy oils and fats. This information is not intended to replace advice given to you by your health care provider. Make sure you discuss any questions you have with your health care provider. Document Revised: 07/01/2021 Document Reviewed: 07/01/2021 Elsevier Patient Education  2024 [employer].

## 2023-03-16 DIAGNOSIS — R293 Abnormal posture: Secondary | ICD-10-CM | POA: Diagnosis not present

## 2023-03-16 DIAGNOSIS — I5033 Acute on chronic diastolic (congestive) heart failure: Secondary | ICD-10-CM | POA: Diagnosis not present

## 2023-03-16 DIAGNOSIS — M6281 Muscle weakness (generalized): Secondary | ICD-10-CM | POA: Diagnosis not present

## 2023-03-16 DIAGNOSIS — M25511 Pain in right shoulder: Secondary | ICD-10-CM | POA: Diagnosis not present

## 2023-03-17 ENCOUNTER — Ambulatory Visit (HOSPITAL_COMMUNITY)
Admission: RE | Admit: 2023-03-17 | Discharge: 2023-03-17 | Disposition: A | Discharge status: Home / Self Care | Payer: Medicare Other | Source: Ambulatory Visit | Attending: Physician Assistant | Admitting: Physician Assistant

## 2023-03-17 DIAGNOSIS — R293 Abnormal posture: Secondary | ICD-10-CM | POA: Diagnosis not present

## 2023-03-17 DIAGNOSIS — M6281 Muscle weakness (generalized): Secondary | ICD-10-CM | POA: Diagnosis not present

## 2023-03-17 DIAGNOSIS — G40909 Epilepsy, unspecified, not intractable, without status epilepticus: Secondary | ICD-10-CM | POA: Diagnosis not present

## 2023-03-17 DIAGNOSIS — I517 Cardiomegaly: Secondary | ICD-10-CM | POA: Diagnosis not present

## 2023-03-17 DIAGNOSIS — Z515 Encounter for palliative care: Secondary | ICD-10-CM | POA: Diagnosis not present

## 2023-03-17 DIAGNOSIS — F339 Major depressive disorder, recurrent, unspecified: Secondary | ICD-10-CM | POA: Diagnosis not present

## 2023-03-17 DIAGNOSIS — I7121 Aneurysm of the ascending aorta, without rupture: Secondary | ICD-10-CM | POA: Insufficient documentation

## 2023-03-17 DIAGNOSIS — I5033 Acute on chronic diastolic (congestive) heart failure: Secondary | ICD-10-CM | POA: Diagnosis not present

## 2023-03-17 DIAGNOSIS — M25511 Pain in right shoulder: Secondary | ICD-10-CM | POA: Diagnosis not present

## 2023-03-17 DIAGNOSIS — I5032 Chronic diastolic (congestive) heart failure: Secondary | ICD-10-CM | POA: Diagnosis not present

## 2023-03-17 MED ORDER — IOHEXOL 350 MG/ML SOLN
75.0000 mL | Freq: Once | INTRAVENOUS | Status: AC | PRN
  Administered 2023-03-17: 75 mL via INTRAVENOUS

## 2023-03-18 DIAGNOSIS — M6281 Muscle weakness (generalized): Secondary | ICD-10-CM | POA: Diagnosis not present

## 2023-03-18 DIAGNOSIS — I5033 Acute on chronic diastolic (congestive) heart failure: Secondary | ICD-10-CM | POA: Diagnosis not present

## 2023-03-18 DIAGNOSIS — R293 Abnormal posture: Secondary | ICD-10-CM | POA: Diagnosis not present

## 2023-03-18 DIAGNOSIS — M25511 Pain in right shoulder: Secondary | ICD-10-CM | POA: Diagnosis not present

## 2023-03-19 DIAGNOSIS — M25511 Pain in right shoulder: Secondary | ICD-10-CM | POA: Diagnosis not present

## 2023-03-19 DIAGNOSIS — I5033 Acute on chronic diastolic (congestive) heart failure: Secondary | ICD-10-CM | POA: Diagnosis not present

## 2023-03-19 DIAGNOSIS — M6281 Muscle weakness (generalized): Secondary | ICD-10-CM | POA: Diagnosis not present

## 2023-03-19 DIAGNOSIS — R293 Abnormal posture: Secondary | ICD-10-CM | POA: Diagnosis not present

## 2023-03-20 DIAGNOSIS — M6281 Muscle weakness (generalized): Secondary | ICD-10-CM | POA: Diagnosis not present

## 2023-03-20 DIAGNOSIS — R293 Abnormal posture: Secondary | ICD-10-CM | POA: Diagnosis not present

## 2023-03-20 DIAGNOSIS — M25511 Pain in right shoulder: Secondary | ICD-10-CM | POA: Diagnosis not present

## 2023-03-20 DIAGNOSIS — I5033 Acute on chronic diastolic (congestive) heart failure: Secondary | ICD-10-CM | POA: Diagnosis not present

## 2023-03-23 DIAGNOSIS — I5033 Acute on chronic diastolic (congestive) heart failure: Secondary | ICD-10-CM | POA: Diagnosis not present

## 2023-03-23 DIAGNOSIS — M6281 Muscle weakness (generalized): Secondary | ICD-10-CM | POA: Diagnosis not present

## 2023-03-23 DIAGNOSIS — M25511 Pain in right shoulder: Secondary | ICD-10-CM | POA: Diagnosis not present

## 2023-03-23 DIAGNOSIS — R293 Abnormal posture: Secondary | ICD-10-CM | POA: Diagnosis not present

## 2023-03-24 DIAGNOSIS — M25511 Pain in right shoulder: Secondary | ICD-10-CM | POA: Diagnosis not present

## 2023-03-24 DIAGNOSIS — R293 Abnormal posture: Secondary | ICD-10-CM | POA: Diagnosis not present

## 2023-03-24 DIAGNOSIS — M6281 Muscle weakness (generalized): Secondary | ICD-10-CM | POA: Diagnosis not present

## 2023-03-24 DIAGNOSIS — I5033 Acute on chronic diastolic (congestive) heart failure: Secondary | ICD-10-CM | POA: Diagnosis not present

## 2023-03-25 DIAGNOSIS — R293 Abnormal posture: Secondary | ICD-10-CM | POA: Diagnosis not present

## 2023-03-25 DIAGNOSIS — M6281 Muscle weakness (generalized): Secondary | ICD-10-CM | POA: Diagnosis not present

## 2023-03-25 DIAGNOSIS — I5033 Acute on chronic diastolic (congestive) heart failure: Secondary | ICD-10-CM | POA: Diagnosis not present

## 2023-03-25 DIAGNOSIS — M25511 Pain in right shoulder: Secondary | ICD-10-CM | POA: Diagnosis not present

## 2023-03-26 DIAGNOSIS — M25511 Pain in right shoulder: Secondary | ICD-10-CM | POA: Diagnosis not present

## 2023-03-26 DIAGNOSIS — I5033 Acute on chronic diastolic (congestive) heart failure: Secondary | ICD-10-CM | POA: Diagnosis not present

## 2023-03-26 DIAGNOSIS — R293 Abnormal posture: Secondary | ICD-10-CM | POA: Diagnosis not present

## 2023-03-26 DIAGNOSIS — M6281 Muscle weakness (generalized): Secondary | ICD-10-CM | POA: Diagnosis not present

## 2023-03-27 DIAGNOSIS — R293 Abnormal posture: Secondary | ICD-10-CM | POA: Diagnosis not present

## 2023-03-27 DIAGNOSIS — I5033 Acute on chronic diastolic (congestive) heart failure: Secondary | ICD-10-CM | POA: Diagnosis not present

## 2023-03-27 DIAGNOSIS — M6281 Muscle weakness (generalized): Secondary | ICD-10-CM | POA: Diagnosis not present

## 2023-03-27 DIAGNOSIS — M25511 Pain in right shoulder: Secondary | ICD-10-CM | POA: Diagnosis not present

## 2023-03-30 DIAGNOSIS — I5033 Acute on chronic diastolic (congestive) heart failure: Secondary | ICD-10-CM | POA: Diagnosis not present

## 2023-03-30 DIAGNOSIS — M6281 Muscle weakness (generalized): Secondary | ICD-10-CM | POA: Diagnosis not present

## 2023-03-30 DIAGNOSIS — M25511 Pain in right shoulder: Secondary | ICD-10-CM | POA: Diagnosis not present

## 2023-03-30 DIAGNOSIS — R293 Abnormal posture: Secondary | ICD-10-CM | POA: Diagnosis not present

## 2023-03-31 DIAGNOSIS — L602 Onychogryphosis: Secondary | ICD-10-CM | POA: Diagnosis not present

## 2023-03-31 DIAGNOSIS — I5033 Acute on chronic diastolic (congestive) heart failure: Secondary | ICD-10-CM | POA: Diagnosis not present

## 2023-03-31 DIAGNOSIS — L853 Xerosis cutis: Secondary | ICD-10-CM | POA: Diagnosis not present

## 2023-03-31 DIAGNOSIS — I739 Peripheral vascular disease, unspecified: Secondary | ICD-10-CM | POA: Diagnosis not present

## 2023-03-31 DIAGNOSIS — M6281 Muscle weakness (generalized): Secondary | ICD-10-CM | POA: Diagnosis not present

## 2023-03-31 DIAGNOSIS — F331 Major depressive disorder, recurrent, moderate: Secondary | ICD-10-CM | POA: Diagnosis not present

## 2023-03-31 DIAGNOSIS — R293 Abnormal posture: Secondary | ICD-10-CM | POA: Diagnosis not present

## 2023-03-31 DIAGNOSIS — M25511 Pain in right shoulder: Secondary | ICD-10-CM | POA: Diagnosis not present

## 2023-03-31 DIAGNOSIS — L603 Nail dystrophy: Secondary | ICD-10-CM | POA: Diagnosis not present

## 2023-03-31 DIAGNOSIS — F39 Unspecified mood [affective] disorder: Secondary | ICD-10-CM | POA: Diagnosis not present

## 2023-04-01 ENCOUNTER — Telehealth: Payer: Self-pay | Admitting: Physician Assistant

## 2023-04-01 DIAGNOSIS — M6281 Muscle weakness (generalized): Secondary | ICD-10-CM | POA: Diagnosis not present

## 2023-04-01 DIAGNOSIS — R293 Abnormal posture: Secondary | ICD-10-CM | POA: Diagnosis not present

## 2023-04-01 DIAGNOSIS — M25511 Pain in right shoulder: Secondary | ICD-10-CM | POA: Diagnosis not present

## 2023-04-01 DIAGNOSIS — I5033 Acute on chronic diastolic (congestive) heart failure: Secondary | ICD-10-CM | POA: Diagnosis not present

## 2023-04-01 NOTE — Telephone Encounter (Signed)
Follow Up:     Patient said he was returning calls from where he received this month(October). He said he did not know who called.

## 2023-04-02 DIAGNOSIS — M25511 Pain in right shoulder: Secondary | ICD-10-CM | POA: Diagnosis not present

## 2023-04-02 DIAGNOSIS — R293 Abnormal posture: Secondary | ICD-10-CM | POA: Diagnosis not present

## 2023-04-02 DIAGNOSIS — I5033 Acute on chronic diastolic (congestive) heart failure: Secondary | ICD-10-CM | POA: Diagnosis not present

## 2023-04-02 DIAGNOSIS — M6281 Muscle weakness (generalized): Secondary | ICD-10-CM | POA: Diagnosis not present

## 2023-04-03 DIAGNOSIS — M6281 Muscle weakness (generalized): Secondary | ICD-10-CM | POA: Diagnosis not present

## 2023-04-03 DIAGNOSIS — M25511 Pain in right shoulder: Secondary | ICD-10-CM | POA: Diagnosis not present

## 2023-04-03 DIAGNOSIS — I5033 Acute on chronic diastolic (congestive) heart failure: Secondary | ICD-10-CM | POA: Diagnosis not present

## 2023-04-03 DIAGNOSIS — R293 Abnormal posture: Secondary | ICD-10-CM | POA: Diagnosis not present

## 2023-04-06 DIAGNOSIS — M25511 Pain in right shoulder: Secondary | ICD-10-CM | POA: Diagnosis not present

## 2023-04-06 DIAGNOSIS — M6281 Muscle weakness (generalized): Secondary | ICD-10-CM | POA: Diagnosis not present

## 2023-04-06 DIAGNOSIS — I5033 Acute on chronic diastolic (congestive) heart failure: Secondary | ICD-10-CM | POA: Diagnosis not present

## 2023-04-06 DIAGNOSIS — R293 Abnormal posture: Secondary | ICD-10-CM | POA: Diagnosis not present

## 2023-04-08 NOTE — Telephone Encounter (Signed)
Spoke with pt and advised of CT results and recommendation as below.  Pt verbalizes understanding and thanked Charity fundraiser for the call.  James Villegas,   Your ascending thoracic aortic aneurysm is 4.4 cm. Please keep a close eye on your BP and we will plan for annual imaging.   [name]

## 2023-04-08 NOTE — Telephone Encounter (Signed)
Pt returning call for CT results from 10/8

## 2023-04-13 DIAGNOSIS — D489 Neoplasm of uncertain behavior, unspecified: Secondary | ICD-10-CM | POA: Diagnosis not present

## 2023-04-13 DIAGNOSIS — C44519 Basal cell carcinoma of skin of other part of trunk: Secondary | ICD-10-CM | POA: Diagnosis not present

## 2023-04-13 DIAGNOSIS — C44529 Squamous cell carcinoma of skin of other part of trunk: Secondary | ICD-10-CM | POA: Diagnosis not present

## 2023-04-17 DIAGNOSIS — M769 Unspecified enthesopathy, lower limb, excluding foot: Secondary | ICD-10-CM | POA: Diagnosis not present

## 2023-04-17 DIAGNOSIS — Z7901 Long term (current) use of anticoagulants: Secondary | ICD-10-CM | POA: Diagnosis not present

## 2023-04-24 DIAGNOSIS — I69351 Hemiplegia and hemiparesis following cerebral infarction affecting right dominant side: Secondary | ICD-10-CM | POA: Diagnosis not present

## 2023-04-24 DIAGNOSIS — Z7901 Long term (current) use of anticoagulants: Secondary | ICD-10-CM | POA: Diagnosis not present

## 2023-04-24 DIAGNOSIS — M769 Unspecified enthesopathy, lower limb, excluding foot: Secondary | ICD-10-CM | POA: Diagnosis not present

## 2023-04-24 DIAGNOSIS — M79651 Pain in right thigh: Secondary | ICD-10-CM | POA: Diagnosis not present

## 2023-04-28 DIAGNOSIS — F39 Unspecified mood [affective] disorder: Secondary | ICD-10-CM | POA: Diagnosis not present

## 2023-04-28 DIAGNOSIS — F331 Major depressive disorder, recurrent, moderate: Secondary | ICD-10-CM | POA: Diagnosis not present

## 2023-04-30 DIAGNOSIS — F339 Major depressive disorder, recurrent, unspecified: Secondary | ICD-10-CM | POA: Diagnosis not present

## 2023-04-30 DIAGNOSIS — G40909 Epilepsy, unspecified, not intractable, without status epilepticus: Secondary | ICD-10-CM | POA: Diagnosis not present

## 2023-04-30 DIAGNOSIS — Z515 Encounter for palliative care: Secondary | ICD-10-CM | POA: Diagnosis not present

## 2023-04-30 DIAGNOSIS — I5032 Chronic diastolic (congestive) heart failure: Secondary | ICD-10-CM | POA: Diagnosis not present

## 2023-05-12 DIAGNOSIS — F331 Major depressive disorder, recurrent, moderate: Secondary | ICD-10-CM | POA: Diagnosis not present

## 2023-05-18 DIAGNOSIS — G40909 Epilepsy, unspecified, not intractable, without status epilepticus: Secondary | ICD-10-CM | POA: Diagnosis not present

## 2023-05-18 DIAGNOSIS — F339 Major depressive disorder, recurrent, unspecified: Secondary | ICD-10-CM | POA: Diagnosis not present

## 2023-05-18 DIAGNOSIS — Z515 Encounter for palliative care: Secondary | ICD-10-CM | POA: Diagnosis not present

## 2023-05-18 DIAGNOSIS — I5032 Chronic diastolic (congestive) heart failure: Secondary | ICD-10-CM | POA: Diagnosis not present

## 2023-05-29 DIAGNOSIS — J9610 Chronic respiratory failure, unspecified whether with hypoxia or hypercapnia: Secondary | ICD-10-CM | POA: Diagnosis not present

## 2023-05-29 DIAGNOSIS — I69351 Hemiplegia and hemiparesis following cerebral infarction affecting right dominant side: Secondary | ICD-10-CM | POA: Diagnosis not present

## 2023-05-29 DIAGNOSIS — G40909 Epilepsy, unspecified, not intractable, without status epilepticus: Secondary | ICD-10-CM | POA: Diagnosis not present

## 2023-05-29 DIAGNOSIS — Z6837 Body mass index (BMI) 37.0-37.9, adult: Secondary | ICD-10-CM | POA: Diagnosis not present

## 2023-05-29 DIAGNOSIS — R051 Acute cough: Secondary | ICD-10-CM | POA: Diagnosis not present

## 2023-05-29 DIAGNOSIS — F39 Unspecified mood [affective] disorder: Secondary | ICD-10-CM | POA: Diagnosis not present

## 2023-05-29 DIAGNOSIS — D6859 Other primary thrombophilia: Secondary | ICD-10-CM | POA: Diagnosis not present

## 2023-05-29 DIAGNOSIS — I5032 Chronic diastolic (congestive) heart failure: Secondary | ICD-10-CM | POA: Diagnosis not present

## 2023-05-29 DIAGNOSIS — I482 Chronic atrial fibrillation, unspecified: Secondary | ICD-10-CM | POA: Diagnosis not present

## 2023-05-29 DIAGNOSIS — J9612 Chronic respiratory failure with hypercapnia: Secondary | ICD-10-CM | POA: Diagnosis not present

## 2023-05-29 DIAGNOSIS — Z7901 Long term (current) use of anticoagulants: Secondary | ICD-10-CM | POA: Diagnosis not present

## 2023-06-01 DIAGNOSIS — I5020 Unspecified systolic (congestive) heart failure: Secondary | ICD-10-CM | POA: Diagnosis not present

## 2023-06-05 DIAGNOSIS — Z7901 Long term (current) use of anticoagulants: Secondary | ICD-10-CM | POA: Diagnosis not present

## 2023-06-05 DIAGNOSIS — I482 Chronic atrial fibrillation, unspecified: Secondary | ICD-10-CM | POA: Diagnosis not present

## 2023-06-05 DIAGNOSIS — D6869 Other thrombophilia: Secondary | ICD-10-CM | POA: Diagnosis not present

## 2023-06-05 DIAGNOSIS — J9612 Chronic respiratory failure with hypercapnia: Secondary | ICD-10-CM | POA: Diagnosis not present

## 2023-06-05 DIAGNOSIS — I5032 Chronic diastolic (congestive) heart failure: Secondary | ICD-10-CM | POA: Diagnosis not present

## 2023-06-05 DIAGNOSIS — I10 Essential (primary) hypertension: Secondary | ICD-10-CM | POA: Diagnosis not present

## 2023-06-09 DIAGNOSIS — F331 Major depressive disorder, recurrent, moderate: Secondary | ICD-10-CM | POA: Diagnosis not present

## 2023-10-20 ENCOUNTER — Other Ambulatory Visit: Payer: Self-pay

## 2023-10-20 ENCOUNTER — Encounter (HOSPITAL_COMMUNITY): Payer: Self-pay

## 2023-10-20 ENCOUNTER — Inpatient Hospital Stay (HOSPITAL_COMMUNITY)
Admission: EM | Admit: 2023-10-20 | Discharge: 2023-10-23 | DRG: 205 | Disposition: A | Discharge status: Skilled Nursing Facility | Source: Skilled Nursing Facility | Attending: Infectious Diseases | Admitting: Infectious Diseases

## 2023-10-20 ENCOUNTER — Emergency Department (HOSPITAL_COMMUNITY)

## 2023-10-20 DIAGNOSIS — Q2112 Patent foramen ovale: Secondary | ICD-10-CM

## 2023-10-20 DIAGNOSIS — I69351 Hemiplegia and hemiparesis following cerebral infarction affecting right dominant side: Secondary | ICD-10-CM

## 2023-10-20 DIAGNOSIS — M62838 Other muscle spasm: Secondary | ICD-10-CM | POA: Diagnosis not present

## 2023-10-20 DIAGNOSIS — Z8616 Personal history of COVID-19: Secondary | ICD-10-CM

## 2023-10-20 DIAGNOSIS — I509 Heart failure, unspecified: Secondary | ICD-10-CM

## 2023-10-20 DIAGNOSIS — Z91198 Patient's noncompliance with other medical treatment and regimen for other reason: Secondary | ICD-10-CM

## 2023-10-20 DIAGNOSIS — I6930 Unspecified sequelae of cerebral infarction: Secondary | ICD-10-CM

## 2023-10-20 DIAGNOSIS — E662 Morbid (severe) obesity with alveolar hypoventilation: Principal | ICD-10-CM | POA: Diagnosis present

## 2023-10-20 DIAGNOSIS — R7989 Other specified abnormal findings of blood chemistry: Secondary | ICD-10-CM | POA: Diagnosis present

## 2023-10-20 DIAGNOSIS — R0600 Dyspnea, unspecified: Secondary | ICD-10-CM | POA: Diagnosis not present

## 2023-10-20 DIAGNOSIS — R0689 Other abnormalities of breathing: Secondary | ICD-10-CM | POA: Diagnosis present

## 2023-10-20 DIAGNOSIS — K2100 Gastro-esophageal reflux disease with esophagitis, without bleeding: Secondary | ICD-10-CM | POA: Diagnosis present

## 2023-10-20 DIAGNOSIS — D6862 Lupus anticoagulant syndrome: Secondary | ICD-10-CM | POA: Diagnosis present

## 2023-10-20 DIAGNOSIS — Z6838 Body mass index (BMI) 38.0-38.9, adult: Secondary | ICD-10-CM

## 2023-10-20 DIAGNOSIS — Z87891 Personal history of nicotine dependence: Secondary | ICD-10-CM

## 2023-10-20 DIAGNOSIS — I639 Cerebral infarction, unspecified: Secondary | ICD-10-CM | POA: Diagnosis present

## 2023-10-20 DIAGNOSIS — Z79899 Other long term (current) drug therapy: Secondary | ICD-10-CM

## 2023-10-20 DIAGNOSIS — I82409 Acute embolism and thrombosis of unspecified deep veins of unspecified lower extremity: Secondary | ICD-10-CM | POA: Diagnosis present

## 2023-10-20 DIAGNOSIS — E8729 Other acidosis: Secondary | ICD-10-CM | POA: Diagnosis present

## 2023-10-20 DIAGNOSIS — L89151 Pressure ulcer of sacral region, stage 1: Secondary | ICD-10-CM | POA: Diagnosis present

## 2023-10-20 DIAGNOSIS — Z9981 Dependence on supplemental oxygen: Secondary | ICD-10-CM

## 2023-10-20 DIAGNOSIS — Z7901 Long term (current) use of anticoagulants: Secondary | ICD-10-CM

## 2023-10-20 DIAGNOSIS — Z888 Allergy status to other drugs, medicaments and biological substances status: Secondary | ICD-10-CM

## 2023-10-20 DIAGNOSIS — Z7984 Long term (current) use of oral hypoglycemic drugs: Secondary | ICD-10-CM

## 2023-10-20 DIAGNOSIS — I4821 Permanent atrial fibrillation: Secondary | ICD-10-CM | POA: Diagnosis present

## 2023-10-20 DIAGNOSIS — F32A Depression, unspecified: Secondary | ICD-10-CM | POA: Diagnosis present

## 2023-10-20 DIAGNOSIS — Z1152 Encounter for screening for COVID-19: Secondary | ICD-10-CM

## 2023-10-20 DIAGNOSIS — G9349 Other encephalopathy: Secondary | ICD-10-CM | POA: Diagnosis present

## 2023-10-20 DIAGNOSIS — I5033 Acute on chronic diastolic (congestive) heart failure: Secondary | ICD-10-CM | POA: Diagnosis present

## 2023-10-20 DIAGNOSIS — N390 Urinary tract infection, site not specified: Secondary | ICD-10-CM | POA: Diagnosis present

## 2023-10-20 DIAGNOSIS — G40909 Epilepsy, unspecified, not intractable, without status epilepticus: Secondary | ICD-10-CM

## 2023-10-20 DIAGNOSIS — B9620 Unspecified Escherichia coli [E. coli] as the cause of diseases classified elsewhere: Secondary | ICD-10-CM | POA: Diagnosis present

## 2023-10-20 DIAGNOSIS — J9622 Acute and chronic respiratory failure with hypercapnia: Secondary | ICD-10-CM | POA: Diagnosis present

## 2023-10-20 DIAGNOSIS — E785 Hyperlipidemia, unspecified: Secondary | ICD-10-CM | POA: Diagnosis present

## 2023-10-20 DIAGNOSIS — E119 Type 2 diabetes mellitus without complications: Secondary | ICD-10-CM | POA: Diagnosis present

## 2023-10-20 DIAGNOSIS — I110 Hypertensive heart disease with heart failure: Secondary | ICD-10-CM | POA: Diagnosis present

## 2023-10-20 DIAGNOSIS — Z86718 Personal history of other venous thrombosis and embolism: Secondary | ICD-10-CM

## 2023-10-20 DIAGNOSIS — J9601 Acute respiratory failure with hypoxia: Secondary | ICD-10-CM | POA: Diagnosis present

## 2023-10-20 DIAGNOSIS — J449 Chronic obstructive pulmonary disease, unspecified: Secondary | ICD-10-CM | POA: Diagnosis present

## 2023-10-20 DIAGNOSIS — I5032 Chronic diastolic (congestive) heart failure: Secondary | ICD-10-CM | POA: Diagnosis present

## 2023-10-20 LAB — COMPREHENSIVE METABOLIC PANEL WITH GFR
ALT: 14 U/L (ref 0–44)
AST: 22 U/L (ref 15–41)
Albumin: 3.1 g/dL — ABNORMAL LOW (ref 3.5–5.0)
Alkaline Phosphatase: 105 U/L (ref 38–126)
Anion gap: 9 (ref 5–15)
BUN: 10 mg/dL (ref 8–23)
CO2: 40 mmol/L — ABNORMAL HIGH (ref 22–32)
Calcium: 8.5 mg/dL — ABNORMAL LOW (ref 8.9–10.3)
Chloride: 89 mmol/L — ABNORMAL LOW (ref 98–111)
Creatinine, Ser: 0.61 mg/dL (ref 0.61–1.24)
GFR, Estimated: >60 mL/min (ref >60)
Glucose, Bld: 86 mg/dL (ref 70–99)
Potassium: 3.9 mmol/L (ref 3.5–5.1)
Sodium: 138 mmol/L (ref 135–145)
Total Bilirubin: 0.4 mg/dL (ref 0.0–1.2)
Total Protein: 6.4 g/dL — ABNORMAL LOW (ref 6.5–8.1)

## 2023-10-20 LAB — I-STAT VENOUS BLOOD GAS, ED
Acid-Base Excess: 15 mmol/L — ABNORMAL HIGH (ref 0.0–2.0)
Acid-Base Excess: 16 mmol/L — ABNORMAL HIGH (ref 0.0–2.0)
Bicarbonate: 44.3 mmol/L — ABNORMAL HIGH (ref 20.0–28.0)
Bicarbonate: 44.2 mmol/L — ABNORMAL HIGH (ref 20.0–28.0)
Calcium, Ion: 1.09 mmol/L — ABNORMAL LOW (ref 1.15–1.40)
Calcium, Ion: 1.06 mmol/L — ABNORMAL LOW (ref 1.15–1.40)
HCT: 41 % (ref 39.0–52.0)
HCT: 41 % (ref 39.0–52.0)
Hemoglobin: 13.9 g/dL (ref 13.0–17.0)
Hemoglobin: 13.9 g/dL (ref 13.0–17.0)
O2 Saturation: 76 %
O2 Saturation: 81 %
Potassium: 3.9 mmol/L (ref 3.5–5.1)
Potassium: 3.7 mmol/L (ref 3.5–5.1)
Sodium: 137 mmol/L (ref 135–145)
Sodium: 137 mmol/L (ref 135–145)
TCO2: 47 mmol/L — ABNORMAL HIGH (ref 22–32)
TCO2: 46 mmol/L — ABNORMAL HIGH (ref 22–32)
pCO2, Ven: 76.7 mmHg (ref 44–60)
pCO2, Ven: 70.8 mmHg (ref 44–60)
pH, Ven: 7.37 (ref 7.25–7.43)
pH, Ven: 7.404 (ref 7.25–7.43)
pO2, Ven: 45 mmHg (ref 32–45)
pO2, Ven: 48 mmHg — ABNORMAL HIGH (ref 32–45)

## 2023-10-20 LAB — BLOOD GAS, VENOUS
Acid-Base Excess: 21.4 mmol/L — ABNORMAL HIGH (ref 0.0–2.0)
Bicarbonate: 50.4 mmol/L — ABNORMAL HIGH (ref 20.0–28.0)
Drawn by: 8194
O2 Saturation: 98.3 %
Patient temperature: 37.1
pCO2, Ven: 76 mmHg (ref 44–60)
pH, Ven: 7.43 (ref 7.25–7.43)
pO2, Ven: 83 mmHg — ABNORMAL HIGH (ref 32–45)

## 2023-10-20 LAB — CBC WITH DIFFERENTIAL/PLATELET
Abs Immature Granulocytes: 0.07 10*3/uL (ref 0.00–0.07)
Basophils Absolute: 0.1 10*3/uL (ref 0.0–0.1)
Basophils Relative: 1 %
Eosinophils Absolute: 0.3 10*3/uL (ref 0.0–0.5)
Eosinophils Relative: 4 %
HCT: 43.4 % (ref 39.0–52.0)
Hemoglobin: 13.9 g/dL (ref 13.0–17.0)
Immature Granulocytes: 1 %
Lymphocytes Relative: 9 %
Lymphs Abs: 0.7 10*3/uL (ref 0.7–4.0)
MCH: 31 pg (ref 26.0–34.0)
MCHC: 32 g/dL (ref 30.0–36.0)
MCV: 96.9 fL (ref 80.0–100.0)
Monocytes Absolute: 0.9 10*3/uL (ref 0.1–1.0)
Monocytes Relative: 11 %
Neutro Abs: 6.1 10*3/uL (ref 1.7–7.7)
Neutrophils Relative %: 74 %
Platelets: 266 10*3/uL (ref 150–400)
RBC: 4.48 MIL/uL (ref 4.22–5.81)
RDW: 13.9 % (ref 11.5–15.5)
WBC: 8.2 10*3/uL (ref 4.0–10.5)
nRBC: 0 % (ref 0.0–0.2)

## 2023-10-20 LAB — PROTIME-INR
INR: 2.4 — ABNORMAL HIGH (ref 0.8–1.2)
Prothrombin Time: 26.1 s — ABNORMAL HIGH (ref 11.4–15.2)

## 2023-10-20 LAB — TROPONIN I (HIGH SENSITIVITY)
Troponin I (High Sensitivity): 36 ng/L — ABNORMAL HIGH (ref <18)
Troponin I (High Sensitivity): 42 ng/L — ABNORMAL HIGH (ref <18)
Troponin I (High Sensitivity): 41 ng/L — ABNORMAL HIGH (ref <18)
Troponin I (High Sensitivity): 39 ng/L — ABNORMAL HIGH (ref <18)

## 2023-10-20 LAB — BRAIN NATRIURETIC PEPTIDE: B Natriuretic Peptide: 70.7 pg/mL (ref 0.0–100.0)

## 2023-10-20 LAB — I-STAT CHEM 8, ED
BUN: 12 mg/dL (ref 8–23)
Calcium, Ion: 1.1 mmol/L — ABNORMAL LOW (ref 1.15–1.40)
Chloride: 86 mmol/L — ABNORMAL LOW (ref 98–111)
Creatinine, Ser: 0.8 mg/dL (ref 0.61–1.24)
Glucose, Bld: 89 mg/dL (ref 70–99)
HCT: 42 % (ref 39.0–52.0)
Hemoglobin: 14.3 g/dL (ref 13.0–17.0)
Potassium: 3.9 mmol/L (ref 3.5–5.1)
Sodium: 137 mmol/L (ref 135–145)
TCO2: 41 mmol/L — ABNORMAL HIGH (ref 22–32)

## 2023-10-20 LAB — I-STAT CG4 LACTIC ACID, ED: Lactic Acid, Venous: 1.5 mmol/L (ref 0.5–1.9)

## 2023-10-20 LAB — RESP PANEL BY RT-PCR (RSV, FLU A&B, COVID)  RVPGX2
Influenza A by PCR: NEGATIVE
Influenza B by PCR: NEGATIVE
Resp Syncytial Virus by PCR: NEGATIVE
SARS Coronavirus 2 by RT PCR: NEGATIVE

## 2023-10-20 LAB — TYPE AND SCREEN
ABO/RH(D): O POS
Antibody Screen: NEGATIVE

## 2023-10-20 LAB — HIV ANTIBODY (ROUTINE TESTING W REFLEX): HIV Screen 4th Generation wRfx: NONREACTIVE

## 2023-10-20 LAB — PHENYTOIN LEVEL, TOTAL: Phenytoin Lvl: 20.6 ug/mL — ABNORMAL HIGH (ref 10.0–20.0)

## 2023-10-20 MED ORDER — WARFARIN 0.5 MG HALF TABLET
3.5000 mg | ORAL_TABLET | Freq: Every day | ORAL | Status: DC
  Administered 2023-10-21: 3.5 mg via ORAL
  Filled 2023-10-20 (×2): qty 1

## 2023-10-20 MED ORDER — ROSUVASTATIN CALCIUM 20 MG PO TABS
40.0000 mg | ORAL_TABLET | Freq: Every day | ORAL | Status: DC
  Administered 2023-10-20 – 2023-10-22 (×3): 40 mg via ORAL
  Filled 2023-10-20 (×3): qty 2

## 2023-10-20 MED ORDER — PHENYTOIN 50 MG PO CHEW
50.0000 mg | CHEWABLE_TABLET | Freq: Every day | ORAL | Status: DC
  Administered 2023-10-20 – 2023-10-22 (×3): 50 mg via ORAL
  Filled 2023-10-20 (×5): qty 1

## 2023-10-20 MED ORDER — CYCLOSPORINE 0.05 % OP EMUL
1.0000 [drp] | Freq: Two times a day (BID) | OPHTHALMIC | Status: DC
  Administered 2023-10-20 – 2023-10-23 (×5): 1 [drp] via OPHTHALMIC
  Filled 2023-10-20 (×8): qty 30

## 2023-10-20 MED ORDER — CARBOXYMETH-GLYCERIN-POLYSORB 0.5-1-0.5 % OP SOLN: 1.0000 [drp] | Freq: Four times a day (QID) | OPHTHALMIC | Status: DC

## 2023-10-20 MED ORDER — TRAZODONE HCL 50 MG PO TABS
50.0000 mg | ORAL_TABLET | Freq: Every day | ORAL | Status: DC
  Administered 2023-10-20 – 2023-10-22 (×3): 50 mg via ORAL
  Filled 2023-10-20 (×3): qty 1

## 2023-10-20 MED ORDER — PHENYTOIN SODIUM EXTENDED 100 MG PO CAPS
200.0000 mg | ORAL_CAPSULE | Freq: Two times a day (BID) | ORAL | Status: DC
  Administered 2023-10-20 – 2023-10-23 (×6): 200 mg via ORAL
  Filled 2023-10-20 (×8): qty 2

## 2023-10-20 MED ORDER — PANTOPRAZOLE SODIUM 40 MG PO TBEC
40.0000 mg | DELAYED_RELEASE_TABLET | Freq: Every day | ORAL | Status: DC
  Administered 2023-10-20 – 2023-10-23 (×4): 40 mg via ORAL
  Filled 2023-10-20 (×4): qty 1

## 2023-10-20 MED ORDER — WARFARIN - PHARMACIST DOSING INPATIENT: Freq: Every day | Status: DC

## 2023-10-20 MED ORDER — ACETAMINOPHEN 325 MG PO TABS
650.0000 mg | ORAL_TABLET | Freq: Four times a day (QID) | ORAL | Status: DC | PRN
  Administered 2023-10-20: 650 mg via ORAL
  Filled 2023-10-20: qty 2

## 2023-10-20 MED ORDER — VENLAFAXINE HCL ER 37.5 MG PO CP24
37.5000 mg | ORAL_CAPSULE | Freq: Every day | ORAL | Status: DC
  Filled 2023-10-20: qty 1

## 2023-10-20 MED ORDER — EMPAGLIFLOZIN 10 MG PO TABS
10.0000 mg | ORAL_TABLET | Freq: Every day | ORAL | Status: DC
  Administered 2023-10-21 – 2023-10-23 (×3): 10 mg via ORAL
  Filled 2023-10-20 (×3): qty 1

## 2023-10-20 MED ORDER — EZETIMIBE 10 MG PO TABS
10.0000 mg | ORAL_TABLET | Freq: Every day | ORAL | Status: DC
  Administered 2023-10-21 – 2023-10-23 (×3): 10 mg via ORAL
  Filled 2023-10-20 (×3): qty 1

## 2023-10-20 MED ORDER — FUROSEMIDE 10 MG/ML IJ SOLN
80.0000 mg | Freq: Once | INTRAMUSCULAR | Status: AC
  Administered 2023-10-20: 80 mg via INTRAVENOUS
  Filled 2023-10-20: qty 8

## 2023-10-20 MED ORDER — POLYVINYL ALCOHOL 1.4 % OP SOLN
1.0000 [drp] | Freq: Four times a day (QID) | OPHTHALMIC | Status: DC
  Administered 2023-10-20 – 2023-10-23 (×7): 1 [drp] via OPHTHALMIC
  Filled 2023-10-20 (×2): qty 15

## 2023-10-20 MED ORDER — DULOXETINE HCL 30 MG PO CPEP
30.0000 mg | ORAL_CAPSULE | Freq: Every day | ORAL | Status: DC
  Administered 2023-10-20 – 2023-10-22 (×3): 30 mg via ORAL
  Filled 2023-10-20 (×3): qty 1

## 2023-10-20 NOTE — ED Notes (Signed)
 Lab called and asked to add Phenytoin  level to labs already sent down.

## 2023-10-20 NOTE — ED Notes (Signed)
 Phlebotomy asked to draw 2nd set of blood cultures.

## 2023-10-20 NOTE — Progress Notes (Signed)
 Received signout on this patient with request to check on him again after he had been off BiPAP.  We saw him around 7:30 PM and he was doing well off BiPAP and on 4 L supplemental oxygen through nasal cannula.  He continues to have good urine output after getting IV Lasix  80 mg at about 4 PM today.  I was able to turn on his oxygen to 2 L with persistent oxygen saturations above 92 and even had him on room air for a few minutes but he did drop to about 89 without any exertion.  We will continue with supplemental oxygen through the nasal cannula and put in CPAP at night.  No plan to redose Lasix  tonight.  James Dallas, DO Internal Medicine Resident, PGY-2 Please contact the on call pager at [phone] for any urgent or emergent needs. 9:09 PM 10/20/2023

## 2023-10-20 NOTE — H&P (Cosign Needed Addendum)
 Date: 10/20/2023               Patient Name:  James Villegas MRN: [id number]  DOB: [birth year] Age / Sex: 68 y.o., male   PCP: Hoover Luz, MD              Medical Service: Internal Medicine Teaching Service              Attending Physician: Dr. Sandie Cross, MD    First Contact: Pati Bonine, MS4 Pager: [phone]   Second Contact: Dr. Cathey Clunes, MD Pager: [phone]       After Hours (After 5p/  First Contact Pager: [phone]  weekends / holidays): Second Contact Pager: [phone]   Chief Complaint: Diaphoresis and generalized weakness  History of Present Illness:  James Villegas is a 68 y.o. male with PMHx of CVA with right sided hemiplegia, Afib/flutter (on Warfarin), chronic diastolic HF, chronic respiratory failure with hypercarbia, HLD, HTN, epilepsy, protein C deficiency, lupus anticoagulant, unspecified mood disorder and obesity who presented from [facility] for diaphoresis and generalized weakness since this morning. Patient uses 4-5L [place] at home and CPAP at night, but admits to it not fitting properly. He reports taking all of his medications as prescribed, but has not taken any today. At baseline, he is dependent in ADLs and iADLs with complete loss of strength on the right side of his body after CVA 3 years ago. He is able to feed himself with his left hand, but requires assistance with bathing, repositioning and urinates into depends. He does not have trouble with chewing or swallowing. He denies chest pain, shortness of breath or palpitations. Also denies nausea, vomiting, abdominal pain, diarrhea, hematochezia, melena, dysuria, hematuria, fevers or chills.   [facility] facility contacted twice for further information on patient's baseline status without success. Previously, patient was Full Code, but would like to change his code status during this admission. He wishes for no CPR or intubation but is okay with BiPAP therapy. This was confirmed  with sister James Villegas who lives in [place], [place] [phone].   In the ED: Patient arrived to the ED via EMS an noted to have mild increased work of breathing , mild CO2 retention seen on VBG and was placed on BiPAP with improvement in breathing and diaphoresis. Patient received 80 mg Lasix , with improvement in oxygenation and IMTS was paged for admission.  Problem list:  From [facility] list: Chronic diastolic heart failure Chronic respiratory hypoxic hypercapnic failure on 3-4L Oxygen s/p COVID 2019 Epilepsy HLD HTN Hx VTE Atrial fibrillation CVA with R hemiparesis Affective mood disorder GERD with esophagitis Morbid obesity  Meds:  Tylenol  650 mg PRN for pain PO Baclofen  5 mg at night for muscle spasticity Biotene throat gel PO Biscodyl tablet 5 mg 2 tablets q24hr Cyclosporine  Emulsion 0.05%, 1 drop in eyes due to inflammation PO Cymbalta 30 mg delayed release for depression PO Phenytoin  100mg  (2 tablets) BID Phenytoin  chewable table 50 mg at bedtime, give with 200mg  capsule for total 250mg  at bedtime PO Ezetimibe  10 mg daily PO Losartan 25 mg PO Jardiance  10 mg daily  PO Potassium Chloride  ER tablet 20 mg daily Carboxymethylcellulose-Glycerin-Polysorbate 0.5% ophthalmic solution PO Rosuvastatin  40 mg daily PO Toresemide 20 mg (2 tablets) BID PO Spironolactone  25 mg daily PO Trazadone HCl 50 mg at bedtime PO Warfarin 3.5 mg daily  Allergies: Allergies as of 10/20/2023 - Review Complete 10/20/2023  Allergen Reaction Noted   Fenofibrate Other (See Comments)  02/18/2011   Past Medical History:  Diagnosis Date   Acute on chronic congestive heart failure (HCC)    Anemia, secondary    SECONDARY TO ACUTE BLOOD LOSS   Anxiety disorder    Bloody stool 08/29/2011   intermittent along with constipation.    CVA (cerebral vascular accident) (HCC) 2010   large left MCA stroke with right hemiparesis   Depression    DVT of lower extremity (deep venous thrombosis) (HCC)    RIGHT  LOWER; s/p IVC filter 7/12   Dysphagia    Hyperlipidemia    Hypertension    Lupus anticoagulant disorder (HCC) 1990   Morbid obesity (HCC)    PFO (patent foramen ovale)    TEE 2/10: EF 60%, trivial AI, mild Ao root dilatation, mod PFO with R-L shunting, atrial septal aneurysm;   echo 7/12: EF 65-70%, grade 1 diast dysfxn, mild MR, LVOT showed severe obstruction   Protein C deficiency (HCC)    Protein S deficiency (HCC)    Rectus sheath hematoma 7/12   required reversal of anticoagulation and c/b DVT req. IVC filter   Seizure disorder [facility])    Family History: N/A  Social History:  Lives at [facility] nursing home. Sister is in contact with facility and patient Dependent in ADLs/iADLs PCP: Hoover Luz, MD  Review of Systems: A complete ROS was negative except as per HPI.   Physical Exam: Blood pressure 127/77, pulse 96, temperature 99.8 F (37.7 C), temperature source Rectal, resp. rate 20, height 6\' 1"  (1.854 m), weight 133.8 kg, SpO2 100%.  Constitutional: Laying in bed, no acute distress HENT: normocephalic atraumatic, mucous membranes moist Cardiovascular: Regular rate and rhythm, JVD unable to assess due to body habitus Pulmonary/Chest: Auscultated anteriorly, poor respiratory effort, no crackles/rhonchi.  Abdominal: soft, non-tender, non-distended Neurological: alert & oriented x 3 MSK: Right sided upper and lower strength 0/5, left sided upper and lower strength 5/5 Extremities: 1+ pitting edema b/l LE Psych: Normal mood and affect  Relevant labs: CBC w/ diff unremarkable, INR 2.4, troponin mildly elevated at 36 > 42, CMP with CO2 40, total protein 6.4, albumin 3.1, VBG before placed on BiPAP with pH 7.370, pCO2 76.7, O2 45, CO2 41-47 and Bicarb 44. Lactate normal at 1.5, RVP negative. BNP, 20 panel RPP and blood cultures pending.   Imaging: EKG: RBBB with posterior fascicular block with afib, consistent with prior on Oct 19, 2022.  CXR: FINDINGS: Low lung  volume. Bilateral lung fields are clear. Bilateral costophrenic angles are clear. Note is made of elevated right hemidiaphragm. Stable cardio-mediastinal silhouette. No acute osseous abnormalities.   Electronically Signed   By: Beula Brunswick M.D.   On: 10/20/2023 13:42    Assessment & Plan by Problem: Principal Problem:   Acute hypoxic respiratory failure (HCC) Active Problems:   Cerebral infarction (HCC)   DVT (deep venous thrombosis) (HCC)   Permanent atrial fibrillation (HCC)   Seizure disorder (HCC)   History of CVA with residual deficit  Acute on Chronic Hypoxic Hypercarbic respiratory failure Suspected HFpEF exacerbation Etiology of acute hypoxic hypercarbia is unknown at this time. DDx includes HF exacerbation and infection. CXR in ED was clear, making PNA unlikely and initial RVP was also negative, but a more extensive 20 panel is pending. On 4-5L [place] at home. Initial VBG before placed on BiPAP with pH 7.370, pCO2 76.7, O2 45, CO2 41-47 and Bicarb 44. Lactate normal at 1.5. VBG improved slightly to pH 7.404, pCO2 70.8, O2 48 after BiPAP. Received 80  mg Lasix  in the ED. On Torsemide  40 mg BID and Spiro 45 mg at home. - Wean BiPAP as tolerated, on 4-5L [place] at home - s/p 80 mg IV lasix , consider additional 80 mg IV Lasix  if further dieresis needed overnight - Hold home Spironolactone  while on IV lasix  - Continue PO Jardiance  10 mg daily  - Echo ordered - BNP pending - Telemetry - Bcx collected and pending - UA needs to be collected - Holding home spironolactone  in the setting of significant  - Consider giving additional 80 mg of Lasix  overnight if still volume overloaded - 20 panel RVP ordered - Droplet precautions - Trend CBC and CMP - Condom catheter - Strict I&Os, daily weights - CPAP at night - SLP consult  Atrial Fibrillation/Atrial Flutter EKG in ED with RBBB with posterior fascicular block with afib, consistent with prior on Oct 19, 2022. On Warfarin at home, INR  in ED 2.4. - Warfarin per pharmacy - Monitor CBC   Troponin elevation No current or recent angina. 36-> 42 on admission. Likely in setting of demand ischemia. EKG similar to prior.  - Trend  HTN - Holding home Losartan in the setting of normal pressures  Protein C&S Deficiency Lupus Anticoagulant On Warfarin at home for Afib, protein C&S and LA. INR in ED 2.4. Denies recent falls, hematochezia, melena, hematuria, or any other concern for an acute bleed. Goal INR for protein C&S deficiency is 2-3, therefore INR is at goal.  - Continue home warfarin per pharmacy  Epilepsy - Phenytoin  level ordered - Continue home Phenytoin  pending level  Depression - Continue home Cymbalta 30 mg - Continue home Trazadone 50 mg qhs  Dispo: Admit patient to Inpatient with expected length of stay greater than 2 midnights.  Signed: [name], MS4 10/20/23   Attestation for Student Documentation:  I personally was present and re-performed the history, physical exam and medical decision-making activities of this service and have verified that the service and findings are accurately documented in the student's note.  Cathey Clunes, MD 10/20/2023, 6:45 PM

## 2023-10-20 NOTE — ED Triage Notes (Signed)
 Patient BIB EMS from [facility] c/o generalized weakness. Staff at the nursing home states that he was very diaphoretic and pale this AM.

## 2023-10-20 NOTE — ED Provider Notes (Signed)
 [facility] EMERGENCY DEPARTMENT AT [facility] Provider Note   CSN: [id number] Arrival date & time: 10/20/23  1305     History  Chief Complaint  Patient presents with   Generalized Weakness    James Villegas is a 68 y.o. male.  68 year old male with prior medical history as detailed below presents from [facility] nursing home for evaluation of generalized weakness.  Patient reports that he feels weak.  He denies chest pain or shortness of breath.  He is on his baseline O2 requirement which is 4 L nasal cannula.  He denies nausea or vomiting.  He denies abdominal pain.  He denies fever.  He reports feeling sweaty throughout the course of the morning.   Prior medical history includes morbid obesity, chronic respiratory failure, epilepsy, hemiplegia status post CVA, atrial fibrillation on Coumadin .  The history is provided by the patient.       Home Medications Prior to Admission medications   Medication Sig Start Date End Date Taking? Authorizing Provider  acetaminophen  (TYLENOL ) 325 MG tablet Take 650 mg by mouth every 6 (six) hours as needed for mild pain, headache or fever.    [provider]  Carboxymeth-Glycerin-Polysorb (REFRESH OPTIVE ADVANCED) 0.5-1-0.5 % SOLN Place 1 drop into both eyes in the morning, at noon, in the evening, and at bedtime. Wait 3-5 minutes between eye medications    [provider]  cycloSPORINE  (RESTASIS ) 0.05 % ophthalmic emulsion Place 1 drop into both eyes 2 (two) times daily.    [provider]  empagliflozin  (JARDIANCE ) 10 MG TABS tablet Take by mouth daily.    [provider]  ezetimibe  (ZETIA ) 10 MG tablet Take 10 mg by mouth daily.    [provider]  losartan (COZAAR) 25 MG tablet Take 12.5 mg by mouth daily.    [provider]  omeprazole  (PRILOSEC) 20 MG capsule Take 20 mg by mouth daily. 08/01/21   [provider]  OXYGEN Inhale into the lungs See admin  instructions. Nightly 9p-8a    [provider]  phenytoin  (DILANTIN ) 100 MG ER capsule Take 200 mg by mouth 2 (two) times daily.    [provider]  phenytoin  (DILANTIN ) 50 MG tablet Chew 50 mg by mouth at bedtime. Give with 200mg  capsules for total dose of 250mg  qhs    [provider]  potassium chloride  SA (KLOR-CON ) 20 MEQ tablet Take 2 tablets (40 mEq total) by mouth daily. 08/08/20   Audria Leather, MD  rosuvastatin  (CRESTOR ) 40 MG tablet Take 40 mg by mouth at bedtime.    [provider]  spironolactone  (ALDACTONE ) 25 MG tablet Take 1 tablet (25 mg total) by mouth daily. 09/22/21 10/10/22  ShahmehdiConstantino Demark, MD  torsemide  40 MG TABS Take 40 mg by mouth 2 (two) times daily. 08/26/22   Singh, Prashant K, MD  traZODone  (DESYREL ) 50 MG tablet Take 50 mg by mouth at bedtime. 08/01/21   [provider]  venlafaxine  XR (EFFEXOR -XR) 37.5 MG 24 hr capsule Take 37.5 mg by mouth at bedtime.    [provider]  warfarin (COUMADIN ) 2 MG tablet Take 2 mg by mouth daily. Take with 2.5mg  tablet for total dose of 4.5mg  08/05/21   [provider]  warfarin (COUMADIN ) 2.5 MG tablet Take 2.5 mg by mouth daily. Give with the 2mg  to equal 4.5mg     [provider]      Allergies    Fenofibrate    Review of Systems  Review of Systems  All other systems reviewed and are negative.   Physical Exam Updated Vital Signs BP (!) 134/91   Pulse 94   Temp 99.8 F (37.7 C) (Rectal)   Resp 15   Ht 6\' 1"  (1.854 m)   Wt 133.8 kg   SpO2 100%   BMI 38.92 kg/m  Physical Exam Vitals and nursing note reviewed.  Constitutional:      General: He is not in acute distress.    Appearance: He is well-developed.     Comments: Alert, appears uncomfortable, diaphoretic  HENT:     Head: Normocephalic and atraumatic.  Eyes:     Conjunctiva/sclera: Conjunctivae normal.     Pupils: Pupils are equal, round, and reactive to light.  Cardiovascular:     Rate  and Rhythm: Normal rate and regular rhythm.     Heart sounds: Normal heart sounds.  Pulmonary:     Effort: Respiratory distress present.     Comments: Mild increased work of breathing, mild tachypnea, decreased breath sounds at bases Abdominal:     General: There is no distension.     Palpations: Abdomen is soft.     Tenderness: There is no abdominal tenderness.  Musculoskeletal:        General: No deformity. Normal range of motion.     Cervical back: Normal range of motion and neck supple.  Skin:    General: Skin is warm.     Comments: Diaphoretic  Neurological:     General: No focal deficit present.     Mental Status: He is alert and oriented to person, place, and time.     ED Results / Procedures / Treatments   Labs (all labs ordered are listed, but only abnormal results are displayed) Labs Reviewed  I-STAT CHEM 8, ED - Abnormal; Notable for the following components:      Result Value   Chloride 86 (*)    Calcium , Ion 1.10 (*)    TCO2 41 (*)    All other components within normal limits  I-STAT VENOUS BLOOD GAS, ED - Abnormal; Notable for the following components:   pCO2, Ven 76.7 (*)    Bicarbonate 44.3 (*)    TCO2 47 (*)    Acid-Base Excess 15.0 (*)    Calcium , Ion 1.09 (*)    All other components within normal limits  CULTURE, BLOOD (ROUTINE X 2)  CULTURE, BLOOD (ROUTINE X 2)  RESP PANEL BY RT-PCR (RSV, FLU A&B, COVID)  RVPGX2  CBC WITH DIFFERENTIAL/PLATELET  PROTIME-INR  COMPREHENSIVE METABOLIC PANEL WITH GFR  URINALYSIS, W/ REFLEX TO CULTURE (INFECTION SUSPECTED)  BRAIN NATRIURETIC PEPTIDE  I-STAT CG4 LACTIC ACID, ED  TYPE AND SCREEN  TROPONIN I (HIGH SENSITIVITY)    EKG EKG Interpretation Date/Time:  Tuesday Oct 20 2023 13:08:52 EDT Ventricular Rate:  89 PR Interval:    QRS Duration:  136 QT Interval:  392 QTC Calculation: 477 R Axis:   94  Text Interpretation: Atrial fibrillation RBBB and LPFB Repol abnrm suggests ischemia, diffuse leads Confirmed  by Angela Kell [phone]) on 10/20/2023 1:24:52 PM  Radiology DG Chest Port 1 View Result Date: 10/20/2023 CLINICAL DATA:  Shortness of breath. EXAM: PORTABLE CHEST 1 VIEW COMPARISON:  08/21/2022. FINDINGS: Low lung volume. Bilateral lung fields are clear. Bilateral costophrenic angles are clear. Note is made of elevated right hemidiaphragm. Stable cardio-mediastinal silhouette. No acute osseous abnormalities. The soft tissues are within normal limits. IMPRESSION: No active disease. Electronically Signed   By: Beula Brunswick  M.D.   On: 10/20/2023 13:42    Procedures Procedures    Medications Ordered in ED Medications - No data to display  ED Course/ Medical Decision Making/ A&P                                 Medical Decision Making Amount and/or Complexity of Data Reviewed Labs: ordered. Radiology: ordered.    Medical Screen Complete  This patient presented to the ED with complaint of weakness,.  This complaint involves an extensive number of treatment options. The initial differential diagnosis includes, but is not limited to, metabolic abnormality, pneumonia, CHF, etc.  This presentation is: Acute, Chronic, Self-Limited, Previously Undiagnosed, Uncertain Prognosis, Complicated, Systemic Symptoms, and Threat to Life/Bodily Function  Patient presents with complaint of generalized fatigue and being unwell.  Patient has noted to have mild increased work of breathing, mild CO2 retention seen on VBG.  Patient placed on BiPAP with improvement in his symptoms.  Patient with normal white count at 8.2.  INR is 2.4 and appropriately therapeutic.  Presentation is consistent with likely CHF exacerbation.  Patient would benefit from admission.  Oncoming EDP is aware of case and need to contact admitting service.  Additional history obtained:  External records from outside sources obtained and reviewed including prior ED visits and prior Inpatient records.   Problem List / ED  Course:  Dyspnea, hypercarbia, suspected CHF exacerbation    Disposition:  After consideration of the diagnostic results and the patients response to treatment, I feel that the patent would benefit from likely admission.   CRITICAL CARE Performed by: Burnette Carte   Total critical care time: 30 minutes  Critical care time was exclusive of separately billable procedures and treating other patients.  Critical care was necessary to treat or prevent imminent or life-threatening deterioration.  Critical care was time spent personally by me on the following activities: development of treatment plan with patient and/or surrogate as well as nursing, discussions with consultants, evaluation of patient's response to treatment, examination of patient, obtaining history from patient or surrogate, ordering and performing treatments and interventions, ordering and review of laboratory studies, ordering and review of radiographic studies, pulse oximetry and re-evaluation of patient's condition.         Final Clinical Impression(s) / ED Diagnoses Final diagnoses:  Dyspnea, unspecified type    Rx / DC Orders ED Discharge Orders     None         Burnette Carte, MD 10/20/23 1551

## 2023-10-20 NOTE — Progress Notes (Signed)
 ANTICOAGULATION CONSULT NOTE  Pharmacy Consult for Warfarin Indication: atrial fibrillation and protein C , S deficiency and lupus anticoagulant history  Allergies  Allergen Reactions   Fenofibrate Other (See Comments)    Per [facility]    Patient Measurements: Height: 6\' 1"  (185.4 cm) Weight: 133.8 kg (295 lb) IBW/kg (Calculated) : 79.9  Vital Signs: Temp: 99.8 F (37.7 C) (05/13 1409) Temp Source: Rectal (05/13 1409) BP: 121/77 (05/13 1700) Pulse Rate: 52 (05/13 1734)  Labs: Recent Labs    10/20/23 1324 10/20/23 1331 10/20/23 1524 10/20/23 1719  HGB 13.9 13.9  14.3  --  13.9  HCT 43.4 41.0  42.0  --  41.0  PLT 266  --   --   --   LABPROT 26.1*  --   --   --   INR 2.4*  --   --   --   CREATININE 0.61 0.80  --   --   TROPONINIHS 36*  --  42*  --     Estimated Creatinine Clearance: 132.2 mL/min (by C-G formula based on SCr of 0.8 mg/dL).   Medical History: Past Medical History:  Diagnosis Date   Acute on chronic congestive heart failure (HCC)    Anemia, secondary    SECONDARY TO ACUTE BLOOD LOSS   Anxiety disorder    Bloody stool 08/29/2011   intermittent along with constipation.    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 IVC filter   Seizure disorder (HCC)     Medications:  (Not in a hospital admission)  Scheduled:    Carboxymeth-Glycerin-Polysorb  1 drop Both Eyes QID   cycloSPORINE   1 drop Both Eyes BID   empagliflozin   10 mg Oral Daily   ezetimibe   10 mg Oral Daily   pantoprazole   40 mg Oral Daily   phenytoin   200 mg Oral BID   rosuvastatin   40 mg Oral QHS   venlafaxine  XR  37.5 mg Oral QHS   Infusions:  PRN: acetaminophen   Assessment: 67 yom with a history of CVA with right sided hemiplegia, Afib/flutter (on Warfarin), HF, chronic respiratory failure, HLD, HTN, epilepsy, protein C deficiency, lupus anticoagulant, obesity. Patient is presenting from [facility] with generalized weakness. Warfarin per pharmacy consult placed for atrial fibrillation and protein C , S deficiency and lupus anticoagulant history.  Patient taking warfarin prior to arrival. Home warfarin dose is 3.5mg  daily. Last taken 5/12 ~1700.  PT / INR today is 26.1 / 2.4, which is therapeutic Hgb 14.3; plt 266  Goal of Therapy:  INR Goal 2-3 Monitor platelets by anticoagulation protocol: Yes   Plan:  Continue warfarin 3.5 mg daily Monitor for s/s of hemorrhage, daily INR, CBC Watch for new DDIs  Dionicio Fray, PharmD, BCPS 10/20/2023 5:40 PM  ED Clinical Pharmacist -  [phone]

## 2023-10-21 ENCOUNTER — Observation Stay (HOSPITAL_BASED_OUTPATIENT_CLINIC_OR_DEPARTMENT_OTHER)

## 2023-10-21 DIAGNOSIS — J9601 Acute respiratory failure with hypoxia: Secondary | ICD-10-CM

## 2023-10-21 DIAGNOSIS — J9602 Acute respiratory failure with hypercapnia: Secondary | ICD-10-CM | POA: Diagnosis not present

## 2023-10-21 DIAGNOSIS — I4821 Permanent atrial fibrillation: Secondary | ICD-10-CM | POA: Diagnosis present

## 2023-10-21 DIAGNOSIS — G40909 Epilepsy, unspecified, not intractable, without status epilepticus: Secondary | ICD-10-CM | POA: Diagnosis present

## 2023-10-21 DIAGNOSIS — R0600 Dyspnea, unspecified: Secondary | ICD-10-CM

## 2023-10-21 DIAGNOSIS — I5033 Acute on chronic diastolic (congestive) heart failure: Secondary | ICD-10-CM | POA: Diagnosis not present

## 2023-10-21 DIAGNOSIS — Z1152 Encounter for screening for COVID-19: Secondary | ICD-10-CM | POA: Diagnosis not present

## 2023-10-21 DIAGNOSIS — Z6838 Body mass index (BMI) 38.0-38.9, adult: Secondary | ICD-10-CM | POA: Diagnosis not present

## 2023-10-21 DIAGNOSIS — I5032 Chronic diastolic (congestive) heart failure: Secondary | ICD-10-CM | POA: Diagnosis present

## 2023-10-21 DIAGNOSIS — J9622 Acute and chronic respiratory failure with hypercapnia: Secondary | ICD-10-CM | POA: Diagnosis present

## 2023-10-21 DIAGNOSIS — G9349 Other encephalopathy: Secondary | ICD-10-CM | POA: Diagnosis present

## 2023-10-21 DIAGNOSIS — E8729 Other acidosis: Secondary | ICD-10-CM | POA: Diagnosis present

## 2023-10-21 DIAGNOSIS — N390 Urinary tract infection, site not specified: Secondary | ICD-10-CM | POA: Diagnosis present

## 2023-10-21 DIAGNOSIS — I110 Hypertensive heart disease with heart failure: Secondary | ICD-10-CM | POA: Diagnosis present

## 2023-10-21 DIAGNOSIS — G9340 Encephalopathy, unspecified: Secondary | ICD-10-CM | POA: Diagnosis not present

## 2023-10-21 DIAGNOSIS — Q2112 Patent foramen ovale: Secondary | ICD-10-CM | POA: Diagnosis not present

## 2023-10-21 DIAGNOSIS — E119 Type 2 diabetes mellitus without complications: Secondary | ICD-10-CM | POA: Diagnosis present

## 2023-10-21 DIAGNOSIS — I69351 Hemiplegia and hemiparesis following cerebral infarction affecting right dominant side: Secondary | ICD-10-CM | POA: Diagnosis not present

## 2023-10-21 DIAGNOSIS — B9620 Unspecified Escherichia coli [E. coli] as the cause of diseases classified elsewhere: Secondary | ICD-10-CM | POA: Diagnosis present

## 2023-10-21 DIAGNOSIS — L89151 Pressure ulcer of sacral region, stage 1: Secondary | ICD-10-CM | POA: Diagnosis present

## 2023-10-21 DIAGNOSIS — R7989 Other specified abnormal findings of blood chemistry: Secondary | ICD-10-CM | POA: Diagnosis present

## 2023-10-21 DIAGNOSIS — Z9981 Dependence on supplemental oxygen: Secondary | ICD-10-CM | POA: Diagnosis not present

## 2023-10-21 DIAGNOSIS — F32A Depression, unspecified: Secondary | ICD-10-CM | POA: Diagnosis present

## 2023-10-21 DIAGNOSIS — D6862 Lupus anticoagulant syndrome: Secondary | ICD-10-CM | POA: Diagnosis present

## 2023-10-21 DIAGNOSIS — E662 Morbid (severe) obesity with alveolar hypoventilation: Secondary | ICD-10-CM | POA: Diagnosis present

## 2023-10-21 DIAGNOSIS — Z7901 Long term (current) use of anticoagulants: Secondary | ICD-10-CM | POA: Diagnosis not present

## 2023-10-21 DIAGNOSIS — Z8616 Personal history of COVID-19: Secondary | ICD-10-CM | POA: Diagnosis not present

## 2023-10-21 DIAGNOSIS — J449 Chronic obstructive pulmonary disease, unspecified: Secondary | ICD-10-CM | POA: Diagnosis present

## 2023-10-21 DIAGNOSIS — M62838 Other muscle spasm: Secondary | ICD-10-CM | POA: Diagnosis not present

## 2023-10-21 LAB — URINALYSIS, W/ REFLEX TO CULTURE (INFECTION SUSPECTED)
Bilirubin Urine: NEGATIVE
Glucose, UA: >=500 mg/dL — AB
Ketones, ur: NEGATIVE mg/dL
Nitrite: NEGATIVE
Protein, ur: NEGATIVE mg/dL
Specific Gravity, Urine: 1.014 (ref 1.005–1.030)
WBC, UA: >50 WBC/hpf (ref 0–5)
pH: 7 (ref 5.0–8.0)

## 2023-10-21 LAB — COMPREHENSIVE METABOLIC PANEL WITH GFR
ALT: 14 U/L (ref 0–44)
AST: 19 U/L (ref 15–41)
Albumin: 3.1 g/dL — ABNORMAL LOW (ref 3.5–5.0)
Alkaline Phosphatase: 102 U/L (ref 38–126)
Anion gap: 7 (ref 5–15)
BUN: 9 mg/dL (ref 8–23)
CO2: 41 mmol/L — ABNORMAL HIGH (ref 22–32)
Calcium: 8.7 mg/dL — ABNORMAL LOW (ref 8.9–10.3)
Chloride: 90 mmol/L — ABNORMAL LOW (ref 98–111)
Creatinine, Ser: 0.61 mg/dL (ref 0.61–1.24)
GFR, Estimated: >60 mL/min (ref >60)
Glucose, Bld: 124 mg/dL — ABNORMAL HIGH (ref 70–99)
Potassium: 3.5 mmol/L (ref 3.5–5.1)
Sodium: 138 mmol/L (ref 135–145)
Total Bilirubin: 0.6 mg/dL (ref 0.0–1.2)
Total Protein: 6.3 g/dL — ABNORMAL LOW (ref 6.5–8.1)

## 2023-10-21 LAB — RESPIRATORY PANEL BY PCR

## 2023-10-21 LAB — BLOOD CULTURE ID PANEL (REFLEXED) - BCID2

## 2023-10-21 LAB — PROTIME-INR
INR: 2.1 — ABNORMAL HIGH (ref 0.8–1.2)
Prothrombin Time: 24 s — ABNORMAL HIGH (ref 11.4–15.2)

## 2023-10-21 LAB — ECHOCARDIOGRAM COMPLETE
AR max vel: 3.08 cm2
AV Area VTI: 2.83 cm2
AV Area mean vel: 2.7 cm2
AV Mean grad: 5 mmHg
AV Peak grad: 9 mmHg
Ao pk vel: 1.5 m/s
Area-P 1/2: 3.54 cm2
Height: 73 in
P 1/2 time: 837 ms
S' Lateral: 3.8 cm
Weight: 4710.4 [oz_av]

## 2023-10-21 LAB — CBC
HCT: 41.6 % (ref 39.0–52.0)
Hemoglobin: 13.4 g/dL (ref 13.0–17.0)
MCH: 30.9 pg (ref 26.0–34.0)
MCHC: 32.2 g/dL (ref 30.0–36.0)
MCV: 96.1 fL (ref 80.0–100.0)
Platelets: 224 10*3/uL (ref 150–400)
RBC: 4.33 MIL/uL (ref 4.22–5.81)
RDW: 14.2 % (ref 11.5–15.5)
WBC: 6 10*3/uL (ref 4.0–10.5)
nRBC: 0 % (ref 0.0–0.2)

## 2023-10-21 LAB — BLOOD GAS, VENOUS
Acid-Base Excess: 18.4 mmol/L — ABNORMAL HIGH (ref 0.0–2.0)
Acid-Base Excess: 18.6 mmol/L — ABNORMAL HIGH (ref 0.0–2.0)
Acid-Base Excess: 19.9 mmol/L — ABNORMAL HIGH (ref 0.0–2.0)
Acid-Base Excess: 21.2 mmol/L — ABNORMAL HIGH (ref 0.0–2.0)
Bicarbonate: 47.8 mmol/L — ABNORMAL HIGH (ref 20.0–28.0)
Bicarbonate: 48.3 mmol/L — ABNORMAL HIGH (ref 20.0–28.0)
Bicarbonate: 49.1 mmol/L — ABNORMAL HIGH (ref 20.0–28.0)
Bicarbonate: 49.6 mmol/L — ABNORMAL HIGH (ref 20.0–28.0)
Drawn by: 60026
Drawn by: 60076
Drawn by: 8168
Drawn by: 8186
O2 Saturation: 87.4 %
O2 Saturation: 90.8 %
O2 Saturation: 98.8 %
O2 Saturation: 99.4 %
Patient temperature: 36.4
Patient temperature: 36.7
Patient temperature: 36.9
Patient temperature: 37
pCO2, Ven: 61 mmHg — ABNORMAL HIGH (ref 44–60)
pCO2, Ven: 79 mmHg (ref 44–60)
pCO2, Ven: 80 mmHg (ref 44–60)
pCO2, Ven: 89 mmHg (ref 44–60)
pH, Ven: 7.35 (ref 7.25–7.43)
pH, Ven: 7.39 (ref 7.25–7.43)
pH, Ven: 7.4 (ref 7.25–7.43)
pH, Ven: 7.5 — ABNORMAL HIGH (ref 7.25–7.43)
pO2, Ven: 53 mmHg — ABNORMAL HIGH (ref 32–45)
pO2, Ven: 55 mmHg — ABNORMAL HIGH (ref 32–45)
pO2, Ven: 83 mmHg — ABNORMAL HIGH (ref 32–45)
pO2, Ven: 96 mmHg — ABNORMAL HIGH (ref 32–45)

## 2023-10-21 LAB — MRSA NEXT GEN BY PCR, NASAL: MRSA by PCR Next Gen: DETECTED — AB

## 2023-10-21 MED ORDER — GERHARDT'S BUTT CREAM
TOPICAL_CREAM | CUTANEOUS | Status: DC | PRN
Start: 2023-10-21 — End: 2023-10-23
  Filled 2023-10-21: qty 60

## 2023-10-21 MED ORDER — TORSEMIDE 20 MG PO TABS
40.0000 mg | ORAL_TABLET | Freq: Two times a day (BID) | ORAL | Status: DC
  Administered 2023-10-21 – 2023-10-23 (×3): 40 mg via ORAL
  Filled 2023-10-21 (×5): qty 2

## 2023-10-21 MED ORDER — UMECLIDINIUM-VILANTEROL 62.5-25 MCG/ACT IN AEPB
1.0000 | INHALATION_SPRAY | Freq: Every day | RESPIRATORY_TRACT | Status: DC
  Administered 2023-10-21: 1 via RESPIRATORY_TRACT
  Filled 2023-10-21 (×2): qty 14

## 2023-10-21 MED ORDER — SPIRONOLACTONE 25 MG PO TABS
25.0000 mg | ORAL_TABLET | Freq: Every morning | ORAL | Status: DC
  Administered 2023-10-22 – 2023-10-23 (×2): 25 mg via ORAL
  Filled 2023-10-21 (×2): qty 1

## 2023-10-21 MED ORDER — WARFARIN SODIUM 4 MG PO TABS
4.0000 mg | ORAL_TABLET | Freq: Once | ORAL | Status: AC
  Filled 2023-10-21: qty 1

## 2023-10-21 NOTE — Plan of Care (Signed)
 Assumed care at 2100. Pt has been Aox4. Pt remains on 3L [place] oxygen. Patient has been repositioning himself in bed overnight. No significant concerns at this time. See MAR.   Problem: Coping: Goal: Level of anxiety will decrease Outcome: Progressing   Problem: Elimination: Goal: Will not experience complications related to bowel motility Outcome: Progressing   Problem: Pain Managment: Goal: General experience of comfort will improve and/or be controlled Outcome: Progressing   Problem: Safety: Goal: Ability to remain free from injury will improve Outcome: Progressing   Problem: Skin Integrity: Goal: Risk for impaired skin integrity will decrease Outcome: Progressing

## 2023-10-21 NOTE — Progress Notes (Signed)
 Assessed patient at bedside after transfer to 4NP (around 1630). Was awake and alert and talking some to staff. Patient still states some confusion. Was not on BiPAP and just on [place] when I evaluated him. Discussed with bedside RN plan was to be on BiPAP and repeat VBG after 2 hours for concerns of hypercarbia. RN called to RT to have BiPAP set up. Will relay this to our night team.   Today's Vitals   10/21/23 1212 10/21/23 1600 10/21/23 1712 10/21/23 1733  BP:  (!) 103/56  124/74  Pulse:  95 87 89  Resp:  18 13 12   Temp:  98.4 F (36.9 C)    TempSrc:  Oral    SpO2: 98% 95%  98%  Weight:      Height:      PainSc:       Body mass index is 38.84 kg/m.

## 2023-10-21 NOTE — Care Management Obs Status (Cosign Needed)
 MEDICARE OBSERVATION STATUS NOTIFICATION   Patient Details  Name: CASON SEAGREN MRN: [id number] Date of Birth: 06/05/1956   Medicare Observation Status Notification Given:  Yes    Dane Dung, RN 10/21/2023, 10:19 AM

## 2023-10-21 NOTE — Progress Notes (Signed)
 Pt adm to the unit as a transfer from 2W via bed with belongings to the side. Bedside report received from 2W nurse. Pt alert, verbally responsive who arrived on 3L oxygen nasal cannula and not on BIPAP. RN asked prior nurse about the BIPAP and per RN, RT will be coming to place pt on the BIPAP. Pt changed to alternating air mattress bed. Telemetry applied and verified with CCMD; skin assessment completed per protocol, stage 1 pressure ulcer noted to sacrum; some irritation, moist associated redness noted to scrotum, sacrum, bilateral groin and abd folds, both buttock. VSS, RT called and notified of pt's BIPAP. RN Ambrosio Junker to assume care. PDeronda Flesher Pearley Millington RN   10/21/23 1600  Vitals  Temp 98.4 F (36.9 C)  Temp Source Oral  BP (!) 103/56  MAP (mmHg) 70  BP Location Left Arm  BP Method Automatic  Patient Position (if appropriate) Lying  Pulse Rate 95  Pulse Rate Source Monitor  ECG Heart Rate 94  Resp 18  Level of Consciousness  Level of Consciousness Alert  Oxygen Therapy  SpO2 95 %  O2 Device Nasal Cannula  O2 Flow Rate (L/min) 3 L/min

## 2023-10-21 NOTE — Progress Notes (Signed)
 PHARMACY - ANTICOAGULATION CONSULT NOTE  Pharmacy Consult for warfarin dosing. Indication: atrial fibrillation, Proteins C & S deficiency and lupus anticoagulant diagnoses from past medical history with laboratory confirmation and hematology consult.   Allergies  Allergen Reactions   Fenofibrate Other (See Comments)    Per [facility]    Patient Measurements: Height: 6\' 1"  (185.4 cm) Weight: 133.5 kg (294 lb 6.4 oz) IBW/kg (Calculated) : 79.9 HEPARIN  DW (KG): 110.1  Vital Signs: Temp: 98.5 F (36.9 C) (05/14 0407) Temp Source: Oral (05/14 0407) BP: 100/73 (05/14 0407) Pulse Rate: 100 (05/14 0634)  Labs: Recent Labs    10/20/23 1324 10/20/23 1331 10/20/23 1524 10/20/23 1719 10/20/23 1840 10/20/23 2154 10/21/23 0625  HGB 13.9 13.9  14.3  --  13.9  --   --   --   HCT 43.4 41.0  42.0  --  41.0  --   --   --   PLT 266  --   --   --   --   --   --   LABPROT 26.1*  --   --   --   --   --  24.0*  INR 2.4*  --   --   --   --   --  2.1*  CREATININE 0.61 0.80  --   --   --   --   --   TROPONINIHS 36*  --  42*  --  41* 39*  --     Estimated Creatinine Clearance: 131.9 mL/min (by C-G formula based on SCr of 0.8 mg/dL).   Medical History: Past Medical History:  Diagnosis Date   Acute on chronic congestive heart failure (HCC)    Anemia, secondary    SECONDARY TO ACUTE BLOOD LOSS   Anxiety disorder    Bloody stool 08/29/2011   intermittent along with constipation.    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 IVC filter   Seizure disorder (HCC)     Medications:  Scheduled:   cycloSPORINE   1 drop Both Eyes BID   DULoxetine  30 mg Oral QHS   empagliflozin   10 mg Oral Daily   ezetimibe   10 mg Oral Daily   pantoprazole   40 mg Oral Daily   phenytoin   50 mg Oral QHS   phenytoin   200 mg Oral BID   polyvinyl alcohol   1 drop Both Eyes QID   rosuvastatin   40 mg Oral QHS   traZODone   50 mg Oral QHS   warfarin  3.5 mg Oral q1600   Warfarin - Pharmacist Dosing Inpatient   Does not apply q1600    Assessment: 67 yom with a history of CVA with right sided hemiplegia, Afib/flutter (on Warfarin), HF, chronic respiratory failure, HLD, HTN, epilepsy, protein  C deficiency, lupus anticoagulant, obesity. Patient is presenting from [facility] with generalized weakness. Warfarin per pharmacy consult placed for atrial fibrillation and protein C , S deficiency and lupus anticoagulant history.   Patient taking warfarin prior to arrival. Home warfarin dose is 3.5mg  daily. PT/INR today is 24 seconds and 2.1 respectively.  Goal of Therapy:  INR 2-3 Monitor platelets by anticoagulation protocol: Yes, 266 K/uL today.    Plan:  Warfarin 4 mg PO today at 1600 hours. Continued daily PT/INR and surveillance for signs and symptoms suggestive of bleeding or hypercoagulability.   Kenda Paula, PharmD, CPP Clinical Pharmacist Practitioner 10/21/2023,7:45 AM

## 2023-10-21 NOTE — [place] FL2 (Signed)
 [place]  MEDICAID FL2 LEVEL OF CARE FORM     IDENTIFICATION  Patient Name: James Villegas Birthdate: 09/21/1955 Sex: male Admission Date (Current Location): 10/20/2023  [place] and [place] Number:  James Villegas [id number] Facility and Address:  [facility], [address], [place], [place] [address]      Provider Number: [id number]  Attending Physician Name and Address:  Sandie Cross, MD  Relative Name and Phone Number:  James Villegas, sister/ HCPOA - [phone]    Current Level of Care: Hospital Recommended Level of Care: Nursing Facility (LTC at [facility]) Prior Approval Number:    Date Approved/Denied:   PASRR Number:    Discharge Plan: SNF    Current Diagnoses: Patient Active Problem List   Diagnosis Date Noted   Acute hypoxic respiratory failure (HCC) 10/20/2023   Acute respiratory failure with hypoxia and hypercarbia (HCC) 08/19/2022   History of cardioembolic cerebrovascular accident (CVA) 08/19/2022   Acute on chronic respiratory failure with hypoxia and hypercapnia (HCC) secondary to acute on chronic diastolic CHF exacerbation 09/16/2021   Hyponatremia 09/16/2021   History of DVT (deep vein thrombosis) 09/16/2021   GERD (gastroesophageal reflux disease) 09/16/2021   Aneurysm of ascending aorta (HCC)    Hypoxia    Acute on chronic heart failure with preserved ejection fraction (HCC) 07/30/2020   Acute on chronic diastolic CHF (congestive heart failure) (HCC) 07/30/2020   Acute on chronic respiratory failure with hypercapnia (HCC) 07/30/2020   Pressure injury of skin 02/22/2020   AKI (acute kidney injury) (HCC)    HCAP (healthcare-associated pneumonia)    Acute metabolic encephalopathy    Hypercoagulable state (HCC)    Seizure disorder (HCC)    Chronic diastolic CHF (congestive heart failure) (HCC)    Atrial fibrillation, chronic (HCC) on chronic anticoagulation  protein S and C deficiency    History of CVA with residual  deficit    Hyperlipidemia    Acute respiratory failure (HCC) 02/20/2020   CAP (community acquired pneumonia) 02/20/2020   Obesity, Class III, BMI 40-49.9 (morbid obesity) 02/20/2020   Bradycardia 02/20/2020   Hyperkalemia 02/20/2020   Hypercapnic respiratory failure (HCC) 02/20/2020   Acute on chronic congestive heart failure (HCC)    Permanent atrial fibrillation (HCC)    Respiratory failure (HCC) 08/02/2018   Anemia of other chronic disease 11/23/2013   Pain in joint, ankle and foot 11/14/2013   Other convulsions 10/19/2013   Noninfectious gastroenteritis and colitis 07/29/2013   Left upper quadrant abdominal mass 07/28/2013   Other specified disease of white blood cells 07/16/2013   Ingrowing toenail with infection 05/20/2013   Late effects of cerebrovascular disease 10/15/2012   Pure hypercholesterolemia 10/15/2012   Essential hypertension, benign 10/15/2012   Hereditary and idiopathic peripheral neuropathy 10/15/2012   Cerebral infarction (HCC) 08/29/2011   DVT (deep venous thrombosis) (HCC) 08/29/2011   Bloody stool 08/29/2011   PFO (patent foramen ovale) 02/18/2011    Orientation RESPIRATION BLADDER Height & Weight     Self, Time, Situation, Place  O2, Other (Comment) (CPAP at facility,  oxygen via [place] at LTC facility at 4L/min [place]) Continent Weight: 133.5 kg Height:  6\' 1"  (185.4 cm)  BEHAVIORAL SYMPTOMS/MOOD NEUROLOGICAL BOWEL NUTRITION STATUS      Continent Diet (See Discharge Summary)  AMBULATORY STATUS COMMUNICATION OF NEEDS Skin   Total Care Verbally Other (Comment) (Sacral wound - stage 1, wound to groin)  Personal Care Assistance Level of Assistance  Bathing, Feeding, Dressing Bathing Assistance: Maximum assistance Feeding assistance: Limited assistance Dressing Assistance: Maximum assistance     Functional Limitations Info  Sight, Hearing, Speech Sight Info: Adequate Hearing Info: Adequate Speech Info: Adequate    SPECIAL  CARE FACTORS FREQUENCY                       Contractures Contractures Info: Not present    Additional Factors Info  Code Status, Psychotropic Code Status Info: DNR   Psychotropic Info: cymbalta, trazodone          Current Medications (10/21/2023):  This is the current hospital active medication list Current Facility-Administered Medications  Medication Dose Route Frequency Provider Last Rate Last Admin   acetaminophen  (TYLENOL ) tablet 650 mg  650 mg Oral Q6H PRN Cathey Clunes, MD   650 mg at 10/20/23 2233   cycloSPORINE  (RESTASIS ) 0.05 % ophthalmic emulsion 1 drop  1 drop Both Eyes BID Gomez-Caraballo, Maria, MD   1 drop at 10/21/23 0919   DULoxetine (CYMBALTA) DR capsule 30 mg  30 mg Oral QHS Cleven Dallas, DO   30 mg at 10/20/23 2233   empagliflozin  (JARDIANCE ) tablet 10 mg  10 mg Oral Daily Gomez-Caraballo, Maria, MD   10 mg at 10/21/23 0919   ezetimibe  (ZETIA ) tablet 10 mg  10 mg Oral Daily Gomez-Caraballo, Maria, MD   10 mg at 10/21/23 0919   pantoprazole  (PROTONIX ) EC tablet 40 mg  40 mg Oral Daily Gomez-Caraballo, Maria, MD   40 mg at 10/21/23 0919   phenytoin  (DILANTIN ) chewable tablet 50 mg  50 mg Oral QHS Cleven Dallas, DO   50 mg at 10/20/23 2234   phenytoin  (DILANTIN ) ER capsule 200 mg  200 mg Oral BID Gomez-Caraballo, Maria, MD   200 mg at 10/21/23 2956   polyvinyl alcohol  (LIQUIFILM TEARS) 1.4 % ophthalmic solution 1 drop  1 drop Both Eyes QID Sandie Cross, MD   1 drop at 10/21/23 2130   rosuvastatin  (CRESTOR ) tablet 40 mg  40 mg Oral QHS Gomez-Caraballo, Maria, MD   40 mg at 10/20/23 2233   traZODone  (DESYREL ) tablet 50 mg  50 mg Oral QHS Gomez-Caraballo, Maria, MD   50 mg at 10/20/23 2233   warfarin (COUMADIN ) tablet 4 mg  4 mg Oral ONCE-1600 Sandie Cross, MD       Warfarin - Pharmacist Dosing Inpatient   Does not apply [id number] Sandie Cross, MD         Discharge Medications: Please see discharge summary for a list of discharge  medications.  Relevant Imaging Results:  Relevant Lab Results:   Additional Information SS# [id number]  Dane Dung, RN

## 2023-10-21 NOTE — Progress Notes (Signed)
 Subjective:  Reports not sleeping well last night, was unable to wear CPAP comfortably for the entire duration of the night. Also reports feeling hot making it harder to sleep well. Still feels short of breath, but improved from when he came in yesterday.  Objective:  Vital signs in last 24 hours: Vitals:   10/20/23 2200 10/21/23 0131 10/21/23 0407 10/21/23 0500  BP: 96/68  100/73   Pulse: 91  98   Resp: 18  19   Temp: 98.4 F (36.9 C)  98.5 F (36.9 C)   TempSrc: Oral  Oral   SpO2: 97% 95% (!) 78%   Weight:    133.8 kg  Height:       Weight change:  No intake or output data in the 24 hours ending 10/21/23 0614 Constitutional: Laying in bed, no acute distress HENT: normocephalic atraumatic, mucous membranes moist Cardiovascular: Regular rate and rhythm, JVD unable to assess due to body habitus Pulmonary/Chest: Poor respiratory effort, no crackles/rhonchi.  Abdominal: soft, non-tender, non-distended Neurological: alert & oriented x 3 MSK: Right sided upper and lower strength 0/5, left sided upper and lower strength 5/5 Extremities: 1+ pitting edema b/l LE Psych: Normal mood and affect  Assessment/Plan: Principal Problem:   Acute hypoxic respiratory failure (HCC) Active Problems:   Cerebral infarction (HCC)   DVT (deep venous thrombosis) (HCC)   Permanent atrial fibrillation (HCC)   Seizure disorder (HCC)   History of CVA with residual deficit  Acute on Chronic Hypoxic Hypercarbic respiratory failure Obstructive Pulmonary Disease This episode of acute hypoxic hypercarbia is most likely 2/2 chronic OHS/untreated OSA. Some component of fluid overload is possible, but CXR was negative for any pulmonary edema or pleural effusions and BNP was wnl. Infectious etiology is unlikely given he has remained afebrile, WBC is normal and RVP was negative. Had PFTs on 08/25/2022 which showed FEV1/FVC of 53% consistent with severe COPD, so will start LABA/LAMA. VBG obtained after being  off BiPAP for 2 hours (around 9pm) with pCO2 elevated to 76, repeat VBG this morning after intermittent CPAP use overnight with pCO2 of 79. pCO2 continued to rise while on 3L [place], so will place back on BiPAP. On Torsemide  40 mg BID and Spiro 45 mg at home. S/p 80 mg IV lasix  in ED. - Start Ellipta inhaler for OHS/OSA - Continue BiPAP until ABG results - Restart home Torsemide  40 mg BID - Hold home Spironolactone  while on IV lasix  - Continue PO Jardiance  10 mg daily  - Echo pending - Telemetry - Bcx NGx24hr - UA needs to be collected - Trend CBC and CMP - Condom catheter - Strict I&Os, daily weights - CPAP at night   Atrial Fibrillation/Atrial Flutter EKG in ED with RBBB with posterior fascicular block with afib, consistent with prior on Oct 19, 2022. On Warfarin at home, INR in ED 2.4. - Warfarin per pharmacy - Monitor CBC    HTN - Holding home Losartan in the setting of normal pressures   Protein C&S Deficiency Lupus Anticoagulant On Warfarin at home for Afib, protein C&S and LA. INR in ED 2.4. Denies recent falls, hematochezia, melena, hematuria, or any other concern for an acute bleed. Goal INR for protein C&S deficiency is 2-3, therefore INR is at goal.  - Continue home warfarin per pharmacy   Epilepsy - Phenytoin  level elevated 20.6 @ 1400 yesterday, will get true trough level at 0700 tomorrow. - Continue home Phenytoin  pending level   Depression - Continue home Cymbalta 30 mg -  Continue home Trazadone 50 mg qhs   LOS: 0 days   Shonteria Abeln, Nana Avers, Medical Student 10/21/2023, 6:14 AM

## 2023-10-21 NOTE — Progress Notes (Signed)
 Transition of Care [facility]) - Inpatient Brief Assessment   Patient Details  Name: James Villegas MRN: [id number] Date of Birth: 1956-01-11  Transition of Care [facility]) CM/SW Contact:    Dane Dung, RN Phone Number: 10/21/2023, 10:36 AM   Clinical Narrative: CM met with the patient at the bedside and patient states that he plans to return to [facility] facility when he is stable for discharge.  [place] letter provided at the bedside.  Patient is normally on chronic oxygen at the facility at 4L/min [place] with CPAP at night.  Patient uses hoyer lift and electric wheelchair at the facility at baseline.  FL2 completed.  [name] is aware that patient is at the hospital and plans to accept the patient back to the facility when stable.   Transition of Care Asessment: Insurance and Status: (P) Insurance coverage has been reviewed Patient has primary care physician: (P) Yes Home environment has been reviewed: (P) from [facility] Prior level of function:: (P) electric wheelchair at the facility Prior/Current Home Services: (P) Current home services Social Drivers of Health Review: (P) SDOH reviewed interventions complete Readmission risk has been reviewed: (P) Yes Transition of care needs: (P) transition of care needs identified, TOC will continue to follow

## 2023-10-21 NOTE — Progress Notes (Signed)
 PHARMACY - PHYSICIAN COMMUNICATION CRITICAL VALUE ALERT - BLOOD CULTURE IDENTIFICATION (BCID)  James Villegas is an 68 y.o. male who presented to [facility] on 10/20/2023 with a chief complaint of diaphoresis on admission (h/o CVA with hemplegia, Afib, HF, hypercarbia)  Assessment:  BCID resulted with staphylcoccal species (no resistance detected); 1/4 bottles with GPCs.   Name of physician (or Provider) Contacted: Dr. Abe Hodgkins (via secure chat)  Current antibiotics: no antimicrobials   Changes to prescribed antibiotics recommended: No antibiotics recommended as this is likely a contaminant. No recommended changes.    Results for orders placed or performed during the hospital encounter of 10/20/23  Blood Culture ID Panel (Reflexed) (Collected: 10/20/2023  1:29 PM)  Result Value Ref Range   Enterococcus faecalis NOT DETECTED NOT DETECTED   Enterococcus Faecium NOT DETECTED NOT DETECTED   Listeria monocytogenes NOT DETECTED NOT DETECTED   Staphylococcus species DETECTED (A) NOT DETECTED   Staphylococcus aureus (BCID) NOT DETECTED NOT DETECTED   Staphylococcus epidermidis NOT DETECTED NOT DETECTED   Staphylococcus lugdunensis NOT DETECTED NOT DETECTED   Streptococcus species NOT DETECTED NOT DETECTED   Streptococcus agalactiae NOT DETECTED NOT DETECTED   Streptococcus pneumoniae NOT DETECTED NOT DETECTED   Streptococcus pyogenes NOT DETECTED NOT DETECTED   A.calcoaceticus-baumannii NOT DETECTED NOT DETECTED   Bacteroides fragilis NOT DETECTED NOT DETECTED   Enterobacterales NOT DETECTED NOT DETECTED   Enterobacter cloacae complex NOT DETECTED NOT DETECTED   Escherichia coli NOT DETECTED NOT DETECTED   Klebsiella aerogenes NOT DETECTED NOT DETECTED   Klebsiella oxytoca NOT DETECTED NOT DETECTED   Klebsiella pneumoniae NOT DETECTED NOT DETECTED   Proteus species NOT DETECTED NOT DETECTED   Salmonella species NOT DETECTED NOT DETECTED   Serratia marcescens NOT DETECTED NOT  DETECTED   Haemophilus influenzae NOT DETECTED NOT DETECTED   Neisseria meningitidis NOT DETECTED NOT DETECTED   Pseudomonas aeruginosa NOT DETECTED NOT DETECTED   Stenotrophomonas maltophilia NOT DETECTED NOT DETECTED   Candida albicans NOT DETECTED NOT DETECTED   Candida auris NOT DETECTED NOT DETECTED   Candida glabrata NOT DETECTED NOT DETECTED   Candida krusei NOT DETECTED NOT DETECTED   Candida parapsilosis NOT DETECTED NOT DETECTED   Candida tropicalis NOT DETECTED NOT DETECTED   Cryptococcus neoformans/gattii NOT DETECTED NOT DETECTED    [name] 10/21/2023  12:20 PM

## 2023-10-21 NOTE — Progress Notes (Addendum)
 Assessed patient at bedside given hypercapnia. He is notably more drowsy than this morning and states he feels "more confused". STAT Order placed for BiPaP and transfer order placed to progressive. Will repeat VBG and have call team assess in a few hours. Of note, patient has changed his code status to FULL CODE.

## 2023-10-21 NOTE — Progress Notes (Signed)
   10/20/23 2335  BiPAP/CPAP/SIPAP  $ Non-Invasive Ventilator  Non-Invasive Vent Set Up  BiPAP/CPAP/SIPAP Pt Type Adult  BiPAP/CPAP/SIPAP Resmed  Mask Type Full face mask  Mask Size Large  Respiratory Rate 18 breaths/min  IPAP 10 cmH20  EPAP 5 cmH2O  FiO2 (%) 32 %  Flow Rate 3 lpm  Minute Ventilation 25.3  Peak Inspiratory Pressure (PIP) 11  Tidal Volume (Vt) 589  Patient Home Machine No  Patient Home Mask No  Patient Home Tubing No  Auto Titrate No  Press High Alarm 20 cmH2O  Device Plugged into RED Power Outlet Yes

## 2023-10-21 NOTE — Progress Notes (Signed)
 Assessed patient for ABG/BIPAP order. Patient is alert oriented X3. Visitor at bedside. Patient is requesting to order lunch. Patient requests RT to return after company leaves to placed BIPAP. No distress noted at this time.

## 2023-10-21 NOTE — Progress Notes (Signed)
 PCO2 =80 MD notified via telephone, no new orders given ,continue to monitor. Pt remain on Bipap, sleepy awake by tactile stimuli, md aware of patient's status.

## 2023-10-21 NOTE — Progress Notes (Signed)
 Placed patient on BIPAP (dreamstation) at bedside 14/5 with  3L O2 bleed in. Awaiting bed placement to progressive to switch to Servo continuous BIPAP. RN aware.

## 2023-10-22 ENCOUNTER — Inpatient Hospital Stay (HOSPITAL_COMMUNITY)

## 2023-10-22 DIAGNOSIS — G9340 Encephalopathy, unspecified: Secondary | ICD-10-CM

## 2023-10-22 DIAGNOSIS — J9602 Acute respiratory failure with hypercapnia: Secondary | ICD-10-CM | POA: Diagnosis not present

## 2023-10-22 LAB — AMMONIA: Ammonia: 50 umol/L — ABNORMAL HIGH (ref 9–35)

## 2023-10-22 LAB — BLOOD GAS, ARTERIAL
Acid-Base Excess: 20.9 mmol/L — ABNORMAL HIGH (ref 0.0–2.0)
Bicarbonate: 48.4 mmol/L — ABNORMAL HIGH (ref 20.0–28.0)
O2 Saturation: 100 %
Patient temperature: 37.1
pCO2 arterial: 65 mmHg — ABNORMAL HIGH (ref 32–48)
pH, Arterial: 7.48 — ABNORMAL HIGH (ref 7.35–7.45)
pO2, Arterial: 123 mmHg — ABNORMAL HIGH (ref 83–108)

## 2023-10-22 LAB — COMPREHENSIVE METABOLIC PANEL WITH GFR
ALT: 13 U/L (ref 0–44)
AST: 15 U/L (ref 15–41)
Albumin: 2.9 g/dL — ABNORMAL LOW (ref 3.5–5.0)
Alkaline Phosphatase: 96 U/L (ref 38–126)
Anion gap: 9 (ref 5–15)
BUN: 10 mg/dL (ref 8–23)
CO2: 39 mmol/L — ABNORMAL HIGH (ref 22–32)
Calcium: 8.6 mg/dL — ABNORMAL LOW (ref 8.9–10.3)
Chloride: 93 mmol/L — ABNORMAL LOW (ref 98–111)
Creatinine, Ser: 0.52 mg/dL — ABNORMAL LOW (ref 0.61–1.24)
GFR, Estimated: >60 mL/min (ref >60)
Glucose, Bld: 90 mg/dL (ref 70–99)
Potassium: 3.6 mmol/L (ref 3.5–5.1)
Sodium: 141 mmol/L (ref 135–145)
Total Bilirubin: 0.7 mg/dL (ref 0.0–1.2)
Total Protein: 5.6 g/dL — ABNORMAL LOW (ref 6.5–8.1)

## 2023-10-22 LAB — BLOOD GAS, VENOUS
Acid-Base Excess: 18.4 mmol/L — ABNORMAL HIGH (ref 0.0–2.0)
Bicarbonate: 48.6 mmol/L — ABNORMAL HIGH (ref 20.0–28.0)
O2 Saturation: 59.5 %
Patient temperature: 36.4
pCO2, Ven: 84 mmHg (ref 44–60)
pH, Ven: 7.37 (ref 7.25–7.43)
pO2, Ven: 34 mmHg (ref 32–45)

## 2023-10-22 LAB — PROTIME-INR
INR: 2 — ABNORMAL HIGH (ref 0.8–1.2)
INR: 2.1 — ABNORMAL HIGH (ref 0.8–1.2)
Prothrombin Time: 22.9 s — ABNORMAL HIGH (ref 11.4–15.2)
Prothrombin Time: 23.8 s — ABNORMAL HIGH (ref 11.4–15.2)

## 2023-10-22 LAB — GLUCOSE, CAPILLARY: Glucose-Capillary: 88 mg/dL (ref 70–99)

## 2023-10-22 LAB — PHENYTOIN LEVEL, TOTAL: Phenytoin Lvl: 20.1 ug/mL — ABNORMAL HIGH (ref 10.0–20.0)

## 2023-10-22 MED ORDER — METHYLPREDNISOLONE SODIUM SUCC 125 MG IJ SOLR
40.0000 mg | INTRAMUSCULAR | Status: DC
  Administered 2023-10-22 – 2023-10-23 (×2): 40 mg via INTRAVENOUS
  Filled 2023-10-22 (×2): qty 2

## 2023-10-22 MED ORDER — MUPIROCIN 2 % EX OINT
1.0000 | TOPICAL_OINTMENT | Freq: Two times a day (BID) | CUTANEOUS | Status: DC
  Administered 2023-10-22 – 2023-10-23 (×3): 1 via NASAL
  Filled 2023-10-22: qty 22

## 2023-10-22 MED ORDER — ACETAZOLAMIDE SODIUM 500 MG IJ SOLR
500.0000 mg | Freq: Once | INTRAMUSCULAR | Status: AC
  Administered 2023-10-22: 500 mg via INTRAVENOUS
  Filled 2023-10-22: qty 500

## 2023-10-22 MED ORDER — REVEFENACIN 175 MCG/3ML IN SOLN
175.0000 ug | Freq: Every day | RESPIRATORY_TRACT | Status: DC
Start: 2023-10-22 — End: 2023-10-23
  Administered 2023-10-22 – 2023-10-23 (×2): 175 ug via RESPIRATORY_TRACT
  Filled 2023-10-22 (×2): qty 3

## 2023-10-22 MED ORDER — ARFORMOTEROL TARTRATE 15 MCG/2ML IN NEBU
15.0000 ug | INHALATION_SOLUTION | Freq: Two times a day (BID) | RESPIRATORY_TRACT | Status: DC
  Administered 2023-10-22 – 2023-10-23 (×3): 15 ug via RESPIRATORY_TRACT
  Filled 2023-10-22 (×3): qty 2

## 2023-10-22 MED ORDER — IPRATROPIUM-ALBUTEROL 0.5-2.5 (3) MG/3ML IN SOLN: 3.0000 mL | RESPIRATORY_TRACT | Status: DC | PRN

## 2023-10-22 MED ORDER — CHLORHEXIDINE GLUCONATE CLOTH 2 % EX PADS
6.0000 | MEDICATED_PAD | Freq: Every day | CUTANEOUS | Status: DC
  Administered 2023-10-22 – 2023-10-23 (×2): 6 via TOPICAL

## 2023-10-22 MED ORDER — SODIUM CHLORIDE 0.9 % IV SOLN
1.0000 g | INTRAVENOUS | Status: DC
  Administered 2023-10-22 – 2023-10-23 (×2): 1 g via INTRAVENOUS
  Filled 2023-10-22 (×2): qty 10

## 2023-10-22 MED ORDER — METHYLPREDNISOLONE SODIUM SUCC 125 MG IJ SOLR: 40.0000 mg | Freq: Two times a day (BID) | INTRAMUSCULAR | Status: DC

## 2023-10-22 MED ORDER — WARFARIN SODIUM 7.5 MG PO TABS
7.5000 mg | ORAL_TABLET | Freq: Once | ORAL | Status: AC
  Administered 2023-10-22: 7.5 mg via ORAL
  Filled 2023-10-22: qty 1

## 2023-10-22 MED ORDER — BUDESONIDE 0.5 MG/2ML IN SUSP
0.5000 mg | Freq: Two times a day (BID) | RESPIRATORY_TRACT | Status: DC
Start: 2023-10-22 — End: 2023-10-23
  Administered 2023-10-22 – 2023-10-23 (×3): 0.5 mg via RESPIRATORY_TRACT
  Filled 2023-10-22 (×3): qty 2

## 2023-10-22 NOTE — Hospital Course (Addendum)
 James Villegas is a 68 y.o. male with PMHx of CVA with right sided hemiplegia, Afib/flutter (on Warfarin), chronic diastolic HF, chronic respiratory failure with hypercarbia, HLD, HTN, epilepsy, protein C deficiency, lupus anticoagulant, unspecified mood disorder and obesity who presented from [facility] nursing home for diaphoresis and generalized weakness   Acute on Chronic Hypoxic Hypercarbic respiratory failure Obstructive Pulmonary Disease  This episode of acute hypoxic hypercarbia is most likely secondary to chronic OHS/untreated OSA. There was initial concern for some component of fluid overload, but CXR was negative for any pulmonary edema or pleural effusions and BNP was wnl. Infectious was ruled out given he remained afebrile, WBC was normal and RVP was negative. He received 80 mg IV Lasix  in the ED. Had PFTs on 08/25/2022 which showed FEV1/FVC of 53% consistent with severe COPD, so he was started on an Ellipta inhaler. VBG with rising pCO2 on 4-5L [place], max 89, so was placed on BiPAP and transferred to progressive care unit. VBG appropriately downtrended with BiPAP. He was restarted on his home Torsemide  40 gm BID, Spironolactone  25 mg daily and PO Jardiance  10 mg daily. An Echo was obtained that showed EF 65-75% with moderate LV hypertrophy with mildly elevated pulmonary arterial pressure and right atrial dilation with right atrial pressure of 15mm Hg. Pulmonary critical care was consulted for inc***  - Telemetry - Bcx collected and pending - UA needs to be collected - Holding home spironolactone  in the setting of significant  - Consider giving additional 80 mg of Lasix  overnight if still volume overloaded  - Condom catheter - Strict I&Os, daily weights - CPAP at night - SLP consult   Atrial Fibrillation/Atrial Flutter EKG in ED with RBBB with posterior fascicular block with afib, consistent with prior on Oct 19, 2022. On Warfarin at home, INR in ED 2.4.   Troponin elevation No current  or recent angina. Troponin trend wnl. EKG similar to prior.    HTN Held home Losartan in the setting of normal pressures.   Protein C&S Deficiency Lupus Anticoagulant On Warfarin at home for Afib, protein C&S and lupus anticoagulant. INR in ED 2.4. Warfarin was titrated via pharmacy recs.   Epilepsy Continued on home Phenytoin .   Depression Continued on home Cymbalta 30 mg and Trazadone 50 mg.  INSTRUCTIONS James Villegas   You were admitted to the hospital because you were having generalized weakness and increased sweating. In the Emergency Department you were found to have increased work of breathing and were placed on a face mask (BiPAP) to help you breathe better. While you were in the hospital, you continued to receive additional oxygen and your home fluid pills as well as an inhaler with improvement in your breathing.  You are now stable to go home with the following instructions:  No changes in your home medications, however, we would like you to start using a BiPAP at home in addition to your CPAP to help with your breathing.  Follow-up ***   If your symptoms return, please so not hesitate to call our clinic at [phone] or head to the nearest Emergency Department.  Take Care.  James Villegas, MS4 James Scudder, DO

## 2023-10-22 NOTE — Care Management Important Message (Signed)
 Important Message  Patient Details  Name: James Villegas MRN: [id number] Date of Birth: Oct 19, 1955   Important Message Given:  Yes - Medicare IM     Felix Host 10/22/2023, 10:41 AM

## 2023-10-22 NOTE — Progress Notes (Addendum)
 Patient was seen at bedside   now easily arousable  Interactive and asking about when he can eat  Likely chronic retainer who retained a little bit more that led to his lethargy   I believe he can be transition off BiPAP Maintain on nasal cannula to maintain saturations as close to 90 as possible  Will give 500 mg of Diamox IV  Did discuss with patient's nurse

## 2023-10-22 NOTE — Consult Note (Signed)
 NAME:  James Villegas, MRN:  [id number], DOB:  1956/05/14, LOS: 1 ADMISSION DATE:  10/20/2023, CONSULTATION DATE:  10/22/23 REFERRING MD:  Alwin Baars - IMTS, CHIEF COMPLAINT:  Hypercarbia // AMS    History of Present Illness:  68 yo M PMH hypoxic and hypercarbic respiratory failure on 4-5L O2 and CPAP, Severe COPD, suspected OSA/OHS, obesity,  prior CVA with residual R sided deficits, epilepsy, afib/flutter, Protein C S def, lupus anticoagulant, Chronic AC on warfarin who presented to ED 5/13 from his [facility] for eval of diaphoresis, generalized weakness. Initial workup c/f AoC hypercarbic failure as well as possible HF exacerbation. Was diuresed and subsequently admitted to IMTS for further mgmnt and care. Diuresis was continued. RVP covid flu neg. +Bcx staph epi felt contaminant.   Has been on/off BiPAP since admission and it sounds like mental status has waxed/waned. On serial VBG he has had high CO2. PCCM is consulted 5/15 in this setting.   Pertinent  Medical History  Hypoxic and hypercarbic resp failure COPD OHS/OSA Stroke Epilepsy Protein C S def Lupus anticoagulant Afib/flutter  HTN Depression   Significant Hospital Events: Including procedures, antibiotic start and stop dates in addition to other pertinent events   5/13 admit to IMTS after presenting w diaphoresis, weakness. AoC resp failure possible HF exacerbation. Diuresed and PRN NIV 5/14 moved to progressive. Didn't comfortably wear CPAP overnight. Was on/off BiPAP during day. Incr confusion when off BiPAP. VBG with normal pH and CO2 ranging  60-90, with some of these VBGs correlating to periods of time when his mental status was ok. 5/15 PCCM consult for persistent hypercarbia and AMS.   Interim History / Subjective:   CO2 on VBG is 84   The room is completely dark and quite with the door and curtains closed.  Pt awoke when I turned on lights.   Objective    Blood pressure 111/69, pulse 77, temperature (!) 97.5 F  (36.4 C), temperature source Axillary, resp. rate 16, height 6\' 1"  (1.854 m), weight 133.5 kg, SpO2 94%.    Vent Mode: BIPAP;PCV FiO2 (%):  [32 %-60 %] 50 % PEEP:  [6 cmH20] 6 cmH20  No intake or output data in the 24 hours ending 10/22/23 1103 Filed Weights   10/20/23 1312 10/21/23 0500 10/21/23 0634  Weight: 133.8 kg 133.8 kg 133.5 kg    Examination: General: Chronically and acutely ill older adult M NAD  HENT: Beard. BiPAP with moderate leak. Anicteric sclera  Lungs: Bilat expiratory wheeze. Variable Vt on BiPAP -- 200s to 600s  Cardiovascular: irreg rhythm reg rate cap refill brisk  Abdomen: obese soft  Extremities: edematous  Neuro: a little lethargic but awakens easily. Oriented. Following commands (excluding R side chronic deficits)  GU: defer   Resolved problem list   Assessment and Plan    Acute encephalopathy Hx CVA w R sided wknss  Epilepsy  -his CO2 on VBG is high but pH normal suggesting a degree of chronicity/compensation. I think its likely that his CO2 is possibly near baseline. In review of his chart it also appears that several of the VBGs correspond w times when his mental status was ok. I am not convinced that hypercarbia is driving his AMS/lethargy and think alt ddx need to be explored.  -came in initially for diaphoresis, wknss. Bcx w likely contaminant. UA possible UTI. WBC 6 -doesn't have appreciable acute neuro def  P -delirium precautions  -ordering rocephin  for possible UTI (which could explain both presenting CC diaphoresis wknss as  well as current AMS) -ordering CMP and ammonia. Possible hypoNa w aggressive diuresis.  -fairly low threshold for CT H and spot eeg but overall think other etiologies are more likely  -cont home AED   AoC hypoxic and hypercarbic failure COPD w possible acute exacerbation  Suspected OSA/OHS  - chronic 4-5L + noct CPAP  - for initial tx it was felt likely that this was volume overload/HF so was diuresed w some  improvement in O2 P -have incr some of his BiPAP settings and Vt / minute ventilation are more consistent now  -as he is on BiPAP, think nebs probably better fit-- have changed him over to triple therapy  -adding IV steroids w his wheeze -- can transition to PO when he is off BiPAP and taking POs. Would favor short course  -do not think serial VBGs are going to be hugely helpful and would rec de-escalating to PRN.  -will get a CXR 5/15 for completeness w concerns for worsening resp status, but dont expect this to be hugely revealing. Wouldn't be surprised if he has low lung volumes / atelectasis -cont diuresis -does not need ICU txf or adv airway at this time and is ok to remain in progressive on IMTS service    Suspected UTI - starting rocephin   DM2 - watch CBGs closely w steroid initiation  Afib - warfarin  Protein C S def, Lupus anticoagulant - warfarin  dHF - aggressive diuresis  Depression - on home meds  +staph epi bcx - likely contaminant    Best Practice (right click and "Reselect all SmartList Selections" daily)   Per primary   Labs   CBC: Recent Labs  Lab 10/20/23 1324 10/20/23 1331 10/20/23 1719 10/21/23 0625  WBC 8.2  --   --  6.0  NEUTROABS 6.1  --   --   --   HGB 13.9 13.9  14.3 13.9 13.4  HCT 43.4 41.0  42.0 41.0 41.6  MCV 96.9  --   --  96.1  PLT 266  --   --  224    Basic Metabolic Panel: Recent Labs  Lab 10/20/23 1324 10/20/23 1331 10/20/23 1719 10/21/23 1013  NA 138 137  137 137 138  K 3.9 3.9  3.9 3.7 3.5  CL 89* 86*  --  90*  CO2 40*  --   --  41*  GLUCOSE 86 89  --  124*  BUN 10 12  --  9  CREATININE 0.61 0.80  --  0.61  CALCIUM  8.5*  --   --  8.7*   GFR: Estimated Creatinine Clearance: 131.9 mL/min (by C-G formula based on SCr of 0.61 mg/dL). Recent Labs  Lab 10/20/23 1324 10/20/23 1332 10/21/23 0625  WBC 8.2  --  6.0  LATICACIDVEN  --  1.5  --     Liver Function Tests: Recent Labs  Lab 10/20/23 1324 10/21/23 1013  AST  22 19  ALT 14 14  ALKPHOS 105 102  BILITOT 0.4 0.6  PROT 6.4* 6.3*  ALBUMIN 3.1* 3.1*   No results for input(s): "LIPASE", "AMYLASE" in the last 168 hours. No results for input(s): "AMMONIA" in the last 168 hours.  ABG    Component Value Date/Time   PHART 7.468 (H) 08/20/2022 0906   PCO2ART 57.9 (H) 08/20/2022 0906   PO2ART 64 (L) 08/20/2022 0906   HCO3 48.6 (H) 10/22/2023 0921   TCO2 46 (H) 10/20/2023 1719   O2SAT 59.5 10/22/2023 0921     Coagulation Profile:  Recent Labs  Lab 10/20/23 1324 10/21/23 0625 10/21/23 2331 10/22/23 0921  INR 2.4* 2.1* 2.1* 2.0*    Cardiac Enzymes: No results for input(s): "CKTOTAL", "CKMB", "CKMBINDEX", "TROPONINI" in the last 168 hours.  HbA1C: Hgb A1c MFr Bld  Date/Time Value Ref Range Status  12/16/2010 05:50 AM 5.4 <5.7 % Final    Comment:    (NOTE)                                                                       According to the ADA Clinical Practice Recommendations for 2011, when HbA1c is used as a screening test:  >=6.5%   Diagnostic of Diabetes Mellitus           (if abnormal result is confirmed) 5.7-6.4%   Increased risk of developing Diabetes Mellitus References:Diagnosis and Classification of Diabetes Mellitus,Diabetes Care,2011,34(Suppl 1):S62-S69 and Standards of Medical Care in         Diabetes - 2011,Diabetes Care,2011,34 (Suppl 1):S11-S61.  07/15/2008 01:20 PM  4.6 - 6.1 % Final   5.0 (NOTE)   The ADA recommends the following therapeutic goal for glycemic   control related to Hgb A1C measurement:   Goal of Therapy:   < 7.0% Hgb A1C   Reference: American Diabetes Association: Clinical Practice   Recommendations 2008, Diabetes Care,  2008, 31:(Suppl 1).    CBG: Recent Labs  Lab 10/22/23 0759  GLUCAP 88    Review of Systems:   Review of Systems  Constitutional:  Positive for diaphoresis and malaise/fatigue.  HENT: Negative.    Respiratory: Negative.    Cardiovascular:  Positive for leg swelling. Negative  for chest pain, palpitations and orthopnea.  Skin: Negative.   Neurological:        Chronic R sided weakness  Psychiatric/Behavioral:  Positive for depression.      Past Medical History:  He,  has a past medical history of Acute on chronic congestive heart failure (HCC), Anemia, secondary, Anxiety disorder, Bloody stool (08/29/2011), CVA (cerebral vascular accident) (HCC) (2010), Depression, DVT of lower extremity (deep venous thrombosis) (HCC), Dysphagia, Hyperlipidemia, Hypertension, Lupus anticoagulant disorder (HCC) (1990), Morbid obesity (HCC), PFO (patent foramen ovale), Protein C deficiency (HCC), Protein S deficiency (HCC), Rectus sheath hematoma (7/12), and Seizure disorder (HCC).   Surgical History:   Past Surgical History:  Procedure Laterality Date   dental extraction     multiple   vena cavogram  12/2010   INFERIOR     Social History:   reports that he quit smoking about 15 years ago. His smoking use included cigarettes and cigars. He started smoking about 30 years ago. He has a 15 pack-year smoking history. He has never used smokeless tobacco. He reports that he does not drink alcohol  and does not use drugs.   Family History:  His family history includes Sleep apnea in his sister.   Allergies Allergies  Allergen Reactions   Fenofibrate Other (See Comments)    Per MAR     Home Medications  Prior to Admission medications   Medication Sig Start Date End Date Taking? Authorizing Provider  acetaminophen  (TYLENOL ) 325 MG tablet Take 650 mg by mouth every 6 (six) hours as needed for mild pain, headache or fever.   Yes [provider]  antiseptic oral rinse (BIOTENE) LIQD 1 Application by Mouth Rinse route at bedtime.   Yes [provider]  Baclofen  5 MG TABS Take 5 mg by mouth at bedtime. 02/19/23  Yes [provider]  bisacodyl  (DULCOLAX) 5 MG EC tablet Take 10 mg by mouth daily as needed for moderate constipation.   Yes [provider]   Carboxymeth-Glycerin-Polysorb (REFRESH OPTIVE ADVANCED) 0.5-1-0.5 % SOLN Place 1 drop into both eyes in the morning, at noon, in the evening, and at bedtime. Wait 3-5 minutes between eye medications   Yes [provider]  cycloSPORINE  (RESTASIS ) 0.05 % ophthalmic emulsion Place 1 drop into both eyes 2 (two) times daily.   Yes [provider]  DULoxetine (CYMBALTA) 30 MG capsule Take 30 mg by mouth in the morning. 08/31/23  Yes [provider]  empagliflozin  (JARDIANCE ) 10 MG TABS tablet Take by mouth daily.   Yes [provider]  ezetimibe  (ZETIA ) 10 MG tablet Take 10 mg by mouth daily.   Yes [provider]  guaiFENesin (ROBITUSSIN) 100 MG/5ML liquid Take 15 mLs by mouth every 4 (four) hours as needed for cough.   Yes [provider]  losartan (COZAAR) 25 MG tablet Take 12.5 mg by mouth daily.   Yes [provider]  omeprazole  (PRILOSEC) 20 MG capsule Take 20 mg by mouth every other day. 08/01/21  Yes [provider]  OXYGEN Inhale 4 L into the lungs continuous.   Yes [provider]  phenytoin  (DILANTIN  INFATABS) 50 MG tablet Chew 50 mg by mouth at bedtime. Give with 200mg  capsules for total dose of 250mg  qhs   Yes [provider]  phenytoin  (DILANTIN ) 100 MG ER capsule Take 200 mg by mouth 2 (two) times daily.   Yes [provider]  potassium chloride  SA (KLOR-CON ) 20 MEQ tablet Take 2 tablets (40 mEq total) by mouth daily. 08/08/20  Yes Audria Leather, MD  rosuvastatin  (CRESTOR ) 40 MG tablet Take 40 mg by mouth at bedtime.   Yes [provider]  spironolactone  (ALDACTONE ) 25 MG tablet Take 1 tablet (25 mg total) by mouth daily. Patient taking differently: Take 25 mg by mouth in the morning. 09/22/21 10/20/23 Yes Shahmehdi, Constantino Demark, MD  torsemide  (DEMADEX ) 20 MG tablet Take 40 mg by mouth in the morning and at bedtime. 08/31/23  Yes [provider]  traZODone  (DESYREL ) 50 MG tablet Take  25 mg by mouth at bedtime. 08/01/21  Yes [provider]  warfarin (COUMADIN ) 1 MG tablet Take 3.5 mg by mouth daily.   Yes [provider]     Critical care time: na        Eston Hence MSN, AGACNP-BC [facility] Pulmonary/Critical Care Medicine Amion for pager  10/22/2023, 12:21 PM

## 2023-10-22 NOTE — Progress Notes (Addendum)
 Subjective:  Reports improvement in breathing this morning with BiPAP overnight. No questions for the team this AM.   Objective:  Vital signs in last 24 hours: Vitals:   10/21/23 2109 10/21/23 2320 10/21/23 2340 10/22/23 0412  BP:  (!) 93/59  (!) 80/66  Pulse:  80 94 (!) 55  Resp:  18 17 (!) 21  Temp:  97.9 F (36.6 C)  98.8 F (37.1 C)  TempSrc:  Axillary  Axillary  SpO2: 97% 96% 97% 94%  Weight:      Height:       Weight change:  No intake or output data in the 24 hours ending 10/22/23 0607 Constitutional: Laying in bed, no acute distress. Somnolent but arousable. HENT: normocephalic atraumatic, mucous membranes moist Cardiovascular: Regular rate and rhythm Pulmonary/Chest: Poor respiratory effort, no crackles/rhonchi.  Abdominal: soft, non-tender, non-distended Neurological: Did not ask orientation questions this morning MSK: Right sided upper and lower strength 0/5, left sided upper and lower strength 5/5 Extremities: Trace pitting edema b/l LE Psych: Normal mood and affect  Assessment/Plan: Principal Problem:   Acute hypoxic respiratory failure (HCC) Active Problems:   Cerebral infarction (HCC)   DVT (deep venous thrombosis) (HCC)   Permanent atrial fibrillation (HCC)   Seizure disorder (HCC)   History of CVA with residual deficit   Acute on chronic heart failure with preserved ejection fraction (HCC)  Acute on Chronic Hypoxic Hypercarbic respiratory failure Obstructive Pulmonary Disease Current episode of acute hypoxic hypercarbia most likely 2/2 chronic OHS/untreated OSA. Had PFTs on 08/25/2022 with FEV1/FVC of 53% and FEV1 31% consistent with severe COPD. Yesterday pCO2 rose to 79, transferred to progressive care unit and placed back on BiPAP. Overnight, pCO2 80 (@2000 ), improving to 61 (@2340 ). Received home Torsemide  40 mg last night with undocumented UOP and subsequent soft BPs. Soft pressures may also secondary to continuous BiPAP use. Will try and wean off  BiPAP this morning and follow-up VBG and hold home Torsemide  in the setting of soft pressures. Given chronic nature of respiratory failure requiring significant oxygen support, will initiate goals of care conversation. Low threshold to consult Pulm Crit Care if VBG this morning remains elevated. - Continue Ellipta inhaler for OHS/OSA - Wean off BiPAP, repeat VBG at 0930 - Holding home Torsemide  40 mg BID - Holding home Spironolactone  25 mg daily - Continue PO Jardiance  10 mg daily  - Echo (5/14): EF 60-75%, moderate LV hypertrophy, mildly elevated pulmonary artery pressure with elevated R atrial pressure of 15 - Telemetry - BCx: 1/2 with staph species, most likely contaminant - UA: LE with moderate bacteria, UCx pending - Condom catheter - Strict I&Os, daily weights - CPAP at night - Consider Palliative Care consulted to aid in goals of care discussion   Atrial Fibrillation/Atrial Flutter EKG in ED with RBBB with posterior fascicular block with afib, consistent with prior on Oct 19, 2022. On Warfarin at home, INR in ED 2.4. - Warfarin per pharmacy   HTN - Holding home Losartan in the setting of normal pressures   Protein C&S Deficiency Lupus Anticoagulant On Warfarin at home for Afib, protein C&S and LA. INR in ED 2.4. Denies recent falls, hematochezia, melena, hematuria, or any other concern for an acute bleed. Goal INR for protein C&S deficiency is 2-3, therefore INR is at goal.  - Continue home warfarin per pharmacy   Epilepsy - Continue home Phenytoin  200 mg BID + 50 mg chewable tablet at bedtime   Depression - Continue home Cymbalta 30 mg -  Continue home Trazadone 50 mg qhs   LOS: 1 day   James Villegas, James Villegas, Medical Student 10/22/2023, 6:07 AM

## 2023-10-22 NOTE — Plan of Care (Signed)

## 2023-10-22 NOTE — Progress Notes (Signed)
 PHARMACY - ANTICOAGULATION CONSULT NOTE  Pharmacy Consult for warfarin management by pharmacy protocol. Indication:  atrial fibrillation, Proteins C & S deficiency and lupus anticoagulant diagnoses from past medical history with laboratory confirmation and hematology consult.   Allergies  Allergen Reactions   Fenofibrate Other (See Comments)    Per [facility]    Patient Measurements: Height: 6\' 1"  (185.4 cm) Weight: 133.5 kg (294 lb 6.4 oz) IBW/kg (Calculated) : 79.9 HEPARIN  DW (KG): 110.1  Vital Signs: Temp: 97.5 F (36.4 C) (05/15 0800) Temp Source: Axillary (05/15 0800) BP: 111/69 (05/15 0829) Pulse Rate: 77 (05/15 0829)  Labs: Recent Labs    10/20/23 1324 10/20/23 1331 10/20/23 1524 10/20/23 1719 10/20/23 1840 10/20/23 2154 10/21/23 0625 10/21/23 1013 10/21/23 2331 10/22/23 0921  HGB 13.9 13.9  14.3  --  13.9  --   --  13.4  --   --   --   HCT 43.4 41.0  42.0  --  41.0  --   --  41.6  --   --   --   PLT 266  --   --   --   --   --  224  --   --   --   LABPROT 26.1*  --   --   --   --   --  24.0*  --  23.8* 22.9*  INR 2.4*  --   --   --   --   --  2.1*  --  2.1* 2.0*  CREATININE 0.61 0.80  --   --   --   --   --  0.61  --   --   TROPONINIHS 36*  --  42*  --  41* 39*  --   --   --   --     Estimated Creatinine Clearance: 131.9 mL/min (by C-G formula based on SCr of 0.61 mg/dL).   Medical History: Past Medical History:  Diagnosis Date   Acute on chronic congestive heart failure (HCC)    Anemia, secondary    SECONDARY TO ACUTE BLOOD LOSS   Anxiety disorder    Bloody stool 08/29/2011   intermittent along with constipation.    CVA (cerebral vascular accident) (HCC) 2010   large left MCA stroke with right hemiparesis   Depression    DVT of lower extremity (deep venous thrombosis) (HCC)    RIGHT LOWER; s/p IVC filter 7/12   Dysphagia    Hyperlipidemia    Hypertension    Lupus anticoagulant disorder (HCC) 1990   Morbid obesity (HCC)    PFO (patent foramen  ovale)    TEE 2/10: EF 60%, trivial AI, mild Ao root dilatation, mod PFO with R-L shunting, atrial septal aneurysm;   echo 7/12: EF 65-70%, grade 1 diast dysfxn, mild MR, LVOT showed severe obstruction   Protein C deficiency (HCC)    Protein S deficiency (HCC)    Rectus sheath hematoma 7/12   required reversal of anticoagulation and c/b DVT req. IVC filter   Seizure disorder (HCC)     Medications:  Scheduled:   cycloSPORINE   1 drop Both Eyes BID   DULoxetine  30 mg Oral QHS   empagliflozin   10 mg Oral Daily   ezetimibe   10 mg Oral Daily   pantoprazole   40 mg Oral Daily   phenytoin   50 mg Oral QHS   phenytoin   200 mg Oral BID   polyvinyl alcohol   1 drop Both Eyes QID   rosuvastatin   40 mg  Oral QHS   spironolactone   25 mg Oral q AM   torsemide   40 mg Oral BID   traZODone   50 mg Oral QHS   umeclidinium-vilanterol  1 puff Inhalation Daily   warfarin  4 mg Oral ONCE-1600   Warfarin - Pharmacist Dosing Inpatient   Does not apply q1600    Assessment: INR 2.0 at 0921 hours today. Drug-drug interaction potential with warfarin + phenytoin  which can increase clearance of warfarin accounting for the downward trend in INR response.  Goal of Therapy:  INR 2-3  Plan: 7.5 mg warfarin today at 1600 hours.    Kenda Paula, PharmD, CPP Clinical Pharmacist Practitioner  10/22/2023,11:08 AM

## 2023-10-22 NOTE — Plan of Care (Signed)

## 2023-10-23 DIAGNOSIS — J9601 Acute respiratory failure with hypoxia: Secondary | ICD-10-CM | POA: Diagnosis not present

## 2023-10-23 LAB — CBC
HCT: 42.6 % (ref 39.0–52.0)
Hemoglobin: 14.1 g/dL (ref 13.0–17.0)
MCH: 31.5 pg (ref 26.0–34.0)
MCHC: 33.1 g/dL (ref 30.0–36.0)
MCV: 95.1 fL (ref 80.0–100.0)
Platelets: 249 10*3/uL (ref 150–400)
RBC: 4.48 MIL/uL (ref 4.22–5.81)
RDW: 13.9 % (ref 11.5–15.5)
WBC: 8.4 10*3/uL (ref 4.0–10.5)
nRBC: 0 % (ref 0.0–0.2)

## 2023-10-23 LAB — BASIC METABOLIC PANEL WITH GFR
Anion gap: 13 (ref 5–15)
BUN: 12 mg/dL (ref 8–23)
CO2: 30 mmol/L (ref 22–32)
Calcium: 8.7 mg/dL — ABNORMAL LOW (ref 8.9–10.3)
Chloride: 97 mmol/L — ABNORMAL LOW (ref 98–111)
Creatinine, Ser: 0.58 mg/dL — ABNORMAL LOW (ref 0.61–1.24)
GFR, Estimated: >60 mL/min (ref >60)
Glucose, Bld: 119 mg/dL — ABNORMAL HIGH (ref 70–99)
Potassium: 3.4 mmol/L — ABNORMAL LOW (ref 3.5–5.1)
Sodium: 140 mmol/L (ref 135–145)

## 2023-10-23 LAB — CULTURE, BLOOD (ROUTINE X 2): Special Requests: ADEQUATE

## 2023-10-23 LAB — GLUCOSE, CAPILLARY: Glucose-Capillary: 95 mg/dL (ref 70–99)

## 2023-10-23 LAB — URINE CULTURE: Culture: >=100000 — AB

## 2023-10-23 LAB — PROTIME-INR
INR: 1.8 — ABNORMAL HIGH (ref 0.8–1.2)
Prothrombin Time: 20.9 s — ABNORMAL HIGH (ref 11.4–15.2)

## 2023-10-23 MED ORDER — WARFARIN SODIUM 7.5 MG PO TABS
7.5000 mg | ORAL_TABLET | Freq: Once | ORAL | Status: DC
  Filled 2023-10-23: qty 1

## 2023-10-23 MED ORDER — POLYVINYL ALCOHOL 1.4 % OP SOLN: 1.0000 [drp] | Freq: Four times a day (QID) | OPHTHALMIC | Status: DC

## 2023-10-23 MED ORDER — GERHARDT'S BUTT CREAM: 1.0000 | TOPICAL_CREAM | CUTANEOUS | Status: DC | PRN

## 2023-10-23 MED ORDER — IPRATROPIUM-ALBUTEROL 0.5-2.5 (3) MG/3ML IN SOLN: 3.0000 mL | RESPIRATORY_TRACT | Status: AC | PRN

## 2023-10-23 MED ORDER — NITROFURANTOIN MONOHYD MACRO 100 MG PO CAPS: 100.0000 mg | ORAL_CAPSULE | Freq: Two times a day (BID) | ORAL | Status: AC

## 2023-10-23 MED ORDER — PREDNISONE 10 MG PO TABS
40.0000 mg | ORAL_TABLET | Freq: Every day | ORAL | Status: AC
Start: 2023-10-23 — End: 2023-10-27

## 2023-10-23 MED ORDER — FLUTICASONE FUROATE-VILANTEROL 100-25 MCG/ACT IN AEPB
1.0000 | INHALATION_SPRAY | Freq: Every day | RESPIRATORY_TRACT | Status: DC
Start: 2023-10-23 — End: 2023-12-06

## 2023-10-23 MED ORDER — MUPIROCIN 2 % EX OINT
1.0000 | TOPICAL_OINTMENT | Freq: Two times a day (BID) | CUTANEOUS | Status: DC
Start: 2023-10-23 — End: 2023-12-06

## 2023-10-23 NOTE — Progress Notes (Signed)
 PHARMACY - ANTICOAGULATION CONSULT NOTE  Pharmacy Consult for warfarin management. Indication:  atrial fibrillation, Proteins C & S deficiency and lupus anticoagulant diagnoses from past medical history with laboratory confirmation and hematology consult.   Allergies  Allergen Reactions   Fenofibrate Other (See Comments)    Per MAR    Patient Measurements: Height: 6\' 1"  (185.4 cm) Weight: 133.5 kg (294 lb 6.4 oz) IBW/kg (Calculated) : 79.9 HEPARIN  DW (KG): 110.1  Vital Signs: Temp: 98 F (36.7 C) (05/15 2000) Temp Source: Oral (05/15 2000) BP: 84/59 (05/15 2000) Pulse Rate: 67 (05/16 0318)  Labs: Recent Labs    10/20/23 1324 10/20/23 1331 10/20/23 1524 10/20/23 1719 10/20/23 1840 10/20/23 2154 10/21/23 0625 10/21/23 1013 10/21/23 2331 10/22/23 0921 10/22/23 1153 10/23/23 0608  HGB 13.9 13.9  14.3  --  13.9  --   --  13.4  --   --   --   --   --   HCT 43.4 41.0  42.0  --  41.0  --   --  41.6  --   --   --   --   --   PLT 266  --   --   --   --   --  224  --   --   --   --   --   LABPROT 26.1*  --   --   --   --   --  24.0*  --  23.8* 22.9*  --  20.9*  INR 2.4*  --   --   --   --   --  2.1*  --  2.1* 2.0*  --  1.8*  CREATININE 0.61 0.80  --   --   --   --   --  0.61  --   --  0.52*  --   TROPONINIHS 36*  --  42*  --  41* 39*  --   --   --   --   --   --     Estimated Creatinine Clearance: 131.9 mL/min (A) (by C-G formula based on SCr of 0.52 mg/dL (L)).   Medical History: Past Medical History:  Diagnosis Date   Acute on chronic congestive heart failure (HCC)    Anemia, secondary    SECONDARY TO ACUTE BLOOD LOSS   Anxiety disorder    Bloody stool 08/29/2011   intermittent along with constipation.    CVA (cerebral vascular accident) (HCC) 2010   large left MCA stroke with right hemiparesis   Depression    DVT of lower extremity (deep venous thrombosis) (HCC)    RIGHT LOWER; s/p IVC filter 7/12   Dysphagia    Hyperlipidemia    Hypertension    Lupus  anticoagulant disorder (HCC) 1990   Morbid obesity (HCC)    PFO (patent foramen ovale)    TEE 2/10: EF 60%, trivial AI, mild Ao root dilatation, mod PFO with R-L shunting, atrial septal aneurysm;   echo 7/12: EF 65-70%, grade 1 diast dysfxn, mild MR, LVOT showed severe obstruction   Protein C deficiency (HCC)    Protein S deficiency (HCC)    Rectus sheath hematoma 7/12   required reversal of anticoagulation and c/b DVT req. IVC filter   Seizure disorder (HCC)     Medications:  Scheduled:   arformoterol  15 mcg Nebulization BID   budesonide (PULMICORT) nebulizer solution  0.5 mg Nebulization BID   Chlorhexidine  Gluconate Cloth  6 each Topical Daily   cycloSPORINE   1  drop Both Eyes BID   DULoxetine  30 mg Oral QHS   empagliflozin   10 mg Oral Daily   ezetimibe   10 mg Oral Daily   methylPREDNISolone (SOLU-MEDROL) injection  40 mg Intravenous Q24H   mupirocin  ointment  1 Application Nasal BID   pantoprazole   40 mg Oral Daily   phenytoin   50 mg Oral QHS   phenytoin   200 mg Oral BID   polyvinyl alcohol   1 drop Both Eyes QID   revefenacin  175 mcg Nebulization Daily   rosuvastatin   40 mg Oral QHS   spironolactone   25 mg Oral q AM   torsemide   40 mg Oral BID   traZODone   50 mg Oral QHS   Warfarin - Pharmacist Dosing Inpatient   Does not apply q1600    Assessment: INR 1.8 at 0608 today following continued dose escalation. Phenytoin  may be acting to decrease INR response (necessitating dose escalation of warfarin) because of the potential of phenytoin 's clearance to be increased.  Goal of Therapy:  INR 2-3   Plan:  Warfarin 7.5 mg PO at 1600 hours today.   Kenda Paula, PharmD, CPP Clinical Pharmacist Practitioner 10/23/2023,7:07 AM

## 2023-10-23 NOTE — Evaluation (Signed)
 Physical Therapy Brief Evaluation and Discharge Note Patient Details Name: James Villegas MRN: [id number] DOB: 23-Sep-1955 Today's Date: 10/23/2023   History of Present Illness  Pt is a 68 y.o. M who presents to ED 5/13 from [facility] for diaphoresis, generalized weakness. Admitted with acute hypoxic hypercarbia. He was started on bipap and given Lasix . Significant PMH: CVA with right sided hemiplegia, Afib/flutter (on Warfarin), HFpEF, chronic respiratory failure with hypercarbia, HLD, HTN, epilepsy, protein C&S deficiency, lupus anticoagulant, unspecified mood disorder and obesity.  Clinical Impression  Pt admitted from [facility]. Pt reports he requires a hoyer lift to the w/c, typically sits up from 11-3 about 6 days/wk. Pt reports he was discharged from therapy services. Pt appears to be close to his functional baseline. Able to roll towards R with contact guard assist and +2 utilized for repositioning in bed due to body habitus and right sided weakness. Pt declining out of bed or theraband exercises for LUE. Recommend out of bed via hoyer lift with nursing staff as tolerated. No further acute or follow up PT needs recommended; thank you for this consult.      PT Assessment Patient does not need any further PT services  Assistance Needed at Discharge  Frequent or constant Supervision/Assistance    Equipment Recommendations None recommended by PT  Recommendations for Other Services       Precautions/Restrictions Precautions Precautions: Fall;Other (comment) Precaution/Restrictions Comments: R hemi Restrictions Weight Bearing Restrictions Per Provider Order: No        Mobility  Bed Mobility Rolling: Contact guard assist     General bed mobility comments: Rolls to R with CGA, more difficulty rolling to L  Transfers                   General transfer comment: deferred by pt    Ambulation/Gait              Home Activity Instructions    Stairs             Modified Rankin (Stroke Patients Only)        Balance                          Pertinent Vitals/Pain PT - Brief Vital Signs All Vital Signs Stable: Other (comment) (SpO2 87-89% on 4L O2) Pain Assessment Pain Assessment: No/denies pain     Home Living Family/patient expects to be discharged to:: Skilled nursing facility             Additional Comments: James Villegas    Prior Function Level of Independence: Needs assistance Comments: Uses hoyer lift at baseline, sits up in w/c 11-3 6 days/wk. Does not get PT    UE/LE Assessment   UE ROM/Strength/Tone/Coordination: Impaired UE ROM/Strength/Tone/Coordination Deficits: RUE hemiplegia at baseline  LE ROM/Strength/Tone/Coordination: Impaired LE ROM/Strength/Tone/Coordination Deficits: RLE hemiplegia at baseline, LLE able to perform limited SLR    Communication   Communication Communication: No apparent difficulties     Cognition Overall Cognitive Status: Appears within functional limits for tasks assessed/performed       General Comments      Exercises     Assessment/Plan    PT Problem List         PT Visit Diagnosis Other abnormalities of gait and mobility (R26.89)    No Skilled PT Patient at baseline level of functioning;Patient will have necessary level of assist by caregiver at discharge   Co-evaluation  AMPAC 6 Clicks Help needed turning from your back to your side while in a flat bed without using bedrails?: A Little Help needed moving from lying on your back to sitting on the side of a flat bed without using bedrails?: Total Help needed moving to and from a bed to a chair (including a wheelchair)?: Total Help needed standing up from a chair using your arms (e.g., wheelchair or bedside chair)?: Total Help needed to walk in hospital room?: Total Help needed climbing 3-5 steps with a railing? : Total 6 Click Score: 8      End of Session Equipment Utilized  During Treatment: Oxygen Activity Tolerance: Patient tolerated treatment well Patient left: in bed;with call bell/phone within reach Nurse Communication: Mobility status PT Visit Diagnosis: Other abnormalities of gait and mobility (R26.89)     Time: 4098-1191 PT Time Calculation (min) (ACUTE ONLY): 13 min  Charges:   PT Evaluation $PT Eval Low Complexity: 1 Low      James Villegas, PT, DPT Acute Rehabilitation Services Office [phone]   James Villegas  10/23/2023, 11:19 AM

## 2023-10-23 NOTE — Plan of Care (Signed)

## 2023-10-23 NOTE — TOC Transition Note (Signed)
 Transition of Care [facility]) - Discharge Note   Patient Details  Name: James Villegas MRN: [id number] Date of Birth: [birth year]  Transition of [facility]) CM/SW Contact:  Valery Gaucher, LCSW Phone Number: 10/23/2023, 12:26 PM   Clinical Narrative:     Patient will Discharge to: [facility] Discharge Date:10/22/23 Family Notified: sister Transport OZ:[id number]  Per MD patient is ready for discharge. RN, patient, and facility notified of discharge. Discharge Summary sent to facility. RN given number for [phone]. Ambulance transport requested for patient.   Clinical Social Worker signing off.  Liddie Reel, MSW, LCSW Clinical Social Worker     Final next level of care: Skilled Nursing Facility Barriers to Discharge: Barriers Resolved   Patient Goals and CMS Choice            Discharge Placement              Patient chooses bed at: [facility] Patient to be transferred to facility by: [facility] Name of family member notified: sister Patient and family notified of of transfer: 10/23/23  Discharge Plan and Services Additional resources added to the After Visit Summary for                                       Social Drivers of Health (SDOH) Interventions SDOH Screenings   Food Insecurity: No Food Insecurity (10/21/2023)  Housing: Low Risk  (10/21/2023)  Transportation Needs: No Transportation Needs (10/21/2023)  Utilities: Not At Risk (10/21/2023)  Social Connections: Unknown (10/21/2023)  Tobacco Use: Medium Risk (10/20/2023)     Readmission Risk Interventions    08/26/2022   11:56 AM  Readmission Risk Prevention Plan  Transportation Screening Complete  PCP or Specialist Appt within 5-7 Days Complete  Home Care Screening Complete  Medication Review (RN CM) Referral to Pharmacy

## 2023-10-23 NOTE — Progress Notes (Signed)
 Subjective:  NAEO. More conversant this morning. Says breathing is improvement since admission. On 4L [place] when speaking with him. Reports only using CPAP at night at [facility] 2-3x a week because it makes his mouth/nose dry, encouraged more frequent use when he goes home. Denies pain with urination, back pain or lower abdominal pain.   Objective:  Vital signs in last 24 hours: Vitals:   10/22/23 2000 10/22/23 2009 10/22/23 2246 10/23/23 0318  BP: (!) 84/59     Pulse: 89 91 92 67  Resp: 16 20 19    Temp: 98 F (36.7 C)     TempSrc: Oral     SpO2: 96%     Weight:      Height:       Weight change:   Intake/Output Summary (Last 24 hours) at 10/23/2023 1324 Last data filed at 10/22/2023 1830 Gross per 24 hour  Intake 1180 ml  Output 1600 ml  Net -420 ml   Constitutional: Laying in bed, no acute distress. HENT: normocephalic atraumatic, mucous membranes moist Cardiovascular: Regular rate and rhythm Pulmonary/Chest: Poor respiratory effort, no crackles/rhonchi.  Abdominal: soft, non-tender, non-distended Neurological: Alert and oriented x3 MSK: Right sided upper and lower strength 0/5, left sided upper and lower strength 5/5 Extremities: Trace pitting edema b/l LE Psych: Normal mood and affect  Assessment/Plan: Principal Problem:   Acute hypoxic respiratory failure (HCC) Active Problems:   Cerebral infarction (HCC)   DVT (deep venous thrombosis) (HCC)   Permanent atrial fibrillation (HCC)   Seizure disorder (HCC)   History of CVA with residual deficit   Acute on chronic heart failure with preserved ejection fraction (HCC)  Acute on Chronic Hypoxic Hypercarbic respiratory failure Obstructive Pulmonary Disease Current episode of acute hypoxic hypercarbia most likely 2/2 chronic OHS/untreated OSA. Satted well overnight on BiPAP intermittently. Pulm saw him yesterday, transition him from Ellipta to triple therapy nebs. Diuresing well on home Torsemide . Patient is now stable  for discharge, working with social work to coordinate transition back to [facility]. Have consulted palliative care for goals of care conversation with sister if not discharged to [facility] today. - Pulm consulted, appreciate recs - Brovana & budesonide nebs for OHS/OSA - Titrate [place] as needed - CPAP at night and with naps - Continue home Torsemide  40 mg BID - Continue Spironolactone  25 mg daily - Continue PO Jardiance  10 mg daily  - Echo (5/14): EF 60-75%, moderate LV hypertrophy, mildly elevated pulmonary artery pressure with elevated R atrial pressure of 15 > rpt yesterday without acute changes - Telemetry - BCx: 1/2 with staph species, most likely contaminant - Strict I&Os, daily weights - Palliative Care consulted    Atrial Fibrillation/Atrial Flutter EKG in ED with RBBB with posterior fascicular block with afib, consistent with prior on Oct 19, 2022. On Warfarin at home, INR in ED 2.4. - Warfarin per pharmacy  UTI UA with LE with moderate bacteria, Ucx >100,000 E.Coli, no leukocytosis, remains afebrile without dysuria, CVA tenderness or pain with palpation of bladder. Likely a chronic colonizer given incontinent.  - s/p IV CTX yest, will transition to PO CTX for 3 total doses - Condom catheter   HTN BP are soft, likely 2/2 increased intrathoracic pressure from continuous oxygen use.  - Holding home Losartan in the setting of normal pressures   Protein C&S Deficiency Lupus Anticoagulant On Warfarin at home for Afib, protein C&S and LA. INR in ED 2.4. Denies recent falls, hematochezia, melena, hematuria, or any other concern for an acute bleed.  Goal INR for protein C&S deficiency is 2-3, therefore INR is at goal.  - Continue home warfarin per pharmacy   Epilepsy - Continue home Phenytoin  200 mg BID + 50 mg chewable tablet at bedtime   Depression - Continue home Cymbalta 30 mg - Continue home Trazadone 50 mg qhs   LOS: 2 days   Raylynn Hersh, Nana Avers, Medical  Student 10/23/2023, 6:08 AM

## 2023-10-23 NOTE — Plan of Care (Signed)

## 2023-10-23 NOTE — Discharge Summary (Signed)
 Name: James Villegas MRN: [id number] DOB: [birth year] 68 y.o. PCP: Hoover Luz, MD  Date of Admission: 10/20/2023  1:05 PM Date of Discharge: 10/23/2023 11:02 AM Attending Physician: Dr. Alwin Baars  Discharge Diagnosis: Principal Problem:   Acute hypoxic respiratory failure (HCC) Active Problems:   Cerebral infarction (HCC)   DVT (deep venous thrombosis) (HCC)   Permanent atrial fibrillation (HCC)   Seizure disorder (HCC)   History of CVA with residual deficit   Acute on chronic heart failure with preserved ejection fraction [facility])    Discharge Medications: Allergies as of 10/23/2023       Reactions   Fenofibrate Other (See Comments)   Per MAR        Medication List     PAUSE taking these medications    losartan 25 MG tablet Wait to take this until your doctor or other care provider tells you to start again. Commonly known as: COZAAR Take 12.5 mg by mouth daily.       TAKE these medications    acetaminophen  325 MG tablet Commonly known as: TYLENOL  Take 650 mg by mouth every 6 (six) hours as needed for mild pain, headache or fever.   antiseptic oral rinse Liqd 1 Application by Mouth Rinse route at bedtime.   Baclofen  5 MG Tabs Take 5 mg by mouth at bedtime.   bisacodyl  5 MG EC tablet Commonly known as: DULCOLAX Take 10 mg by mouth daily as needed for moderate constipation.   cycloSPORINE  0.05 % ophthalmic emulsion Commonly known as: RESTASIS  Place 1 drop into both eyes 2 (two) times daily.   Dilantin  Infatabs 50 MG tablet Generic drug: phenytoin  Chew 50 mg by mouth at bedtime. Give with 200mg  capsules for total dose of 250mg  qhs   DULoxetine 30 MG capsule Commonly known as: CYMBALTA Take 30 mg by mouth in the morning.   ezetimibe  10 MG tablet Commonly known as: ZETIA  Take 10 mg by mouth daily.   fluticasone furoate-vilanterol 100-25 MCG/ACT Aepb Commonly known as: Breo Ellipta Inhale 1 puff into the lungs daily.   Gerhardt's butt cream  Crea Apply 1 Application topically as needed for irritation.   guaiFENesin 100 MG/5ML liquid Commonly known as: ROBITUSSIN Take 15 mLs by mouth every 4 (four) hours as needed for cough.   ipratropium-albuterol  0.5-2.5 (3) MG/3ML Soln Commonly known as: DUONEB Take 3 mLs by nebulization every 4 (four) hours as needed.   Jardiance  10 MG Tabs tablet Generic drug: empagliflozin  Take by mouth daily.   mupirocin  ointment 2 % Commonly known as: BACTROBAN  Place 1 Application into the nose 2 (two) times daily.   nitrofurantoin (macrocrystal-monohydrate) 100 MG capsule Commonly known as: Macrobid Take 1 capsule (100 mg total) by mouth 2 (two) times daily for 5 days.   omeprazole  20 MG capsule Commonly known as: PRILOSEC Take 20 mg by mouth every other day.   OXYGEN Inhale 4 L into the lungs continuous.   phenytoin  100 MG ER capsule Commonly known as: DILANTIN  Take 200 mg by mouth 2 (two) times daily.   polyvinyl alcohol  1.4 % ophthalmic solution Commonly known as: LIQUIFILM TEARS Place 1 drop into both eyes 4 (four) times daily.   potassium chloride  SA 20 MEQ tablet Commonly known as: KLOR-CON  M Take 2 tablets (40 mEq total) by mouth daily.   predniSONE 10 MG tablet Commonly known as: DELTASONE Take 4 tablets (40 mg total) by mouth daily with breakfast for 4 days.   Refresh Optive Advanced 0.5-1-0.5 % Soln Generic  drug: Carboxymeth-Glycerin-Polysorb Place 1 drop into both eyes in the morning, at noon, in the evening, and at bedtime. Wait 3-5 minutes between eye medications   rosuvastatin  40 MG tablet Commonly known as: CRESTOR  Take 40 mg by mouth at bedtime.   spironolactone  25 MG tablet Commonly known as: ALDACTONE  Take 1 tablet (25 mg total) by mouth daily. What changed: when to take this   torsemide  20 MG tablet Commonly known as: DEMADEX  Take 40 mg by mouth in the morning and at bedtime.   traZODone  50 MG tablet Commonly known as: DESYREL  Take 25 mg by mouth  at bedtime.   warfarin 1 MG tablet Commonly known as: COUMADIN  Take as directed. If you are unsure how to take this medication, talk to your nurse or doctor. Original instructions: Take 3.5 mg by mouth daily.               Discharge Care Instructions  (From admission, onward)           Start     Ordered   10/23/23 0000  Discharge wound care:       Comments: Pressure Injury 02/20/20 Buttocks Mid;Right;Left Stage 2 -  Partial thickness loss of dermis presenting as a shallow open injury with a red, pink wound bed without slough. 1340 days    Pressure Injury 09/16/21 Buttocks Right;Left Stage 1 -  Intact skin with non-blanchable redness of a localized area usually over a bony prominence. non-blanchable reddness 766 days   Pressure Injury 10/20/23 Sacrum Medial Stage 1 -  Intact skin with non-blanchable redness of a localized area usually over a bony prominence. non blanchable redness   10/23/23 1102            Disposition and follow-up:   [name] was discharged from [facility] in Stable condition.  At the hospital follow up visit please address:  1.  Follow-up:  *Acute on chronic hypoxic hypercarbic respiratory failure *Acute on chronic HFpEF *OHS/obstructive lung disease  - Ensure Torsemide  dosing daily - Low sodium diet - Monitor electrolytes - Chronic retainer with encephalopathy without cPAP; needs daily use - Double torsemide  dosing if >2lb in one day or >5lb in one week; notify facility physician to assess for IV diuresis need  *UTI - Ensure completion of Nitrofurantoin therapy  *Atrial fibrillation - Monitor Warfarin levels  *Seizures/mood disorder - Monitor phenytoin  level  *HTN - Losartan held during this admission - Resume if appropriate after evaluation  2.  Labs / imaging needed at time of follow-up: CBC, BMP, phenytoin  level, warfarin level  3.  Pending labs/ test needing follow-up: None   Follow-up Appointments:  Should be evaluated by facility physician in ~1 week from discharge   Hospital Course by problem list: DAMERE SOFIA is a 68 y.o. male with PMHx of CVA with right sided hemiplegia, Afib/flutter (on Warfarin), HFpEF, chronic respiratory failure with hypercarbia, HLD, HTN, epilepsy, protein C&S deficiency, lupus anticoagulant, unspecified mood disorder and obesity who presented from [facility] for diaphoresis and generalized weakness. His hospital course is outlined by the pertinent problems below:  Acute on Chronic Hypoxic Hypercarbic respiratory failure Obstructive Pulmonary Disease  This episode of acute hypoxic hypercarbia is most likely secondary to chronic OHS/untreated OSA. There was initial concern for some component of fluid overload, but CXR was negative for any pulmonary edema or pleural effusions and BNP was wnl. Infectious was ruled out given he remained afebrile, WBC was normal and RVP was negative. He  received 80 mg IV Lasix  in the ED. Had PFTs on 08/25/2022 which showed FEV1/FVC of 53% consistent with severe COPD, so he was started on triple therapy per Pulm Crit recs. pCO2 was persistently elevated on BiPAP, but he remained well compensated supporting that he is likely a chronic CO2 retainer. He was given his home POTorsemide 40 mgBID, Spironolactone  25 mg daily and PO Jardiance  10 mg daily with appropriate diuresis. An Echo was obtained that showed EF 65-75% with moderate LV hypertrophy with mildly elevated pulmonary arterial pressure and right atrial dilation with right atrial pressure of 15mm Hg. He reports using CPAP at home only 2-3x a week due to dryness/discomfort. He was satting well on  and deemed stable for discharge. He was encouraged to use it every night and with naps.    Atrial Fibrillation/Atrial Flutter EKG in ED with RBBB with posterior fascicular block with afib, consistent with prior on Oct 19, 2022. Continued on home Warfarin. Will need level at follow  up  UTI UA with LE with moderate bacteria, UCx >100,000 E.Coli, no leukocytosis, remained afebrile without dysuria, CVA tenderness or pain with palpation of bladder. Likely a chronic colonizer given incontinent. Received single dose of IV CTX and discharge with PO nitrofurantoin for 5 days.   Troponin elevation No CP, palpitations or SOB. Troponin trend wnl. EKG similar to prior.    HTN Held home Losartan in the setting of normal/low pressures. Resume after evaluation at facility.   Protein C&S Deficiency Lupus Anticoagulant On Warfarin at home for Afib, protein C&S and lupus anticoagulant. INR in ED 2.4. Warfarin was titrated via pharmacy recs.   Epilepsy Continued on home Phenytoin .   Mood Disorder Continued on home Cymbalta 30 mg and Trazadone 50 mg.    Pertinent Labs, Studies, and Procedures:     Latest Ref Rng & Units 10/23/2023    9:50 AM 10/21/2023    6:25 AM 10/20/2023    5:19 PM  CBC  WBC 4.0 - 10.5 K/uL 8.4  6.0    Hemoglobin 13.0 - 17.0 g/dL 16.1  09.6  04.5   Hematocrit 39.0 - 52.0 % 42.6  41.6  41.0   Platelets 150 - 400 K/uL 249  224         Latest Ref Rng & Units 10/22/2023   11:53 AM 10/21/2023   10:13 AM 10/20/2023    5:19 PM  CMP  Glucose 70 - 99 mg/dL 90  409    BUN 8 - 23 mg/dL 10  9    Creatinine 8.11 - 1.24 mg/dL 9.14  7.82    Sodium 956 - 145 mmol/L 141  138  137   Potassium 3.5 - 5.1 mmol/L 3.6  3.5  3.7   Chloride 98 - 111 mmol/L 93  90    CO2 22 - 32 mmol/L 39  41    Calcium  8.9 - 10.3 mg/dL 8.6  8.7    Total Protein 6.5 - 8.1 g/dL 5.6  6.3    Total Bilirubin 0.0 - 1.2 mg/dL 0.7  0.6    Alkaline Phos 38 - 126 U/L 96  102    AST 15 - 41 U/L 15  19    ALT 0 - 44 U/L 13  14      ECHOCARDIOGRAM COMPLETE Result Date: 10/21/2023    ECHOCARDIOGRAM REPORT   Patient Name:   Kristen B Bublitz Date of Exam: 10/21/2023 Medical Rec #:  [id number]      Height:  73.0 in Accession #:    [id number]     Weight:       294.4 lb Date of Birth:  1955-11-18      BSA:          2.536 m Patient Age:    67 years       BP:           100/73 mmHg Patient Gender: M              HR:           69 bpm. Exam Location:  Inpatient Procedure: 2D Echo, Cardiac Doppler and Color Doppler (Both Spectral and Color            Flow Doppler were utilized during procedure). Indications:    Dyspnea R06.00  History:        Patient has prior history of Echocardiogram examinations, most                 recent 09/16/2021. CHF; Risk Factors:Hypertension.  Sonographer:    Astrid Blamer Referring Phys: [id number] JEFFREY C HATCHER  Sonographer Comments: Technically difficult study due to poor echo windows. Image acquisition challenging due to respiratory motion and Image acquisition challenging due to patient body habitus. IMPRESSIONS  1. Left ventricular ejection fraction, by estimation, is 65 to 70%. The left ventricle has normal function. Left ventricular endocardial border not optimally defined to evaluate regional wall motion. There is moderate left ventricular hypertrophy. Left ventricular diastolic parameters are indeterminate.  2. Image are foreshortened, LV apex is crowded. Possible prominent trabeculations and increased wall thickening at apex. Apex cannot be fully evaluated due to image quality.  3. Right ventricular systolic function is normal. The right ventricular size is normal. There is mildly elevated pulmonary artery systolic pressure. The estimated right ventricular systolic pressure is 40.8 mmHg.  4. Left atrial size was mildly dilated.  5. The mitral valve is grossly normal. Trivial mitral valve regurgitation. No evidence of mitral stenosis.  6. The aortic valve is grossly normal. Aortic valve regurgitation is mild. Aortic valve sclerosis is present, with no evidence of aortic valve stenosis.  7. Aortic dilatation noted. There is mild dilatation of the ascending aorta, measuring 43 mm.  8. The inferior vena cava is dilated in size with <50% respiratory variability, suggesting right atrial  pressure of 15 mmHg. FINDINGS  Left Ventricle: Left ventricular ejection fraction, by estimation, is 65 to 70%. The left ventricle has normal function. Left ventricular endocardial border not optimally defined to evaluate regional wall motion. The left ventricular internal cavity size was normal in size. There is moderate left ventricular hypertrophy. Left ventricular diastolic parameters are indeterminate. Right Ventricle: The right ventricular size is normal. No increase in right ventricular wall thickness. Right ventricular systolic function is normal. There is mildly elevated pulmonary artery systolic pressure. The tricuspid regurgitant velocity is 2.54  m/s, and with an assumed right atrial pressure of 15 mmHg, the estimated right ventricular systolic pressure is 40.8 mmHg. Left Atrium: Left atrial size was mildly dilated. Right Atrium: Right atrial size was normal in size. Pericardium: There is no evidence of pericardial effusion. Mitral Valve: The mitral valve is grossly normal. Trivial mitral valve regurgitation. No evidence of mitral valve stenosis. Tricuspid Valve: The tricuspid valve is normal in structure. Tricuspid valve regurgitation is mild . No evidence of tricuspid stenosis. Aortic Valve: The aortic valve is grossly normal. Aortic valve regurgitation is mild. Aortic regurgitation PHT measures 837 msec. Aortic valve sclerosis is  present, with no evidence of aortic valve stenosis. Aortic valve mean gradient measures 5.0 mmHg. Aortic valve peak gradient measures 9.0 mmHg. Aortic valve area, by VTI measures 2.83 cm. Pulmonic Valve: The pulmonic valve was not well visualized. Pulmonic valve regurgitation is trivial. No evidence of pulmonic stenosis. Aorta: The aortic root is normal in size and structure and aortic dilatation noted. There is mild dilatation of the ascending aorta, measuring 43 mm. Venous: The inferior vena cava is dilated in size with less than 50% respiratory variability, suggesting  right atrial pressure of 15 mmHg. IAS/Shunts: The interatrial septum was not well visualized.  LEFT VENTRICLE PLAX 2D LVIDd:         5.50 cm   Diastology LVIDs:         3.80 cm   LV e' medial:    4.90 cm/s LV PW:         1.50 cm   LV E/e' medial:  22.0 LV IVS:        1.50 cm   LV e' lateral:   8.38 cm/s LVOT diam:     2.00 cm   LV E/e' lateral: 12.9 LV SV:         69 LV SV Index:   27 LVOT Area:     3.14 cm  RIGHT VENTRICLE             IVC RV S prime:     13.10 cm/s  IVC diam: 2.50 cm TAPSE (M-mode): 1.9 cm LEFT ATRIUM           Index LA Vol (A2C): 40.2 ml 15.85 ml/m LA Vol (A4C): 78.4 ml 30.92 ml/m  AORTIC VALVE AV Area (Vmax):    3.08 cm AV Area (Vmean):   2.70 cm AV Area (VTI):     2.83 cm AV Vmax:           150.00 cm/s AV Vmean:          108.000 cm/s AV VTI:            0.245 m AV Peak Grad:      9.0 mmHg AV Mean Grad:      5.0 mmHg LVOT Vmax:         147.00 cm/s LVOT Vmean:        92.900 cm/s LVOT VTI:          0.221 m LVOT/AV VTI ratio: 0.90 AI PHT:            837 msec  AORTA Ao Root diam: 3.90 cm Ao Asc diam:  4.30 cm MITRAL VALVE                TRICUSPID VALVE MV Area (PHT): 3.54 cm     TR Peak grad:   25.8 mmHg MV Decel Time: 214 msec     TR Vmax:        254.00 cm/s MV E velocity: 108.00 cm/s                             SHUNTS                             Systemic VTI:  0.22 m                             Systemic Diam: 2.00 cm Grady Lawman MD Electronically signed  by Grady Lawman MD Signature Date/Time: 10/21/2023/12:53:20 PM    Final    DG Chest Port 1 View Result Date: 10/20/2023 CLINICAL DATA:  Shortness of breath. EXAM: PORTABLE CHEST 1 VIEW COMPARISON:  08/21/2022. FINDINGS: Low lung volume. Bilateral lung fields are clear. Bilateral costophrenic angles are clear. Note is made of elevated right hemidiaphragm. Stable cardio-mediastinal silhouette. No acute osseous abnormalities. The soft tissues are within normal limits. IMPRESSION: No active disease. Electronically Signed   By: Beula Brunswick M.D.   On: 10/20/2023 13:42     Discharge Instructions: Discharge Instructions     (HEART FAILURE PATIENTS) Call MD:  Anytime you have any of the following symptoms: 1) 3 pound weight gain in 24 hours or 5 pounds in 1 week 2) shortness of breath, with or without a dry hacking cough 3) swelling in the hands, feet or stomach 4) if you have to sleep on extra pillows at night in order to breathe.   Complete by: As directed    Call MD for:  difficulty breathing, headache or visual disturbances   Complete by: As directed    Call MD for:  persistant nausea and vomiting   Complete by: As directed    Call MD for:  temperature >100.4   Complete by: As directed    Diet - low sodium heart healthy   Complete by: As directed    Discharge instructions   Complete by: As directed    [name],    You were admitted to the hospital because you were having generalized weakness and increased sweating. In the Emergency Department you were found to have increased work of breathing and were placed on a face mask (BiPAP) to help you breathe better. While you were in the hospital, you continued to receive additional oxygen and your home fluid pills as well as started on an inhaler with improvement in your breathing. You were also found to have a urinary tract infection (UTI) and given appropriate antibiotics.   You are now stable to go back to [facility] with the following instructions: - Start Breo Ellipta - or the formulary equivalent of this medication, one puff every day - Start a 5-day course of nitrofurantoin for your urinary tract infection for 5 days   - Continue the rest of your home medications as prescribed   - Please use your CPAP every night and with naps - this will help you remain awake and prevent confusion.    If your symptoms return, please so not hesitate to call our clinic at [phone] or head to the nearest Emergency Department.   Take Care.   [name], MS4 Dr.  Cathey Clunes, MD   Discharge wound care:   Complete by: As directed    Pressure Injury 02/20/20 Buttocks Mid;Right;Left Stage 2 -  Partial thickness loss of dermis presenting as a shallow open injury with a red, pink wound bed without slough. 1340 days    Pressure Injury 09/16/21 Buttocks Right;Left Stage 1 -  Intact skin with non-blanchable redness of a localized area usually over a bony prominence. non-blanchable reddness 766 days   Pressure Injury 10/20/23 Sacrum Medial Stage 1 -  Intact skin with non-blanchable redness of a localized area usually over a bony prominence. non blanchable redness   Increase activity slowly   Complete by: As directed        Signed: Marieta Shorten, MS4 Cathey Clunes, MD [facility] - PGY2 10/23/2023,

## 2023-10-25 LAB — CULTURE, BLOOD (ROUTINE X 2): Culture: NO GROWTH

## 2023-12-06 ENCOUNTER — Emergency Department (HOSPITAL_COMMUNITY)

## 2023-12-06 ENCOUNTER — Inpatient Hospital Stay (HOSPITAL_COMMUNITY)
Admission: EM | Admit: 2023-12-06 | Discharge: 2023-12-08 | DRG: 291 | Disposition: A | Discharge status: Skilled Nursing Facility | Source: Skilled Nursing Facility | Attending: Internal Medicine | Admitting: Internal Medicine

## 2023-12-06 ENCOUNTER — Encounter (HOSPITAL_COMMUNITY): Payer: Self-pay

## 2023-12-06 ENCOUNTER — Encounter (HOSPITAL_COMMUNITY)

## 2023-12-06 ENCOUNTER — Other Ambulatory Visit: Payer: Self-pay

## 2023-12-06 DIAGNOSIS — Z6839 Body mass index (BMI) 39.0-39.9, adult: Secondary | ICD-10-CM

## 2023-12-06 DIAGNOSIS — F32A Depression, unspecified: Secondary | ICD-10-CM | POA: Diagnosis present

## 2023-12-06 DIAGNOSIS — G40909 Epilepsy, unspecified, not intractable, without status epilepticus: Secondary | ICD-10-CM

## 2023-12-06 DIAGNOSIS — Z95828 Presence of other vascular implants and grafts: Secondary | ICD-10-CM

## 2023-12-06 DIAGNOSIS — K219 Gastro-esophageal reflux disease without esophagitis: Secondary | ICD-10-CM | POA: Diagnosis present

## 2023-12-06 DIAGNOSIS — J9621 Acute and chronic respiratory failure with hypoxia: Principal | ICD-10-CM | POA: Diagnosis present

## 2023-12-06 DIAGNOSIS — Z9981 Dependence on supplemental oxygen: Secondary | ICD-10-CM

## 2023-12-06 DIAGNOSIS — I5033 Acute on chronic diastolic (congestive) heart failure: Secondary | ICD-10-CM | POA: Diagnosis present

## 2023-12-06 DIAGNOSIS — I69951 Hemiplegia and hemiparesis following unspecified cerebrovascular disease affecting right dominant side: Secondary | ICD-10-CM

## 2023-12-06 DIAGNOSIS — Z7984 Long term (current) use of oral hypoglycemic drugs: Secondary | ICD-10-CM

## 2023-12-06 DIAGNOSIS — I4510 Unspecified right bundle-branch block: Secondary | ICD-10-CM | POA: Diagnosis present

## 2023-12-06 DIAGNOSIS — Z86718 Personal history of other venous thrombosis and embolism: Secondary | ICD-10-CM

## 2023-12-06 DIAGNOSIS — F419 Anxiety disorder, unspecified: Secondary | ICD-10-CM | POA: Diagnosis present

## 2023-12-06 DIAGNOSIS — J9622 Acute and chronic respiratory failure with hypercapnia: Secondary | ICD-10-CM | POA: Diagnosis present

## 2023-12-06 DIAGNOSIS — I509 Heart failure, unspecified: Secondary | ICD-10-CM | POA: Diagnosis not present

## 2023-12-06 DIAGNOSIS — Z87891 Personal history of nicotine dependence: Secondary | ICD-10-CM

## 2023-12-06 DIAGNOSIS — G9341 Metabolic encephalopathy: Secondary | ICD-10-CM | POA: Diagnosis not present

## 2023-12-06 DIAGNOSIS — G4700 Insomnia, unspecified: Secondary | ICD-10-CM | POA: Diagnosis present

## 2023-12-06 DIAGNOSIS — I4892 Unspecified atrial flutter: Secondary | ICD-10-CM

## 2023-12-06 DIAGNOSIS — I69351 Hemiplegia and hemiparesis following cerebral infarction affecting right dominant side: Secondary | ICD-10-CM

## 2023-12-06 DIAGNOSIS — Z79899 Other long term (current) drug therapy: Secondary | ICD-10-CM

## 2023-12-06 DIAGNOSIS — Z7951 Long term (current) use of inhaled steroids: Secondary | ICD-10-CM

## 2023-12-06 DIAGNOSIS — E662 Morbid (severe) obesity with alveolar hypoventilation: Secondary | ICD-10-CM | POA: Diagnosis present

## 2023-12-06 DIAGNOSIS — I110 Hypertensive heart disease with heart failure: Principal | ICD-10-CM

## 2023-12-06 DIAGNOSIS — D6862 Lupus anticoagulant syndrome: Secondary | ICD-10-CM | POA: Diagnosis present

## 2023-12-06 DIAGNOSIS — L03115 Cellulitis of right lower limb: Secondary | ICD-10-CM | POA: Diagnosis not present

## 2023-12-06 DIAGNOSIS — E785 Hyperlipidemia, unspecified: Secondary | ICD-10-CM | POA: Diagnosis present

## 2023-12-06 DIAGNOSIS — I4891 Unspecified atrial fibrillation: Secondary | ICD-10-CM | POA: Diagnosis not present

## 2023-12-06 DIAGNOSIS — Z8619 Personal history of other infectious and parasitic diseases: Secondary | ICD-10-CM

## 2023-12-06 DIAGNOSIS — Z888 Allergy status to other drugs, medicaments and biological substances status: Secondary | ICD-10-CM

## 2023-12-06 DIAGNOSIS — Z7901 Long term (current) use of anticoagulants: Secondary | ICD-10-CM

## 2023-12-06 DIAGNOSIS — Q2112 Patent foramen ovale: Secondary | ICD-10-CM

## 2023-12-06 DIAGNOSIS — E441 Mild protein-calorie malnutrition: Secondary | ICD-10-CM

## 2023-12-06 LAB — CBC WITH DIFFERENTIAL/PLATELET
Abs Immature Granulocytes: 0.17 10*3/uL — ABNORMAL HIGH (ref 0.00–0.07)
Basophils Absolute: 0.1 10*3/uL (ref 0.0–0.1)
Basophils Relative: 1 %
Eosinophils Absolute: 0 10*3/uL (ref 0.0–0.5)
Eosinophils Relative: 0 %
HCT: 45.5 % (ref 39.0–52.0)
Hemoglobin: 14.4 g/dL (ref 13.0–17.0)
Immature Granulocytes: 2 %
Lymphocytes Relative: 5 %
Lymphs Abs: 0.5 10*3/uL — ABNORMAL LOW (ref 0.7–4.0)
MCH: 31 pg (ref 26.0–34.0)
MCHC: 31.6 g/dL (ref 30.0–36.0)
MCV: 97.8 fL (ref 80.0–100.0)
Monocytes Absolute: 1.1 10*3/uL — ABNORMAL HIGH (ref 0.1–1.0)
Monocytes Relative: 11 %
Neutro Abs: 9 10*3/uL — ABNORMAL HIGH (ref 1.7–7.7)
Neutrophils Relative %: 81 %
Platelets: 226 10*3/uL (ref 150–400)
RBC: 4.65 MIL/uL (ref 4.22–5.81)
RDW: 14.3 % (ref 11.5–15.5)
WBC: 10.9 10*3/uL — ABNORMAL HIGH (ref 4.0–10.5)
nRBC: 0 % (ref 0.0–0.2)

## 2023-12-06 LAB — I-STAT VENOUS BLOOD GAS, ED
Acid-Base Excess: 13 mmol/L — ABNORMAL HIGH (ref 0.0–2.0)
Bicarbonate: 40.4 mmol/L — ABNORMAL HIGH (ref 20.0–28.0)
Calcium, Ion: 1.04 mmol/L — ABNORMAL LOW (ref 1.15–1.40)
HCT: 44 % (ref 39.0–52.0)
Hemoglobin: 15 g/dL (ref 13.0–17.0)
O2 Saturation: 99 %
Potassium: 4 mmol/L (ref 3.5–5.1)
Sodium: 136 mmol/L (ref 135–145)
TCO2: 42 mmol/L — ABNORMAL HIGH (ref 22–32)
pCO2, Ven: 63.9 mmHg — ABNORMAL HIGH (ref 44–60)
pH, Ven: 7.409 (ref 7.25–7.43)
pO2, Ven: 137 mmHg — ABNORMAL HIGH (ref 32–45)

## 2023-12-06 LAB — COMPREHENSIVE METABOLIC PANEL WITH GFR
ALT: 13 U/L (ref 0–44)
AST: 17 U/L (ref 15–41)
Albumin: 2.9 g/dL — ABNORMAL LOW (ref 3.5–5.0)
Alkaline Phosphatase: 105 U/L (ref 38–126)
Anion gap: 13 (ref 5–15)
BUN: 7 mg/dL — ABNORMAL LOW (ref 8–23)
CO2: 32 mmol/L (ref 22–32)
Calcium: 8.4 mg/dL — ABNORMAL LOW (ref 8.9–10.3)
Chloride: 93 mmol/L — ABNORMAL LOW (ref 98–111)
Creatinine, Ser: 0.58 mg/dL — ABNORMAL LOW (ref 0.61–1.24)
GFR, Estimated: >60 mL/min (ref >60)
Glucose, Bld: 132 mg/dL — ABNORMAL HIGH (ref 70–99)
Potassium: 4 mmol/L (ref 3.5–5.1)
Sodium: 138 mmol/L (ref 135–145)
Total Bilirubin: 0.7 mg/dL (ref 0.0–1.2)
Total Protein: 6.3 g/dL — ABNORMAL LOW (ref 6.5–8.1)

## 2023-12-06 LAB — URINALYSIS, W/ REFLEX TO CULTURE (INFECTION SUSPECTED)
Bilirubin Urine: NEGATIVE
Glucose, UA: >=500 mg/dL — AB
Ketones, ur: NEGATIVE mg/dL
Nitrite: NEGATIVE
Protein, ur: NEGATIVE mg/dL
Specific Gravity, Urine: 1.01 (ref 1.005–1.030)
pH: 6 (ref 5.0–8.0)

## 2023-12-06 LAB — RESPIRATORY PANEL BY PCR

## 2023-12-06 LAB — PROTIME-INR
INR: 2.1 — ABNORMAL HIGH (ref 0.8–1.2)
Prothrombin Time: 24.5 s — ABNORMAL HIGH (ref 11.4–15.2)

## 2023-12-06 LAB — BRAIN NATRIURETIC PEPTIDE: B Natriuretic Peptide: 250.2 pg/mL — ABNORMAL HIGH (ref 0.0–100.0)

## 2023-12-06 LAB — TROPONIN I (HIGH SENSITIVITY)
Troponin I (High Sensitivity): 45 ng/L — ABNORMAL HIGH (ref <18)
Troponin I (High Sensitivity): 31 ng/L — ABNORMAL HIGH (ref <18)

## 2023-12-06 LAB — CBG MONITORING, ED: Glucose-Capillary: 111 mg/dL — ABNORMAL HIGH (ref 70–99)

## 2023-12-06 LAB — RESP PANEL BY RT-PCR (RSV, FLU A&B, COVID)  RVPGX2
Influenza A by PCR: NEGATIVE
Influenza B by PCR: NEGATIVE
Resp Syncytial Virus by PCR: NEGATIVE
SARS Coronavirus 2 by RT PCR: NEGATIVE

## 2023-12-06 LAB — I-STAT CG4 LACTIC ACID, ED: Lactic Acid, Venous: 0.9 mmol/L (ref 0.5–1.9)

## 2023-12-06 MED ORDER — EMPAGLIFLOZIN 10 MG PO TABS
10.0000 mg | ORAL_TABLET | Freq: Every day | ORAL | Status: DC
  Administered 2023-12-06 – 2023-12-08 (×3): 10 mg via ORAL
  Filled 2023-12-06 (×3): qty 1

## 2023-12-06 MED ORDER — WARFARIN - PHARMACIST DOSING INPATIENT: Freq: Every day | Status: DC

## 2023-12-06 MED ORDER — ENOXAPARIN SODIUM 40 MG/0.4ML IJ SOSY: 40.0000 mg | PREFILLED_SYRINGE | INTRAMUSCULAR | Status: DC

## 2023-12-06 MED ORDER — WARFARIN SODIUM 3 MG PO TABS
3.5000 mg | ORAL_TABLET | Freq: Once | ORAL | Status: AC
  Administered 2023-12-06: 3.5 mg via ORAL
  Filled 2023-12-06: qty 1

## 2023-12-06 MED ORDER — FLUTICASONE FUROATE-VILANTEROL 200-25 MCG/ACT IN AEPB
1.0000 | INHALATION_SPRAY | Freq: Every day | RESPIRATORY_TRACT | Status: DC
  Filled 2023-12-06: qty 28

## 2023-12-06 MED ORDER — ACETAMINOPHEN 650 MG RE SUPP: 650.0000 mg | Freq: Four times a day (QID) | RECTAL | Status: DC | PRN

## 2023-12-06 MED ORDER — SPIRONOLACTONE 25 MG PO TABS
25.0000 mg | ORAL_TABLET | Freq: Every day | ORAL | Status: DC
  Administered 2023-12-06 – 2023-12-08 (×3): 25 mg via ORAL
  Filled 2023-12-06 (×3): qty 1

## 2023-12-06 MED ORDER — FUROSEMIDE 10 MG/ML IJ SOLN
40.0000 mg | Freq: Once | INTRAMUSCULAR | Status: AC
  Administered 2023-12-06: 40 mg via INTRAVENOUS
  Filled 2023-12-06: qty 4

## 2023-12-06 MED ORDER — SODIUM CHLORIDE 0.9 % IV SOLN
500.0000 mg | INTRAVENOUS | Status: AC
  Administered 2023-12-07 – 2023-12-08 (×2): 500 mg via INTRAVENOUS
  Filled 2023-12-06 (×2): qty 5

## 2023-12-06 MED ORDER — TRAZODONE HCL 50 MG PO TABS
25.0000 mg | ORAL_TABLET | Freq: Every day | ORAL | Status: DC
  Administered 2023-12-06: 25 mg via ORAL
  Filled 2023-12-06: qty 1

## 2023-12-06 MED ORDER — SODIUM CHLORIDE 0.9 % IV SOLN
1.0000 g | INTRAVENOUS | Status: DC
  Administered 2023-12-07: 1 g via INTRAVENOUS
  Filled 2023-12-06: qty 10

## 2023-12-06 MED ORDER — PHENYTOIN SODIUM EXTENDED 100 MG PO CAPS
200.0000 mg | ORAL_CAPSULE | Freq: Two times a day (BID) | ORAL | Status: DC
  Administered 2023-12-07 – 2023-12-08 (×3): 200 mg via ORAL
  Filled 2023-12-06 (×4): qty 2

## 2023-12-06 MED ORDER — SODIUM CHLORIDE 0.9 % IV SOLN
1.0000 g | Freq: Once | INTRAVENOUS | Status: AC
  Administered 2023-12-06: 1 g via INTRAVENOUS
  Filled 2023-12-06: qty 10

## 2023-12-06 MED ORDER — ACETAMINOPHEN 325 MG PO TABS: 650.0000 mg | ORAL_TABLET | Freq: Four times a day (QID) | ORAL | Status: DC | PRN

## 2023-12-06 MED ORDER — PANTOPRAZOLE SODIUM 40 MG PO TBEC
40.0000 mg | DELAYED_RELEASE_TABLET | Freq: Every day | ORAL | Status: DC
  Administered 2023-12-06 – 2023-12-08 (×3): 40 mg via ORAL
  Filled 2023-12-06 (×3): qty 1

## 2023-12-06 MED ORDER — ALBUTEROL SULFATE (2.5 MG/3ML) 0.083% IN NEBU
2.5000 mg | INHALATION_SOLUTION | Freq: Four times a day (QID) | RESPIRATORY_TRACT | Status: DC
  Administered 2023-12-06 – 2023-12-07 (×5): 2.5 mg via RESPIRATORY_TRACT
  Filled 2023-12-06 (×5): qty 3

## 2023-12-06 MED ORDER — SENNOSIDES-DOCUSATE SODIUM 8.6-50 MG PO TABS: 1.0000 | ORAL_TABLET | Freq: Every evening | ORAL | Status: DC | PRN

## 2023-12-06 MED ORDER — SODIUM CHLORIDE 0.9% FLUSH
3.0000 mL | Freq: Two times a day (BID) | INTRAVENOUS | Status: DC
  Administered 2023-12-06 – 2023-12-07 (×3): 3 mL via INTRAVENOUS

## 2023-12-06 MED ORDER — PHENYTOIN 50 MG PO CHEW
50.0000 mg | CHEWABLE_TABLET | Freq: Every day | ORAL | Status: DC
  Administered 2023-12-06 – 2023-12-07 (×2): 50 mg via ORAL
  Filled 2023-12-06 (×3): qty 1

## 2023-12-06 MED ORDER — IPRATROPIUM-ALBUTEROL 0.5-2.5 (3) MG/3ML IN SOLN: 3.0000 mL | RESPIRATORY_TRACT | Status: DC | PRN

## 2023-12-06 MED ORDER — SODIUM CHLORIDE 0.9 % IV SOLN
500.0000 mg | Freq: Once | INTRAVENOUS | Status: AC
  Administered 2023-12-06: 500 mg via INTRAVENOUS
  Filled 2023-12-06: qty 5

## 2023-12-06 NOTE — Hospital Course (Addendum)
 O2 <80% on 2L supplemental O2 at around 6:30 AM while he was awake. Has been compliant with medications. No witnessed episodes of aspiration. Had had episodes of intermittent cough.   No nausea, vomiting, no fever,   INR 2.8 (goal   Weekend supervisor: Grady, RN   Medications   Baclofen  10 mg dailty  Trazadone 50 mg - LN Warfarin 3.5 mg daily - LN Dilantin  200 mg in the morning Dilantin  250mg  at bedtime Fluticasone  furoate-vilanterol 100-25 MCG/ACT Aepb  Torsemide  40 mg BID - LN - urinal + brief Refresh eye drop in each eye QID Kcloride Spironolactone  25 mg yesterday Poly Prednisone   Jardiance  10 mg yesterday Crestor  40 mg at bedtime  Zetia  10 mg  Pantoprazole  20 mg every other day  What type of diet Breo ellipta  Duoneb Baclofen 

## 2023-12-06 NOTE — ED Notes (Signed)
 Pt arrived to room 43, cyanotic and SPO2 70% with a Kountze.

## 2023-12-06 NOTE — Progress Notes (Signed)
 PHARMACY - ANTICOAGULATION CONSULT NOTE  Pharmacy Consult for warfarin Indication: atrial fibrillation  Allergies  Allergen Reactions   Fenofibrate Other (See Comments)    Per [facility]    Patient Measurements: Height: 6' 1 (185.4 cm) Weight: 136.1 kg (300 lb) IBW/kg (Calculated) : 79.9 HEPARIN  DW (KG): 110.7  Vital Signs: Temp: 99.2 F (37.3 C) (06/29 1136) Temp Source: Oral (06/29 1136) BP: 134/75 (06/29 1200) Pulse Rate: 100 (06/29 1200)  Labs: Recent Labs    12/06/23 0758 12/06/23 0803 12/06/23 0936  HGB 14.4 15.0  --   HCT 45.5 44.0  --   PLT 226  --   --   LABPROT 24.5*  --   --   INR 2.1*  --   --   CREATININE 0.58*  --   --   TROPONINIHS 45*  --  31*    Estimated Creatinine Clearance: 133.3 mL/min (A) (by C-G formula based on SCr of 0.58 mg/dL (L)).   Medical History: Past Medical History:  Diagnosis Date   Acute on chronic congestive heart failure (HCC)    Anemia, secondary    SECONDARY TO ACUTE BLOOD LOSS   Anxiety disorder    Bloody stool 08/29/2011   intermittent along with constipation.    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 IVC filter   Seizure disorder [facility])     Assessment: James Villegas presenting with SOB, hx of afib on warfarin PTA with last dose taken 6/28, INR therapeutic on admission at 2.1  PTA dosing:  3.5mg  daily  Goal of Therapy:  INR 2-3 Monitor platelets by anticoagulation protocol: Yes   Plan:  Warfarin 3.5mg  PO x 1 Daily INR, s/s bleeding  Dorn Poot, PharmD, [facility] Clinical Pharmacist ED Pharmacist Phone # [phone] 12/06/2023 12:41 PM

## 2023-12-06 NOTE — ED Notes (Signed)
 CCMD notified of room change

## 2023-12-06 NOTE — ED Notes (Signed)
 Ccmd called

## 2023-12-06 NOTE — ED Provider Notes (Signed)
 [place] [facility]ERGENCY DEPARTMENT AT [facility] Provider Note   CSN: [id number] Arrival date & time: 12/06/23  9266     Patient presents with: Shortness of Breath and Congestive Heart Failure   James Villegas is a 68 y.o. male.   HPI 68 year old male presents with shortness of breath and concern for CHF.  History is primarily from EMS due to the patient's prior stroke and limited speaking ability.  I did try to call his facility, [facility], but there was no answer.  Patient was reportedly short of breath this morning.  He was saturating in the 60s and put on a nonrebreather.  He does wear oxygen chronically, which he states is about 4 L.  He denies any chest pain but does feel short of breath currently.  EMS noted his blood pressure to be soft in the low 100s and gave him about 150 cc of IV fluids.  Patient is able to speak and answer some questions with one-word sentences but otherwise has a hard time speaking and the history is limited.  Prior to Admission medications   Medication Sig Start Date End Date Taking? Authorizing Provider  acetaminophen  (TYLENOL ) 325 MG tablet Take 650 mg by mouth every 6 (six) hours as needed for mild pain, headache or fever.    [provider]  antiseptic oral rinse (BIOTENE) LIQD 1 Application by Mouth Rinse route at bedtime.    [provider]  Baclofen  5 MG TABS Take 5 mg by mouth at bedtime. 02/19/23   [provider]  bisacodyl  (DULCOLAX) 5 MG EC tablet Take 10 mg by mouth daily as needed for moderate constipation.    [provider]  Carboxymeth-Glycerin -Polysorb (REFRESH OPTIVE ADVANCED) 0.5-1-0.5 % SOLN Place 1 drop into both eyes in the morning, at noon, in the evening, and at bedtime. Wait 3-5 minutes between eye medications    [provider]  cycloSPORINE  (RESTASIS ) 0.05 % ophthalmic emulsion Place 1 drop into both eyes 2 (two) times daily.    [provider]  DULoxetine  (CYMBALTA ) 30  MG capsule Take 30 mg by mouth in the morning. 08/31/23   [provider]  empagliflozin  (JARDIANCE ) 10 MG TABS tablet Take by mouth daily.    [provider]  ezetimibe  (ZETIA ) 10 MG tablet Take 10 mg by mouth daily.    [provider]  fluticasone  furoate-vilanterol (BREO ELLIPTA ) 100-25 MCG/ACT AEPB Inhale 1 puff into the lungs daily. 10/23/23   Elnora Ip, MD  guaiFENesin (ROBITUSSIN) 100 MG/5ML liquid Take 15 mLs by mouth every 4 (four) hours as needed for cough.    [provider]  ipratropium-albuterol  (DUONEB) 0.5-2.5 (3) MG/3ML SOLN Take 3 mLs by nebulization every 4 (four) hours as needed. 10/23/23   Elnora Ip, MD  losartan (COZAAR) 25 MG tablet Take 12.5 mg by mouth daily.    [provider]  mupirocin  ointment (BACTROBAN ) 2 % Place 1 Application into the nose 2 (two) times daily. 10/23/23   Elnora Ip, MD  Nystatin (GERHARDT'S BUTT CREAM) CREA Apply 1 Application topically as needed for irritation. 10/23/23   Elnora Ip, MD  omeprazole  (PRILOSEC) 20 MG capsule Take 20 mg by mouth every other day. 08/01/21   [provider]  OXYGEN Inhale 4 L into the lungs continuous.    [provider]  phenytoin  (DILANTIN  INFATABS) 50 MG tablet Chew 50 mg by mouth at bedtime. Give with 200mg  capsules for total dose of 250mg  qhs    [provider]  phenytoin  (DILANTIN ) 100 MG ER capsule Take 200 mg by mouth 2 (two) times daily.    [provider]  polyvinyl alcohol  (LIQUIFILM TEARS) 1.4 % ophthalmic solution Place 1 drop into both eyes 4 (four) times daily. 10/23/23   Elnora Ip, MD  potassium chloride  SA (KLOR-CON ) 20 MEQ tablet Take 2 tablets (40 mEq total) by mouth daily. 08/08/20   Cheryle Page, MD  rosuvastatin  (CRESTOR ) 40 MG tablet Take 40 mg by mouth at bedtime.    [provider]  spironolactone  (ALDACTONE ) 25 MG tablet Take 1 tablet (25 mg total) by  mouth daily. Patient taking differently: Take 25 mg by mouth in the morning. 09/22/21 10/20/23  Willette Adriana LABOR, MD  torsemide  (DEMADEX ) 20 MG tablet Take 40 mg by mouth in the morning and at bedtime. 08/31/23   [provider]  traZODone  (DESYREL ) 50 MG tablet Take 25 mg by mouth at bedtime. 08/01/21   [provider]  warfarin (COUMADIN ) 1 MG tablet Take 3.5 mg by mouth daily.    [provider]    Allergies: Fenofibrate    Review of Systems  Respiratory:  Positive for shortness of breath.     Updated Vital Signs BP 119/61   Pulse (!) 103   Temp 98 F (36.7 C)   Resp (!) 26   Ht 6' 1 (1.854 m)   Wt 136.1 kg   SpO2 93%   BMI 39.58 kg/m   Physical Exam Vitals and nursing note reviewed.  Constitutional:      Appearance: He is well-developed. He is obese.  HENT:     Head: Normocephalic and atraumatic.   Cardiovascular:     Rate and Rhythm: Normal rate and regular rhythm.     Heart sounds: Normal heart sounds.  Pulmonary:     Effort: Pulmonary effort is normal. Tachypnea present. No respiratory distress.     Breath sounds: Examination of the left-upper field reveals decreased breath sounds. Examination of the left-middle field reveals decreased breath sounds. Examination of the left-lower field reveals decreased breath sounds. Decreased breath sounds present.  Abdominal:     Palpations: Abdomen is soft.     Tenderness: There is no abdominal tenderness.   Musculoskeletal:     Comments: Right lower extremity swollen compared to left.   Skin:    General: Skin is warm and dry.   Neurological:     Mental Status: He is alert.     Comments: Chronic right sided deficits    (all labs ordered are listed, but only abnormal results are displayed) Labs Reviewed  COMPREHENSIVE METABOLIC PANEL WITH GFR - Abnormal; Notable for the following components:      Result Value   Chloride 93 (*)    Glucose, Bld 132 (*)    BUN 7 (*)    Creatinine, Ser 0.58  (*)    Calcium  8.4 (*)    Total Protein 6.3 (*)    Albumin 2.9 (*)    All other components within normal limits  CBC WITH DIFFERENTIAL/PLATELET - Abnormal; Notable for the following components:   WBC 10.9 (*)    Neutro Abs 9.0 (*)    Lymphs Abs 0.5 (*)    Monocytes Absolute 1.1 (*)    Abs Immature Granulocytes 0.17 (*)    All other components within normal limits  BRAIN NATRIURETIC PEPTIDE - Abnormal; Notable for the following components:   B Natriuretic Peptide 250.2 (*)    All other components within normal limits  PROTIME-INR - Abnormal; Notable for the following components:   Prothrombin Time 24.5 (*)    INR 2.1 (*)    All other components within normal limits  I-STAT VENOUS BLOOD GAS, ED - Abnormal; Notable for the following components:   pCO2, Ven 63.9 (*)    pO2, Ven 137 (*)    Bicarbonate 40.4 (*)    TCO2 42 (*)    Acid-Base Excess 13.0 (*)    Calcium , Ion 1.04 (*)    All other components within normal limits  TROPONIN I (HIGH SENSITIVITY) - Abnormal; Notable for the following components:   Troponin I (High Sensitivity) 45 (*)    All other components within normal limits  RESP PANEL BY RT-PCR (RSV, FLU A&B, COVID)  RVPGX2  CULTURE, BLOOD (ROUTINE X 2)  CULTURE, BLOOD (ROUTINE X 2)  URINALYSIS, W/ REFLEX TO CULTURE (INFECTION SUSPECTED)  I-STAT CG4 LACTIC ACID, ED  TROPONIN I (HIGH SENSITIVITY)    EKG: EKG Interpretation Date/Time:  Sunday December 06 2023 07:45:02 EDT Ventricular Rate:  87 PR Interval:    QRS Duration:  140 QT Interval:  392 QTC Calculation: 472 R Axis:   97  Text Interpretation: Atrial fibrillation RBBB and LPFB Repol abnrm suggests ischemia, diffuse leads Baseline wander in lead(s) V2 overall similar to May 2025 Confirmed by Freddi Hamilton [phone]) on 12/06/2023 7:46:48 AM  Radiology: DG Chest Portable 1 View Result Date: 12/06/2023 CLINICAL DATA:  Dyspnea. EXAM: PORTABLE CHEST 1 VIEW COMPARISON:  10/22/2023 FINDINGS: Stable cardiac  enlargement. Asymmetric elevation of the right hemidiaphragm is identified with pleuroparenchymal scarring within the right base with chronic blunting of the right costophrenic angle. Atelectasis noted in the left base. Increase interstitial prominence with hazy lung opacities noted in the left lung. No consolidative change or pneumothorax. Visualized osseous structures are unremarkable. IMPRESSION: 1. Cardiac enlargement with increase interstitial prominence and hazy lung opacities in the left lung. Findings may reflect asymmetric edema or atypical infection. 2. Chronic asymmetric elevation of the right hemidiaphragm with pleuroparenchymal scarring in the right base. Electronically Signed   By: Waddell Calk M.D.   On: 12/06/2023 08:16     Procedures   Medications Ordered in the ED  azithromycin  (ZITHROMAX ) 500 mg in sodium chloride  0.9 % 250 mL IVPB (500 mg Intravenous New Bag/Given 12/06/23 0917)  cefTRIAXone  (ROCEPHIN ) 1 g in sodium chloride  0.9 % 100 mL IVPB (0 g Intravenous Stopped 12/06/23 0931)  furosemide  (LASIX ) injection 40 mg (40 mg Intravenous Given 12/06/23 0931)                                    Medical Decision Making Amount and/or Complexity of Data Reviewed Labs: ordered.    Details: Mild leukocytosis.  Chronic respiratory acidosis but no acute acidosis.  Mild elevated BNP.  Troponin similar to prior Radiology: ordered and independent interpretation performed.    Details: Left-sided infiltrate, CHF versus pneumonia. ECG/medicine tests: ordered and independent interpretation performed.    Details: A-fib  Risk Prescription drug management. Decision regarding hospitalization.   Patient presents with shortness of breath and increased O2 requirement.  Patient has some tachypnea but not respiratory distress.  VBG is reassuring.  I do not think an ABG is warranted as he is easily awakened and talks to me.  Somewhat limited history due to his prior stroke.  Otherwise, seems to  have CHF versus pneumonia, given the asymmetric appearance, will treat for both  as he does appear to have some peripheral edema and elevated BNP.  He also has a mild leukocytosis.  Discussed with the internal medicine teaching service who will admit.  At this point I do not think he needs BiPAP or intubation but is requiring 6 L instead of his normal 4.     Final diagnoses:  Acute on chronic respiratory failure with hypoxia [facility])    ED Discharge Orders     None          Freddi Hamilton, MD 12/06/23 1007

## 2023-12-06 NOTE — ED Triage Notes (Signed)
 Pt bib [facility] from [facility] facility. Pt was at 60% wearing his baseline oxygen 4L. When ems arrived, they placed patient on NRB and he has been at 98% on NRB. Pt was pale and lethargic.  Pt has hx of chf and afib.   Ems vs 116/50 106 hr  98% NRB  121 CBG  150 NS given 18glac

## 2023-12-06 NOTE — ED Notes (Signed)
 Pt now on 6L SPO2 96%, will decrease to 4L.

## 2023-12-06 NOTE — H&P (Addendum)
 Date: 12/06/2023         Patient Name:  James Villegas MRN: [id number]  DOB: 1955/08/18 Age / Sex: 68 y.o., male   PCP: Feliciano Devoria LABOR, MD         Medical Service: Internal Medicine Teaching Service         Attending Physician: Dr. Eben Reyes BROCKS, MD     First Contact: Dr. Missy Sandhoff, MD Pager [phone]     Second Contact: Dr. Hadassah Kristy Ahr, MD          After Hours (After 5p/  First Contact Pager: [phone]  weekends / holidays): Second Contact Pager: [phone]   Chief Concern: Shortness of breath  History of Present Illness: 68 yo M with hx of R sided CVA with hemiplegia, afib/flutter, HFpEF, chronic respirator failure with hypercarbia, Protein C/S deficiency, lupus anticoagulant who presented from ALF with shortness of breath.  The patient's history was elicited from chart review and EMS report, as patient is a poor historian. I called the [facility] facility to get collateral information.    Notably, the patient was recently admitted on 5/13 for acute on chronic HFpEF.  She resides in [facility] facility, and per EMS report, she noted today to be hypoxic while on 4L oxygen, Satting in the 60%. When EMS was arrived, the patient was placed on an NRB mask, with his oxygen saturation improving to 90%. He was also noted to be pale and lethargic.   The patient endorses a mild dry cough but denies fever or sick contacts. At baseline, his right lower extremity edema is greater than the left, but he denies any worsening swelling.  Allergies: Allergies  Allergen Reactions   Fenofibrate Other (See Comments)    Per MAR   Past Medical History: Past Medical History:  Diagnosis Date   Acute on chronic congestive heart failure (HCC)    Anemia, secondary    SECONDARY TO ACUTE BLOOD LOSS   Anxiety disorder    Bloody stool 08/29/2011   intermittent along with constipation.    CVA (cerebral vascular accident) (HCC) 2010   large left MCA stroke with right hemiparesis    Depression    DVT of lower extremity (deep venous thrombosis) (HCC)    RIGHT LOWER; s/p IVC filter 7/12   Dysphagia    Hyperlipidemia    Hypertension    Lupus anticoagulant disorder (HCC) 1990   Morbid obesity (HCC)    PFO (patent foramen ovale)    TEE 2/10: EF 60%, trivial AI, mild Ao root dilatation, mod PFO with R-L shunting, atrial septal aneurysm;   echo 7/12: EF 65-70%, grade 1 diast dysfxn, mild MR, LVOT showed severe obstruction   Protein C deficiency (HCC)    Protein S deficiency (HCC)    Rectus sheath hematoma 7/12   required reversal of anticoagulation and c/b DVT req. IVC filter   Seizure disorder (HCC)     Medications: Current Meds  Medication Sig   acetaminophen  (TYLENOL ) 325 MG tablet Take 650 mg by mouth every 6 (six) hours as needed for mild pain, headache or fever.   antiseptic oral rinse (BIOTENE) LIQD 1 Application by Mouth Rinse route at bedtime.   Baclofen  5 MG TABS Take 5 mg by mouth at bedtime.   bisacodyl  (DULCOLAX) 5 MG EC tablet Take 10 mg by mouth daily as needed for moderate constipation.   Carboxymeth-Glycerin -Polysorb (REFRESH OPTIVE ADVANCED) 0.5-1-0.5 % SOLN Place 1 drop into both eyes in the morning, at noon,  in the evening, and at bedtime. Wait 3-5 minutes between eye medications   cycloSPORINE  (RESTASIS ) 0.05 % ophthalmic emulsion Place 1 drop into both eyes 2 (two) times daily.   DULoxetine  (CYMBALTA ) 30 MG capsule Take 30 mg by mouth in the morning.   empagliflozin  (JARDIANCE ) 10 MG TABS tablet Take by mouth daily.   ezetimibe  (ZETIA ) 10 MG tablet Take 10 mg by mouth daily.   fluticasone -salmeterol (ADVAIR) 250-50 MCG/ACT AEPB Inhale 1 puff into the lungs in the morning and at bedtime.   guaiFENesin (ROBITUSSIN) 100 MG/5ML liquid Take 15 mLs by mouth every 4 (four) hours as needed for cough.   ipratropium-albuterol  (DUONEB) 0.5-2.5 (3) MG/3ML SOLN Take 3 mLs by nebulization every 4 (four) hours as needed.   omeprazole  (PRILOSEC) 20 MG capsule  Take 20 mg by mouth every other day.   phenytoin  (DILANTIN  INFATABS) 50 MG tablet Chew 50 mg by mouth at bedtime. Give with 200mg  capsules for total dose of 250mg  qhs   phenytoin  (DILANTIN ) 100 MG ER capsule Take 200 mg by mouth 2 (two) times daily.   polyvinyl alcohol  (LIQUIFILM TEARS) 1.4 % ophthalmic solution Place 1 drop into both eyes 4 (four) times daily.   potassium chloride  SA (KLOR-CON ) 20 MEQ tablet Take 2 tablets (40 mEq total) by mouth daily.   rosuvastatin  (CRESTOR ) 40 MG tablet Take 40 mg by mouth at bedtime.   spironolactone  (ALDACTONE ) 25 MG tablet Take 1 tablet (25 mg total) by mouth daily.   torsemide  (DEMADEX ) 20 MG tablet Take 40 mg by mouth in the morning and at bedtime.   traZODone  (DESYREL ) 50 MG tablet Take 25 mg by mouth at bedtime.   warfarin (COUMADIN ) 1 MG tablet Take 3.5 mg by mouth daily.    Surgical History: Past Surgical History:  Procedure Laterality Date   dental extraction     multiple   vena cavogram  12/2010   INFERIOR    Family History:  Family History  Problem Relation Age of Onset   Sleep apnea Sister     Social History:  Lives at [facility] nursing [facility]. Sister is in contact with facility and patient Dependent in ADLs/iADLs PCP: Feliciano Devoria LABOR, MD  Physical Exam:  Blood pressure 106/69, pulse (!) 110, temperature 98 F (36.7 C), resp. rate (!) 24, height 6' 1 (1.854 m), weight 136.1 kg, SpO2 95%.   Chronically ill-appearing,morbidly obese, lying in bed on 6Lnc Irregular heart rhythm, no murmurs.  No JVD elevation. Unable to auscultate lung fields due to body habitus. Obese abdomen, nontender normal bowel sounds. Awake and alert, right-sided weakness from prior stroke.  No new focal deficits. Skin warm and dry. Mood and affect appropriate.  Labs:     Latest Ref Rng & Units 12/06/2023    8:03 AM 12/06/2023    7:58 AM 10/23/2023    9:50 AM  CBC  WBC 4.0 - 10.5 K/uL  10.9  8.4   Hemoglobin 13.0 - 17.0 g/dL 84.9  85.5  85.8    Hematocrit 39.0 - 52.0 % 44.0  45.5  42.6   Platelets 150 - 400 K/uL  226  249         Latest Ref Rng & Units 12/06/2023    8:03 AM 12/06/2023    7:58 AM 10/23/2023   11:37 AM  CMP  Glucose 70 - 99 mg/dL  867  880   BUN 8 - 23 mg/dL  7  12   Creatinine 9.38 - 1.24 mg/dL  9.41  0.58   Sodium 135 - 145 mmol/L 136  138  140   Potassium 3.5 - 5.1 mmol/L 4.0  4.0  3.4   Chloride 98 - 111 mmol/L  93  97   CO2 22 - 32 mmol/L  32  30   Calcium  8.9 - 10.3 mg/dL  8.4  8.7   Total Protein 6.5 - 8.1 g/dL  6.3    Total Bilirubin 0.0 - 1.2 mg/dL  0.7    Alkaline Phos 38 - 126 U/L  105    AST 15 - 41 U/L  17    ALT 0 - 44 U/L  13       Images and other studies: DG Chest Portable  IMPRESSION: 1. Cardiac enlargement with increase interstitial prominence and hazy lung opacities in the left lung. Findings may reflect asymmetric edema or atypical infection. 2. Chronic asymmetric elevation of the right hemidiaphragm with pleuroparenchymal scarring in the right base.    EKG:  Atrial fibrillation with RBBB, overall similar compared to EKG in May 2025.  Last TTE LVEF 65 to 70%, with moderate left ventricular hypertrophy.  Assessment & Plan:  KAULANA BRINDLE is a 66 y.o. 68 yo M with hx of R sided CVA with hemiplegia, afib/flutter, HFpEF, chronic respirator failure with hypercarbia, Protein C/S deficiency, lupus anticoagulant who presented from ALF with shortness of breath, and admitted for acute on chronic diastolic heart failure.  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 # Presumed lung infection. Presenting with shortness of breath, and increased oxygen requirement; (chronically on 4 L supplemental oxygen), now on 6L [place],  satting normal.  BNP elevated to 250.  Troponin mildly elevated, has flattened., EKG not concerning for ACS. Last echocardiogram 10/2023 LVEF 65 to 70%, with moderate left ventricular hypertrophy.  At home, he is on torsemide  20 mg. At the ED, he received 1 dose of IV Lasix  40mg , will order an additional dose of lasix  40 mg.  Etiology of patient's acute on chronic HFpEF unclear; medication nonadherence versus presumed atypical infection on chest x-ray.  Will treat with IV diuresis, and treat atypical infection with ceftriaxone /azithromycin .  - Cardiac monitoring - Wean off oxygen as tolerated. - IV diuresis lasix ( total dose 80 mg ) - Strict I's and O's, keep K >4, Mg > 2. - Daily BMP - Daily weights. - Will get Procalcitonin - If respiratory status worsen, consider putting patient on BiPAP, due to history of chronic hypercarbia. - Will continue Jardiance  10 mg, and spironolactone  25 mg.  # Mild RLE cellulitis  Mild erythema on RLE, tender to palpation.  At baseline, RLE greater than LLE. No calf tenderness on exam. - VAS US  lower extremity. - Will treat with ceftriaxone , as above however CTX does not have great staph coverage.  # Atrial fibrillation/atrial flutter. EKG showed atrial fibrillation with right bundle branch block, unchanged as compared to the prior EKGs in May 2025.  At home, she is on warfarin, INR in the ED 2.1. -CW warfarin, dosing per pharmacy.  #Hypertension Normotensive, at home is on losartan.   - Holding antihypertensive due to acute CHF exacerbation.  #Epilepsy Stable,  At home he is on phenytoin  200 mg BID,and additional 50 mg at bedtime. - Will check phenytoin  level. -  CW treatment as above.  Chronic medical conditions: Depression:  trazodone  50 mg at bedtime. GERD: Continue home omeprazole  20 mg daily.   CODE STATUS: Per paperwork from [facility] facility, the patient is listed as DNR/DNI; however, he has elected to be full code. He understands that  this means in the event of an emergency, CPR and intubation will be performed, and he acknowledges the potential for poor outcomes given his prior medical comorbidities.  Diet: Heart Healthy VTE: warfarin IVF: None,None Code: Full  Signed: Missy Sandhoff, MD 12/06/2023, 2:05 PM

## 2023-12-06 NOTE — ED Notes (Signed)
 Pt placed on NRB to increase SPO2 levels

## 2023-12-06 NOTE — Progress Notes (Incomplete)
 Internal Medicine Teaching Service Attending Note Date: 12/06/2023  Patient name: James Villegas  Medical record number: [id number]  Date of birth: [birth year]   I have seen and evaluated James Villegas and discussed their care with the Residency Team.   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 This am he developed diaphoresis.  He denies CP but has noticed worsening LE edema during this period.  In ED his CXR showed worsening edema.   Sister is POA. He would like to rescind his DNR at this time.    Physical Exam: Blood pressure 106/69, pulse (!) 110, temperature 98 F (36.7 C), resp. rate (!) 24, height 6' 1 (1.854 m), weight 136.1 kg, SpO2 95%. General appearance: alert, cooperative, and morbidly obese Eyes: negative findings: pupils equal, round, reactive to light and accomodation Throat: normal findings: no thrush and abnormal findings: dry Neck: no adenopathy and supple, symmetrical, trachea midline Resp: rhonchi anterior - bilateral Cardio: irregularly irregular rhythm GI: normal findings: bowel sounds normal and soft, non-tender Extremities: edema 3+. He has slight erythema on RLE. Tender to touch. No increase in warmth.  Neurologic: Motor: none on Right.   Lab results: Results for orders placed or performed during the hospital encounter of 12/06/23 (from the past 24 hours)  Resp panel by RT-PCR (RSV, Flu A&B, Covid) Anterior Nasal Swab     Status: None   Collection Time: 12/06/23  7:43 AM   Specimen: Anterior Nasal Swab  Result Value Ref Range   SARS Coronavirus 2 by RT PCR NEGATIVE NEGATIVE   Influenza A by PCR NEGATIVE NEGATIVE   Influenza B by PCR NEGATIVE NEGATIVE   Resp Syncytial Virus by PCR NEGATIVE NEGATIVE  Comprehensive metabolic panel     Status: Abnormal   Collection Time: 12/06/23  7:58 AM  Result Value Ref Range   Sodium 138 135 - 145 mmol/L   Potassium 4.0 3.5 - 5.1 mmol/L   Chloride 93 (L) 98 -  111 mmol/L   CO2 32 22 - 32 mmol/L   Glucose, Bld 132 (H) 70 - 99 mg/dL   BUN 7 (L) 8 - 23 mg/dL   Creatinine, Ser 9.41 (L) 0.61 - 1.24 mg/dL   Calcium  8.4 (L) 8.9 - 10.3 mg/dL   Total Protein 6.3 (L) 6.5 - 8.1 g/dL   Albumin 2.9 (L) 3.5 - 5.0 g/dL   AST 17 15 - 41 U/L   ALT 13 0 - 44 U/L   Alkaline Phosphatase 105 38 - 126 U/L   Total Bilirubin 0.7 0.0 - 1.2 mg/dL   GFR, Estimated >39 >39 mL/min   Anion gap 13 5 - 15  Troponin I (High Sensitivity)     Status: Abnormal   Collection Time: 12/06/23  7:58 AM  Result Value Ref Range   Troponin I (High Sensitivity) 45 (H) <18 ng/L  CBC with Differential     Status: Abnormal   Collection Time: 12/06/23  7:58 AM  Result Value Ref Range   WBC 10.9 (H) 4.0 - 10.5 K/uL   RBC 4.65 4.22 - 5.81 MIL/uL   Hemoglobin 14.4 13.0 - 17.0 g/dL   HCT 54.4 60.9 - 47.9 %   MCV 97.8 80.0 - 100.0 fL   MCH 31.0 26.0 - 34.0 pg   MCHC 31.6 30.0 - 36.0 g/dL   RDW 85.6 88.4 - 84.4 %   Platelets 226 150 - 400 K/uL   nRBC 0.0  0.0 - 0.2 %   Neutrophils Relative % 81 %   Neutro Abs 9.0 (H) 1.7 - 7.7 K/uL   Lymphocytes Relative 5 %   Lymphs Abs 0.5 (L) 0.7 - 4.0 K/uL   Monocytes Relative 11 %   Monocytes Absolute 1.1 (H) 0.1 - 1.0 K/uL   Eosinophils Relative 0 %   Eosinophils Absolute 0.0 0.0 - 0.5 K/uL   Basophils Relative 1 %   Basophils Absolute 0.1 0.0 - 0.1 K/uL   Immature Granulocytes 2 %   Abs Immature Granulocytes 0.17 (H) 0.00 - 0.07 K/uL  Brain natriuretic peptide     Status: Abnormal   Collection Time: 12/06/23  7:58 AM  Result Value Ref Range   B Natriuretic Peptide 250.2 (H) 0.0 - 100.0 pg/mL  Protime-INR     Status: Abnormal   Collection Time: 12/06/23  7:58 AM  Result Value Ref Range   Prothrombin Time 24.5 (H) 11.4 - 15.2 seconds   INR 2.1 (H) 0.8 - 1.2  I-Stat venous blood gas, ED     Status: Abnormal   Collection Time: 12/06/23  8:03 AM  Result Value Ref Range   pH, Ven 7.409 7.25 - 7.43   pCO2, Ven 63.9 (H) 44 - 60 mmHg   pO2,  Ven 137 (H) 32 - 45 mmHg   Bicarbonate 40.4 (H) 20.0 - 28.0 mmol/L   TCO2 42 (H) 22 - 32 mmol/L   O2 Saturation 99 %   Acid-Base Excess 13.0 (H) 0.0 - 2.0 mmol/L   Sodium 136 135 - 145 mmol/L   Potassium 4.0 3.5 - 5.1 mmol/L   Calcium , Ion 1.04 (L) 1.15 - 1.40 mmol/L   HCT 44.0 39.0 - 52.0 %   Hemoglobin 15.0 13.0 - 17.0 g/dL   Sample type VENOUS   I-Stat Lactic Acid, ED     Status: None   Collection Time: 12/06/23  8:04 AM  Result Value Ref Range   Lactic Acid, Venous 0.9 0.5 - 1.9 mmol/L  Troponin I (High Sensitivity)     Status: Abnormal   Collection Time: 12/06/23  9:36 AM  Result Value Ref Range   Troponin I (High Sensitivity) 31 (H) <18 ng/L    Imaging results:  DG Chest Portable 1 View Result Date: 12/06/2023 CLINICAL DATA:  Dyspnea. EXAM: PORTABLE CHEST 1 VIEW COMPARISON:  10/22/2023 FINDINGS: Stable cardiac enlargement. Asymmetric elevation of the right hemidiaphragm is identified with pleuroparenchymal scarring within the right base with chronic blunting of the right costophrenic angle. Atelectasis noted in the left base. Increase interstitial prominence with hazy lung opacities noted in the left lung. No consolidative change or pneumothorax. Visualized osseous structures are unremarkable. IMPRESSION: 1. Cardiac enlargement with increase interstitial prominence and hazy lung opacities in the left lung. Findings may reflect asymmetric edema or atypical infection. 2. Chronic asymmetric elevation of the right hemidiaphragm with pleuroparenchymal scarring in the right base. Electronically Signed   By: Waddell Calk M.D.   On: 12/06/2023 08:16    Assessment and Plan: I agree with the formulated Assessment and Plan with the following changes:  CHF (BNP 250) Will diurese as he tolerates.  Watch his I/O Watch on tele.   R sided hemiplegia Previous CVA  RLE erythema, tenderness Will check u/s to eval for DVT (he is on eliquis at home).   He has gotten azithro, ceftriaxone  for  concern for CAP.    Eben Reyes BROCKS, MD

## 2023-12-06 NOTE — ED Notes (Signed)
 This paramedic spoke with patient sister Devere and updated her on patient.

## 2023-12-07 ENCOUNTER — Observation Stay (HOSPITAL_COMMUNITY)

## 2023-12-07 DIAGNOSIS — J9622 Acute and chronic respiratory failure with hypercapnia: Secondary | ICD-10-CM | POA: Diagnosis present

## 2023-12-07 DIAGNOSIS — Z79899 Other long term (current) drug therapy: Secondary | ICD-10-CM | POA: Diagnosis not present

## 2023-12-07 DIAGNOSIS — Z95828 Presence of other vascular implants and grafts: Secondary | ICD-10-CM | POA: Diagnosis not present

## 2023-12-07 DIAGNOSIS — E441 Mild protein-calorie malnutrition: Secondary | ICD-10-CM

## 2023-12-07 DIAGNOSIS — Z87891 Personal history of nicotine dependence: Secondary | ICD-10-CM | POA: Diagnosis not present

## 2023-12-07 DIAGNOSIS — I5033 Acute on chronic diastolic (congestive) heart failure: Secondary | ICD-10-CM | POA: Diagnosis present

## 2023-12-07 DIAGNOSIS — I69951 Hemiplegia and hemiparesis following unspecified cerebrovascular disease affecting right dominant side: Secondary | ICD-10-CM

## 2023-12-07 DIAGNOSIS — E785 Hyperlipidemia, unspecified: Secondary | ICD-10-CM | POA: Diagnosis present

## 2023-12-07 DIAGNOSIS — D6862 Lupus anticoagulant syndrome: Secondary | ICD-10-CM | POA: Diagnosis present

## 2023-12-07 DIAGNOSIS — Z9981 Dependence on supplemental oxygen: Secondary | ICD-10-CM | POA: Diagnosis not present

## 2023-12-07 DIAGNOSIS — I80291 Phlebitis and thrombophlebitis of other deep vessels of right lower extremity: Secondary | ICD-10-CM

## 2023-12-07 DIAGNOSIS — I509 Heart failure, unspecified: Secondary | ICD-10-CM | POA: Diagnosis present

## 2023-12-07 DIAGNOSIS — G4733 Obstructive sleep apnea (adult) (pediatric): Secondary | ICD-10-CM

## 2023-12-07 DIAGNOSIS — G4700 Insomnia, unspecified: Secondary | ICD-10-CM | POA: Diagnosis present

## 2023-12-07 DIAGNOSIS — Z7984 Long term (current) use of oral hypoglycemic drugs: Secondary | ICD-10-CM | POA: Diagnosis not present

## 2023-12-07 DIAGNOSIS — I4891 Unspecified atrial fibrillation: Secondary | ICD-10-CM

## 2023-12-07 DIAGNOSIS — I110 Hypertensive heart disease with heart failure: Secondary | ICD-10-CM | POA: Diagnosis present

## 2023-12-07 DIAGNOSIS — F32A Depression, unspecified: Secondary | ICD-10-CM | POA: Diagnosis present

## 2023-12-07 DIAGNOSIS — I4892 Unspecified atrial flutter: Secondary | ICD-10-CM | POA: Diagnosis present

## 2023-12-07 DIAGNOSIS — G9341 Metabolic encephalopathy: Secondary | ICD-10-CM | POA: Diagnosis not present

## 2023-12-07 DIAGNOSIS — Z7951 Long term (current) use of inhaled steroids: Secondary | ICD-10-CM | POA: Diagnosis not present

## 2023-12-07 DIAGNOSIS — I69351 Hemiplegia and hemiparesis following cerebral infarction affecting right dominant side: Secondary | ICD-10-CM | POA: Diagnosis not present

## 2023-12-07 DIAGNOSIS — Z6839 Body mass index (BMI) 39.0-39.9, adult: Secondary | ICD-10-CM | POA: Diagnosis not present

## 2023-12-07 DIAGNOSIS — G40909 Epilepsy, unspecified, not intractable, without status epilepticus: Secondary | ICD-10-CM | POA: Diagnosis present

## 2023-12-07 DIAGNOSIS — N390 Urinary tract infection, site not specified: Secondary | ICD-10-CM | POA: Diagnosis not present

## 2023-12-07 DIAGNOSIS — Z7901 Long term (current) use of anticoagulants: Secondary | ICD-10-CM | POA: Diagnosis not present

## 2023-12-07 DIAGNOSIS — J9621 Acute and chronic respiratory failure with hypoxia: Secondary | ICD-10-CM

## 2023-12-07 DIAGNOSIS — K219 Gastro-esophageal reflux disease without esophagitis: Secondary | ICD-10-CM | POA: Diagnosis present

## 2023-12-07 DIAGNOSIS — Q2112 Patent foramen ovale: Secondary | ICD-10-CM | POA: Diagnosis not present

## 2023-12-07 DIAGNOSIS — E662 Morbid (severe) obesity with alveolar hypoventilation: Secondary | ICD-10-CM | POA: Diagnosis present

## 2023-12-07 LAB — BLOOD GAS, VENOUS
Acid-Base Excess: 10.7 mmol/L — ABNORMAL HIGH (ref 0.0–2.0)
Acid-Base Excess: 17.5 mmol/L — ABNORMAL HIGH (ref 0.0–2.0)
Acid-Base Excess: 16.7 mmol/L — ABNORMAL HIGH (ref 0.0–2.0)
Bicarbonate: 43.4 mmol/L — ABNORMAL HIGH (ref 20.0–28.0)
Bicarbonate: 48.8 mmol/L — ABNORMAL HIGH (ref 20.0–28.0)
Bicarbonate: 46.2 mmol/L — ABNORMAL HIGH (ref 20.0–28.0)
O2 Saturation: 67.8 %
O2 Saturation: 70.2 %
O2 Saturation: 92.7 %
Patient temperature: 36.9
Patient temperature: 37.6
Patient temperature: 36.7
pCO2, Ven: 110 mmHg (ref 44–60)
pCO2, Ven: 100 mmHg (ref 44–60)
pCO2, Ven: 79 mmHg (ref 44–60)
pH, Ven: 7.2 — ABNORMAL LOW (ref 7.25–7.43)
pH, Ven: 7.3 (ref 7.25–7.43)
pH, Ven: 7.37 (ref 7.25–7.43)
pO2, Ven: 41 mmHg (ref 32–45)
pO2, Ven: 40 mmHg (ref 32–45)
pO2, Ven: 58 mmHg — ABNORMAL HIGH (ref 32–45)

## 2023-12-07 LAB — COMPREHENSIVE METABOLIC PANEL WITH GFR
ALT: 13 U/L (ref 0–44)
AST: 14 U/L — ABNORMAL LOW (ref 15–41)
Albumin: 2.9 g/dL — ABNORMAL LOW (ref 3.5–5.0)
Alkaline Phosphatase: 106 U/L (ref 38–126)
Anion gap: 10 (ref 5–15)
BUN: 8 mg/dL (ref 8–23)
CO2: 36 mmol/L — ABNORMAL HIGH (ref 22–32)
Calcium: 8.9 mg/dL (ref 8.9–10.3)
Chloride: 93 mmol/L — ABNORMAL LOW (ref 98–111)
Creatinine, Ser: 0.64 mg/dL (ref 0.61–1.24)
GFR, Estimated: >60 mL/min (ref >60)
Glucose, Bld: 120 mg/dL — ABNORMAL HIGH (ref 70–99)
Potassium: 3.8 mmol/L (ref 3.5–5.1)
Sodium: 139 mmol/L (ref 135–145)
Total Bilirubin: 0.5 mg/dL (ref 0.0–1.2)
Total Protein: 6.6 g/dL (ref 6.5–8.1)

## 2023-12-07 LAB — MRSA NEXT GEN BY PCR, NASAL: MRSA by PCR Next Gen: DETECTED — AB

## 2023-12-07 LAB — PROTIME-INR
INR: 2.2 — ABNORMAL HIGH (ref 0.8–1.2)
Prothrombin Time: 25.7 s — ABNORMAL HIGH (ref 11.4–15.2)

## 2023-12-07 LAB — CBC
HCT: 47.8 % (ref 39.0–52.0)
Hemoglobin: 14.8 g/dL (ref 13.0–17.0)
MCH: 30.9 pg (ref 26.0–34.0)
MCHC: 31 g/dL (ref 30.0–36.0)
MCV: 99.8 fL (ref 80.0–100.0)
Platelets: 224 10*3/uL (ref 150–400)
RBC: 4.79 MIL/uL (ref 4.22–5.81)
RDW: 14.3 % (ref 11.5–15.5)
WBC: 11.2 10*3/uL — ABNORMAL HIGH (ref 4.0–10.5)
nRBC: 0 % (ref 0.0–0.2)

## 2023-12-07 LAB — PHENYTOIN LEVEL, TOTAL: Phenytoin Lvl: 19.9 ug/mL (ref 10.0–20.0)

## 2023-12-07 MED ORDER — BUDESONIDE 0.5 MG/2ML IN SUSP
0.5000 mg | Freq: Two times a day (BID) | RESPIRATORY_TRACT | Status: DC
  Administered 2023-12-07 – 2023-12-08 (×2): 0.5 mg via RESPIRATORY_TRACT
  Filled 2023-12-07 (×2): qty 2

## 2023-12-07 MED ORDER — WARFARIN SODIUM 3 MG PO TABS
3.5000 mg | ORAL_TABLET | Freq: Every day | ORAL | Status: DC
  Administered 2023-12-07: 3.5 mg via ORAL
  Filled 2023-12-07: qty 1

## 2023-12-07 MED ORDER — SODIUM CHLORIDE 0.9 % IV SOLN
1.0000 g | Freq: Three times a day (TID) | INTRAVENOUS | Status: DC
  Administered 2023-12-07 – 2023-12-08 (×3): 1 g via INTRAVENOUS
  Filled 2023-12-07 (×5): qty 20

## 2023-12-07 MED ORDER — IPRATROPIUM-ALBUTEROL 0.5-2.5 (3) MG/3ML IN SOLN
3.0000 mL | Freq: Four times a day (QID) | RESPIRATORY_TRACT | Status: DC
  Administered 2023-12-07 – 2023-12-08 (×4): 3 mL via RESPIRATORY_TRACT
  Filled 2023-12-07 (×4): qty 3

## 2023-12-07 MED ORDER — FUROSEMIDE 10 MG/ML IJ SOLN
40.0000 mg | Freq: Once | INTRAMUSCULAR | Status: AC
  Administered 2023-12-07: 40 mg via INTRAVENOUS
  Filled 2023-12-07: qty 4

## 2023-12-07 MED ORDER — LINEZOLID 600 MG/300ML IV SOLN
600.0000 mg | Freq: Two times a day (BID) | INTRAVENOUS | Status: DC
  Administered 2023-12-07 – 2023-12-08 (×2): 600 mg via INTRAVENOUS
  Filled 2023-12-07 (×5): qty 300

## 2023-12-07 NOTE — Progress Notes (Signed)
 HD#0 SUBJECTIVE:  Patient Summary: James Villegas is a 68 y.o. with a pertinent PMH of R sided CVA with hemiplegia, afib/a-flutter, HFpEF, chronin respiratory failure with hypercarbia, Protein c/s deficiency, lupus anticoagulant who presented with SOB and admitted 6/29 for Acute on chronic hypoxic respiratory failure and suspected HFpEF exacerbation.   Overnight Events: No acute events overnight  Interim History: pt seen and evaluated at the bedside this morning. He has become quite encephalopathic and is difficult to arouse, not engaging in conversation or making eye contact and only answering questions with no. This is significantly different than his baseline and how he presented on admission.   OBJECTIVE:  Vital Signs: Vitals:   12/07/23 0745 12/07/23 0800 12/07/23 1100 12/07/23 1150  BP:  (!) 122/53 122/62 121/62  Pulse: (!) 109 (!) 114  83  Resp:    (!) 24  Temp:   98.3 F (36.8 C) 98.4 F (36.9 C)  TempSrc:   Oral Oral  SpO2: (!) 82% 93% 100% 94%  Weight:    132.2 kg  Height:    6' 1 (1.854 m)   Supplemental O2: Nasal Cannula SpO2: 94 % O2 Flow Rate (L/min): 6 L/min  Filed Weights   12/06/23 0741 12/07/23 1150  Weight: 136.1 kg 132.2 kg     Intake/Output Summary (Last 24 hours) at 12/07/2023 1223 Last data filed at 12/07/2023 9357 Gross per 24 hour  Intake 24 ml  Output 700 ml  Net -676 ml   Net IO Since Admission: -676 mL [12/07/23 1223]  Physical Exam: Pt ill appearing and hard to arouse on exam--persistently falling back asleep Cardiac: Diaphoretic, Afib. Pitting edema to knees bilaterally. Notable edema with pitting in L hand.  Respiratory: tachypneic with diminished breath sounds bilaterally.  Abdomen: Obese abdomen, nontender to palpation.  Patient Lines/Drains/Airways Status     Active Line/Drains/Airways     Name Placement date Placement time Site Days   Peripheral IV 12/07/23 20 G 1.88 Anterior;Left Forearm 12/07/23  1044  Forearm  less than  1   Pressure Injury 02/20/20 Buttocks Mid;Right;Left Stage 2 -  Partial thickness loss of dermis presenting as a shallow open injury with a red, pink wound bed without slough. 02/20/20  2100  -- 1386   Pressure Injury 09/16/21 Buttocks Right;Left Stage 1 -  Intact skin with non-blanchable redness of a localized area usually over a bony prominence. non-blanchable reddness 09/16/21  1155  -- 812   Pressure Injury 10/20/23 Sacrum Medial Stage 1 -  Intact skin with non-blanchable redness of a localized area usually over a bony prominence. non blanchable redness 10/20/23  2100  -- 48   Wound / Incision (Open or Dehisced) 07/31/20 Non-pressure wound Abdomen Lower;Right partial thickness 07/31/20  1700  Abdomen  1224   Wound / Incision (Open or Dehisced) 07/31/20 Other (Comment) Sacrum Upper 07/31/20  1800  Sacrum  1224   Wound / Incision (Open or Dehisced) 07/31/20 Other (Comment) Buttocks Left nonblanchable; pink; healing 07/31/20  1800  Buttocks  1224   Wound / Incision (Open or Dehisced) 10/20/23 Irritant Dermatitis (Moisture Associated Skin Damage) Groin 10/20/23  2100  Groin  48            Pertinent labs and imaging:      Latest Ref Rng & Units 12/07/2023    5:00 AM 12/06/2023    8:03 AM 12/06/2023    7:58 AM  CBC  WBC 4.0 - 10.5 K/uL 11.2   10.9   Hemoglobin 13.0 -  17.0 g/dL 85.1  84.9  85.5   Hematocrit 39.0 - 52.0 % 47.8  44.0  45.5   Platelets 150 - 400 K/uL 224   226        Latest Ref Rng & Units 12/07/2023    5:00 AM 12/06/2023    8:03 AM 12/06/2023    7:58 AM  CMP  Glucose 70 - 99 mg/dL 879   867   BUN 8 - 23 mg/dL 8   7   Creatinine 9.38 - 1.24 mg/dL 9.35   9.41   Sodium 864 - 145 mmol/L 139  136  138   Potassium 3.5 - 5.1 mmol/L 3.8  4.0  4.0   Chloride 98 - 111 mmol/L 93   93   CO2 22 - 32 mmol/L 36   32   Calcium  8.9 - 10.3 mg/dL 8.9   8.4   Total Protein 6.5 - 8.1 g/dL 6.6   6.3   Total Bilirubin 0.0 - 1.2 mg/dL 0.5   0.7   Alkaline Phos 38 - 126 U/L 106   105   AST  15 - 41 U/L 14   17   ALT 0 - 44 U/L 13   13     VAS US  LOWER EXTREMITY VENOUS (DVT) Result Date: 12/07/2023  Lower Venous DVT Study Patient Name:  James Villegas  Date of Exam:   12/07/2023 Medical Rec #: [id number]       Accession #:    [id number] Date of Birth: [birth year]      Patient Gender: M Patient Age:   75 years Exam Location:  [facility] Procedure:      VAS US  LOWER EXTREMITY VENOUS (DVT) Referring Phys: JEFFREY HATCHER --------------------------------------------------------------------------------  Indications: Phlebitis/thrombophlebitis deep femoral right LE I80.291.  Risk Factors: None identified. Comparison Study: No prior studies. Performing Technologist: Cordella Collet RVT  Examination Guidelines: A complete evaluation includes B-mode imaging, spectral Doppler, color Doppler, and power Doppler as needed of all accessible portions of each vessel. Bilateral testing is considered an integral part of a complete examination. Limited examinations for reoccurring indications may be performed as noted. The reflux portion of the exam is performed with the patient in reverse Trendelenburg.  +---------+---------------+---------+-----------+----------+--------------+ RIGHT    CompressibilityPhasicitySpontaneityPropertiesThrombus Aging +---------+---------------+---------+-----------+----------+--------------+ CFV      Full           Yes      Yes                                 +---------+---------------+---------+-----------+----------+--------------+ SFJ      Full                                                        +---------+---------------+---------+-----------+----------+--------------+ FV Prox  Full                                                        +---------+---------------+---------+-----------+----------+--------------+ FV Mid   Full                                                         +---------+---------------+---------+-----------+----------+--------------+  FV DistalFull                                                        +---------+---------------+---------+-----------+----------+--------------+ PFV      Full                                                        +---------+---------------+---------+-----------+----------+--------------+ POP      Full           Yes      Yes                                 +---------+---------------+---------+-----------+----------+--------------+ PTV      Full                                                        +---------+---------------+---------+-----------+----------+--------------+ PERO     Full                                                        +---------+---------------+---------+-----------+----------+--------------+   +----+---------------+---------+-----------+----------+--------------+ LEFTCompressibilityPhasicitySpontaneityPropertiesThrombus Aging +----+---------------+---------+-----------+----------+--------------+ CFV Full           Yes      Yes                                 +----+---------------+---------+-----------+----------+--------------+     Summary: RIGHT: - There is no evidence of deep vein thrombosis in the lower extremity.  - No cystic structure found in the popliteal fossa.  LEFT: - No evidence of common femoral vein obstruction.   *See table(s) above for measurements and observations.    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 Likely chronic hypoventilation syndrome. Intermittently requiring 6L [place] overnight with tachypnea. Encephalopathy developed this AM with pt becoming difficult to arouse. Received 3 doses IV Lasix  40mg  since admission. Remains fluid overloaded with pitting edema to knees. Morning VBG showed pH 7.2 with pCO2 110. EKG pending. Will consult critical care due to significant hypercarbia and pt's inability to tolerate BiPAP.   -Cardiac monitoring -Continue to wean oxygen to home 4L [place] -Strict I's and O's -Daily BMP -Daily weights  #Suspected Lung infection CXR 6/29 showed interstitial prominence and hazy lung opacity in left lung. Possible pneumonia. Procalcitonin pending this AM. Will continue Rocephin   and Azithromycin  for 5d (last dose 7/4).   #UTI UA 6/29 showed significant leukocyte esterase and bacteria. Known hx of ESBL. Urine culture pending. Continue rocephin  and adjust as cultures result.   #RLE erythema Mild erythema noted on RLE, no changes since yesterday. TTP. Chronic lower extremity edema R>L. Vas US  6/30 showed no evidence of DVT. Likely chronic venous insufficiency.   #A-fib Afib with RBBB seen on admission EKG. Same as prior EKG in May 2025. On chronic warfarin therapy. INR 2.2 this AM. Cannot use DOAC therapy due to history of of Lupus Anticoagulant.   #Epilepsy Continue home Phenytoin  200mg  BID and 50mg  at bedtime. Will obtain phenytoin  level.   Best Practice: Diet: Cardiac diet IVF: Fluids: none, Rate: None VTE: SCDs Start: 12/06/23 1020 Code: Full  Disposition planning: Therapy Recs: Pending, DME: other pending Family Contact: Sister Ms. Gobert, to be notified. DISPO: Anticipated discharge pending to Nursing Home pending clinical improvement.  Signature:  Schuyler Novak, PGY-1 [facility]  12:23 PM, 12/07/2023  On Call pager [phone]

## 2023-12-07 NOTE — Progress Notes (Signed)
 Right lower extremity venous duplex has been completed. Preliminary results can be found in CV Proc through chart review.   12/07/23 10:00 AM Cathlyn Collet RVT

## 2023-12-07 NOTE — Progress Notes (Signed)
 Placed pt on BiPAP at this time per order. Pt tolerating well at this time

## 2023-12-07 NOTE — Progress Notes (Signed)
 Patient took bipap off due to him wanting to eat dinner. Attendings aware. Patient did well on 3L, no shortness of breath and more alert. Notified Attending and got orders to give patient a break from the bipap until bedtime unless increased somnolence. Will pass on to nightshift RN.

## 2023-12-07 NOTE — Progress Notes (Addendum)
 Asked by our day team to check on this patient due to significant hypercarbia during the day.  Went to go see the patient and he was on BiPAP which she has been on for the past 2 hours.  He was somnolent and did not fully wake up with loud verbal stimulation or soft physical stimulation.  Discussed with the nurse who had just evaluated the patient and he reports that the patient was more awake but that he wanted to go to sleep.  Due to her inability to fully waking up we will recheck a VBG however I do not think he is too encephalopathic to remove the BiPAP at this time so we will keep it on. - VBG, if no improvement in hypercarbia we will reevaluate and fully wake the patient up to assess for return of encephalopathy - held PM trazodone    Fairy Pool, DO Internal Medicine Resident, PGY-3 Please contact the on call pager at [phone] for any urgent or emergent needs. 8:13 PM 12/07/2023

## 2023-12-07 NOTE — Consult Note (Signed)
 NAME:  James Villegas, MRN:  [id number], DOB:  September 14, 1955, LOS: 0 ADMISSION DATE:  12/06/2023, CONSULTATION DATE:  12/07/23 REFERRING MD:  ITMS CHIEF COMPLAINT:  AMS/Hypercapnia   History of Present Illness:  68 yo M PMH hypoxic and hypercarbic respiratory failure on 4-5L O2 and CPAP, Severe COPD, suspected OSA/OHS, obesity, prior CVA with residual R sided deficits, epilepsy, afib/flutter, Protein C S def, lupus anticoagulant, Chronic AC on warfarin who presented to ED 6/29 from his [facility] for acute on chronic hypoxemic respiratory failure. He was admitted to IMTS and started on therapy for CHF with diuresis and azithromycin /ceftriaxone  for possible community acquired pneumonia.   PCCM consulted today as patient is somnolent and VBG showing elevated pCO2 level.   Unable to obtain history from patient but he arousable to tactile stimuli. He has not been placed on bipap due to concern for aspiration.  Pertinent  Medical History   Past Medical History:  Diagnosis Date   Acute on chronic congestive heart failure (HCC)    Anemia, secondary    SECONDARY TO ACUTE BLOOD LOSS   Anxiety disorder    Bloody stool 08/29/2011   intermittent along with constipation.    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 IVC filter   Seizure disorder (HCC)     Significant  Hospital Events: Including procedures, antibiotic start and stop dates in addition to other pertinent events   6/29 admitted to hospital 6/30 PCCM consulted  Interim History / Subjective:    As above  Objective    Blood pressure 121/62, pulse 83, temperature 98.4 F (36.9 C), temperature source Oral, resp. rate (!) 24, height 6' 1 (1.854 m), weight 132.2 kg, SpO2 94%.        Intake/Output Summary (Last 24 hours) at 12/07/2023 1259 Last data filed at 12/07/2023 9357 Gross per 24 hour  Intake 24 ml  Output 700 ml  Net -676 ml   Filed Weights   12/06/23 0741 12/07/23 1150  Weight: 136.1 kg 132.2 kg    Examination: General: elderly male, chronically ill appearing, no distress HENT: [place]/AT, moist mucous membranes, sclera anicteric Lungs: diminished but clear, no wheezing Cardiovascular: rrr, no murmurs Abdomen: soft, non-tender, non-distended Extremities: warm, 1-2+ lower extremity edema Neuro: PERRL, moves left arm/leg spontaneously GU: n/a  Resolved problem list   Assessment and Plan   Acute on Chronic Hypoxemic Hypercapnic Respiratory Therapy Acute on Chronic Congestive Heart Failure Obesity Hypoventilation and Obstructive Sleep Apnea  Possible aspiration pneumonia vs Healthcare Associated Pneumonia  Urinary Tract Infection Atrial Fibrillation Epilepsy  Plan: - start patient on trial of bipap for the hypercapnia, monitor closely for air way protection/compromise - scheduled duonebs 6 hours and budesonide  twice daily - stop breo inhaler - start linezolid and  meropenem, continue azithromycin  for HAP coverage given admission last month - Meropenem for UTI coverage based on prior urine culture.  - Aspiration precautions  PCCM will continue to follow  Best Practice (right click and Reselect all SmartList Selections daily)   Per primary team  Labs   CBC: Recent Labs  Lab 12/06/23 0758 12/06/23 0803 12/07/23 0500  WBC 10.9*  --  11.2*  NEUTROABS 9.0*  --    --   HGB 14.4 15.0 14.8  HCT 45.5 44.0 47.8  MCV 97.8  --  99.8  PLT 226  --  224    Basic Metabolic Panel: Recent Labs  Lab 12/06/23 0758 12/06/23 0803 12/07/23 0500  NA 138 136 139  K 4.0 4.0 3.8  CL 93*  --  93*  CO2 32  --  36*  GLUCOSE 132*  --  120*  BUN 7*  --  8  CREATININE 0.58*  --  0.64  CALCIUM  8.4*  --  8.9   GFR: Estimated Creatinine Clearance: 131.3 mL/min (by C-G formula based on SCr of 0.64 mg/dL). Recent Labs  Lab 12/06/23 0758 12/06/23 0804 12/07/23 0500  WBC 10.9*  --  11.2*  LATICACIDVEN  --  0.9  --     Liver Function Tests: Recent Labs  Lab 12/06/23 0758 12/07/23 0500  AST 17 14*  ALT 13 13  ALKPHOS 105 106  BILITOT 0.7 0.5  PROT 6.3* 6.6  ALBUMIN 2.9* 2.9*   No results for input(s): LIPASE, AMYLASE in the last 168 hours. No results for input(s): AMMONIA in the last 168 hours.  ABG    Component Value Date/Time   PHART 7.48 (H) 10/22/2023 1140   PCO2ART 65 (H) 10/22/2023 1140   PO2ART 123 (H) 10/22/2023 1140   HCO3 43.4 (H) 12/07/2023 1209   TCO2 42 (H) 12/06/2023 0803   O2SAT 67.8 12/07/2023 1209     Coagulation Profile: Recent Labs  Lab 12/06/23 0758 12/07/23 0500  INR 2.1* 2.2*    Cardiac Enzymes: No results for input(s): CKTOTAL, CKMB, CKMBINDEX, TROPONINI in the last 168 hours.  HbA1C: Hgb A1c MFr Bld  Date/Time Value Ref Range Status  12/16/2010 05:50 AM 5.4 <5.7 % Final    Comment:    (NOTE)                                                                       According to the ADA Clinical Practice Recommendations for 2011, when HbA1c is used as a screening test:  >=6.5%   Diagnostic of Diabetes Mellitus           (if abnormal result is confirmed) 5.7-6.4%   Increased risk of developing Diabetes Mellitus References:Diagnosis and Classification of Diabetes Mellitus,Diabetes Care,2011,34(Suppl 1):S62-S69 and Standards of Medical Care in         Diabetes - 2011,Diabetes Care,2011,34 (Suppl  1):S11-S61.  07/15/2008 01:20 PM  4.6 - 6.1 % Final   5.0 (NOTE)   The ADA recommends the following therapeutic goal for glycemic   control related to Hgb A1C measurement:   Goal of Therapy:   < 7.0% Hgb A1C   Reference: American Diabetes Association: Clinical Practice   Recommendations 2008, Diabetes Care,  2008, 31:(Suppl 1).  CBG: Recent Labs  Lab 12/06/23 1504  GLUCAP 111*    Review of Systems:   Unable to perform review of systems  Past Medical History:  He,  has a past medical history of Acute on chronic congestive heart failure (HCC), Anemia, secondary, Anxiety disorder, Bloody stool (08/29/2011), CVA (cerebral vascular accident) (HCC) (2010), Depression, DVT of lower extremity (deep venous thrombosis) (HCC), Dysphagia, Hyperlipidemia, Hypertension, Lupus anticoagulant disorder (HCC) (1990), Morbid obesity (HCC), PFO (patent foramen ovale), Protein C deficiency (HCC), Protein S deficiency (HCC), Rectus sheath hematoma (7/12), and Seizure disorder (HCC).   Surgical History:   Past Surgical History:  Procedure Laterality Date   dental extraction     multiple   vena cavogram  12/2010   INFERIOR     Social History:   reports that he quit smoking about 15 years ago. His smoking use included cigarettes and cigars. He started smoking about 30 years ago. He has a 15 pack-year smoking history. He has never used smokeless tobacco. He reports that he does not drink alcohol  and does not use drugs.   Family History:  His family history includes Sleep apnea in his sister.   Allergies Allergies  Allergen Reactions   Fenofibrate Other (See Comments)    Per MAR     Home Medications  Prior to Admission medications   Medication Sig Start Date End Date Taking? Authorizing Provider  acetaminophen  (TYLENOL ) 325 MG tablet Take 650 mg by mouth every 6 (six) hours as needed for mild pain, headache or fever.   Yes [provider]  antiseptic oral rinse (BIOTENE) LIQD 1  Application by Mouth Rinse route at bedtime.   Yes [provider]  Baclofen  5 MG TABS Take 5 mg by mouth at bedtime. 02/19/23  Yes [provider]  bisacodyl  (DULCOLAX) 5 MG EC tablet Take 10 mg by mouth daily as needed for moderate constipation.   Yes [provider]  Carboxymeth-Glycerin -Polysorb (REFRESH OPTIVE ADVANCED) 0.5-1-0.5 % SOLN Place 1 drop into both eyes in the morning, at noon, in the evening, and at bedtime. Wait 3-5 minutes between eye medications   Yes [provider]  cycloSPORINE  (RESTASIS ) 0.05 % ophthalmic emulsion Place 1 drop into both eyes 2 (two) times daily.   Yes [provider]  DULoxetine  (CYMBALTA ) 30 MG capsule Take 30 mg by mouth in the morning. 08/31/23  Yes [provider]  empagliflozin  (JARDIANCE ) 10 MG TABS tablet Take by mouth daily.   Yes [provider]  ezetimibe  (ZETIA ) 10 MG tablet Take 10 mg by mouth daily.   Yes [provider]  fluticasone -salmeterol (ADVAIR) 250-50 MCG/ACT AEPB Inhale 1 puff into the lungs in the morning and at bedtime.   Yes [provider]  guaiFENesin (ROBITUSSIN) 100 MG/5ML liquid Take 15 mLs by mouth every 4 (four) hours as needed for cough.   Yes [provider]  ipratropium-albuterol  (DUONEB) 0.5-2.5 (3) MG/3ML SOLN Take 3 mLs by nebulization every 4 (four) hours as needed. 10/23/23  Yes Elnora Ip, MD  omeprazole  (PRILOSEC) 20 MG capsule Take 20 mg by mouth every other day. 08/01/21  Yes [provider]  phenytoin  (DILANTIN  INFATABS) 50 MG tablet Chew 50 mg by mouth at bedtime. Give with 200mg  capsules for total dose of 250mg  qhs   Yes [provider]  phenytoin  (DILANTIN ) 100 MG ER capsule Take 200 mg by mouth 2 (two) times daily.   Yes [provider]  polyvinyl alcohol  (LIQUIFILM TEARS) 1.4 % ophthalmic solution  Place 1 drop into both eyes 4 (four) times daily. 10/23/23  Yes Elnora Ip, MD   potassium chloride  SA (KLOR-CON ) 20 MEQ tablet Take 2 tablets (40 mEq total) by mouth daily. 08/08/20  Yes Cheryle Page, MD  rosuvastatin  (CRESTOR ) 40 MG tablet Take 40 mg by mouth at bedtime.   Yes [provider]  spironolactone  (ALDACTONE ) 25 MG tablet Take 1 tablet (25 mg total) by mouth daily. 09/22/21 12/06/23 Yes Shahmehdi, Adriana LABOR, MD  torsemide  (DEMADEX ) 20 MG tablet Take 40 mg by mouth in the morning and at bedtime. 08/31/23  Yes [provider]  traZODone  (DESYREL ) 50 MG tablet Take 25 mg by mouth at bedtime. 08/01/21  Yes [provider]  warfarin (COUMADIN ) 1 MG tablet Take 3.5 mg by mouth daily.   Yes [provider]  losartan (COZAAR) 25 MG tablet Take 12.5 mg by mouth daily. Patient not taking: Reported on 12/06/2023    [provider]  OXYGEN Inhale 4 L into the lungs continuous.    [provider]     Critical care time: 35 minutes    Dorn Chill, MD [facility] Office: [phone]   See Amion for personal pager PCCM on call pager [phone] until 7pm. Please call Elink 7p-7a. [phone]

## 2023-12-07 NOTE — Progress Notes (Signed)
 Heart Failure Navigator Progress Note  Assessed for Heart & Vascular TOC clinic readiness.  Patient does not meet criteria due to EF 65-70%, per MD note patient is wheel chair bound and with limited speaking abilities, from previous stroke, does answer questions with one word answers. No HF TOC.   Navigator will sign off at this time.   Stephane Haddock, BSN, Scientist, clinical (histocompatibility and immunogenetics) Only

## 2023-12-07 NOTE — TOC Initial Note (Signed)
 Transition of Care [facility]) - Initial/Assessment Note    Patient Details  Name: James Villegas MRN: [id number] Date of Birth: 1956-03-27  Transition of Care [facility]) CM/SW Contact:    Debarah Saunas, RN Phone Number: 12/07/2023, 9:02 AM  Clinical Narrative:                 68 year old male presents with shortness of breath and concern for CHF.  Plans to return to [facility] when medically stable. On 4L [place], Max assist with most ADLs.    Barriers to Discharge: Continued Medical Work up   Patient Goals and CMS Choice Patient states their goals for this hospitalization and ongoing recovery are:: to return to [place]   Choice offered to / list presented to : NA      Expected Discharge Plan and Services    Return to [place]                                          Prior Living Arrangements/Services    [employer]                   Activities of Daily Living   ADL Screening (condition at time of admission) Independently performs ADLs?: No Does the patient have a NEW difficulty with bathing/dressing/toileting/self-feeding that is expected to last >3 days?: No Does the patient have a NEW difficulty with getting in/out of bed, walking, or climbing stairs that is expected to last >3 days?: No Does the patient have a NEW difficulty with communication that is expected to last >3 days?: No Is the patient deaf or have difficulty hearing?: Yes Does the patient have difficulty seeing, even when wearing glasses/contacts?: No Does the patient have difficulty concentrating, remembering, or making decisions?: No  Permission Sought/Granted                  Emotional Assessment              Admission diagnosis:  Acute exacerbation of CHF (congestive heart failure) (HCC) [I50.9] Patient Active Problem List   Diagnosis Date Noted   Acute exacerbation of CHF (congestive heart failure) (HCC) 12/06/2023   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) 12/06/2023   Mild protein-calorie malnutrition (HCC) 12/06/2023   Acute hypoxic respiratory failure (HCC) 10/20/2023   Acute respiratory failure with hypoxia and hypercarbia (HCC) 08/19/2022   History of cardioembolic cerebrovascular accident (CVA) 08/19/2022   Acute on chronic respiratory failure with hypoxia and hypercapnia (HCC) secondary to acute on chronic diastolic CHF exacerbation 09/16/2021   Hyponatremia 09/16/2021   History of DVT (deep vein thrombosis) 09/16/2021   GERD (gastroesophageal reflux disease) 09/16/2021   Aneurysm of ascending aorta (HCC)    Hypoxia    Acute on chronic heart failure with preserved ejection fraction (HCC) 07/30/2020   Acute on chronic diastolic CHF (congestive heart failure) (HCC) 07/30/2020   Acute on chronic respiratory failure with hypercapnia (HCC) 07/30/2020   Pressure injury of skin 02/22/2020   AKI (acute kidney injury) (HCC)    HCAP (healthcare-associated pneumonia)    Acute metabolic encephalopathy    Hypercoagulable state (HCC)    Seizure disorder (HCC)    Chronic diastolic CHF (congestive heart failure) (HCC)    Atrial fibrillation, chronic (HCC) on chronic anticoagulation  protein S and C deficiency    History of CVA with residual deficit  Hyperlipidemia    Acute respiratory failure (HCC) 02/20/2020   CAP (community acquired pneumonia) 02/20/2020   Obesity, Class III, BMI 40-49.9 (morbid obesity) 02/20/2020   Bradycardia 02/20/2020   Hyperkalemia 02/20/2020   Hypercarbia 02/20/2020   Acute on chronic congestive heart failure (HCC)    Permanent atrial fibrillation (HCC)    Respiratory failure (HCC) 08/02/2018   Anemia of other chronic disease 11/23/2013   Pain in joint, ankle and foot 11/14/2013   Other convulsions 10/19/2013   Noninfectious gastroenteritis and colitis 07/29/2013   Left upper quadrant abdominal mass 07/28/2013   Other specified disease of white blood cells 07/16/2013    Ingrowing toenail with infection 05/20/2013   Late effects of cerebrovascular disease 10/15/2012   Pure hypercholesterolemia 10/15/2012   Essential hypertension, benign 10/15/2012   Hereditary and idiopathic peripheral neuropathy 10/15/2012   Cerebral infarction (HCC) 08/29/2011   DVT (deep venous thrombosis) (HCC) 08/29/2011   Bloody stool 08/29/2011   PFO (patent foramen ovale) 02/18/2011   PCP:  Feliciano Devoria LABOR, MD Pharmacy:   [facility] - [place], [place] - [address] [facility] AT Portal to Reg[facility] [address] [place] [address] Phone: [phone] Fax: [phone]     Social Drivers of Health (SDOH) Social History: SDOH Screenings   Food Insecurity: No Food Insecurity (10/21/2023)  Housing: Low Risk  (10/21/2023)  Transportation Needs: No Transportation Needs (10/21/2023)  Utilities: Not At Risk (10/21/2023)  Social Connections: Unknown (10/21/2023)  Tobacco Use: Medium Risk (12/06/2023)   SDOH Interventions:     Readmission Risk Interventions    08/26/2022   11:56 AM  Readmission Risk Prevention Plan  Transportation Screening Complete  PCP or Specialist Appt within 5-7 Days Complete  Home Care Screening Complete  Medication Review (RN CM) Referral to Pharmacy

## 2023-12-07 NOTE — Progress Notes (Signed)
 Date and time results received: 12/07/23 12:33 (use smartphrase .now to insert current time)  Test: Blood gas, venous Critical Value: 110  Name of Provider Notified: Hadassah Hussar MD  Orders Received? Or Actions Taken?:  Awaiting for orders

## 2023-12-07 NOTE — Progress Notes (Signed)
 PHARMACY - ANTICOAGULATION CONSULT NOTE  Pharmacy Consult for warfarin Indication: atrial fibrillation  Allergies  Allergen Reactions   Fenofibrate Other (See Comments)    Per [facility]    Patient Measurements: Height: 6' 1 (185.4 cm) Weight: 136.1 kg (300 lb) IBW/kg (Calculated) : 79.9 HEPARIN  DW (KG): 110.7  Vital Signs: Temp: 97.8 F (36.6 C) (06/29 2043) Temp Source: Axillary (06/29 2043) BP: 117/72 (06/30 0153) Pulse Rate: 119 (06/30 0153)  Labs: Recent Labs    12/06/23 0758 12/06/23 0803 12/06/23 0936 12/07/23 0500  HGB 14.4 15.0  --  14.8  HCT 45.5 44.0  --  47.8  PLT 226  --   --  224  LABPROT 24.5*  --   --  25.7*  INR 2.1*  --   --  2.2*  CREATININE 0.58*  --   --  0.64  TROPONINIHS 45*  --  31*  --     Estimated Creatinine Clearance: 133.3 mL/min (by C-G formula based on SCr of 0.64 mg/dL).   Medical History: Past Medical History:  Diagnosis Date   Acute on chronic congestive heart failure (HCC)    Anemia, secondary    SECONDARY TO ACUTE BLOOD LOSS   Anxiety disorder    Bloody stool 08/29/2011   intermittent along with constipation.    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 IVC filter   Seizure disorder [facility])     Assessment: 58 YOM presenting with SOB, hx of afib on warfarin PTA with last dose taken 6/28. PTA  dosing: 3.5 mg daily  Of note, patient currently ordered azithromycin  inpatient. Concurrent use of azithromycin  and warfarin may result in an increased INR.  NR therapeutic at 2.2. Will continue home dose with close monitoring.  Goal of Therapy:  INR 2-3 Monitor platelets by anticoagulation protocol: Yes   Plan:  Give warfarin 3.5 mg PO daily Check INR daily while on warfarin Continue to monitor H&H and platelets   Thank you for allowing pharmacy to be a part of this patient's care.  Shelba Collier, PharmD, BCPS Clinical Pharmacist

## 2023-12-07 NOTE — Care Management Obs Status (Signed)
 MEDICARE OBSERVATION STATUS NOTIFICATION   Patient Details  Name: REMBERTO Villegas MRN: [id number] Date of Birth: 1955/11/07   Medicare Observation Status Notification Given:  Yes    Debarah Saunas, RN 12/07/2023, 9:06 AM

## 2023-12-08 DIAGNOSIS — I5033 Acute on chronic diastolic (congestive) heart failure: Secondary | ICD-10-CM | POA: Diagnosis not present

## 2023-12-08 DIAGNOSIS — E441 Mild protein-calorie malnutrition: Secondary | ICD-10-CM | POA: Diagnosis not present

## 2023-12-08 DIAGNOSIS — G4733 Obstructive sleep apnea (adult) (pediatric): Secondary | ICD-10-CM | POA: Diagnosis not present

## 2023-12-08 DIAGNOSIS — I69951 Hemiplegia and hemiparesis following unspecified cerebrovascular disease affecting right dominant side: Secondary | ICD-10-CM | POA: Diagnosis not present

## 2023-12-08 DIAGNOSIS — J9621 Acute and chronic respiratory failure with hypoxia: Secondary | ICD-10-CM | POA: Diagnosis not present

## 2023-12-08 DIAGNOSIS — N390 Urinary tract infection, site not specified: Secondary | ICD-10-CM | POA: Diagnosis not present

## 2023-12-08 LAB — URINE CULTURE: Culture: 80000 — AB

## 2023-12-08 LAB — BASIC METABOLIC PANEL WITH GFR
Anion gap: 8 (ref 5–15)
BUN: 11 mg/dL (ref 8–23)
CO2: 38 mmol/L — ABNORMAL HIGH (ref 22–32)
Calcium: 8.7 mg/dL — ABNORMAL LOW (ref 8.9–10.3)
Chloride: 90 mmol/L — ABNORMAL LOW (ref 98–111)
Creatinine, Ser: 0.57 mg/dL — ABNORMAL LOW (ref 0.61–1.24)
GFR, Estimated: >60 mL/min (ref >60)
Glucose, Bld: 123 mg/dL — ABNORMAL HIGH (ref 70–99)
Potassium: 3.4 mmol/L — ABNORMAL LOW (ref 3.5–5.1)
Sodium: 136 mmol/L (ref 135–145)

## 2023-12-08 LAB — PROTIME-INR
INR: 2.6 — ABNORMAL HIGH (ref 0.8–1.2)
Prothrombin Time: 28.7 s — ABNORMAL HIGH (ref 11.4–15.2)

## 2023-12-08 LAB — CBC
HCT: 44.2 % (ref 39.0–52.0)
Hemoglobin: 13.8 g/dL (ref 13.0–17.0)
MCH: 30.5 pg (ref 26.0–34.0)
MCHC: 31.2 g/dL (ref 30.0–36.0)
MCV: 97.8 fL (ref 80.0–100.0)
Platelets: 224 10*3/uL (ref 150–400)
RBC: 4.52 MIL/uL (ref 4.22–5.81)
RDW: 13.9 % (ref 11.5–15.5)
WBC: 7.4 10*3/uL (ref 4.0–10.5)
nRBC: 0 % (ref 0.0–0.2)

## 2023-12-08 MED ORDER — POTASSIUM CHLORIDE 20 MEQ PO PACK: 60.0000 meq | PACK | Freq: Once | ORAL | Status: DC

## 2023-12-08 MED ORDER — AMOXICILLIN-POT CLAVULANATE 875-125 MG PO TABS: 1.0000 | ORAL_TABLET | Freq: Two times a day (BID) | ORAL | Status: DC

## 2023-12-08 MED ORDER — LINEZOLID 600 MG PO TABS
600.0000 mg | ORAL_TABLET | Freq: Two times a day (BID) | ORAL | Status: DC
Start: 2023-12-08 — End: 2023-12-11

## 2023-12-08 MED ORDER — WARFARIN SODIUM 2 MG PO TABS
2.0000 mg | ORAL_TABLET | Freq: Every day | ORAL | Status: DC
Start: 2023-12-08 — End: 2023-12-11

## 2023-12-08 MED ORDER — POTASSIUM CHLORIDE CRYS ER 20 MEQ PO TBCR
60.0000 meq | EXTENDED_RELEASE_TABLET | Freq: Once | ORAL | Status: AC
  Administered 2023-12-08: 60 meq via ORAL
  Filled 2023-12-08: qty 3

## 2023-12-08 MED ORDER — TORSEMIDE 20 MG PO TABS
40.0000 mg | ORAL_TABLET | Freq: Two times a day (BID) | ORAL | Status: DC
  Administered 2023-12-08: 40 mg via ORAL
  Filled 2023-12-08: qty 2

## 2023-12-08 MED ORDER — DOXYCYCLINE HYCLATE 100 MG PO TABS: 100.0000 mg | ORAL_TABLET | Freq: Two times a day (BID) | ORAL | Status: DC

## 2023-12-08 MED ORDER — WARFARIN SODIUM 2 MG PO TABS: 2.0000 mg | ORAL_TABLET | Freq: Every day | ORAL | Status: DC

## 2023-12-08 NOTE — Progress Notes (Signed)
 Attempted to call twice to [facility] facility. Will try again at a later time.

## 2023-12-08 NOTE — TOC Initial Note (Signed)
 Transition of Care [facility]) - Initial/Assessment Note    Patient Details  Name: James Villegas MRN: [id number] Date of Birth: 12-Feb-1956  Transition [facility]) CM/SW Contact:    Lauraine FORBES Saa, LCSW Phone Number: 12/08/2023, 9:46 AM  Clinical Narrative:                  9:46 AM Per chart review, patient is from [facility]. SNF admissions confirmed patient is able to return upon medical readiness. Patient has a PCP and insurance.  Expected Discharge Plan: Skilled Nursing Facility Barriers to Discharge: Continued Medical Work up   Patient Goals and CMS Choice Patient states their goals for this hospitalization and ongoing recovery are:: to return to [facility]   Choice offered to / list presented to : NA      Expected Discharge Plan and Services In-house Referral: Clinical Social Work   Post Acute Care Choice: Skilled Nursing Facility Living arrangements for the past 2 months: Skilled Nursing Facility                                      Prior Living Arrangements/Services Living arrangements for the past 2 months: Skilled Nursing Facility Lives with:: Facility Resident Patient language and need for interpreter reviewed:: Yes          Care giver support system in place?: Yes (comment)   Criminal Activity/Legal Involvement Pertinent to Current Situation/Hospitalization: No - Comment as needed  Activities of Daily Living   ADL Screening (condition at time of admission) Independently performs ADLs?: No Does the patient have a NEW difficulty with bathing/dressing/toileting/self-feeding that is expected to last >3 days?: No Does the patient have a NEW difficulty with getting in/out of bed, walking, or climbing stairs that is expected to last >3 days?: No Does the patient have a NEW difficulty with communication that is expected to last >3 days?: No Is the patient deaf or have difficulty hearing?: Yes Does the patient have difficulty seeing, even  when wearing glasses/contacts?: No Does the patient have difficulty concentrating, remembering, or making decisions?: No  Permission Sought/Granted Permission sought to share information with : Family Supports, Oceanographer granted to share information with : No (Contact information on chart)  Share Information with NAME: Eri Platten  Permission granted to share info w AGENCY: SNF  Permission granted to share info w Relationship: Sister  Permission granted to share info w Contact Information: [phone]  Emotional Assessment       Orientation: : Oriented to Self, Oriented to Place, Oriented to  Time, Oriented to Situation Alcohol  / Substance Use: Not Applicable Psych Involvement: No (comment)  Admission diagnosis:  Acute exacerbation of CHF (congestive heart failure) (HCC) [I50.9] Acute on chronic respiratory failure with hypoxia (HCC) [J96.21] Patient Active Problem List   Diagnosis Date Noted   Acute exacerbation of CHF (congestive heart failure) (HCC) 12/06/2023   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) 12/06/2023   Mild protein-calorie malnutrition (HCC) 12/06/2023   Acute hypoxic respiratory failure (HCC) 10/20/2023   Acute respiratory failure with hypoxia and hypercarbia (HCC) 08/19/2022   History of cardioembolic cerebrovascular accident (CVA) 08/19/2022   Acute on chronic respiratory failure with hypoxia and hypercapnia (HCC) secondary to acute on chronic diastolic CHF exacerbation 09/16/2021   Hyponatremia 09/16/2021   History of DVT (deep vein thrombosis) 09/16/2021   GERD (  gastroesophageal reflux disease) 09/16/2021   Aneurysm of ascending aorta (HCC)    Hypoxia    Acute on chronic heart failure with preserved ejection fraction (HCC) 07/30/2020   Acute on chronic diastolic CHF (congestive heart failure) (HCC) 07/30/2020   Acute on chronic respiratory failure with hypercapnia (HCC)  07/30/2020   Pressure injury of skin 02/22/2020   AKI (acute kidney injury) (HCC)    HCAP (healthcare-associated pneumonia)    Acute metabolic encephalopathy    Hypercoagulable state (HCC)    Seizure disorder (HCC)    Chronic diastolic CHF (congestive heart failure) (HCC)    Atrial fibrillation, chronic (HCC) on chronic anticoagulation  protein S and C deficiency    History of CVA with residual deficit    Hyperlipidemia    Acute respiratory failure (HCC) 02/20/2020   CAP (community acquired pneumonia) 02/20/2020   Obesity, Class III, BMI 40-49.9 (morbid obesity) 02/20/2020   Bradycardia 02/20/2020   Hyperkalemia 02/20/2020   Hypercarbia 02/20/2020   Acute on chronic congestive heart failure (HCC)    Permanent atrial fibrillation (HCC)    Respiratory failure (HCC) 08/02/2018   Anemia of other chronic disease 11/23/2013   Pain in joint, ankle and foot 11/14/2013   Other convulsions 10/19/2013   Noninfectious gastroenteritis and colitis 07/29/2013   Left upper quadrant abdominal mass 07/28/2013   Other specified disease of white blood cells 07/16/2013   Ingrowing toenail with infection 05/20/2013   Late effects of cerebrovascular disease 10/15/2012   Pure hypercholesterolemia 10/15/2012   Essential hypertension, benign 10/15/2012   Hereditary and idiopathic peripheral neuropathy 10/15/2012   Cerebral infarction (HCC) 08/29/2011   DVT (deep venous thrombosis) (HCC) 08/29/2011   Bloody stool 08/29/2011   PFO (patent foramen ovale) 02/18/2011   PCP:  Feliciano Devoria LABOR, MD Pharmacy:   [facility] - [place], [place] - [address] [facility] Portal to Reg[facility] [address] [place] [address] Phone: [phone] Fax: [phone]     Social Drivers of Health (SDOH) Social History: SDOH Screenings   Food Insecurity: Patient Unable To Answer (12/07/2023)  Housing: Patient Unable To Answer (12/07/2023)  Transportation Needs:  Patient Unable To Answer (12/07/2023)  Utilities: Patient Unable To Answer (12/07/2023)  Social Connections: Moderately Integrated (12/08/2023)  Tobacco Use: Medium Risk (12/06/2023)   SDOH Interventions:     Readmission Risk Interventions    08/26/2022   11:56 AM  Readmission Risk Prevention Plan  Transportation Screening Complete  PCP or Specialist Appt within 5-7 Days Complete  Home Care Screening Complete  Medication Review (RN CM) Referral to Pharmacy

## 2023-12-08 NOTE — Plan of Care (Signed)

## 2023-12-08 NOTE — Discharge Summary (Addendum)
 Name: James Villegas MRN: [id number] DOB: 1955-09-19 68 y.o. PCP: Feliciano Devoria LABOR, MD  Date of Admission: 12/06/2023  7:33 AM Date of Discharge: No discharge date for patient encounter. Attending Physician: Dr. MICAEL Riis Winfrey  Discharge Diagnosis: 1. 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 Commonly known as: COZAAR Take 12.5 mg by mouth daily.       STOP taking these medications    potassium chloride  SA 20 MEQ tablet Commonly known as: KLOR-CON  M       TAKE these medications    acetaminophen  325 MG tablet Commonly known as: TYLENOL  Take 650 mg by mouth every 6 (six) hours as needed for mild pain, headache or fever.   amoxicillin-clavulanate 875-125 MG tablet Commonly known as: AUGMENTIN Take 1 tablet by mouth 2 (two) times daily for 3 days.   antiseptic oral rinse Liqd 1 Application by Mouth Rinse route at bedtime.   artificial tears ophthalmic solution Place 1 drop into both eyes 4 (four) times daily.   Baclofen  5 MG Tabs Take 5 mg by mouth at bedtime.   bisacodyl  5 MG EC tablet Commonly known as: DULCOLAX Take 10 mg by mouth daily as needed for moderate constipation.   cycloSPORINE  0.05 % ophthalmic emulsion Commonly known as: RESTASIS  Place 1 drop into both eyes 2 (two) times daily.   Dilantin  Infatabs 50 MG tablet Generic drug: phenytoin  Chew 50 mg by mouth at bedtime. Give with 200mg  capsules  for total dose of 250mg  qhs   doxycycline  100 MG tablet Commonly known as: VIBRA -TABS Take 1 tablet (100 mg total) by mouth 2 (two) times daily for 3 days.   DULoxetine  30 MG capsule Commonly known as: CYMBALTA  Take 30 mg by mouth in the morning.   ezetimibe  10 MG tablet Commonly known as: ZETIA  Take 10 mg by mouth daily.   fluticasone -salmeterol 250-50 MCG/ACT Aepb Commonly known as: ADVAIR Inhale 1 puff into the lungs in the morning and at bedtime.   guaiFENesin 100 MG/5ML liquid Commonly known as: ROBITUSSIN Take 15 mLs by mouth every 4 (four) hours as needed for cough.   ipratropium-albuterol  0.5-2.5 (3) MG/3ML Soln Commonly known as: DUONEB Take 3 mLs by nebulization every 4 (four) hours as needed.   Jardiance  10 MG Tabs tablet Generic drug: empagliflozin  Take by mouth daily.   linezolid 600 MG tablet Commonly known as: ZYVOX Take 1 tablet (600 mg total) by mouth 2 (two) times daily for 4 days.   omeprazole  20 MG capsule Commonly known as: PRILOSEC Take 20 mg by mouth every other day.   OXYGEN Inhale 4 L into the lungs continuous.   phenytoin  100 MG ER capsule Commonly known as: DILANTIN  Take 200 mg by mouth 2 (two) times daily.   Refresh Optive Advanced 0.5-1-0.5 % Soln Generic drug: Carboxymeth-Glycerin -Polysorb Place 1 drop into both eyes in the morning, at noon, in the  evening, and at bedtime. Wait 3-5 minutes between eye medications   rosuvastatin  40 MG tablet Commonly known as: CRESTOR  Take 40 mg by mouth at bedtime.   spironolactone  25 MG tablet Commonly known as: ALDACTONE  Take 1 tablet (25 mg total) by mouth daily.   torsemide  20 MG tablet Commonly known as: DEMADEX  Take 40 mg by mouth in the morning and at bedtime.   traZODone  50 MG tablet Commonly known as: DESYREL  Take 25 mg by mouth at bedtime.   warfarin 2 MG tablet Commonly known as: COUMADIN  Take as directed. If you are unsure how to take this medication, talk to your nurse or  doctor. Original instructions: Take 1 tablet (2 mg total) by mouth daily at 4 PM. What changed:  medication strength how much to take when to take this        Disposition and follow-up:   [name] was discharged from [facility] in Stable condition.  At the hospital follow up visit please address:  Chronic Hypoxic Hypercarbic respiratory failure Acute on chronic HFpEF - Requires BiPAP at night and with naps -Settings use here include NIV pressure control with PEEP 6, PC above PEEP 10,  IPAP 16, EPAP 6, FiO2 40%, RR 15, resulted in a minute ventilation of 8.2L/min - Continued Torsemide  therapy  Broad Spectrum Pneumonia coverage -Ensure completion of 5 day course of Zyvox (Linezolid) for MRSA coverage -Ensure completion 3 day course of Doxycycline  for atypical coverage -Ensure completion of 3 day course of Augmentin for CAP coverage  Monitor for signs of symptoms of Serotonin Syndrome -Due to potential interaction of linezolid and cymbalta   2.  Labs / imaging needed at time of follow-up: BMP, INR  3.  Pending labs/ test needing follow-up: Blood Cultures  Follow-up Appointments:    Hospital Course by problem list: KARRY CAUSER is a 68 y.o. person living with a history of R sided CVA with hemiplegia, afib/aflutter, HFpEF, chronic respiratory failure with hypercarbia, and lupus anticoagulant who presented with SOB and was admitted for Acute on chronic hypoxic respiratory failure now bring discharge on hospital day 1 with the following pertinent hospital course:  Chronic Hypoxic Hypercarbic respiratory failure Acute on chronic HFpEF Suspected pneumonia At admission, the patient was volume overloaded and requiring increased O2 (6L increased form home 4L). He was initially diuresed with lasix  in the ER and placed on cardiac monitoring. He is chronically in a-fib and was continued on warfarin. This dose was adjusted to 2mg  to achieve therapeutic INR of  2-3. ON 6/30 he was found to be difficult to arouse, diaphoretic, ane tachypneic requiring 6L [place]. VBG was done and pt was hypercarbic with CO2. Critical care was consulted and agreed to place pt on BiPAP with aspiration precautions. Throughout the night, his encephalopathy continued to improve. Last VBG showed CO2 79 which is close to baseline. CXR showed pulmonary edema vs. Infiltrates and his nasal swab was positive for MRSA. He is complete a 5 day course of Linezolid, doxycycline , and Augmentin upon discharge. He will also need to wear BiPAP nightly and when taking naps.  E coli urine colonization During admission, a UA was done and he was found to have ESBL with >80,000 colonies. This was also present at last admission in may. Patient empirically treated with merepenem for one day. However, pt likely colonized given no symptoms and recent similar infection. Abx for UTI were discontinued. 7/1 patient significantly improved and deemed fit to discharge and return to his facility.  Will need continued monitoring for signs and symptoms of UTI.  Stable chronic medical conditions: Afib/aflutter - on warfarin; will need INR adjustment  Hypertension  HFpEF- continued on prior to admission spironolactone , Torsemide , Jardiance  Epilepsy - Continued on phenytoin  per SNF 4. Insomnia - continued on Trazodone  25 mg nightly   Subjective Pt seen and examined at the bedside this morning. Significantly improved since exam yesterday. Alert and oriented to self, place, and situation. He is pleasant and conversant with the team and would like to know when he can go home. He states that he does have a BiPAP at his facility and is compliant with weaning.   Discharge Exam:   BP 112/66 (BP Location: Left Arm)   Pulse 87   Temp 98.7 F (37.1 C) (Oral)   Resp 20   Ht 6' 1 (1.854 m)   Wt 131.5 kg   SpO2 96%   BMI 38.25 kg/m  Discharge exam:  Pt awake and alert in no signs of distress.  Cardiac: RRR, mild pitting  edema to the shin in L lower leg. Pitting edema to knees in R leg, likely chronic and due to dependence as this is the side affected by his stroke.  Respiratory: No increased work of breathing,  on 3L in place Extremities: Mild erythema in Right leg, likely chronic venous status    Pertinent Labs, Studies, and Procedures:     Latest Ref Rng & Units 12/08/2023    3:24 AM 12/07/2023    5:00 AM 12/06/2023    8:03 AM  CBC  WBC 4.0 - 10.5 K/uL 7.4  11.2    Hemoglobin 13.0 - 17.0 g/dL 86.1  85.1  84.9   Hematocrit 39.0 - 52.0 % 44.2  47.8  44.0   Platelets 150 - 400 K/uL 224  224         Latest Ref Rng & Units 12/08/2023    3:24 AM 12/07/2023    5:00 AM 12/06/2023    8:03 AM  CMP  Glucose 70 - 99 mg/dL 876  879    BUN 8 - 23 mg/dL 11  8    Creatinine 9.38 - 1.24 mg/dL 9.42  9.35    Sodium 864 - 145 mmol/L 136  139  136   Potassium 3.5 - 5.1 mmol/L 3.4  3.8  4.0   Chloride 98 - 111 mmol/L 90  93    CO2 22 - 32 mmol/L 38  36    Calcium  8.9 - 10.3 mg/dL 8.7  8.9    Total Protein 6.5 - 8.1 g/dL  6.6    Total Bilirubin 0.0 - 1.2 mg/dL  0.5    Alkaline Phos 38 - 126 U/L  106    AST 15 - 41 U/L  14    ALT 0 - 44 U/L  13      VAS US  LOWER EXTREMITY VENOUS (DVT) Result Date: 12/07/2023  Lower Venous DVT Study Patient Name:  DEYONTE CADDEN Pinette  Date of Exam:   12/07/2023 Medical Rec #: [id number]       Accession #:    [id number] Date of Birth: [birth year]      Patient Gender: M Patient Age:   89 years Exam Location:  [facility] Procedure:      VAS US  LOWER EXTREMITY VENOUS (DVT) Referring Phys: JEFFREY HATCHER --------------------------------------------------------------------------------  Indications: Phlebitis/thrombophlebitis deep femoral right LE I80.291.  Risk Factors: None identified. Comparison Study: No prior studies. Performing Technologist: Cordella Collet RVT  Examination Guidelines:  A complete evaluation includes B-mode imaging, spectral Doppler, color Doppler, and power Doppler as  needed of all accessible portions of each vessel. Bilateral testing is considered an integral part of a complete examination. Limited examinations for reoccurring indications may be performed as noted. The reflux portion of the exam is performed with the patient in reverse Trendelenburg.  +---------+---------------+---------+-----------+----------+--------------+ RIGHT    CompressibilityPhasicitySpontaneityPropertiesThrombus Aging +---------+---------------+---------+-----------+----------+--------------+ CFV      Full           Yes      Yes                                 +---------+---------------+---------+-----------+----------+--------------+ SFJ      Full                                                        +---------+---------------+---------+-----------+----------+--------------+ FV Prox  Full                                                        +---------+---------------+---------+-----------+----------+--------------+ FV Mid   Full                                                        +---------+---------------+---------+-----------+----------+--------------+ FV DistalFull                                                        +---------+---------------+---------+-----------+----------+--------------+ PFV      Full                                                        +---------+---------------+---------+-----------+----------+--------------+ POP      Full           Yes      Yes                                 +---------+---------------+---------+-----------+----------+--------------+ PTV      Full                                                        +---------+---------------+---------+-----------+----------+--------------+ PERO     Full                                                        +---------+---------------+---------+-----------+----------+--------------+    +----+---------------+---------+-----------+----------+--------------+  LEFTCompressibilityPhasicitySpontaneityPropertiesThrombus Aging +----+---------------+---------+-----------+----------+--------------+ CFV Full           Yes      Yes                                 +----+---------------+---------+-----------+----------+--------------+     Summary: RIGHT: - There is no evidence of deep vein thrombosis in the lower extremity.  - No cystic structure found in the popliteal fossa.  LEFT: - No evidence of common femoral vein obstruction.   *See table(s) above for measurements and observations. Electronically signed by Fonda Rim on 12/07/2023 at 4:45:31 PM.    Final    DG CHEST PORT 1 VIEW Result Date: 12/07/2023 CLINICAL DATA:  Shortness of breath. EXAM: PORTABLE CHEST 1 VIEW COMPARISON:  Chest radiograph dated 12/06/2023 FINDINGS: There is cardiomegaly with vascular congestion. No focal consolidation, pleural effusion, pneumothorax. No acute osseous pathology. IMPRESSION: Cardiomegaly with vascular congestion. Electronically Signed   By: Vanetta Chou M.D.   On: 12/07/2023 13:14   DG Chest Portable 1 View Result Date: 12/06/2023 CLINICAL DATA:  Dyspnea. EXAM: PORTABLE CHEST 1 VIEW COMPARISON:  10/22/2023 FINDINGS: Stable cardiac enlargement. Asymmetric elevation of the right hemidiaphragm is identified with pleuroparenchymal scarring within the right base with chronic blunting of the right costophrenic angle. Atelectasis noted in the left base. Increase interstitial prominence with hazy lung opacities noted in the left lung. No consolidative change or pneumothorax. Visualized osseous structures are unremarkable. IMPRESSION: 1. Cardiac enlargement with increase interstitial prominence and hazy lung opacities in the left lung. Findings may reflect asymmetric edema or atypical infection. 2. Chronic asymmetric elevation of the right hemidiaphragm with pleuroparenchymal scarring in the right  base. Electronically Signed   By: Waddell Calk M.D.   On: 12/06/2023 08:16     Discharge Instructions:   Signed: Elnora Ip, MD 12/08/2023, 11:52 AM    Myrna Bitters, DO 12/08/2023, 12:45

## 2023-12-08 NOTE — Consult Note (Signed)
 NAME:  James Villegas, MRN:  [id number], DOB:  [birth year], LOS: 1 ADMISSION DATE:  12/06/2023, CONSULTATION DATE:  12/07/23 REFERRING MD:  ITMS CHIEF COMPLAINT:  AMS/Hypercapnia   History of Present Illness:  68 yo M PMH hypoxic and hypercarbic respiratory failure on 4-5L O2 and CPAP, Severe COPD, suspected OSA/OHS, obesity, prior CVA with residual R sided deficits, epilepsy, afib/flutter, Protein C S def, lupus anticoagulant, Chronic AC on warfarin who presented to ED 6/29 from his [facility] for acute on chronic hypoxemic respiratory failure. He was admitted to IMTS and started on therapy for CHF with diuresis and azithromycin /ceftriaxone  for possible community acquired pneumonia.   PCCM consulted today as patient is somnolent and VBG showing elevated pCO2 level.   Unable to obtain history from patient but he arousable to tactile stimuli. He has not been placed on bipap due to concern for aspiration.  Pertinent  Medical History   Past Medical History:  Diagnosis Date   Acute on chronic congestive heart failure (HCC)    Anemia, secondary    SECONDARY TO ACUTE BLOOD LOSS   Anxiety disorder    Bloody stool 08/29/2011   intermittent along with constipation.    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 IVC filter   Seizure disorder (HCC)     Significant  Hospital Events: Including procedures, antibiotic start and stop dates in addition to other pertinent events   6/29 admitted to hospital 6/30 PCCM consulted  Interim History / Subjective:    Patient feeling better today Tolerated bipap overnight Awake sitting up eating lunch He has CPAP/Bipap machine at home that he uses every night  Objective    Blood pressure 124/66, pulse 95, temperature 98.5 F (36.9 C), temperature source Oral, resp. rate 18, height 6' 1 (1.854 m), weight 131.5 kg, SpO2 93%.    FiO2 (%):  [40 %] 40 %   Intake/Output Summary (Last 24 hours) at 12/08/2023 1529 Last data filed at 12/08/2023 1235 Gross per 24 hour  Intake 1846.73 ml  Output 2250 ml  Net -403.27 ml   Filed Weights   12/06/23 0741 12/07/23 1150 12/08/23 0600  Weight: 136.1 kg 132.2 kg 131.5 kg    Examination: General: elderly male, chronically ill appearing, no distress HENT: [place]/AT, moist mucous membranes, sclera anicteric Lungs: diminished but clear, no wheezing Cardiovascular: rrr, no murmurs Abdomen: soft, non-tender, non-distended Extremities: warm, 1-2+ lower extremity edema Neuro: PERRL, awake, alert, right arm/leg deficit from prior cva GU: n/a  Resolved problem list   Assessment and Plan   Acute on Chronic Hypoxemic Hypercapnic Respiratory Therapy Acute on Chronic Congestive Heart Failure Obesity Hypoventilation and Obstructive Sleep Apnea  Possible aspiration pneumonia vs Healthcare Associated Pneumonia  Urinary Tract Infection Atrial Fibrillation Epilepsy  Plan: -  continue CPAP/Bipap at night or during day with naps. He has home CPAP/bipap unit. - can complete linezolid, augmentin and doxycycline  for pneumonia coverage  PCCM will sign off  Best Practice (right click and Reselect all SmartList Selections daily)   Per primary team  Labs   CBC: Recent Labs  Lab 12/06/23 0758 12/06/23 0803 12/07/23 0500 12/08/23 0324  WBC 10.9*  --  11.2* 7.4  NEUTROABS 9.0*   --   --   --   HGB 14.4 15.0 14.8 13.8  HCT 45.5 44.0 47.8 44.2  MCV 97.8  --  99.8 97.8  PLT 226  --  224 224    Basic Metabolic Panel: Recent Labs  Lab 12/06/23 0758 12/06/23 0803 12/07/23 0500 12/08/23 0324  NA 138 136 139 136  K 4.0 4.0 3.8 3.4*  CL 93*  --  93* 90*  CO2 32  --  36* 38*  GLUCOSE 132*  --  120* 123*  BUN 7*  --  8 11  CREATININE 0.58*  --  0.64 0.57*  CALCIUM  8.4*  --  8.9 8.7*   GFR: Estimated Creatinine Clearance: 130.9 mL/min (A) (by C-G formula based on SCr of 0.57 mg/dL (L)). Recent Labs  Lab 12/06/23 0758 12/06/23 0804 12/07/23 0500 12/08/23 0324  WBC 10.9*  --  11.2* 7.4  LATICACIDVEN  --  0.9  --   --     Liver Function Tests: Recent Labs  Lab 12/06/23 0758 12/07/23 0500  AST 17 14*  ALT 13 13  ALKPHOS 105 106  BILITOT 0.7 0.5  PROT 6.3* 6.6  ALBUMIN 2.9* 2.9*   No results for input(s): LIPASE, AMYLASE in the last 168 hours. No results for input(s): AMMONIA in the last 168 hours.  ABG    Component Value Date/Time   PHART 7.48 (H) 10/22/2023 1140   PCO2ART 65 (H) 10/22/2023 1140   PO2ART 123 (H) 10/22/2023 1140   HCO3 46.2 (H) 12/07/2023 2102   TCO2 42 (H) 12/06/2023 0803   O2SAT 92.7 12/07/2023 2102     Coagulation Profile: Recent Labs  Lab 12/06/23 0758 12/07/23 0500 12/08/23 0324  INR 2.1* 2.2* 2.6*    Cardiac Enzymes: No results for input(s): CKTOTAL, CKMB, CKMBINDEX, TROPONINI in the last 168 hours.  HbA1C: Hgb A1c MFr Bld  Date/Time Value Ref Range Status  12/16/2010 05:50 AM 5.4 <5.7 % Final    Comment:    (NOTE)                                                                       According to the ADA Clinical Practice Recommendations for 2011, when HbA1c is used as a screening test:  >=6.5%   Diagnostic of Diabetes Mellitus           (if abnormal result is confirmed) 5.7-6.4%   Increased risk of developing Diabetes Mellitus References:Diagnosis and Classification of Diabetes  [place],Diabetes Care,2011,34(Suppl 1):S62-S69 and Standards of Medical Care in         Diabetes - 2011,Diabetes Care,2011,34 (Suppl 1):S11-S61.  07/15/2008 01:20 PM  4.6 - 6.1 % Final   5.0 (NOTE)   The ADA recommends the following therapeutic goal for glycemic   control related to Hgb A1C measurement:  Goal of Therapy:   < 7.0% Hgb A1C   Reference: American Diabetes Association: Clinical Practice   Recommendations 2008, Diabetes Care,  2008, 31:(Suppl 1).    CBG: Recent Labs  Lab 12/06/23 1504  GLUCAP 111*    Critical care time: n/a    Dorn Chill, MD [facility] Office: [phone]   See Amion for personal pager PCCM on call pager [phone] until 7pm. Please call Elink 7p-7a. [phone]

## 2023-12-08 NOTE — NC FL2 (Signed)
 [place]  MEDICAID FL2 LEVEL OF CARE FORM     IDENTIFICATION  Patient Name: James Villegas Birthdate: March 16, 1956 Sex: male Admission Date (Current Location): 12/06/2023  [facility] and [place] Number:  Producer, television/film/video and Address:  [facility], 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) 12/06/2023   Mild protein-calorie malnutrition (HCC) 12/06/2023   Acute hypoxic respiratory failure (HCC) 10/20/2023   Acute respiratory failure with hypoxia and hypercarbia (HCC) 08/19/2022   History of cardioembolic cerebrovascular accident (CVA) 08/19/2022   Acute on chronic respiratory failure with hypoxia and hypercapnia (HCC) secondary to acute on chronic diastolic CHF exacerbation 09/16/2021   Hyponatremia 09/16/2021   History of DVT (deep vein thrombosis) 09/16/2021   GERD (gastroesophageal reflux disease) 09/16/2021   Aneurysm of ascending aorta (HCC)    Hypoxia    Acute on chronic heart failure with preserved ejection fraction (HCC) 07/30/2020   Acute on chronic diastolic CHF (congestive heart failure) (HCC) 07/30/2020   Acute on chronic respiratory failure with hypercapnia (HCC) 07/30/2020   Pressure injury of skin 02/22/2020   AKI (acute kidney injury) (HCC)    HCAP (healthcare-associated pneumonia)    Acute metabolic encephalopathy    Hypercoagulable state (HCC)     Seizure disorder (HCC)    Chronic diastolic CHF (congestive heart failure) (HCC)    Atrial fibrillation, chronic (HCC) on chronic anticoagulation  protein S and C deficiency    History of CVA with residual deficit    Hyperlipidemia    Acute respiratory failure (HCC) 02/20/2020   CAP (community acquired pneumonia) 02/20/2020   Obesity, Class III, BMI 40-49.9 (morbid obesity) 02/20/2020   Bradycardia 02/20/2020   Hyperkalemia 02/20/2020   Hypercarbia 02/20/2020   Acute on chronic congestive heart failure (HCC)    Permanent atrial fibrillation (HCC)    Respiratory failure (HCC) 08/02/2018   Anemia of other chronic disease 11/23/2013   Pain in joint, ankle and foot 11/14/2013   Other convulsions 10/19/2013   Noninfectious gastroenteritis and colitis 07/29/2013   Left upper quadrant abdominal mass 07/28/2013   Other specified disease of white blood cells 07/16/2013   Ingrowing toenail with infection 05/20/2013   Late effects of cerebrovascular disease 10/15/2012   Pure hypercholesterolemia 10/15/2012   Essential hypertension, benign 10/15/2012   Hereditary and idiopathic peripheral neuropathy 10/15/2012   Cerebral infarction (HCC) 08/29/2011   DVT (deep venous thrombosis) (HCC) 08/29/2011   Bloody stool 08/29/2011   PFO (patent foramen ovale) 02/18/2011    Orientation RESPIRATION BLADDER Height & Weight     Self, Time, Situation, Place  O2 (4L nasal cannula) Continent, External catheter Weight: 289 lb 14.5 oz (131.5 kg) Height:  6' 1 (185.4 cm)  BEHAVIORAL SYMPTOMS/MOOD NEUROLOGICAL BOWEL NUTRITION STATUS      Continent Diet (Please see discharge summary)  AMBULATORY STATUS COMMUNICATION OF NEEDS Skin     Verbally Normal  Personal Care Assistance Level of Assistance              Functional Limitations Info             SPECIAL CARE FACTORS FREQUENCY                       Contractures Contractures Info: Not present     Additional Factors Info  Code Status, Allergies, Isolation Precautions Code Status Info: Full Code Allergies Info: Fenofibrate     Isolation Precautions Info: Contact Precautions (ESBL, MRSA)     Current Medications (12/08/2023):  This is the current hospital active medication list Current Facility-Administered Medications  Medication Dose Route Frequency Provider Last Rate Last Admin   acetaminophen  (TYLENOL ) tablet 650 mg  650 mg Oral Q6H PRN Elnora Ip, MD       Or   acetaminophen  (TYLENOL ) suppository 650 mg  650 mg Rectal Q6H PRN Elnora Ip, MD       budesonide  (PULMICORT ) nebulizer solution 0.5 mg  0.5 mg Nebulization BID Kara Carrier B, MD   0.5 mg at 12/08/23 9166   empagliflozin  (JARDIANCE ) tablet 10 mg  10 mg Oral Daily Koomson, Julius, MD   10 mg at 12/08/23 1133   ipratropium-albuterol  (DUONEB) 0.5-2.5 (3) MG/3ML nebulizer solution 3 mL  3 mL Nebulization Q6H Dewald, Jonathan B, MD   3 mL at 12/08/23 0833   linezolid (ZYVOX) IVPB 600 mg  600 mg Intravenous Q12H Kara Carrier B, MD 300 mL/hr at 12/08/23 1132 600 mg at 12/08/23 1132   meropenem (MERREM) 1 g in sodium chloride  0.9 % 100 mL IVPB  1 g Intravenous Q8H Reome, Earle J, RPH 200 mL/hr at 12/08/23 9378 1 g at 12/08/23 9378   pantoprazole  (PROTONIX ) EC tablet 40 mg  40 mg Oral Daily Koomson, Julius, MD   40 mg at 12/08/23 1133   phenytoin  (DILANTIN ) chewable tablet 50 mg  50 mg Oral QHS Koomson, Julius, MD   50 mg at 12/07/23 2158   phenytoin  (DILANTIN ) ER capsule 200 mg  200 mg Oral BID Koomson, Julius, MD   200 mg at 12/08/23 1133   senna-docusate (Senokot-S) tablet 1 tablet  1 tablet Oral QHS PRN Elnora Ip, MD       sodium chloride  flush (NS) 0.9 % injection 3 mL  3 mL Intravenous Q12H Elnora Ip, MD   3 mL at 12/07/23 2159   spironolactone  (ALDACTONE ) tablet 25 mg  25 mg Oral Daily Koomson, Julius, MD   25 mg at 12/08/23 1133   torsemide  (DEMADEX ) tablet 40 mg   40 mg Oral BID Gomez-Caraballo, Maria, MD   40 mg at 12/08/23 1133   traZODone  (DESYREL ) tablet 25 mg  25 mg Oral QHS Koomson, Julius, MD   25 mg at 12/06/23 2236   warfarin (COUMADIN ) tablet 2 mg  2 mg Oral q1600 Francesco Elsie NOVAK, MD       Warfarin - Pharmacist Dosing Inpatient   Does not apply q1600 Gaines Carrier, [facility]   Given at 12/07/23 1736     Discharge Medications: Please see discharge summary for a list of discharge medications.  Relevant Imaging Results:  Relevant Lab Results:   Additional Information SS# [id number]  Lauraine FORBES Saa, LCSW

## 2023-12-08 NOTE — Progress Notes (Signed)
 PHARMACY - ANTICOAGULATION CONSULT NOTE  Pharmacy Consult for warfarin Indication: atrial fibrillation  Allergies  Allergen Reactions   Fenofibrate Other (See Comments)    [facility]    Patient Measurements: Height: 6' 1 (185.4 cm) Weight: 131.5 kg (289 lb 14.5 oz) IBW/kg (Calculated) : 79.9 HEPARIN  DW (KG): 109.6  Vital Signs: Temp: 98.7 F (37.1 C) (07/01 0724) Temp Source: Oral (07/01 0724) BP: 112/66 (07/01 0724) Pulse Rate: 87 (07/01 0724)  Labs: Recent Labs    12/06/23 0758 12/06/23 0803 12/06/23 0936 12/07/23 0500 12/08/23 0324  HGB 14.4 15.0  --  14.8 13.8  HCT 45.5 44.0  --  47.8 44.2  PLT 226  --   --  224 224  LABPROT 24.5*  --   --  25.7* 28.7*  INR 2.1*  --   --  2.2* 2.6*  CREATININE 0.58*  --   --  0.64 0.57*  TROPONINIHS 45*  --  31*  --   --     Estimated Creatinine Clearance: 130.9 mL/min (A) (by C-G formula based on SCr of 0.57 mg/dL (L)).   Medical History: Past Medical History:  Diagnosis Date   Acute on chronic congestive heart failure (HCC)    Anemia, secondary    SECONDARY TO ACUTE BLOOD LOSS   Anxiety disorder    Bloody stool 08/29/2011   intermittent along with constipation.    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 IVC filter   Seizure disorder [facility])     Assessment: 16 YOM presenting with  SOB, hx of afib on warfarin PTA with last dose taken 6/28. PTA dosing: 3.5 mg daily  Of note, patient currently ordered azithromycin  inpatient. Concurrent use of azithromycin  and warfarin may result in an increased INR.  NR therapeutic at 2.6. Will continue home dose with close monitoring.  Goal of Therapy:  INR 2-3 Monitor platelets by anticoagulation protocol: Yes   Plan: Give warfarin 2 mg PO daily Check INR daily while on warfarin Continue to monitor H&H and platelets   Thank you for allowing pharmacy to be a part of this patient's care.  Shelba Collier, PharmD, BCPS Clinical Pharmacist

## 2023-12-08 NOTE — TOC Transition Note (Signed)
 Transition of Care [facility]) - Discharge Note   Patient Details  Name: James Villegas MRN: [id number] Date of Birth: 15-Jun-1955  [facility]) CM/SW Contact:  Lauraine FORBES Saa, LCSW Phone Number: 12/08/2023, 12:52 PM   Clinical Narrative:     Patient will DC to: [facility] SNF LTC Anticipated DC date: 12/08/2023 Family notified: Devere Simpers; Sister; [phone] Transport by: [facility]   Per MD patient ready for DC to [facility] SN[facility] LTC. RN to call report prior to discharge [phone]). RN, patient, patient's family, and facility notified of DC. Discharge Summary and FL2 sent to facility. DC packet on chart. Ambulance transport requested for patient at 12:47.  CSW will sign off for now as social work intervention is no longer needed. Please consult us  again if new needs arise.    Final next level of care: Skilled Nursing Facility Barriers to Discharge: Barriers Resolved   Patient Goals and CMS Choice Patient states their goals for this hospitalization and ongoing recovery are:: to return to [facility] LTC   Choice offered to / list presented to : NA      Discharge Placement              Patient chooses bed at: [facility] Patient to be transferred to facility by: [facility] Name of family member notified: Devere Simpers; Sister; [phone] Patient and family notified of of transfer: 12/08/23  Discharge Plan and Services Additional resources added to the After Visit Summary for   In-house Referral: Clinical Social Work   Post Acute Care Choice: Skilled Nursing Facility                               Social Drivers of Health (SDOH) Interventions SDOH Screenings   Food Insecurity: Patient Unable To Answer (12/07/2023)  Housing: Patient Unable To Answer (12/07/2023)  Transportation Needs: Patient Unable To Answer (12/07/2023)  Utilities: Patient Unable To Answer (12/07/2023)  Social Connections: Moderately Integrated (12/08/2023)  Tobacco Use: Medium Risk  (12/06/2023)     Readmission Risk Interventions    08/26/2022   11:56 AM  Readmission Risk Prevention Plan  Transportation Screening Complete  PCP or Specialist Appt within 5-7 Days Complete  Home Care Screening Complete  Medication Review (RN CM) Referral to Pharmacy

## 2023-12-10 ENCOUNTER — Other Ambulatory Visit: Payer: Self-pay

## 2023-12-10 ENCOUNTER — Observation Stay (HOSPITAL_COMMUNITY)
Admission: EM | Admit: 2023-12-10 | Discharge: 2023-12-11 | Disposition: A | Discharge status: Skilled Nursing Facility | Attending: Emergency Medicine | Admitting: Emergency Medicine

## 2023-12-10 ENCOUNTER — Encounter (HOSPITAL_COMMUNITY): Payer: Self-pay | Admitting: Emergency Medicine

## 2023-12-10 ENCOUNTER — Emergency Department (HOSPITAL_COMMUNITY)

## 2023-12-10 DIAGNOSIS — J9621 Acute and chronic respiratory failure with hypoxia: Secondary | ICD-10-CM | POA: Diagnosis not present

## 2023-12-10 DIAGNOSIS — R0603 Acute respiratory distress: Secondary | ICD-10-CM | POA: Diagnosis not present

## 2023-12-10 DIAGNOSIS — J9611 Chronic respiratory failure with hypoxia: Secondary | ICD-10-CM | POA: Insufficient documentation

## 2023-12-10 DIAGNOSIS — G40909 Epilepsy, unspecified, not intractable, without status epilepticus: Secondary | ICD-10-CM | POA: Diagnosis not present

## 2023-12-10 DIAGNOSIS — I482 Chronic atrial fibrillation, unspecified: Secondary | ICD-10-CM | POA: Diagnosis not present

## 2023-12-10 DIAGNOSIS — I509 Heart failure, unspecified: Secondary | ICD-10-CM

## 2023-12-10 DIAGNOSIS — I10 Essential (primary) hypertension: Secondary | ICD-10-CM | POA: Diagnosis present

## 2023-12-10 DIAGNOSIS — J9602 Acute respiratory failure with hypercapnia: Secondary | ICD-10-CM | POA: Insufficient documentation

## 2023-12-10 DIAGNOSIS — J189 Pneumonia, unspecified organism: Secondary | ICD-10-CM | POA: Diagnosis not present

## 2023-12-10 DIAGNOSIS — I69951 Hemiplegia and hemiparesis following unspecified cerebrovascular disease affecting right dominant side: Secondary | ICD-10-CM

## 2023-12-10 DIAGNOSIS — Z79899 Other long term (current) drug therapy: Secondary | ICD-10-CM | POA: Insufficient documentation

## 2023-12-10 DIAGNOSIS — I5030 Unspecified diastolic (congestive) heart failure: Secondary | ICD-10-CM | POA: Diagnosis not present

## 2023-12-10 DIAGNOSIS — R0902 Hypoxemia: Secondary | ICD-10-CM | POA: Diagnosis not present

## 2023-12-10 DIAGNOSIS — I110 Hypertensive heart disease with heart failure: Secondary | ICD-10-CM | POA: Diagnosis not present

## 2023-12-10 DIAGNOSIS — I69351 Hemiplegia and hemiparesis following cerebral infarction affecting right dominant side: Secondary | ICD-10-CM | POA: Insufficient documentation

## 2023-12-10 DIAGNOSIS — D6862 Lupus anticoagulant syndrome: Secondary | ICD-10-CM | POA: Diagnosis not present

## 2023-12-10 DIAGNOSIS — R0602 Shortness of breath: Secondary | ICD-10-CM | POA: Diagnosis present

## 2023-12-10 DIAGNOSIS — J9601 Acute respiratory failure with hypoxia: Secondary | ICD-10-CM | POA: Diagnosis present

## 2023-12-10 LAB — CBC WITH DIFFERENTIAL/PLATELET
Abs Immature Granulocytes: 0.09 10*3/uL — ABNORMAL HIGH (ref 0.00–0.07)
Basophils Absolute: 0.1 10*3/uL (ref 0.0–0.1)
Basophils Relative: 1 %
Eosinophils Absolute: 0.2 10*3/uL (ref 0.0–0.5)
Eosinophils Relative: 3 %
HCT: 44.4 % (ref 39.0–52.0)
Hemoglobin: 13.9 g/dL (ref 13.0–17.0)
Immature Granulocytes: 1 %
Lymphocytes Relative: 6 %
Lymphs Abs: 0.5 10*3/uL — ABNORMAL LOW (ref 0.7–4.0)
MCH: 31 pg (ref 26.0–34.0)
MCHC: 31.3 g/dL (ref 30.0–36.0)
MCV: 99.1 fL (ref 80.0–100.0)
Monocytes Absolute: 0.7 10*3/uL (ref 0.1–1.0)
Monocytes Relative: 8 %
Neutro Abs: 7.3 10*3/uL (ref 1.7–7.7)
Neutrophils Relative %: 81 %
Platelets: 240 10*3/uL (ref 150–400)
RBC: 4.48 MIL/uL (ref 4.22–5.81)
RDW: 14.2 % (ref 11.5–15.5)
WBC: 8.8 10*3/uL (ref 4.0–10.5)
nRBC: 0 % (ref 0.0–0.2)

## 2023-12-10 LAB — I-STAT VENOUS BLOOD GAS, ED
Acid-Base Excess: 14 mmol/L — ABNORMAL HIGH (ref 0.0–2.0)
Acid-Base Excess: 16 mmol/L — ABNORMAL HIGH (ref 0.0–2.0)
Bicarbonate: 42.9 mmol/L — ABNORMAL HIGH (ref 20.0–28.0)
Bicarbonate: 45.4 mmol/L — ABNORMAL HIGH (ref 20.0–28.0)
Calcium, Ion: 1.09 mmol/L — ABNORMAL LOW (ref 1.15–1.40)
Calcium, Ion: 1.09 mmol/L — ABNORMAL LOW (ref 1.15–1.40)
HCT: 42 % (ref 39.0–52.0)
HCT: 40 % (ref 39.0–52.0)
Hemoglobin: 14.3 g/dL (ref 13.0–17.0)
Hemoglobin: 13.6 g/dL (ref 13.0–17.0)
O2 Saturation: 100 %
O2 Saturation: 100 %
Potassium: 3.8 mmol/L (ref 3.5–5.1)
Potassium: 4 mmol/L (ref 3.5–5.1)
Sodium: 140 mmol/L (ref 135–145)
Sodium: 140 mmol/L (ref 135–145)
TCO2: 45 mmol/L — ABNORMAL HIGH (ref 22–32)
TCO2: 48 mmol/L — ABNORMAL HIGH (ref 22–32)
pCO2, Ven: 71.7 mmHg (ref 44–60)
pCO2, Ven: 75.9 mmHg (ref 44–60)
pH, Ven: 7.385 (ref 7.25–7.43)
pH, Ven: 7.385 (ref 7.25–7.43)
pO2, Ven: 196 mmHg — ABNORMAL HIGH (ref 32–45)
pO2, Ven: 225 mmHg — ABNORMAL HIGH (ref 32–45)

## 2023-12-10 LAB — COMPREHENSIVE METABOLIC PANEL WITH GFR
ALT: 13 U/L (ref 0–44)
AST: 20 U/L (ref 15–41)
Albumin: 2.8 g/dL — ABNORMAL LOW (ref 3.5–5.0)
Alkaline Phosphatase: 101 U/L (ref 38–126)
Anion gap: 10 (ref 5–15)
BUN: 7 mg/dL — ABNORMAL LOW (ref 8–23)
CO2: 35 mmol/L — ABNORMAL HIGH (ref 22–32)
Calcium: 8.6 mg/dL — ABNORMAL LOW (ref 8.9–10.3)
Chloride: 98 mmol/L (ref 98–111)
Creatinine, Ser: 0.45 mg/dL — ABNORMAL LOW (ref 0.61–1.24)
GFR, Estimated: >60 mL/min (ref >60)
Glucose, Bld: 90 mg/dL (ref 70–99)
Potassium: 4 mmol/L (ref 3.5–5.1)
Sodium: 143 mmol/L (ref 135–145)
Total Bilirubin: 0.5 mg/dL (ref 0.0–1.2)
Total Protein: 6.1 g/dL — ABNORMAL LOW (ref 6.5–8.1)

## 2023-12-10 LAB — BLOOD GAS, VENOUS
Acid-Base Excess: 18.4 mmol/L — ABNORMAL HIGH (ref 0.0–2.0)
Bicarbonate: 47.8 mmol/L — ABNORMAL HIGH (ref 20.0–28.0)
O2 Saturation: 95 %
Patient temperature: 37
pCO2, Ven: 79 mmHg (ref 44–60)
pH, Ven: 7.39 (ref 7.25–7.43)
pO2, Ven: 64 mmHg — ABNORMAL HIGH (ref 32–45)

## 2023-12-10 LAB — PROTIME-INR
INR: 1.9 — ABNORMAL HIGH (ref 0.8–1.2)
Prothrombin Time: 23 s — ABNORMAL HIGH (ref 11.4–15.2)

## 2023-12-10 LAB — TROPONIN I (HIGH SENSITIVITY)
Troponin I (High Sensitivity): 29 ng/L — ABNORMAL HIGH (ref <18)
Troponin I (High Sensitivity): 32 ng/L — ABNORMAL HIGH (ref <18)

## 2023-12-10 LAB — MAGNESIUM: Magnesium: 2.2 mg/dL (ref 1.7–2.4)

## 2023-12-10 LAB — CBG MONITORING, ED: Glucose-Capillary: 92 mg/dL (ref 70–99)

## 2023-12-10 LAB — BRAIN NATRIURETIC PEPTIDE: B Natriuretic Peptide: 204.3 pg/mL — ABNORMAL HIGH (ref 0.0–100.0)

## 2023-12-10 MED ORDER — EZETIMIBE 10 MG PO TABS
10.0000 mg | ORAL_TABLET | Freq: Every day | ORAL | Status: DC
  Administered 2023-12-11: 10 mg via ORAL
  Filled 2023-12-10: qty 1

## 2023-12-10 MED ORDER — DOXYCYCLINE HYCLATE 100 MG PO TABS
100.0000 mg | ORAL_TABLET | Freq: Two times a day (BID) | ORAL | Status: DC
  Administered 2023-12-10 – 2023-12-11 (×2): 100 mg via ORAL
  Filled 2023-12-10 (×2): qty 1

## 2023-12-10 MED ORDER — CARBOXYMETH-GLYCERIN-POLYSORB 0.5-1-0.5 % OP SOLN: 1.0000 [drp] | Freq: Four times a day (QID) | OPHTHALMIC | Status: DC

## 2023-12-10 MED ORDER — WARFARIN - PHARMACIST DOSING INPATIENT: Freq: Every day | Status: DC

## 2023-12-10 MED ORDER — SENNOSIDES-DOCUSATE SODIUM 8.6-50 MG PO TABS: 1.0000 | ORAL_TABLET | Freq: Every evening | ORAL | Status: DC | PRN

## 2023-12-10 MED ORDER — DULOXETINE HCL 30 MG PO CPEP
30.0000 mg | ORAL_CAPSULE | Freq: Every morning | ORAL | Status: DC
  Administered 2023-12-11: 30 mg via ORAL
  Filled 2023-12-10: qty 1

## 2023-12-10 MED ORDER — POLYVINYL ALCOHOL 1.4 % OP SOLN
1.0000 [drp] | Freq: Four times a day (QID) | OPHTHALMIC | Status: DC
  Administered 2023-12-10 – 2023-12-11 (×2): 1 [drp] via OPHTHALMIC
  Filled 2023-12-10: qty 15

## 2023-12-10 MED ORDER — CARBOXYMETH-GLYCERIN-POLYSORB 0.5-1-0.5 % OP SOLN: 1.0000 [drp] | Freq: Two times a day (BID) | OPHTHALMIC | Status: DC

## 2023-12-10 MED ORDER — EMPAGLIFLOZIN 10 MG PO TABS
10.0000 mg | ORAL_TABLET | Freq: Every day | ORAL | Status: DC
  Administered 2023-12-11: 10 mg via ORAL
  Filled 2023-12-10: qty 1

## 2023-12-10 MED ORDER — PANTOPRAZOLE SODIUM 40 MG PO TBEC
40.0000 mg | DELAYED_RELEASE_TABLET | Freq: Every day | ORAL | Status: DC
  Administered 2023-12-11: 40 mg via ORAL
  Filled 2023-12-10: qty 1

## 2023-12-10 MED ORDER — GUAIFENESIN 100 MG/5ML PO LIQD: 15.0000 mL | ORAL | Status: DC | PRN

## 2023-12-10 MED ORDER — ACETAMINOPHEN 325 MG PO TABS: 650.0000 mg | ORAL_TABLET | Freq: Four times a day (QID) | ORAL | Status: DC | PRN

## 2023-12-10 MED ORDER — WARFARIN SODIUM 1 MG PO TABS
1.5000 mg | ORAL_TABLET | Freq: Once | ORAL | Status: AC
  Administered 2023-12-10: 1.5 mg via ORAL
  Filled 2023-12-10: qty 1

## 2023-12-10 MED ORDER — IPRATROPIUM-ALBUTEROL 0.5-2.5 (3) MG/3ML IN SOLN: 3.0000 mL | RESPIRATORY_TRACT | Status: DC | PRN

## 2023-12-10 MED ORDER — SODIUM CHLORIDE 0.9% FLUSH
3.0000 mL | Freq: Two times a day (BID) | INTRAVENOUS | Status: DC
  Administered 2023-12-10 – 2023-12-11 (×3): 3 mL via INTRAVENOUS

## 2023-12-10 MED ORDER — AMOXICILLIN-POT CLAVULANATE 875-125 MG PO TABS
1.0000 | ORAL_TABLET | Freq: Two times a day (BID) | ORAL | Status: DC
  Administered 2023-12-10 – 2023-12-11 (×2): 1 via ORAL
  Filled 2023-12-10 (×3): qty 1

## 2023-12-10 MED ORDER — ACETAMINOPHEN 650 MG RE SUPP: 650.0000 mg | Freq: Four times a day (QID) | RECTAL | Status: DC | PRN

## 2023-12-10 MED ORDER — CYCLOSPORINE 0.05 % OP EMUL
1.0000 [drp] | Freq: Two times a day (BID) | OPHTHALMIC | Status: DC
  Administered 2023-12-10 – 2023-12-11 (×2): 1 [drp] via OPHTHALMIC
  Filled 2023-12-10 (×3): qty 30

## 2023-12-10 MED ORDER — LINEZOLID 600 MG PO TABS
600.0000 mg | ORAL_TABLET | Freq: Two times a day (BID) | ORAL | Status: DC
  Administered 2023-12-10 – 2023-12-11 (×2): 600 mg via ORAL
  Filled 2023-12-10 (×3): qty 1

## 2023-12-10 MED ORDER — FLUTICASONE FUROATE-VILANTEROL 200-25 MCG/ACT IN AEPB
1.0000 | INHALATION_SPRAY | Freq: Every day | RESPIRATORY_TRACT | Status: DC
  Administered 2023-12-11: 1 via RESPIRATORY_TRACT
  Filled 2023-12-10: qty 28

## 2023-12-10 MED ORDER — TRAZODONE HCL 50 MG PO TABS
25.0000 mg | ORAL_TABLET | Freq: Every day | ORAL | Status: DC
  Administered 2023-12-10: 25 mg via ORAL
  Filled 2023-12-10: qty 1

## 2023-12-10 MED ORDER — PHENYTOIN 50 MG PO CHEW
50.0000 mg | CHEWABLE_TABLET | Freq: Every day | ORAL | Status: DC
  Administered 2023-12-10: 50 mg via ORAL
  Filled 2023-12-10 (×2): qty 1

## 2023-12-10 MED ORDER — FUROSEMIDE 10 MG/ML IJ SOLN
20.0000 mg | Freq: Once | INTRAMUSCULAR | Status: AC
  Administered 2023-12-10: 20 mg via INTRAVENOUS
  Filled 2023-12-10: qty 2

## 2023-12-10 MED ORDER — ROSUVASTATIN CALCIUM 20 MG PO TABS
40.0000 mg | ORAL_TABLET | Freq: Every day | ORAL | Status: DC
  Administered 2023-12-10: 40 mg via ORAL
  Filled 2023-12-10: qty 2

## 2023-12-10 MED ORDER — PHENYTOIN SODIUM EXTENDED 100 MG PO CAPS
200.0000 mg | ORAL_CAPSULE | Freq: Two times a day (BID) | ORAL | Status: DC
  Administered 2023-12-10 – 2023-12-11 (×2): 200 mg via ORAL
  Filled 2023-12-10 (×3): qty 2

## 2023-12-10 NOTE — ED Provider Notes (Signed)
 [place] EMERGENCY DEPARTMENT AT [facility] Provider Note   CSN: [id number] Arrival date & time: 12/10/23  1200     Patient presents with: Respiratory Distress   James Villegas is a 68 y.o. male.   HPI   68 year old male presents emergency department with respiratory distress, arrives on CPAP.  Patient from [facility] facility, call was for concern for shortness of breath.  Received DuoNebs prior to arrival.  Of note there is questionable compliance with diuretics.  Recent admission and discharge about 2 days ago for similar complaints.  Patient is alert, answers questions and follows command.  Difficult history secondary to CPAP.  Patient transitioned to her BiPAP on arrival with tachypnea.  Prior to Admission medications   Medication Sig Start Date End Date Taking? Authorizing Provider  acetaminophen  (TYLENOL ) 325 MG tablet Take 650 mg by mouth every 6 (six) hours as needed for mild pain, headache or fever.    [provider]  amoxicillin-clavulanate (AUGMENTIN) 875-125 MG tablet Take 1 tablet by mouth 2 (two) times daily for 3 days. 12/08/23 12/11/23  Elnora Ip, MD  antiseptic oral rinse (BIOTENE) LIQD 1 Application by Mouth Rinse route at bedtime.    [provider]  Baclofen  5 MG TABS Take 5 mg by mouth at bedtime. 02/19/23   [provider]  bisacodyl  (DULCOLAX) 5 MG EC tablet Take 10 mg by mouth daily as needed for moderate constipation.    [provider]  Carboxymeth-Glycerin -Polysorb (REFRESH OPTIVE ADVANCED) 0.5-1-0.5 % SOLN Place 1 drop into both eyes in the morning, at noon, in the evening, and at bedtime. Wait 3-5 minutes between eye medications    [provider]  cycloSPORINE  (RESTASIS ) 0.05 % ophthalmic emulsion Place 1 drop into both eyes 2 (two) times daily.    [provider]  doxycycline  (VIBRA -TABS) 100 MG tablet Take 1 tablet (100 mg total) by mouth 2 (two) times daily for 3 days. 12/08/23  12/11/23  Elnora Ip, MD  DULoxetine  (CYMBALTA ) 30 MG capsule Take 30 mg by mouth in the morning. 08/31/23   [provider]  empagliflozin  (JARDIANCE ) 10 MG TABS tablet Take by mouth daily.    [provider]  ezetimibe  (ZETIA ) 10 MG tablet Take 10 mg by mouth daily.    [provider]  fluticasone -salmeterol (ADVAIR) 250-50 MCG/ACT AEPB Inhale 1 puff into the lungs in the morning and at bedtime.    [provider]  guaiFENesin (ROBITUSSIN) 100 MG/5ML liquid Take 15 mLs by mouth every 4 (four) hours as needed for cough.    [provider]  ipratropium-albuterol  (DUONEB) 0.5-2.5 (3) MG/3ML SOLN Take 3 mLs by nebulization every 4 (four) hours as needed. 10/23/23   Elnora Ip, MD  linezolid (ZYVOX) 600 MG tablet Take 1 tablet (600 mg total) by mouth 2 (two) times daily for 4 days. 12/08/23 12/12/23  Elnora Ip, MD  losartan (COZAAR) 25 MG tablet Take 12.5 mg by mouth daily. Patient not taking: Reported on 12/06/2023    [provider]  omeprazole  (PRILOSEC) 20 MG capsule Take 20 mg by mouth every other day. 08/01/21   [provider]  OXYGEN Inhale 4 L into the lungs continuous.    [provider]  phenytoin  (DILANTIN  INFATABS) 50 MG tablet Chew 50 mg by mouth at bedtime. Give with 200mg  capsules for total dose of 250mg  qhs    [provider]  phenytoin  (DILANTIN ) 100 MG ER capsule Take 200 mg by mouth 2 (two) times  daily.    [provider]  polyvinyl alcohol  (LIQUIFILM TEARS) 1.4 % ophthalmic solution Place 1 drop into both eyes 4 (four) times daily. 10/23/23   Elnora Ip, MD  rosuvastatin  (CRESTOR ) 40 MG tablet Take 40 mg by mouth at bedtime.    [provider]  spironolactone  (ALDACTONE ) 25 MG tablet Take 1 tablet (25 mg total) by mouth daily. 09/22/21 12/06/23  Willette Adriana LABOR, MD  torsemide  (DEMADEX ) 20 MG tablet Take 40 mg by mouth in the morning and at  bedtime. 08/31/23   [provider]  traZODone  (DESYREL ) 50 MG tablet Take 25 mg by mouth at bedtime. 08/01/21   [provider]  warfarin (COUMADIN ) 2 MG tablet Take 1 tablet (2 mg total) by mouth daily at 4 PM. 12/08/23   Elnora Ip, MD    Allergies: Fenofibrate    Review of Systems  Unable to perform ROS: Acuity of condition    Updated Vital Signs BP 112/76   Pulse 93   Temp 98.4 F (36.9 C) (Tympanic)   Resp 14   Ht 6' 1 (1.854 m)   Wt 131.5 kg   SpO2 99%   BMI 38.25 kg/m   Physical Exam Vitals and nursing note reviewed.  Constitutional:      General: He is in acute distress.     Appearance: He is obese. He is ill-appearing.  HENT:     Head: Normocephalic.     Mouth/Throat:     Mouth: Mucous membranes are dry.  Eyes:     Extraocular Movements: Extraocular movements intact.     Pupils: Pupils are equal, round, and reactive to light.  Cardiovascular:     Rate and Rhythm: Normal rate.  Pulmonary:     Effort: Respiratory distress present.     Breath sounds: Rales present.  Abdominal:     Palpations: Abdomen is soft.     Tenderness: There is no abdominal tenderness.  Musculoskeletal:        General: Swelling present.  Skin:    General: Skin is warm.  Neurological:     Mental Status: He is alert.     Comments: Hemiplegic on the right, baseline.  Following commands, responding to questions but difficult to understand secondary to BiPAP     (all labs ordered are listed, but only abnormal results are displayed) Labs Reviewed  COMPREHENSIVE METABOLIC PANEL WITH GFR - Abnormal; Notable for the following components:      Result Value   CO2 35 (*)    BUN 7 (*)    Creatinine, Ser 0.45 (*)    Calcium  8.6 (*)    Total Protein 6.1 (*)    Albumin 2.8 (*)    All other components within normal limits  PROTIME-INR - Abnormal; Notable for the following components:   Prothrombin Time 23.0 (*)    INR 1.9 (*)    All other components within  normal limits  BRAIN NATRIURETIC PEPTIDE - Abnormal; Notable for the following components:   B Natriuretic Peptide 204.3 (*)    All other components within normal limits  CBC WITH DIFFERENTIAL/PLATELET - Abnormal; Notable for the following components:   Lymphs Abs 0.5 (*)    Abs Immature Granulocytes 0.09 (*)    All other components within normal limits  I-STAT VENOUS BLOOD GAS, ED - Abnormal; Notable for the following components:   pCO2, Ven 71.7 (*)    pO2, Ven 196 (*)    Bicarbonate 42.9 (*)    TCO2 45 (*)  Acid-Base Excess 14.0 (*)    Calcium , Ion 1.09 (*)    All other components within normal limits  TROPONIN I (HIGH SENSITIVITY) - Abnormal; Notable for the following components:   Troponin I (High Sensitivity) 29 (*)    All other components within normal limits  MAGNESIUM   CBC WITH DIFFERENTIAL/PLATELET  CBG MONITORING, ED  I-STAT VENOUS BLOOD GAS, ED  TROPONIN I (HIGH SENSITIVITY)    EKG: None  Radiology: DG Chest Port 1 View Result Date: 12/10/2023 CLINICAL DATA:  Short of breath EXAM: PORTABLE CHEST 1 VIEW COMPARISON:  Prior chest x-ray 12/07/2023 FINDINGS: Significant enlargement of the cardiopericardial silhouette. Diffuse pulmonary vascular congestion with mild pulmonary edema. Small to moderate layering right pleural effusion. Left basilar atelectasis. No pneumothorax. IMPRESSION: 1. Mild CHF. 2. Mild to moderate right pleural effusion with chronic elevation of the right hemidiaphragm. Electronically Signed   By: Wilkie Lent M.D.   On: 12/10/2023 13:29     .Critical Care  Performed by: Bari Roxie HERO, DO Authorized by: Bari Roxie HERO, DO   Critical care provider statement:    Critical care time (minutes):  30   Critical care time was exclusive of:  Separately billable procedures and treating other patients   Critical care was necessary to treat or prevent imminent or life-threatening deterioration of the following conditions:  Respiratory failure    Critical care was time spent personally by me on the following activities:  Development of treatment plan with patient or surrogate, discussions with consultants, evaluation of patient's response to treatment, examination of patient, ordering and review of laboratory studies, ordering and review of radiographic studies, ordering and performing treatments and interventions, pulse oximetry, re-evaluation of patient's condition and review of old charts   I assumed direction of critical care for this patient from another provider in my specialty: no     Care discussed with: admitting provider      Medications Ordered in the ED  furosemide  (LASIX ) injection 20 mg (20 mg Intravenous Given 12/10/23 1224)                                    Medical Decision Making Amount and/or Complexity of Data Reviewed Labs: ordered. Radiology: ordered.  Risk Prescription drug management. Decision regarding hospitalization.   68 year old male presents emergency department respiratory distress.  Was given DuoNebs and placed on CPAP prior to arrival with concern for respiratory distress.  On arrival patient continues to be tachypneic, diminished breath sounds, transition to her BiPAP.  He is awake and alert, following commands.  Otherwise normal vitals.  Chest x-ray shows findings of failure with a pleural effusion.  Blood work shows no leukocytosis, chemistry is essentially unremarkable.  BNP is slightly elevated and troponins are elevated, baseline with no delta.  VBG shows hypercarbia, slightly above his baseline.  INR is 1.9, lower suspicion for PE at this time.  He continues with BiPAP and respirations have improved greatly.  On reevaluation patient continues to be responsive.  Repeat VBG will be drawn for evaluation of hypercarbia.  We will keep him on BiPAP until these results.  Patient notified that he will be admitted.  Patients evaluation and results requires admission for further treatment and care.   Spoke with hospitalist, reviewed patient's ED course and they accept admission.  Patient agrees with admission plan, offers no new complaints and is stable/unchanged at time of admit.     Final  diagnoses:  Respiratory distress  Hypoxia  Congestive heart failure, unspecified HF chronicity, unspecified heart failure type [facility])    ED Discharge Orders     None          Bari Roxie HERO, DO 12/10/23 1528

## 2023-12-10 NOTE — ED Notes (Signed)
 CCMD called.

## 2023-12-10 NOTE — ED Triage Notes (Signed)
 Pt BIB [facility] from [facility] due to respiratory distress.  Pt breathing 32 times a min.  Hx A-fib and CHF.  Pt was here yesterday for fluid overload and released.  Pt given 2 duonebs.  No right lung sounds per EMS.  Pt non-compliant with fluid restriction and c-pap at night.  Pt also complaining of nausea and vomiting.  Pt normally on 4L oxygen at baseline; SpO2 88%.  18g left wrist. VS BP 132/64

## 2023-12-10 NOTE — Progress Notes (Signed)
 PHARMACY - ANTICOAGULATION CONSULT NOTE  Pharmacy Consult for warfarin continuation from outpatient Indication: atrial fibrillation  Allergies  Allergen Reactions   Fenofibrate Other (See Comments)    Per [facility]    Patient Measurements: Height: 6' 1 (185.4 cm) Weight: 131.5 kg (289 lb 14.5 oz) IBW/kg (Calculated) : 79.9 HEPARIN  DW (KG): 109.4  Vital Signs: Temp: 98.4 F (36.9 C) (07/03 1453) Temp Source: Tympanic (07/03 1453) BP: 112/76 (07/03 1422) Pulse Rate: 93 (07/03 1422)  Labs: Recent Labs    12/08/23 0324 12/10/23 1218 12/10/23 1243 12/10/23 1423 12/10/23 1551  HGB 13.8 13.9 14.3  --  13.6  HCT 44.2 44.4 42.0  --  40.0  PLT 224 240  --   --   --   LABPROT 28.7* 23.0*  --   --   --   INR 2.6* 1.9*  --   --   --   CREATININE 0.57* 0.45*  --   --   --   TROPONINIHS  --  29*  --  32*  --     Estimated Creatinine Clearance: 130.9 mL/min (A) (by C-G formula based on SCr of 0.45 mg/dL (L)).   Medical History: Past Medical History:  Diagnosis Date   Acute on chronic congestive heart failure (HCC)    Anemia, secondary    SECONDARY TO ACUTE BLOOD LOSS   Anxiety disorder    Bloody stool 08/29/2011   intermittent along with constipation.    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 IVC filter   Seizure disorder (HCC)      Medications:  Warfarin 2mg  PO at 1600 daily  Assessment: 67 YOM w/ PMHx previous CVA, Afib, CHF presents to ED. Pt had warfarin dose reduced to 2mg  daily at previous discharge from [facility] after his discharge 7/1. Per pt report, he took his dose of warfarin this morning PTA but does not remember if he took it yesteraday. His INR is currently 1.9. CBC WNL.   Goal of Therapy:  INR 2-3 Monitor platelets by anticoagulation protocol: Yes   Plan:  Give 1.5mg  warfarin PO once tonight 7/3 at 2000 F/u INR with tomorrow morning's labs Monitor CBC, INR, s/sx of bleeding.   Belvie Macintosh, PharmD Candidate 12/10/2023,4:47 PM

## 2023-12-10 NOTE — H&P (Signed)
 Date: 12/10/2023               Patient Name:  James Villegas MRN: [id number]  DOB: [birth year] Age / Sex: 68 y.o., male   PCP: Feliciano Devoria LABOR, MD         Medical Service: Internal Medicine Teaching Service         Attending Physician: Dr. MICAEL Riis Winfrey      First Contact: Schuyler Novak, DO    Second Contact: Dr. Hadassah Kristy Ahr, MD          Pager Information: First Contact Pager: [phone]   Second Contact Pager: [phone]   SUBJECTIVE   Chief Complaint: Confusion  History of Present Illness: James Villegas is a 68 y.o. male with PMH of L CVA with hemiplegia, afib, HFpEF, chronic respiratory failure, hypercarbia, and lupus anticoagulant. The patient is a resident of [facility] rehab and was found this morning in a acutely encephalopathic state and taken to [facility] by EMS for care. Upon our conversation he states that he had one episode of emesis this morning and that the nurses were telling him his oxygen was low. He says that he wasn't feel too bad, but was told he needed to come to the hospital. Upon talking to his nurse from the facility, she stated that his O2 was 77% despite being on his home dose of 4L. She also noted that he was in and out of confusion and was difficult to arouse. He denies an SOB, cough, CP, palpitations, pre-syncope, or dysuria.     ED Course: Labs significant for VBG: pCO2 71.7.  CMP: CO2 35 BUN 7, Cr 0.45, Ca 8.6 Imaging: Mild CHF, mild to moderate right pleural effusion with chronic elevation of the right hemidiaphragm. Received lasix  20mg  IV, BiPAP   Past Medical History R CVA with hemiplegia Afib HFpEF Chronic hypoxemic hypercarbic respiratory failure Lupus anticoagulant.  Past Surgical History Past Surgical History:  Procedure Laterality Date   dental extraction     multiple   vena cavogram  12/2010   INFERIOR    Meds:  No outpatient medications have been marked as taking for the 12/10/23 encounter [facility]  Encounter).    Social:  Lives With: Occupation: Support: Level of Function: PCP:  Feliciano Devoria LABOR, MD   Family History:  Family History  Problem Relation Age of Onset   Sleep apnea Sister      Allergies: Allergies as of 12/10/2023 - Review Complete 12/10/2023  Allergen Reaction Noted   Fenofibrate Other (See Comments) 02/18/2011    Review of Systems: A complete ROS was negative except as per HPI.   OBJECTIVE:   Physical Exam: Blood pressure 112/76, pulse 93, temperature 98.4 F (36.9 C), temperature source Tympanic, resp. rate 14, height 6' 1 (1.854 m), weight 131.5 kg, SpO2 99%.  Constitutional: well-appearing with BiPAP, in no acute distress HENT: normocephalic atraumatic, mucous membranes moist Eyes: conjunctiva non-erythematous Neck: supple Cardiovascular: a-fib, no murmurs appreciated. Bilateral pitting edema R>L. Pulmonary/Chest: normal work of breathing on room air, decreased breath sounds bilaterally.  Abdominal: soft, non-tender, non-distended Neurological: alert & oriented x 3, Right hemiplegia from previous CVA Skin: warm and dry.    Labs: CBC    Component Value Date/Time   WBC 8.8 12/10/2023 1218   RBC 4.48 12/10/2023 1218   HGB 13.6 12/10/2023 1551   HGB 13.2 08/01/2022 1019   HGB 14.1 12/27/2012 1339   HCT 40.0 12/10/2023 1551   HCT 39.2 08/01/2022  1019   HCT 41.4 12/27/2012 1339   PLT 240 12/10/2023 1218   PLT 227 08/01/2022 1019   MCV 99.1 12/10/2023 1218   MCV 92 08/01/2022 1019   MCV 89.2 12/27/2012 1339   MCH 31.0 12/10/2023 1218   MCHC 31.3 12/10/2023 1218   RDW 14.2 12/10/2023 1218   RDW 12.9 08/01/2022 1019   RDW 13.8 12/27/2012 1339   LYMPHSABS 0.5 (L) 12/10/2023 1218   LYMPHSABS 1.9 12/27/2012 1339   MONOABS 0.7 12/10/2023 1218   MONOABS 0.6 12/27/2012 1339   EOSABS 0.2 12/10/2023 1218   EOSABS 0.4 12/27/2012 1339   BASOSABS 0.1 12/10/2023 1218   BASOSABS 0.0 12/27/2012 1339     CMP     Component Value Date/Time    NA 140 12/10/2023 1551   NA 143 08/01/2022 1019   NA 139 12/27/2012 1339   K 4.0 12/10/2023 1551   K 3.8 12/27/2012 1339   CL 98 12/10/2023 1218   CL 109 (H) 04/26/2012 1453   CO2 35 (H) 12/10/2023 1218   CO2 26 12/27/2012 1339   GLUCOSE 90 12/10/2023 1218   GLUCOSE 80 12/27/2012 1339   GLUCOSE 99 04/26/2012 1453   BUN 7 (L) 12/10/2023 1218   BUN 12 08/01/2022 1019   BUN 9.4 12/27/2012 1339   CREATININE 0.45 (L) 12/10/2023 1218   CREATININE 0.7 12/27/2012 1339   CALCIUM  8.6 (L) 12/10/2023 1218   CALCIUM  8.9 12/27/2012 1339   PROT 6.1 (L) 12/10/2023 1218   PROT 6.0 07/15/2019 1055   PROT 6.6 12/27/2012 1339   ALBUMIN 2.8 (L) 12/10/2023 1218   ALBUMIN 3.6 (L) 07/15/2019 1055   ALBUMIN 3.3 (L) 12/27/2012 1339   AST 20 12/10/2023 1218   AST 19 12/27/2012 1339   ALT 13 12/10/2023 1218   ALT 34 12/27/2012 1339   ALKPHOS 101 12/10/2023 1218   ALKPHOS 122 12/27/2012 1339   BILITOT 0.5 12/10/2023 1218   BILITOT <0.2 07/15/2019 1055   BILITOT 0.20 12/27/2012 1339   GFRNONAA >60 12/10/2023 1218   GFRAA >60 02/28/2020 0435    Imaging:  DG Chest Port 1 View Result Date: 12/10/2023 CLINICAL DATA:  Short of breath EXAM: PORTABLE CHEST 1 VIEW COMPARISON:  Prior chest x-ray 12/07/2023 FINDINGS: Significant enlargement of the cardiopericardial silhouette. Diffuse pulmonary vascular congestion with mild pulmonary edema. Small to moderate layering right pleural effusion. Left basilar atelectasis. No pneumothorax. IMPRESSION: 1. Mild CHF. 2. Mild to moderate right pleural effusion with chronic elevation of the right hemidiaphragm. Electronically Signed   By: Wilkie Lent M.D.   On: 12/10/2023 13:29     EKG: personally reviewed my interpretation isAtrial fibrillation with RVR. Prior EKG a-fib with RVR  ASSESSMENT & PLAN:   Assessment & Plan by Problem: Principal Problem:   Acute exacerbation of CHF (congestive heart failure) (HCC)   James Villegas is a 68 y.o. person living with a  history of L CVA with hemiplegia, afib/aflutter, HFpEF, chronic respiratory failure with hypercarbia, and lupus anticoagulant who presented with hypoxia and encephalopathy and admitted for acute on chronic hypoxemic hypercarbic respiratory failure on hospital day 0  Acute on chronic hypoxemic hypercarbic respiratory failure Pt with longstanding history of respiratory family. He was recently discharged on 7/1/225 back to his nursing facility in stable condition. At his facility, he was nonadherent to his BiPAP and this morning was found to be hypoxic and encephalopathic. He did have respiratory acidosis on exam. Will continue BiPAP and recheck VBG's every four hours until improved.  Acute on chronic heart failure with preserved EF Right pleural effusion Pleural effusion noted on CXR. This was not present on 12/07/2023. Likely volume overload. Diuresed today and will continue to monitor hypoxia and volume status.   Suspected PNA Suspected PNA from last admission. Will finish course of augmentin and doxycyline from discharge through 12/10/23.  Atrial fibrillation/atrial flutter On chronic warfarin. Cannot do DOAC due to lupus anticoagluant. Will dose warfarin per pharmacy. Maintain therapeutic INR between 2-3.   Epilepsy Chronic. On Dilantin . Last level WNL on 7/1  Hypertension Antihypertensives on hold due to mild hypotension this afternoon.   Best practice: Diet: Heart Healthy VTE: warfarin IVF: None, Code: Full  Disposition planning: Prior to Admission Living Arrangement: SNF, [facility]  Anticipated Discharge Location: SNF Barriers to Discharge: clinical improvement  Dispo: Admit patient to Observation with expected length of stay less than 2 midnights.  Signed: Myrna Bitters, DO Internal Medicine Resident  12/10/2023, 4:54 PM  Please contact IM Residency On-Call Pager at: [phone] or [phone].

## 2023-12-11 DIAGNOSIS — I5030 Unspecified diastolic (congestive) heart failure: Secondary | ICD-10-CM | POA: Diagnosis not present

## 2023-12-11 DIAGNOSIS — I509 Heart failure, unspecified: Secondary | ICD-10-CM

## 2023-12-11 DIAGNOSIS — R0689 Other abnormalities of breathing: Secondary | ICD-10-CM

## 2023-12-11 DIAGNOSIS — R0603 Acute respiratory distress: Secondary | ICD-10-CM | POA: Diagnosis not present

## 2023-12-11 LAB — CBC
HCT: 42.2 % (ref 39.0–52.0)
Hemoglobin: 13.2 g/dL (ref 13.0–17.0)
MCH: 30.5 pg (ref 26.0–34.0)
MCHC: 31.3 g/dL (ref 30.0–36.0)
MCV: 97.5 fL (ref 80.0–100.0)
Platelets: 215 K/uL (ref 150–400)
RBC: 4.33 MIL/uL (ref 4.22–5.81)
RDW: 14 % (ref 11.5–15.5)
WBC: 7.8 K/uL (ref 4.0–10.5)
nRBC: 0 % (ref 0.0–0.2)

## 2023-12-11 LAB — CULTURE, BLOOD (ROUTINE X 2)
Culture: NO GROWTH
Culture: NO GROWTH
Special Requests: ADEQUATE

## 2023-12-11 LAB — COMPREHENSIVE METABOLIC PANEL WITH GFR
ALT: 13 U/L (ref 0–44)
AST: 15 U/L (ref 15–41)
Albumin: 2.6 g/dL — ABNORMAL LOW (ref 3.5–5.0)
Alkaline Phosphatase: 93 U/L (ref 38–126)
Anion gap: 12 (ref 5–15)
BUN: 8 mg/dL (ref 8–23)
CO2: 36 mmol/L — ABNORMAL HIGH (ref 22–32)
Calcium: 8.6 mg/dL — ABNORMAL LOW (ref 8.9–10.3)
Chloride: 94 mmol/L — ABNORMAL LOW (ref 98–111)
Creatinine, Ser: 0.51 mg/dL — ABNORMAL LOW (ref 0.61–1.24)
GFR, Estimated: >60 mL/min (ref >60)
Glucose, Bld: 104 mg/dL — ABNORMAL HIGH (ref 70–99)
Potassium: 3.6 mmol/L (ref 3.5–5.1)
Sodium: 142 mmol/L (ref 135–145)
Total Bilirubin: 0.8 mg/dL (ref 0.0–1.2)
Total Protein: 5.6 g/dL — ABNORMAL LOW (ref 6.5–8.1)

## 2023-12-11 LAB — PROTIME-INR
INR: 2.1 — ABNORMAL HIGH (ref 0.8–1.2)
Prothrombin Time: 24.5 s — ABNORMAL HIGH (ref 11.4–15.2)

## 2023-12-11 MED ORDER — BOOST / RESOURCE BREEZE PO LIQD CUSTOM
1.0000 | Freq: Three times a day (TID) | ORAL | Status: DC
  Administered 2023-12-11 (×2): 1 via ORAL

## 2023-12-11 MED ORDER — POTASSIUM CHLORIDE CRYS ER 20 MEQ PO TBCR
40.0000 meq | EXTENDED_RELEASE_TABLET | Freq: Once | ORAL | Status: AC
  Administered 2023-12-11: 40 meq via ORAL

## 2023-12-11 MED ORDER — SENNOSIDES-DOCUSATE SODIUM 8.6-50 MG PO TABS
1.0000 | ORAL_TABLET | Freq: Every evening | ORAL | Status: AC | PRN
Start: 2023-12-11 — End: ?

## 2023-12-11 NOTE — Discharge Summary (Signed)
 Name: James Villegas MRN: [id number] DOB: 1955-11-15 68 y.o. PCP: Feliciano Devoria LABOR, MD  Date of Admission: 12/10/2023 12:00 PM Date of Discharge: 12/11/2023 Attending Physician: Dr. MICAEL Riis Winfrey  Discharge Diagnosis: 1. 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 Commonly known as: COZAAR Take 12.5 mg by mouth daily.   spironolactone  25 MG tablet Wait to take this until your doctor or other care provider tells you to start again. Commonly known as: ALDACTONE  Take 1 tablet (25 mg total) by mouth daily.   torsemide  20 MG tablet Wait to take this until your doctor or other care provider tells you to start again. Commonly known as: DEMADEX  Take 40 mg by mouth in the morning and at bedtime.       STOP taking these medications    amoxicillin -clavulanate 875-125 MG tablet Commonly known as: AUGMENTIN    doxycycline  100 MG tablet Commonly known as: VIBRA -TABS   linezolid  600 MG tablet Commonly known as: ZYVOX        TAKE these medications    acetaminophen  325 MG tablet Commonly known as: TYLENOL  Take 650 mg by mouth every 6 (six) hours as needed for mild pain, headache or fever.    antiseptic oral rinse Liqd 1 Application by Mouth Rinse route at bedtime.   artificial tears ophthalmic solution Place 1 drop into both eyes 4 (four) times daily.   Baclofen  5 MG Tabs Take 5 mg by mouth at bedtime.   bisacodyl  5 MG EC tablet Commonly known as: DULCOLAX Take 10 mg by mouth daily as needed for moderate constipation.   cycloSPORINE  0.05 % ophthalmic emulsion Commonly known as: RESTASIS  Place 1 drop into both eyes 2 (two) times daily.   Dilantin  Infatabs 50 MG tablet Generic drug: phenytoin  Chew 250 mg by mouth at bedtime.   DULoxetine  30 MG capsule Commonly known as: CYMBALTA  Take 30 mg by mouth in the morning.   ezetimibe  10 MG tablet Commonly known as: ZETIA  Take 10 mg by mouth daily.   fluticasone -salmeterol 250-50 MCG/ACT Aepb Commonly known as: ADVAIR Inhale 1 puff into the lungs in the morning and at bedtime.   guaiFENesin  100 MG/5ML liquid Commonly known as: ROBITUSSIN Take 15 mLs by mouth every 4 (four) hours as needed for cough.   ipratropium-albuterol  0.5-2.5 (3) MG/3ML Soln Commonly known as: DUONEB Take 3 mLs by nebulization every 4 (four) hours as needed.   Jardiance  10 MG Tabs tablet Generic drug: empagliflozin  Take 10 mg by mouth daily.   omeprazole  20 MG capsule Commonly known as: PRILOSEC Take 20 mg by mouth every other day.   OXYGEN Inhale 4 L into the lungs continuous.   phenytoin  100  MG ER capsule Commonly known as: DILANTIN  Take 200 mg by mouth 2 (two) times daily.   Refresh Optive Advanced 0.5-1-0.5 % Soln Generic drug: Carboxymeth-Glycerin -Polysorb Place 1 drop into both eyes in the morning, at noon, in the evening, and at bedtime. Wait 3-5 minutes between eye medications   rosuvastatin  40 MG tablet Commonly known as: CRESTOR  Take 40 mg by mouth at bedtime.   senna-docusate 8.6-50 MG tablet Commonly known as: Senokot-S Take 1 tablet by mouth at bedtime as needed for mild constipation.   traZODone  50 MG  tablet Commonly known as: DESYREL  Take 50 mg by mouth at bedtime.   warfarin 1 MG tablet Commonly known as: COUMADIN  Take as directed. If you are unsure how to take this medication, talk to your nurse or doctor. Original instructions: Take 1 mg by mouth daily. Take along with 2.5mg  tablet for a daily dose of 3.5mg  What changed: Another medication with the same name was removed. Continue taking this medication, and follow the directions you see here.        Disposition and follow-up:   [name] was discharged from [facility] in Fair condition.  At the hospital follow up visit please address:  Respiratory Status--please ensure patient is being adherent to the BiPAP.  Hypotension--antihypertensives held due to hypotension on exam.  INR--please recheck and ensure INR therapeutic between 2-3  2.  Labs / imaging needed at time of follow-up: [facility]    Hospital Course by problem list: James Villegas is a 68 y.o. person living with a history of hypoxemic hypoxic respiratory failure, L CVA with hemiparesis, and lupus anticoagulant who presented with hypoxia and encephalopathy and admitted for acute on chronic hypoxemic hypercarbic respiratory failure now being discharged on hospital day 0 with the following pertinent hospital course:  Acute on chronic hypoxemic hypercarbic respiratory failure Pt presented with hypoxia and encephalopathy on 7/3. Initial VBG showing CO2 of 71.4. Pt placed on BiPAP with improvement in respiratory status and encephalopathy. Continued on BiPAP throughout the day and night. On 7/4 he was coherent and asking to be sent home. He was deemed stable for d/c at this time.     Stable chronic medical conditions: Epilepsy Atrial fibrillation  Subjective Pt seen and examined at the bedside this morning. He stated he is feeling much better overall but again stated his discomfort when wearing the BiPAP. Again had a long discussion about goals of care  with him and the importance of wearing the BiPAP. He admitted his understanding. All further concerns were addressed at this time.   Discharge Exam:   BP 122/76 (BP Location: Left Arm)   Pulse 84   Temp 98.3 F (36.8 C) (Oral)   Resp 20   Ht 6' 1 (1.854 m)   Wt 130.5 kg   SpO2 98%   BMI 37.96 kg/m  Discharge exam:  Constitutional: well-appearing, in no acute distress HENT: normocephalic atraumatic, mucous membranes moist Eyes: conjunctiva non-erythematous Neck: supple Cardiovascular: a-fib, no murmurs appreciated. Bilateral pitting edema R>L. Pulmonary/Chest: normal work of breathing on room air, decreased breath sounds bilaterally.  Abdominal: soft, non-tender, non-distended Neurological: alert & oriented x 3, Right hemiplegia from previous CVA Skin: warm and dry.   Pertinent Labs, Studies, and Procedures:     Latest Ref Rng & Units 12/11/2023    2:46 AM 12/10/2023    3:51 PM 12/10/2023   12:43 PM  CBC  WBC 4.0 - 10.5 K/uL 7.8     Hemoglobin 13.0 -  17.0 g/dL 86.7  86.3  85.6   Hematocrit 39.0 - 52.0 % 42.2  40.0  42.0   Platelets 150 - 400 K/uL 215          Latest Ref Rng & Units 12/11/2023    2:46 AM 12/10/2023    3:51 PM 12/10/2023   12:43 PM  CMP  Glucose 70 - 99 mg/dL 895     BUN 8 - 23 mg/dL 8     Creatinine 9.38 - 1.24 mg/dL 9.48     Sodium 864 - 854 mmol/L 142  140  140   Potassium 3.5 - 5.1 mmol/L 3.6  4.0  3.8   Chloride 98 - 111 mmol/L 94     CO2 22 - 32 mmol/L 36     Calcium  8.9 - 10.3 mg/dL 8.6     Total Protein 6.5 - 8.1 g/dL 5.6     Total Bilirubin 0.0 - 1.2 mg/dL 0.8     Alkaline Phos 38 - 126 U/L 93     AST 15 - 41 U/L 15     ALT 0 - 44 U/L 13       DG Chest Port 1 View Result Date: 12/10/2023 CLINICAL DATA:  Short of breath EXAM: PORTABLE CHEST 1 VIEW COMPARISON:  Prior chest x-ray 12/07/2023 FINDINGS: Significant enlargement of the cardiopericardial silhouette. Diffuse pulmonary vascular congestion with mild pulmonary edema. Small to moderate layering  right pleural effusion. Left basilar atelectasis. No pneumothorax. IMPRESSION: 1. Mild CHF. 2. Mild to moderate right pleural effusion with chronic elevation of the right hemidiaphragm. Electronically Signed   By: Wilkie Lent M.D.   On: 12/10/2023 13:29     Discharge Instructions:  Thank you for allowing us  to be part of your care. You were hospitalized for acute on chronic hypoxemic hypercarbic respiratory. We treated you with BiPAP and lasix .   See the changes in your medications and management of your chronic conditions below:  *For your Breathing -We have continued the following medications:  -inhaler (BREO ELLIPTA )  -BiPAP  -We have STOPPED the following medications:  -Torsemide    -Augmentin   -Doxycycline              -Linezolid   *For your Heart -We have continued you on medications:  -warfarin  -crestor   -jardiance   - -We have STOPPED the following medications:  -Spironolactone   -  - -Please see your PCP in 7 to 10 days  FOLLOW UP APPOINTMENTS: Please visit your PCP (Dr Feliciano) in 7-14 days   Please make sure to Wear your BiPAP  Please call your PCP or our clinic if you have any questions or concerns, we may be able to help and keep you from a long and expensive emergency room wait. Our clinic and after hours phone number is [phone]. The best time to call is Monday through Friday 9 am to 4 pm but there is always someone available 24/7 if you have an emergency. If you need medication refills please notify your pharmacy one week in advance and they will send us  a request.   We are glad you are feeling better,  Schuyler Novak, DO Internal Medicine Inpatient Teaching Service at [facility]  Signed: Novak Schuyler, DO 12/11/2023, 12:07 PM

## 2023-12-11 NOTE — Discharge Instructions (Signed)
 Thank you for allowing us  to be part of your care. You were hospitalized for acute on chronic hypoxemic hypercarbic respiratory. We treated you with BiPAP and lasix .   See the changes in your medications and management of your chronic conditions below:  *For your Breathing -We have continued the following medications:  -inhaler (BREO ELLIPTA )  -BiPAP  -We have STOPPED the following medications:  -Torsemide    -Augmentin   -Doxycycline              -Linezolid   *For your Heart -We have continued you on medications:  -warfarin  -crestor   -jardiance   - -We have STOPPED the following medications:  -Spironolactone   -  - -Please see your PCP in 7 to 10 days  FOLLOW UP APPOINTMENTS: Please visit your PCP (Dr Feliciano) in 7-14 days   Please make sure to Wear your BiPAP  Please call your PCP or our clinic if you have any questions or concerns, we may be able to help and keep you from a long and expensive emergency room wait. Our clinic and after hours phone number is [phone]. The best time to call is Monday through Friday 9 am to 4 pm but there is always someone available 24/7 if you have an emergency. If you need medication refills please notify your pharmacy one week in advance and they will send us  a request.   We are glad you are feeling better,  Schuyler Novak, DO Internal Medicine Inpatient Teaching Service at [facility]

## 2023-12-11 NOTE — Plan of Care (Signed)
   Problem: Nutrition: Goal: Adequate nutrition will be maintained Outcome: Progressing

## 2023-12-11 NOTE — TOC CM/SW Note (Signed)
 Transition of Care [facility]) - Inpatient Brief Assessment   Patient Details  Name: James Villegas MRN: [id number] Date of Birth: 1955/11/03  Transition of Care [facility]) CM/SW Contact:    Waddell Barnie Rama, RN Phone Number: 12/11/2023, 12:38 PM   Clinical Narrative: From [facility], has PCP and insurance on file, He contacted his sister Christianjames Soule to let her know he will be going back to [place] today.  Sister is family support system.  CSW aware will get ambulance transport back to SNF.    Transition of Care Asessment: Insurance and Status: Insurance coverage has been reviewed Patient has primary care physician: Yes Home environment has been reviewed: from [facility] Prior level of function:: SNF Prior/Current Home Services: No current home services Social Drivers of Health Review: SDOH reviewed no interventions necessary Readmission risk has been reviewed: Yes Transition of care needs: transition of care needs identified, TOC will continue to follow

## 2023-12-11 NOTE — TOC Transition Note (Signed)
 Transition of Care [facility]) - Discharge Note   Patient Details  Name: James Villegas MRN: [id number] Date of Birth: Mar 31, 1956  Transition of Care [facility]) CM/SW Contact:  Montie LOISE Louder, LCSW Phone Number: 12/11/2023, 12:42 PM   Clinical Narrative:     Patient will Discharge to: [facility] Discharge Date: 12/11/2023 Family Notified: patient states will call family Transport By: ROME  Per MD patient is ready for discharge. RN, patient, and facility notified of discharge. Discharge Summary sent to facility. RN given number for [phone]. Ambulance transport requested for patient.   Clinical Social Worker signing off.  Montie Louder, MSW, LCSW Clinical Social Worker     Final next level of care: Skilled Nursing Facility Barriers to Discharge: Barriers Resolved   Patient Goals and CMS Choice            Discharge Placement              Patient chooses bed at:  [facility]) Patient to be transferred to facility by: [facility] Name of family member notified: patient will contact family Patient and family notified of of transfer: 12/11/23  Discharge Plan and Services Additional resources added to the After Visit Summary for                                       Social Drivers of Health (SDOH) Interventions SDOH Screenings   Food Insecurity: Patient Declined (12/11/2023)  Housing: Patient Declined (12/11/2023)  Transportation Needs: No Transportation Needs (12/11/2023)  Utilities: Patient Declined (12/11/2023)  Social Connections: Moderately Integrated (12/11/2023)  Tobacco Use: Medium Risk (12/10/2023)     Readmission Risk Interventions    08/26/2022   11:56 AM  Readmission Risk Prevention Plan  Transportation Screening Complete  PCP or Specialist Appt within 5-7 Days Complete  Home Care Screening Complete  Medication Review (RN CM) Referral to Pharmacy

## 2023-12-11 NOTE — Hospital Course (Signed)
 Thank you for allowing us  to be part of your care. You were hospitalized for acute on chronic hypoxemic hypercarbic respiratory. We treated you with BiPAP and lasix .   See the changes in your medications and management of your chronic conditions below:  *For your Breathing -We have continued the following medications:  -inhaler (BREO ELLIPTA )  -BiPAP  -We have STOPPED the following medications:  -Torsemide    -Augmentin   -Doxycycline              -Linezolid   *For your Heart -We have continued you on medications:  -warfarin  -crestor   -jardiance   - -We have STOPPED the following medications:  -Spironolactone   -  - -Please see your PCP in 7 to 10 days  FOLLOW UP APPOINTMENTS: Please visit your PCP (Dr Feliciano) in 7-14 days   Please make sure to Wear your BiPAP  Please call your PCP or our clinic if you have any questions or concerns, we may be able to help and keep you from a long and expensive emergency room wait. Our clinic and after hours phone number is [phone]. The best time to call is Monday through Friday 9 am to 4 pm but there is always someone available 24/7 if you have an emergency. If you need medication refills please notify your pharmacy one week in advance and they will send us  a request.   We are glad you are feeling better,  Schuyler Novak, DO Internal Medicine Inpatient Teaching Service at [facility]

## 2023-12-19 ENCOUNTER — Inpatient Hospital Stay (HOSPITAL_COMMUNITY)
Admission: EM | Admit: 2023-12-19 | Discharge: 2023-12-23 | DRG: 189 | Disposition: A | Discharge status: Skilled Nursing Facility | Source: Skilled Nursing Facility | Attending: Internal Medicine | Admitting: Internal Medicine

## 2023-12-19 ENCOUNTER — Other Ambulatory Visit: Payer: Self-pay

## 2023-12-19 ENCOUNTER — Emergency Department (HOSPITAL_COMMUNITY)

## 2023-12-19 DIAGNOSIS — Z7951 Long term (current) use of inhaled steroids: Secondary | ICD-10-CM

## 2023-12-19 DIAGNOSIS — Z79899 Other long term (current) drug therapy: Secondary | ICD-10-CM

## 2023-12-19 DIAGNOSIS — Z95828 Presence of other vascular implants and grafts: Secondary | ICD-10-CM

## 2023-12-19 DIAGNOSIS — Z87891 Personal history of nicotine dependence: Secondary | ICD-10-CM

## 2023-12-19 DIAGNOSIS — Z993 Dependence on wheelchair: Secondary | ICD-10-CM

## 2023-12-19 DIAGNOSIS — I69351 Hemiplegia and hemiparesis following cerebral infarction affecting right dominant side: Secondary | ICD-10-CM

## 2023-12-19 DIAGNOSIS — I5032 Chronic diastolic (congestive) heart failure: Secondary | ICD-10-CM | POA: Diagnosis present

## 2023-12-19 DIAGNOSIS — J449 Chronic obstructive pulmonary disease, unspecified: Secondary | ICD-10-CM | POA: Diagnosis present

## 2023-12-19 DIAGNOSIS — L89312 Pressure ulcer of right buttock, stage 2: Secondary | ICD-10-CM | POA: Diagnosis present

## 2023-12-19 DIAGNOSIS — I4819 Other persistent atrial fibrillation: Secondary | ICD-10-CM | POA: Diagnosis present

## 2023-12-19 DIAGNOSIS — G40909 Epilepsy, unspecified, not intractable, without status epilepticus: Secondary | ICD-10-CM | POA: Diagnosis present

## 2023-12-19 DIAGNOSIS — G4733 Obstructive sleep apnea (adult) (pediatric): Secondary | ICD-10-CM | POA: Diagnosis present

## 2023-12-19 DIAGNOSIS — I77819 Aortic ectasia, unspecified site: Secondary | ICD-10-CM | POA: Diagnosis present

## 2023-12-19 DIAGNOSIS — Z1152 Encounter for screening for COVID-19: Secondary | ICD-10-CM

## 2023-12-19 DIAGNOSIS — I272 Pulmonary hypertension, unspecified: Secondary | ICD-10-CM | POA: Diagnosis present

## 2023-12-19 DIAGNOSIS — Z7401 Bed confinement status: Secondary | ICD-10-CM

## 2023-12-19 DIAGNOSIS — E785 Hyperlipidemia, unspecified: Secondary | ICD-10-CM | POA: Diagnosis present

## 2023-12-19 DIAGNOSIS — Z7901 Long term (current) use of anticoagulants: Secondary | ICD-10-CM

## 2023-12-19 DIAGNOSIS — D72829 Elevated white blood cell count, unspecified: Secondary | ICD-10-CM | POA: Diagnosis present

## 2023-12-19 DIAGNOSIS — I6930 Unspecified sequelae of cerebral infarction: Secondary | ICD-10-CM

## 2023-12-19 DIAGNOSIS — R000 Tachycardia, unspecified: Secondary | ICD-10-CM | POA: Diagnosis present

## 2023-12-19 DIAGNOSIS — Z6837 Body mass index (BMI) 37.0-37.9, adult: Secondary | ICD-10-CM

## 2023-12-19 DIAGNOSIS — J9622 Acute and chronic respiratory failure with hypercapnia: Principal | ICD-10-CM | POA: Diagnosis present

## 2023-12-19 DIAGNOSIS — I110 Hypertensive heart disease with heart failure: Secondary | ICD-10-CM | POA: Diagnosis present

## 2023-12-19 DIAGNOSIS — Z7409 Other reduced mobility: Secondary | ICD-10-CM | POA: Diagnosis present

## 2023-12-19 DIAGNOSIS — I4892 Unspecified atrial flutter: Secondary | ICD-10-CM | POA: Diagnosis present

## 2023-12-19 DIAGNOSIS — I482 Chronic atrial fibrillation, unspecified: Secondary | ICD-10-CM | POA: Diagnosis present

## 2023-12-19 DIAGNOSIS — Z86718 Personal history of other venous thrombosis and embolism: Secondary | ICD-10-CM

## 2023-12-19 DIAGNOSIS — J9621 Acute and chronic respiratory failure with hypoxia: Secondary | ICD-10-CM | POA: Diagnosis not present

## 2023-12-19 DIAGNOSIS — R471 Dysarthria and anarthria: Secondary | ICD-10-CM | POA: Diagnosis present

## 2023-12-19 DIAGNOSIS — L89302 Pressure ulcer of unspecified buttock, stage 2: Secondary | ICD-10-CM

## 2023-12-19 DIAGNOSIS — K219 Gastro-esophageal reflux disease without esophagitis: Secondary | ICD-10-CM | POA: Diagnosis present

## 2023-12-19 DIAGNOSIS — I4510 Unspecified right bundle-branch block: Secondary | ICD-10-CM | POA: Diagnosis present

## 2023-12-19 DIAGNOSIS — Z888 Allergy status to other drugs, medicaments and biological substances status: Secondary | ICD-10-CM

## 2023-12-19 DIAGNOSIS — G9341 Metabolic encephalopathy: Secondary | ICD-10-CM | POA: Diagnosis present

## 2023-12-19 DIAGNOSIS — E878 Other disorders of electrolyte and fluid balance, not elsewhere classified: Secondary | ICD-10-CM | POA: Diagnosis present

## 2023-12-19 DIAGNOSIS — Z7984 Long term (current) use of oral hypoglycemic drugs: Secondary | ICD-10-CM

## 2023-12-19 DIAGNOSIS — I509 Heart failure, unspecified: Secondary | ICD-10-CM

## 2023-12-19 DIAGNOSIS — D6862 Lupus anticoagulant syndrome: Secondary | ICD-10-CM | POA: Diagnosis present

## 2023-12-19 DIAGNOSIS — L304 Erythema intertrigo: Secondary | ICD-10-CM | POA: Diagnosis present

## 2023-12-19 DIAGNOSIS — L89322 Pressure ulcer of left buttock, stage 2: Secondary | ICD-10-CM | POA: Diagnosis present

## 2023-12-19 DIAGNOSIS — I5033 Acute on chronic diastolic (congestive) heart failure: Secondary | ICD-10-CM | POA: Diagnosis present

## 2023-12-19 LAB — CBC WITH DIFFERENTIAL/PLATELET
Abs Immature Granulocytes: 0.39 K/uL — ABNORMAL HIGH (ref 0.00–0.07)
Basophils Absolute: 0.2 K/uL — ABNORMAL HIGH (ref 0.0–0.1)
Basophils Relative: 1 %
Eosinophils Absolute: 0.5 K/uL (ref 0.0–0.5)
Eosinophils Relative: 3 %
HCT: 48.9 % (ref 39.0–52.0)
Hemoglobin: 14.7 g/dL (ref 13.0–17.0)
Immature Granulocytes: 2 %
Lymphocytes Relative: 3 %
Lymphs Abs: 0.4 K/uL — ABNORMAL LOW (ref 0.7–4.0)
MCH: 30.8 pg (ref 26.0–34.0)
MCHC: 30.1 g/dL (ref 30.0–36.0)
MCV: 102.3 fL — ABNORMAL HIGH (ref 80.0–100.0)
Monocytes Absolute: 1.4 K/uL — ABNORMAL HIGH (ref 0.1–1.0)
Monocytes Relative: 9 %
Neutro Abs: 13.3 K/uL — ABNORMAL HIGH (ref 1.7–7.7)
Neutrophils Relative %: 82 %
Platelets: 351 K/uL (ref 150–400)
RBC: 4.78 MIL/uL (ref 4.22–5.81)
RDW: 15 % (ref 11.5–15.5)
WBC: 16.2 K/uL — ABNORMAL HIGH (ref 4.0–10.5)
nRBC: 0 % (ref 0.0–0.2)

## 2023-12-19 LAB — COMPREHENSIVE METABOLIC PANEL WITH GFR
ALT: 18 U/L (ref 0–44)
AST: 23 U/L (ref 15–41)
Albumin: 3 g/dL — ABNORMAL LOW (ref 3.5–5.0)
Alkaline Phosphatase: 145 U/L — ABNORMAL HIGH (ref 38–126)
Anion gap: 10 (ref 5–15)
BUN: 7 mg/dL — ABNORMAL LOW (ref 8–23)
CO2: 35 mmol/L — ABNORMAL HIGH (ref 22–32)
Calcium: 8.5 mg/dL — ABNORMAL LOW (ref 8.9–10.3)
Chloride: 93 mmol/L — ABNORMAL LOW (ref 98–111)
Creatinine, Ser: 0.63 mg/dL (ref 0.61–1.24)
GFR, Estimated: >60 mL/min (ref >60)
Glucose, Bld: 129 mg/dL — ABNORMAL HIGH (ref 70–99)
Potassium: 5.6 mmol/L — ABNORMAL HIGH (ref 3.5–5.1)
Sodium: 138 mmol/L (ref 135–145)
Total Bilirubin: 0.7 mg/dL (ref 0.0–1.2)
Total Protein: 6.8 g/dL (ref 6.5–8.1)

## 2023-12-19 LAB — BASIC METABOLIC PANEL WITH GFR
Anion gap: 10 (ref 5–15)
BUN: 7 mg/dL — ABNORMAL LOW (ref 8–23)
CO2: 33 mmol/L — ABNORMAL HIGH (ref 22–32)
Calcium: 7.7 mg/dL — ABNORMAL LOW (ref 8.9–10.3)
Chloride: 95 mmol/L — ABNORMAL LOW (ref 98–111)
Creatinine, Ser: 0.57 mg/dL — ABNORMAL LOW (ref 0.61–1.24)
GFR, Estimated: >60 mL/min (ref >60)
Glucose, Bld: 87 mg/dL (ref 70–99)
Potassium: 5 mmol/L (ref 3.5–5.1)
Sodium: 138 mmol/L (ref 135–145)

## 2023-12-19 LAB — I-STAT VENOUS BLOOD GAS, ED
Acid-Base Excess: 9 mmol/L — ABNORMAL HIGH (ref 0.0–2.0)
Acid-Base Excess: 9 mmol/L — ABNORMAL HIGH (ref 0.0–2.0)
Acid-Base Excess: 13 mmol/L — ABNORMAL HIGH (ref 0.0–2.0)
Bicarbonate: 40 mmol/L — ABNORMAL HIGH (ref 20.0–28.0)
Bicarbonate: 39.1 mmol/L — ABNORMAL HIGH (ref 20.0–28.0)
Bicarbonate: 41.8 mmol/L — ABNORMAL HIGH (ref 20.0–28.0)
Calcium, Ion: 1.02 mmol/L — ABNORMAL LOW (ref 1.15–1.40)
Calcium, Ion: 0.97 mmol/L — ABNORMAL LOW (ref 1.15–1.40)
Calcium, Ion: 0.99 mmol/L — ABNORMAL LOW (ref 1.15–1.40)
HCT: 45 % (ref 39.0–52.0)
HCT: 44 % (ref 39.0–52.0)
HCT: 41 % (ref 39.0–52.0)
Hemoglobin: 15.3 g/dL (ref 13.0–17.0)
Hemoglobin: 15 g/dL (ref 13.0–17.0)
Hemoglobin: 13.9 g/dL (ref 13.0–17.0)
O2 Saturation: 98 %
O2 Saturation: 99 %
O2 Saturation: 97 %
Potassium: 5.5 mmol/L — ABNORMAL HIGH (ref 3.5–5.1)
Potassium: 5.4 mmol/L — ABNORMAL HIGH (ref 3.5–5.1)
Potassium: 5.1 mmol/L (ref 3.5–5.1)
Sodium: 135 mmol/L (ref 135–145)
Sodium: 135 mmol/L (ref 135–145)
Sodium: 136 mmol/L (ref 135–145)
TCO2: 42 mmol/L — ABNORMAL HIGH (ref 22–32)
TCO2: 41 mmol/L — ABNORMAL HIGH (ref 22–32)
TCO2: 44 mmol/L — ABNORMAL HIGH (ref 22–32)
pCO2, Ven: 83.8 mmHg (ref 44–60)
pCO2, Ven: 76.8 mmHg (ref 44–60)
pCO2, Ven: 75.5 mmHg (ref 44–60)
pH, Ven: 7.286 (ref 7.25–7.43)
pH, Ven: 7.315 (ref 7.25–7.43)
pH, Ven: 7.352 (ref 7.25–7.43)
pO2, Ven: 132 mmHg — ABNORMAL HIGH (ref 32–45)
pO2, Ven: 145 mmHg — ABNORMAL HIGH (ref 32–45)
pO2, Ven: 101 mmHg — ABNORMAL HIGH (ref 32–45)

## 2023-12-19 LAB — I-STAT CG4 LACTIC ACID, ED
Lactic Acid, Venous: 1.3 mmol/L (ref 0.5–1.9)
Lactic Acid, Venous: 0.9 mmol/L (ref 0.5–1.9)

## 2023-12-19 LAB — PHENYTOIN LEVEL, TOTAL: Phenytoin Lvl: 7.6 ug/mL — ABNORMAL LOW (ref 10.0–20.0)

## 2023-12-19 LAB — RESP PANEL BY RT-PCR (RSV, FLU A&B, COVID)  RVPGX2
Influenza A by PCR: NEGATIVE
Influenza B by PCR: NEGATIVE
Resp Syncytial Virus by PCR: NEGATIVE
SARS Coronavirus 2 by RT PCR: NEGATIVE

## 2023-12-19 LAB — PROTIME-INR
INR: 2.4 — ABNORMAL HIGH (ref 0.8–1.2)
Prothrombin Time: 26.9 s — ABNORMAL HIGH (ref 11.4–15.2)

## 2023-12-19 LAB — TROPONIN I (HIGH SENSITIVITY)
Troponin I (High Sensitivity): 23 ng/L — ABNORMAL HIGH (ref <18)
Troponin I (High Sensitivity): 23 ng/L — ABNORMAL HIGH (ref <18)

## 2023-12-19 LAB — BRAIN NATRIURETIC PEPTIDE: B Natriuretic Peptide: 315.9 pg/mL — ABNORMAL HIGH (ref 0.0–100.0)

## 2023-12-19 LAB — PROCALCITONIN: Procalcitonin: <0.1 ng/mL

## 2023-12-19 MED ORDER — PHENYTOIN 50 MG PO CHEW
50.0000 mg | CHEWABLE_TABLET | Freq: Every day | ORAL | Status: DC
  Administered 2023-12-19 – 2023-12-22 (×4): 50 mg via ORAL
  Filled 2023-12-19 (×5): qty 1

## 2023-12-19 MED ORDER — CYCLOSPORINE 0.05 % OP EMUL
1.0000 [drp] | Freq: Two times a day (BID) | OPHTHALMIC | Status: DC
  Administered 2023-12-19 – 2023-12-23 (×8): 1 [drp] via OPHTHALMIC
  Filled 2023-12-19 (×9): qty 30

## 2023-12-19 MED ORDER — FUROSEMIDE 10 MG/ML IJ SOLN
40.0000 mg | Freq: Once | INTRAMUSCULAR | Status: AC
  Administered 2023-12-19: 40 mg via INTRAVENOUS
  Filled 2023-12-19: qty 4

## 2023-12-19 MED ORDER — IPRATROPIUM-ALBUTEROL 0.5-2.5 (3) MG/3ML IN SOLN: 3.0000 mL | RESPIRATORY_TRACT | Status: DC | PRN

## 2023-12-19 MED ORDER — DULOXETINE HCL 30 MG PO CPEP
30.0000 mg | ORAL_CAPSULE | Freq: Every morning | ORAL | Status: DC
  Administered 2023-12-20 – 2023-12-23 (×4): 30 mg via ORAL
  Filled 2023-12-19 (×4): qty 1

## 2023-12-19 MED ORDER — FLUTICASONE FUROATE-VILANTEROL 200-25 MCG/ACT IN AEPB
1.0000 | INHALATION_SPRAY | Freq: Every day | RESPIRATORY_TRACT | Status: DC
  Administered 2023-12-20 – 2023-12-21 (×2): 1 via RESPIRATORY_TRACT
  Filled 2023-12-19: qty 28

## 2023-12-19 MED ORDER — WARFARIN SODIUM 3 MG PO TABS
3.5000 mg | ORAL_TABLET | Freq: Once | ORAL | Status: DC
  Filled 2023-12-19: qty 1

## 2023-12-19 MED ORDER — PHENYTOIN SODIUM EXTENDED 100 MG PO CAPS
200.0000 mg | ORAL_CAPSULE | Freq: Two times a day (BID) | ORAL | Status: DC
  Administered 2023-12-19 – 2023-12-23 (×8): 200 mg via ORAL
  Filled 2023-12-19 (×9): qty 2

## 2023-12-19 MED ORDER — NYSTATIN 100000 UNIT/GM EX POWD
Freq: Three times a day (TID) | CUTANEOUS | Status: DC
  Administered 2023-12-20: 1 via TOPICAL
  Filled 2023-12-19: qty 15

## 2023-12-19 MED ORDER — WARFARIN - PHARMACIST DOSING INPATIENT: Freq: Every day | Status: DC

## 2023-12-19 MED ORDER — ROSUVASTATIN CALCIUM 20 MG PO TABS
40.0000 mg | ORAL_TABLET | Freq: Every day | ORAL | Status: DC
  Administered 2023-12-20 – 2023-12-22 (×3): 40 mg via ORAL
  Filled 2023-12-19 (×3): qty 2

## 2023-12-19 NOTE — ED Notes (Signed)
 RT called regarding patient's low O2 sat. MD notified.

## 2023-12-19 NOTE — ED Notes (Signed)
 Patient transported to X-ray

## 2023-12-19 NOTE — ED Notes (Signed)
 Awaiting 1700 verification for meds

## 2023-12-19 NOTE — ED Notes (Signed)
 RT called to place patient on bipap

## 2023-12-19 NOTE — ED Provider Notes (Signed)
  EMERGENCY DEPARTMENT AT [facility] Provider Note   CSN: [id number] Arrival date & time: 12/19/23  9084     Patient presents with: Altered Mental Status   James Villegas is a 68 y.o. male.   Pt is a 67y/o male with hx of persistent afib on coumadin , seizure, hypoxemic hypoxic respiratory failure, R CVA with hemiparesis, and lupus anticoagulant who is presenting today from his facility due to difficulty to arouse and altered mental status.  Patient was recently seen on 12/10/2023 for respiratory failure requiring BiPAP and admitted to the hospital.  At that time he was felt to have fluid overload had discontinuation of antibiotics diuresis and was improved and was discharged on the fourth.  Since patient has been back at his living facility they report he has never been back to baseline but he was still awake and conversant however today they were unable to arouse him and he seemed to have more difficulty breathing.  The facility denied any fever or productive cough.  They did notice a rash in the right side of his neck yesterday.  Also in his groin area.  EMS reports upon their arrival patient was hypertensive tachycardic and very lethargic.  Initially sats were normal but dropped to 78 while in the truck requiring a nonrebreather to get his sats back up.  Patient will open his eyes to voice but will not give any further history.  The history is provided by the EMS personnel, the nursing home and medical records. The history is limited by the condition of the patient.  Altered Mental Status      Prior to Admission medications   Medication Sig Start Date End Date Taking? Authorizing Provider  acetaminophen  (TYLENOL ) 325 MG tablet Take 650 mg by mouth every 6 (six) hours as needed for mild pain, headache or fever.   Yes [provider]  antiseptic oral rinse (BIOTENE) LIQD 1 Application by Mouth Rinse route at bedtime.   Yes [provider]  ARTIFICIAL  TEAR OP Place 1 drop into both eyes in the morning, at noon, in the evening, and at bedtime.   Yes [provider]  Baclofen  5 MG TABS Take 5 mg by mouth at bedtime. 02/19/23  Yes [provider]  bisacodyl  (DULCOLAX) 5 MG EC tablet Take 10 mg by mouth daily as needed for moderate constipation.   Yes [provider]  Carboxymeth-Glycerin -Polysorb (REFRESH OPTIVE ADVANCED) 0.5-1-0.5 % SOLN Place 1 drop into both eyes in the morning, at noon, in the evening, and at bedtime.   Yes [provider]  cycloSPORINE  (RESTASIS ) 0.05 % ophthalmic emulsion Place 1 drop into both eyes 2 (two) times daily.   Yes [provider]  Dentifrices (BIOTENE DRY MOUTH GENTLE) PSTE Place 1 Application onto teeth at bedtime.   Yes [provider]  DULoxetine  (CYMBALTA ) 30 MG capsule Take 30 mg by mouth in the morning. 08/31/23  Yes [provider]  empagliflozin  (JARDIANCE ) 10 MG TABS tablet Take 10 mg by mouth daily.   Yes [provider]  ezetimibe  (ZETIA ) 10 MG tablet Take 10 mg by mouth daily.   Yes [provider]  fluticasone -salmeterol (ADVAIR) 250-50 MCG/ACT AEPB Inhale 1 puff into the lungs in the morning and at bedtime.   Yes [provider]  guaiFENesin  (ROBITUSSIN) 100 MG/5ML liquid Take 15 mLs by mouth every 4 (four) hours as needed for cough.   Yes [provider]  hydrocortisone  cream 1 % Apply  1 Application topically daily. Apply to right neck for 7 days starting on 12/18/23.   Yes [provider]  ipratropium-albuterol  (DUONEB) 0.5-2.5 (3) MG/3ML SOLN Take 3 mLs by nebulization every 4 (four) hours as needed. 10/23/23  Yes Elnora Ip, MD  omeprazole  (PRILOSEC) 20 MG capsule Take 20 mg by mouth every other day. 08/01/21  Yes [provider]  OXYGEN Inhale 4 L into the lungs continuous.   Yes [provider]  phenytoin  (DILANTIN  INFATABS) 50 MG tablet Chew 250 mg by mouth at  bedtime.   Yes [provider]  phenytoin  (DILANTIN ) 100 MG ER capsule Take 200 mg by mouth 2 (two) times daily.   Yes [provider]  rosuvastatin  (CRESTOR ) 40 MG tablet Take 40 mg by mouth at bedtime.   Yes [provider]  senna-docusate (SENOKOT-S) 8.6-50 MG tablet Take 1 tablet by mouth at bedtime as needed for mild constipation. Patient taking differently: Take 1 tablet by mouth daily as needed for mild constipation. 12/11/23  Yes Elnora Ip, MD  traZODone  (DESYREL ) 50 MG tablet Take 50 mg by mouth at bedtime. 08/01/21  Yes [provider]  valACYclovir (VALTREX) 1000 MG tablet Take 1,000 mg by mouth 2 (two) times daily.   Yes [provider]  warfarin (COUMADIN ) 1 MG tablet Take 1 mg by mouth daily. Take along with 2.5mg  tablet for a daily dose of 3.5mg    Yes [provider]  warfarin (COUMADIN ) 2.5 MG tablet Take 2.5 mg by mouth daily.   Yes [provider]  amoxicillin -clavulanate (AUGMENTIN ) 875-125 MG tablet Take 1 tablet by mouth 2 (two) times daily. Patient not taking: Reported on 12/19/2023    [provider]  doxycycline  (ADOXA) 100 MG tablet Take 100 mg by mouth 2 (two) times daily. Patient not taking: Reported on 12/19/2023    [provider]  linezolid  (ZYVOX ) 600 MG tablet Take 600 mg by mouth 2 (two) times daily. Patient not taking: Reported on 12/19/2023    [provider]  losartan (COZAAR) 25 MG tablet Take 12.5 mg by mouth daily. Patient not taking: Reported on 12/06/2023    [provider]  spironolactone  (ALDACTONE ) 25 MG tablet Take 1 tablet (25 mg total) by mouth daily. Patient not taking: Reported on 12/19/2023 09/22/21 12/10/24  Willette Adriana LABOR, MD  torsemide  (DEMADEX ) 20 MG tablet Take 40 mg by mouth in the morning and at bedtime. Patient not taking: Reported on 12/19/2023 08/31/23   [provider]    Allergies: Fenofibrate    Review of  Systems  Updated Vital Signs BP 118/75   Pulse 95   Temp (!) 96.8 F (36 C) (Rectal)   Resp 11   SpO2 98%   Physical Exam Vitals and nursing note reviewed.  Constitutional:      General: He is not in acute distress.    Appearance: He is well-developed. He is ill-appearing.  HENT:     Head: Normocephalic and atraumatic.     Mouth/Throat:     Mouth: Mucous membranes are dry.  Eyes:     Conjunctiva/sclera: Conjunctivae normal.     Pupils: Pupils are equal, round, and reactive to light.  Cardiovascular:     Rate and Rhythm: Regular rhythm. Tachycardia present.     Pulses: Normal pulses.     Heart sounds: No murmur heard. Pulmonary:     Effort: Pulmonary effort is normal. No respiratory distress.     Breath sounds: Normal breath sounds. Decreased air movement  present. No wheezing or rales.  Abdominal:     General: There is no distension.     Palpations: Abdomen is soft.     Tenderness: There is no abdominal tenderness. There is no guarding or rebound.  Musculoskeletal:        General: No tenderness. Normal range of motion.     Cervical back: Normal range of motion and neck supple.     Right lower leg: Edema present.     Comments: 2-3+ edema in the right foot and leg.  Minimal edema in the left leg  Skin:    General: Skin is warm and dry.     Coloration: Skin is pale.     Findings: No erythema or rash.  Neurological:     Mental Status: He is lethargic.     Comments: Patient will open his eyes to his name but will not speak.  Not following commands at this time     (all labs ordered are listed, but only abnormal results are displayed) Labs Reviewed  CBC WITH DIFFERENTIAL/PLATELET - Abnormal; Notable for the following components:      Result Value   WBC 16.2 (*)    MCV 102.3 (*)    Neutro Abs 13.3 (*)    Lymphs Abs 0.4 (*)    Monocytes Absolute 1.4 (*)    Basophils Absolute 0.2 (*)    Abs Immature Granulocytes 0.39 (*)    All other components within normal limits   BRAIN NATRIURETIC PEPTIDE - Abnormal; Notable for the following components:   B Natriuretic Peptide 315.9 (*)    All other components within normal limits  COMPREHENSIVE METABOLIC PANEL WITH GFR - Abnormal; Notable for the following components:   Potassium 5.6 (*)    Chloride 93 (*)    CO2 35 (*)    Glucose, Bld 129 (*)    BUN 7 (*)    Calcium  8.5 (*)    Albumin 3.0 (*)    Alkaline Phosphatase 145 (*)    All other components within normal limits  PROTIME-INR - Abnormal; Notable for the following components:   Prothrombin Time 26.9 (*)    INR 2.4 (*)    All other components within normal limits  I-STAT VENOUS BLOOD GAS, ED - Abnormal; Notable for the following components:   pCO2, Ven 83.8 (*)    pO2, Ven 132 (*)    Bicarbonate 40.0 (*)    TCO2 42 (*)    Acid-Base Excess 9.0 (*)    Potassium 5.5 (*)    Calcium , Ion 1.02 (*)    All other components within normal limits  I-STAT VENOUS BLOOD GAS, ED - Abnormal; Notable for the following components:   pCO2, Ven 76.8 (*)    pO2, Ven 145 (*)    Bicarbonate 39.1 (*)    TCO2 41 (*)    Acid-Base Excess 9.0 (*)    Potassium 5.4 (*)    Calcium , Ion 0.97 (*)    All other components within normal limits  TROPONIN I (HIGH SENSITIVITY) - Abnormal; Notable for the following components:   Troponin I (High Sensitivity) 23 (*)    All other components within normal limits  TROPONIN I (HIGH SENSITIVITY) - Abnormal; Notable for the following components:   Troponin I (High Sensitivity) 23 (*)    All other components within normal limits  I-STAT VENOUS BLOOD GAS, ED  I-STAT CG4 LACTIC ACID, ED    EKG: EKG Interpretation Date/Time:  Saturday December 19 2023 09:27:21 EDT Ventricular  Rate:  102 PR Interval:    QRS Duration:  149 QT Interval:  322 QTC Calculation: 420 R Axis:   100  Text Interpretation: Atrial fibrillation RBBB and LPFB Repol abnrm suggests ischemia, diffuse leads No significant change since last tracing Confirmed by  Doretha Folks ([id number]) on 12/19/2023 9:41:38 AM  Radiology: [facility] Chest Port 1 View Result Date: 12/19/2023 CLINICAL DATA:  Hypoxia and altered mental status EXAM: PORTABLE CHEST 1 VIEW COMPARISON:  12/10/2023 FINDINGS: Continued blunting of the right lateral costophrenic angle. Scarring or atelectasis at the left lung base. Mild cardiomegaly.  Bilateral airway thickening. Asymmetric hazy interstitial and patchy airspace opacity in the left lung compared to the right, possibly from asymmetric edema or pneumonia. Degenerative glenohumeral arthropathy bilaterally. Thoracic spondylosis. IMPRESSION: 1. Asymmetric hazy interstitial and patchy airspace opacity in the left lung compared to the right, possibly from asymmetric edema or pneumonia. 2. Mild cardiomegaly. 3. Continued blunting of the right lateral costophrenic angle, possibly from a small effusion or pleural thickening. 4. Scarring or atelectasis at the left lung base. 5. Degenerative glenohumeral arthropathy bilaterally. Electronically Signed   By: Ryan Salvage M.D.   On: 12/19/2023 11:03     Procedures   Medications Ordered in the ED  furosemide  (LASIX ) injection 40 mg (40 mg Intravenous Given 12/19/23 1132)                                    Medical Decision Making Amount and/or Complexity of Data Reviewed Independent Historian: EMS External Data Reviewed: notes. Labs: ordered. Decision-making details documented in ED Course. Radiology: ordered and independent interpretation performed. Decision-making details documented in ED Course. ECG/medicine tests: ordered and independent interpretation performed. Decision-making details documented in ED Course.  Risk Prescription drug management. Decision regarding hospitalization.   Pt with multiple medical problems and comorbidities and presenting today with a complaint that caries a high risk for morbidity and mortality.  Here to a with change in mental status and respiratory distress.   Upon arrival patient is lethargic on a nonrebreather.  Given his multiple medical problems and recent admission for respiratory failure as well as diuresis but was taken off of his spironolactone , torsemide  and all antibiotics he has not been on any diuretics since returning to the nursing home.  He does appear fluid overloaded at this time.  He is afebrile.  I independently interpreted patient's labs and EKG.  EKG without acute changes with frequent PVCs.  VBG with pH of 7.28, CO2 of 83 with elevated bicarb of 40.  CBC with leukocytosis of 16, hemoglobin stable at 14, normal platelet count, CMP with mild hyperkalemia 5.6 but normal creatinine and LFTs, troponin of 23, INR is therapeutic at 2.4, BNP is elevated at 315 from the 200s when he was here prior lactic acid is within normal limits.  I have independently visualized and interpreted pt's images today. Chest x-ray with evidence of fluid overload.  Radiology reports asymmetric haziness possible edema versus pneumonia. Patient was started on BiPAP due to concern for hypercarbia and respiratory failure.  After an hour and a half repeat VBG did show improvement with a CO2 now in the 70s.  Patient is still altered but is now opening his eyes more and starting to move around.  He was given IV Lasix .  Feel that patient needs admission for further care.  Consulted internal medicine for admission. CRITICAL CARE Performed by: Adalei Novell Total critical  care time: 45 minutes Critical care time was exclusive of separately billable procedures and treating other patients. Critical care was necessary to treat or prevent imminent or life-threatening deterioration. Critical care was time spent personally by me on the following activities: development of treatment plan with patient and/or surrogate as well as nursing, discussions with consultants, evaluation of patient's response to treatment, examination of patient, obtaining history from patient or surrogate,  ordering and performing treatments and interventions, ordering and review of laboratory studies, ordering and review of radiographic studies, pulse oximetry and re-evaluation of patient's condition.       Final diagnoses:  Acute on chronic respiratory failure with hypoxia and hypercapnia (HCC)  Acute on chronic congestive heart failure, unspecified heart failure type [facility])    ED Discharge Orders     None          Doretha Folks, MD 12/19/23 1301

## 2023-12-19 NOTE — H&P (Signed)
 Date: 12/19/2023               Patient Name:  James Villegas MRN: [id number]  DOB: 04-Jun-1956 Age / Sex: 68 y.o., male   PCP: Feliciano Devoria LABOR, MD         Medical Service: Internal Medicine Teaching Service         Attending Physician: Dr. Mliss Pouch      First Contact: Schuyler Novak, DO}    Second Contact: Dr. Hadassah Kristy Ahr, MD          Pager Information: First Contact Pager: [phone]   Second Contact Pager: [phone]   SUBJECTIVE   Chief Complaint: confusion, shortness of breath   History of Present Illness: James Villegas is a 68 y.o. male with PMH of L CVA with right hemiplegia, afib on coumadin , HFpEF 65 to 70%, chronic hypercarbia and lupus anticoagulant. Patient presents  via EMS from [facility] after facility noticed that patient had been less arousable. Per EMS patient was hypertensive, tachycardic and very lethargic with normal oxygen saturation. His saturations then dropped to 78% with them and he was put on a nonrebreather. Patient is on the BiPAP currently and will shake his head when asked questions, but will not speak. He is mostly aroused by sternal rub and occasionally by name. Patient was not able to answer questions. He did seem to shake his head in the negative to cough, fever, and feeling unwell but otherwise was not responsive at bedside.   Patient was discharged from this service on 7/4 after being treated for acute on chronic hypoxemic hypercarbic respiratory failure. Patient presented with altered mental status at that time too and was placed on BiPAP throughout the day and night. His mental status then improved by 7/4 and he was discharged.    ED Course: Labs significant for: pCO2 83.8 to 76.8 BNP 315.9 Troponin 23>23  Lactic 1.3 WBC 16.2  PT 26.9  INR 2.4  K 5.6>5.5>5.4  Ca ionized 1.02->.97  Glucose 129  EKG showed Aflutter RBBB, repolarization abnormalities  CXR shows asymmetric hazy interstitial and patchy airspace  opacity in the left lung. Mild cardiomegaly. Blunting of the right costophrenic angle from small pleural effusion or pleural thickening. Scarring or atelectasis  Started on BiPAP and given IV lasix    Past Medical History Past Medical History:  Diagnosis Date   Acute on chronic congestive heart failure (HCC)    Anemia, secondary    SECONDARY TO ACUTE BLOOD LOSS   Anxiety disorder    Bloody stool 08/29/2011   intermittent along with constipation.    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 IVC filter   Seizure disorder [facility])      Past Surgical History Past  Surgical History:  Procedure Laterality Date   dental extraction     multiple   vena cavogram  12/2010   INFERIOR   Meds:  Current Meds  Medication Sig   acetaminophen  (TYLENOL ) 325 MG tablet Take 650 mg by mouth every 6 (six) hours as needed for mild pain, headache or fever.   antiseptic oral rinse (BIOTENE) LIQD 1 Application by Mouth Rinse route at bedtime.   ARTIFICIAL TEAR OP Place 1 drop into both eyes in the morning, at noon, in the evening, and at bedtime.   Baclofen  5 MG TABS Take 5 mg by mouth at bedtime.   bisacodyl  (DULCOLAX) 5 MG EC tablet Take 10 mg by mouth daily as needed for moderate constipation.   Carboxymeth-Glycerin -Polysorb (REFRESH OPTIVE ADVANCED) 0.5-1-0.5 % SOLN Place 1  drop into both eyes in the morning, at noon, in the evening, and at bedtime.   cycloSPORINE  (RESTASIS ) 0.05 % ophthalmic emulsion Place 1 drop into both eyes 2 (two) times daily.   Dentifrices (BIOTENE DRY MOUTH GENTLE) PSTE Place 1 Application onto teeth at bedtime.   DULoxetine  (CYMBALTA ) 30 MG capsule Take 30 mg by mouth in the morning.   empagliflozin  (JARDIANCE ) 10 MG TABS tablet Take 10 mg by mouth daily.   ezetimibe  (ZETIA ) 10 MG tablet Take 10 mg by mouth daily.   fluticasone -salmeterol (ADVAIR) 250-50 MCG/ACT AEPB Inhale 1 puff into the lungs in the morning and at bedtime.   guaiFENesin  (ROBITUSSIN) 100 MG/5ML liquid Take 15 mLs by mouth every 4 (four) hours as needed for cough.   hydrocortisone  cream 1 % Apply 1 Application topically daily. Apply to right neck for 7 days starting on 12/18/23.   ipratropium-albuterol  (DUONEB) 0.5-2.5 (3) MG/3ML SOLN Take 3 mLs by nebulization every 4 (four) hours as needed.   omeprazole  (PRILOSEC) 20 MG capsule Take 20 mg by mouth every other day.   OXYGEN Inhale 4 L into the lungs continuous.   phenytoin  (DILANTIN  INFATABS) 50 MG tablet Chew 250 mg by mouth at bedtime.   phenytoin  (DILANTIN ) 100 MG ER capsule Take 200 mg by mouth 2 (two) times daily.   rosuvastatin  (CRESTOR ) 40 MG tablet Take 40 mg by mouth at bedtime.   senna-docusate (SENOKOT-S) 8.6-50 MG tablet Take 1 tablet by mouth at bedtime as needed for mild constipation. (Patient taking differently: Take 1 tablet by mouth daily as needed for mild constipation.)   traZODone  (DESYREL ) 50 MG tablet Take 50 mg by mouth at bedtime.   valACYclovir (VALTREX) 1000 MG tablet Take 1,000 mg by mouth 2 (two) times daily.   warfarin (COUMADIN ) 1 MG tablet Take 1 mg by mouth daily. Take along with 2.5mg  tablet for a daily dose of 3.5mg    warfarin (COUMADIN ) 2.5 MG tablet Take 2.5 mg by mouth daily.    Social:  Lives With: at assisted living facility [facility].   Occupation: Medical illustrator Support: Sister  Level of Function:Dependent in ADLs/iADLs PCP:  Feliciano Devoria LABOR, MD  Substances: -Tobacco: unable to obtain  -Alcohol : unable to obtain  -Recreational Drug: unable to obtain   Family History:  Family History  Problem Relation Age of Onset   Sleep apnea Sister      Allergies: Allergies as of 12/19/2023 - Review Complete 12/19/2023  Allergen Reaction Noted   Fenofibrate Other (See Comments) 02/18/2011    Review of Systems: A complete ROS was negative except as per HPI.   OBJECTIVE:   Physical Exam: Blood pressure 101/61, pulse 80, temperature (!)  96.8 F (36 C), temperature source Rectal, resp. rate (!) 24, SpO2 100%.  Constitutional: chronically ill appearing, lethargic,  sitting in bed with bipap mask on, lethargic  in no acute distress Eyes: conjunctiva non-erythematous Cardiovascular: regular rate and rhythm, no m/r/g Pulmonary/Chest: normal work of breathing on room air, Patient on BiPAP. Diminished breath sounds bilaterally  Abdominal: bowel sounds present  MSK: Right and left lower extremity pitting edema 1+, and right pedal edema. Pulses not palpable.  Neurological: Not able to assess A&Os, can wiggle left fingers and left toes, but cannot fingers or toes on right side Skin:legs below the knees are cool to the touch. Rash on front of neck, vesicular in some areas  Labs: CBC    Component Value Date/Time   WBC 16.2 (H) 12/19/2023 0940   RBC 4.78 12/19/2023 0940   HGB 13.9 12/19/2023 1511   HGB 13.2 08/01/2022 1019   HGB 14.1 12/27/2012 1339   HCT 41.0 12/19/2023 1511   HCT 39.2 08/01/2022 1019   HCT 41.4 12/27/2012 1339   PLT 351 12/19/2023 0940   PLT 227 08/01/2022 1019   MCV 102.3 (H) 12/19/2023 0940   MCV 92 08/01/2022 1019   MCV 89.2 12/27/2012 1339   MCH 30.8 12/19/2023 0940   MCHC 30.1 12/19/2023 0940   RDW 15.0 12/19/2023 0940   RDW 12.9 08/01/2022 1019   RDW 13.8 12/27/2012 1339   LYMPHSABS 0.4 (L)  12/19/2023 0940   LYMPHSABS 1.9 12/27/2012 1339   MONOABS 1.4 (H) 12/19/2023 0940   MONOABS 0.6 12/27/2012 1339   EOSABS 0.5 12/19/2023 0940   EOSABS 0.4 12/27/2012 1339   BASOSABS 0.2 (H) 12/19/2023 0940   BASOSABS 0.0 12/27/2012 1339     CMP     Component Value Date/Time   NA 136 12/19/2023 1511   NA 143 08/01/2022 1019   NA 139 12/27/2012 1339   K 5.1 12/19/2023 1511   K 3.8 12/27/2012 1339   CL 93 (L) 12/19/2023 0940   CL 109 (H) 04/26/2012 1453   CO2 35 (H) 12/19/2023 0940   CO2 26 12/27/2012 1339   GLUCOSE 129 (H) 12/19/2023 0940   GLUCOSE 80 12/27/2012 1339   GLUCOSE 99 04/26/2012 1453   BUN 7 (L) 12/19/2023 0940   BUN 12 08/01/2022 1019   BUN 9.4 12/27/2012 1339   CREATININE 0.63 12/19/2023 0940   CREATININE 0.7 12/27/2012 1339   CALCIUM  8.5 (L) 12/19/2023 0940   CALCIUM  8.9 12/27/2012 1339   PROT 6.8 12/19/2023 0940   PROT 6.0 07/15/2019 1055   PROT 6.6 12/27/2012 1339   ALBUMIN 3.0 (L) 12/19/2023 0940   ALBUMIN 3.6 (L) 07/15/2019 1055   ALBUMIN 3.3 (L) 12/27/2012 1339   AST 23 12/19/2023 0940   AST 19 12/27/2012 1339   ALT 18 12/19/2023 0940   ALT 34 12/27/2012 1339   ALKPHOS 145 (H) 12/19/2023 0940   ALKPHOS 122 12/27/2012 1339   BILITOT 0.7 12/19/2023 0940   BILITOT <0.2 07/15/2019 1055   BILITOT 0.20 12/27/2012 1339   GFRNONAA >60 12/19/2023 0940   GFRAA >60 02/28/2020 0435    Imaging:  DG Chest Port 1 View Result Date: 12/19/2023 CLINICAL DATA:  Hypoxia and altered mental status EXAM: PORTABLE CHEST 1 VIEW COMPARISON:  12/10/2023 FINDINGS: Continued blunting of the right lateral costophrenic angle. Scarring or atelectasis at the left lung base. Mild cardiomegaly.  Bilateral airway thickening. Asymmetric hazy interstitial and patchy airspace opacity in the left lung compared to the right, possibly from asymmetric edema  or pneumonia. Degenerative glenohumeral arthropathy bilaterally. Thoracic spondylosis. IMPRESSION: 1. Asymmetric hazy interstitial and  patchy airspace opacity in the left lung compared to the right, possibly from asymmetric edema or pneumonia. 2. Mild cardiomegaly. 3. Continued blunting of the right lateral costophrenic angle, possibly from a small effusion or pleural thickening. 4. Scarring or atelectasis at the left lung base. 5. Degenerative glenohumeral arthropathy bilaterally. Electronically Signed   By: Ryan Salvage M.D.   On: 12/19/2023 11:03      ASSESSMENT & PLAN:   Assessment & Plan by Problem: Principal Problem:   Acute on chronic hypoxic respiratory failure (HCC) Active Problems:   Acute metabolic encephalopathy   Seizure disorder (HCC)   Chronic diastolic CHF (congestive heart failure) (HCC)   Atrial fibrillation, chronic (HCC) on chronic anticoagulation   History of CVA with residual deficit   James BIRDSALL is a 68 y.o. person living with a history of afib on coumadin , seizure, hypercarbia, R CVA with hemiparesis, lupus anticoagulant  who presented with altered mental status and admitted for acute encephalopathy.  #Acute Encephalopathy  - Patient has been getting more lethargic and harder to arouse within the last day per reports from his living facility.  - In the past, patient has improved with BiPAP, but looking at pCO2, patient's baseline in the hospital has been in the 70s which the patient is currently with minimal improvement to mental status. Suspect there could also be infection component with his leukocytosis and CXR showing opacity. Will check procal and if elevated consider starting antibiotics.  - Electrolytes are within normal limits, creatinine is within normal limits - Do not suspect structural( trauma related)  or toxic etiologies - Assess phenytoin  level today   - Hold centrally acting medications at this time - Monitor mental status closely and assess for improvement with BiPAP  #Acute on Chronic Hypoxic Hypercarbic Respiratory failure  - Patient was discharged on 7/4 with the  same principal problem. He was placed on BiPAP during that admission, weaned off and mental status improved. On previous admission there was a question of presumed lung infection. Patient was given linezolid , doxycycline  and Augmentin  at time of discharge for this. On today's presentation from facility, patient's oxygen saturations dropped to 78% while with EMS, although they were initially normal, and patient had to be put on non-rebreather. Patient was also tachycardic and lethargic with them. Patient was started on BiPAP in the ED. Initial VBG with pH of 7.28, CO2 of 83 and elevated bicarb of 40. Repeat VBGs show some improvement in CO2. Patient also WBC of 16, flat trended troponin of 23. Lactic acid WNL. CXR does show blunting of right costophrenic angle which could be consistent for pericardial effusion. Also shows opacity in the left lung versus right that could be asymmetric edema vs infection  - Although patient has a history of possible BiPAP non compliance at his facility, CXR does show some opacity on the left side and could be concern for infection. Patient does have leukocytosis but has remained afebrile. From records from facility, patient seemed to have finished previous course of antibiotics.  - Check procal, if elevated consider starting antibiotics  - Resumed home inhaler  - PRN duo-nebs  - Monitor for improvement with BiPAP, hopefully to wean off as mentation improves  #Neck Rash ?Intertrigo  - facility had noted rash within the last couple of days on patient's neck and groin.  - Rash on folds of the neck and is erythematous across front of neck  extending to right under ears on both sides. Does appear vesicular in appearance at times. No pus drainage seen. -Patient seemed to have been given a dose of acyclovir at facility  - Will start nystatin  powder and see if patient benefits from this   #HFpEF 65 to 70% - Last echo was 10/21/2023 and showed EF of 65-70%. Moderate left  ventricular hypertrophy. Mild aortic regurgitation with aortic dilatation. IVC dilated  -BNP on this admission was 315.9 which is higher than baseline -Patient did receive IV lasix  in the ED, assess volume status tomorrow to determine further diuresis -Monitor BMP for electrolytes and replete as needed  -Strict I&Os  #Afib on anticoagulation Rate controlled. On chronic warfarin given hx of lupus anticoagulant.  -Warfarin per pharmacy with INR monitoring   #Epilepsy Chronic/stable. Continued home Dilantin  200 mg in AM and 250 mg in PM. Assess phenytoin  level.   #HTN Losartan and spironolactone  held during last admission and at discharge. BP normotensive. Will continue to hold at this time.    Best practice: Diet: NPO restart diet when off Bipap and mentation improves  VTE: Coumadin   IVF: none Code: Full  Disposition planning: Prior to Admission Living Arrangement: assisted living facility  Anticipated Discharge Location: assisted living facility  Barriers to Discharge: mental status improvement   Dispo: Admit patient to Observation with expected length of stay less than 2 midnights.  Signed: D'Mello, Riddick Nuon, DO Internal Medicine Resident  12/19/2023, 7:33 PM  Please contact IM Residency On-Call Pager at: [phone] or [phone].

## 2023-12-19 NOTE — Progress Notes (Signed)
 PHARMACY - ANTICOAGULATION CONSULT NOTE  Pharmacy Consult for warfarin Indication: atrial fibrillation  Allergies  Allergen Reactions   Fenofibrate Other (See Comments)    Reaction not listed on MAR     Patient Measurements:    Vital Signs: Temp: 97.1 F (36.2 C) (07/12 1523) Temp Source: Axillary (07/12 1523) BP: 120/74 (07/12 1611) Pulse Rate: 100 (07/12 1611)  Labs: Recent Labs    12/19/23 0940 12/19/23 1007 12/19/23 1140 12/19/23 1235 12/19/23 1511  HGB 14.7 James.3  --  James.0 13.9  HCT 48.9 45.0  --  44.0 41.0  PLT 351  --   --   --   --   LABPROT 26.9*  --   --   --   --   INR 2.4*  --   --   --   --   CREATININE 0.63  --   --   --   --   TROPONINIHS 23*  --  23*  --   --     Estimated Creatinine Clearance: 130.3 mL/min (by C-G formula based on SCr of 0.63 mg/dL).   Medical History: Past Medical History:  Diagnosis Date   Acute on chronic congestive heart failure (HCC)    Anemia, secondary    SECONDARY TO ACUTE BLOOD LOSS   Anxiety disorder    Bloody stool 08/29/2011   intermittent along with constipation.    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 IVC filter   Seizure disorder [facility])     Assessment: James Villegas presenting with AMS, hx of afib on warfarin PTA, last dose administered 7/11, INR on admission is therapeutic at  2.4  PTA dosing: 3.5mg  daily   Goal of Therapy:  INR 2-3 Monitor platelets by anticoagulation protocol: Yes   Plan:  Warfarin 3.5mg  PO x 1 today Daily INR, s/s bleeding  Dorn Poot, PharmD, [facility] Clinical Pharmacist ED Pharmacist Phone # [phone] 12/19/2023 5:22 PM

## 2023-12-19 NOTE — ED Notes (Signed)
 Patient less responsive to painful stimuli, beading sweat on forehead. MD notified. RT at bedside

## 2023-12-19 NOTE — ED Notes (Signed)
 MD notified of patient's rectal temp, warm blankets applied

## 2023-12-19 NOTE — ED Triage Notes (Signed)
 Patient arrives via [facility] from [facility] for AMS. Patient recently discharged on Thursday from hospital for respiratory admission. At baseline patient able to converse with others. Patient has new rash on right side of neck. Patient was hard to arouse today. Normally on 2 liters with increased respiratory effort, 78 on room air, now on NRB at 12LPM 99%. CBG 132. HR betwenn 70-120. Capno 99, RR 32, BP 126/100  Hx of stroke affecting right side.

## 2023-12-19 NOTE — Progress Notes (Signed)
 RT note. RT called to patient bedside due to desat when rolling patient. Patient sat 88% on arrival. Patient settings increased due to patient unable to maintain sats above 90. RT will continue to monitor, MD aware.    12/19/23 1134  BiPAP/CPAP/SIPAP  BiPAP/CPAP/SIPAP Pt Type Adult  BiPAP/CPAP/SIPAP SERVO  Set Rate 24 breaths/min  Respiratory Rate 0 breaths/min  IPAP (S)  20 cmH20  EPAP (S)  8 cmH2O  PEEP (S)  8 cmH20  FiO2 (%) (S)  60 %  Minute Ventilation 11.7  Leak 42  Peak Inspiratory Pressure (PIP) 19  Tidal Volume (Vt) 466  BiPAP/CPAP /SiPAP Vitals  Pulse Rate (!) 101  Resp (!) 21  SpO2 94 %  MEWS Score/Color  MEWS Score 3  MEWS Score Color Yellow

## 2023-12-19 NOTE — Progress Notes (Signed)
 RT note. Patient placed on bipap at this time per MD, Patient on the following settings, VSS. RT will continue to monitor.   12/19/23 1030  BiPAP/CPAP/SIPAP  $ Non-Invasive Ventilator  Non-Invasive Vent Set Up;Non-Invasive Vent Initial  $ Face Mask Medium Yes  BiPAP/CPAP/SIPAP Pt Type Adult  BiPAP/CPAP/SIPAP SERVO  Mask Type Full face mask  Mask Size Medium  Set Rate 20 breaths/min  Respiratory Rate 0 breaths/min  IPAP 15 cmH20  EPAP 5 cmH2O  PEEP 5 cmH20  FiO2 (%) 40 %  Minute Ventilation 10  Leak 44  Peak Inspiratory Pressure (PIP) 15  Tidal Volume (Vt) 503  Patient Home Machine No  Patient Home Mask No  Patient Home Tubing No  Auto Titrate No  Press High Alarm 20 cmH2O  CPAP/SIPAP surface wiped down Yes  Oxygen Percent 40 %  BiPAP/CPAP /SiPAP Vitals  Pulse Rate (!) 120  Resp (!) 24  SpO2 98 %  MEWS Score/Color  MEWS Score 3  MEWS Score Color Yellow

## 2023-12-19 NOTE — ED Notes (Signed)
 Admitting MD at bedside.

## 2023-12-19 NOTE — Progress Notes (Signed)
 Patient transported from ED to 3E on BiPap at 100% Fio2 without incident.

## 2023-12-19 NOTE — Hospital Course (Addendum)
 HPI yesterday facility noticed his decline, less arousable today. CO2 83. BNP, CXR  wetter. WBC 16. Normal lactate, fever. Oxygen is at 4 L   Pertinent: pCO2 83.8 to 76.8 K 5.6>5.5>5.4 >5.1 Ca ionized 1.02->.97  Alk Phos 145 BNP 315.9 Troponin 23>23  Lactic 1.3>.9 WBC 16.2  PT 26.9  INR 2.4 Glucose 129  EKG showed Aflutter RBBB, repolarization abnormalities   PMH  Afib on coumadin  Seizure Hypoxemic hypoxic respiratory failure R CVA with hemiparesis Lupus anticoagulant   Recently d/c on July 4  Found to have fluid fluid overload, diuresis and d/c abx  Torsemide  and diuretics d/c at last admission?   Medications: Acetaminophen  650 mg q6hours Artificial tears ophthalmic solution Baclofen  5 mg by mouth Bisacodyl  5 mg, take 10 mg Cyclosporine  , one drop into both eyes Dilantin  Infatabs  250 at bedtime Duloxetine  30 mg daily Ezetimibe  10 mg  Fluticasone / salmeterol 250-50 mcg/act, one puff into lungs  Guaifenesin  100 mg/ 5mL Ipratroprium-albuterol , take 3 mLs by nebulization every 4 hours needed Jardiance  10 mg daily Omeprazole  20 mg every other day Phenytoin  200 mg BID Refresh Optive advanced eye drops Rosuvastatin  40 mg  Senna-docusate 1 tablet Trazodone  50 mg at bedtime Warfarin 1 and 2.5 mg

## 2023-12-19 NOTE — ED Notes (Signed)
 Floor called by RN to ensure floor RN knew patient was on bipap. Per RN, room is not ready as bed is not in room despite room ready icon.

## 2023-12-20 DIAGNOSIS — G40909 Epilepsy, unspecified, not intractable, without status epilepticus: Secondary | ICD-10-CM | POA: Diagnosis present

## 2023-12-20 DIAGNOSIS — Z1152 Encounter for screening for COVID-19: Secondary | ICD-10-CM | POA: Diagnosis not present

## 2023-12-20 DIAGNOSIS — I5030 Unspecified diastolic (congestive) heart failure: Secondary | ICD-10-CM

## 2023-12-20 DIAGNOSIS — G9341 Metabolic encephalopathy: Secondary | ICD-10-CM | POA: Diagnosis present

## 2023-12-20 DIAGNOSIS — I5020 Unspecified systolic (congestive) heart failure: Secondary | ICD-10-CM | POA: Diagnosis not present

## 2023-12-20 DIAGNOSIS — I272 Pulmonary hypertension, unspecified: Secondary | ICD-10-CM | POA: Diagnosis present

## 2023-12-20 DIAGNOSIS — I4891 Unspecified atrial fibrillation: Secondary | ICD-10-CM

## 2023-12-20 DIAGNOSIS — J449 Chronic obstructive pulmonary disease, unspecified: Secondary | ICD-10-CM | POA: Diagnosis present

## 2023-12-20 DIAGNOSIS — L89312 Pressure ulcer of right buttock, stage 2: Secondary | ICD-10-CM | POA: Diagnosis present

## 2023-12-20 DIAGNOSIS — Z993 Dependence on wheelchair: Secondary | ICD-10-CM | POA: Diagnosis not present

## 2023-12-20 DIAGNOSIS — I4510 Unspecified right bundle-branch block: Secondary | ICD-10-CM | POA: Diagnosis present

## 2023-12-20 DIAGNOSIS — Z7901 Long term (current) use of anticoagulants: Secondary | ICD-10-CM | POA: Diagnosis not present

## 2023-12-20 DIAGNOSIS — J9622 Acute and chronic respiratory failure with hypercapnia: Secondary | ICD-10-CM | POA: Diagnosis present

## 2023-12-20 DIAGNOSIS — J9621 Acute and chronic respiratory failure with hypoxia: Secondary | ICD-10-CM | POA: Diagnosis present

## 2023-12-20 DIAGNOSIS — R471 Dysarthria and anarthria: Secondary | ICD-10-CM | POA: Diagnosis present

## 2023-12-20 DIAGNOSIS — D6862 Lupus anticoagulant syndrome: Secondary | ICD-10-CM | POA: Diagnosis present

## 2023-12-20 DIAGNOSIS — I5033 Acute on chronic diastolic (congestive) heart failure: Secondary | ICD-10-CM | POA: Diagnosis present

## 2023-12-20 DIAGNOSIS — I517 Cardiomegaly: Secondary | ICD-10-CM

## 2023-12-20 DIAGNOSIS — I4819 Other persistent atrial fibrillation: Secondary | ICD-10-CM | POA: Diagnosis present

## 2023-12-20 DIAGNOSIS — L89322 Pressure ulcer of left buttock, stage 2: Secondary | ICD-10-CM | POA: Diagnosis present

## 2023-12-20 DIAGNOSIS — I4892 Unspecified atrial flutter: Secondary | ICD-10-CM | POA: Diagnosis present

## 2023-12-20 DIAGNOSIS — I69351 Hemiplegia and hemiparesis following cerebral infarction affecting right dominant side: Secondary | ICD-10-CM | POA: Diagnosis not present

## 2023-12-20 DIAGNOSIS — I77819 Aortic ectasia, unspecified site: Secondary | ICD-10-CM | POA: Diagnosis present

## 2023-12-20 DIAGNOSIS — Z6837 Body mass index (BMI) 37.0-37.9, adult: Secondary | ICD-10-CM | POA: Diagnosis not present

## 2023-12-20 DIAGNOSIS — I110 Hypertensive heart disease with heart failure: Secondary | ICD-10-CM | POA: Diagnosis present

## 2023-12-20 DIAGNOSIS — L304 Erythema intertrigo: Secondary | ICD-10-CM | POA: Diagnosis present

## 2023-12-20 DIAGNOSIS — D72829 Elevated white blood cell count, unspecified: Secondary | ICD-10-CM | POA: Diagnosis present

## 2023-12-20 LAB — CBC
HCT: 39.7 % (ref 39.0–52.0)
Hemoglobin: 12.7 g/dL — ABNORMAL LOW (ref 13.0–17.0)
MCH: 30.9 pg (ref 26.0–34.0)
MCHC: 32 g/dL (ref 30.0–36.0)
MCV: 96.6 fL (ref 80.0–100.0)
Platelets: 310 K/uL (ref 150–400)
RBC: 4.11 MIL/uL — ABNORMAL LOW (ref 4.22–5.81)
RDW: 14.6 % (ref 11.5–15.5)
WBC: 10 K/uL (ref 4.0–10.5)
nRBC: 0 % (ref 0.0–0.2)

## 2023-12-20 LAB — BASIC METABOLIC PANEL WITH GFR
Anion gap: 15 (ref 5–15)
BUN: 9 mg/dL (ref 8–23)
CO2: 28 mmol/L (ref 22–32)
Calcium: 7.9 mg/dL — ABNORMAL LOW (ref 8.9–10.3)
Chloride: 92 mmol/L — ABNORMAL LOW (ref 98–111)
Creatinine, Ser: 0.65 mg/dL (ref 0.61–1.24)
GFR, Estimated: >60 mL/min (ref >60)
Glucose, Bld: 56 mg/dL — ABNORMAL LOW (ref 70–99)
Potassium: 5.1 mmol/L (ref 3.5–5.1)
Sodium: 135 mmol/L (ref 135–145)

## 2023-12-20 LAB — PROTIME-INR
INR: 2.8 — ABNORMAL HIGH (ref 0.8–1.2)
Prothrombin Time: 30.9 s — ABNORMAL HIGH (ref 11.4–15.2)

## 2023-12-20 LAB — GLUCOSE, CAPILLARY
Glucose-Capillary: 80 mg/dL (ref 70–99)
Glucose-Capillary: 87 mg/dL (ref 70–99)
Glucose-Capillary: 120 mg/dL — ABNORMAL HIGH (ref 70–99)

## 2023-12-20 MED ORDER — PANTOPRAZOLE SODIUM 40 MG PO TBEC
40.0000 mg | DELAYED_RELEASE_TABLET | Freq: Every day | ORAL | Status: DC
  Administered 2023-12-20 – 2023-12-23 (×4): 40 mg via ORAL
  Filled 2023-12-20 (×4): qty 1

## 2023-12-20 MED ORDER — SENNOSIDES-DOCUSATE SODIUM 8.6-50 MG PO TABS: 1.0000 | ORAL_TABLET | Freq: Every evening | ORAL | Status: DC | PRN

## 2023-12-20 MED ORDER — EMPAGLIFLOZIN 10 MG PO TABS
10.0000 mg | ORAL_TABLET | Freq: Every day | ORAL | Status: DC
  Administered 2023-12-20 – 2023-12-23 (×4): 10 mg via ORAL
  Filled 2023-12-20 (×4): qty 1

## 2023-12-20 MED ORDER — WARFARIN SODIUM 2.5 MG PO TABS: 2.5000 mg | ORAL_TABLET | Freq: Once | ORAL | Status: DC

## 2023-12-20 MED ORDER — TORSEMIDE 20 MG PO TABS
40.0000 mg | ORAL_TABLET | Freq: Every day | ORAL | Status: DC
  Administered 2023-12-20 – 2023-12-23 (×4): 40 mg via ORAL
  Filled 2023-12-20 (×4): qty 2

## 2023-12-20 MED ORDER — WARFARIN SODIUM 1 MG PO TABS
1.0000 mg | ORAL_TABLET | Freq: Once | ORAL | Status: AC
  Administered 2023-12-20: 1 mg via ORAL
  Filled 2023-12-20: qty 1

## 2023-12-20 NOTE — Progress Notes (Addendum)
 PHARMACY - ANTICOAGULATION CONSULT NOTE  Pharmacy Consult for warfarin Indication: atrial fibrillation  Allergies  Allergen Reactions   Fenofibrate Other (See Comments)    Reaction not listed on MAR     Patient Measurements:    Vital Signs: Temp: 97.1 F (36.2 C) (07/13 0734) Temp Source: Axillary (07/13 0734) BP: 107/65 (07/13 0734) Pulse Rate: 98 (07/13 0734)  Labs: Recent Labs    12/19/23 0915 12/19/23 0940 12/19/23 0940 12/19/23 1007 12/19/23 1140 12/19/23 1235 12/19/23 1511 12/20/23 0335  HGB  --  14.7   < > 15.3  --  15.0 13.9  --   HCT  --  48.9   < > 45.0  --  44.0 41.0  --   PLT  --  351  --   --   --   --   --   --   LABPROT  --  26.9*  --   --   --   --   --  30.9*  INR  --  2.4*  --   --   --   --   --  2.8*  CREATININE 0.57* 0.63  --   --   --   --   --  0.65  TROPONINIHS  --  23*  --   --  23*  --   --   --    < > = values in this interval not displayed.    Estimated Creatinine Clearance: 130.3 mL/min (by C-G formula based on SCr of 0.65 mg/dL).   Assessment: 40 YOM with medical history significant for atrial fibrillation and lupus anticoagulan on warfarin PTA who is admitted for AMS. Last dose administered 7/11, INR on admission is therapeutic at 2.4. Pharmacy consulted to manage warfarin while inpatient.   PTA dosing: 3.5mg  daily (has warfarin 1mg  and 2.5mg  tablets at home).   Warfarin dose 7/12 not given. INR within goal but trending up, will decrease PTA dose slightly to prevent supra therapeutic level.  Goal of Therapy:  INR 2-3 Monitor platelets by anticoagulation protocol: Yes   Plan:  Give warfarin 1 mg PO x1 dose Check INR daily while on warfarin Continue to monitor H&H and platelets   Thank you for allowing pharmacy to be a part of this patient's care.  Shelba Collier, PharmD, BCPS Clinical Pharmacist

## 2023-12-20 NOTE — Progress Notes (Signed)
 Spoke with primary MD last night about NPO diet order. Per MD ok to give pt sips with meds as longest he can tolerate to be off a bipip afterwards. Pt SpO2 on 3l O2 via nasal cannula 94%. PO pills given, pt tolerated well. Pt placed back on Bipap 30 minutes after pills given.

## 2023-12-20 NOTE — Care Management Obs Status (Signed)
 MEDICARE OBSERVATION STATUS NOTIFICATION   Patient Details  Name: James Villegas MRN: [id number] Date of Birth: 11/08/1955   Medicare Observation Status Notification Given:  Yes    Ahley Bulls G., RN 12/20/2023, 8:39 AM

## 2023-12-20 NOTE — Progress Notes (Signed)
 HD#0 SUBJECTIVE:  Patient Summary:James Villegas is a 68 y.o. male with PMH of L CVA with right hemiplegia, afib and lupus anticoagulant on coumadin , HFpEF (65 to 70%), OSA/OHS and chronic hypercarbia with intermittent adherence to BiPAP at [facility] facility, who presented with encephalopathy and admitted for acute hypoxic hypercarbic respiratory failure and encephalopathy, now resolved with >12 hrs of BiPAP support.   Overnight Events: None  Interim History: Doing much better this AM. Currently eating, on 6L supplemental O2. He shares with me that it has been more difficult to wear his BiPAP at the facility because the mask is tight. He does not know what happened to the mask we sent with him at last hospital discharge.  He denies cough, fever, chills, aspiration events, abdominal pain, GI distress, or URI symptoms days prior returning to the hospital. He reports he is a mouth breather and sometimes it is difficult to keep up with the nasal canula at the facility.   Main concern this morning is that he is hungry.  OBJECTIVE:  Vital Signs: Vitals:   12/20/23 0734 12/20/23 0820 12/20/23 0920 12/20/23 1109  BP: 107/65   109/60  Pulse: 98   (!) 102  Resp: 20   20  Temp: (!) 97.1 F (36.2 C)   98.6 F (37 C)  TempSrc: Axillary   Oral  SpO2: 100% 99% 96% 97%   Supplemental O2: Nasal Cannula SpO2: 97 % O2 Flow Rate (L/min): 6 L/min FiO2 (%): (S) 30 %  There were no vitals filed for this visit.   Intake/Output Summary (Last 24 hours) at 12/20/2023 1458 Last data filed at 12/20/2023 1209 Gross per 24 hour  Intake --  Output 1300 ml  Net -1300 ml   Net IO Since Admission: -1,300 mL [12/20/23 1458]  Physical Exam: Well appearing man, with large body habitus, sitting up in bed with breakfast tray, in NAD. Moist mucus membranes Irregular rhythm. No murmurs appreciated Normal work of breathing on 6L supplemental nasal cannula, decreased lung sound bilaterally, no wheezing Soft,  non tender. Protuberant. Alert and oriented, responding to questions appropriately. R hemiplegia and slight dysarthria at baseline. Mild R>L LE edema, non pitting. Warm and dry   Patient Lines/Drains/Airways Status     Active Line/Drains/Airways     Name Placement date Placement time Site Days   Peripheral IV 12/19/23 18 G Left Antecubital 12/19/23  0900  Antecubital  1   External Urinary Catheter 12/19/23  2234  --  1   Wound 12/19/23 2056 Irritant Contact Dermatitis Buttocks Posterior;Medial;Bilateral 12/19/23  2056  Buttocks  1   Wound 12/19/23 2056 Irritant Contact Dermatitis Thigh Proximal;Bilateral;Anterior 12/19/23  2056  Thigh  1   Wound 12/19/23 2056 Pressure Injury Buttocks Right Stage 2 -  Partial thickness loss of dermis presenting as a shallow open injury with a red, pink wound bed without slough. 12/19/23  2056  Buttocks  1   Wound 12/19/23 2056 Pressure Injury Buttocks Left Stage 2 -  Partial thickness loss of dermis presenting as a shallow open injury with a red, pink wound bed without slough. 12/19/23  2056  Buttocks  1            Pertinent labs and imaging:      Latest Ref Rng & Units 12/20/2023   12:02 PM 12/19/2023    3:11 PM 12/19/2023   12:35 PM  CBC  WBC 4.0 - 10.5 K/uL 10.0     Hemoglobin 13.0 - 17.0 g/dL 12.7  13.9  15.0   Hematocrit 39.0 - 52.0 % 39.7  41.0  44.0   Platelets 150 - 400 K/uL 310          Latest Ref Rng & Units 12/20/2023    3:35 AM 12/19/2023    3:11 PM 12/19/2023   12:35 PM  CMP  Glucose 70 - 99 mg/dL 56     BUN 8 - 23 mg/dL 9     Creatinine 9.38 - 1.24 mg/dL 9.34     Sodium 864 - 854 mmol/L 135  136  135   Potassium 3.5 - 5.1 mmol/L 5.1  5.1  5.4   Chloride 98 - 111 mmol/L 92     CO2 22 - 32 mmol/L 28     Calcium  8.9 - 10.3 mg/dL 7.9       No results found.   ASSESSMENT/PLAN:  Assessment: Principal Problem:   Acute on chronic hypoxic respiratory failure (HCC) Active Problems:   Acute metabolic encephalopathy   Seizure  disorder (HCC)   Chronic diastolic CHF (congestive heart failure) (HCC)   Atrial fibrillation, chronic (HCC) on chronic anticoagulation   History of CVA with residual deficit   Plan: Acute on chronic hypoxemic hypercarbic RF OHS/OSA exacerbation BiPAP non-adherence Patient doing better this AM. No productive cough or URI symptoms. Procal <0.010. CXR unchanged from prior. Persistently hypercarbic; on 6L [place] this AM. Restricted air movement with body habitus; believe this is driving recurrent hospitalizations. Will need to work with facility to address this at discharge once again. Las TTE showed a mildly elevated RVSP at 40.8, thus, pulmonary hypertension likely also contributing. Suspect intermittent BiPAP adherence and perhaps non updated BiPAP settings contributing. For now, the plan is: - 6L , likely new baseline - BiPAP at night and with naps - if recurrence du  HFpEF LVEF 65-75% 10/2023 Moderate LV hypertrophy BNP mildly elevated on admission. S/p 40 mg IV lasix  with 1.1L output. - Resumed Torsemide  40 mg daily - On Jardiance  - Holding spiro and losartan given low normal BP - Strict in/out and daily weights - Monitor K and Mg  Atrial fibrillation Hx of Lupus anticoagulant - continue warfarin per pharmacy, thank you  COPD No signs of exacerbation - Continue triple therapy equivalent  Epilepsy Phenytoin  level has been subtherapeutic. No witnessed seizure activity. - Phenytoin  200 mg AM - Phenytoin  250 mg PM   Intertrigo - Nyastatin powder - RN wound care - Will inquire about moisture wicking cloths  CVA with R-sided hemiparesis Mild dysarthria - Continue statin and ASA  Best Practice: Diet: Regular diet VTE: Warfarin per pharmacy Code: Full  Disposition planning: Family Contact: Devere Simpers, called and notified. DISPO: Anticipated discharge tomorrow to Skilled nursing facility pending clinical improvement.  Signature: Hadassah Elnora Jolynn Davene  Internal Medicine Residency  2:58 PM, 12/20/2023  On Call pager [phone]

## 2023-12-21 DIAGNOSIS — J9621 Acute and chronic respiratory failure with hypoxia: Secondary | ICD-10-CM | POA: Diagnosis not present

## 2023-12-21 DIAGNOSIS — I5033 Acute on chronic diastolic (congestive) heart failure: Secondary | ICD-10-CM | POA: Diagnosis not present

## 2023-12-21 DIAGNOSIS — J449 Chronic obstructive pulmonary disease, unspecified: Secondary | ICD-10-CM

## 2023-12-21 DIAGNOSIS — I5020 Unspecified systolic (congestive) heart failure: Secondary | ICD-10-CM

## 2023-12-21 DIAGNOSIS — J9622 Acute and chronic respiratory failure with hypercapnia: Secondary | ICD-10-CM

## 2023-12-21 LAB — CBC
HCT: 41.9 % (ref 39.0–52.0)
Hemoglobin: 13.4 g/dL (ref 13.0–17.0)
MCH: 30.4 pg (ref 26.0–34.0)
MCHC: 32 g/dL (ref 30.0–36.0)
MCV: 95 fL (ref 80.0–100.0)
Platelets: 298 K/uL (ref 150–400)
RBC: 4.41 MIL/uL (ref 4.22–5.81)
RDW: 14.8 % (ref 11.5–15.5)
WBC: 8.4 K/uL (ref 4.0–10.5)
nRBC: 0 % (ref 0.0–0.2)

## 2023-12-21 LAB — PROTIME-INR
INR: 2.3 — ABNORMAL HIGH (ref 0.8–1.2)
Prothrombin Time: 26.5 s — ABNORMAL HIGH (ref 11.4–15.2)

## 2023-12-21 LAB — COMPREHENSIVE METABOLIC PANEL WITH GFR
ALT: 15 U/L (ref 0–44)
AST: 19 U/L (ref 15–41)
Albumin: 2.6 g/dL — ABNORMAL LOW (ref 3.5–5.0)
Alkaline Phosphatase: 120 U/L (ref 38–126)
Anion gap: 10 (ref 5–15)
BUN: 9 mg/dL (ref 8–23)
CO2: 36 mmol/L — ABNORMAL HIGH (ref 22–32)
Calcium: 8.1 mg/dL — ABNORMAL LOW (ref 8.9–10.3)
Chloride: 92 mmol/L — ABNORMAL LOW (ref 98–111)
Creatinine, Ser: 0.64 mg/dL (ref 0.61–1.24)
GFR, Estimated: >60 mL/min (ref >60)
Glucose, Bld: 84 mg/dL (ref 70–99)
Potassium: 3.4 mmol/L — ABNORMAL LOW (ref 3.5–5.1)
Sodium: 138 mmol/L (ref 135–145)
Total Bilirubin: 1 mg/dL (ref 0.0–1.2)
Total Protein: 5.8 g/dL — ABNORMAL LOW (ref 6.5–8.1)

## 2023-12-21 MED ORDER — POTASSIUM CHLORIDE CRYS ER 20 MEQ PO TBCR
60.0000 meq | EXTENDED_RELEASE_TABLET | Freq: Once | ORAL | Status: AC
  Administered 2023-12-21: 60 meq via ORAL
  Filled 2023-12-21: qty 3

## 2023-12-21 MED ORDER — BUDESONIDE 0.25 MG/2ML IN SUSP
0.2500 mg | Freq: Two times a day (BID) | RESPIRATORY_TRACT | Status: DC
  Administered 2023-12-21 – 2023-12-23 (×4): 0.25 mg via RESPIRATORY_TRACT
  Filled 2023-12-21 (×4): qty 2

## 2023-12-21 MED ORDER — ALBUTEROL SULFATE (2.5 MG/3ML) 0.083% IN NEBU: 2.5000 mg | INHALATION_SOLUTION | Freq: Four times a day (QID) | RESPIRATORY_TRACT | Status: DC | PRN

## 2023-12-21 MED ORDER — WARFARIN SODIUM 3 MG PO TABS
3.0000 mg | ORAL_TABLET | Freq: Once | ORAL | Status: AC
  Administered 2023-12-21: 3 mg via ORAL
  Filled 2023-12-21: qty 1

## 2023-12-21 MED ORDER — GERHARDT'S BUTT CREAM
TOPICAL_CREAM | Freq: Two times a day (BID) | CUTANEOUS | Status: DC | PRN
  Filled 2023-12-21 (×2): qty 60

## 2023-12-21 MED ORDER — REVEFENACIN 175 MCG/3ML IN SOLN
175.0000 ug | Freq: Every day | RESPIRATORY_TRACT | Status: DC
  Administered 2023-12-22 – 2023-12-23 (×2): 175 ug via RESPIRATORY_TRACT
  Filled 2023-12-21 (×3): qty 3

## 2023-12-21 MED ORDER — ARFORMOTEROL TARTRATE 15 MCG/2ML IN NEBU
15.0000 ug | INHALATION_SOLUTION | Freq: Two times a day (BID) | RESPIRATORY_TRACT | Status: DC
  Administered 2023-12-21 – 2023-12-23 (×4): 15 ug via RESPIRATORY_TRACT
  Filled 2023-12-21 (×4): qty 2

## 2023-12-21 MED ORDER — BOOST / RESOURCE BREEZE PO LIQD CUSTOM
1.0000 | Freq: Three times a day (TID) | ORAL | Status: DC
  Administered 2023-12-21 – 2023-12-23 (×8): 1 via ORAL

## 2023-12-21 MED ORDER — GERHARDT'S BUTT CREAM
TOPICAL_CREAM | Freq: Two times a day (BID) | CUTANEOUS | Status: DC
  Filled 2023-12-21: qty 60

## 2023-12-21 NOTE — TOC Initial Note (Signed)
 Transition of Care [facility]) - Initial/Assessment Note    Patient Details  Name: James Villegas MRN: [id number] Date of Birth: 07/11/1955  Transition of Care [facility]) CM/SW Contact:    Luise JAYSON Pan, LCSWA Phone Number: 12/21/2023, 10:35 AM  Clinical Narrative:   Patient is from [facility]. CSW confirmed with facility. CSW called Devere (pts sis) and she is agreeable to patient returning to [facility] when medically ready.  Per facility, patient is on 4L O2 and not cpap.   TOC will continue to follow.   Expected Discharge Plan: Skilled Nursing Facility Barriers to Discharge: Continued Medical Work up   Patient Goals and CMS Choice Patient states their goals for this hospitalization and ongoing recovery are:: to return to [facility]          Expected Discharge Plan and Services In-house Referral: Clinical Social Work   Post Acute Care Choice: Skilled Nursing Facility Living arrangements for the past 2 months: Skilled Nursing Facility                                      Prior Living Arrangements/Services Living arrangements for the past 2 months: Skilled Nursing Facility Lives with:: Facility Resident Patient language and need for interpreter reviewed:: Yes Do you feel safe going back to the place where you live?: Yes      Need for Family Participation in Patient Care: Yes (Comment) Care giver support system in place?: Yes (comment)   Criminal Activity/Legal Involvement Pertinent to Current Situation/Hospitalization: No - Comment as needed  Activities of Daily Living      Permission Sought/Granted Permission sought to share information with : Facility Medical sales representative, Family Supports Permission granted to share information with : No (Contact information on chart ; patient is from [place])  Share Information with NAME: Doral Ventrella  Permission granted to share info w AGENCY: [facility]  Permission granted to share info w  Relationship: Sister  Permission granted to share info w Contact Information: [phone]  Emotional Assessment Appearance:: Appears stated age Attitude/Demeanor/Rapport: Unable to Assess Affect (typically observed): Unable to Assess   Alcohol  / Substance Use: Not Applicable Psych Involvement: No (comment)  Admission diagnosis:  Acute on chronic respiratory failure with hypoxia and hypercapnia (HCC) [G03.78, J96.22] Acute on chronic congestive heart failure, unspecified heart failure type (HCC) [I50.9] Acute on chronic hypoxic respiratory failure (HCC) [J96.21] Patient Active Problem List   Diagnosis Date Noted   Acute on chronic hypoxic respiratory failure (HCC) 12/19/2023   Respiratory distress 12/11/2023   Congestive heart failure (HCC) 12/11/2023   Lupus anticoagulant disorder (HCC) 12/10/2023   Chronic hypoxic respiratory failure (HCC) 12/10/2023   Acute exacerbation of CHF (congestive heart failure) (HCC) 12/06/2023   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) 12/06/2023   Mild protein-calorie malnutrition (HCC) 12/06/2023   Acute hypoxic respiratory failure (HCC) 10/20/2023   Acute respiratory failure with hypoxia and hypercarbia (HCC) 08/19/2022   History of cardioembolic cerebrovascular accident (CVA) 08/19/2022   Acute on chronic respiratory failure with hypoxia and hypercapnia (HCC) secondary to acute on chronic diastolic CHF exacerbation 09/16/2021   Hyponatremia 09/16/2021   History of DVT (deep vein thrombosis) 09/16/2021   GERD (gastroesophageal reflux disease) 09/16/2021   Aneurysm of ascending aorta (HCC)    Hypoxia    Acute on chronic heart failure with preserved ejection fraction (HCC) 07/30/2020   Acute  on chronic diastolic CHF (congestive heart failure) (HCC) 07/30/2020   Acute on chronic respiratory failure with hypercapnia (HCC) 07/30/2020   Pressure injury of skin 02/22/2020   AKI (acute kidney injury) (HCC)     HCAP (healthcare-associated pneumonia)    Acute metabolic encephalopathy    Hypercoagulable state (HCC)    Seizure disorder (HCC)    Chronic diastolic CHF (congestive heart failure) (HCC)    Atrial fibrillation, chronic (HCC) on chronic anticoagulation    History of CVA with residual deficit    Hyperlipidemia    Acute respiratory failure (HCC) 02/20/2020   CAP (community acquired pneumonia) 02/20/2020   Obesity, Class III, BMI 40-49.9 (morbid obesity) 02/20/2020   Bradycardia 02/20/2020   Hyperkalemia 02/20/2020   Hypercarbia 02/20/2020   Acute on chronic congestive heart failure (HCC)    Permanent atrial fibrillation (HCC)    Respiratory failure (HCC) 08/02/2018   Anemia of other chronic disease 11/23/2013   Pain in joint, ankle and foot 11/14/2013   Other convulsions 10/19/2013   Noninfectious gastroenteritis and colitis 07/29/2013   Left upper quadrant abdominal mass 07/28/2013   Other specified disease of white blood cells 07/16/2013   Ingrowing toenail with infection 05/20/2013   Late effects of cerebrovascular disease 10/15/2012   Pure hypercholesterolemia 10/15/2012   Essential hypertension, benign 10/15/2012   Hereditary and idiopathic peripheral neuropathy 10/15/2012   Cerebral infarction (HCC) 08/29/2011   DVT (deep venous thrombosis) (HCC) 08/29/2011   Bloody stool 08/29/2011   PFO (patent foramen ovale) 02/18/2011   PCP:  Feliciano Devoria LABOR, MD Pharmacy:   [facility] - [place], [place] - [address] [facility] [address] Portal to Registered [facility] [address] [place] [address] Phone: [phone] Fax: [phone]     Social Drivers of Health (SDOH) Social History: SDOH Screenings   Food Insecurity: Patient Unable To Answer (12/20/2023)  Housing: Patient Unable To Answer (12/20/2023)  Transportation Needs: Patient Unable To Answer (12/20/2023)  Utilities: Patient Unable To Answer (12/20/2023)  Social Connections:  Unknown (12/20/2023)  Tobacco Use: Medium Risk (12/10/2023)   SDOH Interventions:     Readmission Risk Interventions    08/26/2022   11:56 AM  Readmission Risk Prevention Plan  Transportation Screening Complete  PCP or Specialist Appt within 5-7 Days Complete  Home Care Screening Complete  Medication Review (RN CM) Referral to Pharmacy

## 2023-12-21 NOTE — Consult Note (Addendum)
 NAME:  James Villegas, MRN:  [id number], DOB:  25-Apr-1956, LOS: 1 ADMISSION DATE:  12/19/2023, CONSULTATION DATE: 12/19/23 REFERRING MD: Rosan Dayton BROCKS, DO  CHIEF COMPLAINT: resp failure   History of Present Illness:  A 68 yo M PMH hypoxic and hypercarbic respiratory failure on 4-5L O2 and CPAP, severe COPD, suspected OSA/OHS, obesity, prior CVA with residual R sided deficits, epilepsy, afib/flutter, Protein C S def, lupus anticoagulant, and chronic AC on warfarin who presented to ED 7/12 from his [facility] for AMS and acute on chronic hypoxemic hypercapnic respiratory failure due to possible noncompliance with the BiPAP and possible fluid overload due to D-CHF exacerbation (EF 65%, moderate LVH). He was admitted frequently with the same and being Rx with BiPAP and diuresis (diuresed 2.1 L in the last 2 days).  PCCM consulted today as patient is c/o small facial mask and wanting a big mask to be more compliant with the BiPAP at night.  Smoked 4 cigars daily for many yrs and quit 2010 when he was admitted for stroke.   Pertinent  Medical History   Past Medical History:  Diagnosis Date   Acute on chronic congestive heart failure (HCC)    Anemia, secondary    SECONDARY TO ACUTE BLOOD LOSS   Anxiety disorder    Bloody stool 08/29/2011   intermittent along with constipation.    CVA (cerebral vascular accident) (HCC) 2010   large left MCA stroke with right hemiparesis   Depression    DVT of lower extremity (deep venous thrombosis) (HCC)    RIGHT LOWER; s/p IVC filter 7/12   Dysphagia    Hyperlipidemia    Hypertension    Lupus anticoagulant disorder (HCC) 1990   Morbid obesity (HCC)    PFO (patent foramen ovale)    TEE 2/10: EF 60%, trivial AI, mild Ao root dilatation, mod PFO with R-L shunting, atrial septal aneurysm;   echo 7/12: EF 65-70%, grade 1 diast dysfxn, mild MR, LVOT showed severe obstruction   Protein C deficiency (HCC)    Protein S deficiency (HCC)    Rectus sheath hematoma  7/12   required reversal of anticoagulation and c/b DVT req. IVC filter   Seizure disorder (HCC)     Significant Hospital Events: Including procedures, antibiotic start and stop dates in addition to other pertinent events    Interim History / Subjective:    Objective    Blood pressure (!) 113/50, pulse 96, temperature 97.9 F (36.6 C), temperature source Oral, resp. rate 20, weight 130.6 kg, SpO2 97%.    FiO2 (%):  [30 %] 30 %   Intake/Output Summary (Last 24 hours) at 12/21/2023 1317 Last data filed at 12/21/2023 1037 Gross per 24 hour  Intake 957 ml  Output 1750 ml  Net -793 ml   Filed Weights   12/21/23 0520  Weight: 130.6 kg    Examination: General: elderly male, chronically ill appearing, no distress. 5 L [place], alert, oriented x4, and comfortable HENT: PERL, normal pharynx and oral mucosa. No LNE or thyromegaly. No JVD Lungs: symmetrical air entry bilaterally. No crackles or wheezing Cardiovascular: NL S1/S2. No m/g/r Abdomen: no distension or tenderness Extremities: +2 edema. Symmetrical   Resolved problem list   Assessment and Plan  Acute on chronic hypoxemic hypercapnic respiratory failure  Acute on chronic diastolic CHF  OSA/OHS probably Severe COPD   Plan: - Consult CM to arrange with the DME to deliver different mask sizes for the patient to chose from to get comfortable and  increase his compliance rate. - Continue CPAP (he is not BiPAP) in the NH. However, if compliance is confirmed and he continues to be hypercapnic, then switch to BiPAP. - Needs official NPSG/titration  - Probably, his technique is poor, and I switch to Nebs - SABA neb PRN - O2 to achieve SpO2 90-92%, not higher - Daily weight and monitor salt/fluid intake - BP control  - Aspiration precautions - HOB elevation   PCCM will sign off  Best Practice (right click and Reselect all SmartList Selections daily)   Per primary team  Labs   CBC: Recent Labs  Lab 12/19/23 0940  12/19/23 1007 12/19/23 1235 12/19/23 1511 12/20/23 1202 12/21/23 0314  WBC 16.2*  --   --   --  10.0 8.4  NEUTROABS 13.3*  --   --   --   --   --   HGB 14.7 15.3 15.0 13.9 12.7* 13.4  HCT 48.9 45.0 44.0 41.0 39.7 41.9  MCV 102.3*  --   --   --  96.6 95.0  PLT 351  --   --   --  310 298    Basic Metabolic Panel: Recent Labs  Lab 12/19/23 0915 12/19/23 0940 12/19/23 1007 12/19/23 1235 12/19/23 1511 12/20/23 0335 12/21/23 0314  NA 138 138 135 135 136 135 138  K 5.0 5.6* 5.5* 5.4* 5.1 5.1 3.4*  CL 95* 93*  --   --   --  92* 92*  CO2 33* 35*  --   --   --  28 36*  GLUCOSE 87 129*  --   --   --  56* 84  BUN 7* 7*  --   --   --  9 9  CREATININE 0.57* 0.63  --   --   --  0.65 0.64  CALCIUM  7.7* 8.5*  --   --   --  7.9* 8.1*   GFR: Estimated Creatinine Clearance: 130.5 mL/min (by C-G formula based on SCr of 0.64 mg/dL). Recent Labs  Lab 12/19/23 0940 12/19/23 1140 12/19/23 1235 12/19/23 1502 12/20/23 1202 12/21/23 0314  PROCALCITON  --  <0.10  --   --   --   --   WBC 16.2*  --   --   --  10.0 8.4  LATICACIDVEN  --   --  1.3 0.9  --   --     Liver Function Tests: Recent Labs  Lab 12/19/23 0940 12/21/23 0314  AST 23 19  ALT 18 15  ALKPHOS 145* 120  BILITOT 0.7 1.0  PROT 6.8 5.8*  ALBUMIN 3.0* 2.6*   No results for input(s): LIPASE, AMYLASE in the last 168 hours. No results for input(s): AMMONIA in the last 168 hours.  ABG    Component Value Date/Time   PHART 7.48 (H) 10/22/2023 1140   PCO2ART 65 (H) 10/22/2023 1140   PO2ART 123 (H) 10/22/2023 1140   HCO3 41.8 (H) 12/19/2023 1511   TCO2 44 (H) 12/19/2023 1511   O2SAT 97 12/19/2023 1511     Coagulation Profile: Recent Labs  Lab 12/19/23 0940 12/20/23 0335 12/21/23 0314  INR 2.4* 2.8* 2.3*    Cardiac Enzymes: No results for input(s): CKTOTAL, CKMB, CKMBINDEX, TROPONINI in the last 168 hours.  HbA1C: Hgb A1c MFr Bld  Date/Time Value Ref Range Status  12/16/2010 05:50 AM 5.4 <5.7  % Final    Comment:    (NOTE)  According to the ADA Clinical Practice Recommendations for 2011, when HbA1c is used as a screening test:  >=6.5%   Diagnostic of Diabetes Mellitus           (if abnormal result is confirmed) 5.7-6.4%   Increased risk of developing Diabetes Mellitus References:Diagnosis and Classification of Diabetes Mellitus,Diabetes Care,2011,34(Suppl 1):S62-S69 and Standards of Medical Care in         Diabetes - 2011,Diabetes Care,2011,34 (Suppl 1):S11-S61.  07/15/2008 01:20 PM  4.6 - 6.1 % Final   5.0 (NOTE)   The ADA recommends the following therapeutic goal for glycemic   control related to Hgb A1C measurement:   Goal of Therapy:   < 7.0% Hgb A1C   Reference: American Diabetes Association: Clinical Practice   Recommendations 2008, Diabetes Care,  2008, 31:(Suppl 1).    CBG: Recent Labs  Lab 12/20/23 0705 12/20/23 1107 12/20/23 1544  GLUCAP 80 87 120*    Review of Systems:   Unable to perform review of systems  Past Medical History:  He,  has a past medical history of Acute on chronic congestive heart failure (HCC), Anemia, secondary, Anxiety disorder, Bloody stool (08/29/2011), CVA (cerebral vascular accident) (HCC) (2010), Depression, DVT of lower extremity (deep venous thrombosis) (HCC), Dysphagia, Hyperlipidemia, Hypertension, Lupus anticoagulant disorder (HCC) (1990), Morbid obesity (HCC), PFO (patent foramen ovale), Protein C deficiency (HCC), Protein S deficiency (HCC), Rectus sheath hematoma (7/12), and Seizure disorder (HCC).   Surgical History:   Past Surgical History:  Procedure Laterality Date   dental extraction     multiple   vena cavogram  12/2010   INFERIOR     Social History:   reports that he quit smoking about 15 years ago. His smoking use included cigarettes and cigars. He started smoking about 30 years ago. He has a 15 pack-year smoking history. He has never used  smokeless tobacco. He reports that he does not drink alcohol  and does not use drugs.   Family History:  His family history includes Sleep apnea in his sister.   Allergies Allergies  Allergen Reactions   Fenofibrate Other (See Comments)    Reaction not listed on MAR      Home Medications  Prior to Admission medications   Medication Sig Start Date End Date Taking? Authorizing Provider  acetaminophen  (TYLENOL ) 325 MG tablet Take 650 mg by mouth every 6 (six) hours as needed for mild pain, headache or fever.   Yes [provider]  antiseptic oral rinse (BIOTENE) LIQD 1 Application by Mouth Rinse route at bedtime.   Yes [provider]  Baclofen  5 MG TABS Take 5 mg by mouth at bedtime. 02/19/23  Yes [provider]  bisacodyl  (DULCOLAX) 5 MG EC tablet Take 10 mg by mouth daily as needed for moderate constipation.   Yes [provider]  Carboxymeth-Glycerin -Polysorb (REFRESH OPTIVE ADVANCED) 0.5-1-0.5 % SOLN Place 1 drop into both eyes in the morning, at noon, in the evening, and at bedtime. Wait 3-5 minutes between eye medications   Yes [provider]  cycloSPORINE  (RESTASIS ) 0.05 % ophthalmic emulsion Place 1 drop into both eyes 2 (two) times daily.   Yes [provider]  DULoxetine  (CYMBALTA ) 30 MG capsule Take 30 mg by mouth in the morning. 08/31/23  Yes [provider]  empagliflozin  (JARDIANCE ) 10 MG TABS tablet Take by mouth daily.   Yes [provider]  ezetimibe  (ZETIA ) 10 MG tablet Take 10 mg by mouth daily.   Yes [provider]  fluticasone -salmeterol (  ADVAIR) 250-50 MCG/ACT AEPB Inhale 1 puff into the lungs in the morning and at bedtime.   Yes [provider]  guaiFENesin  (ROBITUSSIN) 100 MG/5ML liquid Take 15 mLs by mouth every 4 (four) hours as needed for cough.   Yes [provider]  ipratropium-albuterol  (DUONEB) 0.5-2.5 (3) MG/3ML SOLN Take 3 mLs by nebulization every 4 (four) hours  as needed. 10/23/23  Yes Elnora Ip, MD  omeprazole  (PRILOSEC) 20 MG capsule Take 20 mg by mouth every other day. 08/01/21  Yes [provider]  phenytoin  (DILANTIN  INFATABS) 50 MG tablet Chew 50 mg by mouth at bedtime. Give with 200mg  capsules for total dose of 250mg  qhs   Yes [provider]  phenytoin  (DILANTIN ) 100 MG ER capsule Take 200 mg by mouth 2 (two) times daily.   Yes [provider]  polyvinyl alcohol  (LIQUIFILM TEARS) 1.4 % ophthalmic solution Place 1 drop into both eyes 4 (four) times daily. 10/23/23  Yes Elnora Ip, MD  potassium chloride  SA (KLOR-CON ) 20 MEQ tablet Take 2 tablets (40 mEq total) by mouth daily. 08/08/20  Yes Cheryle Page, MD  rosuvastatin  (CRESTOR ) 40 MG tablet Take 40 mg by mouth at bedtime.   Yes [provider]  spironolactone  (ALDACTONE ) 25 MG tablet Take 1 tablet (25 mg total) by mouth daily. 09/22/21 12/06/23 Yes Shahmehdi, Adriana LABOR, MD  torsemide  (DEMADEX ) 20 MG tablet Take 40 mg by mouth in the morning and at bedtime. 08/31/23  Yes [provider]  traZODone  (DESYREL ) 50 MG tablet Take 25 mg by mouth at bedtime. 08/01/21  Yes [provider]  warfarin (COUMADIN ) 1 MG tablet Take 3.5 mg by mouth daily.   Yes [provider]  losartan (COZAAR) 25 MG tablet Take 12.5 mg by mouth daily. Patient not taking: Reported on 12/06/2023    [provider]  OXYGEN Inhale 4 L into the lungs continuous.    [provider]    Thank you  Mancel Ply, MD, [facility] Office: [phone] See Amion for personal pager PCCM on call pager [phone] until 7pm. Please call Elink 7p-7a. [phone]

## 2023-12-21 NOTE — Progress Notes (Signed)
 HD#1 SUBJECTIVE:  Patient Summary:James Villegas is a 68 y.o. male with PMH of L CVA with right hemiplegia, afib and lupus anticoagulant on coumadin , HFpEF (65 to 70%), OSA/OHS and chronic hypercarbia with intermittent adherence to BiPAP at [facility] facility, who presented with encephalopathy and admitted for acute hypoxic hypercarbic respiratory failure and encephalopathy, now resolved with >12 hrs of BiPAP support.   Overnight Events: None  Interim History: Pt seen and examined at the bedside this morning. He states that he is feeling much better and that his oxygen has been better so he would like to go home. We did have a discussion about ensuring he has the right equipment at his facility before he can safely be discharged. He verbalized understanding and emphasized that he would like to go back so that he can get into his wheelchair instead of being bed bound.   OBJECTIVE:  Vital Signs: Vitals:   12/21/23 0520 12/21/23 0719 12/21/23 0748 12/21/23 1036  BP: 97/60 112/62  (!) 113/50  Pulse: 98 99  96  Resp: 20 19  20   Temp: 98.6 F (37 C) 98.7 F (37.1 C)  97.9 F (36.6 C)  TempSrc: Oral Oral  Oral  SpO2: 98% 98% 95% 97%  Weight: 130.6 kg      Supplemental O2: Nasal Cannula SpO2: 97 % O2 Flow Rate (L/min): 6 L/min FiO2 (%): 30 %  Filed Weights   12/21/23 0520  Weight: 130.6 kg     Intake/Output Summary (Last 24 hours) at 12/21/2023 1245 Last data filed at 12/21/2023 1037 Gross per 24 hour  Intake 957 ml  Output 1750 ml  Net -793 ml   Net IO Since Admission: -2,093 mL [12/21/23 1245]  Physical Exam: Well appearing man, with large body habitus, sitting up in bed with breakfast tray, in NAD. Moist mucus membranes Irregular rhythm. No murmurs appreciated Normal work of breathing on 6L supplemental nasal cannula, decreased lung sound bilaterally, no wheezing Soft, non tender. Protuberant. Alert and oriented, responding to questions appropriately. R hemiplegia and  slight dysarthria at baseline. Mild R>L LE edema, non pitting. Warm and dry   Patient Lines/Drains/Airways Status     Active Line/Drains/Airways     Name Placement date Placement time Site Days   Peripheral IV 12/19/23 18 G Left Antecubital 12/19/23  0900  Antecubital  1   External Urinary Catheter 12/19/23  2234  --  1   Wound 12/19/23 2056 Irritant Contact Dermatitis Buttocks Posterior;Medial;Bilateral 12/19/23  2056  Buttocks  1   Wound 12/19/23 2056 Irritant Contact Dermatitis Thigh Proximal;Bilateral;Anterior 12/19/23  2056  Thigh  1   Wound 12/19/23 2056 Pressure Injury Buttocks Right Stage 2 -  Partial thickness loss of dermis presenting as a shallow open injury with a red, pink wound bed without slough. 12/19/23  2056  Buttocks  1   Wound 12/19/23 2056 Pressure Injury Buttocks Left Stage 2 -  Partial thickness loss of dermis presenting as a shallow open injury with a red, pink wound bed without slough. 12/19/23  2056  Buttocks  1            Pertinent labs and imaging:      Latest Ref Rng & Units 12/21/2023    3:14 AM 12/20/2023   12:02 PM 12/19/2023    3:11 PM  CBC  WBC 4.0 - 10.5 K/uL 8.4  10.0    Hemoglobin 13.0 - 17.0 g/dL 86.5  87.2  86.0   Hematocrit 39.0 - 52.0 % 41.9  39.7  41.0   Platelets 150 - 400 K/uL 298  310         Latest Ref Rng & Units 12/21/2023    3:14 AM 12/20/2023    3:35 AM 12/19/2023    3:11 PM  CMP  Glucose 70 - 99 mg/dL 84  56    BUN 8 - 23 mg/dL 9  9    Creatinine 9.38 - 1.24 mg/dL 9.35  9.34    Sodium 864 - 145 mmol/L 138  135  136   Potassium 3.5 - 5.1 mmol/L 3.4  5.1  5.1   Chloride 98 - 111 mmol/L 92  92    CO2 22 - 32 mmol/L 36  28    Calcium  8.9 - 10.3 mg/dL 8.1  7.9    Total Protein 6.5 - 8.1 g/dL 5.8     Total Bilirubin 0.0 - 1.2 mg/dL 1.0     Alkaline Phos 38 - 126 U/L 120     AST 15 - 41 U/L 19     ALT 0 - 44 U/L 15       No results found.   ASSESSMENT/PLAN:  Assessment: Principal Problem:   Acute on chronic hypoxic  respiratory failure (HCC) Active Problems:   Seizure disorder (HCC)   Chronic diastolic CHF (congestive heart failure) (HCC)   Atrial fibrillation, chronic (HCC) on chronic anticoagulation   History of CVA withR sided hemiparesis and mild dysarthria   Lupus anticoagulant disorder (HCC)   Plan: Acute on chronic hypoxemic hypercarbic RF OHS/OSA exacerbation BiPAP non-adherence Pt continuing to do better this morning. Low suspicion for pneumonia due to lack of  productive cough or URI symptoms, Procal <0.010, and CXR unchanged from prior. 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 Last TTE showed a mildly elevated RVSP at 40.8, thus, pulmonary hypertension likely also contributing. -Discussed case with his sister who stated that she believes he has a CPAP and not a BiPAP at his facility. He will need a BiPAP due to poor ventilation and chronic hypercapnia. Will contact facility and work to ensure pt has BiPAP -Continue 6LNC and BiPAP  -Attempted to contact facility this morning. Will try again this afternoon.  HFpEF LVEF 65-75% 10/2023 Moderate LV hypertrophy BNP mildly elevated on admission. S/p 40 mg IV lasix  with 1.1L output. - Resumed Torsemide  40 mg daily - On Jardiance  - Holding spiro and losartan given low normal BP - Strict in/out and daily weights - Monitor K and Mg  Atrial fibrillation Hx of Lupus anticoagulant - continue warfarin per pharmacy, thank you  COPD No signs of exacerbation - Continue triple therapy equivalent  Epilepsy Phenytoin  level has been subtherapeutic. No witnessed seizure activity. - Phenytoin  200 mg AM - Phenytoin  250 mg PM   Intertrigo - Nyastatin powder - RN wound care - Will inquire about moisture wicking cloths  CVA with R-sided hemiparesis Mild dysarthria - Continue statin and ASA  Best Practice: Diet: Regular diet VTE: Warfarin per pharmacy Code: Full  Disposition planning: Family Contact:  Devere Simpers, called and notified. DISPO: Anticipated discharge tomorrow to Skilled nursing facility pending clinical improvement.  Signature: Schuyler Novak, DO [facility]  12:45 PM, 12/21/2023  On Call pager [phone]

## 2023-12-21 NOTE — Progress Notes (Signed)
 PHARMACY - ANTICOAGULATION CONSULT NOTE  Pharmacy Consult for warfarin Indication: atrial fibrillation  Allergies  Allergen Reactions   Fenofibrate Other (See Comments)    Reaction not listed on MAR     Patient Measurements: Weight: 130.6 kg (287 lb 14.7 oz)  Vital Signs: Temp: 98.6 F (37 C) (07/14 0520) Temp Source: Oral (07/14 0520) BP: 97/60 (07/14 0520) Pulse Rate: 98 (07/14 0520)  Labs: Recent Labs    12/19/23 0940 12/19/23 1007 12/19/23 1140 12/19/23 1235 12/19/23 1511 12/20/23 0335 12/20/23 1202 12/21/23 0314  HGB 14.7   < >  --    < > 13.9  --  12.7* 13.4  HCT 48.9   < >  --    < > 41.0  --  39.7 41.9  PLT 351  --   --   --   --   --  310 298  LABPROT 26.9*  --   --   --   --  30.9*  --  26.5*  INR 2.4*  --   --   --   --  2.8*  --  2.3*  CREATININE 0.63  --   --   --   --  0.65  --  0.64  TROPONINIHS 23*  --  23*  --   --   --   --   --    < > = values in this interval not displayed.    Estimated Creatinine Clearance: 130.5 mL/min (by C-G formula based on SCr of 0.64 mg/dL).   Assessment: 57 YOM with medical history significant for atrial fibrillation and lupus anticoagulan on warfarin PTA who is admitted for AMS. Last dose administered 7/11, INR on admission is therapeutic at 2.4. Pharmacy consulted to manage warfarin while inpatient.   PTA dosing: 3.5mg  daily (has warfarin 1mg  and 2.5mg  tablets at home).   Warfarin dose 7/12 not given. INR within goal but trending up, will decrease PTA dose slightly to prevent supra therapeutic level.  7/14 AM update: INR 2.3 Hgb / PLT wnl  Goal of Therapy:  INR 2-3 Monitor platelets by anticoagulation protocol: Yes   Plan:  Give warfarin 3 mg PO x1 dose Check INR daily while on warfarin Continue to monitor H&H and platelets   Thank you for allowing pharmacy to be a part of this patient's care.  Benedetta Heath BS, PharmD, BCPS Clinical Pharmacist 12/21/2023 6:31 AM  Contact: [phone] after 3  PM  Be curious, not judgmental... -Davina Sprinkles

## 2023-12-21 NOTE — Plan of Care (Signed)

## 2023-12-22 DIAGNOSIS — L89302 Pressure ulcer of unspecified buttock, stage 2: Secondary | ICD-10-CM

## 2023-12-22 LAB — BASIC METABOLIC PANEL WITH GFR
Anion gap: 11 (ref 5–15)
BUN: 6 mg/dL — ABNORMAL LOW (ref 8–23)
CO2: 36 mmol/L — ABNORMAL HIGH (ref 22–32)
Calcium: 7.9 mg/dL — ABNORMAL LOW (ref 8.9–10.3)
Chloride: 93 mmol/L — ABNORMAL LOW (ref 98–111)
Creatinine, Ser: 0.49 mg/dL — ABNORMAL LOW (ref 0.61–1.24)
GFR, Estimated: >60 mL/min (ref >60)
Glucose, Bld: 85 mg/dL (ref 70–99)
Potassium: 4 mmol/L (ref 3.5–5.1)
Sodium: 140 mmol/L (ref 135–145)

## 2023-12-22 LAB — PROTIME-INR
INR: 1.9 — ABNORMAL HIGH (ref 0.8–1.2)
Prothrombin Time: 23.2 s — ABNORMAL HIGH (ref 11.4–15.2)

## 2023-12-22 MED ORDER — WARFARIN SODIUM 3 MG PO TABS
3.5000 mg | ORAL_TABLET | Freq: Once | ORAL | Status: AC
  Administered 2023-12-22: 3.5 mg via ORAL
  Filled 2023-12-22: qty 1

## 2023-12-22 NOTE — TOC Progression Note (Addendum)
 Transition of Care [facility]) - Progression Note    Patient Details  Name: James Villegas MRN: [id number] Date of Birth: 12/10/1955  [facility] of [facility]) CM/SW Contact  Luise JAYSON Pan, [place] Phone Number: 12/22/2023, 9:27 AM  Clinical Narrative:   CSW notified [facility] of patients CPAP/bipap needs.  2:54 PM CSW followed up with [facility] about cpap/bipap being ordered for patient. Facility confirmed they have ordered one at this time.   TOC will continue to follow.    Expected Discharge Plan: Skilled Nursing Facility Barriers to Discharge: Continued Medical Work up  Expected Discharge Plan and Services In-house Referral: Clinical Social Work   Post Acute Care Choice: Skilled Nursing Facility Living arrangements for the past 2 months: Skilled Nursing Facility                                       Social Determinants of Health (SDOH) Interventions SDOH Screenings   Food Insecurity: Patient Unable To Answer (12/20/2023)  Housing: Patient Unable To Answer (12/20/2023)  Transportation Needs: Patient Unable To Answer (12/20/2023)  Utilities: Patient Unable To Answer (12/20/2023)  Social Connections: Unknown (12/20/2023)  Tobacco Use: Medium Risk (12/10/2023)    Readmission Risk Interventions    08/26/2022   11:56 AM  Readmission Risk Prevention Plan  Transportation Screening Complete  PCP or Specialist Appt within 5-7 Days Complete  Home Care Screening Complete  Medication Review (RN CM) Referral to Pharmacy

## 2023-12-22 NOTE — Progress Notes (Signed)
 HD#2 SUBJECTIVE:  Patient Summary:James Villegas is a 68 y.o. male with PMH of L CVA with right hemiplegia, afib and lupus anticoagulant on coumadin , HFpEF (65 to 70%), OSA/OHS and chronic hypercarbia with intermittent adherence to BiPAP at [facility] facility, who presented with encephalopathy and admitted for acute hypoxic hypercarbic respiratory failure and encephalopathy, now resolved with >12 hrs of BiPAP support.   Overnight Events: None  Interim History: Pt seen and examined at the bedside this morning. He states that he is feeling sleepy this morning. He also admits that he did not wear his BiPAP all bight last night because it continues to dry out his mouth and it is very uncomfortable for him because the mask is tight. Discussed with the patient again the importance of wearing his BiPAP to prevent recurrent hospitalizations and even death. He verbalized understanding. All other questions and concerns were addressed at this time.    OBJECTIVE:  Vital Signs: Vitals:   12/22/23 0358 12/22/23 0807 12/22/23 0833 12/22/23 1148  BP: 125/60 (!) 101/51  (!) 106/55  Pulse: 91 96  91  Resp: 18 20  20   Temp: 98.9 F (37.2 C) 98.4 F (36.9 C)  98.9 F (37.2 C)  TempSrc: Oral Oral  Oral  SpO2: 99% 98% 95% 97%  Weight: 128.1 kg      Supplemental O2: Nasal Cannula SpO2: 97 % O2 Flow Rate (L/min): 5 L/min FiO2 (%): 30 %  Filed Weights   12/21/23 0520 12/22/23 0358  Weight: 130.6 kg 128.1 kg     Intake/Output Summary (Last 24 hours) at 12/22/2023 1442 Last data filed at 12/22/2023 1218 Gross per 24 hour  Intake 1371 ml  Output 4300 ml  Net -2929 ml   Net IO Since Admission: -5,571 mL [12/22/23 1442]  Physical Exam: Const: Awake, alert in NAD HENT: Normocephalic, atraumatic, mucus membranes moist Card: RRR, Slight pitting edema to midshin BLE Resp: LCTAB, large respirations with increasing expiratory phase.  Abd: Soft, NTND, Bsx4 Extremities: Warm, pink. Pitting edema  bilaterally R.L. Slight red discoloration in RLE, likely stasis.  Neuro: 0/5 MS in RUE and RLE    Patient Lines/Drains/Airways Status     Active Line/Drains/Airways     Name Placement date Placement time Site Days   Peripheral IV 12/19/23 18 G Left Antecubital 12/19/23  0900  Antecubital  1   External Urinary Catheter 12/19/23  2234  --  1   Wound 12/19/23 2056 Irritant Contact Dermatitis Buttocks Posterior;Medial;Bilateral 12/19/23  2056  Buttocks  1   Wound 12/19/23 2056 Irritant Contact Dermatitis Thigh Proximal;Bilateral;Anterior 12/19/23  2056  Thigh  1   Wound 12/19/23 2056 Pressure Injury Buttocks Right Stage 2 -  Partial thickness loss of dermis presenting as a shallow open injury with a red, pink wound bed without slough. 12/19/23  2056  Buttocks  1   Wound 12/19/23 2056 Pressure Injury Buttocks Left Stage 2 -  Partial thickness loss of dermis presenting as a shallow open injury with a red, pink wound bed without slough. 12/19/23  2056  Buttocks  1            Pertinent labs and imaging:      Latest Ref Rng & Units 12/21/2023    3:14 AM 12/20/2023   12:02 PM 12/19/2023    3:11 PM  CBC  WBC 4.0 - 10.5 K/uL 8.4  10.0    Hemoglobin 13.0 - 17.0 g/dL 86.5  87.2  86.0   Hematocrit 39.0 - 52.0 %  41.9  39.7  41.0   Platelets 150 - 400 K/uL 298  310         Latest Ref Rng & Units 12/22/2023    2:27 AM 12/21/2023    3:14 AM 12/20/2023    3:35 AM  CMP  Glucose 70 - 99 mg/dL 85  84  56   BUN 8 - 23 mg/dL 6  9  9    Creatinine 0.61 - 1.24 mg/dL 9.50  9.35  9.34   Sodium 135 - 145 mmol/L 140  138  135   Potassium 3.5 - 5.1 mmol/L 4.0  3.4  5.1   Chloride 98 - 111 mmol/L 93  92  92   CO2 22 - 32 mmol/L 36  36  28   Calcium  8.9 - 10.3 mg/dL 7.9  8.1  7.9   Total Protein 6.5 - 8.1 g/dL  5.8    Total Bilirubin 0.0 - 1.2 mg/dL  1.0    Alkaline Phos 38 - 126 U/L  120    AST 15 - 41 U/L  19    ALT 0 - 44 U/L  15      No results found.   ASSESSMENT/PLAN:   Assessment: Principal Problem:   Acute on chronic hypoxic respiratory failure (HCC) Active Problems:   Seizure disorder (HCC)   Chronic diastolic CHF (congestive heart failure) (HCC)   Atrial fibrillation, chronic (HCC) on chronic anticoagulation   History of CVA withR sided hemiparesis and mild dysarthria   Lupus anticoagulant disorder (HCC)   Pressure injury of buttock, stage 2 (HCC) - present on admission   Morbid obesity due to BMO of 37 with related chronic HTN, GERD, CHF, and OHS   Plan: Acute on chronic hypoxemic hypercarbic RF OHS/OSA exacerbation BiPAP non-adherence Suspect this is OHS 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 Last TTE showed a mildly elevated RVSP at 40.8, thus, pulmonary hypertension likely also contributing. -There is concern this morning as he is feeling drowsy again which is often the beginning of these cycles for him and he is admitting to taking his BiPAP off at night. -Discussed with the patient again the importance of adhering to wearing it to prevent recurrent hospitalizations and death.   -Continue 6LNC and BiPAP with naps and nighttime.  -Discussed with social work this afternoon who stated that the patient does not have a BiPAP at the facility and wears only 4L [place]. The facility has ordered one and we will need confirmation of its arrival before he can be safely discharged.   HFpEF LVEF 65-75% 10/2023 Moderate LV hypertrophy BNP mildly elevated on admission. S/p 40 mg IV lasix  with 1.1L output. - Resumed Torsemide  40 mg daily - On Jardiance  - Holding spiro and losartan given low normal BP - Strict in/out and daily weights - Monitor K and Mg  Atrial fibrillation Hx of Lupus anticoagulant -INR subtherapeutic today at 1.9.  -continue warfarin per pharmacy, thank you  COPD No signs of exacerbation - Continue triple therapy equivalent  Stage 2 pressure ulcer right and left buttocks, present on admission  -Likely due to  impaired mobility s/p stroke and decreased pressure offloading. Will initiate and gerbert's butt cream and pressure offloading including q2 turns   Epilepsy Phenytoin  level has been subtherapeutic. No witnessed seizure activity. - Phenytoin  200 mg AM - Phenytoin  250 mg PM   Intertrigo -Improving  - Nyastatin powder - RN wound care  CVA with R-sided hemiparesis Mild dysarthria - Continue statin and ASA  Best Practice: Diet: Regular diet VTE: Warfarin per pharmacy Code: Full  Disposition planning: Family Contact: Devere Simpers, called and notified. DISPO: Anticipated discharge tomorrow to Skilled nursing facility pending clinical improvement.  Signature: Schuyler Novak, DO [facility]  2:42 PM, 12/22/2023  On Call pager [phone]

## 2023-12-22 NOTE — Progress Notes (Signed)
 PHARMACY - ANTICOAGULATION CONSULT NOTE  Pharmacy Consult for warfarin Indication: atrial fibrillation  Allergies  Allergen Reactions   Fenofibrate Other (See Comments)    Reaction not listed on MAR     Patient Measurements: Weight: 128.1 kg (282 lb 6.6 oz)  Vital Signs: Temp: 98.9 F (37.2 C) (07/15 0358) Temp Source: Oral (07/15 0358) BP: 125/60 (07/15 0358) Pulse Rate: 91 (07/15 0358)  Labs: Recent Labs    12/19/23 0940 12/19/23 1007 12/19/23 1140 12/19/23 1235 12/19/23 1511 12/20/23 0335 12/20/23 1202 12/21/23 0314 12/22/23 0227  HGB 14.7   < >  --    < > 13.9  --  12.7* 13.4  --   HCT 48.9   < >  --    < > 41.0  --  39.7 41.9  --   PLT 351  --   --   --   --   --  310 298  --   LABPROT 26.9*  --   --   --   --  30.9*  --  26.5* 23.2*  INR 2.4*  --   --   --   --  2.8*  --  2.3* 1.9*  CREATININE 0.63  --   --   --   --  0.65  --  0.64 0.49*  TROPONINIHS 23*  --  23*  --   --   --   --   --   --    < > = values in this interval not displayed.    Estimated Creatinine Clearance: 129.2 mL/min (A) (by C-G formula based on SCr of 0.49 mg/dL (L)).   Assessment: 21 YOM with medical history significant for atrial fibrillation and lupus anticoagulan on warfarin PTA who is admitted for AMS. Last dose administered 7/11, INR on admission is therapeutic at 2.4. Pharmacy consulted to manage warfarin while inpatient.   PTA dosing: 3.5mg  daily (has warfarin 1mg  and 2.5mg  tablets at home).   Warfarin dose 7/12 not given. INR within goal but trending up, will decrease PTA dose slightly to prevent supra therapeutic level.  7/15 AM update: INR 1.9 Hgb / PLT wnl  Goal of Therapy:  INR 2-3 Monitor platelets by anticoagulation protocol: Yes   Plan:  Give warfarin 3.5 mg PO x1 dose Check INR daily while on warfarin Continue to monitor H&H and platelets   Thank you for allowing pharmacy to be a part of this patient's care.  Benedetta Heath BS, PharmD, BCPS Clinical  Pharmacist 12/22/2023 7:10 AM  Contact: [phone] after 3 PM  Be curious, not judgmental... -Davina Sprinkles

## 2023-12-22 NOTE — [place] FL2 (Signed)
 [place]  MEDICAID FL2 LEVEL OF CARE FORM     IDENTIFICATION  Patient Name: James Villegas Birthdate: [birth year] Sex: male Admission Date (Current Location): 12/19/2023  [facility] and [place] Number:  Producer, television/film/video and Address:  The . [facility], [address] [place], [place] [address]      Provider Number: [id number]  Attending Physician Name and Address:  Rosan Dayton BROCKS, DO  Relative Name and Phone Number:  James Villegas; Sister; [phone]    Current Level of Care: Hospital Recommended Level of Care: Skilled Nursing Facility Prior Approval Number:    Date Approved/Denied:   PASRR Number: [id number]  Discharge Plan: SNF    Current Diagnoses: Patient Active Problem List   Diagnosis Date Noted   Acute on chronic hypoxic respiratory failure (HCC) 12/19/2023   Respiratory distress 12/11/2023   Congestive heart failure (HCC) 12/11/2023   Lupus anticoagulant disorder (HCC) 12/10/2023   Chronic hypoxic respiratory failure (HCC) 12/10/2023   Acute exacerbation of CHF (congestive heart failure) (HCC) 12/06/2023   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) 12/06/2023   Mild protein-calorie malnutrition (HCC) 12/06/2023   Acute hypoxic respiratory failure (HCC) 10/20/2023   Acute respiratory failure with hypoxia and hypercarbia (HCC) 08/19/2022   History of cardioembolic cerebrovascular accident (CVA) 08/19/2022   Acute on chronic respiratory failure with hypoxia and hypercapnia (HCC) secondary to acute on chronic diastolic CHF exacerbation 09/16/2021   Hyponatremia 09/16/2021   History of DVT (deep vein thrombosis) 09/16/2021   GERD (gastroesophageal reflux disease) 09/16/2021   Aneurysm of ascending aorta (HCC)    Hypoxia    Acute on chronic heart failure with preserved ejection fraction (HCC) 07/30/2020   Acute on chronic diastolic CHF (congestive heart failure) (HCC) 07/30/2020   Acute on  chronic respiratory failure with hypercapnia (HCC) 07/30/2020   Pressure injury of skin 02/22/2020   AKI (acute kidney injury) (HCC)    HCAP (healthcare-associated pneumonia)    Hypercoagulable state (HCC)    Seizure disorder (HCC)    Chronic diastolic CHF (congestive heart failure) (HCC)    Atrial fibrillation, chronic (HCC) on chronic anticoagulation    History of CVA withR sided hemiparesis and mild dysarthria    Hyperlipidemia    Acute respiratory failure (HCC) 02/20/2020   CAP (community acquired pneumonia) 02/20/2020   Obesity, Class III, BMI 40-49.9 (morbid obesity) 02/20/2020   Bradycardia 02/20/2020   Hyperkalemia 02/20/2020   Acute on chronic hypercarbia 02/20/2020   Acute on chronic congestive heart failure (HCC)    Permanent atrial fibrillation (HCC)    Respiratory failure (HCC) 08/02/2018   Anemia of other chronic disease 11/23/2013   Pain in joint, ankle and foot 11/14/2013   Other convulsions 10/19/2013   Noninfectious gastroenteritis and colitis 07/29/2013   Left upper quadrant abdominal mass 07/28/2013   Other specified disease of white blood cells 07/16/2013   Ingrowing toenail with infection 05/20/2013   Late effects of cerebrovascular disease 10/15/2012   Pure hypercholesterolemia 10/15/2012   Essential hypertension, benign 10/15/2012   Hereditary and idiopathic peripheral neuropathy 10/15/2012   Cerebral infarction (HCC) 08/29/2011   DVT (deep venous thrombosis) (HCC) 08/29/2011   Bloody stool 08/29/2011   PFO (patent foramen ovale) 02/18/2011    Orientation RESPIRATION BLADDER Height & Weight     Self, Time, Place  O2 (5L O2) Continent, External catheter Weight: 282 lb 6.6 oz (128.1 kg) Height:     BEHAVIORAL SYMPTOMS/MOOD NEUROLOGICAL BOWEL NUTRITION STATUS  Continent Diet (see dc summary)  AMBULATORY STATUS COMMUNICATION OF NEEDS Skin     Verbally PU Stage and Appropriate Care, Other (Comment) (Pressure Injury Buttocks Left Stage 2; Pressure  Injury Buttocks Right Stage 2; Wound Irritant Contact Dermatitis Thigh Proximal, Bilateral, Anterior; Would Irritant Contact Dermatitis Buttocks Posterior, Medial, Bilateral)                       Personal Care Assistance Level of Assistance              Functional Limitations Info  Sight, Hearing, Speech Sight Info: Impaired (eyeglasses) Hearing Info: Adequate Speech Info: Adequate    SPECIAL CARE FACTORS FREQUENCY                       Contractures Contractures Info: Not present    Additional Factors Info  Code Status, Allergies, Isolation Precautions Code Status Info: Full Allergies Info: Fenofibrate     Isolation Precautions Info: Contact Precautions (ESBL, MRSA)     Current Medications (12/22/2023):  This is the current hospital active medication list Current Facility-Administered Medications  Medication Dose Route Frequency Provider Last Rate Last Admin   albuterol  (PROVENTIL ) (2.5 MG/3ML) 0.083% nebulizer solution 2.5 mg  2.5 mg Nebulization Q6H PRN Albustami, Omar M, MD       arformoterol  (BROVANA ) nebulizer solution 15 mcg  15 mcg Nebulization BID Albustami, Omar M, MD   15 mcg at 12/22/23 9170   budesonide  (PULMICORT ) nebulizer solution 0.25 mg  0.25 mg Nebulization BID Albustami, Omar M, MD   0.25 mg at 12/22/23 9170   cycloSPORINE  (RESTASIS ) 0.05 % ophthalmic emulsion 1 drop  1 drop Both Eyes BID Zheng, Michael, DO   1 drop at 12/22/23 0932   DULoxetine  (CYMBALTA ) DR capsule 30 mg  30 mg Oral q AM Zheng, Michael, DO   30 mg at 12/22/23 9068   empagliflozin  (JARDIANCE ) tablet 10 mg  10 mg Oral Daily Gomez-Caraballo, Maria, MD   10 mg at 12/22/23 0931   feeding supplement (BOOST / RESOURCE BREEZE) liquid 1 Container  1 Container Oral TID BM Myrna Bitters, DO   1 Container at 12/22/23 0932   Gerhardt's butt cream   Topical BID PRN Rosan Dayton BROCKS, DO       nystatin  (MYCOSTATIN /NYSTOP ) topical powder   Topical TID Zheng, Michael, DO   Given at 12/22/23 0932    pantoprazole  (PROTONIX ) EC tablet 40 mg  40 mg Oral Daily Gomez-Caraballo, Maria, MD   40 mg at 12/22/23 0931   phenytoin  (DILANTIN ) chewable tablet 50 mg  50 mg Oral QHS Zheng, Michael, DO   50 mg at 12/21/23 2120   phenytoin  (DILANTIN ) ER capsule 200 mg  200 mg Oral BID Zheng, Michael, DO   200 mg at 12/22/23 9068   revefenacin  (YUPELRI ) nebulizer solution 175 mcg  175 mcg Nebulization Daily Albustami, Omar M, MD   175 mcg at 12/22/23 9170   rosuvastatin  (CRESTOR ) tablet 40 mg  40 mg Oral QHS Zheng, Michael, DO   40 mg at 12/21/23 2120   senna-docusate (Senokot-S) tablet 1 tablet  1 tablet Oral QHS PRN Gomez-Caraballo, Maria, MD       torsemide  (DEMADEX ) tablet 40 mg  40 mg Oral Daily Gomez-Caraballo, Maria, MD   40 mg at 12/22/23 0931   warfarin (COUMADIN ) tablet 3.5 mg  3.5 mg Oral ONCE-1600 Reome, Earle J, RPH       Warfarin - Pharmacist Dosing Inpatient  Does not apply q1600 Oriet, Jonathan, [facility]         Discharge Medications: Please see discharge summary for a list of discharge medications.  Relevant Imaging Results:  Relevant Lab Results:   Additional Information SS# [id number]  Luise JAYSON Pan, LCSWA

## 2023-12-23 LAB — BLOOD GAS, VENOUS
Acid-Base Excess: 20.8 mmol/L — ABNORMAL HIGH (ref 0.0–2.0)
Bicarbonate: 49.9 mmol/L — ABNORMAL HIGH (ref 20.0–28.0)
O2 Saturation: 83 %
Patient temperature: 36.9
pCO2, Ven: 77 mmHg (ref 44–60)
pH, Ven: 7.42 (ref 7.25–7.43)
pO2, Ven: 52 mmHg — ABNORMAL HIGH (ref 32–45)

## 2023-12-23 LAB — PROTIME-INR
INR: 1.8 — ABNORMAL HIGH (ref 0.8–1.2)
Prothrombin Time: 21.4 s — ABNORMAL HIGH (ref 11.4–15.2)

## 2023-12-23 LAB — BASIC METABOLIC PANEL WITH GFR
Anion gap: 12 (ref 5–15)
BUN: 7 mg/dL — ABNORMAL LOW (ref 8–23)
CO2: 38 mmol/L — ABNORMAL HIGH (ref 22–32)
Calcium: 8.4 mg/dL — ABNORMAL LOW (ref 8.9–10.3)
Chloride: 90 mmol/L — ABNORMAL LOW (ref 98–111)
Creatinine, Ser: 0.48 mg/dL — ABNORMAL LOW (ref 0.61–1.24)
GFR, Estimated: >60 mL/min (ref >60)
Glucose, Bld: 92 mg/dL (ref 70–99)
Potassium: 3.3 mmol/L — ABNORMAL LOW (ref 3.5–5.1)
Sodium: 140 mmol/L (ref 135–145)

## 2023-12-23 LAB — MAGNESIUM: Magnesium: 1.9 mg/dL (ref 1.7–2.4)

## 2023-12-23 MED ORDER — MAGNESIUM OXIDE -MG SUPPLEMENT 400 (240 MG) MG PO TABS
400.0000 mg | ORAL_TABLET | Freq: Two times a day (BID) | ORAL | Status: DC
  Administered 2023-12-23: 400 mg via ORAL
  Filled 2023-12-23: qty 1

## 2023-12-23 MED ORDER — NYSTATIN 100000 UNIT/GM EX POWD: Freq: Three times a day (TID) | CUTANEOUS | Status: AC

## 2023-12-23 MED ORDER — GERHARDT'S BUTT CREAM: 1.0000 | TOPICAL_CREAM | Freq: Two times a day (BID) | CUTANEOUS | Status: AC | PRN

## 2023-12-23 MED ORDER — WARFARIN SODIUM 5 MG PO TABS
5.0000 mg | ORAL_TABLET | Freq: Once | ORAL | Status: AC
  Administered 2023-12-23: 5 mg via ORAL
  Filled 2023-12-23: qty 1

## 2023-12-23 MED ORDER — PHENYTOIN 50 MG PO CHEW: 50.0000 mg | CHEWABLE_TABLET | Freq: Every day | ORAL | Status: AC

## 2023-12-23 MED ORDER — POTASSIUM CHLORIDE CRYS ER 20 MEQ PO TBCR
40.0000 meq | EXTENDED_RELEASE_TABLET | ORAL | Status: AC
  Administered 2023-12-23 (×2): 40 meq via ORAL
  Filled 2023-12-23 (×2): qty 2

## 2023-12-23 NOTE — Discharge Summary (Signed)
 Name: James Villegas MRN: [id number] DOB: August 17, 1955 68 y.o. PCP: Feliciano Devoria LABOR, MD  Date of Admission: 12/19/2023  9:15 AM Date of Discharge: 12/23/2023 Attending Physician: Dr. Dayton [facility]  Discharge Diagnosis: 1. Principal Problem:   Acute on chronic hypoxic respiratory failure (HCC) Active Problems:   Seizure disorder (HCC)   Chronic diastolic CHF (congestive heart failure) (HCC)   Atrial fibrillation, chronic (HCC) on chronic anticoagulation   History of CVA withR sided hemiparesis and mild dysarthria   Lupus anticoagulant disorder (HCC)   Pressure injury of buttock, stage 2 (HCC) - present on admission   Morbid obesity due to BMO of 37 with related chronic HTN, GERD, CHF, and OHS   Discharge Medications: Allergies as of 12/23/2023       Reactions   Fenofibrate Other (See Comments)   Reaction not listed on MAR         Medication List     PAUSE taking these medications    spironolactone  25 MG tablet Wait to take this until your doctor or other care provider tells you to start again. Commonly known as: ALDACTONE  Take 1 tablet (25 mg total) by mouth daily.       STOP taking these medications    amoxicillin -clavulanate 875-125 MG tablet Commonly known as: AUGMENTIN    doxycycline  100 MG tablet Commonly known as: ADOXA   linezolid  600 MG tablet Commonly known as: ZYVOX    losartan 25 MG tablet Commonly known as: COZAAR       TAKE these medications    acetaminophen  325 MG tablet Commonly known as: TYLENOL  Take 650 mg by mouth every 6 (six) hours as needed for mild pain, headache or fever.   antiseptic oral rinse Liqd 1 Application by Mouth Rinse route at bedtime.   ARTIFICIAL TEAR OP Place 1 drop into both eyes in the morning, at noon, in the evening, and at bedtime.   Baclofen  5 MG Tabs Take 5 mg by mouth at bedtime.   Biotene Dry Mouth Gentle Pste Place 1 Application onto teeth at bedtime.   bisacodyl  5 MG EC tablet Commonly known  as: DULCOLAX Take 10 mg by mouth daily as needed for moderate constipation.   cycloSPORINE  0.05 % ophthalmic emulsion Commonly known as: RESTASIS  Place 1 drop into both eyes 2 (two) times daily.   DULoxetine  30 MG capsule Commonly known as: CYMBALTA  Take 30 mg by mouth in the morning.   ezetimibe  10 MG tablet Commonly known as: ZETIA  Take 10 mg by mouth daily.   fluticasone -salmeterol 250-50 MCG/ACT Aepb Commonly known as: ADVAIR Inhale 1 puff into the lungs in the morning and at bedtime.   Gerhardt's butt cream Crea Apply 1 Application topically 2 (two) times daily as needed for irritation.   guaiFENesin  100 MG/5ML liquid Commonly known as: ROBITUSSIN Take 15 mLs by mouth every 4 (four) hours as needed for cough.   hydrocortisone  cream 1 % Apply 1 Application topically daily. Apply to right neck for 7 days starting on 12/18/23.   ipratropium-albuterol  0.5-2.5 (3) MG/3ML Soln Commonly known as: DUONEB Take 3 mLs by nebulization every 4 (four) hours as needed.   Jardiance  10 MG Tabs tablet Generic drug: empagliflozin  Take 10 mg by mouth daily.   nystatin  powder Commonly known as: MYCOSTATIN /NYSTOP  Apply topically 3 (three) times daily.   omeprazole  20 MG capsule Commonly known as: PRILOSEC Take 20 mg by mouth every other day.   OXYGEN Inhale 4 L into the lungs continuous.   phenytoin  100 MG  ER capsule Commonly known as: DILANTIN  Take 200 mg by mouth 2 (two) times daily.   phenytoin  50 MG tablet Commonly known as: DILANTIN  Chew 1 tablet (50 mg total) by mouth at bedtime. What changed: how much to take   Refresh Optive Advanced 0.5-1-0.5 % Soln Generic drug: Carboxymeth-Glycerin -Polysorb Place 1 drop into both eyes in the morning, at noon, in the evening, and at bedtime.   rosuvastatin  40 MG tablet Commonly known as: CRESTOR  Take 40 mg by mouth at bedtime.   senna-docusate 8.6-50 MG tablet Commonly known as: Senokot-S Take 1 tablet by mouth at bedtime  as needed for mild constipation. What changed: when to take this   torsemide  20 MG tablet Commonly known as: DEMADEX  Take 40 mg by mouth in the morning and at bedtime.   traZODone  50 MG tablet Commonly known as: DESYREL  Take 50 mg by mouth at bedtime.   Valtrex 1000 MG tablet Generic drug: valACYclovir Take 1,000 mg by mouth 2 (two) times daily.   warfarin 1 MG tablet Commonly known as: COUMADIN  Take as directed. If you are unsure how to take this medication, talk to your nurse or doctor. Original instructions: Take 1 mg by mouth daily. Take along with 2.5mg  tablet for a daily dose of 3.5mg    warfarin 2.5 MG tablet Commonly known as: COUMADIN  Take as directed. If you are unsure how to take this medication, talk to your nurse or doctor. Original instructions: Take 2.5 mg by mouth daily.        Disposition and follow-up:   James Villegas was discharged from [facility] in Good condition.  At the hospital follow up visit please address:  Chronic Hypoxic Respiratory failure--ensure adherence to BiPAP when sleeping and 6LNC when awake.  2. Hypertension--Losartan and Spironolactone  held at discharge due to soft BP's while admitted.  3. Electrolyte abnormalities--Required K+ and Mg while inpatient.  4. Epilepsy--Phenytoin  level subtherapeutic on admission--Pt should receive 200mg  in the AM and 150 at bedtime 5. Nutrition--Pt's albumin chronically low with electrolyte abnormalities. Ensure pt is adhering to diet and is getting nutritional supplements   2.  Labs / imaging needed at time of follow-up: [facility]   Hospital Course by problem list: James Villegas is a 68 y.o. person living with a history of L CVA with Right sided deficits, afib, HFpEF, chronic respiratory failure, and lupus anticoagulant who presented with AMS and hypoxia and admitted for acute on chronic hypoxic hypercapnic respiratory failure now being discharged on hospital day 3 with the following  pertinent hospital course:  Acute on Chronic Hypoxic and Hypercapnic Respiratory  Encephalopathy The patient presented to the ED on 7/12 after being found in his facility with altered mental status and hypertension. While in transit with EMS, his O2 dropped to 78%. He was placed on BiPAP with resolution of his symptoms within a few hours. She stated that he did not like to wear his BiPAP because the mask is tight and it dries his mouth out. This was his 4th admission to [facility] since 10/2023. A procalcitonin was obtained for concern for PNA due to CXR findings and was not elevated. On 7/13 he had resolution of his symptoms. He did require increased oxygen during admission at [facility], likely his new baseline. The facility was contacted and the medical team was team was told that the patient did not have a BiPAP and he wore 4LNC at all times in the facility. Social work was contacted to ensure a BiPAP would  be at the facility so the patient could be safely discharged. On 7/16, confirmation of the BiPAP was received and the patient was deemed safe for discharge with education on the importance of adherence. VBG day of discharge showed CO2 77 which is the patient baseline.    Stable chronic medical conditions: Epilepsy HFpEF R CVA with L sided deficits  Subjective Pt was seen and examined at the bedside this morning. He was drowsy with slight delay in initiating conversation, however answered questions appropriately and was interactive with the team. He discussed that he worse the BiPAP for longer last night but that he is still having issues with the dryness of his mouth and the tightness of the mask.   Discharge Exam:   BP (!) 105/59 (BP Location: Left Arm)   Pulse 80   Temp 98.4 F (36.9 C) (Oral)   Resp 20   Wt 127 kg   SpO2 96%   BMI 36.94 kg/m  Discharge exam:  Const:: Awake, alert in NAD HENT: Normocephalic, atraumatic,  Card: RRR, No MRG, pitting edema bilaterally R>L Resp: Decreased  bilaterally at the bases. No crackles Abd: Soft, NTND, Bsx4 Extremities: Warm, pink. RLE with erythematous discoloration, likely venous stasis Neuro: MS 0/5 in RUE and RLE   Pertinent Labs, Studies, and Procedures:     Latest Ref Rng & Units 12/21/2023    3:14 AM 12/20/2023   12:02 PM 12/19/2023    3:11 PM  CBC  WBC 4.0 - 10.5 K/uL 8.4  10.0    Hemoglobin 13.0 - 17.0 g/dL 86.5  87.2  86.0   Hematocrit 39.0 - 52.0 % 41.9  39.7  41.0   Platelets 150 - 400 K/uL 298  310         Latest Ref Rng & Units 12/23/2023    2:46 AM 12/22/2023    2:27 AM 12/21/2023    3:14 AM  CMP  Glucose 70 - 99 mg/dL 92  85  84   BUN 8 - 23 mg/dL 7  6  9    Creatinine 0.61 - 1.24 mg/dL 9.51  9.50  9.35   Sodium 135 - 145 mmol/L 140  140  138   Potassium 3.5 - 5.1 mmol/L 3.3  4.0  3.4   Chloride 98 - 111 mmol/L 90  93  92   CO2 22 - 32 mmol/L 38  36  36   Calcium  8.9 - 10.3 mg/dL 8.4  7.9  8.1   Total Protein 6.5 - 8.1 g/dL   5.8   Total Bilirubin 0.0 - 1.2 mg/dL   1.0   Alkaline Phos 38 - 126 U/L   120   AST 15 - 41 U/L   19   ALT 0 - 44 U/L   15     DG Chest Port 1 View Result Date: 12/19/2023 CLINICAL DATA:  Hypoxia and altered mental status EXAM: PORTABLE CHEST 1 VIEW COMPARISON:  12/10/2023 FINDINGS: Continued blunting of the right lateral costophrenic angle. Scarring or atelectasis at the left lung base. Mild cardiomegaly.  Bilateral airway thickening. Asymmetric hazy interstitial and patchy airspace opacity in the left lung compared to the right, possibly from asymmetric edema or pneumonia. Degenerative glenohumeral arthropathy bilaterally. Thoracic spondylosis. IMPRESSION: 1. Asymmetric hazy interstitial and patchy airspace opacity in the left lung compared to the right, possibly from asymmetric edema or pneumonia. 2. Mild cardiomegaly. 3. Continued blunting of the right lateral costophrenic angle, possibly from a small effusion or pleural thickening. 4. Scarring or  atelectasis at the left lung base. 5.  Degenerative glenohumeral arthropathy bilaterally. Electronically Signed   By: Ryan Salvage M.D.   On: 12/19/2023 11:03     Discharge Instructions:   Discharge Instructions      Thank you for allowing us  to be part of your care. You were hospitalized for Acute hypoxic hypercapnic respiratory failure. We treated you with Lasix , BiPAP, and Oxygen  See the changes in your medications and management of your chronic conditions below:  *For your Respiratory Failure -We have STARTED you on these following medications:  -BiPAP--Wear when napping and at night  -Torsemide --continue taking 40mg  every 12 hours             -Oxygen--Continue 6L [place]   *For your Heart failure -We have STOPPED the following medications:  -Losartan  -Spironolactone   *For your Lupus Anticoagulant -We have Continued you on these following medications:  -Warfarin---Take 1mg  tablet and a 2.5mg  tablet for a TOTAL of 3.5mg  per day                    -Check INR and ensure it is BETWEEN 2-3                     -please follow up with the anticoagulation clinic   Please make sure to where your BiPAP when sleeping  Please call your PCP or our clinic if you have any questions or concerns, we may be able to help and keep you from a long and expensive emergency room wait. Our clinic and after hours phone number is [phone]. The best time to call is Monday through Friday 9 am to 4 pm but there is always someone available 24/7 if you have an emergency. If you need medication refills please notify your pharmacy one week in advance and they will send us  a request.   We are glad you are feeling better,  Schuyler Novak, DO Internal Medicine Inpatient Teaching Service at [facility]     Signed: Novak Schuyler, DO 12/23/2023, 2:05 PM

## 2023-12-23 NOTE — TOC Progression Note (Signed)
 Transition of Care [facility]) - Progression Note    Patient Details  Name: James Villegas MRN: [id number] Date of Birth: July 17, 1955  Transition of Care [facility]) CM/SW Contact  Luise JAYSON Pan, [place] Phone Number: 12/23/2023, 10:30 AM  Clinical Narrative:   Per Leonidas, patients bipap/cpap has been delivered at facility.   TOC will continue to follow.    Expected Discharge Plan: Skilled Nursing Facility Barriers to Discharge: Continued Medical Work up  Expected Discharge Plan and Services In-house Referral: Clinical Social Work   Post Acute Care Choice: Skilled Nursing Facility Living arrangements for the past 2 months: Skilled Nursing Facility                                       Social Determinants of Health (SDOH) Interventions SDOH Screenings   Food Insecurity: Patient Unable To Answer (12/20/2023)  Housing: Patient Unable To Answer (12/20/2023)  Transportation Needs: Patient Unable To Answer (12/20/2023)  Utilities: Patient Unable To Answer (12/20/2023)  Social Connections: Unknown (12/20/2023)  Tobacco Use: Medium Risk (12/10/2023)    Readmission Risk Interventions    08/26/2022   11:56 AM  Readmission Risk Prevention Plan  Transportation Screening Complete  PCP or Specialist Appt within 5-7 Days Complete  Home Care Screening Complete  Medication Review (RN CM) Referral to Pharmacy

## 2023-12-23 NOTE — Progress Notes (Signed)
 PHARMACY - ANTICOAGULATION CONSULT NOTE  Pharmacy Consult for warfarin Indication: atrial fibrillation  Allergies  Allergen Reactions   Fenofibrate Other (See Comments)    Reaction not listed on [facility]     Patient Measurements: Weight: 127 kg (279 lb 15.8 oz)  Vital Signs: Temp: 98.4 F (36.9 C) (07/16 0518) Temp Source: Oral (07/16 0518) BP: 115/65 (07/16 0518) Pulse Rate: 90 (07/16 0518)  Labs: Recent Labs    12/20/23 1202 12/21/23 0314 12/22/23 0227 12/23/23 0246  HGB 12.7* 13.4  --   --   HCT 39.7 41.9  --   --   PLT 310 298  --   --   LABPROT  --  26.5* 23.2* 21.4*  INR  --  2.3* 1.9* 1.8*  CREATININE  --  0.64 0.49* 0.48*    Estimated Creatinine Clearance: 128.5 mL/min (A) (by C-G formula based on SCr of 0.48 mg/dL (L)).   Assessment: 9 YOM with medical history significant for atrial fibrillation and lupus anticoagulan on warfarin PTA who is admitted for AMS. Last dose administered 7/11, INR on admission is therapeutic at 2.4. Pharmacy consulted to manage warfarin while inpatient.   PTA dosing: 3.5mg  daily (has warfarin 1mg  and 2.5mg  tablets at home).   Warfarin dose 7/12 not given. INR within goal but trending up, will decrease PTA dose slightly to prevent supra therapeutic level.  7/16 AM update: INR 1.8 Hgb / PLT wnl  Goal of Therapy:  INR 2-3 Monitor platelets by anticoagulation protocol: Yes   Plan:  Give warfarin 5 mg PO x1 dose Check INR daily while on warfarin Continue to monitor H&H and platelets   Thank you for allowing pharmacy to be a part of this patient's care.  Benedetta Heath BS, PharmD, BCPS Clinical Pharmacist 12/23/2023 7:15 AM  Contact: [phone] after 3 PM  Be curious, not judgmental... -Davina Sprinkles

## 2023-12-23 NOTE — Discharge Instructions (Signed)
 Thank you for allowing us  to be part of your care. You were hospitalized for Acute hypoxic hypercapnic respiratory failure. We treated you with Lasix , BiPAP, and Oxygen  See the changes in your medications and management of your chronic conditions below:  *For your Respiratory Failure -We have STARTED you on these following medications:  -BiPAP--Wear when napping and at night  -Torsemide --continue taking 40mg  every 12 hours             -Oxygen--Continue 6L [place]   *For your Heart failure -We have STOPPED the following medications:  -Losartan  -Spironolactone   *For your Lupus Anticoagulant -We have Continued you on these following medications:  -Warfarin---Take 1mg  tablet and a 2.5mg  tablet for a TOTAL of 3.5mg  per day                    -Check INR and ensure it is BETWEEN 2-3                     -please follow up with the anticoagulation clinic   Please make sure to where your BiPAP when sleeping  Please call your PCP or our clinic if you have any questions or concerns, we may be able to help and keep you from a long and expensive emergency room wait. Our clinic and after hours phone number is [phone]. The best time to call is Monday through Friday 9 am to 4 pm but there is always someone available 24/7 if you have an emergency. If you need medication refills please notify your pharmacy one week in advance and they will send us  a request.   We are glad you are feeling better,  Schuyler Novak, DO Internal Medicine Inpatient Teaching Service at [facility]

## 2023-12-23 NOTE — Progress Notes (Signed)
 Initial Nutrition Assessment  DOCUMENTATION CODES:   Obesity unspecified  INTERVENTION:  -Continue heart healthy menu, regular texture, thin liquids -Add Ensure Plus High Protein for adequate kcal/pro -Add Juven BID to promote wound healing -Add MVI w/min    NUTRITION DIAGNOSIS:   Increased nutrient needs related to wound healing as evidenced by estimated needs.   GOAL:   Patient will meet greater than or equal to 90% of their needs   MONITOR:   Supplement acceptance, Labs, PO intake, Weight trends, Skin  REASON FOR ASSESSMENT:   Other (Comment) (Multiple pressure ulcerations)    ASSESSMENT:   Hx L CVA with right hemiplegia, afib and lupus anticoagulant on coumadin , HFpEF (65 to 70%), OSA/OHS and chronic hypercarbia with intermittent adherence to BiPAP at [facility]. Admit with AMS, acute on chronic hypoxic resp failure.  Spoke to pt at bedside. Pt denies n/v/c/d or chewing/swallowing difficulties at this time. Last BM 7/14. Pt with good appetite, eating 90-100% of meals; however, provided meals not meeting pt's estimated needs. Add Ensure Plus High Protein TID to promote adequate kcal/pro intake. Pt with two Stage II ulcerations to buttocks. Add Juven BID to promote wound healing. Pt does have mod/severe edema, particularly on his right side r/t hemiplegia. NFPE completed (see below), right sided muscle deficits noted suspect r/t hemiplegia. Difficult to assess muscle wasting r/t edema, body habitus, inability to move right side of body. Encouraged pt to continue to focus on protein intake. Pt states that he does have visit's from [facility] RD prn. Pt denies additional questions/concerns at this time, will continue to monitor, RDN available prn.   Labs Potassium 3.3 BUN 7 Cr 0.48 Calcium  8.4 Albumin 2.6  Medications Duloxetine  Jardiance  Mg oxide Protonix  Potassium Cl Torsemide   NUTRITION - FOCUSED PHYSICAL EXAM:  Flowsheet Row Most Recent Value  Orbital  Region No depletion  Upper Arm Region No depletion  Thoracic and Lumbar Region No depletion  Buccal Region No depletion  Temple Region No depletion  Clavicle Bone Region No depletion  Clavicle and Acromion Bone Region No depletion  Scapular Bone Region No depletion  Dorsal Hand Mild depletion  Patellar Region Moderate depletion  [Difficult to assess r/t edema, unable to move right side of body]  Anterior Thigh Region Moderate depletion  [Difficult to assess r/t edema, unable to move right side of body]  Posterior Calf Region Moderate depletion  [Difficult to assess r/t edema, unable to move right side of body]  Edema (RD Assessment) Moderate  Hair Reviewed  Eyes Reviewed  Mouth Reviewed  Skin Reviewed  Nails Reviewed    Diet Order:   Diet Order             Diet - low sodium heart healthy           Diet Heart Room service appropriate? Yes; Fluid consistency: Thin; Fluid restriction: 1500 mL Fluid  Diet effective now                   EDUCATION NEEDS:   Education needs have been addressed  Skin:  Skin Assessment: Skin Integrity Issues: Skin Integrity Issues:: Stage II, Other (Comment) Stage II: Left buttocks, right buttocks Other: contact dermatitis  Last BM:  7/14  Height:   Ht Readings from Last 1 Encounters:  12/10/23 6' 1 (1.854 m)    Weight:   Wt Readings from Last 1 Encounters:  12/23/23 127 kg    BMI:  Body mass index is 36.94 kg/m.  Estimated Nutritional Needs:  Kcal:  2525-2950 kcal  Protein:  125-165 g  Fluid:  1500 ml fluid rest    James Villegas, RDN, LDN Registered Dietitian Nutritionist RD Inpatient Contact Info in [place]

## 2023-12-23 NOTE — Care Management Important Message (Signed)
 Important Message  Patient Details  Name: James Villegas MRN: [id number] Date of Birth: Aug 29, 1955   Important Message Given:  Yes - Medicare IM     Jon Cruel 12/23/2023, 1:52 PM

## 2023-12-23 NOTE — H&P (Deleted)
 Name: James Villegas MRN: [id number] DOB: August 17, 1955 68 y.o. PCP: Feliciano Devoria LABOR, MD  Date of Admission: 12/19/2023  9:15 AM Date of Discharge: 12/23/2023 Attending Physician: Dr. Dayton Eastern  Discharge Diagnosis: 1. Principal Problem:   Acute on chronic hypoxic respiratory failure (HCC) Active Problems:   Seizure disorder (HCC)   Chronic diastolic CHF (congestive heart failure) (HCC)   Atrial fibrillation, chronic (HCC) on chronic anticoagulation   History of CVA withR sided hemiparesis and mild dysarthria   Lupus anticoagulant disorder (HCC)   Pressure injury of buttock, stage 2 (HCC) - present on admission   Morbid obesity due to BMO of 37 with related chronic HTN, GERD, CHF, and OHS   Discharge Medications: Allergies as of 12/23/2023       Reactions   Fenofibrate Other (See Comments)   Reaction not listed on MAR         Medication List     PAUSE taking these medications    spironolactone  25 MG tablet Wait to take this until your doctor or other care provider tells you to start again. Commonly known as: ALDACTONE  Take 1 tablet (25 mg total) by mouth daily.       STOP taking these medications    amoxicillin -clavulanate 875-125 MG tablet Commonly known as: AUGMENTIN    doxycycline  100 MG tablet Commonly known as: ADOXA   linezolid  600 MG tablet Commonly known as: ZYVOX    losartan 25 MG tablet Commonly known as: COZAAR       TAKE these medications    acetaminophen  325 MG tablet Commonly known as: TYLENOL  Take 650 mg by mouth every 6 (six) hours as needed for mild pain, headache or fever.   antiseptic oral rinse Liqd 1 Application by Mouth Rinse route at bedtime.   ARTIFICIAL TEAR OP Place 1 drop into both eyes in the morning, at noon, in the evening, and at bedtime.   Baclofen  5 MG Tabs Take 5 mg by mouth at bedtime.   Biotene Dry Mouth Gentle Pste Place 1 Application onto teeth at bedtime.   bisacodyl  5 MG EC tablet Commonly known  as: DULCOLAX Take 10 mg by mouth daily as needed for moderate constipation.   cycloSPORINE  0.05 % ophthalmic emulsion Commonly known as: RESTASIS  Place 1 drop into both eyes 2 (two) times daily.   DULoxetine  30 MG capsule Commonly known as: CYMBALTA  Take 30 mg by mouth in the morning.   ezetimibe  10 MG tablet Commonly known as: ZETIA  Take 10 mg by mouth daily.   fluticasone -salmeterol 250-50 MCG/ACT Aepb Commonly known as: ADVAIR Inhale 1 puff into the lungs in the morning and at bedtime.   Gerhardt's butt cream Crea Apply 1 Application topically 2 (two) times daily as needed for irritation.   guaiFENesin  100 MG/5ML liquid Commonly known as: ROBITUSSIN Take 15 mLs by mouth every 4 (four) hours as needed for cough.   hydrocortisone  cream 1 % Apply 1 Application topically daily. Apply to right neck for 7 days starting on 12/18/23.   ipratropium-albuterol  0.5-2.5 (3) MG/3ML Soln Commonly known as: DUONEB Take 3 mLs by nebulization every 4 (four) hours as needed.   Jardiance  10 MG Tabs tablet Generic drug: empagliflozin  Take 10 mg by mouth daily.   nystatin  powder Commonly known as: MYCOSTATIN /NYSTOP  Apply topically 3 (three) times daily.   omeprazole  20 MG capsule Commonly known as: PRILOSEC Take 20 mg by mouth every other day.   OXYGEN Inhale 4 L into the lungs continuous.   phenytoin  100 MG  ER capsule Commonly known as: DILANTIN  Take 200 mg by mouth 2 (two) times daily.   phenytoin  50 MG tablet Commonly known as: DILANTIN  Chew 1 tablet (50 mg total) by mouth at bedtime. What changed: how much to take   Refresh Optive Advanced 0.5-1-0.5 % Soln Generic drug: Carboxymeth-Glycerin -Polysorb Place 1 drop into both eyes in the morning, at noon, in the evening, and at bedtime.   rosuvastatin  40 MG tablet Commonly known as: CRESTOR  Take 40 mg by mouth at bedtime.   senna-docusate 8.6-50 MG tablet Commonly known as: Senokot-S Take 1 tablet by mouth at bedtime  as needed for mild constipation. What changed: when to take this   torsemide  20 MG tablet Commonly known as: DEMADEX  Take 40 mg by mouth in the morning and at bedtime.   traZODone  50 MG tablet Commonly known as: DESYREL  Take 50 mg by mouth at bedtime.   Valtrex 1000 MG tablet Generic drug: valACYclovir Take 1,000 mg by mouth 2 (two) times daily.   warfarin 1 MG tablet Commonly known as: COUMADIN  Take as directed. If you are unsure how to take this medication, talk to your nurse or doctor. Original instructions: Take 1 mg by mouth daily. Take along with 2.5mg  tablet for a daily dose of 3.5mg    warfarin 2.5 MG tablet Commonly known as: COUMADIN  Take as directed. If you are unsure how to take this medication, talk to your nurse or doctor. Original instructions: Take 2.5 mg by mouth daily.        Disposition and follow-up:   James Villegas was discharged from [facility] in Good condition.  At the hospital follow up visit please address:  Chronic Hypoxic Respiratory failure--ensure adherence to BiPAP when sleeping and 6LNC when awake.  2. Hypertension--Losartan and Spironolactone  held at discharge due to soft BP's while admitted.  3. Electrolyte abnormalities--Required K+ and Mg while inpatient.  4. Epilepsy--Phenytoin  level subtherapeutic on admission--Pt should receive 200mg  in the AM and 150 at bedtime 5. Nutrition--Pt's albumin chronically low with electrolyte abnormalities. Ensure pt is adhering to diet and is getting nutritional supplements   2.  Labs / imaging needed at time of follow-up: [facility]   Hospital Course by problem list: James Villegas is a 68 y.o. person living with a history of L CVA with Right sided deficits, afib, HFpEF, chronic respiratory failure, and lupus anticoagulant who presented with AMS and hypoxia and admitted for acute on chronic hypoxic hypercapnic respiratory failure now being discharged on hospital day 3 with the following  pertinent hospital course:  Acute on Chronic Hypoxic and Hypercapnic Respiratory  Encephalopathy The patient presented to the ED on 7/12 after being found in his facility with altered mental status and hypertension. While in transit with EMS, his O2 dropped to 78%. He was placed on BiPAP with resolution of his symptoms within a few hours. She stated that he did not like to wear his BiPAP because the mask is tight and it dries his mouth out. This was his 4th admission to [facility] since 10/2023. A procalcitonin was obtained for concern for PNA due to CXR findings and was not elevated. On 7/13 he had resolution of his symptoms. He did require increased oxygen during admission at [facility], likely his new baseline. The facility was contacted and the medical team was team was told that the patient did not have a BiPAP and he wore 4LNC at all times in the facility. Social work was contacted to ensure a BiPAP would  be at the facility so the patient could be safely discharged. On 7/16, confirmation of the BiPAP was received and the patient was deemed safe for discharge with education on the importance of adherence. VBG day of discharge showed CO2 77 which is the patient baseline.    Stable chronic medical conditions: Epilepsy HFpEF R CVA with L sided deficits  Subjective Pt was seen and examined at the bedside this morning. He was drowsy with slight delay in initiating conversation, however answered questions appropriately and was interactive with the team. He discussed that he worse the BiPAP for longer last night but that he is still having issues with the dryness of his mouth and the tightness of the mask.   Discharge Exam:   BP (!) 105/59 (BP Location: Left Arm)   Pulse 80   Temp 98.4 F (36.9 C) (Oral)   Resp 20   Wt 127 kg   SpO2 96%   BMI 36.94 kg/m  Discharge exam:  Const:: Awake, alert in NAD HENT: Normocephalic, atraumatic,  Card: RRR, No MRG, pitting edema bilaterally R>L Resp: Decreased  bilaterally at the bases. No crackles Abd: Soft, NTND, Bsx4 Extremities: Warm, pink. RLE with erythematous discoloration, likely venous stasis Neuro: MS 0/5 in RUE and RLE   Pertinent Labs, Studies, and Procedures:     Latest Ref Rng & Units 12/21/2023    3:14 AM 12/20/2023   12:02 PM 12/19/2023    3:11 PM  CBC  WBC 4.0 - 10.5 K/uL 8.4  10.0    Hemoglobin 13.0 - 17.0 g/dL 86.5  87.2  86.0   Hematocrit 39.0 - 52.0 % 41.9  39.7  41.0   Platelets 150 - 400 K/uL 298  310         Latest Ref Rng & Units 12/23/2023    2:46 AM 12/22/2023    2:27 AM 12/21/2023    3:14 AM  CMP  Glucose 70 - 99 mg/dL 92  85  84   BUN 8 - 23 mg/dL 7  6  9    Creatinine 0.61 - 1.24 mg/dL 9.51  9.50  9.35   Sodium 135 - 145 mmol/L 140  140  138   Potassium 3.5 - 5.1 mmol/L 3.3  4.0  3.4   Chloride 98 - 111 mmol/L 90  93  92   CO2 22 - 32 mmol/L 38  36  36   Calcium  8.9 - 10.3 mg/dL 8.4  7.9  8.1   Total Protein 6.5 - 8.1 g/dL   5.8   Total Bilirubin 0.0 - 1.2 mg/dL   1.0   Alkaline Phos 38 - 126 U/L   120   AST 15 - 41 U/L   19   ALT 0 - 44 U/L   15     DG Chest Port 1 View Result Date: 12/19/2023 CLINICAL DATA:  Hypoxia and altered mental status EXAM: PORTABLE CHEST 1 VIEW COMPARISON:  12/10/2023 FINDINGS: Continued blunting of the right lateral costophrenic angle. Scarring or atelectasis at the left lung base. Mild cardiomegaly.  Bilateral airway thickening. Asymmetric hazy interstitial and patchy airspace opacity in the left lung compared to the right, possibly from asymmetric edema or pneumonia. Degenerative glenohumeral arthropathy bilaterally. Thoracic spondylosis. IMPRESSION: 1. Asymmetric hazy interstitial and patchy airspace opacity in the left lung compared to the right, possibly from asymmetric edema or pneumonia. 2. Mild cardiomegaly. 3. Continued blunting of the right lateral costophrenic angle, possibly from a small effusion or pleural thickening. 4. Scarring or  atelectasis at the left lung base. 5.  Degenerative glenohumeral arthropathy bilaterally. Electronically Signed   By: Ryan Salvage M.D.   On: 12/19/2023 11:03     Discharge Instructions:   Discharge Instructions      Thank you for allowing us  to be part of your care. You were hospitalized for Acute hypoxic hypercapnic respiratory failure. We treated you with Lasix , BiPAP, and Oxygen  See the changes in your medications and management of your chronic conditions below:  *For your Respiratory Failure -We have STARTED you on these following medications:  -BiPAP--Wear when napping and at night  -Torsemide --continue taking 40mg  every 12 hours             -Oxygen--Continue 6L [place]   *For your Heart failure -We have STOPPED the following medications:  -Losartan  -Spironolactone   *For your Lupus Anticoagulant -We have Continued you on these following medications:  -Warfarin---Take 1mg  tablet and a 2.5mg  tablet for a TOTAL of 3.5mg  per day                    -Check INR and ensure it is BETWEEN 2-3                     -please follow up with the anticoagulation clinic   Please make sure to where your BiPAP when sleeping  Please call your PCP or our clinic if you have any questions or concerns, we may be able to help and keep you from a long and expensive emergency room wait. Our clinic and after hours phone number is [phone]. The best time to call is Monday through Friday 9 am to 4 pm but there is always someone available 24/7 if you have an emergency. If you need medication refills please notify your pharmacy one week in advance and they will send us  a request.   We are glad you are feeling better,  Schuyler Novak, DO Internal Medicine Inpatient Teaching Service at [facility]     Signed: Novak Schuyler, DO 12/23/2023, 2:05 PM

## 2023-12-23 NOTE — TOC Transition Note (Signed)
 Transition of Care [facility]) - Discharge Note   Patient Details  Name: James Villegas MRN: [id number] Date of Birth: 1955/12/02  Transition of Care [facility]) CM/SW Contact:  Luise JAYSON Pan, LCSWA Phone Number: 12/23/2023, 2:37 PM   Clinical Narrative:    Patient will DC to: [place] Anticipated DC date: 12/23/23  Family notified: Attempted to notify family, left VM  Transport by: [facility]   Per MD patient ready for DC to [facility]. RN to call report prior to discharge [phone]; room 117). RN, patient, patient's family, and facility notified of DC. Discharge Summary and FL2 sent to facility. DC packet on chart. Ambulance transport requested for patient 2:39 PM.   CSW will sign off for now as social work intervention is no longer needed. Please consult us  again if new needs arise.    Final next level of care: Skilled Nursing Facility Barriers to Discharge: Barriers Resolved   Patient Goals and CMS Choice Patient states their goals for this hospitalization and ongoing recovery are:: to return to [facility]          Discharge Placement              Patient chooses bed at: [facility] Patient to be transferred to facility by: [facility] Name of family member notified: Left VM for Devere (sis) Patient and family notified of of transfer: 12/23/23  Discharge Plan and Services Additional resources added to the After Visit Summary for   In-house Referral: Clinical Social Work   Post Acute Care Choice: Skilled Nursing Facility                               Social Drivers of Health (SDOH) Interventions SDOH Screenings   Food Insecurity: Patient Unable To Answer (12/20/2023)  Housing: Patient Unable To Answer (12/20/2023)  Transportation Needs: Patient Unable To Answer (12/20/2023)  Utilities: Patient Unable To Answer (12/20/2023)  Social Connections: Unknown (12/20/2023)  Tobacco Use: Medium Risk (12/10/2023)     Readmission Risk Interventions    08/26/2022    11:56 AM  Readmission Risk Prevention Plan  Transportation Screening Complete  PCP or Specialist Appt within 5-7 Days Complete  Home Care Screening Complete  Medication Review (RN CM) Referral to Pharmacy

## 2024-04-27 ENCOUNTER — Inpatient Hospital Stay (HOSPITAL_COMMUNITY)
Admission: EM | Admit: 2024-04-27 | Discharge: 2024-04-29 | DRG: 189 | Disposition: A | Discharge status: Skilled Nursing Facility | Source: Skilled Nursing Facility | Attending: Internal Medicine | Admitting: Internal Medicine

## 2024-04-27 ENCOUNTER — Encounter (HOSPITAL_COMMUNITY): Payer: Self-pay

## 2024-04-27 ENCOUNTER — Other Ambulatory Visit: Payer: Self-pay

## 2024-04-27 ENCOUNTER — Emergency Department (HOSPITAL_COMMUNITY)

## 2024-04-27 ENCOUNTER — Other Ambulatory Visit (HOSPITAL_COMMUNITY): Payer: Self-pay

## 2024-04-27 DIAGNOSIS — B9683 Acinetobacter baumannii as the cause of diseases classified elsewhere: Secondary | ICD-10-CM | POA: Diagnosis not present

## 2024-04-27 DIAGNOSIS — Z7984 Long term (current) use of oral hypoglycemic drugs: Secondary | ICD-10-CM

## 2024-04-27 DIAGNOSIS — I110 Hypertensive heart disease with heart failure: Secondary | ICD-10-CM | POA: Diagnosis present

## 2024-04-27 DIAGNOSIS — I5032 Chronic diastolic (congestive) heart failure: Secondary | ICD-10-CM | POA: Diagnosis not present

## 2024-04-27 DIAGNOSIS — J189 Pneumonia, unspecified organism: Secondary | ICD-10-CM | POA: Diagnosis present

## 2024-04-27 DIAGNOSIS — G40909 Epilepsy, unspecified, not intractable, without status epilepticus: Secondary | ICD-10-CM | POA: Diagnosis present

## 2024-04-27 DIAGNOSIS — F32A Depression, unspecified: Secondary | ICD-10-CM | POA: Diagnosis present

## 2024-04-27 DIAGNOSIS — E785 Hyperlipidemia, unspecified: Secondary | ICD-10-CM | POA: Diagnosis present

## 2024-04-27 DIAGNOSIS — D6862 Lupus anticoagulant syndrome: Secondary | ICD-10-CM | POA: Diagnosis present

## 2024-04-27 DIAGNOSIS — Z9981 Dependence on supplemental oxygen: Secondary | ICD-10-CM | POA: Diagnosis not present

## 2024-04-27 DIAGNOSIS — I69351 Hemiplegia and hemiparesis following cerebral infarction affecting right dominant side: Secondary | ICD-10-CM | POA: Diagnosis not present

## 2024-04-27 DIAGNOSIS — R7881 Bacteremia: Secondary | ICD-10-CM | POA: Diagnosis present

## 2024-04-27 DIAGNOSIS — Z79899 Other long term (current) drug therapy: Secondary | ICD-10-CM | POA: Diagnosis not present

## 2024-04-27 DIAGNOSIS — Z792 Long term (current) use of antibiotics: Secondary | ICD-10-CM | POA: Diagnosis not present

## 2024-04-27 DIAGNOSIS — Z1152 Encounter for screening for COVID-19: Secondary | ICD-10-CM | POA: Diagnosis not present

## 2024-04-27 DIAGNOSIS — G9340 Encephalopathy, unspecified: Secondary | ICD-10-CM | POA: Diagnosis present

## 2024-04-27 DIAGNOSIS — E662 Morbid (severe) obesity with alveolar hypoventilation: Secondary | ICD-10-CM | POA: Diagnosis present

## 2024-04-27 DIAGNOSIS — Z7951 Long term (current) use of inhaled steroids: Secondary | ICD-10-CM | POA: Diagnosis not present

## 2024-04-27 DIAGNOSIS — J9602 Acute respiratory failure with hypercapnia: Secondary | ICD-10-CM | POA: Diagnosis not present

## 2024-04-27 DIAGNOSIS — I4892 Unspecified atrial flutter: Secondary | ICD-10-CM | POA: Diagnosis present

## 2024-04-27 DIAGNOSIS — D6861 Antiphospholipid syndrome: Secondary | ICD-10-CM | POA: Diagnosis present

## 2024-04-27 DIAGNOSIS — E46 Unspecified protein-calorie malnutrition: Secondary | ICD-10-CM | POA: Diagnosis present

## 2024-04-27 DIAGNOSIS — J9622 Acute and chronic respiratory failure with hypercapnia: Secondary | ICD-10-CM | POA: Diagnosis present

## 2024-04-27 DIAGNOSIS — J440 Chronic obstructive pulmonary disease with acute lower respiratory infection: Secondary | ICD-10-CM | POA: Diagnosis present

## 2024-04-27 DIAGNOSIS — Z7901 Long term (current) use of anticoagulants: Secondary | ICD-10-CM

## 2024-04-27 DIAGNOSIS — J9621 Acute and chronic respiratory failure with hypoxia: Principal | ICD-10-CM | POA: Diagnosis present

## 2024-04-27 DIAGNOSIS — I4891 Unspecified atrial fibrillation: Secondary | ICD-10-CM | POA: Diagnosis present

## 2024-04-27 DIAGNOSIS — Z6837 Body mass index (BMI) 37.0-37.9, adult: Secondary | ICD-10-CM

## 2024-04-27 DIAGNOSIS — B957 Other staphylococcus as the cause of diseases classified elsewhere: Secondary | ICD-10-CM | POA: Diagnosis present

## 2024-04-27 DIAGNOSIS — I5033 Acute on chronic diastolic (congestive) heart failure: Secondary | ICD-10-CM | POA: Diagnosis present

## 2024-04-27 DIAGNOSIS — B952 Enterococcus as the cause of diseases classified elsewhere: Secondary | ICD-10-CM | POA: Diagnosis not present

## 2024-04-27 DIAGNOSIS — Z888 Allergy status to other drugs, medicaments and biological substances status: Secondary | ICD-10-CM

## 2024-04-27 DIAGNOSIS — E66812 Obesity, class 2: Secondary | ICD-10-CM | POA: Diagnosis present

## 2024-04-27 DIAGNOSIS — J9601 Acute respiratory failure with hypoxia: Secondary | ICD-10-CM | POA: Diagnosis not present

## 2024-04-27 DIAGNOSIS — Z91199 Patient's noncompliance with other medical treatment and regimen due to unspecified reason: Secondary | ICD-10-CM

## 2024-04-27 DIAGNOSIS — G4700 Insomnia, unspecified: Secondary | ICD-10-CM | POA: Diagnosis present

## 2024-04-27 LAB — BLOOD GAS, VENOUS
Acid-Base Excess: 20.4 mmol/L — ABNORMAL HIGH (ref 0.0–2.0)
Bicarbonate: 48.4 mmol/L — ABNORMAL HIGH (ref 20.0–28.0)
Drawn by: 137911
O2 Saturation: 81.8 %
Patient temperature: 37.1
pCO2, Ven: 68 mmHg — ABNORMAL HIGH (ref 44–60)
pH, Ven: 7.46 — ABNORMAL HIGH (ref 7.25–7.43)
pO2, Ven: 47 mmHg — ABNORMAL HIGH (ref 32–45)

## 2024-04-27 LAB — I-STAT VENOUS BLOOD GAS, ED
Acid-Base Excess: 16 mmol/L — ABNORMAL HIGH (ref 0.0–2.0)
Acid-Base Excess: 20 mmol/L — ABNORMAL HIGH (ref 0.0–2.0)
Bicarbonate: 45.4 mmol/L — ABNORMAL HIGH (ref 20.0–28.0)
Bicarbonate: 46.7 mmol/L — ABNORMAL HIGH (ref 20.0–28.0)
Calcium, Ion: 1.02 mmol/L — ABNORMAL LOW (ref 1.15–1.40)
Calcium, Ion: 1.07 mmol/L — ABNORMAL LOW (ref 1.15–1.40)
HCT: 44 % (ref 39.0–52.0)
HCT: 37 % — ABNORMAL LOW (ref 39.0–52.0)
Hemoglobin: 15 g/dL (ref 13.0–17.0)
Hemoglobin: 12.6 g/dL — ABNORMAL LOW (ref 13.0–17.0)
O2 Saturation: 94 %
O2 Saturation: 87 %
Potassium: 3.5 mmol/L (ref 3.5–5.1)
Potassium: 3.9 mmol/L (ref 3.5–5.1)
Sodium: 141 mmol/L (ref 135–145)
Sodium: 135 mmol/L (ref 135–145)
TCO2: 48 mmol/L — ABNORMAL HIGH (ref 22–32)
TCO2: 48 mmol/L — ABNORMAL HIGH (ref 22–32)
pCO2, Ven: 76.9 mmHg (ref 44–60)
pCO2, Ven: 59.2 mmHg (ref 44–60)
pH, Ven: 7.379 (ref 7.25–7.43)
pH, Ven: 7.505 — ABNORMAL HIGH (ref 7.25–7.43)
pO2, Ven: 76 mmHg — ABNORMAL HIGH (ref 32–45)
pO2, Ven: 50 mmHg — ABNORMAL HIGH (ref 32–45)

## 2024-04-27 LAB — I-STAT ARTERIAL BLOOD GAS, ED
Acid-Base Excess: 15 mmol/L — ABNORMAL HIGH (ref 0.0–2.0)
Acid-Base Excess: 18 mmol/L — ABNORMAL HIGH (ref 0.0–2.0)
Bicarbonate: 46.9 mmol/L — ABNORMAL HIGH (ref 20.0–28.0)
Bicarbonate: 47.6 mmol/L — ABNORMAL HIGH (ref 20.0–28.0)
Calcium, Ion: 1.15 mmol/L (ref 1.15–1.40)
Calcium, Ion: 1.15 mmol/L (ref 1.15–1.40)
HCT: 42 % (ref 39.0–52.0)
HCT: 40 % (ref 39.0–52.0)
Hemoglobin: 14.3 g/dL (ref 13.0–17.0)
Hemoglobin: 13.6 g/dL (ref 13.0–17.0)
O2 Saturation: 88 %
O2 Saturation: 98 %
Potassium: 3.5 mmol/L (ref 3.5–5.1)
Potassium: 3.7 mmol/L (ref 3.5–5.1)
Sodium: 141 mmol/L (ref 135–145)
Sodium: 140 mmol/L (ref 135–145)
TCO2: 50 mmol/L — ABNORMAL HIGH (ref 22–32)
TCO2: >50 mmol/L — ABNORMAL HIGH (ref 22–32)
pCO2 arterial: 95.9 mmHg (ref 32–48)
pCO2 arterial: 82.5 mmHg (ref 32–48)
pH, Arterial: 7.297 — ABNORMAL LOW (ref 7.35–7.45)
pH, Arterial: 7.369 (ref 7.35–7.45)
pO2, Arterial: 66 mmHg — ABNORMAL LOW (ref 83–108)
pO2, Arterial: 112 mmHg — ABNORMAL HIGH (ref 83–108)

## 2024-04-27 LAB — BASIC METABOLIC PANEL WITH GFR
Anion gap: 16 — ABNORMAL HIGH (ref 5–15)
BUN: 9 mg/dL (ref 8–23)
CO2: 37 mmol/L — ABNORMAL HIGH (ref 22–32)
Calcium: 8.6 mg/dL — ABNORMAL LOW (ref 8.9–10.3)
Chloride: 91 mmol/L — ABNORMAL LOW (ref 98–111)
Creatinine, Ser: 0.75 mg/dL (ref 0.61–1.24)
GFR, Estimated: >60 mL/min (ref >60)
Glucose, Bld: 133 mg/dL — ABNORMAL HIGH (ref 70–99)
Potassium: 3.6 mmol/L (ref 3.5–5.1)
Sodium: 144 mmol/L (ref 135–145)

## 2024-04-27 LAB — CBC
HCT: 45.1 % (ref 39.0–52.0)
Hemoglobin: 14.1 g/dL (ref 13.0–17.0)
MCH: 31 pg (ref 26.0–34.0)
MCHC: 31.3 g/dL (ref 30.0–36.0)
MCV: 99.1 fL (ref 80.0–100.0)
Platelets: 305 K/uL (ref 150–400)
RBC: 4.55 MIL/uL (ref 4.22–5.81)
RDW: 14.9 % (ref 11.5–15.5)
WBC: 13.2 K/uL — ABNORMAL HIGH (ref 4.0–10.5)
nRBC: 0 % (ref 0.0–0.2)

## 2024-04-27 LAB — HEPATIC FUNCTION PANEL
ALT: 23 U/L (ref 0–44)
AST: 31 U/L (ref 15–41)
Albumin: 2.8 g/dL — ABNORMAL LOW (ref 3.5–5.0)
Alkaline Phosphatase: 112 U/L (ref 38–126)
Bilirubin, Direct: 0.4 mg/dL — ABNORMAL HIGH (ref 0.0–0.2)
Indirect Bilirubin: 0.7 mg/dL (ref 0.3–0.9)
Total Bilirubin: 1.1 mg/dL (ref 0.0–1.2)
Total Protein: 6.8 g/dL (ref 6.5–8.1)

## 2024-04-27 LAB — TROPONIN I (HIGH SENSITIVITY)
Troponin I (High Sensitivity): 33 ng/L — ABNORMAL HIGH (ref <18)
Troponin I (High Sensitivity): 58 ng/L — ABNORMAL HIGH
Troponin I (High Sensitivity): 62 ng/L — ABNORMAL HIGH (ref <18)
Troponin I (High Sensitivity): 49 ng/L — ABNORMAL HIGH (ref <18)

## 2024-04-27 LAB — I-STAT CHEM 8, ED
BUN: 12 mg/dL (ref 8–23)
Calcium, Ion: 1.02 mmol/L — ABNORMAL LOW (ref 1.15–1.40)
Chloride: 92 mmol/L — ABNORMAL LOW (ref 98–111)
Creatinine, Ser: 0.8 mg/dL (ref 0.61–1.24)
Glucose, Bld: 137 mg/dL — ABNORMAL HIGH (ref 70–99)
HCT: 44 % (ref 39.0–52.0)
Hemoglobin: 15 g/dL (ref 13.0–17.0)
Potassium: 3.6 mmol/L (ref 3.5–5.1)
Sodium: 140 mmol/L (ref 135–145)
TCO2: 42 mmol/L — ABNORMAL HIGH (ref 22–32)

## 2024-04-27 LAB — I-STAT CG4 LACTIC ACID, ED: Lactic Acid, Venous: 1.3 mmol/L (ref 0.5–1.9)

## 2024-04-27 LAB — AMMONIA: Ammonia: 79 umol/L — ABNORMAL HIGH (ref 9–35)

## 2024-04-27 LAB — PHENYTOIN LEVEL, TOTAL: Phenytoin Lvl: 22.2 ug/mL — ABNORMAL HIGH (ref 10.0–20.0)

## 2024-04-27 LAB — PROTIME-INR
INR: 2.2 — ABNORMAL HIGH (ref 0.8–1.2)
Prothrombin Time: 25.2 s — ABNORMAL HIGH (ref 11.4–15.2)

## 2024-04-27 LAB — TSH: TSH: 0.711 u[IU]/mL (ref 0.350–4.500)

## 2024-04-27 LAB — BRAIN NATRIURETIC PEPTIDE: B Natriuretic Peptide: 169.8 pg/mL — ABNORMAL HIGH (ref 0.0–100.0)

## 2024-04-27 LAB — PROCALCITONIN: Procalcitonin: 0.98 ng/mL

## 2024-04-27 MED ORDER — SODIUM CHLORIDE 0.9 % IV SOLN: 2.0000 g | INTRAVENOUS | Status: DC

## 2024-04-27 MED ORDER — FUROSEMIDE 10 MG/ML IJ SOLN
80.0000 mg | Freq: Two times a day (BID) | INTRAMUSCULAR | Status: DC
  Administered 2024-04-27 – 2024-04-28 (×3): 80 mg via INTRAVENOUS
  Filled 2024-04-27 (×3): qty 8

## 2024-04-27 MED ORDER — ROSUVASTATIN CALCIUM 20 MG PO TABS
40.0000 mg | ORAL_TABLET | Freq: Every day | ORAL | Status: DC
  Administered 2024-04-27 – 2024-04-28 (×2): 40 mg via ORAL
  Filled 2024-04-27 (×2): qty 2

## 2024-04-27 MED ORDER — SODIUM CHLORIDE 0.9 % IV BOLUS
500.0000 mL | Freq: Once | INTRAVENOUS | Status: AC
  Administered 2024-04-27: 500 mL via INTRAVENOUS

## 2024-04-27 MED ORDER — SODIUM CHLORIDE 0.9 % IV SOLN
500.0000 mg | INTRAVENOUS | Status: DC
  Administered 2024-04-28 – 2024-04-29 (×2): 500 mg via INTRAVENOUS
  Filled 2024-04-27 (×3): qty 5

## 2024-04-27 MED ORDER — WARFARIN - PHARMACIST DOSING INPATIENT: Freq: Every day | Status: DC

## 2024-04-27 MED ORDER — FLUTICASONE FUROATE-VILANTEROL 100-25 MCG/ACT IN AEPB
1.0000 | INHALATION_SPRAY | Freq: Every day | RESPIRATORY_TRACT | Status: DC
  Administered 2024-04-28 – 2024-04-29 (×2): 1 via RESPIRATORY_TRACT
  Filled 2024-04-27: qty 28

## 2024-04-27 MED ORDER — SODIUM CHLORIDE 0.9 % IV SOLN
500.0000 mg | Freq: Once | INTRAVENOUS | Status: AC
  Administered 2024-04-27: 500 mg via INTRAVENOUS
  Filled 2024-04-27: qty 5

## 2024-04-27 MED ORDER — SODIUM CHLORIDE 0.9 % IV SOLN
2.0000 g | Freq: Once | INTRAVENOUS | Status: AC
  Administered 2024-04-27: 2 g via INTRAVENOUS
  Filled 2024-04-27: qty 20

## 2024-04-27 MED ORDER — ACETAMINOPHEN 650 MG RE SUPP
650.0000 mg | Freq: Four times a day (QID) | RECTAL | Status: DC | PRN
Start: 2024-04-27 — End: 2024-04-29

## 2024-04-27 MED ORDER — IPRATROPIUM-ALBUTEROL 0.5-2.5 (3) MG/3ML IN SOLN: 3.0000 mL | Freq: Four times a day (QID) | RESPIRATORY_TRACT | Status: DC

## 2024-04-27 MED ORDER — EMPAGLIFLOZIN 10 MG PO TABS
10.0000 mg | ORAL_TABLET | Freq: Every day | ORAL | Status: DC
  Administered 2024-04-28 – 2024-04-29 (×2): 10 mg via ORAL
  Filled 2024-04-27 (×2): qty 1

## 2024-04-27 MED ORDER — PHENYTOIN SODIUM EXTENDED 100 MG PO CAPS
200.0000 mg | ORAL_CAPSULE | Freq: Two times a day (BID) | ORAL | Status: DC
  Administered 2024-04-27 – 2024-04-29 (×4): 200 mg via ORAL
  Filled 2024-04-27 (×5): qty 2

## 2024-04-27 MED ORDER — FUROSEMIDE 10 MG/ML IJ SOLN: 80.0000 mg | Freq: Once | INTRAMUSCULAR | Status: DC

## 2024-04-27 MED ORDER — PHENYTOIN 50 MG PO CHEW
50.0000 mg | CHEWABLE_TABLET | Freq: Every day | ORAL | Status: DC
  Filled 2024-04-27: qty 1

## 2024-04-27 MED ORDER — ACETAMINOPHEN 325 MG PO TABS: 650.0000 mg | ORAL_TABLET | Freq: Four times a day (QID) | ORAL | Status: DC | PRN

## 2024-04-27 MED ORDER — WARFARIN SODIUM 2 MG PO TABS
4.0000 mg | ORAL_TABLET | Freq: Once | ORAL | Status: AC
  Filled 2024-04-27: qty 1

## 2024-04-27 MED ORDER — SODIUM CHLORIDE 0.9 % IV SOLN: 500.0000 mg | INTRAVENOUS | Status: DC

## 2024-04-27 NOTE — ED Notes (Signed)
 Pt not tolerating PO meds at this time due to increased lethargy

## 2024-04-27 NOTE — Progress Notes (Signed)
 Pt transported to 4E 26 from ED TraumaA on bipap with RRT and RN. Pt somnolent, MD aware.

## 2024-04-27 NOTE — Progress Notes (Addendum)
 PHARMACY - ANTICOAGULATION CONSULT NOTE  Pharmacy Consult for warfarin dosing by Pharmacy Indication: atrial fibrillation, previous stroke   Allergies  Allergen Reactions   Fenofibrate Other (See Comments)    Reaction not listed on MAR     Patient Measurements:    Vital Signs: Temp: 97.8 F (36.6 C) (11/19 0910) Temp Source: Axillary (11/19 0910) BP: 94/68 (11/19 1215) Pulse Rate: 94 (11/19 1215)  Labs: Recent Labs    04/27/24 0907 04/27/24 0914 04/27/24 0917 04/27/24 0924 04/27/24 1036  HGB 14.1 15.0 15.0 14.3 13.6  HCT 45.1 44.0 44.0 42.0 40.0  PLT 305  --   --   --   --   LABPROT 25.2*  --   --   --   --   INR 2.2*  --   --   --   --   CREATININE 0.75 0.80  --   --   --   TROPONINIHS 33*  --   --   --   --     CrCl cannot be calculated (Unknown ideal weight.).   Medical History: Past Medical History:  Diagnosis Date   Acute on chronic congestive heart failure (HCC)    Anemia, secondary    SECONDARY TO ACUTE BLOOD LOSS   Anxiety disorder    Bloody stool 08/29/2011   intermittent along with constipation.    CVA (cerebral vascular accident) (HCC) 2010   large left MCA stroke with right hemiparesis   Depression    DVT of lower extremity (deep venous thrombosis) (HCC)    RIGHT LOWER; s/p IVC filter 7/12   Dysphagia    Hyperlipidemia    Hypertension    Lupus anticoagulant disorder 1990   Morbid obesity (HCC)    PFO (patent foramen ovale)    TEE 2/10: EF 60%, trivial AI, mild Ao root dilatation, mod PFO with R-L shunting, atrial septal aneurysm;   echo 7/12: EF 65-70%, grade 1 diast dysfxn, mild MR, LVOT showed severe obstruction   Protein C deficiency    Protein S deficiency    Rectus sheath hematoma 7/12   required reversal of anticoagulation and c/b DVT req. IVC filter   Seizure disorder (HCC)     Medications:  Reviewed potential drug-drug interactions from SNF to include all medications listed/cited on the prior to admission medications. In his  SNF, taking:  Crestor , Dilantin , Torsemide  and trazodone  with warfarin all may increase the anticoagulant effect. Cymbalta , may increase the serum concentration of warfarin (thus, still--a potential for increased INR.)  INR 2.2 @ 0907 11/19>>spoke to SNF [facility]: RE: when queried re: his warfarin dose:  4 mg, though Med Rec says 2.5 mg + 1  mg for total 3.5 mg per day.  Assessment: On warfarin prior to admission. INR at 0907 04/27/24 reported as 2.2 Goal of Therapy:  INR 2-3 if warfarin is used; if CIUFH is used--will target HL of 0.3 - 0.7 units/mL; if LMWH is used SQ, will target a LMWH heparin  level of 0.5 - 1.0 unit/mL. Monitor platelets by anticoagulation protocol: Yes   Plan:  Will await confirmation from the medication administration record (MAR) from the SNF to finalize initiating dose of warfarin (3.5 mg vs. 4 mg). Additionally, just apprised by the Senior Resident, Dr. Noorudin that patient currently has BiPAP in place, indicating use of ORAL warfarin is not a current option. Discussed with Dr. Noorudin an interesting paper by Claudene dunker al--out of [facility]. Years go using 1 mg per kg of enoxaparin .  A second citation by Dr. Eudora Rouleau et al out of [facility] used a similar approach with doses ranging from 0.75 mg to 1 mg of enoxaparin  per kilogram, SQ every 12 hours. If this option is used (vs. CIUFH) a 4 hour low-molecular-weight-heparin  level can be determined to demonstrate efficacy and safety (Target range for LMWH enoxaparin /Lovenox  at 4th hour is 0.5 - 1.0 units/mL).   Patient currently being re-evaluated clinically at the bedside to determine if BiPAP can be discontinued/removed that will permit usual oral dosing of warfarin. Will await the final determination of Dr. Noorudin to finalize my plan.   UPDATE: 1345 hours 04/27/24: Physician at bedside (Dr. Noorudin) indicates increased wakefulness permitting an off trial of BiPAP. Will proceed with oral  warfarin. Given his baseline INR of 2.2 04/27/24, I will initiate dosing with the 4 mg strength warfarin tablet.   Lynwood KATHEE Lites, PharmD, CPP Clinical Pharmacist Practitioner 04/27/2024,12:42 PM

## 2024-04-27 NOTE — Hospital Course (Addendum)
 Worse than previously. Pretty obtunded when he came in, tried BiPAP, having some sow improvement, nodding yes or no and trying to answer questions. Expect to continue to improve. Starting on him antbibotic   Seen him yesterday and was talking just fine. Overnight his O2 saturation was dropping, turned up his oxygen, was able to maintain him at 89-90%, monitored pretty often. Went back in around [place], he couldn't open his eyes and respond. Was sweating profusely, blood sugar was within range. Stopped talking. He wears it when he wants to, doesn't wear it sometimes. Nr was 2.3, tested for COVID it was negative, does have covid in the building. Was talking turned him to have his medication off. Full code, couldn't get him aroused. Very not compiant with his fluid intake. Drinks all day longs, goes to vending machine, asks people to buy him sodas. Knows about his restrictions.   On 4mg  of coumadin  right now. Does his INR once a week because they manage his coumadin . In the faiclity was using 6L when discharged, and was able to get him down to stable at 4. Has sepcialized tank that takes him up to 10. Has been up in his chair and riding around. Only feeds himself, and can move himself in wheelchair because it's electricul. Dependent in care until he gets up in his wheelchair, feeds himself.

## 2024-04-27 NOTE — Progress Notes (Signed)
 Issues with Bipap seal.  RT called and replaced mask ,but problem persists.  Requesting RT support again.

## 2024-04-27 NOTE — ED Provider Notes (Signed)
 [place] EMERGENCY DEPARTMENT AT [facility] Provider Note   CSN: [id number] Arrival date & time: 04/27/24  9097     Patient presents with: Respiratory Distress   James Villegas is a 68 y.o. male.   68 yo M with a cc of ams. Started reportedly this morning.  Hx obtained by EMS as patient with confused verbal response.  No obvious cough, congestion.  Found to be hypoxic with EMS, started on O2, Mg solumedrol, given nebs with reported improvement.         Prior to Admission medications   Medication Sig Start Date End Date Taking? Authorizing Provider  acetaminophen  (TYLENOL ) 325 MG tablet Take 650 mg by mouth every 6 (six) hours as needed for mild pain, headache or fever.    [provider]  antiseptic oral rinse (BIOTENE) LIQD 1 Application by Mouth Rinse route at bedtime.    [provider]  ARTIFICIAL TEAR OP Place 1 drop into both eyes in the morning, at noon, in the evening, and at bedtime.    [provider]  Baclofen  5 MG TABS Take 5 mg by mouth at bedtime. 02/19/23   [provider]  bisacodyl  (DULCOLAX) 5 MG EC tablet Take 10 mg by mouth daily as needed for moderate constipation.    [provider]  Carboxymeth-Glycerin -Polysorb (REFRESH OPTIVE ADVANCED) 0.5-1-0.5 % SOLN Place 1 drop into both eyes in the morning, at noon, in the evening, and at bedtime.    [provider]  cycloSPORINE  (RESTASIS ) 0.05 % ophthalmic emulsion Place 1 drop into both eyes 2 (two) times daily.    [provider]  Dentifrices (BIOTENE DRY MOUTH GENTLE) PSTE Place 1 Application onto teeth at bedtime.    [provider]  DULoxetine  (CYMBALTA ) 30 MG capsule Take 30 mg by mouth in the morning. 08/31/23   [provider]  empagliflozin  (JARDIANCE ) 10 MG TABS tablet Take 10 mg by mouth daily.    [provider]  ezetimibe  (ZETIA ) 10 MG tablet Take 10 mg by mouth daily.    [provider]   fluticasone -salmeterol (ADVAIR) 250-50 MCG/ACT AEPB Inhale 1 puff into the lungs in the morning and at bedtime.    [provider]  guaiFENesin  (ROBITUSSIN) 100 MG/5ML liquid Take 15 mLs by mouth every 4 (four) hours as needed for cough.    [provider]  hydrocortisone  cream 1 % Apply 1 Application topically daily. Apply to right neck for 7 days starting on 12/18/23.    [provider]  ipratropium-albuterol  (DUONEB) 0.5-2.5 (3) MG/3ML SOLN Take 3 mLs by nebulization every 4 (four) hours as needed. 10/23/23   Elnora Ip, MD  nystatin  (MYCOSTATIN /NYSTOP ) powder Apply topically 3 (three) times daily. 12/23/23   Myrna Bitters, DO  omeprazole  (PRILOSEC) 20 MG capsule Take 20 mg by mouth every other day. 08/01/21   [provider]  OXYGEN Inhale 4 L into the lungs continuous.    [provider]  phenytoin  (DILANTIN ) 100 MG ER capsule Take 200 mg by mouth 2 (two) times daily.    [provider]  phenytoin  (DILANTIN ) 50 MG tablet Chew 1 tablet (50 mg total) by mouth at bedtime. 12/23/23   King, Olivia, DO  rosuvastatin  (CRESTOR ) 40 MG tablet Take 40 mg by mouth at bedtime.    [provider]  senna-docusate (SENOKOT-S) 8.6-50 MG tablet Take 1 tablet by mouth at bedtime as needed for mild constipation. Patient taking differently: Take 1 tablet by mouth daily  as needed for mild constipation. 12/11/23   Elnora Ip, MD  spironolactone  (ALDACTONE ) 25 MG tablet Take 1 tablet (25 mg total) by mouth daily. Patient not taking: Reported on 12/19/2023 09/22/21 12/10/24  Willette Adriana LABOR, MD  torsemide  (DEMADEX ) 20 MG tablet Take 40 mg by mouth in the morning and at bedtime. Patient not taking: Reported on 12/19/2023 08/31/23   [provider]  traZODone  (DESYREL ) 50 MG tablet Take 50 mg by mouth at bedtime. 08/01/21   [provider]  valACYclovir (VALTREX) 1000 MG tablet Take 1,000 mg by mouth 2 (two) times daily.     [provider]  warfarin (COUMADIN ) 1 MG tablet Take 1 mg by mouth daily. Take along with 2.5mg  tablet for a daily dose of 3.5mg     [provider]  warfarin (COUMADIN ) 2.5 MG tablet Take 2.5 mg by mouth daily.    [provider]    Allergies: Fenofibrate    Review of Systems  Updated Vital Signs BP 94/68   Pulse 94   Temp 97.8 F (36.6 C) (Axillary)   Resp (!) 24   SpO2 100%   Physical Exam Vitals and nursing note reviewed.  Constitutional:      Appearance: He is well-developed.  HENT:     Head: Normocephalic and atraumatic.  Eyes:     Pupils: Pupils are equal, round, and reactive to light.  Neck:     Vascular: No JVD.  Cardiovascular:     Rate and Rhythm: Normal rate and regular rhythm.     Heart sounds: No murmur heard.    No friction rub. No gallop.  Pulmonary:     Effort: No respiratory distress.     Breath sounds: No wheezing.  Abdominal:     General: There is no distension.     Tenderness: There is no abdominal tenderness. There is no guarding or rebound.  Musculoskeletal:        General: Normal range of motion.     Cervical back: Normal range of motion and neck supple.     Right lower leg: Edema present.     Left lower leg: Edema present.     Comments: RLE edema greater than L  Skin:    Coloration: Skin is not pale.     Findings: No rash.  Neurological:     Comments: Opens eyes to pain, confused verbal response.  Psychiatric:        Behavior: Behavior normal.     (all labs ordered are listed, but only abnormal results are displayed) Labs Reviewed  BASIC METABOLIC PANEL WITH GFR - Abnormal; Notable for the following components:      Result Value   Chloride 91 (*)    CO2 37 (*)    Glucose, Bld 133 (*)    Calcium  8.6 (*)    Anion gap 16 (*)    All other components within normal limits  CBC - Abnormal; Notable for the following components:   WBC 13.2 (*)    All other components within normal limits  BRAIN NATRIURETIC  PEPTIDE - Abnormal; Notable for the following components:   B Natriuretic Peptide 169.8 (*)    All other components within normal limits  HEPATIC FUNCTION PANEL - Abnormal; Notable for the following components:   Albumin 2.8 (*)    Bilirubin, Direct 0.4 (*)    All other components within normal limits  AMMONIA - Abnormal; Notable for the following components:   Ammonia 79 (*)    All  other components within normal limits  PROTIME-INR - Abnormal; Notable for the following components:   Prothrombin Time 25.2 (*)    INR 2.2 (*)    All other components within normal limits  I-STAT CHEM 8, ED - Abnormal; Notable for the following components:   Chloride 92 (*)    Glucose, Bld 137 (*)    Calcium , Ion 1.02 (*)    TCO2 42 (*)    All other components within normal limits  I-STAT VENOUS BLOOD GAS, ED - Abnormal; Notable for the following components:   pCO2, Ven 76.9 (*)    pO2, Ven 76 (*)    Bicarbonate 45.4 (*)    TCO2 48 (*)    Acid-Base Excess 16.0 (*)    Calcium , Ion 1.02 (*)    All other components within normal limits  I-STAT ARTERIAL BLOOD GAS, ED - Abnormal; Notable for the following components:   pH, Arterial 7.297 (*)    pCO2 arterial 95.9 (*)    pO2, Arterial 66 (*)    Bicarbonate 46.9 (*)    TCO2 50 (*)    Acid-Base Excess 15.0 (*)    All other components within normal limits  I-STAT ARTERIAL BLOOD GAS, ED - Abnormal; Notable for the following components:   pCO2 arterial 82.5 (*)    pO2, Arterial 112 (*)    Bicarbonate 47.6 (*)    TCO2 >50 (*)    Acid-Base Excess 18.0 (*)    All other components within normal limits  I-STAT VENOUS BLOOD GAS, ED - Abnormal; Notable for the following components:   pH, Ven 7.505 (*)    pO2, Ven 50 (*)    Bicarbonate 46.7 (*)    TCO2 48 (*)    Acid-Base Excess 20.0 (*)    Calcium , Ion 1.07 (*)    HCT 37.0 (*)    Hemoglobin 12.6 (*)    All other components within normal limits  TROPONIN I (HIGH SENSITIVITY) - Abnormal; Notable for the  following components:   Troponin I (High Sensitivity) 33 (*)    All other components within normal limits  CULTURE, BLOOD (ROUTINE X 2)  CULTURE, BLOOD (ROUTINE X 2)  TSH  BLOOD GAS, ARTERIAL  PROCALCITONIN  PHENYTOIN  LEVEL, TOTAL  BLOOD GAS, ARTERIAL  I-STAT CG4 LACTIC ACID, ED    EKG: EKG Interpretation Date/Time:  Wednesday April 27 2024 09:17:05 EST Ventricular Rate:  85 PR Interval:    QRS Duration:  150 QT Interval:  416 QTC Calculation: 495 R Axis:   95  Text Interpretation: Atrial fibrillation RBBB and LPFB Repol abnrm suggests ischemia, diffuse leads No significant change since last tracing Confirmed by Emil Share [phone]) on 04/27/2024 9:19:13 AM  Radiology: [facility] Chest Port 1 View Result Date: 04/27/2024 CLINICAL DATA:  Shortness of breath. EXAM: PORTABLE CHEST 1 VIEW COMPARISON:  Chest CT dated 12/19/2023. FINDINGS: Cardiomegaly with vascular congestion. Relatively similar asymmetric hazy density in the left lung may represent asymmetric edema or pneumonia. Overall improved interstitial markings since the prior radiograph. Possible small bilateral pleural effusions. No pneumothorax. No acute osseous pathology. IMPRESSION: 1. Cardiomegaly with vascular congestion and improved interstitial edema. 2. Asymmetric edema or pneumonia in the left lung. Electronically Signed   By: Vanetta Chou M.D.   On: 04/27/2024 10:18     .Critical Care  Performed by: Emil Share, DO Authorized by: Emil Share, DO   Critical care provider statement:    Critical care time (minutes):  80   Critical care time was exclusive  of:  Separately billable procedures and treating other patients   Critical care was time spent personally by me on the following activities:  Development of treatment plan with patient or surrogate, discussions with consultants, evaluation of patient's response to treatment, examination of patient, ordering and review of laboratory studies, ordering and review of  radiographic studies, ordering and performing treatments and interventions, pulse oximetry, re-evaluation of patient's condition and review of old charts   Care discussed with: admitting provider      Medications Ordered in the ED  acetaminophen  (TYLENOL ) tablet 650 mg (has no administration in time range)    Or  acetaminophen  (TYLENOL ) suppository 650 mg (has no administration in time range)  furosemide  (LASIX ) injection 80 mg (has no administration in time range)  phenytoin  (DILANTIN ) ER capsule 200 mg (has no administration in time range)  phenytoin  (DILANTIN ) chewable tablet 50 mg (has no administration in time range)  warfarin (COUMADIN ) tablet 4 mg (has no administration in time range)  Warfarin - Pharmacist Dosing Inpatient (has no administration in time range)  azithromycin  (ZITHROMAX ) 500 mg in sodium chloride  0.9 % 250 mL IVPB (has no administration in time range)  cefTRIAXone  (ROCEPHIN ) 2 g in sodium chloride  0.9 % 100 mL IVPB (has no administration in time range)  fluticasone  furoate-vilanterol (BREO ELLIPTA ) 100-25 MCG/ACT 1 puff (has no administration in time range)  cefTRIAXone  (ROCEPHIN ) 2 g in sodium chloride  0.9 % 100 mL IVPB (2 g Intravenous New Bag/Given 04/27/24 1231)  azithromycin  (ZITHROMAX ) 500 mg in sodium chloride  0.9 % 250 mL IVPB (500 mg Intravenous New Bag/Given 04/27/24 1232)  sodium chloride  0.9 % bolus 500 mL (500 mLs Intravenous New Bag/Given 04/27/24 1100)                                    Medical Decision Making Amount and/or Complexity of Data Reviewed Labs: ordered. Radiology: ordered.  Risk Decision regarding hospitalization.   68 yo M with a cc of ams.  Reportedly having some trouble breathing this morning and then became unresponsive.    Entirety of history obtained by EMS.  Records reviewed, multiple prior admissions for hypercarbic respiratory failure.   Responsive to pain initially.  Right leg edema much larger than L. On coumadin  for  lupus anticoagulant.   Start on bipap.  CXR, labs, reassess.   Chest x-ray with some asymmetric findings though on my independent interpretation looks similar to the last x-rays.  Radiology read with possible pneumonia.  With limited history will start on antibiotics.  Patient having some improvement of his hypercarbia on repeat ABG.  Having some improvement of his mental status.  Will discuss with medicine.  The patients results and plan were reviewed and discussed.   Any x-rays performed were independently reviewed by myself.   Differential diagnosis were considered with the presenting HPI.  Medications  acetaminophen  (TYLENOL ) tablet 650 mg (has no administration in time range)    Or  acetaminophen  (TYLENOL ) suppository 650 mg (has no administration in time range)  furosemide  (LASIX ) injection 80 mg (has no administration in time range)  phenytoin  (DILANTIN ) ER capsule 200 mg (has no administration in time range)  phenytoin  (DILANTIN ) chewable tablet 50 mg (has no administration in time range)  warfarin (COUMADIN ) tablet 4 mg (has no administration in time range)  Warfarin - Pharmacist Dosing Inpatient (has no administration in time range)  azithromycin  (ZITHROMAX ) 500 mg in sodium chloride  0.9 %  250 mL IVPB (has no administration in time range)  cefTRIAXone  (ROCEPHIN ) 2 g in sodium chloride  0.9 % 100 mL IVPB (has no administration in time range)  fluticasone  furoate-vilanterol (BREO ELLIPTA ) 100-25 MCG/ACT 1 puff (has no administration in time range)  cefTRIAXone  (ROCEPHIN ) 2 g in sodium chloride  0.9 % 100 mL IVPB (2 g Intravenous New Bag/Given 04/27/24 1231)  azithromycin  (ZITHROMAX ) 500 mg in sodium chloride  0.9 % 250 mL IVPB (500 mg Intravenous New Bag/Given 04/27/24 1232)  sodium chloride  0.9 % bolus 500 mL (500 mLs Intravenous New Bag/Given 04/27/24 1100)    Vitals:   04/27/24 0910 04/27/24 0955 04/27/24 1035 04/27/24 1215  BP:    94/68  Pulse:    94  Resp:    (!) 24   Temp: 97.8 F (36.6 C)     TempSrc: Axillary     SpO2: 95% 95% 100% 100%    Final diagnoses:  Acute on chronic respiratory failure with hypercapnia (HCC)    Admission/ observation were discussed with the admitting physician, patient and/or family and they are comfortable with the plan.       Final diagnoses:  Acute on chronic respiratory failure with hypercapnia [facility])    ED Discharge Orders     None          Emil Share, DO 04/27/24 1402

## 2024-04-27 NOTE — Progress Notes (Signed)
 Called by RN because mask wouldn't seal.  Changed mask to XL and adjusted appropriately.  Pt resting comfortably on bipap at this time.

## 2024-04-27 NOTE — ED Triage Notes (Signed)
 Pt BIB EMS from [facility] facility, pt was lethargic and only responsive to pain starting around 0730. EMS unable to hear lung sounds before meds. Normally AOX4. Pt believed to be in fluid overload, CHF. 2 Mag, 10 Albuterol , 1 atrovent, solumedrol.

## 2024-04-27 NOTE — H&P (Signed)
 Date: 04/27/2024               Patient Name:  James Villegas MRN: [id number]  DOB: 05/26/1956 Age / Sex: 68 y.o., male   PCP: Feliciano Devoria LABOR, MD         Medical Service: Internal Medicine Teaching Service         Attending Physician: Dr. Lovie Clarity, MD      First Contact: Dr. Schuyler Novak, DO    Second Contact: Dr. Missy Sandhoff, MD         After Hours (After 5p/  First Contact Pager: [phone]  weekends / holidays): Second Contact Pager: [phone]   SUBJECTIVE   Chief Complaint: AMS  History of Present Illness:   Mr. Yovani Cogburn is a 68 year old male with past medical history chronic hypercarbic respiratory failure on 4-6L of oxygen, left sided CVA with right hemiplegia, Atrial fibrillation, lupus anticoagulant on warfarin who is presenting from [facility] for altered mental status. Patient currently is able to nod and open his eyes, but is not speaking. History below is obtained from [facility].   Per nurse at [facility], Mr. Corella was doing well yesterday, he was moving around in his electric scooter, and socializing with staff and other residents at [facility].  Around 5:00 this morning, staff noticed that his O2 saturations were dropping to the low 80s, so they subsequently increased his oxygen.  They also noticed that he was not responding to verbal commands, or arousable.  He was also sweating profusely, so they checked a blood glucose which was within normal limits.  They reached out to the nurse practitioner on-call, who recommended calling emergency medical services who transported him to the emergency department.  Per nursing, he has not had any complaints of being sick, which include any fevers, chills, cough.  At [facility], he has his BiPAP machine which at his last hospitalization it was heavily emphasized how much he needs to wear it, especially at night.  Per nursing, he wears it when he wants however they do not know when the last time he did wear it was.  Nursing  staff also notes that he is very noncompliant with his restricted sodium diet.  He drinks a lot of sodas, and is eats a lot of food from other residents and from the vending machine. There is a COVID outbreak currently in [place] as well, and reportedly he was swabbed and tested negative.  Facility confirmed that he uses 4L of oxygen at [place], and is dependent in almost all ADLs/IADLs except feeding himself.   Of note, he has had multiple hospitalizations (most recent being 7/12-7/14) for acute on chronic hypoxic and hypercarbic respiratory failure, which was attributed to him not being able to wear his BiPAP   Meds:  Baclofen  5mg   Cymbalta  30mg   Zetia  10mg   Advair 250-50mcg Duoneb PRN  Jardiance  10mg   Nystatin  Powder Dilantin  200mg  BID  Crestor  40mg   Torsemide  40mg   Trazodone  50mg   Valtrex 1000mg  BID Warfarin 4mg    Past Medical History  Past Surgical History:  Procedure Laterality Date   dental extraction     multiple   vena cavogram  12/2010   INFERIOR    Social:  Lives With: At [place] Occupation: Does not work  Support: Sport and exercise psychologist Level of Function: Dependent in all ADLs/IADLs except feeding himself which he can do independently PCP: Dr. Feliciano, MD Substances: Unable to obtain  Family History: Unable to obtain   Allergies: Allergies as of 04/27/2024 -  Review Complete 04/27/2024  Allergen Reaction Noted   Fenofibrate Other (See Comments) 02/18/2011    Review of Systems: A complete ROS was negative except as per HPI.   OBJECTIVE:   Physical Exam: Blood pressure 126/88, pulse 77, temperature 97.8 F (36.6 C), temperature source Axillary, resp. rate (!) 24, SpO2 100%.  Constitutional: On bipap, opens his eyes and nods when responding to questions HENT: normocephalic atraumatic, mucous membranes moist Eyes: conjunctiva non-erythematous Neck: supple Cardiovascular: irregular rate and rhythm, 2+ pitting edema in lower extremities  bilaterally Pulmonary/Chest: On BiPap machine, decreased breath sounds throughout lung field, mild crackles heard, no wheezes Abdominal: soft, non-tender, non-distended MSK: normal bulk and tone Neurological: Opens eyes to painful stimuli and loud voices Skin: warm and dry Psych: Unable to assess  Labs: CBC    Component Value Date/Time   WBC 13.2 (H) 04/27/2024 0907   RBC 4.55 04/27/2024 0907   HGB 13.6 04/27/2024 1036   HGB 13.2 08/01/2022 1019   HGB 14.1 12/27/2012 1339   HCT 40.0 04/27/2024 1036   HCT 39.2 08/01/2022 1019   HCT 41.4 12/27/2012 1339   PLT 305 04/27/2024 0907   PLT 227 08/01/2022 1019   MCV 99.1 04/27/2024 0907   MCV 92 08/01/2022 1019   MCV 89.2 12/27/2012 1339   MCH 31.0 04/27/2024 0907   MCHC 31.3 04/27/2024 0907   RDW 14.9 04/27/2024 0907   RDW 12.9 08/01/2022 1019   RDW 13.8 12/27/2012 1339   LYMPHSABS 0.4 (L) 12/19/2023 0940   LYMPHSABS 1.9 12/27/2012 1339   MONOABS 1.4 (H) 12/19/2023 0940   MONOABS 0.6 12/27/2012 1339   EOSABS 0.5 12/19/2023 0940   EOSABS 0.4 12/27/2012 1339   BASOSABS 0.2 (H) 12/19/2023 0940   BASOSABS 0.0 12/27/2012 1339     CMP     Component Value Date/Time   NA 140 04/27/2024 1036   NA 143 08/01/2022 1019   NA 139 12/27/2012 1339   K 3.7 04/27/2024 1036   K 3.8 12/27/2012 1339   CL 92 (L) 04/27/2024 0914   CL 109 (H) 04/26/2012 1453   CO2 37 (H) 04/27/2024 0907   CO2 26 12/27/2012 1339   GLUCOSE 137 (H) 04/27/2024 0914   GLUCOSE 80 12/27/2012 1339   GLUCOSE 99 04/26/2012 1453   BUN 12 04/27/2024 0914   BUN 12 08/01/2022 1019   BUN 9.4 12/27/2012 1339   CREATININE 0.80 04/27/2024 0914   CREATININE 0.7 12/27/2012 1339   CALCIUM  8.6 (L) 04/27/2024 0907   CALCIUM  8.9 12/27/2012 1339   PROT 6.8 04/27/2024 0907   PROT 6.0 07/15/2019 1055   PROT 6.6 12/27/2012 1339   ALBUMIN 2.8 (L) 04/27/2024 0907   ALBUMIN 3.6 (L) 07/15/2019 1055   ALBUMIN 3.3 (L) 12/27/2012 1339   AST 31 04/27/2024 0907   AST 19 12/27/2012  1339   ALT 23 04/27/2024 0907   ALT 34 12/27/2012 1339   ALKPHOS 112 04/27/2024 0907   ALKPHOS 122 12/27/2012 1339   BILITOT 1.1 04/27/2024 0907   BILITOT <0.2 07/15/2019 1055   BILITOT 0.20 12/27/2012 1339   GFRNONAA >60 04/27/2024 0907   GFRAA >60 02/28/2020 0435    Imaging:  Chest X-Ray: Cardiomegaly with vascular congestion and improved interstitial edema. Asymmetic edema or pneumonia in left lung  EKG: personally reviewed my interpretation is irregular rate and rhythm, similar to previous EKG.   ASSESSMENT & PLAN:   Assessment & Plan by Problem: Principal Problem:   Acute respiratory failure with hypoxia and hypercapnia (HCC)  KERRINGTON GREENHALGH is a 68 y.o. person living with a history of chronic hypoxic respiratory failure on 4-6L of oxygen, HFpEF, previous CVA with right sided hemplegia. HLD, HTN who presented with altered mental status and admitted for hypercarbic respiratory failure on hospital day 0  #Acute on Chronic Hypoxic and Hypercarbic Respiratory Failure  #Pulmonary Edema #Chronic Respiratory Failure on 4L  #Severe COPD #HFpEF Pt admitted for respiratory failure for the fourth time in the last two months. Intermittently compliant with his BiPAP. Unclear if he is still smoking cigarettes, which would certainly be contributing.  Initial ABG showed a pH of 7.297, and a pCO2 of 95.9.  Subsequent ABG showed a pH of 7.369, and pCO2 of 82.5.  He is more awake now, responding more, and requesting the BiPAP to be off of him.  Altered mental status likely in the setting of hypercarbia.  Etiology likely multifactorial in the setting of volume overload, with questionable pneumonia. He has been given a dose of ceftriaxone  and azithromycin  for community-acquired pneumonia coverage, with his white blood cell count elevated at 13.2, and episode of shaking and tremulousness this morning.  Clear evidence of volume overload on exam, and on imaging. Plan: - Continue antibiotic coverage  for pneumonia - Diuresis with IV Lasix  80 mg BID - Strict ins and outs - Checking procalcitonin - Trialing off BiPAP, his home oxygen is 4 to 6 L - Need to discuss his use of BiPAP at home and how important it is to support his ventilation requirements - Checking respiratory viral panel -Restarted Jardiance   #Atrial Fibrillation/Atrial Flutter  #Protein C&S Deficiency  #Lupus Anticoagulant Remains in A-fib on exam and on EKG, anticoagulated with warfarin.  Will trial off BiPAP if he is able to tolerate p.o. then will resume warfarin, if not then we will use Lovenox  for bridging.  Last INR checked this morning was 2.1, which is in his therapeutic range.  Plan:  - Warfarin per pharmacy  #Epilepsy  Continuing his Dilantin  dosaging, checking Dilantin  level with concerns for altered mental status by facility.  #Depression  #Insomnia Holding his duloxetine  and trazodone  with concerns for AMS, can resume when pt is back to baseline.  #Left Sided CVA with Right Sided Deficits in 2010 Restarted Crestor    Diet: Normal VTE: Warfarin IVF: None,None Code: Full  Prior to Admission Living Arrangement: [facility] Anticipated Discharge Location: [facility] Barriers to Discharge: Medical Management  Dispo: Admit patient to Inpatient with expected length of stay greater than 2 midnights.  Signed: Kasiyah Platter, MD Internal Medicine Resident PGY-3  04/27/2024, 12:45 PM

## 2024-04-27 NOTE — Progress Notes (Signed)
 Pt received from ED on BiPAP, arousable by touch but very sleepy.  VS taken, CCMD notified, and plan of care reviewed with NT.

## 2024-04-28 ENCOUNTER — Inpatient Hospital Stay (HOSPITAL_COMMUNITY)

## 2024-04-28 DIAGNOSIS — B9683 Acinetobacter baumannii as the cause of diseases classified elsewhere: Secondary | ICD-10-CM | POA: Diagnosis not present

## 2024-04-28 DIAGNOSIS — J9602 Acute respiratory failure with hypercapnia: Secondary | ICD-10-CM | POA: Diagnosis not present

## 2024-04-28 DIAGNOSIS — I5032 Chronic diastolic (congestive) heart failure: Secondary | ICD-10-CM

## 2024-04-28 DIAGNOSIS — R7881 Bacteremia: Secondary | ICD-10-CM

## 2024-04-28 DIAGNOSIS — B952 Enterococcus as the cause of diseases classified elsewhere: Secondary | ICD-10-CM

## 2024-04-28 DIAGNOSIS — E46 Unspecified protein-calorie malnutrition: Secondary | ICD-10-CM

## 2024-04-28 DIAGNOSIS — J9601 Acute respiratory failure with hypoxia: Secondary | ICD-10-CM

## 2024-04-28 DIAGNOSIS — Z91199 Patient's noncompliance with other medical treatment and regimen due to unspecified reason: Secondary | ICD-10-CM

## 2024-04-28 LAB — BLOOD GAS, VENOUS
Acid-Base Excess: 24.5 mmol/L — ABNORMAL HIGH (ref 0.0–2.0)
Bicarbonate: 52.6 mmol/L — ABNORMAL HIGH (ref 20.0–28.0)
Drawn by: 66579
O2 Saturation: 95.3 %
Patient temperature: 36.4
pCO2, Ven: 67 mmHg — ABNORMAL HIGH (ref 44–60)
pH, Ven: 7.5 — ABNORMAL HIGH (ref 7.25–7.43)
pO2, Ven: 62 mmHg — ABNORMAL HIGH (ref 32–45)

## 2024-04-28 LAB — BLOOD CULTURE ID PANEL (REFLEXED) - BCID2
A.calcoaceticus-baumannii: DETECTED — AB
Bacteroides fragilis: NOT DETECTED
CTX-M ESBL: NOT DETECTED
Candida albicans: NOT DETECTED
Candida auris: NOT DETECTED
Candida glabrata: NOT DETECTED
Candida krusei: NOT DETECTED
Candida parapsilosis: NOT DETECTED
Candida tropicalis: NOT DETECTED
Carbapenem resistance IMP: NOT DETECTED
Carbapenem resistance KPC: NOT DETECTED
Carbapenem resistance NDM: NOT DETECTED
Carbapenem resistance VIM: NOT DETECTED
Cryptococcus neoformans/gattii: NOT DETECTED
Enterobacter cloacae complex: NOT DETECTED
Enterobacterales: NOT DETECTED
Enterococcus Faecium: NOT DETECTED
Enterococcus faecalis: NOT DETECTED
Escherichia coli: NOT DETECTED
Haemophilus influenzae: NOT DETECTED
Klebsiella aerogenes: NOT DETECTED
Klebsiella oxytoca: NOT DETECTED
Klebsiella pneumoniae: NOT DETECTED
Listeria monocytogenes: NOT DETECTED
Methicillin resistance mecA/C: NOT DETECTED
Neisseria meningitidis: NOT DETECTED
Proteus species: NOT DETECTED
Pseudomonas aeruginosa: NOT DETECTED
Salmonella species: NOT DETECTED
Serratia marcescens: NOT DETECTED
Staphylococcus aureus (BCID): NOT DETECTED
Staphylococcus epidermidis: DETECTED — AB
Staphylococcus lugdunensis: NOT DETECTED
Staphylococcus species: DETECTED — AB
Stenotrophomonas maltophilia: NOT DETECTED
Streptococcus agalactiae: NOT DETECTED
Streptococcus pneumoniae: NOT DETECTED
Streptococcus pyogenes: NOT DETECTED
Streptococcus species: NOT DETECTED

## 2024-04-28 LAB — PROTIME-INR
INR: 2.3 — ABNORMAL HIGH (ref 0.8–1.2)
Prothrombin Time: 26.8 s — ABNORMAL HIGH (ref 11.4–15.2)

## 2024-04-28 LAB — BASIC METABOLIC PANEL WITH GFR
Anion gap: 13 (ref 5–15)
Anion gap: 15 (ref 5–15)
BUN: 18 mg/dL (ref 8–23)
BUN: 22 mg/dL (ref 8–23)
CO2: 39 mmol/L — ABNORMAL HIGH (ref 22–32)
CO2: 32 mmol/L (ref 22–32)
Calcium: 8.6 mg/dL — ABNORMAL LOW (ref 8.9–10.3)
Calcium: 8.5 mg/dL — ABNORMAL LOW (ref 8.9–10.3)
Chloride: 91 mmol/L — ABNORMAL LOW (ref 98–111)
Chloride: 93 mmol/L — ABNORMAL LOW (ref 98–111)
Creatinine, Ser: 0.93 mg/dL (ref 0.61–1.24)
Creatinine, Ser: 0.84 mg/dL (ref 0.61–1.24)
GFR, Estimated: >60 mL/min (ref >60)
GFR, Estimated: >60 mL/min (ref >60)
Glucose, Bld: 130 mg/dL — ABNORMAL HIGH (ref 70–99)
Glucose, Bld: 134 mg/dL — ABNORMAL HIGH (ref 70–99)
Potassium: 3.3 mmol/L — ABNORMAL LOW (ref 3.5–5.1)
Potassium: 4 mmol/L (ref 3.5–5.1)
Sodium: 143 mmol/L (ref 135–145)
Sodium: 140 mmol/L (ref 135–145)

## 2024-04-28 LAB — TROPONIN I (HIGH SENSITIVITY): Troponin I (High Sensitivity): 44 ng/L — ABNORMAL HIGH (ref <18)

## 2024-04-28 LAB — ECHOCARDIOGRAM COMPLETE
AR max vel: 4.49 cm2
AV Area VTI: 3.77 cm2
AV Area mean vel: 4.38 cm2
AV Mean grad: 5 mmHg
AV Peak grad: 8.4 mmHg
Ao pk vel: 1.45 m/s
Area-P 1/2: 5.06 cm2
S' Lateral: 4 cm
Weight: 4479.75 [oz_av]

## 2024-04-28 LAB — RESPIRATORY PANEL BY PCR

## 2024-04-28 LAB — BLOOD GAS, ARTERIAL
Acid-Base Excess: 22.6 mmol/L — ABNORMAL HIGH (ref 0.0–2.0)
Bicarbonate: 50.6 mmol/L — ABNORMAL HIGH (ref 20.0–28.0)
Drawn by: 24610
O2 Saturation: 95.8 %
Patient temperature: 36.8
pCO2 arterial: 68 mmHg (ref 32–48)
pH, Arterial: 7.48 — ABNORMAL HIGH (ref 7.35–7.45)
pO2, Arterial: 69 mmHg — ABNORMAL LOW (ref 83–108)

## 2024-04-28 LAB — CBC WITH DIFFERENTIAL/PLATELET
Abs Immature Granulocytes: 0.07 K/uL (ref 0.00–0.07)
Basophils Absolute: 0.1 K/uL (ref 0.0–0.1)
Basophils Relative: 1 %
Eosinophils Absolute: 0 K/uL (ref 0.0–0.5)
Eosinophils Relative: 0 %
HCT: 40.5 % (ref 39.0–52.0)
Hemoglobin: 12.8 g/dL — ABNORMAL LOW (ref 13.0–17.0)
Immature Granulocytes: 1 %
Lymphocytes Relative: 9 %
Lymphs Abs: 0.9 K/uL (ref 0.7–4.0)
MCH: 30.8 pg (ref 26.0–34.0)
MCHC: 31.6 g/dL (ref 30.0–36.0)
MCV: 97.6 fL (ref 80.0–100.0)
Monocytes Absolute: 1.2 K/uL — ABNORMAL HIGH (ref 0.1–1.0)
Monocytes Relative: 11 %
Neutro Abs: 7.9 K/uL — ABNORMAL HIGH (ref 1.7–7.7)
Neutrophils Relative %: 78 %
Platelets: 285 K/uL (ref 150–400)
RBC: 4.15 MIL/uL — ABNORMAL LOW (ref 4.22–5.81)
RDW: 14.7 % (ref 11.5–15.5)
WBC: 10.2 K/uL (ref 4.0–10.5)
nRBC: 0 % (ref 0.0–0.2)

## 2024-04-28 LAB — RESP PANEL BY RT-PCR (RSV, FLU A&B, COVID)  RVPGX2
Influenza A by PCR: NEGATIVE
Influenza B by PCR: NEGATIVE
Resp Syncytial Virus by PCR: NEGATIVE
SARS Coronavirus 2 by RT PCR: NEGATIVE

## 2024-04-28 LAB — MAGNESIUM: Magnesium: 2.3 mg/dL (ref 1.7–2.4)

## 2024-04-28 LAB — ALBUMIN: Albumin: 2.7 g/dL — ABNORMAL LOW (ref 3.5–5.0)

## 2024-04-28 MED ORDER — SODIUM CHLORIDE 0.9 % IV SOLN
3.0000 g | Freq: Four times a day (QID) | INTRAVENOUS | Status: DC
  Administered 2024-04-28 – 2024-04-29 (×5): 3 g via INTRAVENOUS
  Filled 2024-04-28 (×7): qty 8

## 2024-04-28 MED ORDER — WARFARIN SODIUM 2 MG PO TABS
4.0000 mg | ORAL_TABLET | Freq: Once | ORAL | Status: AC
  Administered 2024-04-28: 4 mg via ORAL
  Filled 2024-04-28: qty 2

## 2024-04-28 MED ORDER — PERFLUTREN LIPID MICROSPHERE
1.0000 mL | INTRAVENOUS | Status: AC | PRN
  Administered 2024-04-28: 3 mL via INTRAVENOUS

## 2024-04-28 MED ORDER — POTASSIUM CHLORIDE 20 MEQ PO PACK
40.0000 meq | PACK | Freq: Once | ORAL | Status: AC
  Administered 2024-04-28: 40 meq via ORAL
  Filled 2024-04-28: qty 2

## 2024-04-28 MED ORDER — POTASSIUM CHLORIDE CRYS ER 20 MEQ PO TBCR
40.0000 meq | EXTENDED_RELEASE_TABLET | Freq: Once | ORAL | Status: AC
  Administered 2024-04-28: 40 meq via ORAL
  Filled 2024-04-28: qty 2

## 2024-04-28 MED ORDER — DULOXETINE HCL 30 MG PO CPEP
30.0000 mg | ORAL_CAPSULE | Freq: Every morning | ORAL | Status: DC
  Administered 2024-04-29: 30 mg via ORAL
  Filled 2024-04-28: qty 1

## 2024-04-28 NOTE — Progress Notes (Signed)
 Came to room for morning eval, pt was on bipap.  Per pt, he wanted to trial off bipap.  Pt was placed on [place] 4-6 lpm, after a couple of minutes pt desat to 88-89%, noted increased WOB and tachypnea,  pt requesting to be placed back on bipap.  RN at bedside during this and aware.  PT placed back on bipap, sat now 100%, no distress noted.    0930- Spoke w/ Dr JINNY Foot re: plan for today.  MD aware of above.  Will trial off bipap later today  if pt can tolerate.

## 2024-04-28 NOTE — Progress Notes (Signed)
 Initial Nutrition Assessment  DOCUMENTATION CODES:  Obesity unspecified  INTERVENTION:  Transition to 2 gram sodium diet Encourage adequate balance of nutrition with protein at each meal Discussed with RN assisting in meal set up and opening containers d/t pt with right sided hemiplegia Chopped meats with meals to reduce need for cutting items Low sodium nutrition therapy handout added to AVS  NUTRITION DIAGNOSIS:  Increased nutrient needs related to acute illness as evidenced by estimated needs.  GOAL:  Patient will meet greater than or equal to 90% of their needs  MONITOR:  PO intake, Labs, Weight trends, I & O's  REASON FOR ASSESSMENT:  Consult Assessment of nutrition requirement/status  ASSESSMENT:  Pt admitted from [facility] with c/o AMS. PMH significant for chronic hypercarbic respiratory failure, left sided CVA with right hemiplegia, afib, lupus anticoagulant on warfarin.   Per review of chart, pt reported to be very non-compliant with restricted sodium diet. He drinks lots of soda and eats a lot of food from other residents and from the vending machine.   Spoke with pt at bedside. He is a poor historian though provides limited nutrition history. He states that he is introverted and typically eats meals in his room. He recalls that he eats whatever they provide. Breakfast he generally eats either 2 bowls of cereal or will have grits or oatmeal with eggs, fruit and toast with jam and juice. Lunch and dinner are variable. Pt denies having difficulty chewing/swallowing foods. Pt asking what his goals are. Encouraged adequate oral intake with a variety of food groups including protein. Encourage limited sodium intake to aid in volume overload.   Utilizes electric scooter at baseline. He is dependent with almost all ADL's except feeding himself. He is able to utilize his left hand for self feeding though reports having difficulty opening containers and cutting meet. All else,  he can usually do himself.   Pt uncertain on his weight trends though mentions that his usual weight is about 300 lbs.   Admit weight: 127 kg; same as that from documented weight in July therefore do no suspect this to be an updated, actual weight measurement  Medications: jardiance , lasix  80mg  BID, warfarin, IV abx x2  Labs:  Potassium 3.3 Chloride 91  NUTRITION - FOCUSED PHYSICAL EXAM: Flowsheet Row Most Recent Value  Orbital Region No depletion  Upper Arm Region No depletion  Thoracic and Lumbar Region No depletion  Buccal Region No depletion  Temple Region No depletion  Clavicle Bone Region Mild depletion  [right side > left side]  Clavicle and Acromion Bone Region No depletion  [Right sided atrophy]  Scapular Bone Region No depletion  Dorsal Hand Mild depletion  Patellar Region No depletion  Anterior Thigh Region No depletion  Posterior Calf Region Mild depletion  Edema (RD Assessment) Mild  [RUE]  Hair Reviewed  Eyes Reviewed  Mouth Reviewed  Skin Reviewed  Nails Reviewed    Diet Order:   Diet Order             Diet 2 gram sodium Room service appropriate? Yes with Assist; Fluid consistency: Thin  Diet effective now                   EDUCATION NEEDS:   Education needs have been addressed  Skin:  Skin Assessment: Reviewed RN Assessment  Last BM:  unknown/PTA  Height:   Ht Readings from Last 1 Encounters:  12/10/23 6' 1 (1.854 m)    Weight:   Wt Readings from  Last 1 Encounters:  04/28/24 127 kg    Ideal Body Weight:  83.6 kg  BMI:  Body mass index is 36.94 kg/m.  Estimated Nutritional Needs:   Kcal:  2000-2200  Protein:  110-125g  Fluid:  2L  Allie Darreld Hoffer, RDN, LDN Clinical Nutrition See AMiON for contact information.

## 2024-04-28 NOTE — Progress Notes (Signed)
 Came to room to assess pt.  Pt currently on 5 lpm [place], denies any SOB, no distress noted currently.  Sat 97%

## 2024-04-28 NOTE — Progress Notes (Signed)
 PHARMACY - PHYSICIAN COMMUNICATION CRITICAL VALUE ALERT - BLOOD CULTURE IDENTIFICATION (BCID)  James Villegas is an 68 y.o. male who presented to [facility] on 04/27/2024 with altered mental status  Assessment:  1/4 blood cultures with staph epi (no R) + acinetobacter baumanii complex (same bottle)  Name of physician (or Provider) Contacted: IMTS  Current antibiotics: Ceftriaxone  2g IV daily + azithromycin  500mg  IV daily   Changes to prescribed antibiotics recommended:   -Change ceftriaxone  to Unasyn  3g IV every 6 hours   Results for orders placed or performed during the hospital encounter of 04/27/24  Blood Culture ID Panel (Reflexed) (Collected: 04/27/2024  9:44 AM)  Result Value Ref Range   Enterococcus faecalis NOT DETECTED NOT DETECTED   Enterococcus Faecium NOT DETECTED NOT DETECTED   Listeria monocytogenes NOT DETECTED NOT DETECTED   Staphylococcus species DETECTED (A) NOT DETECTED   Staphylococcus aureus (BCID) NOT DETECTED NOT DETECTED   Staphylococcus epidermidis DETECTED (A) NOT DETECTED   Staphylococcus lugdunensis NOT DETECTED NOT DETECTED   Streptococcus species NOT DETECTED NOT DETECTED   Streptococcus agalactiae NOT DETECTED NOT DETECTED   Streptococcus pneumoniae NOT DETECTED NOT DETECTED   Streptococcus pyogenes NOT DETECTED NOT DETECTED   A.calcoaceticus-baumannii DETECTED (A) NOT DETECTED   Bacteroides fragilis NOT DETECTED NOT DETECTED   Enterobacterales NOT DETECTED NOT DETECTED   Enterobacter cloacae complex NOT DETECTED NOT DETECTED   Escherichia coli NOT DETECTED NOT DETECTED   Klebsiella aerogenes NOT DETECTED NOT DETECTED   Klebsiella oxytoca NOT DETECTED NOT DETECTED   Klebsiella pneumoniae NOT DETECTED NOT DETECTED   Proteus species NOT DETECTED NOT DETECTED   Salmonella species NOT DETECTED NOT DETECTED   Serratia marcescens NOT DETECTED NOT DETECTED   Haemophilus influenzae NOT DETECTED NOT DETECTED   Neisseria meningitidis NOT DETECTED NOT  DETECTED   Pseudomonas aeruginosa NOT DETECTED NOT DETECTED   Stenotrophomonas maltophilia NOT DETECTED NOT DETECTED   Candida albicans NOT DETECTED NOT DETECTED   Candida auris NOT DETECTED NOT DETECTED   Candida glabrata NOT DETECTED NOT DETECTED   Candida krusei NOT DETECTED NOT DETECTED   Candida parapsilosis NOT DETECTED NOT DETECTED   Candida tropicalis NOT DETECTED NOT DETECTED   Cryptococcus neoformans/gattii NOT DETECTED NOT DETECTED   CTX-M ESBL NOT DETECTED NOT DETECTED   Carbapenem resistance IMP NOT DETECTED NOT DETECTED   Carbapenem resistance KPC NOT DETECTED NOT DETECTED   Methicillin resistance mecA/C NOT DETECTED NOT DETECTED   Carbapenem resistance NDM NOT DETECTED NOT DETECTED   Carbapenem resistance VIM NOT DETECTED NOT DETECTED    Lynwood Poplar, PharmD, BCPS Clinical Pharmacist 04/28/2024 6:18 AM

## 2024-04-28 NOTE — Progress Notes (Signed)
 Came to room for bipap check.  Found pt on [place] (off bipap), no distress noted.  ECHO/ultrasound is in room currently.  Per pt, he denies SOB, no distress noted.

## 2024-04-28 NOTE — Progress Notes (Signed)
 PHARMACY - ANTICOAGULATION CONSULT NOTE  Pharmacy Consult for warfarin dosing by pharmacy  Indication: atrial fibrillation  Allergies  Allergen Reactions   Fenofibrate Other (See Comments)    Reaction not listed on [facility]     Patient Measurements: Weight: 127 kg (279 lb 15.8 oz) (Wt from July 2025)  Vital Signs: Temp: 98.3 F (36.8 C) (11/20 1138) Temp Source: Oral (11/20 1138) BP: 146/72 (11/20 1138) Pulse Rate: 100 (11/20 1233)  Labs: Recent Labs    04/27/24 0907 04/27/24 0914 04/27/24 0917 04/27/24 1036 04/27/24 1352 04/27/24 1821 04/27/24 2057 04/27/24 2301 04/28/24 0106 04/28/24 1115  HGB 14.1 15.0   < > 13.6 12.6*  --   --   --  12.8*  --   HCT 45.1 44.0   < > 40.0 37.0*  --   --   --  40.5  --   PLT 305  --   --   --   --   --   --   --  285  --   LABPROT 25.2*  --   --   --   --   --   --   --  26.8*  --   INR 2.2*  --   --   --   --   --   --   --  2.3*  --   CREATININE 0.75 0.80  --   --   --   --   --   --  0.93 0.84  TROPONINIHS 33*  --   --   --   --    < > 62* 49* 44*  --    < > = values in this interval not displayed.    Estimated Creatinine Clearance: 122.4 mL/min (by C-G formula based on SCr of 0.84 mg/dL).   Medical History: Past Medical History:  Diagnosis Date   Acute on chronic congestive heart failure (HCC)    Anemia, secondary    SECONDARY TO ACUTE BLOOD LOSS   Anxiety disorder    Bloody stool 08/29/2011   intermittent along with constipation.    CVA (cerebral vascular accident) (HCC) 2010   large left MCA stroke with right hemiparesis   Depression    DVT of lower extremity (deep venous thrombosis) (HCC)    RIGHT LOWER; s/p IVC filter 7/12   Dysphagia    Hyperlipidemia    Hypertension    Lupus anticoagulant disorder 1990   Morbid obesity (HCC)    PFO (patent foramen ovale)    TEE 2/10: EF 60%, trivial AI, mild Ao root dilatation, mod PFO with R-L shunting, atrial septal aneurysm;   echo 7/12: EF 65-70%, grade 1 diast dysfxn, mild  MR, LVOT showed severe obstruction   Protein C deficiency    Protein S deficiency    Rectus sheath hematoma 7/12   required reversal of anticoagulation and c/b DVT req. IVC filter   Seizure disorder (HCC)     Medications:  Scheduled:   [START ON 04/29/2024] DULoxetine   30 mg Oral q AM   empagliflozin   10 mg Oral Daily   fluticasone  furoate-vilanterol  1 puff Inhalation Daily   furosemide   80 mg Intravenous BID   phenytoin   200 mg Oral BID   rosuvastatin   40 mg Oral QHS   warfarin  4 mg Oral ONCE-1600   Warfarin - Pharmacist Dosing Inpatient   Does not apply q1600    Assessment: INR 2.3 within target range (2.0 - 3.0).   Goal of  Therapy:  INR 2-3    Plan:  Will recommend repeat 4 mg dose of warfarin today at 1060 hr . Will evaluate dose/response with tomorrow mornings INR dose accordingly.   Shirlee GORMAN Langdon, Pharmacy Student 04/28/2024,1:30 PM

## 2024-04-28 NOTE — Progress Notes (Signed)
 HD#1 SUBJECTIVE:  Patient Summary: James Villegas is a 68 y.o. with a pertinent PMH of Left CVA with R hemiplegia, chronic hypercarbic respiratory failure, a-fib, and lupus anticoagulant, who presented with encephalopathy and admitted for [facility].   Overnight Events: No acute events overnight  Interim History: Patient was seen and evaluated at the bedside this morning.  He is significantly more alert and feeling better overall.  He is denying chest pain today and reports that he is still feeling breath but better.  He would like to take the BiPAP off and drink.  OBJECTIVE:  Vital Signs: Vitals:   04/28/24 0840 04/28/24 1052 04/28/24 1138 04/28/24 1233  BP:   (!) 146/72   Pulse:  (!) 104 88 100  Resp:  (!) 22 20 19   Temp:   98.3 F (36.8 C)   TempSrc:   Oral   SpO2: 100% 93% 95% 94%  Weight:       Supplemental O2: BiPAP SpO2: 94 % O2 Flow Rate (L/min): 5 L/min FiO2 (%): 50 %  Filed Weights   04/28/24 0600  Weight: 127 kg     Intake/Output Summary (Last 24 hours) at 04/28/2024 1357 Last data filed at 04/28/2024 1139 Gross per 24 hour  Intake 1941.57 ml  Output 1120 ml  Net 821.57 ml   Net IO Since Admission: 821.57 mL [04/28/24 1357]  Physical Exam: Const: Awake, alert in NAD HENT: Normocephalic, atraumatic, mucus membranes moist Card: atrial fibrillation, No MRG, pitting edema in RLE (chronic) Resp: Diminished breath sounds bilaterally.  Abd: Soft, NTND, Bsx4 Extremities: Warm, pink, dependent edema noted in the RLE. Mild discoloration in bilateral distal lower extremities.  Neuro: 0/5 strength in RLE, severely weakened muscle strength in RUE   Patient Lines/Drains/Airways Status     Active Line/Drains/Airways     Name Placement date Placement time Site Days   Peripheral IV 04/27/24 18 G Left Antecubital 04/27/24  0907  Antecubital  1   Peripheral IV 04/27/24 18 G Left;Posterior Hand 04/27/24  0907  Hand  1   External Urinary Catheter 04/27/24  1600  --  1             Pertinent labs and imaging:     Latest Ref Rng & Units 04/28/2024    1:06 AM 04/27/2024    1:52 PM 04/27/2024   10:36 AM  CBC  WBC 4.0 - 10.5 K/uL 10.2     Hemoglobin 13.0 - 17.0 g/dL 87.1  87.3  86.3   Hematocrit 39.0 - 52.0 % 40.5  37.0  40.0   Platelets 150 - 400 K/uL 285          Latest Ref Rng & Units 04/28/2024   11:15 AM 04/28/2024    1:06 AM 04/27/2024    1:52 PM  CMP  Glucose 70 - 99 mg/dL 865  869    BUN 8 - 23 mg/dL 22  18    Creatinine 9.38 - 1.24 mg/dL 9.15  9.06    Sodium 864 - 145 mmol/L 140  143  135   Potassium 3.5 - 5.1 mmol/L 4.0  3.3  3.9   Chloride 98 - 111 mmol/L 93  91    CO2 22 - 32 mmol/L 32  39    Calcium  8.9 - 10.3 mg/dL 8.5  8.6      No results found.  ASSESSMENT/PLAN:  Assessment: Principal Problem:   Acute respiratory failure with hypoxia and hypercapnia (HCC) Active Problems:   Chronic heart failure  with preserved ejection fraction (HFpEF) (HCC)   Plan: # Acute on chronic hypoxic hypercarbic respiratory failure # BiPAP non-adherence - The patient was admitted on 1119 with encephalopathy secondary to hypercarbia. -The patient is historically noncompliant with the BiPAP however as he has not been admitted since July, unsure with the setting of this exacerbation is in. -Respiratory viral panel including COVID pending -Chest x-ray yesterday showed pneumonia in the left lung however this has been noted on multiple previous x-rays dating months back.  As the patient does not have a cough, leukocytosis or fever, low suspicion at this time for acute bacterial pneumonia. - The patient did have trial off BiPAP this morning and became hypoxic with increased work of breathing after approximately 20 minutes.  However later in the morning he did self remove the BiPAP and has been stable at home dose of 4 L nasal cannula.  He remains without encephalopathy. -ABG off BiPAP with improving pH and CO2 68.  Suspect this is around his  baseline. - Will continue to home O2 requirement limits and BiPAP while sleeping. - Will continue to monitor respiratory status very closely.  # Bacteremia - Blood cultures staphylococcus epidermis, A. Calcoaceticus baumannii - Unsure if this is true bacteremia or contaminant.  Susceptibilities pending tomorrow.  The patient does not have fever or leukocytosis which is reassuring. - TTE pending - Continue azithromycin  and Unasyn until susceptibilities and narrow from there.  #CHF -Patient currently without exacerbation.  He does have significant right lower extremity edema however that he does have chronic edema due to hemiparesis from prior CVA. -Will continue a Lasix  twice daily for goal of euvolemia.  # Malnutrition Chronically malnourished.  Albumin is 2.7 today. RD consult placed for nutritional supplementation and evaluation.  #Depression Will restart home duloxetine  today.  #A-fib with RVR Rate currently well-controlled Continue warfarin appreciate pharmacy's assistance with dosing.  # Goals of care The patient would benefit from goals of care conversation.  There has been DNR signed in the past that has been revoked.  However with frequent hospitalizations and patient's increasing oxygen requirements over time, he would benefit from continued goals of care conversations  Best Practice: Diet: Regular diet IVF: Fluids: none, Rate: None VTE:  Code: Full  Disposition planning: Therapy Recs: Pending, DME: wheelchair Family Contact: susan Mericle, called and notified. DISPO: Anticipated discharge pending to assisted living pending Clinical improvement.  Signature:  Schuyler Novak, DO [facility]  1:57 PM, 04/28/2024  On Call pager [phone]

## 2024-04-29 DIAGNOSIS — Z7901 Long term (current) use of anticoagulants: Secondary | ICD-10-CM

## 2024-04-29 DIAGNOSIS — J9602 Acute respiratory failure with hypercapnia: Secondary | ICD-10-CM | POA: Diagnosis not present

## 2024-04-29 DIAGNOSIS — Z9981 Dependence on supplemental oxygen: Secondary | ICD-10-CM

## 2024-04-29 DIAGNOSIS — B957 Other staphylococcus as the cause of diseases classified elsewhere: Secondary | ICD-10-CM

## 2024-04-29 DIAGNOSIS — D6861 Antiphospholipid syndrome: Secondary | ICD-10-CM

## 2024-04-29 DIAGNOSIS — Z79899 Other long term (current) drug therapy: Secondary | ICD-10-CM

## 2024-04-29 DIAGNOSIS — J9601 Acute respiratory failure with hypoxia: Secondary | ICD-10-CM | POA: Diagnosis not present

## 2024-04-29 DIAGNOSIS — I69351 Hemiplegia and hemiparesis following cerebral infarction affecting right dominant side: Secondary | ICD-10-CM

## 2024-04-29 DIAGNOSIS — Z792 Long term (current) use of antibiotics: Secondary | ICD-10-CM

## 2024-04-29 LAB — CBC WITH DIFFERENTIAL/PLATELET
Abs Immature Granulocytes: 0.07 K/uL (ref 0.00–0.07)
Basophils Absolute: 0 K/uL (ref 0.0–0.1)
Basophils Relative: 0 %
Eosinophils Absolute: 0.3 K/uL (ref 0.0–0.5)
Eosinophils Relative: 3 %
HCT: 42.9 % (ref 39.0–52.0)
Hemoglobin: 13.5 g/dL (ref 13.0–17.0)
Immature Granulocytes: 1 %
Lymphocytes Relative: 4 %
Lymphs Abs: 0.4 K/uL — ABNORMAL LOW (ref 0.7–4.0)
MCH: 31 pg (ref 26.0–34.0)
MCHC: 31.5 g/dL (ref 30.0–36.0)
MCV: 98.4 fL (ref 80.0–100.0)
Monocytes Absolute: 1 K/uL (ref 0.1–1.0)
Monocytes Relative: 8 %
Neutro Abs: 9.7 K/uL — ABNORMAL HIGH (ref 1.7–7.7)
Neutrophils Relative %: 84 %
Platelets: 223 K/uL (ref 150–400)
RBC: 4.36 MIL/uL (ref 4.22–5.81)
RDW: 14.9 % (ref 11.5–15.5)
WBC: 11.4 K/uL — ABNORMAL HIGH (ref 4.0–10.5)
nRBC: 0 % (ref 0.0–0.2)

## 2024-04-29 LAB — BASIC METABOLIC PANEL WITH GFR
Anion gap: 19 — ABNORMAL HIGH (ref 5–15)
BUN: 16 mg/dL (ref 8–23)
CO2: 30 mmol/L (ref 22–32)
Calcium: 8.1 mg/dL — ABNORMAL LOW (ref 8.9–10.3)
Chloride: 93 mmol/L — ABNORMAL LOW (ref 98–111)
Creatinine, Ser: 0.71 mg/dL (ref 0.61–1.24)
GFR, Estimated: >60 mL/min (ref >60)
Glucose, Bld: 92 mg/dL (ref 70–99)
Potassium: 3.8 mmol/L (ref 3.5–5.1)
Sodium: 142 mmol/L (ref 135–145)

## 2024-04-29 LAB — CULTURE, BLOOD (ROUTINE X 2)

## 2024-04-29 LAB — MRSA NEXT GEN BY PCR, NASAL: MRSA by PCR Next Gen: DETECTED — AB

## 2024-04-29 LAB — PROTIME-INR
INR: 1.8 — ABNORMAL HIGH (ref 0.8–1.2)
Prothrombin Time: 22 s — ABNORMAL HIGH (ref 11.4–15.2)

## 2024-04-29 MED ORDER — ENOXAPARIN SODIUM 80 MG/0.8ML IJ SOSY
65.0000 mg | PREFILLED_SYRINGE | Freq: Once | INTRAMUSCULAR | Status: AC
  Administered 2024-04-29: 65 mg via SUBCUTANEOUS
  Filled 2024-04-29: qty 0.8

## 2024-04-29 MED ORDER — TORSEMIDE 20 MG PO TABS
40.0000 mg | ORAL_TABLET | Freq: Two times a day (BID) | ORAL | Status: DC
  Administered 2024-04-29: 40 mg via ORAL
  Filled 2024-04-29: qty 2

## 2024-04-29 MED ORDER — AMOXICILLIN-POT CLAVULANATE 875-125 MG PO TABS: 1.0000 | ORAL_TABLET | Freq: Two times a day (BID) | ORAL | Status: AC

## 2024-04-29 MED ORDER — CHLORHEXIDINE GLUCONATE CLOTH 2 % EX PADS
6.0000 | MEDICATED_PAD | Freq: Every day | CUTANEOUS | Status: DC
  Administered 2024-04-29: 6 via TOPICAL

## 2024-04-29 MED ORDER — ENOXAPARIN SODIUM 150 MG/ML IJ SOSY
1.0000 mg/kg | PREFILLED_SYRINGE | Freq: Two times a day (BID) | INTRAMUSCULAR | Status: DC
  Administered 2024-04-29: 132 mg via SUBCUTANEOUS
  Filled 2024-04-29 (×2): qty 0.88

## 2024-04-29 MED ORDER — MUPIROCIN 2 % EX OINT
1.0000 | TOPICAL_OINTMENT | Freq: Two times a day (BID) | CUTANEOUS | Status: DC
  Administered 2024-04-29: 1 via NASAL
  Filled 2024-04-29: qty 22

## 2024-04-29 MED ORDER — TORSEMIDE 40 MG PO TABS: 40.0000 mg | ORAL_TABLET | Freq: Two times a day (BID) | ORAL | Status: DC

## 2024-04-29 MED ORDER — WARFARIN SODIUM 5 MG PO TABS
7.5000 mg | ORAL_TABLET | Freq: Once | ORAL | Status: AC
  Administered 2024-04-29: 7.5 mg via ORAL
  Filled 2024-04-29: qty 1

## 2024-04-29 NOTE — Progress Notes (Signed)
  Progress Note   Date: 04/29/2024  Patient Name: James Villegas        MRN#: [id number]   Clarification of diagnosis:   Obesity class II

## 2024-04-29 NOTE — Discharge Instructions (Addendum)
 Thank you for allowing us  to be part of your care. You were hospitalized for respiratory failure and a blood stream infection. We treated you with BiPAP and antibiotics.  See the changes in your medications and management of your chronic conditions below:  *For your lungs -I believe you ended up in the hospital due to not wearing your BiPAP at night. Please make sure to wear your BiPAP any time you are sleeping. You should also wear it when you are feeling like it is difficult to breathe.    *For your Blood Stream Infection -We have STARTED you on these following medications:  -Augmentin  (an antibiotic)--Take 1 tablet every 12 hours for 1 week.    *For your seizures -We have CHANGED the following medications:  -Phenytoin  (Dilantin )-- take 200mg  every morning and night.    -We have decreased your dose by 50mg  due to your level being high when you arrived at the hospital. Please recheck the level and increase dose back to 450mg  daily if indicated.   -Please see you PCP in 7 to 10 days  Please make sure to wear your BiPAP and take your antibiotics as prescribed!  Please call your PCP or our clinic if you have any questions or concerns, we may be able to help and keep you from a long and expensive emergency room wait. Our clinic and after hours phone number is [phone]. The best time to call is Monday through Friday 9 am to 4 pm but there is always someone available 24/7 if you have an emergency. If you need medication refills please notify your pharmacy one week in advance and they will send us  a request.   We are glad you are feeling better,  Schuyler Novak, DO Internal Medicine Inpatient Teaching Service at [facility]

## 2024-04-29 NOTE — Progress Notes (Signed)
 PHARMACY - ANTICOAGULATION CONSULT NOTE  Pharmacy Consult for warfarin dosing per Pharmacy Indication: atrial fibrillation  Allergies  Allergen Reactions   Fenofibrate Other (See Comments)    Reaction not listed on [facility]     Patient Measurements: Weight: 130.6 kg (287 lb 14.7 oz) (removed all blankets)  Vital Signs: Temp: 98.1 F (36.7 C) (11/21 0400) Temp Source: Oral (11/21 0400) BP: 111/58 (11/21 0400) Pulse Rate: 97 (11/21 0400)  Labs: Recent Labs    04/27/24 0907 04/27/24 0914 04/27/24 1352 04/27/24 1821 04/27/24 2057 04/27/24 2301 04/28/24 0106 04/28/24 1115 04/29/24 0317  HGB 14.1   < > 12.6*  --   --   --  12.8*  --  13.5  HCT 45.1   < > 37.0*  --   --   --  40.5  --  42.9  PLT 305  --   --   --   --   --  285  --  223  LABPROT 25.2*  --   --   --   --   --  26.8*  --  22.0*  INR 2.2*  --   --   --   --   --  2.3*  --  1.8*  CREATININE 0.75   < >  --   --   --   --  0.93 0.84 0.71  TROPONINIHS 33*  --   --    < > 62* 49* 44*  --   --    < > = values in this interval not displayed.    Estimated Creatinine Clearance: 130.5 mL/min (by C-G formula based on SCr of 0.71 mg/dL).   Medical History: Past Medical History:  Diagnosis Date   Acute on chronic congestive heart failure (HCC)    Anemia, secondary    SECONDARY TO ACUTE BLOOD LOSS   Anxiety disorder    Bloody stool 08/29/2011   intermittent along with constipation.    CVA (cerebral vascular accident) (HCC) 2010   large left MCA stroke with right hemiparesis   Depression    DVT of lower extremity (deep venous thrombosis) (HCC)    RIGHT LOWER; s/p IVC filter 7/12   Dysphagia    Hyperlipidemia    Hypertension    Lupus anticoagulant disorder 1990   Morbid obesity (HCC)    PFO (patent foramen ovale)    TEE 2/10: EF 60%, trivial AI, mild Ao root dilatation, mod PFO with R-L shunting, atrial septal aneurysm;   echo 7/12: EF 65-70%, grade 1 diast dysfxn, mild MR, LVOT showed severe obstruction   Protein  C deficiency    Protein S deficiency    Rectus sheath hematoma 7/12   required reversal of anticoagulation and c/b DVT req. IVC filter   Seizure disorder (HCC)     Medications:  Scheduled:   Chlorhexidine  Gluconate Cloth  6 each Topical Daily   DULoxetine   30 mg Oral q AM   empagliflozin   10 mg Oral Daily   fluticasone  furoate-vilanterol  1 puff Inhalation Daily   furosemide   80 mg Intravenous BID   mupirocin  ointment  1 Application Nasal BID   phenytoin   200 mg Oral BID   rosuvastatin   40 mg Oral QHS   Warfarin - Pharmacist Dosing Inpatient   Does not apply q1600    Assessment: INR 1.8  Goal of Therapy:  INR 2-3    Plan:  Warfarin 7.5 mg PO x dose today. Follow-up INR tomorrow for dose/response and dose adjustment as  necessary.   Lynwood KATHEE Lites, PharmD, CPP Clinical Pharmacist Practitioner 04/29/2024,7:46 AM

## 2024-04-29 NOTE — Progress Notes (Signed)
 OT Cancellation Note  Patient Details Name: James Villegas MRN: [id number] DOB: April 11, 1956   Cancelled Treatment:    Reason Eval/Treat Not Completed: OT screened, no needs identified, will sign off. Per pt, uses hoyer lift to power w/c and has not been receiving therapy at facility. Pt total assist for ADLs, but is still able to self-feed and groom. Since pt is at baseline will sign off on this pt.    Max Romano C, OT  Acute Rehabilitation Services Office [phone] Secure chat preferred   Adrianne GORMAN Savers 04/29/2024, 9:39 AM

## 2024-04-29 NOTE — TOC Transition Note (Signed)
 Transition of Care [facility]) - Discharge Note   Patient Details  Name: James Villegas MRN: [id number] Date of Birth: 13-Sep-1955  Transition of [facility]) CM/SW Contact:  Whitfield Campanile, Student-Social Work Phone Number: 04/29/2024, 2:28 PM   Clinical Narrative:     Patient will Discharge to: [facility]  Discharge Date: 04/29/2024  Family Notified: Vollie, Brunty (Sister) [phone] (Mobile)   Transport By: [facility]     Per MD patient is ready for discharge. RN, patient, and facility notified of discharge. Discharge Summary sent to facility. RN given number for report [phone]. Ambulance transport requested for patient.    Social Work Administrator, arts off.  Whitfield Campanile, MSW Intern   Final next level of care: Skilled Nursing Facility Barriers to Discharge: No Barriers Identified   Patient Goals and CMS Choice            Discharge Placement PASRR number recieved: 04/29/24 Existing PASRR number confirmed : 04/29/24          Patient [facility] bed at: [facility] Patient to be transferred to facility by: [facility] Name of family member notified: Osmani, Kersten (Sister)  [phone] [name] Patient and family notified of of transfer: 04/29/24  Discharge Plan and Services Additional resources added to the After Visit Summary for                                       Social Drivers of Health (SDOH) Interventions SDOH Screenings   Food Insecurity: Patient Unable To Answer (12/20/2023)  Housing: Patient Unable To Answer (12/20/2023)  Transportation Needs: Patient Unable To Answer (12/20/2023)  Utilities: Patient Unable To Answer (12/20/2023)  Social Connections: Unknown (12/20/2023)  Tobacco Use: Medium Risk (04/27/2024)     Readmission Risk Interventions    08/26/2022   11:56 AM  Readmission Risk Prevention Plan  Transportation Screening Complete  PCP or Specialist Appt within 5-7 Days Complete  Home Care Screening Complete  Medication Review (RN CM) Referral  to Pharmacy

## 2024-04-29 NOTE — Progress Notes (Signed)
 AVS in packet for transport.

## 2024-04-29 NOTE — Progress Notes (Addendum)
 PHARMACY - ANTICOAGULATION CONSULT NOTE  Pharmacy Consult for warfarin dosing by pharmacy Indication: atrial fibrillation  Allergies  Allergen Reactions   Fenofibrate Other (See Comments)    Reaction not listed on [facility]     Patient Measurements: Weight: 130.6 kg (287 lb 14.7 oz) (removed all blankets)  Vital Signs: Temp: 98.8 F (37.1 C) (11/21 1150) Temp Source: Oral (11/21 1150) BP: 138/72 (11/21 1150) Pulse Rate: 84 (11/21 1150)  Labs: Recent Labs    04/27/24 0907 04/27/24 0914 04/27/24 1352 04/27/24 1821 04/27/24 2057 04/27/24 2301 04/28/24 0106 04/28/24 1115 04/29/24 0317  HGB 14.1   < > 12.6*  --   --   --  12.8*  --  13.5  HCT 45.1   < > 37.0*  --   --   --  40.5  --  42.9  PLT 305  --   --   --   --   --  285  --  223  LABPROT 25.2*  --   --   --   --   --  26.8*  --  22.0*  INR 2.2*  --   --   --   --   --  2.3*  --  1.8*  CREATININE 0.75   < >  --   --   --   --  0.93 0.84 0.71  TROPONINIHS 33*  --   --    < > 62* 49* 44*  --   --    < > = values in this interval not displayed.    Estimated Creatinine Clearance: 130.5 mL/min (by C-G formula based on SCr of 0.71 mg/dL).   Medical History: Past Medical History:  Diagnosis Date   Acute on chronic congestive heart failure (HCC)    Anemia, secondary    SECONDARY TO ACUTE BLOOD LOSS   Anxiety disorder    Bloody stool 08/29/2011   intermittent along with constipation.    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 IVC filter   Seizure disorder (HCC)     Medications:  Scheduled:   Chlorhexidine  Gluconate Cloth  6 each Topical Daily   DULoxetine   30 mg Oral q AM   empagliflozin   10 mg Oral Daily   enoxaparin  (LOVENOX ) injection  1 mg/kg Subcutaneous Q12H   fluticasone  furoate-vilanterol  1 puff Inhalation Daily   mupirocin  ointment  1 Application Nasal BID   phenytoin   200 mg Oral BID   rosuvastatin   40 mg Oral QHS   torsemide   40 mg Oral BID   Warfarin - Pharmacist Dosing Inpatient   Does not apply q1600    Assessment: INR 1.8 not within range (2.0 - 3.0)  Goal of Therapy:  INR 2.0 - 3.0  Plan:  Will recommend a single dose of 7.5 mg dose of warfarin today @1600 . Will evaluate dose/response with tomorrow mornings INR dose accordingly.   Tou S Vang 04/29/2024,12:14 PM  I have discussed with the student and the IMTS Team regarding interpretation of today's INR and based upon our protocol for day 3 with score of 6 points, with INR of 1.8, dosing between 2.5 UP TO 7.5 mg warfarin is indicated. Additionally, the patient had received a dose of enoxaparin  1 mg/kg this morning at ~1019. FDA approved labeling/Manufacturer's Labeling as well as a 1A recommendation in the ACCP(CHEST) Guidelines advocate for 1.5 mg per kg ONCE DAILY. 1.5 mg per kg once daily would be ~195 mg, SQ. Since the patient has already had administered 130 mg, we will give an additional 65 mg SQ x one dose before discharge to bring his dose up to 195 mg enoxaparin  having been administered. The 7.5 mg dose of warfarin will be administered before discharge as well.   Lynwood Lites, PharmD, CPP Clinical Pharmacist Practitioner

## 2024-04-29 NOTE — Discharge Summary (Signed)
 Name: James Villegas MRN: [id number] DOB: [birth year] 68 y.o. PCP: Feliciano Devoria LABOR, MD  Date of Admission: 04/27/2024  9:02 AM Date of Discharge: 04/29/2024 Attending Physician: Dr. Mliss Foot  Discharge Diagnosis: 1. Principal Problem:   Acute respiratory failure with hypoxia and hypercapnia (HCC) Active Problems:   Chronic heart failure with preserved ejection fraction (HFpEF) (HCC)   Discharge Medications: Allergies as of 04/29/2024       Reactions   Fenofibrate Other (See Comments)   Reaction not listed on MAR         Medication List     PAUSE taking these medications    phenytoin  50 MG tablet Wait to take this until your doctor or other care provider tells you to start again. Commonly known as: DILANTIN  Chew 1 tablet (50 mg total) by mouth at bedtime.   traZODone  50 MG tablet Wait to take this until your doctor or other care provider tells you to start again. Commonly known as: DESYREL  Take 50 mg by mouth at bedtime.       TAKE these medications    acetaminophen  325 MG tablet Commonly known as: TYLENOL  Take 650 mg by mouth every 6 (six) hours as needed for mild pain, headache or fever.   amoxicillin -clavulanate 875-125 MG tablet Commonly known as: AUGMENTIN  Take 1 tablet by mouth 2 (two) times daily for 7 days.   antiseptic oral rinse Liqd 1 Application by Mouth Rinse route at bedtime.   ARTIFICIAL TEAR OP Place 1 drop into both eyes in the morning, at noon, in the evening, and at bedtime.   Baclofen  5 MG Tabs Take 5 mg by mouth at bedtime.   BIOTENE DRY MOUTH GENTLE DT Place 1 Application onto teeth at bedtime.   bisacodyl  5 MG EC tablet Commonly known as: DULCOLAX Take 10 mg by mouth daily as needed for moderate constipation.   cycloSPORINE  0.05 % ophthalmic emulsion Commonly known as: RESTASIS  Place 1 drop into both eyes 2 (two) times daily.   DULoxetine  30 MG capsule Commonly known as: CYMBALTA  Take 30 mg by mouth in the  morning.   ezetimibe  10 MG tablet Commonly known as: ZETIA  Take 10 mg by mouth daily.   fluticasone -salmeterol 250-50 MCG/ACT Aepb Commonly known as: ADVAIR Inhale 1 puff into the lungs in the morning and at bedtime.   guaiFENesin  100 MG/5ML liquid Commonly known as: ROBITUSSIN Take 15 mLs by mouth every 4 (four) hours as needed for cough.   ipratropium-albuterol  0.5-2.5 (3) MG/3ML Soln Commonly known as: DUONEB Take 3 mLs by nebulization every 4 (four) hours as needed.   Jardiance  10 MG Tabs tablet Generic drug: empagliflozin  Take 10 mg by mouth daily.   nystatin  powder Commonly known as: MYCOSTATIN /NYSTOP  Apply topically 3 (three) times daily. What changed:  how much to take additional instructions   omeprazole  20 MG capsule Commonly known as: PRILOSEC Take 20 mg by mouth every other day.   OXYGEN Inhale 4 L into the lungs continuous.   phenytoin  100 MG ER capsule Commonly known as: DILANTIN  Take 200 mg by mouth 2 (two) times daily.   Refresh Optive Advanced 0.5-1-0.5 % Soln Generic drug: Carboxymeth-Glycerin -Polysorb Place 1 drop into both eyes in the morning, at noon, in the evening, and at bedtime.   rosuvastatin  40 MG tablet Commonly known as: CRESTOR  Take 40 mg by mouth at bedtime.   senna-docusate 8.6-50 MG tablet Commonly known as: Senokot-S Take 1 tablet by mouth at bedtime as needed for mild constipation. What  changed: when to take this   Torsemide  40 MG Tabs Take 40 mg by mouth in the morning and at bedtime.   warfarin 4 MG tablet Commonly known as: COUMADIN  Take as directed. If you are unsure how to take this medication, talk to your nurse or doctor. Original instructions: Take 4 mg by mouth every evening.   ZINC OXIDE EX Apply 1 Application topically in the morning and at bedtime. Apply to buttocks topically every day shift and night shift        Disposition and follow-up:   [name] was discharged from [facility] in Stable condition.  At the hospital follow up visit please address:  Respiratory status--Ensure patient is wearing BiPAP nightly and when sleeping. Monitor mental status and respiratory drive.  Phenytoin  level--dilantin  decreased from 450mg  to 400mg  total daily due to elevated level while inpatient. Recheck and adjust dose if necessary.  Bacteremia--Pt positive for Staph Epi and A. Calcoaceticus baumannii. TTE without vegetations. Ensure pt completes 7 days of augmentin .   2.  Labs / imaging needed at time of follow-up: Repeat Blood Cultures, Phenytoin  Level   Hospital Course by problem list: James Villegas is a 68 y.o. person living with a history of chronic hypercarbic respiratory failure on 4 L of oxygen, left-sided CVA with right and plegia, A-fib, lupus anticoagulant who presented with encephalopathy and admitted for acute on chronic hypoxic hypercarbic respiratory failure now being discharged on hospital day 2 with the following pertinent hospital course:  Acute on chronic hypoxic hypercarbic respiratory failure The patient was brought to the ER on 11/19 from his assisted living facility and noted to be severely encephalopathic.  An ABG was obtained which showed a pH was 7.29 with pCO2 of 95.9.  He was placed on BiPAP and given 80 mg of IV Lasix  with slow improvement of his respiratory status.  Due to chest x-ray that showed questionable left lower lobe consolidation he was initially placed on antibiotics for community-acquired pneumonia.  Later that afternoon he was mentating better and communicating clearly and was taken off BiPAP.  However a couple hours later he did begin to experience increased work of breathing where he was placed back on BiPAP for the rest of the night.  At this time he was also complaining of chest pain however EKG and troponins were unremarkable and there was no concern for ACS.  On 11/20 he was doing better and was able to come off BiPAP around 10:30 AM.  An  ABG was obtained off BiPAP which showed the patient's pH 7.4 with pCO2 68.  As the left lower lobe consolidation was chronic per review of previous chest x-rays antibiotics were discontinued.  Suspect he is chronically hypercarbic in the 60s.  Patient was mentating fine without increased work of breathing.  Lasix  IV 80 mg twice daily were continued to ensure fluid overload was not contributing to respiratory status.  On 11/21 he was evaluated in the patient with conversing appropriately and breathing on home oxygen of 4 to 6 L.  He is diuretics were transition to home dose. At this time we did have a long discussion of goals of care and need to wear BiPAP at home consistently.  Palliative care was offered for continued discussion for goals of care however the patient declined.  At this time he was deemed medically stable for discharge.  Bacteremia At the time of presentation to the ER blood cultures were drawn.  He was positive for  Staph epidermidis and A. Calcoaceticus baumannii.  He was placed on azithromycin  and Unasyn  for coverage.  As he remained afebrile and without leukocytosis or systemic signs of infection, unsure whether this is true bacteremia or contaminant however he was treated while inpatient.  A TTE was performed without vegetations.  On 11/21 he was deemed medically ready for discharge with transition to Augmentin  for 7-day course.  Susceptibilities are still pending and if determined to be not susceptible to Augmentin  will change antibiotic.  Stable chronic medical conditions: Seizure disorder Left CVA with right hemiplegia A-fib with RVR  Subjective Patient was seen and examined at the bedside this morning and was feeling well overall.  He states that he still is having difficulty breathing however he reports that this is chronic and that he feels much better when he is able to sit up in his wheelchair.  We did have a long discussion about goals of care and the need to consistently wear  BiPAP if he wanted to persistently remain out of the hospital.  He did verbalize understanding.  We did offer palliative care to continue conversations however the patient declined at this time.  He is willing and looking forward to going back to his facility.  Discharge Exam:   BP 138/72 (BP Location: Right Arm)   Pulse 84   Temp 98.8 F (37.1 C) (Oral)   Resp 20   Wt 130.6 kg Comment: removed all blankets  SpO2 94%   BMI 37.99 kg/m  Discharge exam:  Const: Awake, alert in NAD HENT: Normocephalic, atraumatic, mucus membranes moist Card: RRR, No MRG, chronic nonpitting edema in the right lower extremity Resp: LCTAB, no increased work of breathing however shallow breaths with Abd: Soft, NTND, Bsx4 Extremities: Warm, pink, right lower extremity with chronic nonpitting edema.  Bilateral calves tender to palpation however patient states this is chronic. Neuro: Right hemiparesis.   Pertinent Labs, Studies, and Procedures:     Latest Ref Rng & Units 04/29/2024    3:17 AM 04/28/2024    1:06 AM 04/27/2024    1:52 PM  CBC  WBC 4.0 - 10.5 K/uL 11.4  10.2    Hemoglobin 13.0 - 17.0 g/dL 86.4  87.1  87.3   Hematocrit 39.0 - 52.0 % 42.9  40.5  37.0   Platelets 150 - 400 K/uL 223  285         Latest Ref Rng & Units 04/29/2024    3:17 AM 04/28/2024   11:15 AM 04/28/2024    1:06 AM  CMP  Glucose 70 - 99 mg/dL 92  865  869   BUN 8 - 23 mg/dL 16  22  18    Creatinine 0.61 - 1.24 mg/dL 9.28  9.15  9.06   Sodium 135 - 145 mmol/L 142  140  143   Potassium 3.5 - 5.1 mmol/L 3.8  4.0  3.3   Chloride 98 - 111 mmol/L 93  93  91   CO2 22 - 32 mmol/L 30  32  39   Calcium  8.9 - 10.3 mg/dL 8.1  8.5  8.6     ECHOCARDIOGRAM COMPLETE Result Date: 04/28/2024    ECHOCARDIOGRAM REPORT   Patient Name:   James Villegas Date of Exam: 04/28/2024 Medical Rec #:  [id number]      Height:       73.0 in Accession #:    [id number]     Weight:       280.0 lb Date of Birth:  Nov 24, 1955  BSA:          2.482 m  Patient Age:    67 years       BP:           139/70 mmHg Patient Gender: M              HR:           90 bpm. Exam Location:  Inpatient Procedure: 2D Echo, Cardiac Doppler, Color Doppler and Intracardiac            Opacification Agent (Both Spectral and Color Flow Doppler were            utilized during procedure). Indications:    Bacteremia  History:        Patient has prior history of Echocardiogram examinations, most                 recent 10/21/2023. CHF; Risk Factors:Hypertension and                 Dyslipidemia.  Sonographer:    Philomena Daring Referring Phys: [id number] JULIE MACHEN  Sonographer Comments: Technically difficult study due to poor echo windows and patient is obese. Image acquisition challenging due to respiratory motion. IMPRESSIONS  1. Left ventricular ejection fraction, by estimation, is 65 to 70%. The left ventricle has normal function. The left ventricle has no regional wall motion abnormalities. There is moderate concentric left ventricular hypertrophy. Left ventricular diastolic parameters are indeterminate.  2. Right ventricular systolic function is normal. The right ventricular size is normal. There is normal pulmonary artery systolic pressure. The estimated right ventricular systolic pressure is 21.9 mmHg.  3. Left atrial size was moderately dilated.  4. The mitral valve is normal in structure. Trivial mitral valve regurgitation. No evidence of mitral stenosis.  5. The aortic valve is tricuspid. There is mild calcification of the aortic valve. Aortic valve regurgitation is trivial. Aortic valve sclerosis/calcification is present, without any evidence of aortic stenosis.  6. Aortic dilatation noted. There is borderline dilatation of the aortic root, measuring 38 mm.  7. The inferior vena cava is dilated in size with <50% respiratory variability, suggesting right atrial pressure of 15 mmHg. FINDINGS  Left Ventricle: Left ventricular ejection fraction, by estimation, is 65 to 70%. The left  ventricle has normal function. The left ventricle has no regional wall motion abnormalities. The left ventricular internal cavity size was normal in size. There is  moderate concentric left ventricular hypertrophy. Left ventricular diastolic parameters are indeterminate. Right Ventricle: The right ventricular size is normal. No increase in right ventricular wall thickness. Right ventricular systolic function is normal. There is normal pulmonary artery systolic pressure. The tricuspid regurgitant velocity is 1.31 m/s, and  with an assumed right atrial pressure of 15 mmHg, the estimated right ventricular systolic pressure is 21.9 mmHg. Left Atrium: Left atrial size was moderately dilated. Right Atrium: Right atrial size was normal in size. Pericardium: Trivial pericardial effusion is present. Mitral Valve: The mitral valve is normal in structure. Trivial mitral valve regurgitation. No evidence of mitral valve stenosis. Tricuspid Valve: The tricuspid valve is normal in structure. Tricuspid valve regurgitation is trivial. No evidence of tricuspid stenosis. Aortic Valve: The aortic valve is tricuspid. There is mild calcification of the aortic valve. Aortic valve regurgitation is trivial. Aortic valve sclerosis/calcification is present, without any evidence of aortic stenosis. Aortic valve mean gradient measures 5.0 mmHg. Aortic valve peak gradient measures 8.4 mmHg. Aortic valve area, by VTI measures 3.77 cm. Pulmonic Valve:  The pulmonic valve was not well visualized. Pulmonic valve regurgitation is not visualized. No evidence of pulmonic stenosis. Aorta: Aortic dilatation noted. There is borderline dilatation of the aortic root, measuring 38 mm. Venous: The inferior vena cava is dilated in size with less than 50% respiratory variability, suggesting right atrial pressure of 15 mmHg. IAS/Shunts: No atrial level shunt detected by color flow Doppler.  LEFT VENTRICLE PLAX 2D LVIDd:         5.70 cm   Diastology LVIDs:          4.00 cm   LV e' medial:    5.87 cm/s LV PW:         1.40 cm   LV E/e' medial:  18.6 LV IVS:        1.40 cm   LV e' lateral:   11.60 cm/s LVOT diam:     2.40 cm   LV E/e' lateral: 9.4 LV SV:         86 LV SV Index:   35 LVOT Area:     4.52 cm  IVC IVC diam: 2.40 cm LEFT ATRIUM              Index        RIGHT ATRIUM           Index LA diam:        4.80 cm  1.93 cm/m   RA Area:     21.20 cm LA Vol (A2C):   130.0 ml 52.37 ml/m  RA Volume:   55.80 ml  22.48 ml/m LA Vol (A4C):   129.0 ml 51.97 ml/m LA Biplane Vol: 134.0 ml 53.98 ml/m  AORTIC VALVE AV Area (Vmax):    4.49 cm AV Area (Vmean):   4.38 cm AV Area (VTI):     3.77 cm AV Vmax:           145.00 cm/s AV Vmean:          98.800 cm/s AV VTI:            0.228 m AV Peak Grad:      8.4 mmHg AV Mean Grad:      5.0 mmHg LVOT Vmax:         144.00 cm/s LVOT Vmean:        95.700 cm/s LVOT VTI:          0.190 m LVOT/AV VTI ratio: 0.83  AORTA Ao Root diam: 3.80 cm Ao Asc diam:  3.90 cm MITRAL VALVE                TRICUSPID VALVE MV Area (PHT): 5.06 cm     TR Peak grad:   6.9 mmHg MV Decel Time: 150 msec     TR Vmax:        131.00 cm/s MV E velocity: 109.00 cm/s                             SHUNTS                             Systemic VTI:  0.19 m                             Systemic Diam: 2.40 cm Toribio Fuel MD Electronically signed by Toribio Fuel MD Signature Date/Time: 04/28/2024/11:55:45 PM    Final  DG Chest Port 1 View Result Date: 04/27/2024 CLINICAL DATA:  Shortness of breath. EXAM: PORTABLE CHEST 1 VIEW COMPARISON:  Chest CT dated 12/19/2023. FINDINGS: Cardiomegaly with vascular congestion. Relatively similar asymmetric hazy density in the left lung may represent asymmetric edema or pneumonia. Overall improved interstitial markings since the prior radiograph. Possible small bilateral pleural effusions. No pneumothorax. No acute osseous pathology. IMPRESSION: 1. Cardiomegaly with vascular congestion and improved interstitial edema. 2. Asymmetric  edema or pneumonia in the left lung. Electronically Signed   By: Vanetta Chou M.D.   On: 04/27/2024 10:18     Discharge Instructions: Discharge Instructions     Call MD for:  difficulty breathing, headache or visual disturbances   Complete by: As directed    Call MD for:  extreme fatigue   Complete by: As directed    Call MD for:  persistant dizziness or light-headedness   Complete by: As directed    Call MD for:  persistant nausea and vomiting   Complete by: As directed    Call MD for:  temperature >100.4   Complete by: As directed    Discharge instructions   Complete by: As directed    Thank you for allowing us  to be part of your care. You were hospitalized for respiratory failure and a blood stream infection. We treated you with BiPAP and antibiotics.  See the changes in your medications and management of your chronic conditions below:  *For your lungs -I believe you ended up in the hospital due to not wearing your BiPAP at night. Please make sure to wear your BiPAP any time you are sleeping. You should also wear it when you are feeling like it is difficult to breathe.    *For your Blood Stream Infection -We have STARTED you on these following medications:  -Augmentin  (an antibiotic)--Take 1 tablet every 12 hours for 1 week.    *For your seizures -We have CHANGED the following medications:  -Phenytoin  (Dilantin )-- take 200mg  every morning and night.    -We have decreased your dose by 50mg  due to your level being high when you arrived at the hospital. Please recheck the level and increase dose back to 450mg  daily if indicated.   -Please see you PCP in 7 to 10 days  Please make sure to wear your BiPAP and take your antibiotics as prescribed!  Please call your PCP or our clinic if you have any questions or concerns, we may be able to help and keep you from a long and expensive emergency room wait. Our clinic and after hours phone number is [phone]. The best time to call  is Monday through Friday 9 am to 4 pm but there is always someone available 24/7 if you have an emergency. If you need medication refills please notify your pharmacy one week in advance and they will send us  a request.   We are glad you are feeling better,  Schuyler Novak, DO Internal Medicine Inpatient Teaching Service at [facility]   Increase activity slowly   Complete by: As directed        Signed: Novak Schuyler, DO 04/29/2024, 2:09 PM

## 2024-04-29 NOTE — Progress Notes (Signed)
 DISCHARGE NOTE [name] to be discharged Skilled nursing facility per MD order. Patient verbalized understanding.  Skin clean, dry and intact without evidence of skin break down, no evidence of skin tears noted. IV catheter discontinued intact. Site without signs and symptoms of complications. Dressing and pressure applied. Pt denies pain at the site currently. No complaints noted.  Patient free of lines, drains, and wounds.   Discharge packet assembled. An After Visit Summary (AVS) was printed and given to the EMS personnel. Patient escorted via stretcher and discharged to [facility] via ambulance. Report called to accepting facility; all questions and concerns addressed.   Peyton SHAUNNA Pepper, RN

## 2024-04-29 NOTE — Progress Notes (Signed)
 Called [facility] nursing facility with no response. Will continue to call and give report

## 2024-04-30 LAB — CULTURE, BLOOD (ROUTINE X 2)

## 2024-05-11 ENCOUNTER — Telehealth: Payer: Self-pay | Admitting: Student

## 2024-05-11 NOTE — Telephone Encounter (Signed)
 Multiple attempts have been made to reach the patient to assess his condition after hospital discharge. My colleague, Dr. Myrna, attempted to contact him three times last week, including calls to the skilled nursing facility, without success. I have also attempted to reach the patient twice this week but have been unable to make contact.  Notably, the patient was admitted for acute on chronic respiratory failure on 11/19. Blood cultures grew Staphylococcus epidermidis in one bottle and Acinetobacter calcoaceticus-baumannii complex; however, he was never septic. He was discharged on Unasyn  out of an abundance of caution. Antibiotics susceptibilities showed that this organism is resistant to this antibiotic. If the patient is not feeling well, he needs to return to the hospital for appropriate antibiotic therapy. If he remains afebrile and is clinically improving, transition to an appropriate antibiotics may not be necessary.  I have discussed with my supervising physician Dr. Lovie.

## 2024-05-12 ENCOUNTER — Telehealth: Payer: Self-pay

## 2024-05-12 NOTE — Progress Notes (Incomplete)
 New Patient Pulmonology Office Visit   Subjective:  Patient ID: James Villegas, male    DOB: 1955/10/24  MRN: [id number]  Referred by: Feliciano Devoria LABOR, MD  CC:  Chief Complaint  Patient presents with   Consult    Pt states SOB occurs w/ any activity Pt is currently on CPAP, states he using when he can get it to work DME: unknown  Pt is on 4L of 02     HPI 68 yo M PMH hypoxic and hypercarbic respiratory failure on 4-5L O2 and BiPAP, severe COPD, suspected OSA/OHS, obesity, prior CVA with residual R sided deficits in 2010, wheelchair bound, epilepsy, afib/flutter, Protein C S def, lupus anticoagulant, and chronic AC on warfarin, lives in nursing home - [facility], who was recently was admitted on 04/27/24 due to CHF, and acute on chronic hypoxemic hypercapnic respiratory failure, which improved with lasix . He has history of smoking 4-5 cigars daily for many years and quitted in 2010.   Discussed the use of AI scribe software for clinical note transcription with the patient, who gave verbal consent to proceed.  History of Present Illness He was hospitalized two weeks ago and is currently on 4 L of oxygen. He uses BiPAP nightly for about 4-5 hours but has difficulty due to poor mask fit and did not bring the device today. He has not had a sleep study to quantify sleep apnea severity.  He had a massive stroke in 2010 with right-sided paralysis and is nonambulatory in a power wheelchair. He resides in a nursing home.  Current medications relevant to his breathing include Advair inhaler, which he uses nightly. He denies a COPD diagnosis, however his PFT in 2024 showed moderate obstruction.   He reports no new shortness of breath, cough, or wheezing. He occasionally has phlegm in his throat. He has been on long-term oxygen therapy, usually 4 L and sometimes 6 L, without recent change in baseline.   ROS as above  Allergies: Fenofibrate  Current Outpatient Medications:     acetaminophen  (TYLENOL ) 325 MG tablet, Take 650 mg by mouth every 6 (six) hours as needed for mild pain, headache or fever., Disp: , Rfl:    antiseptic oral rinse (BIOTENE) LIQD, 1 Application by Mouth Rinse route at bedtime., Disp: , Rfl:    ARTIFICIAL TEAR OP, Place 1 drop into both eyes in the morning, at noon, in the evening, and at bedtime., Disp: , Rfl:    Baclofen  5 MG TABS, Take 5 mg by mouth at bedtime., Disp: , Rfl:    bisacodyl  (DULCOLAX) 5 MG EC tablet, Take 10 mg by mouth daily as needed for moderate constipation., Disp: , Rfl:    Carboxymeth-Glycerin -Polysorb (REFRESH OPTIVE ADVANCED) 0.5-1-0.5 % SOLN, Place 1 drop into both eyes in the morning, at noon, in the evening, and at bedtime., Disp: , Rfl:    cycloSPORINE  (RESTASIS ) 0.05 % ophthalmic emulsion, Place 1 drop into both eyes 2 (two) times daily., Disp: , Rfl:    Dentifrices (BIOTENE DRY MOUTH GENTLE DT), Place 1 Application onto teeth at bedtime., Disp: , Rfl:    DULoxetine  (CYMBALTA ) 30 MG capsule, Take 30 mg by mouth in the morning., Disp: , Rfl:    empagliflozin  (JARDIANCE ) 10 MG TABS tablet, Take 10 mg by mouth daily., Disp: , Rfl:    ezetimibe  (ZETIA ) 10 MG tablet, Take 10 mg by mouth daily., Disp: , Rfl:    fluticasone -salmeterol (ADVAIR) 250-50 MCG/ACT AEPB, Inhale 1 puff into the lungs in the  morning and at bedtime., Disp: , Rfl:    guaiFENesin  (ROBITUSSIN) 100 MG/5ML liquid, Take 15 mLs by mouth every 4 (four) hours as needed for cough., Disp: , Rfl:    ipratropium-albuterol  (DUONEB) 0.5-2.5 (3) MG/3ML SOLN, Take 3 mLs by nebulization every 4 (four) hours as needed., Disp: , Rfl:    nystatin  (MYCOSTATIN /NYSTOP ) powder, Apply topically 3 (three) times daily. (Patient taking differently: Apply 1 Application topically 3 (three) times daily. Right neck crease), Disp: 1 g, Rfl:    omeprazole  (PRILOSEC) 20 MG capsule, Take 20 mg by mouth every other day., Disp: , Rfl:    OXYGEN, Inhale 4 L into the lungs continuous., Disp: ,  Rfl:    phenytoin  (DILANTIN ) 100 MG ER capsule, Take 200 mg by mouth 2 (two) times daily., Disp: , Rfl:    [Paused] phenytoin  (DILANTIN ) 50 MG tablet, Chew 1 tablet (50 mg total) by mouth at bedtime., Disp: , Rfl:    rosuvastatin  (CRESTOR ) 40 MG tablet, Take 40 mg by mouth at bedtime., Disp: , Rfl:    senna-docusate (SENOKOT-S) 8.6-50 MG tablet, Take 1 tablet by mouth at bedtime as needed for mild constipation. (Patient taking differently: Take 1 tablet by mouth daily as needed for mild constipation.), Disp: , Rfl:    Torsemide  40 MG TABS, Take 40 mg by mouth in the morning and at bedtime., Disp: , Rfl:    [Paused] traZODone  (DESYREL ) 50 MG tablet, Take 50 mg by mouth at bedtime., Disp: , Rfl:    warfarin (COUMADIN ) 4 MG tablet, Take 4 mg by mouth every evening., Disp: , Rfl:    ZINC OXIDE EX, Apply 1 Application topically in the morning and at bedtime. Apply to buttocks topically every day shift and night shift, Disp: , Rfl:  Past Medical History:  Diagnosis Date   Acute on chronic congestive heart failure (HCC)    Anemia, secondary    SECONDARY TO ACUTE BLOOD LOSS   Anxiety disorder    Bloody stool 08/29/2011   intermittent along with constipation.    CVA (cerebral vascular accident) (HCC) 2010   large left MCA stroke with right hemiparesis   Depression    DVT of lower extremity (deep venous thrombosis) (HCC)    RIGHT LOWER; s/p IVC filter 7/12   Dysphagia    Hyperlipidemia    Hypertension    Lupus anticoagulant disorder 1990   Morbid obesity (HCC)    PFO (patent foramen ovale)    TEE 2/10: EF 60%, trivial AI, mild Ao root dilatation, mod PFO with R-L shunting, atrial septal aneurysm;   echo 7/12: EF 65-70%, grade 1 diast dysfxn, mild MR, LVOT showed severe obstruction   Protein C deficiency    Protein S deficiency    Rectus sheath hematoma 7/12   required reversal of anticoagulation and c/b DVT req. IVC filter   Seizure disorder [facility])    Past Surgical History:  Procedure  Laterality Date   dental extraction     multiple   vena cavogram  12/2010   INFERIOR   Family History  Problem Relation Age of Onset   Sleep apnea Sister    Social History   Socioeconomic History   Marital status: Single    Spouse name: Not on file   Number of children: Not on file   Years of education: Not on file   Highest education level: Not on file  Occupational History   Not on file  Tobacco Use   Smoking status: Former  Current packs/day: 0.00    Average packs/day: 1 pack/day for 15.0 years (15.0 ttl pk-yrs)    Types: Cigarettes, Cigars    Start date: 88    Quit date: 2010    Years since quitting: 15.9   Smokeless tobacco: Never  Vaping Use   Vaping status: Never Used  Substance and Sexual Activity   Alcohol  use: No    Comment: 10 years Sober    Drug use: No   Sexual activity: Not on file  Other Topics Concern   Not on file  Social History Narrative   Not on file   Social Drivers of Health   Financial Resource Strain: Not on file  Food Insecurity: Patient Unable To Answer (12/20/2023)   Hunger Vital Sign    Worried About Running Out of Food in the Last Year: Patient unable to answer    Ran Out of Food in the Last Year: Patient unable to answer  Transportation Needs: Patient Unable To Answer (12/20/2023)   PRAPARE - Transportation    Lack of Transportation (Medical): Patient unable to answer    Lack of Transportation (Non-Medical): Patient unable to answer  Physical Activity: Not on file  Stress: Not on file  Social Connections: Unknown (12/20/2023)   Social Connection and Isolation Panel    Frequency of Communication with Friends and Family: Patient unable to answer    Frequency of Social Gatherings with Friends and Family: Patient unable to answer    Attends Religious Services: Patient unable to answer    Active Member of Clubs or Organizations: Not on file    Attends Banker Meetings: Patient unable to answer    Marital Status:  Patient unable to answer  Intimate Partner Violence: Patient Unable To Answer (12/20/2023)   Humiliation, Afraid, Rape, and Kick questionnaire    Fear of Current or Ex-Partner: Patient unable to answer    Emotionally Abused: Patient unable to answer    Physically Abused: Patient unable to answer    Sexually Abused: Patient unable to answer       Objective:  There were no vitals taken for this visit. Wt Readings from Last 3 Encounters:  05/13/24 287 lb (130.2 kg)  04/29/24 287 lb 14.7 oz (130.6 kg)  12/23/23 279 lb 15.8 oz (127 kg)   BMI Readings from Last 3 Encounters:  05/13/24 37.87 kg/m  04/29/24 37.99 kg/m  12/23/23 36.94 kg/m   SpO2 Readings from Last 3 Encounters:  05/13/24 92%  04/29/24 94%  12/23/23 94%    Physical Exam Constitutional:      Appearance: He is obese.     Comments: Chronically ill appearance   HENT:     Head: Normocephalic.  Eyes:     Pupils: Pupils are equal, round, and reactive to light.  Cardiovascular:     Rate and Rhythm: Normal rate and regular rhythm.  Pulmonary:     Effort: Pulmonary effort is normal.     Breath sounds: Normal breath sounds.  Neurological:     Mental Status: He is alert.     Comments: Able to answer questions. Right sided hemiplegia. Left side able to move UE and LE. Wheelchair bound.       Assessment & Plan:   Assessment & Plan OSA/OHS with chronic hypoxic hypercarbic respiratory failure on Bipap Highly possible OSA/OHS - no sleep study on records with chronic respiratory failure and hypoxia. BiPAP used nightly 4-5 hours.  Pulmonary function tests show moderate airway obstruction, possibly linked to sleep apnea  and COPD. - Ordered sleep study to assess sleep apnea severity and guide BiPAP management. - Continue BiPAP therapy. - Depend on PSG, I will consider sending a referral to sleep medicine doctor.  Chronic obstructive pulmonary disease (COPD) COPD with moderate obstruction on PFTs. Smoking history noted,  quitted in 2010. Advair inhaler use, considering Breztri  for improved management. Denied cough, mucus production, wheezing or other symptoms. Denied dyspnea, although he is wheelchair bound.  - Discontinued Advair inhaler. - Initiated Breztri  inhaler, two puffs morning and night. - Instructed to rinse mouth after Breztri  use to prevent infection. - Sent prescription to pharmacy.  I spent 45 minutes in this encounter and reviewing records.  Return in about 6 months (around 11/11/2024).    Marny Patch, MD Pulmonary and Critical Care Medicine [facility]

## 2024-05-12 NOTE — Telephone Encounter (Signed)
 New message   05/12/24: LVM to contact the office R/s appt due to weather  mychart pending

## 2024-05-13 ENCOUNTER — Ambulatory Visit

## 2024-05-13 VITALS — BP 98/60 | HR 88 | Temp 97.8°F | Ht 73.0 in | Wt 287.0 lb

## 2024-05-13 DIAGNOSIS — J9611 Chronic respiratory failure with hypoxia: Secondary | ICD-10-CM

## 2024-05-13 DIAGNOSIS — I69351 Hemiplegia and hemiparesis following cerebral infarction affecting right dominant side: Secondary | ICD-10-CM

## 2024-05-13 DIAGNOSIS — G4733 Obstructive sleep apnea (adult) (pediatric): Secondary | ICD-10-CM

## 2024-05-13 DIAGNOSIS — J9612 Chronic respiratory failure with hypercapnia: Secondary | ICD-10-CM | POA: Diagnosis not present

## 2024-05-13 DIAGNOSIS — J449 Chronic obstructive pulmonary disease, unspecified: Secondary | ICD-10-CM | POA: Diagnosis not present

## 2024-05-13 MED ORDER — BREZTRI AEROSPHERE 160-9-4.8 MCG/ACT IN AERO: 2.0000 | INHALATION_SPRAY | Freq: Two times a day (BID) | RESPIRATORY_TRACT | 6 refills | Status: AC

## 2024-05-13 NOTE — Patient Instructions (Addendum)
 Dear James Villegas;  I recommend to obtain a sleep study to get more information of the severity of the sleep apnea. I order one to be done at [facility].  Given your COPD, I recommend to change your inhaler to Breztri  2 puffs twice a day. Rinse your mouth after each use.  Please stop Advair after you get the new inhaler.   Continue the same oxygen 4L continuously.  Continue to use the bipap every night.   I will see you in 6 months.

## 2024-07-25 ENCOUNTER — Ambulatory Visit (HOSPITAL_BASED_OUTPATIENT_CLINIC_OR_DEPARTMENT_OTHER): Admitting: Pulmonary Disease

## 2024-08-29 ENCOUNTER — Ambulatory Visit
